# Patient Record
Sex: Female | Born: 1940 | Race: Black or African American | Hispanic: No | Marital: Single | Smoking: Former smoker
Health system: Southern US, Community
[De-identification: ages and names within clinical notes are randomized; demographics above are authoritative.]

## PROBLEM LIST (undated history)

## (undated) DIAGNOSIS — E119 Type 2 diabetes mellitus without complications: Secondary | ICD-10-CM

## (undated) DIAGNOSIS — K219 Gastro-esophageal reflux disease without esophagitis: Secondary | ICD-10-CM

## (undated) DIAGNOSIS — I998 Other disorder of circulatory system: Secondary | ICD-10-CM

## (undated) DIAGNOSIS — E039 Hypothyroidism, unspecified: Secondary | ICD-10-CM

## (undated) DIAGNOSIS — G2581 Restless legs syndrome: Secondary | ICD-10-CM

## (undated) DIAGNOSIS — R002 Palpitations: Secondary | ICD-10-CM

## (undated) DIAGNOSIS — M109 Gout, unspecified: Secondary | ICD-10-CM

## (undated) DIAGNOSIS — I70229 Atherosclerosis of native arteries of extremities with rest pain, unspecified extremity: Secondary | ICD-10-CM

## (undated) DIAGNOSIS — F17200 Nicotine dependence, unspecified, uncomplicated: Secondary | ICD-10-CM

## (undated) DIAGNOSIS — M949 Disorder of cartilage, unspecified: Secondary | ICD-10-CM

## (undated) DIAGNOSIS — I10 Essential (primary) hypertension: Secondary | ICD-10-CM

## (undated) DIAGNOSIS — D649 Anemia, unspecified: Secondary | ICD-10-CM

## (undated) DIAGNOSIS — Z86010 Personal history of colonic polyps: Secondary | ICD-10-CM

## (undated) DIAGNOSIS — G8929 Other chronic pain: Secondary | ICD-10-CM

## (undated) DIAGNOSIS — M545 Low back pain, unspecified: Secondary | ICD-10-CM

## (undated) DIAGNOSIS — M542 Cervicalgia: Secondary | ICD-10-CM

## (undated) DIAGNOSIS — J3800 Paralysis of vocal cords and larynx, unspecified: Secondary | ICD-10-CM

## (undated) DIAGNOSIS — T4145XA Adverse effect of unspecified anesthetic, initial encounter: Secondary | ICD-10-CM

## (undated) DIAGNOSIS — F329 Major depressive disorder, single episode, unspecified: Secondary | ICD-10-CM

## (undated) DIAGNOSIS — R413 Other amnesia: Secondary | ICD-10-CM

## (undated) DIAGNOSIS — I739 Peripheral vascular disease, unspecified: Secondary | ICD-10-CM

## (undated) DIAGNOSIS — Z992 Dependence on renal dialysis: Secondary | ICD-10-CM

## (undated) DIAGNOSIS — R569 Unspecified convulsions: Secondary | ICD-10-CM

## (undated) DIAGNOSIS — N186 End stage renal disease: Secondary | ICD-10-CM

## (undated) DIAGNOSIS — T8859XA Other complications of anesthesia, initial encounter: Secondary | ICD-10-CM

## (undated) DIAGNOSIS — N289 Disorder of kidney and ureter, unspecified: Secondary | ICD-10-CM

## (undated) DIAGNOSIS — J42 Unspecified chronic bronchitis: Secondary | ICD-10-CM

## (undated) DIAGNOSIS — F411 Generalized anxiety disorder: Secondary | ICD-10-CM

## (undated) DIAGNOSIS — E785 Hyperlipidemia, unspecified: Secondary | ICD-10-CM

## (undated) DIAGNOSIS — R011 Cardiac murmur, unspecified: Secondary | ICD-10-CM

## (undated) DIAGNOSIS — K279 Peptic ulcer, site unspecified, unspecified as acute or chronic, without hemorrhage or perforation: Secondary | ICD-10-CM

## (undated) DIAGNOSIS — M5430 Sciatica, unspecified side: Principal | ICD-10-CM

## (undated) DIAGNOSIS — G609 Hereditary and idiopathic neuropathy, unspecified: Secondary | ICD-10-CM

## (undated) DIAGNOSIS — C649 Malignant neoplasm of unspecified kidney, except renal pelvis: Secondary | ICD-10-CM

## (undated) DIAGNOSIS — G43009 Migraine without aura, not intractable, without status migrainosus: Secondary | ICD-10-CM

## (undated) DIAGNOSIS — M899 Disorder of bone, unspecified: Secondary | ICD-10-CM

## (undated) DIAGNOSIS — M1990 Unspecified osteoarthritis, unspecified site: Secondary | ICD-10-CM

## (undated) HISTORY — DX: Nicotine dependence, unspecified, uncomplicated: F17.200

## (undated) HISTORY — DX: Hyperlipidemia, unspecified: E78.5

## (undated) HISTORY — DX: Cervicalgia: M54.2

## (undated) HISTORY — DX: Hypothyroidism, unspecified: E03.9

## (undated) HISTORY — PX: LAPAROSCOPIC CHOLECYSTECTOMY: SUR755

## (undated) HISTORY — DX: Gout, unspecified: M10.9

## (undated) HISTORY — DX: Major depressive disorder, single episode, unspecified: F32.9

## (undated) HISTORY — PX: SHOULDER OPEN ROTATOR CUFF REPAIR: SHX2407

## (undated) HISTORY — PX: CATARACT EXTRACTION, BILATERAL: SHX1313

## (undated) HISTORY — DX: Palpitations: R00.2

## (undated) HISTORY — DX: Generalized anxiety disorder: F41.1

## (undated) HISTORY — DX: Migraine without aura, not intractable, without status migrainosus: G43.009

## (undated) HISTORY — DX: Other disorder of circulatory system: I99.8

## (undated) HISTORY — DX: Other chronic pain: G89.29

## (undated) HISTORY — DX: Personal history of colonic polyps: Z86.010

## (undated) HISTORY — DX: Malignant neoplasm of unspecified kidney, except renal pelvis: C64.9

## (undated) HISTORY — DX: Disorder of cartilage, unspecified: M94.9

## (undated) HISTORY — DX: Peptic ulcer, site unspecified, unspecified as acute or chronic, without hemorrhage or perforation: K27.9

## (undated) HISTORY — DX: Disorder of bone, unspecified: M89.9

## (undated) HISTORY — DX: Peripheral vascular disease, unspecified: I73.9

## (undated) HISTORY — DX: Unspecified convulsions: R56.9

## (undated) HISTORY — DX: Restless legs syndrome: G25.81

## (undated) HISTORY — DX: Other amnesia: R41.3

## (undated) HISTORY — DX: Hereditary and idiopathic neuropathy, unspecified: G60.9

## (undated) HISTORY — DX: Anemia, unspecified: D64.9

## (undated) HISTORY — DX: Sciatica, unspecified side: M54.30

## (undated) HISTORY — DX: Atherosclerosis of native arteries of extremities with rest pain, unspecified extremity: I70.229

## (undated) HISTORY — DX: Essential (primary) hypertension: I10

---

## 1978-08-06 HISTORY — PX: BUNIONECTOMY: SHX129

## 1994-08-06 DIAGNOSIS — J3800 Paralysis of vocal cords and larynx, unspecified: Secondary | ICD-10-CM

## 1994-08-06 HISTORY — DX: Paralysis of vocal cords and larynx, unspecified: J38.00

## 1995-08-07 HISTORY — PX: THYROID SURGERY: SHX805

## 1997-12-04 ENCOUNTER — Inpatient Hospital Stay (HOSPITAL_COMMUNITY): Admission: EM | Admit: 1997-12-04 | Discharge: 1997-12-10 | Payer: Self-pay | Admitting: Emergency Medicine

## 1998-01-05 ENCOUNTER — Encounter: Admission: RE | Admit: 1998-01-05 | Discharge: 1998-04-05 | Payer: Self-pay | Admitting: Cardiology

## 1998-02-14 ENCOUNTER — Emergency Department (HOSPITAL_COMMUNITY): Admission: EM | Admit: 1998-02-14 | Discharge: 1998-02-14 | Payer: Self-pay | Admitting: Emergency Medicine

## 1998-04-18 ENCOUNTER — Inpatient Hospital Stay (HOSPITAL_COMMUNITY): Admission: EM | Admit: 1998-04-18 | Discharge: 1998-04-24 | Payer: Self-pay | Admitting: *Deleted

## 1998-04-18 ENCOUNTER — Encounter: Payer: Self-pay | Admitting: *Deleted

## 1998-04-19 ENCOUNTER — Encounter: Payer: Self-pay | Admitting: Cardiology

## 1998-05-02 ENCOUNTER — Encounter: Payer: Self-pay | Admitting: Cardiology

## 1998-05-03 ENCOUNTER — Inpatient Hospital Stay (HOSPITAL_COMMUNITY): Admission: RE | Admit: 1998-05-03 | Discharge: 1998-05-06 | Payer: Self-pay | Admitting: Cardiology

## 1998-05-19 ENCOUNTER — Ambulatory Visit (HOSPITAL_COMMUNITY): Admission: RE | Admit: 1998-05-19 | Discharge: 1998-05-20 | Payer: Self-pay | Admitting: General Surgery

## 1998-11-28 ENCOUNTER — Emergency Department (HOSPITAL_COMMUNITY): Admission: EM | Admit: 1998-11-28 | Discharge: 1998-11-28 | Payer: Self-pay | Admitting: *Deleted

## 1998-11-29 ENCOUNTER — Encounter: Payer: Self-pay | Admitting: *Deleted

## 1999-04-22 ENCOUNTER — Encounter: Payer: Self-pay | Admitting: Cardiology

## 1999-04-22 ENCOUNTER — Ambulatory Visit (HOSPITAL_COMMUNITY): Admission: RE | Admit: 1999-04-22 | Discharge: 1999-04-22 | Payer: Self-pay | Admitting: Cardiology

## 1999-05-18 ENCOUNTER — Ambulatory Visit (HOSPITAL_BASED_OUTPATIENT_CLINIC_OR_DEPARTMENT_OTHER): Admission: RE | Admit: 1999-05-18 | Discharge: 1999-05-18 | Payer: Self-pay | Admitting: Orthopedic Surgery

## 1999-05-18 ENCOUNTER — Encounter: Payer: Self-pay | Admitting: Orthopedic Surgery

## 1999-05-19 ENCOUNTER — Inpatient Hospital Stay (HOSPITAL_COMMUNITY): Admission: RE | Admit: 1999-05-19 | Discharge: 1999-05-21 | Payer: Self-pay | Admitting: Orthopedic Surgery

## 1999-06-20 ENCOUNTER — Encounter: Admission: RE | Admit: 1999-06-20 | Discharge: 1999-08-28 | Payer: Self-pay | Admitting: Orthopedic Surgery

## 2000-08-10 ENCOUNTER — Ambulatory Visit (HOSPITAL_COMMUNITY): Admission: RE | Admit: 2000-08-10 | Discharge: 2000-08-10 | Payer: Self-pay | Admitting: Orthopedic Surgery

## 2000-08-10 ENCOUNTER — Encounter: Payer: Self-pay | Admitting: Orthopedic Surgery

## 2000-09-10 ENCOUNTER — Encounter: Admission: RE | Admit: 2000-09-10 | Discharge: 2000-09-30 | Payer: Self-pay | Admitting: Orthopedic Surgery

## 2000-12-17 ENCOUNTER — Encounter: Admission: RE | Admit: 2000-12-17 | Discharge: 2001-03-05 | Payer: Self-pay | Admitting: Orthopedic Surgery

## 2001-01-08 ENCOUNTER — Encounter: Payer: Self-pay | Admitting: *Deleted

## 2001-01-08 ENCOUNTER — Encounter: Admission: RE | Admit: 2001-01-08 | Discharge: 2001-01-08 | Payer: Self-pay | Admitting: *Deleted

## 2001-07-09 ENCOUNTER — Encounter: Admission: RE | Admit: 2001-07-09 | Discharge: 2001-10-07 | Payer: Self-pay | Admitting: Internal Medicine

## 2001-12-01 ENCOUNTER — Encounter (HOSPITAL_BASED_OUTPATIENT_CLINIC_OR_DEPARTMENT_OTHER): Admission: RE | Admit: 2001-12-01 | Discharge: 2001-12-05 | Payer: Self-pay | Admitting: Orthopedic Surgery

## 2002-05-05 ENCOUNTER — Encounter: Admission: RE | Admit: 2002-05-05 | Discharge: 2002-05-05 | Payer: Self-pay | Admitting: Cardiology

## 2002-05-05 ENCOUNTER — Encounter: Payer: Self-pay | Admitting: Cardiology

## 2002-05-26 ENCOUNTER — Encounter (HOSPITAL_BASED_OUTPATIENT_CLINIC_OR_DEPARTMENT_OTHER): Admission: RE | Admit: 2002-05-26 | Discharge: 2002-08-24 | Payer: Self-pay | Admitting: Internal Medicine

## 2002-08-17 ENCOUNTER — Encounter: Admission: RE | Admit: 2002-08-17 | Discharge: 2002-11-15 | Payer: Self-pay

## 2002-11-16 ENCOUNTER — Encounter (HOSPITAL_BASED_OUTPATIENT_CLINIC_OR_DEPARTMENT_OTHER): Admission: RE | Admit: 2002-11-16 | Discharge: 2003-02-14 | Payer: Self-pay | Admitting: Internal Medicine

## 2003-01-12 ENCOUNTER — Emergency Department (HOSPITAL_COMMUNITY): Admission: EM | Admit: 2003-01-12 | Discharge: 2003-01-12 | Payer: Self-pay

## 2004-01-07 ENCOUNTER — Encounter (HOSPITAL_BASED_OUTPATIENT_CLINIC_OR_DEPARTMENT_OTHER): Admission: RE | Admit: 2004-01-07 | Discharge: 2004-04-06 | Payer: Self-pay | Admitting: Internal Medicine

## 2004-01-18 ENCOUNTER — Ambulatory Visit (HOSPITAL_COMMUNITY): Admission: RE | Admit: 2004-01-18 | Discharge: 2004-01-18 | Payer: Self-pay | Admitting: *Deleted

## 2004-01-21 ENCOUNTER — Ambulatory Visit (HOSPITAL_COMMUNITY): Admission: RE | Admit: 2004-01-21 | Discharge: 2004-01-21 | Payer: Self-pay | Admitting: *Deleted

## 2004-02-22 ENCOUNTER — Encounter: Admission: RE | Admit: 2004-02-22 | Discharge: 2004-02-22 | Payer: Self-pay | Admitting: *Deleted

## 2004-06-07 ENCOUNTER — Encounter: Admission: RE | Admit: 2004-06-07 | Discharge: 2004-06-07 | Payer: Self-pay | Admitting: Cardiology

## 2004-08-06 HISTORY — PX: TOE AMPUTATION: SHX809

## 2004-08-09 ENCOUNTER — Encounter (HOSPITAL_BASED_OUTPATIENT_CLINIC_OR_DEPARTMENT_OTHER): Admission: RE | Admit: 2004-08-09 | Discharge: 2004-08-29 | Payer: Self-pay | Admitting: Internal Medicine

## 2004-09-25 ENCOUNTER — Encounter: Admission: RE | Admit: 2004-09-25 | Discharge: 2004-11-27 | Payer: Self-pay | Admitting: Unknown Physician Specialty

## 2004-12-20 ENCOUNTER — Encounter (HOSPITAL_BASED_OUTPATIENT_CLINIC_OR_DEPARTMENT_OTHER): Admission: RE | Admit: 2004-12-20 | Discharge: 2005-03-13 | Payer: Self-pay | Admitting: Surgery

## 2005-01-04 ENCOUNTER — Encounter: Payer: Self-pay | Admitting: Internal Medicine

## 2005-04-19 ENCOUNTER — Other Ambulatory Visit: Admission: RE | Admit: 2005-04-19 | Discharge: 2005-04-19 | Payer: Self-pay | Admitting: Obstetrics and Gynecology

## 2005-05-08 ENCOUNTER — Inpatient Hospital Stay (HOSPITAL_COMMUNITY): Admission: AD | Admit: 2005-05-08 | Discharge: 2005-05-23 | Payer: Self-pay | Admitting: Cardiology

## 2005-05-15 ENCOUNTER — Encounter (INDEPENDENT_AMBULATORY_CARE_PROVIDER_SITE_OTHER): Payer: Self-pay | Admitting: Cardiovascular Disease

## 2005-05-23 ENCOUNTER — Inpatient Hospital Stay
Admission: RE | Admit: 2005-05-23 | Discharge: 2005-05-31 | Payer: Self-pay | Admitting: Physical Medicine & Rehabilitation

## 2005-08-16 ENCOUNTER — Ambulatory Visit (HOSPITAL_COMMUNITY): Admission: RE | Admit: 2005-08-16 | Discharge: 2005-08-16 | Payer: Self-pay | Admitting: Cardiology

## 2005-08-30 ENCOUNTER — Encounter: Admission: RE | Admit: 2005-08-30 | Discharge: 2005-08-30 | Payer: Self-pay | Admitting: Cardiology

## 2005-09-20 ENCOUNTER — Ambulatory Visit: Payer: Self-pay | Admitting: Internal Medicine

## 2005-10-04 ENCOUNTER — Ambulatory Visit (HOSPITAL_COMMUNITY): Admission: RE | Admit: 2005-10-04 | Discharge: 2005-10-04 | Payer: Self-pay | Admitting: Gastroenterology

## 2005-10-16 ENCOUNTER — Ambulatory Visit: Payer: Self-pay | Admitting: Internal Medicine

## 2005-11-21 ENCOUNTER — Ambulatory Visit: Payer: Self-pay | Admitting: Internal Medicine

## 2005-11-24 ENCOUNTER — Inpatient Hospital Stay (HOSPITAL_COMMUNITY): Admission: EM | Admit: 2005-11-24 | Discharge: 2005-11-28 | Payer: Self-pay | Admitting: Internal Medicine

## 2005-11-24 ENCOUNTER — Ambulatory Visit: Payer: Self-pay | Admitting: Internal Medicine

## 2005-12-04 ENCOUNTER — Ambulatory Visit: Payer: Self-pay | Admitting: Internal Medicine

## 2005-12-05 ENCOUNTER — Encounter: Payer: Self-pay | Admitting: Cardiology

## 2005-12-05 ENCOUNTER — Encounter (HOSPITAL_BASED_OUTPATIENT_CLINIC_OR_DEPARTMENT_OTHER): Payer: Self-pay | Admitting: General Surgery

## 2006-01-05 ENCOUNTER — Ambulatory Visit: Payer: Self-pay | Admitting: Family Medicine

## 2006-01-07 ENCOUNTER — Ambulatory Visit: Payer: Self-pay | Admitting: Internal Medicine

## 2006-01-10 ENCOUNTER — Ambulatory Visit: Payer: Self-pay | Admitting: Internal Medicine

## 2006-02-19 ENCOUNTER — Ambulatory Visit: Payer: Self-pay | Admitting: Internal Medicine

## 2006-02-24 ENCOUNTER — Encounter: Admission: RE | Admit: 2006-02-24 | Discharge: 2006-02-24 | Payer: Self-pay | Admitting: Internal Medicine

## 2006-03-19 ENCOUNTER — Ambulatory Visit: Payer: Self-pay | Admitting: Internal Medicine

## 2006-04-12 ENCOUNTER — Encounter (HOSPITAL_BASED_OUTPATIENT_CLINIC_OR_DEPARTMENT_OTHER): Admission: RE | Admit: 2006-04-12 | Discharge: 2006-05-02 | Payer: Self-pay | Admitting: Internal Medicine

## 2006-05-20 ENCOUNTER — Ambulatory Visit: Payer: Self-pay | Admitting: Internal Medicine

## 2006-05-27 ENCOUNTER — Other Ambulatory Visit: Admission: RE | Admit: 2006-05-27 | Discharge: 2006-05-27 | Payer: Self-pay | Admitting: Obstetrics and Gynecology

## 2006-05-29 ENCOUNTER — Ambulatory Visit: Payer: Self-pay | Admitting: Cardiology

## 2006-05-29 HISTORY — PX: ELECTROCARDIOGRAM: SHX264

## 2006-06-03 ENCOUNTER — Ambulatory Visit: Payer: Self-pay | Admitting: Cardiology

## 2006-06-11 ENCOUNTER — Encounter: Admission: RE | Admit: 2006-06-11 | Discharge: 2006-06-11 | Payer: Self-pay | Admitting: Nephrology

## 2006-06-12 ENCOUNTER — Ambulatory Visit: Payer: Self-pay | Admitting: Internal Medicine

## 2006-06-14 ENCOUNTER — Encounter: Admission: RE | Admit: 2006-06-14 | Discharge: 2006-06-14 | Payer: Self-pay | Admitting: Internal Medicine

## 2006-06-18 ENCOUNTER — Ambulatory Visit: Payer: Self-pay

## 2006-06-18 ENCOUNTER — Encounter: Payer: Self-pay | Admitting: Internal Medicine

## 2006-06-18 HISTORY — PX: OTHER SURGICAL HISTORY: SHX169

## 2006-07-03 ENCOUNTER — Ambulatory Visit: Payer: Self-pay | Admitting: Cardiology

## 2006-10-16 ENCOUNTER — Ambulatory Visit: Payer: Self-pay | Admitting: Internal Medicine

## 2006-10-16 LAB — CONVERTED CEMR LAB
ALT: 22 units/L (ref 0–40)
AST: 25 units/L (ref 0–37)
Bilirubin, Direct: 0.1 mg/dL (ref 0.0–0.3)
CO2: 32 meq/L (ref 19–32)
Calcium: 9.5 mg/dL (ref 8.4–10.5)
Chloride: 93 meq/L — ABNORMAL LOW (ref 96–112)
Eosinophils Absolute: 0.1 10*3/uL (ref 0.0–0.6)
Eosinophils Relative: 2.1 % (ref 0.0–5.0)
Glucose, Bld: 98 mg/dL (ref 70–99)
HCT: 36.7 % (ref 36.0–46.0)
Hemoglobin, Urine: NEGATIVE
Hemoglobin: 12.5 g/dL (ref 12.0–15.0)
MCV: 92.5 fL (ref 78.0–100.0)
Mucus, UA: NEGATIVE
Neutrophils Relative %: 47.8 % (ref 43.0–77.0)
RBC: 3.97 M/uL (ref 3.87–5.11)
Total Protein, Urine: NEGATIVE mg/dL
Total Protein: 7.4 g/dL (ref 6.0–8.3)
Triglycerides: 273 mg/dL (ref 0–149)
WBC: 6.9 10*3/uL (ref 4.5–10.5)

## 2006-10-24 ENCOUNTER — Ambulatory Visit: Payer: Self-pay | Admitting: Internal Medicine

## 2006-12-12 ENCOUNTER — Ambulatory Visit: Payer: Self-pay | Admitting: Internal Medicine

## 2007-01-23 ENCOUNTER — Inpatient Hospital Stay (HOSPITAL_COMMUNITY): Admission: RE | Admit: 2007-01-23 | Discharge: 2007-01-28 | Payer: Self-pay | Admitting: Orthopedic Surgery

## 2007-01-23 ENCOUNTER — Encounter (INDEPENDENT_AMBULATORY_CARE_PROVIDER_SITE_OTHER): Payer: Self-pay | Admitting: Orthopedic Surgery

## 2007-01-24 ENCOUNTER — Ambulatory Visit: Payer: Self-pay | Admitting: Physical Medicine & Rehabilitation

## 2007-02-17 ENCOUNTER — Encounter: Payer: Self-pay | Admitting: Internal Medicine

## 2007-02-17 DIAGNOSIS — F3289 Other specified depressive episodes: Secondary | ICD-10-CM

## 2007-02-17 DIAGNOSIS — E039 Hypothyroidism, unspecified: Secondary | ICD-10-CM

## 2007-02-17 DIAGNOSIS — R7302 Impaired glucose tolerance (oral): Secondary | ICD-10-CM

## 2007-02-17 DIAGNOSIS — N186 End stage renal disease: Secondary | ICD-10-CM

## 2007-02-17 DIAGNOSIS — E119 Type 2 diabetes mellitus without complications: Secondary | ICD-10-CM

## 2007-02-17 DIAGNOSIS — J309 Allergic rhinitis, unspecified: Secondary | ICD-10-CM | POA: Insufficient documentation

## 2007-02-17 DIAGNOSIS — F329 Major depressive disorder, single episode, unspecified: Secondary | ICD-10-CM

## 2007-02-17 DIAGNOSIS — R569 Unspecified convulsions: Secondary | ICD-10-CM

## 2007-02-17 DIAGNOSIS — K219 Gastro-esophageal reflux disease without esophagitis: Secondary | ICD-10-CM

## 2007-02-17 DIAGNOSIS — I10 Essential (primary) hypertension: Secondary | ICD-10-CM

## 2007-02-17 DIAGNOSIS — E785 Hyperlipidemia, unspecified: Secondary | ICD-10-CM

## 2007-02-17 DIAGNOSIS — N259 Disorder resulting from impaired renal tubular function, unspecified: Secondary | ICD-10-CM | POA: Insufficient documentation

## 2007-02-17 DIAGNOSIS — I739 Peripheral vascular disease, unspecified: Secondary | ICD-10-CM

## 2007-02-17 DIAGNOSIS — Z992 Dependence on renal dialysis: Secondary | ICD-10-CM

## 2007-02-17 HISTORY — DX: Dependence on renal dialysis: N18.6

## 2007-02-17 HISTORY — DX: Major depressive disorder, single episode, unspecified: F32.9

## 2007-02-17 HISTORY — DX: Unspecified convulsions: R56.9

## 2007-02-17 HISTORY — DX: Hyperlipidemia, unspecified: E78.5

## 2007-02-17 HISTORY — DX: Essential (primary) hypertension: I10

## 2007-02-17 HISTORY — DX: Other specified depressive episodes: F32.89

## 2007-02-17 HISTORY — DX: Hypothyroidism, unspecified: E03.9

## 2007-02-17 HISTORY — DX: Peripheral vascular disease, unspecified: I73.9

## 2007-02-17 HISTORY — DX: End stage renal disease: Z99.2

## 2007-02-17 HISTORY — DX: Type 2 diabetes mellitus without complications: E11.9

## 2007-02-26 ENCOUNTER — Ambulatory Visit: Payer: Self-pay | Admitting: Internal Medicine

## 2007-02-26 ENCOUNTER — Inpatient Hospital Stay (HOSPITAL_COMMUNITY): Admission: EM | Admit: 2007-02-26 | Discharge: 2007-03-02 | Payer: Self-pay | Admitting: *Deleted

## 2007-02-27 ENCOUNTER — Ambulatory Visit: Payer: Self-pay | Admitting: Internal Medicine

## 2007-02-28 ENCOUNTER — Ambulatory Visit: Payer: Self-pay | Admitting: Vascular Surgery

## 2007-02-28 ENCOUNTER — Encounter: Payer: Self-pay | Admitting: Internal Medicine

## 2007-03-06 ENCOUNTER — Ambulatory Visit: Payer: Self-pay | Admitting: Internal Medicine

## 2007-04-25 ENCOUNTER — Ambulatory Visit: Payer: Self-pay | Admitting: Internal Medicine

## 2007-05-08 ENCOUNTER — Ambulatory Visit: Payer: Self-pay | Admitting: Psychiatry

## 2007-05-19 ENCOUNTER — Encounter: Admission: RE | Admit: 2007-05-19 | Discharge: 2007-06-27 | Payer: Self-pay | Admitting: Orthopedic Surgery

## 2007-05-21 ENCOUNTER — Ambulatory Visit: Payer: Self-pay | Admitting: Psychiatry

## 2007-05-22 ENCOUNTER — Telehealth (INDEPENDENT_AMBULATORY_CARE_PROVIDER_SITE_OTHER): Payer: Self-pay | Admitting: *Deleted

## 2007-05-28 ENCOUNTER — Ambulatory Visit: Payer: Self-pay | Admitting: Psychiatry

## 2007-05-29 ENCOUNTER — Ambulatory Visit: Payer: Self-pay | Admitting: Internal Medicine

## 2007-05-29 ENCOUNTER — Encounter: Payer: Self-pay | Admitting: Internal Medicine

## 2007-05-29 DIAGNOSIS — G2581 Restless legs syndrome: Secondary | ICD-10-CM

## 2007-05-29 DIAGNOSIS — G609 Hereditary and idiopathic neuropathy, unspecified: Secondary | ICD-10-CM | POA: Insufficient documentation

## 2007-05-29 DIAGNOSIS — D649 Anemia, unspecified: Secondary | ICD-10-CM

## 2007-05-29 DIAGNOSIS — J4489 Other specified chronic obstructive pulmonary disease: Secondary | ICD-10-CM | POA: Insufficient documentation

## 2007-05-29 DIAGNOSIS — M109 Gout, unspecified: Secondary | ICD-10-CM

## 2007-05-29 DIAGNOSIS — K279 Peptic ulcer, site unspecified, unspecified as acute or chronic, without hemorrhage or perforation: Secondary | ICD-10-CM

## 2007-05-29 DIAGNOSIS — G43009 Migraine without aura, not intractable, without status migrainosus: Secondary | ICD-10-CM | POA: Insufficient documentation

## 2007-05-29 DIAGNOSIS — J449 Chronic obstructive pulmonary disease, unspecified: Secondary | ICD-10-CM

## 2007-05-29 HISTORY — DX: Hereditary and idiopathic neuropathy, unspecified: G60.9

## 2007-05-29 HISTORY — DX: Gout, unspecified: M10.9

## 2007-05-29 HISTORY — DX: Peptic ulcer, site unspecified, unspecified as acute or chronic, without hemorrhage or perforation: K27.9

## 2007-05-29 HISTORY — DX: Anemia, unspecified: D64.9

## 2007-05-29 HISTORY — DX: Restless legs syndrome: G25.81

## 2007-05-30 ENCOUNTER — Telehealth: Payer: Self-pay | Admitting: Internal Medicine

## 2007-07-30 ENCOUNTER — Ambulatory Visit: Payer: Self-pay | Admitting: Internal Medicine

## 2007-07-30 ENCOUNTER — Telehealth (INDEPENDENT_AMBULATORY_CARE_PROVIDER_SITE_OTHER): Payer: Self-pay | Admitting: *Deleted

## 2007-07-30 DIAGNOSIS — R509 Fever, unspecified: Secondary | ICD-10-CM

## 2007-07-30 DIAGNOSIS — R269 Unspecified abnormalities of gait and mobility: Secondary | ICD-10-CM

## 2007-09-01 ENCOUNTER — Encounter: Payer: Self-pay | Admitting: Internal Medicine

## 2007-09-01 ENCOUNTER — Encounter: Admission: RE | Admit: 2007-09-01 | Discharge: 2007-10-15 | Payer: Self-pay | Admitting: Internal Medicine

## 2007-09-01 ENCOUNTER — Telehealth (INDEPENDENT_AMBULATORY_CARE_PROVIDER_SITE_OTHER): Payer: Self-pay | Admitting: *Deleted

## 2007-09-17 ENCOUNTER — Ambulatory Visit: Payer: Self-pay | Admitting: Internal Medicine

## 2007-09-17 ENCOUNTER — Ambulatory Visit: Payer: Self-pay | Admitting: Psychiatry

## 2007-09-17 DIAGNOSIS — R5381 Other malaise: Secondary | ICD-10-CM

## 2007-09-17 DIAGNOSIS — R5383 Other fatigue: Secondary | ICD-10-CM

## 2007-09-17 DIAGNOSIS — F17200 Nicotine dependence, unspecified, uncomplicated: Secondary | ICD-10-CM | POA: Insufficient documentation

## 2007-09-17 HISTORY — DX: Nicotine dependence, unspecified, uncomplicated: F17.200

## 2007-09-18 ENCOUNTER — Telehealth (INDEPENDENT_AMBULATORY_CARE_PROVIDER_SITE_OTHER): Payer: Self-pay | Admitting: *Deleted

## 2007-09-19 ENCOUNTER — Telehealth (INDEPENDENT_AMBULATORY_CARE_PROVIDER_SITE_OTHER): Payer: Self-pay | Admitting: *Deleted

## 2007-09-22 ENCOUNTER — Telehealth: Payer: Self-pay | Admitting: Internal Medicine

## 2007-09-25 ENCOUNTER — Telehealth (INDEPENDENT_AMBULATORY_CARE_PROVIDER_SITE_OTHER): Payer: Self-pay | Admitting: *Deleted

## 2007-10-03 ENCOUNTER — Telehealth: Payer: Self-pay | Admitting: Internal Medicine

## 2007-10-03 LAB — CONVERTED CEMR LAB
ALT: 25 units/L (ref 0–35)
AST: 26 units/L (ref 0–37)
Albumin: 4.3 g/dL (ref 3.5–5.2)
Alkaline Phosphatase: 71 units/L (ref 39–117)
BUN: 51 mg/dL — ABNORMAL HIGH (ref 6–23)
Bilirubin, Direct: 0.1 mg/dL (ref 0.0–0.3)
Calcium: 9.4 mg/dL (ref 8.4–10.5)
Chloride: 95 meq/L — ABNORMAL LOW (ref 96–112)
Eosinophils Absolute: 0 10*3/uL (ref 0.0–0.6)
Eosinophils Relative: 0.1 % (ref 0.0–5.0)
GFR calc non Af Amer: 13 mL/min
Glucose, Bld: 104 mg/dL — ABNORMAL HIGH (ref 70–99)
MCV: 95.8 fL (ref 78.0–100.0)
Platelets: 291 10*3/uL (ref 150–400)
RBC: 3.71 M/uL — ABNORMAL LOW (ref 3.87–5.11)
Total CHOL/HDL Ratio: 2.8
Triglycerides: 195 mg/dL — ABNORMAL HIGH (ref 0–149)
VLDL: 39 mg/dL (ref 0–40)
WBC: 15 10*3/uL — ABNORMAL HIGH (ref 4.5–10.5)

## 2007-10-07 ENCOUNTER — Encounter: Payer: Self-pay | Admitting: Internal Medicine

## 2007-10-15 ENCOUNTER — Encounter: Payer: Self-pay | Admitting: Internal Medicine

## 2007-10-16 ENCOUNTER — Telehealth (INDEPENDENT_AMBULATORY_CARE_PROVIDER_SITE_OTHER): Payer: Self-pay | Admitting: *Deleted

## 2007-10-17 ENCOUNTER — Telehealth: Payer: Self-pay | Admitting: Internal Medicine

## 2007-10-22 ENCOUNTER — Encounter: Admission: RE | Admit: 2007-10-22 | Discharge: 2007-10-22 | Payer: Self-pay | Admitting: Orthopaedic Surgery

## 2007-10-22 ENCOUNTER — Encounter: Payer: Self-pay | Admitting: Internal Medicine

## 2007-11-07 ENCOUNTER — Encounter: Payer: Self-pay | Admitting: Internal Medicine

## 2007-11-10 ENCOUNTER — Encounter: Payer: Self-pay | Admitting: Internal Medicine

## 2007-11-24 ENCOUNTER — Telehealth: Payer: Self-pay | Admitting: Internal Medicine

## 2007-12-02 ENCOUNTER — Ambulatory Visit: Payer: Self-pay | Admitting: Internal Medicine

## 2007-12-02 DIAGNOSIS — K5289 Other specified noninfective gastroenteritis and colitis: Secondary | ICD-10-CM | POA: Insufficient documentation

## 2007-12-04 ENCOUNTER — Encounter: Payer: Self-pay | Admitting: Internal Medicine

## 2007-12-10 ENCOUNTER — Encounter: Payer: Self-pay | Admitting: Internal Medicine

## 2007-12-11 ENCOUNTER — Ambulatory Visit: Payer: Self-pay | Admitting: Psychiatry

## 2008-01-01 ENCOUNTER — Telehealth (INDEPENDENT_AMBULATORY_CARE_PROVIDER_SITE_OTHER): Payer: Self-pay | Admitting: *Deleted

## 2008-01-15 ENCOUNTER — Encounter: Payer: Self-pay | Admitting: Internal Medicine

## 2008-01-27 ENCOUNTER — Encounter: Payer: Self-pay | Admitting: Internal Medicine

## 2008-01-29 ENCOUNTER — Encounter: Payer: Self-pay | Admitting: Internal Medicine

## 2008-02-05 ENCOUNTER — Telehealth (INDEPENDENT_AMBULATORY_CARE_PROVIDER_SITE_OTHER): Payer: Self-pay | Admitting: *Deleted

## 2008-02-16 ENCOUNTER — Telehealth: Payer: Self-pay | Admitting: Internal Medicine

## 2008-02-17 ENCOUNTER — Encounter: Payer: Self-pay | Admitting: Internal Medicine

## 2008-03-02 ENCOUNTER — Telehealth (INDEPENDENT_AMBULATORY_CARE_PROVIDER_SITE_OTHER): Payer: Self-pay | Admitting: *Deleted

## 2008-03-04 ENCOUNTER — Encounter: Payer: Self-pay | Admitting: Internal Medicine

## 2008-03-11 ENCOUNTER — Ambulatory Visit: Payer: Self-pay | Admitting: Internal Medicine

## 2008-03-11 DIAGNOSIS — M1710 Unilateral primary osteoarthritis, unspecified knee: Secondary | ICD-10-CM

## 2008-03-12 ENCOUNTER — Telehealth (INDEPENDENT_AMBULATORY_CARE_PROVIDER_SITE_OTHER): Payer: Self-pay | Admitting: *Deleted

## 2008-03-12 LAB — CONVERTED CEMR LAB
BUN: 36 mg/dL — ABNORMAL HIGH (ref 6–23)
Calcium: 9.7 mg/dL (ref 8.4–10.5)
Creatinine, Ser: 2.9 mg/dL — ABNORMAL HIGH (ref 0.4–1.2)
GFR calc Af Amer: 21 mL/min
GFR calc non Af Amer: 17 mL/min
HDL: 50.6 mg/dL (ref >39.0)
Hgb A1c MFr Bld: 6.5 % — ABNORMAL HIGH (ref 4.6–6.0)
LDL Cholesterol: 55 mg/dL (ref 0–99)
Total CHOL/HDL Ratio: 2.7
Triglycerides: 163 mg/dL — ABNORMAL HIGH (ref 0–149)
VLDL: 33 mg/dL (ref 0–40)

## 2008-03-15 ENCOUNTER — Telehealth (INDEPENDENT_AMBULATORY_CARE_PROVIDER_SITE_OTHER): Payer: Self-pay | Admitting: *Deleted

## 2008-03-22 ENCOUNTER — Encounter: Payer: Self-pay | Admitting: Internal Medicine

## 2008-03-26 ENCOUNTER — Telehealth: Payer: Self-pay | Admitting: Internal Medicine

## 2008-04-02 ENCOUNTER — Telehealth (INDEPENDENT_AMBULATORY_CARE_PROVIDER_SITE_OTHER): Payer: Self-pay | Admitting: *Deleted

## 2008-04-07 ENCOUNTER — Telehealth (INDEPENDENT_AMBULATORY_CARE_PROVIDER_SITE_OTHER): Payer: Self-pay | Admitting: *Deleted

## 2008-04-09 ENCOUNTER — Ambulatory Visit: Payer: Self-pay | Admitting: Internal Medicine

## 2008-04-16 ENCOUNTER — Telehealth (INDEPENDENT_AMBULATORY_CARE_PROVIDER_SITE_OTHER): Payer: Self-pay | Admitting: *Deleted

## 2008-05-14 ENCOUNTER — Encounter: Payer: Self-pay | Admitting: Internal Medicine

## 2008-05-26 ENCOUNTER — Encounter: Payer: Self-pay | Admitting: Internal Medicine

## 2008-05-27 ENCOUNTER — Ambulatory Visit: Payer: Self-pay | Admitting: Internal Medicine

## 2008-05-27 DIAGNOSIS — M5412 Radiculopathy, cervical region: Secondary | ICD-10-CM | POA: Insufficient documentation

## 2008-05-31 ENCOUNTER — Encounter: Payer: Self-pay | Admitting: Internal Medicine

## 2008-06-03 ENCOUNTER — Encounter: Admission: RE | Admit: 2008-06-03 | Discharge: 2008-06-03 | Payer: Self-pay | Admitting: Internal Medicine

## 2008-06-03 ENCOUNTER — Encounter: Payer: Self-pay | Admitting: Internal Medicine

## 2008-06-14 ENCOUNTER — Telehealth (INDEPENDENT_AMBULATORY_CARE_PROVIDER_SITE_OTHER): Payer: Self-pay | Admitting: *Deleted

## 2008-06-15 ENCOUNTER — Telehealth: Payer: Self-pay | Admitting: Internal Medicine

## 2008-06-27 ENCOUNTER — Encounter: Payer: Self-pay | Admitting: Internal Medicine

## 2008-07-05 ENCOUNTER — Telehealth: Payer: Self-pay | Admitting: Internal Medicine

## 2008-07-12 ENCOUNTER — Telehealth: Payer: Self-pay | Admitting: Internal Medicine

## 2008-07-23 ENCOUNTER — Ambulatory Visit: Payer: Self-pay | Admitting: Internal Medicine

## 2008-07-26 LAB — CONVERTED CEMR LAB
BUN: 51 mg/dL — ABNORMAL HIGH (ref 6–23)
Chloride: 97 meq/L (ref 96–112)
Direct LDL: 134.9 mg/dL
Glucose, Bld: 89 mg/dL (ref 70–99)
Potassium: 4 meq/L (ref 3.5–5.1)
Sodium: 139 meq/L (ref 135–145)

## 2008-08-03 ENCOUNTER — Encounter: Admission: RE | Admit: 2008-08-03 | Discharge: 2008-08-03 | Payer: Self-pay | Admitting: Internal Medicine

## 2008-08-09 ENCOUNTER — Encounter: Payer: Self-pay | Admitting: Internal Medicine

## 2008-08-10 ENCOUNTER — Encounter: Payer: Self-pay | Admitting: Internal Medicine

## 2008-08-10 ENCOUNTER — Telehealth: Payer: Self-pay | Admitting: Internal Medicine

## 2008-08-13 ENCOUNTER — Telehealth (INDEPENDENT_AMBULATORY_CARE_PROVIDER_SITE_OTHER): Payer: Self-pay | Admitting: *Deleted

## 2008-08-16 ENCOUNTER — Encounter: Payer: Self-pay | Admitting: Internal Medicine

## 2008-09-02 ENCOUNTER — Encounter: Payer: Self-pay | Admitting: Internal Medicine

## 2008-09-02 LAB — HM MAMMOGRAPHY: HM Mammogram: NORMAL

## 2008-09-15 ENCOUNTER — Ambulatory Visit: Payer: Self-pay | Admitting: Internal Medicine

## 2008-09-15 DIAGNOSIS — M25519 Pain in unspecified shoulder: Secondary | ICD-10-CM

## 2008-09-29 ENCOUNTER — Encounter: Payer: Self-pay | Admitting: Internal Medicine

## 2008-10-06 ENCOUNTER — Telehealth (INDEPENDENT_AMBULATORY_CARE_PROVIDER_SITE_OTHER): Payer: Self-pay | Admitting: *Deleted

## 2008-10-08 ENCOUNTER — Ambulatory Visit: Payer: Self-pay | Admitting: Internal Medicine

## 2008-10-08 DIAGNOSIS — J209 Acute bronchitis, unspecified: Secondary | ICD-10-CM

## 2008-11-01 ENCOUNTER — Telehealth (INDEPENDENT_AMBULATORY_CARE_PROVIDER_SITE_OTHER): Payer: Self-pay | Admitting: *Deleted

## 2008-11-04 ENCOUNTER — Telehealth (INDEPENDENT_AMBULATORY_CARE_PROVIDER_SITE_OTHER): Payer: Self-pay | Admitting: *Deleted

## 2008-11-19 ENCOUNTER — Telehealth (INDEPENDENT_AMBULATORY_CARE_PROVIDER_SITE_OTHER): Payer: Self-pay | Admitting: *Deleted

## 2008-12-21 ENCOUNTER — Encounter: Payer: Self-pay | Admitting: Internal Medicine

## 2008-12-27 ENCOUNTER — Encounter: Payer: Self-pay | Admitting: Internal Medicine

## 2009-01-12 ENCOUNTER — Telehealth: Payer: Self-pay | Admitting: Internal Medicine

## 2009-01-17 ENCOUNTER — Ambulatory Visit: Payer: Self-pay | Admitting: Internal Medicine

## 2009-01-18 ENCOUNTER — Telehealth (INDEPENDENT_AMBULATORY_CARE_PROVIDER_SITE_OTHER): Payer: Self-pay | Admitting: *Deleted

## 2009-01-20 ENCOUNTER — Telehealth: Payer: Self-pay | Admitting: Internal Medicine

## 2009-02-21 ENCOUNTER — Telehealth (INDEPENDENT_AMBULATORY_CARE_PROVIDER_SITE_OTHER): Payer: Self-pay | Admitting: *Deleted

## 2009-02-21 DIAGNOSIS — H9209 Otalgia, unspecified ear: Secondary | ICD-10-CM | POA: Insufficient documentation

## 2009-03-01 ENCOUNTER — Telehealth (INDEPENDENT_AMBULATORY_CARE_PROVIDER_SITE_OTHER): Payer: Self-pay | Admitting: *Deleted

## 2009-03-24 ENCOUNTER — Encounter: Payer: Self-pay | Admitting: Internal Medicine

## 2009-03-25 ENCOUNTER — Telehealth: Payer: Self-pay | Admitting: Internal Medicine

## 2009-05-10 ENCOUNTER — Encounter: Payer: Self-pay | Admitting: Internal Medicine

## 2009-05-11 ENCOUNTER — Telehealth: Payer: Self-pay | Admitting: Internal Medicine

## 2009-05-25 ENCOUNTER — Ambulatory Visit: Payer: Self-pay | Admitting: Internal Medicine

## 2009-05-25 DIAGNOSIS — R109 Unspecified abdominal pain: Secondary | ICD-10-CM

## 2009-05-25 DIAGNOSIS — N959 Unspecified menopausal and perimenopausal disorder: Secondary | ICD-10-CM | POA: Insufficient documentation

## 2009-06-02 ENCOUNTER — Telehealth: Payer: Self-pay | Admitting: Internal Medicine

## 2009-06-03 ENCOUNTER — Ambulatory Visit: Payer: Self-pay | Admitting: Internal Medicine

## 2009-06-03 ENCOUNTER — Ambulatory Visit: Payer: Self-pay | Admitting: Family Medicine

## 2009-06-03 LAB — CONVERTED CEMR LAB
CO2: 30 meq/L (ref 19–32)
Calcium: 8.9 mg/dL (ref 8.4–10.5)
GFR calc non Af Amer: 23.44 mL/min (ref >60)
Glucose, Bld: 79 mg/dL (ref 70–99)
HDL: 57.9 mg/dL (ref >39.00)
Hgb A1c MFr Bld: 6 % (ref 4.6–6.5)
Potassium: 3.7 meq/L (ref 3.5–5.1)
Sodium: 136 meq/L (ref 135–145)
Triglycerides: 93 mg/dL (ref 0.0–149.0)
VLDL: 18.6 mg/dL (ref 0.0–40.0)

## 2009-06-16 ENCOUNTER — Telehealth: Payer: Self-pay | Admitting: Internal Medicine

## 2009-07-07 ENCOUNTER — Encounter: Payer: Self-pay | Admitting: Internal Medicine

## 2009-08-22 ENCOUNTER — Telehealth: Payer: Self-pay | Admitting: Internal Medicine

## 2009-08-23 ENCOUNTER — Telehealth: Payer: Self-pay | Admitting: Internal Medicine

## 2009-08-30 ENCOUNTER — Telehealth: Payer: Self-pay | Admitting: Internal Medicine

## 2009-09-01 ENCOUNTER — Telehealth: Payer: Self-pay | Admitting: Internal Medicine

## 2009-09-08 ENCOUNTER — Telehealth: Payer: Self-pay | Admitting: Internal Medicine

## 2009-09-12 ENCOUNTER — Telehealth: Payer: Self-pay | Admitting: Internal Medicine

## 2009-09-12 ENCOUNTER — Encounter: Payer: Self-pay | Admitting: Internal Medicine

## 2009-09-13 ENCOUNTER — Telehealth: Payer: Self-pay | Admitting: Internal Medicine

## 2009-09-15 ENCOUNTER — Encounter: Payer: Self-pay | Admitting: Internal Medicine

## 2009-09-20 ENCOUNTER — Encounter (INDEPENDENT_AMBULATORY_CARE_PROVIDER_SITE_OTHER): Payer: Self-pay | Admitting: *Deleted

## 2009-09-22 ENCOUNTER — Ambulatory Visit: Payer: Self-pay | Admitting: Internal Medicine

## 2009-09-22 DIAGNOSIS — M899 Disorder of bone, unspecified: Secondary | ICD-10-CM

## 2009-09-22 DIAGNOSIS — M949 Disorder of cartilage, unspecified: Secondary | ICD-10-CM

## 2009-09-22 HISTORY — DX: Disorder of bone, unspecified: M89.9

## 2009-09-27 ENCOUNTER — Telehealth: Payer: Self-pay | Admitting: Internal Medicine

## 2009-10-06 ENCOUNTER — Telehealth (INDEPENDENT_AMBULATORY_CARE_PROVIDER_SITE_OTHER): Payer: Self-pay | Admitting: *Deleted

## 2009-10-07 ENCOUNTER — Telehealth: Payer: Self-pay | Admitting: Internal Medicine

## 2009-10-10 ENCOUNTER — Encounter (INDEPENDENT_AMBULATORY_CARE_PROVIDER_SITE_OTHER): Payer: Self-pay | Admitting: *Deleted

## 2009-10-12 ENCOUNTER — Encounter (INDEPENDENT_AMBULATORY_CARE_PROVIDER_SITE_OTHER): Payer: Self-pay | Admitting: *Deleted

## 2009-10-12 ENCOUNTER — Ambulatory Visit: Payer: Self-pay | Admitting: Gastroenterology

## 2009-10-13 ENCOUNTER — Encounter: Payer: Self-pay | Admitting: Internal Medicine

## 2009-10-13 ENCOUNTER — Encounter (INDEPENDENT_AMBULATORY_CARE_PROVIDER_SITE_OTHER): Payer: Self-pay | Admitting: *Deleted

## 2009-10-13 ENCOUNTER — Telehealth: Payer: Self-pay | Admitting: Gastroenterology

## 2009-10-27 ENCOUNTER — Encounter: Payer: Self-pay | Admitting: Internal Medicine

## 2009-11-10 ENCOUNTER — Ambulatory Visit (HOSPITAL_COMMUNITY): Admission: RE | Admit: 2009-11-10 | Discharge: 2009-11-10 | Payer: Self-pay | Admitting: Gastroenterology

## 2009-11-10 ENCOUNTER — Ambulatory Visit: Payer: Self-pay | Admitting: Gastroenterology

## 2009-11-14 ENCOUNTER — Encounter: Payer: Self-pay | Admitting: Gastroenterology

## 2009-11-14 ENCOUNTER — Telehealth: Payer: Self-pay | Admitting: Gastroenterology

## 2009-11-16 ENCOUNTER — Ambulatory Visit: Payer: Self-pay | Admitting: Gastroenterology

## 2009-11-16 DIAGNOSIS — Z860100 Personal history of colon polyps, unspecified: Secondary | ICD-10-CM

## 2009-11-16 DIAGNOSIS — R1084 Generalized abdominal pain: Secondary | ICD-10-CM | POA: Insufficient documentation

## 2009-11-16 HISTORY — DX: Personal history of colonic polyps: Z86.010

## 2009-11-16 HISTORY — DX: Personal history of colon polyps, unspecified: Z86.0100

## 2009-11-17 LAB — CONVERTED CEMR LAB
Basophils Absolute: 0 10*3/uL (ref 0.0–0.1)
Calcium: 9.1 mg/dL (ref 8.4–10.5)
Creatinine, Ser: 2.7 mg/dL — ABNORMAL HIGH (ref 0.4–1.2)
Eosinophils Absolute: 0.1 10*3/uL (ref 0.0–0.7)
GFR calc non Af Amer: 22.41 mL/min (ref >60)
HCT: 31.9 % — ABNORMAL LOW (ref 36.0–46.0)
Hemoglobin: 10.9 g/dL — ABNORMAL LOW (ref 12.0–15.0)
Lymphs Abs: 1.5 10*3/uL (ref 0.7–4.0)
MCHC: 34.2 g/dL (ref 30.0–36.0)
Monocytes Absolute: 0.4 10*3/uL (ref 0.1–1.0)
Monocytes Relative: 5.9 % (ref 3.0–12.0)
Neutro Abs: 4.5 10*3/uL (ref 1.4–7.7)
Platelets: 269 10*3/uL (ref 150.0–400.0)
RDW: 15.1 % — ABNORMAL HIGH (ref 11.5–14.6)
Sodium: 141 meq/L (ref 135–145)

## 2009-11-22 ENCOUNTER — Ambulatory Visit: Payer: Self-pay | Admitting: Internal Medicine

## 2009-11-22 LAB — CONVERTED CEMR LAB
Nitrite: POSITIVE
Specific Gravity, Urine: 1.025 (ref 1.000–1.030)
Urine Glucose: NEGATIVE mg/dL
Urobilinogen, UA: 0.2 (ref 0.0–1.0)

## 2009-12-02 ENCOUNTER — Telehealth: Payer: Self-pay | Admitting: Internal Medicine

## 2009-12-07 ENCOUNTER — Encounter: Payer: Self-pay | Admitting: Internal Medicine

## 2009-12-08 ENCOUNTER — Telehealth: Payer: Self-pay | Admitting: Internal Medicine

## 2009-12-12 ENCOUNTER — Ambulatory Visit: Payer: Self-pay | Admitting: Internal Medicine

## 2009-12-12 DIAGNOSIS — M549 Dorsalgia, unspecified: Secondary | ICD-10-CM

## 2009-12-15 ENCOUNTER — Encounter: Payer: Self-pay | Admitting: Internal Medicine

## 2010-01-04 ENCOUNTER — Telehealth: Payer: Self-pay | Admitting: Internal Medicine

## 2010-01-05 ENCOUNTER — Telehealth: Payer: Self-pay | Admitting: Internal Medicine

## 2010-01-07 ENCOUNTER — Emergency Department (HOSPITAL_COMMUNITY): Admission: EM | Admit: 2010-01-07 | Discharge: 2010-01-07 | Payer: Self-pay | Admitting: Emergency Medicine

## 2010-01-11 ENCOUNTER — Encounter: Payer: Self-pay | Admitting: Internal Medicine

## 2010-01-12 ENCOUNTER — Encounter: Payer: Self-pay | Admitting: Internal Medicine

## 2010-01-23 ENCOUNTER — Encounter: Payer: Self-pay | Admitting: Internal Medicine

## 2010-01-24 ENCOUNTER — Ambulatory Visit: Payer: Self-pay | Admitting: Internal Medicine

## 2010-01-24 DIAGNOSIS — R413 Other amnesia: Secondary | ICD-10-CM

## 2010-01-24 HISTORY — DX: Other amnesia: R41.3

## 2010-01-25 LAB — CONVERTED CEMR LAB
CO2: 28 meq/L (ref 19–32)
Chloride: 105 meq/L (ref 96–112)
Cholesterol: 176 mg/dL (ref 0–200)
Creatinine, Ser: 3.5 mg/dL — ABNORMAL HIGH (ref 0.4–1.2)
Glucose, Bld: 77 mg/dL (ref 70–99)
Sed Rate: 43 mm/hr — ABNORMAL HIGH (ref 0–22)
Total CHOL/HDL Ratio: 3
VLDL: 41.6 mg/dL — ABNORMAL HIGH (ref 0.0–40.0)
Vitamin B-12: 1027 pg/mL — ABNORMAL HIGH (ref 211–911)

## 2010-01-31 ENCOUNTER — Ambulatory Visit (HOSPITAL_COMMUNITY): Admission: RE | Admit: 2010-01-31 | Discharge: 2010-01-31 | Payer: Self-pay | Admitting: Internal Medicine

## 2010-02-01 ENCOUNTER — Telehealth: Payer: Self-pay | Admitting: Internal Medicine

## 2010-03-15 ENCOUNTER — Telehealth: Payer: Self-pay | Admitting: Internal Medicine

## 2010-03-23 ENCOUNTER — Ambulatory Visit: Payer: Self-pay | Admitting: Internal Medicine

## 2010-03-23 DIAGNOSIS — F411 Generalized anxiety disorder: Secondary | ICD-10-CM

## 2010-03-23 HISTORY — DX: Generalized anxiety disorder: F41.1

## 2010-03-31 ENCOUNTER — Encounter: Payer: Self-pay | Admitting: Internal Medicine

## 2010-04-11 ENCOUNTER — Ambulatory Visit (HOSPITAL_COMMUNITY): Payer: Self-pay | Admitting: Psychiatry

## 2010-04-19 ENCOUNTER — Ambulatory Visit (HOSPITAL_COMMUNITY): Payer: Self-pay | Admitting: Psychiatry

## 2010-04-20 ENCOUNTER — Telehealth: Payer: Self-pay | Admitting: Internal Medicine

## 2010-04-21 ENCOUNTER — Ambulatory Visit (HOSPITAL_COMMUNITY): Payer: Self-pay | Admitting: Psychology

## 2010-05-01 ENCOUNTER — Ambulatory Visit: Payer: Self-pay | Admitting: Internal Medicine

## 2010-05-09 ENCOUNTER — Encounter: Payer: Self-pay | Admitting: Internal Medicine

## 2010-05-11 ENCOUNTER — Telehealth: Payer: Self-pay | Admitting: Internal Medicine

## 2010-05-26 ENCOUNTER — Ambulatory Visit: Payer: Self-pay | Admitting: Internal Medicine

## 2010-05-26 DIAGNOSIS — G4710 Hypersomnia, unspecified: Secondary | ICD-10-CM | POA: Insufficient documentation

## 2010-07-20 ENCOUNTER — Encounter: Payer: Self-pay | Admitting: Internal Medicine

## 2010-07-20 ENCOUNTER — Ambulatory Visit: Payer: Self-pay | Admitting: Internal Medicine

## 2010-07-21 LAB — CONVERTED CEMR LAB
ALT: 14 units/L (ref 0–35)
BUN: 59 mg/dL — ABNORMAL HIGH (ref 6–23)
Basophils Relative: 0.2 % (ref 0.0–3.0)
Bilirubin, Direct: 0.1 mg/dL (ref 0.0–0.3)
CO2: 27 meq/L (ref 19–32)
Chloride: 102 meq/L (ref 96–112)
Eosinophils Relative: 1.9 % (ref 0.0–5.0)
Glucose, Bld: 91 mg/dL (ref 70–99)
HDL: 45.5 mg/dL (ref >39.00)
Hgb A1c MFr Bld: 6.5 % (ref 4.6–6.5)
Lymphocytes Relative: 26.5 % (ref 12.0–46.0)
MCV: 95.4 fL (ref 78.0–100.0)
Monocytes Absolute: 0.4 10*3/uL (ref 0.1–1.0)
Neutrophils Relative %: 65 % (ref 43.0–77.0)
Potassium: 4.2 meq/L (ref 3.5–5.1)
RBC: 3.55 M/uL — ABNORMAL LOW (ref 3.87–5.11)
Total Bilirubin: 0.3 mg/dL (ref 0.3–1.2)
VLDL: 25.4 mg/dL (ref 0.0–40.0)
Vit D, 25-Hydroxy: 56 ng/mL (ref 30–89)
WBC: 6.6 10*3/uL (ref 4.5–10.5)

## 2010-07-25 ENCOUNTER — Encounter: Payer: Self-pay | Admitting: Internal Medicine

## 2010-07-26 ENCOUNTER — Ambulatory Visit (HOSPITAL_COMMUNITY): Admission: RE | Admit: 2010-07-26 | Payer: Self-pay | Source: Home / Self Care | Admitting: Internal Medicine

## 2010-08-02 ENCOUNTER — Telehealth: Payer: Self-pay | Admitting: Internal Medicine

## 2010-08-03 ENCOUNTER — Ambulatory Visit (HOSPITAL_COMMUNITY)
Admission: RE | Admit: 2010-08-03 | Discharge: 2010-08-03 | Payer: Self-pay | Source: Home / Self Care | Attending: Internal Medicine | Admitting: Internal Medicine

## 2010-08-03 ENCOUNTER — Encounter: Payer: Self-pay | Admitting: Internal Medicine

## 2010-08-08 ENCOUNTER — Encounter: Payer: Self-pay | Admitting: Internal Medicine

## 2010-08-15 ENCOUNTER — Ambulatory Visit
Admission: RE | Admit: 2010-08-15 | Discharge: 2010-08-15 | Payer: Self-pay | Source: Home / Self Care | Attending: Internal Medicine | Admitting: Internal Medicine

## 2010-08-15 ENCOUNTER — Encounter: Payer: Self-pay | Admitting: Internal Medicine

## 2010-08-15 ENCOUNTER — Other Ambulatory Visit: Payer: Self-pay | Admitting: Internal Medicine

## 2010-08-15 DIAGNOSIS — R079 Chest pain, unspecified: Secondary | ICD-10-CM | POA: Insufficient documentation

## 2010-08-15 DIAGNOSIS — R062 Wheezing: Secondary | ICD-10-CM | POA: Insufficient documentation

## 2010-08-15 LAB — TSH: TSH: 16.9 u[IU]/mL — ABNORMAL HIGH (ref 0.35–5.50)

## 2010-08-17 ENCOUNTER — Telehealth: Payer: Self-pay | Admitting: Pulmonary Disease

## 2010-08-22 ENCOUNTER — Ambulatory Visit
Admission: RE | Admit: 2010-08-22 | Discharge: 2010-08-22 | Payer: Self-pay | Source: Home / Self Care | Attending: Pulmonary Disease | Admitting: Pulmonary Disease

## 2010-08-22 ENCOUNTER — Telehealth: Payer: Self-pay | Admitting: Internal Medicine

## 2010-08-22 DIAGNOSIS — R0602 Shortness of breath: Secondary | ICD-10-CM | POA: Insufficient documentation

## 2010-08-22 DIAGNOSIS — F518 Other sleep disorders not due to a substance or known physiological condition: Secondary | ICD-10-CM | POA: Insufficient documentation

## 2010-08-22 DIAGNOSIS — G4700 Insomnia, unspecified: Secondary | ICD-10-CM | POA: Insufficient documentation

## 2010-08-23 ENCOUNTER — Encounter: Payer: Self-pay | Admitting: Internal Medicine

## 2010-08-29 ENCOUNTER — Encounter: Payer: Self-pay | Admitting: Internal Medicine

## 2010-08-30 ENCOUNTER — Ambulatory Visit
Admission: RE | Admit: 2010-08-30 | Discharge: 2010-08-30 | Payer: Self-pay | Source: Home / Self Care | Attending: Internal Medicine | Admitting: Internal Medicine

## 2010-08-31 ENCOUNTER — Telehealth: Payer: Self-pay | Admitting: Internal Medicine

## 2010-08-31 ENCOUNTER — Telehealth (INDEPENDENT_AMBULATORY_CARE_PROVIDER_SITE_OTHER): Payer: Self-pay | Admitting: *Deleted

## 2010-09-05 ENCOUNTER — Emergency Department (HOSPITAL_COMMUNITY)
Admission: EM | Admit: 2010-09-05 | Discharge: 2010-09-05 | Payer: Self-pay | Source: Home / Self Care | Admitting: Emergency Medicine

## 2010-09-05 ENCOUNTER — Encounter: Payer: Self-pay | Admitting: Cardiovascular Disease

## 2010-09-05 LAB — CBC
HCT: 34.2 % — ABNORMAL LOW (ref 36.0–46.0)
Hemoglobin: 11.6 g/dL — ABNORMAL LOW (ref 12.0–15.0)
MCH: 30.9 pg (ref 26.0–34.0)
MCHC: 33.9 g/dL (ref 30.0–36.0)
RDW: 15 % (ref 11.5–15.5)

## 2010-09-05 LAB — COMPREHENSIVE METABOLIC PANEL
ALT: 16 U/L (ref 0–35)
AST: 23 U/L (ref 0–37)
Alkaline Phosphatase: 75 U/L (ref 39–117)
CO2: 22 mEq/L (ref 19–32)
Chloride: 107 mEq/L (ref 96–112)
GFR calc Af Amer: 17 mL/min — ABNORMAL LOW (ref >60)
GFR calc non Af Amer: 14 mL/min — ABNORMAL LOW (ref >60)
Potassium: 4.1 mEq/L (ref 3.5–5.1)
Sodium: 138 mEq/L (ref 135–145)
Total Bilirubin: 0.6 mg/dL (ref 0.3–1.2)

## 2010-09-05 LAB — DIFFERENTIAL
Basophils Relative: 1 % (ref 0–1)
Eosinophils Relative: 2 % (ref 0–5)
Lymphocytes Relative: 37 % (ref 12–46)
Monocytes Absolute: 0.5 10*3/uL (ref 0.1–1.0)
Monocytes Relative: 9 % (ref 3–12)
Neutro Abs: 2.9 10*3/uL (ref 1.7–7.7)

## 2010-09-05 LAB — POCT CARDIAC MARKERS: CKMB, poc: 2.4 ng/mL (ref 1.0–8.0)

## 2010-09-07 ENCOUNTER — Ambulatory Visit: Payer: Self-pay | Admitting: Internal Medicine

## 2010-09-07 NOTE — Letter (Signed)
Summary: Outpatient [facility]   Imported By: Lester [place] 05/12/2010 07:01:51  _____________________________________________________________________  External Attachment:    Type:   Image     Comment:   External Document

## 2010-09-07 NOTE — Assessment & Plan Note (Signed)
Summary: EXCESSIVELY SLEEPY---PER DAHLIA --STC   Vital Signs:  Patient profile:   70 year old female Height:      67.5 inches Weight:      187.38 pounds BMI:     29.02 O2 Sat:      98 % on Room air Temp:     98.5 degrees F oral Pulse rate:   67 / minute BP sitting:   120 / 70  (left arm) Cuff size:   regular  Vitals Entered By: Zella Ball Ewing CMA (AAMA) (May 26, 2010 1:25 PM)  O2 Flow:  Room air CC: Excessively sleepy, fell 2 times this morning/Re   Primary Care Provider:  Oliver Barre MD  CC:  Excessively sleepy and fell 2 times this morning/Re.  History of Present Illness: here with acute- c/o excessive sleepiness and fatigue  during the day for 2 wks;  eats candy to stay awake;  sleeping ok at night - no real changem takes 1/2 seroquel to help;  no other recent med changes;  has been recenlty adjusted on thyroid now up to 137 on the synthroid per Dr Dagoberto Ligas;  has been gaining wt for o/w unclear reasons - she states no real change in diet, but has been less active;  gained overall 15 lbs in 2 mo due to increased appetite per pt - craves sweets;  has been under more stress lately that affects her sweet tooth;  denies fever but has had increased back pain;  has seen the spine specialist and asked why more pain lately and was told she should not have surgury but she is still unclear why not;  lives alone - not sure if she snores at night, does not wake herself except with talking in her sleep;  has AM headaches to wake up more frequent recently;  Denies worsening depressive symtpoms, suicidal ideaiton or panic.  Pt denies CP, worsening sob, doe, wheezing, orthopnea, pnd, worsening LE edema, palps, dizziness or syncope  Pt denies new neuro symptoms such as headache, facial or extremity weakness  No fever, wt loss, night sweats, loss of appetite or other constitutional symptoms . CBG's in the 100's. Denies polydipsia, polyruia- sees endo/dr gegick recently.  Trying to follow lower chol diet but  not always successful.  Asks for brand name med samples as she does usually due to severe financial constraints.  Problems Prior to Update: 1)  Hypersomnia  (ICD-780.54) 2)  Anxiety  (ICD-300.00) 3)  Memory Loss  (ICD-780.93) 4)  Back Pain  (ICD-724.5) 5)  Abdominal Pain, Lower  (ICD-789.09) 6)  Colonic Polyps, Hx of  (ICD-V12.72) 7)  Abdominal Pain -generalized  (ICD-789.07) 8)  Personal Hx Colonic Polyps  (ICD-V12.72) 9)  Preventive Health Care  (ICD-V70.0) 10)  Osteopenia  (ICD-733.90) 11)  Health Screening  (ICD-V70.0) 12)  Inguinal Pain, Right  (ICD-789.09) 13)  Menopausal Disorder  (ICD-627.9) 14)  Ear Pain  (ICD-388.70) 15)  Asthmatic Bronchitis, Acute  (ICD-466.0) 16)  Shoulder Pain, Left, Chronic  (ICD-719.41) 17)  Osteoarthritis, Knees, Bilateral  (ICD-715.96) 18)  Cervical Radiculopathy, Left  (ICD-723.4) 19)  Osteoarthritis, Knee, Right  (ICD-715.96) 20)  Gastroenteritis  (ICD-558.9) 21)  Fatigue  (ICD-780.79) 22)  Cigarette Smoker  (ICD-305.1) 23)  Abnormality of Gait  (ICD-781.2) 24)  Fever Unspecified  (ICD-780.60) 25)  Family History of Cad Female 1st Degree Relative <60  (ICD-V16.49) 26)  Restless Leg Syndrome  (ICD-333.94) 27)  Peptic Ulcer Disease  (ICD-533.90) 28)  Gout  (ICD-274.9) 29)  Anemia-nos  (ICD-285.9)  30)  Common Migraine  (ICD-346.10) 31)  Peripheral Neuropathy  (ICD-356.9) 32)  COPD  (ICD-496) 33)  Peripheral Vascular Disease  (ICD-443.9) 34)  Hypothyroidism  (ICD-244.9) 35)  Seizure Disorder  (ICD-780.39) 36)  Renal Insufficiency  (ICD-588.9) 37)  Hypertension  (ICD-401.9) 38)  Hyperlipidemia  (ICD-272.4) 39)  Gerd  (ICD-530.81) 40)  Diabetes Mellitus, Type II  (ICD-250.00) 41)  Depression  (ICD-311) 42)  Allergic Rhinitis  (ICD-477.9)  Medications Prior to Update: 1)  Glimepiride 1 Mg  Tabs (Glimepiride) .... Take 1 By Mouth Qd 2)  Levothyroxine Sodium 112 Mcg Tabs (Levothyroxine Sodium) .Marland Kitchen.. 1 By Mouth Once Daily 3)  Allopurinol 100  Mg Tabs (Allopurinol) .Marland Kitchen.. 1 By Mouth Once Daily 4)  Furosemide 80 Mg  Tabs (Furosemide) .... 3 By Mouth Once Daily 5)  Proair Hfa 108 (90 Base) Mcg/act Aers (Albuterol Sulfate) .... 2 Puffs Up To Four Times Per Day As Needed 6)  Cymbalta 60 Mg Cpep (Duloxetine Hcl) .Marland Kitchen.. 1 By Mouth Once Daily 7)  Januvia 100 Mg  Tabs (Sitagliptin Phosphate) .... 1/4 By Mouth Once Daily 8)  Flexeril 5 Mg Tabs (Cyclobenzaprine Hcl) .Marland Kitchen.. 1 By Mouth Three Times A Day As Needed 9)  Clonazepam 2 Mg Tabs (Clonazepam) .Marland Kitchen.. 1po Two Times A Day As Needed 10)  Spiriva Handihaler 18 Mcg Caps (Tiotropium Bromide Monohydrate) .Marland Kitchen.. 1 Puff Once Daily 11)  Lipitor 40 Mg Tabs (Atorvastatin Calcium) .Marland Kitchen.. 1po Once Daily 12)  Protonix 40 Mg Tbec (Pantoprazole Sodium) .Marland Kitchen.. 1po Once Daily 13)  Promethazine Hcl 25 Mg Tabs (Promethazine Hcl) .Marland Kitchen.. 1 By Mouth Every 6 Hours As Needed 14)  Lyrica 50 Mg Caps (Pregabalin) .... 2 By Mouth Three Times A Day 15)  Hydrocodone-Acetaminophen 7.5-325 Mg Tabs (Hydrocodone-Acetaminophen) .Marland Kitchen.. 1po Four Times Per Day As Needed Pain 16)  Trazodone Hcl 100 Mg Tabs (Trazodone Hcl) .Marland Kitchen.. 1po At Bedtime As Needed (When Does Not Have Seroquel) 17)  Colcrys 0.6 Mg Tabs (Colchicine) .Marland Kitchen.. 1po Once Daily 18)  Hydroxyzine Hcl 25 Mg Tabs (Hydroxyzine Hcl) .Marland Kitchen.. 1  Op Q 6 Hrs As Needed Nausea or Nerves 19)  Seroquel 100 Mg Tabs (Quetiapine Fumarate) .Marland Kitchen.. 1po At Bedtime in Place of The Trazodone  Current Medications (verified): 1)  Glimepiride 1 Mg  Tabs (Glimepiride) .... Take 1 By Mouth Qd 2)  Levothyroxine Sodium 137 Mcg Tabs (Levothyroxine Sodium) .Marland Kitchen.. 1 By Mouth Once Daily 3)  Allopurinol 100 Mg Tabs (Allopurinol) .Marland Kitchen.. 1 By Mouth Once Daily 4)  Furosemide 80 Mg  Tabs (Furosemide) .... 3 By Mouth Once Daily 5)  Proair Hfa 108 (90 Base) Mcg/act Aers (Albuterol Sulfate) .... 2 Puffs Up To Four Times Per Day As Needed 6)  Cymbalta 60 Mg Cpep (Duloxetine Hcl) .Marland Kitchen.. 1 By Mouth Once Daily 7)  Januvia 100 Mg  Tabs  (Sitagliptin Phosphate) .... 1/4 By Mouth Once Daily 8)  Flexeril 5 Mg Tabs (Cyclobenzaprine Hcl) .Marland Kitchen.. 1 By Mouth Three Times A Day As Needed 9)  Clonazepam 2 Mg Tabs (Clonazepam) .Marland Kitchen.. 1po Two Times A Day As Needed 10)  Spiriva Handihaler 18 Mcg Caps (Tiotropium Bromide Monohydrate) .Marland Kitchen.. 1 Puff Once Daily 11)  Lipitor 40 Mg Tabs (Atorvastatin Calcium) .Marland Kitchen.. 1po Once Daily 12)  Protonix 40 Mg Tbec (Pantoprazole Sodium) .Marland Kitchen.. 1po Once Daily 13)  Promethazine Hcl 25 Mg Tabs (Promethazine Hcl) .Marland Kitchen.. 1 By Mouth Every 6 Hours As Needed 14)  Lyrica 50 Mg Caps (Pregabalin) .... 2 By Mouth Three Times A Day 15)  Hydrocodone-Acetaminophen 7.5-325 Mg  Tabs (Hydrocodone-Acetaminophen) .Marland Kitchen.. 1po Four Times Per Day As Needed Pain 16)  Trazodone Hcl 100 Mg Tabs (Trazodone Hcl) .Marland Kitchen.. 1po At Bedtime As Needed (When Does Not Have Seroquel) 17)  Colcrys 0.6 Mg Tabs (Colchicine) .Marland Kitchen.. 1po Once Daily 18)  Hydroxyzine Hcl 25 Mg Tabs (Hydroxyzine Hcl) .Marland Kitchen.. 1  Op Q 6 Hrs As Needed Nausea or Nerves 19)  Seroquel 100 Mg Tabs (Quetiapine Fumarate) .Marland Kitchen.. 1po At Bedtime in Place of The Trazodone  Allergies (verified): 1)  ! * Actos 2)  Nsaids  Past History:  Past Surgical History: Last updated: 09/22/2009 Left toe amputated (2006) Bunionectomy (1980) Goiter Removal (1997) Stress Cardiolite (06/18/2006) Tranthoracic Echocardiogram (06/18/2006) EKG (05/29/2006) Cholecystectomy Rotator cuff repair - left  - dr Lajoyce Corners  Social History: Last updated: 01/24/2010 Alcohol use-no Single - marriage annulled Current Smoker no children disabled - c-spine and back pain Drug use-no  Risk Factors: Smoking Status: current (09/17/2007)  Past Medical History: Allergic rhinitis Depression Diabetes mellitus, type II - Dr Meriel Flavors GERD Hyperlipidemia Hypertension Renal insufficiency - Dr Powell/trenal Seizure disorder Hypothyroidism Peripheral vascular disease Chronic Back Pain/lumbar disc disease/spinal stenosis - Dr  Waldron Labs Heart Murmur COPD Peripheral Neuropathy with pain migraine Anemia-NOS Gout Peptic ulcer disease RLS Osteopenia COLON POLYPS-ADENOMATOUS Anxiety  Review of Systems       all otherwise negative per pt -    Physical Exam  General:  alert and overweight-appearing.   Head:  normocephalic and atraumatic.   Eyes:  vision grossly intact, pupils equal, and pupils round.   Ears:  R ear normal and L ear normal.   Nose:  no external deformity and no nasal discharge.   Mouth:  no gingival abnormalities and pharynx pink and moist.   Neck:  supple and no masses.   Lungs:  normal respiratory effort, R decreased breath sounds, and L decreased breath sounds.   Heart:  normal rate and regular rhythm.   Abdomen:  soft, non-tender, and normal bowel sounds.   Msk:  no spine tender Extremities:  no edema, no erythema  Skin:  color normal and no rashes.   Psych:  dysphoric affect and slightly anxious.     Impression & Recommendations:  Problem # 1:  HYPERSOMNIA (ICD-780.54)  for pulm referral - high suspicion OSA given the symtpoms and hx of recent wt gain Orders: Pulmonary Referral (Pulmonary)  Problem # 2:  FATIGUE (ICD-780.79) multifactorial but no acute problems today, I think mostly related to above,; had thyroid med recently adjusted and sugars under fairly good control;  no worsening psych symptoms at this time  Problem # 3:  HYPERTENSION (ICD-401.9)  Her updated medication list for this problem includes:    Furosemide 80 Mg Tabs (Furosemide) .Marland KitchenMarland KitchenMarland KitchenMarland Kitchen 3 by mouth once daily  BP today: 120/70 Prior BP: 122/68 (05/01/2010)  Labs Reviewed: K+: 4.2 (01/24/2010) Creat: : 3.5 (01/24/2010)   Chol: 176 (01/24/2010)   HDL: 55.00 (01/24/2010)   LDL: 52 (06/03/2009)   TG: 208.0 (01/24/2010) stable overall by hx and exam, ok to continue meds/tx as is   Problem # 4:  HYPERLIPIDEMIA (ICD-272.4)  Her updated medication list for this problem includes:    Lipitor 40 Mg Tabs  (Atorvastatin calcium) .Marland Kitchen... 1po once daily doubt her fatigue related to statin - ok to cont as is  Labs Reviewed: SGOT: 26 (09/17/2007)   SGPT: 25 (09/17/2007)   HDL:55.00 (01/24/2010), 57.90 (06/03/2009)  LDL:52 (06/03/2009), DEL (16/05/9603)  Chol:176 (01/24/2010), 128 (06/03/2009)  Trig:208.0 (01/24/2010), 93.0 (06/03/2009) stable overall by hx  and exam, ok to continue meds/tx as is , Pt to continue diet efforts, good med tolerance; to check labs - goal LDL less than 70   Complete Medication List: 1)  Glimepiride 1 Mg Tabs (Glimepiride) .... Take 1 by mouth qd 2)  Levothyroxine Sodium 137 Mcg Tabs (Levothyroxine sodium) .Marland Kitchen.. 1 by mouth once daily 3)  Allopurinol 100 Mg Tabs (Allopurinol) .Marland Kitchen.. 1 by mouth once daily 4)  Furosemide 80 Mg Tabs (Furosemide) .... 3 by mouth once daily 5)  Proair Hfa 108 (90 Base) Mcg/act Aers (Albuterol sulfate) .... 2 puffs up to four times per day as needed 6)  Cymbalta 60 Mg Cpep (Duloxetine hcl) .Marland Kitchen.. 1 by mouth once daily 7)  Januvia 100 Mg Tabs (Sitagliptin phosphate) .... 1/4 by mouth once daily 8)  Flexeril 5 Mg Tabs (Cyclobenzaprine hcl) .Marland Kitchen.. 1 by mouth three times a day as needed 9)  Clonazepam 2 Mg Tabs (Clonazepam) .Marland Kitchen.. 1po two times a day as needed 10)  Spiriva Handihaler 18 Mcg Caps (Tiotropium bromide monohydrate) .Marland Kitchen.. 1 puff once daily 11)  Lipitor 40 Mg Tabs (Atorvastatin calcium) .Marland Kitchen.. 1po once daily 12)  Protonix 40 Mg Tbec (Pantoprazole sodium) .Marland Kitchen.. 1po once daily 13)  Promethazine Hcl 25 Mg Tabs (Promethazine hcl) .Marland Kitchen.. 1 by mouth every 6 hours as needed 14)  Lyrica 50 Mg Caps (Pregabalin) .... 2 by mouth three times a day 15)  Hydrocodone-acetaminophen 7.5-325 Mg Tabs (Hydrocodone-acetaminophen) .Marland Kitchen.. 1po four times per day as needed pain 16)  Trazodone Hcl 100 Mg Tabs (Trazodone hcl) .Marland Kitchen.. 1po at bedtime as needed (when does not have seroquel) 17)  Colcrys 0.6 Mg Tabs (Colchicine) .Marland Kitchen.. 1po once daily 18)  Hydroxyzine Hcl 25 Mg Tabs  (Hydroxyzine hcl) .Marland Kitchen.. 1  op q 6 hrs as needed nausea or nerves 19)  Seroquel 100 Mg Tabs (Quetiapine fumarate) .Marland Kitchen.. 1po at bedtime in place of the trazodone  Patient Instructions: 1)  You will be contacted about the referral(s) to: pulmonary 2)  Continue all previous medications as before this visit  3)  Please schedule a follow-up appointment in 6 months. Prescriptions: LIPITOR 40 MG TABS (ATORVASTATIN CALCIUM) 1po once daily  #90 x 3   Entered and Authorized by:   Corwin Levins MD   Signed by:   Corwin Levins MD on 05/26/2010   Method used:   Print then Give to Patient   RxID:   [id number]    Orders Added: 1)  Pulmonary Referral [Pulmonary] 2)  Est. Patient Level IV [08657]

## 2010-09-07 NOTE — Progress Notes (Signed)
Summary: Pt assistance forms  Phone Note Call from Patient Call back at Home Phone [phone]   Caller: Patient Call For: Corwin Levins MD Reason for Call: Talk to Nurse Summary of Call: Patient came into the office requesting to speak with a nurse regarding medication assistance forms and getting prescriptions. Initial call taken by: Irma Newness,  September 08, 2009 1:24 PM  Follow-up for Phone Call        paperwork received from pt and forwarded to [facility] to review. Follow-up by: Margaret Pyle, CMA,  September 08, 2009 2:20 PM  Additional Follow-up for Phone Call Additional follow up Details #1::        Paperwork completed and awaiting MD signature. Additional Follow-up by: Lucious Groves,  September 12, 2009 9:14 AM    Additional Follow-up for Phone Call Additional follow up Details #2::    Tresa Endo, pt is requesting a call with questions about her paperwork. Margaret Pyle, CMA  September 12, 2009 9:51 AM   Additional Follow-up for Phone Call Additional follow up Details #3:: Details for Additional Follow-up Action Taken: please confirm with pt she is actually supposed to take 1/4 pill of the [place], as this is quite unusual Additional Follow-up by: Corwin Levins MD,  September 12, 2009 9:53 AM  New/Updated Medications: LYRICA 50 MG  CAPS (PREGABALIN) TAKE 1 three times a day by mouth QD JANUVIA 100 MG  TABS (SITAGLIPTIN PHOSPHATE) 1/4 by mouth once daily Prescriptions: JANUVIA 100 MG  TABS (SITAGLIPTIN PHOSPHATE) 1/4 by mouth once daily  #90 x 3   Entered and Authorized by:   Corwin Levins MD   Signed by:   Corwin Levins MD on 09/13/2009   Method used:   Print then Give to Patient   RxID:   [id number] LYRICA 50 MG  CAPS (PREGABALIN) TAKE 1 three times a day by mouth QD  #270 x 3   Entered by:   Lucious Groves   Authorized by:   Corwin Levins MD   Signed by:   Lucious Groves on 09/12/2009   Method used:   Printed then faxed to ...       [facility]       [address]       [place], [place]  [address]       Ph: [phone]       Fax: [phone]   RxID:   [id number]    Spoke with patient and she confirmed that she does take 1/4 tab. Lucious Groves  September 13, 2009 3:33 PM   ok for [place] as above done hardcopy to LIM side B - dahlia   Corwin Levins MD  September 13, 2009 5:22 PM    Paperwork is complete and ready for pick up. Left message on machine to call back to office. Lucious Groves  September 19, 2009 1:55 PM   Appended Document: Pt assistance forms Patient notified and will pick it up at appt

## 2010-09-07 NOTE — Progress Notes (Signed)
Summary: Rx refill req  Phone Note Call from Patient   Caller: Patient [phone] Summary of Call: Pt called requesting a 30 day refill of Pherergan to pharmacy in [place]. Pt is there caring for her mother. [phone] [facility] Initial call taken by: Margaret Pyle, CMA,  March 15, 2010 1:54 PM  Follow-up for Phone Call        done hardcopy to LIM side B - dahlia  Follow-up by: Corwin Levins MD,  March 15, 2010 2:14 PM  Additional Follow-up for Phone Call Additional follow up Details #1::        Rx called into [facility]. Pt informed Additional Follow-up by: Margaret Pyle, CMA,  March 15, 2010 2:33 PM    Prescriptions: PROMETHAZINE HCL 25 MG TABS (PROMETHAZINE HCL) 1 by mouth every 6 hours as needed  #40 x 0   Entered and Authorized by:   Corwin Levins MD   Signed by:   Corwin Levins MD on 03/15/2010   Method used:   Print then Give to Patient   RxID:   [id number]

## 2010-09-07 NOTE — Procedures (Signed)
Summary: Colonoscopy  Patient: [name] [name] Note: All result statuses are Final unless otherwise noted.  Tests: (1) Colonoscopy (COL)   COL Colonoscopy           DONE     [facility]     [address] [place], [place]  [address]           COLONOSCOPY PROCEDURE REPORT           PATIENT:  [name] [name], [name] [name]  MR#:  [id number]     BIRTHDATE:  15-Aug-1940, 68 yrs. old  GENDER:  female     ENDOSCOPIST:  Rachael Fee, MD     REF. BY:  Oliver Barre, M.D.     PROCEDURE DATE:  11/10/2009     PROCEDURE:  Colonoscopy with snare polypectomy     ASA CLASS:  Class II     INDICATIONS:  Routine Risk Screening had incomplete colonoscopy     with DR. Virginia Rochester in 2005 (poor prep, tortuous exam).  Follow up BE     showed very tortuous colon but no polyps.     MEDICATIONS:   MAC sedation, administered by CRNA           DESCRIPTION OF PROCEDURE:   After the risks benefits and     alternatives of the procedure were thoroughly explained, informed     consent was obtained.  Digital rectal exam was performed and     revealed no rectal masses.   The  endoscope was introduced through     the anus and advanced to the cecum, which was identified by both     the appendix and ileocecal valve, without limitations.  The     quality of the prep was excellent, using MoviPrep.  The instrument     was then slowly withdrawn as the colon was fully examined.     <<PROCEDUREIMAGES>>     FINDINGS:  Three small, sessile polyp were found in the transverse     colon, all were removed with cold snare, all were sent to     pathology. These ranged in size from 3mm to 4mm (see image4 and     image5).  This was otherwise a normal examination of the colon     (see image6, image3, and image2).   Retroflexed views in the     rectum revealed no abnormalities.    The scope was then withdrawn     from the patient and the procedure completed.           COMPLICATIONS:  None     ENDOSCOPIC IMPRESSION:     1) Three subcentimeter  colon polyps, all removed and sent to     pathology     2) Otherwise normal examination           RECOMMENDATIONS:     1) If the polyp(s) removed today are proven to be adenomatous     (pre-cancerous) polyps, you will need a colonoscopy in 3-5 years.     Otherwise you should continue to follow colorectal cancer     screening guidelines for "routine risk" patients with a     colonoscopy in 10 years.     2) You will receive a letter within 1-2 weeks with the results     of your biopsy as well as final recommendations. Please call my     office if you have not received a letter after 3 weeks.  ______________________________     Rachael Fee, MD           n.     eSIGNED:   Rachael Fee at 11/10/2009 12:43 PM           Alpert, Manson Allan, [phone]  Note: An exclamation mark (!) indicates a result that was not dispersed into the flowsheet. Document Creation Date: 11/10/2009 12:44 PM _______________________________________________________________________  (1) Order result status: Final Collection or observation date-time: 11/10/2009 12:31 Requested date-time:  Receipt date-time:  Reported date-time:  Referring Physician:   Ordering Physician: Rob Bunting [phone]) Specimen Source:  Source: Launa Grill Order Number: [id number] Lab site:   Appended Document: Colonoscopy    Clinical Lists Changes  Observations: Added new observation of COLONNXTDUE: 11/2012 (11/14/2009 13:26)

## 2010-09-07 NOTE — Progress Notes (Signed)
Summary: Rx refill  Phone Note Call from Patient Call back at Home Phone [phone]   Caller: Patient Summary of Call: pt is requestin gRx for Clonazepam to [facility] Initial call taken by: Margaret Pyle, CMA,  September 13, 2009 3:11 PM  Follow-up for Phone Call        I have not been the prescribing MD in the past for this;  I believe this has been provided through a mental health provider such as psychiatry, and she should seek refills for a med of this type from that provider Follow-up by: Corwin Levins MD,  September 13, 2009 5:50 PM  Additional Follow-up for Phone Call Additional follow up Details #1::        pt informed.  Pt  is now requesting Lipitor (see previous note) 40mg  1 by mouth once daily and Protonix 40mg  2 by mouth once daily to be put back on medication list and added to her application for [employer] Patient Assistance, okay to change? Pt says she did not ask for Lipitor to be changed Additional Follow-up by: Margaret Pyle, CMA,  September 14, 2009 9:46 AM    Additional Follow-up for Phone Call Additional follow up Details #2::    ok to change meds except protonix is 1 per day - done hardcopy to LIM side B - dahlia   please ask pt to make he "yearly exam" in 2 months Follow-up by: Corwin Levins MD,  September 14, 2009 12:30 PM  Additional Follow-up for Phone Call Additional follow up Details #3:: Details for Additional Follow-up Action Taken: rx in [facility] box side B. Margaret Pyle, CMA  September 14, 2009 12:50 PM   Victorino Dike from [facility] [phone] called stating that pt called saying that I took her Psychiatric Rx's and lost them therefore she need new prescriptions from Psych. I informed Victorino Dike that I took paperwork from pt relating to her patient assistance program and nothing else. Victorino Dike also stated that pt is trying to get Clonopin from their office and I told her off her attempt to get frm Dr. Jonny Ruiz as well.  Victorino Dike was also made aware that pt received Rx fro Alprazolam on 02/07 with 5 refills.  Additional Follow-up by: Margaret Pyle, CMA,  September 15, 2009 10:16 AM  New/Updated Medications: LIPITOR 40 MG TABS (ATORVASTATIN CALCIUM) 1po once daily PROTONIX 40 MG TBEC (PANTOPRAZOLE SODIUM) 1po once daily Prescriptions: LIPITOR 40 MG TABS (ATORVASTATIN CALCIUM) 1po once daily  #90 x 3   Entered and Authorized by:   Corwin Levins MD   Signed by:   Corwin Levins MD on 09/14/2009   Method used:   Print then Give to Patient   RxID:   [id number] PROTONIX 40 MG TBEC (PANTOPRAZOLE SODIUM) 1po once daily  #90 x 3   Entered and Authorized by:   Corwin Levins MD   Signed by:   Corwin Levins MD on 09/14/2009   Method used:   Print then Give to Patient   RxID:   [id number]  above noted; pt has ongoing confusion about her meds, and I do not think she is attempting to overuse  Corwin Levins MD  September 15, 2009 1:03 PM

## 2010-09-07 NOTE — Progress Notes (Signed)
Summary: LBP?  Phone Note Call from Patient Call back at Home Phone [phone]   Caller: Patient Summary of Call: pt called stating that she is having LBP that is severe. Pt is concerned that this could be a early sign of Kidney Dx. Pt is requesting MD review last lab and advise on kidney function. Initial call taken by: Margaret Pyle, CMA,  Dec 08, 2009 11:03 AM  Follow-up for Phone Call        pt was recently tx for UTI;  would need OV for consideration for further tx (ok for eval today) or consider urgent care or ER Follow-up by: Corwin Levins MD,  Dec 08, 2009 1:19 PM  Additional Follow-up for Phone Call Additional follow up Details #1::        pt advised and decided to sch appt with JWJ. Pt transferred to sch Additional Follow-up by: Margaret Pyle, CMA,  Dec 08, 2009 1:44 PM

## 2010-09-07 NOTE — Progress Notes (Signed)
  Phone Note Call from Patient Call back at Home Phone [phone]   Caller: Patient Summary of Call: pt called requesting refills of Furosemide and Allopurinol Initial call taken by: Margaret Pyle, CMA,  September 27, 2009 1:49 PM    Prescriptions: ALLOPURINOL 100 MG TABS (ALLOPURINOL) 1 by mouth once daily  #90 x 3   Entered by:   Margaret Pyle, CMA   Authorized by:   Corwin Levins MD   Signed by:   Margaret Pyle, CMA on 09/27/2009   Method used:   Electronically to        [facility] (retail)       [address]       [place], [place]  [address]       Ph: [phone]       Fax: [phone]   RxID:   [id number] LEVOTHYROXINE SODIUM 112 MCG TABS (LEVOTHYROXINE SODIUM) 1 by mouth once daily  #90 x 3   Entered by:   Margaret Pyle, CMA   Authorized by:   Corwin Levins MD   Signed by:   Margaret Pyle, CMA on 09/27/2009   Method used:   Electronically to        [facility] (retail)       [address]       [place], [place]  [address]       Ph: [phone]       Fax: [phone]   RxID:   [id number]

## 2010-09-07 NOTE — Letter (Signed)
Summary: [facility]   Imported By: Lester [place] 04/20/2010 10:18:49  _____________________________________________________________________  External Attachment:    Type:   Image     Comment:   External Document

## 2010-09-07 NOTE — Progress Notes (Signed)
Summary: Lipitor change  Phone Note Other Incoming   Summary of Call: We have completed patient assistance paperwork for the patient and she is on Lipitor. Can the patient change to Simvastatin, which is much cheaper for her? Initial call taken by: Lucious Groves,  September 12, 2009 9:14 AM  Follow-up for Phone Call        ok to change to simvastatin 40 mg - done escript Follow-up by: Corwin Levins MD,  September 12, 2009 10:08 AM  Additional Follow-up for Phone Call Additional follow up Details #1::        noted. Thanks. Additional Follow-up by: Lucious Groves,  September 12, 2009 12:03 PM    New/Updated Medications: SIMVASTATIN 40 MG TABS (SIMVASTATIN) 1 by mouth once daily Prescriptions: SIMVASTATIN 40 MG TABS (SIMVASTATIN) 1 by mouth once daily  #90 x 3   Entered and Authorized by:   Corwin Levins MD   Signed by:   Corwin Levins MD on 09/12/2009   Method used:   Electronically to        [facility] (retail)       [address]       [place], [place]  [address]       Ph: [phone]       Fax: [phone]   RxID:   [id number]

## 2010-09-07 NOTE — Progress Notes (Signed)
Summary: Gout Med  Phone Note Call from Patient   Summary of Call: pt left message on triage-pt states she called [facility] and that they had a replacement for colchicine (pt's discontinued).Pt would like to be prescribed replacement for colchicine for gout flareups since colchicine has worked for her in the past--please advise Initial call taken by: Brenton Grills MA,  February 01, 2010 3:49 PM  Follow-up for Phone Call        the replacement colchrys is very expensive - does she really want this? Follow-up by: Corwin Levins MD,  February 01, 2010 4:59 PM  Additional Follow-up for Phone Call Additional follow up Details #1::        left mess to call office back...............Marland KitchenLamar Sprinkles, CMA  February 01, 2010 5:21 PM   left message on machine for pt to return my call. Margaret Pyle, CMA  February 02, 2010 8:25 AM     Additional Follow-up for Phone Call Additional follow up Details #2::    Pt called back stating that we can send RX into [facility] and she will purchase as many pills as she can afford. Follow-up by: Margaret Pyle, CMA,  February 02, 2010 8:58 AM  Additional Follow-up for Phone Call Additional follow up Details #3:: Details for Additional Follow-up Action Taken: rx done escript Additional Follow-up by: Corwin Levins MD,  February 02, 2010 10:10 AM  New/Updated Medications: COLCRYS 0.6 MG TABS (COLCHICINE) 1po once daily Prescriptions: COLCRYS 0.6 MG TABS (COLCHICINE) 1po once daily  #30 x 11   Entered and Authorized by:   Corwin Levins MD   Signed by:   Corwin Levins MD on 02/02/2010   Method used:   Electronically to        [facility] (retail)       [address]       [place], [place]  [address]       Ph: [phone]       Fax: [phone]   RxID:   [id number]

## 2010-09-07 NOTE — Medication Information (Signed)
Summary: Patient Assistance/[employer] Connection to Care  Patient Assistance/[employer] Connection to [employer]   Imported By: Lester Bracey 09/26/2009 08:59:13  _____________________________________________________________________  External Attachment:    Type:   Image     Comment:   External Document

## 2010-09-07 NOTE — Letter (Signed)
Summary: [facility]  [facility]   Imported By: Sherian Rein 12/23/2009 15:00:07  _____________________________________________________________________  External Attachment:    Type:   Image     Comment:   External Document

## 2010-09-07 NOTE — Progress Notes (Signed)
Summary: ALT med  Phone Note Call from Patient Call back at Home Phone [phone]   Caller: Patient Summary of Call: pt called requesting alternate medication for Welbutrin and to switch Seroquel to Trazodone. Both requested changes are due to cost. Initial call taken by: Margaret Pyle, CMA,  January 04, 2010 3:17 PM  Follow-up for Phone Call        I think, if I am not mistaken, that she sees psychiatry.  Psychiatric medication questions should be referred to her psychiatrist. Follow-up by: Corwin Levins MD,  January 04, 2010 4:15 PM  Additional Follow-up for Phone Call Additional follow up Details #1::        I will inform pt to contact Psychiatry about Seroquel but JWJ Rxd Webutrin at pt last OV 12/12/2009. Additional Follow-up by: Margaret Pyle, CMA,  January 04, 2010 4:27 PM    Additional Follow-up for Phone Call Additional follow up Details #2::    Since this is the case, I think the best course would be for pt  to make OV with Psychiatry and allow them to determine best medication combination , as we should not have 2 doctors changing meds Follow-up by: Corwin Levins MD,  January 04, 2010 4:29 PM  Additional Follow-up for Phone Call Additional follow up Details #3:: Details for Additional Follow-up Action Taken: pt states that she is not being followed by Psychiatry although she has tried find a new Dr. Per pt "no one takes Medicare". Pt would like help with finding a new Dr but she is requesting refills while she waits for referral. please advise? Margaret Pyle, CMA  January 04, 2010 4:38 PM    I will refer to psychiatry - hopefully to psych that takes medicare Corwin Levins MD  January 04, 2010 4:51 PM

## 2010-09-07 NOTE — Progress Notes (Signed)
Summary: Pt request  Phone Note Call from Patient Call back at Home Phone [phone]   Caller: Patient [phone] (c) Summary of Call: pt called stating that her Rx plan was cancelled by Insurance. pt is requesting [facility] faxed list of Medications to [facility] for her to be considered for membership. List faxed to [phone]. pt informed Initial call taken by: Margaret Pyle, CMA,  August 30, 2009 2:21 PM

## 2010-09-07 NOTE — Progress Notes (Signed)
Summary: UA results  Phone Note Call from Patient Call back at Home Phone [phone]   Caller: Patient Call For: Dr Jonny Ruiz Summary of Call: Pt states she has had labwork recently and has not gotten a call w/results and they are not on phonetree, pt requesting call w/results. Initial call taken by: Verdell Face,  December 02, 2009 11:46 AM  Follow-up for Phone Call        pt informed of UA and Ucx Follow-up by: Margaret Pyle, CMA,  December 02, 2009 11:53 AM

## 2010-09-07 NOTE — Assessment & Plan Note (Signed)
Summary: BREATHING PROBLEMS/ WANTS REFERRAL TO PULMONARY /NWS   Vital Signs:  Patient profile:   70 year old female Height:      67.5 inches Weight:      189.38 pounds BMI:     29.33 O2 Sat:      95 % on Room air Temp:     98.8 degrees F oral Pulse rate:   83 / minute BP sitting:   142 / 68  (left arm) Cuff size:   regular  Vitals Entered By: Zella Ball Ewing CMA (AAMA) (August 15, 2010 1:42 PM)  O2 Flow:  Room air CC: Breathing problems/RE   Primary Care Provider:  Oliver Barre MD  CC:  Breathing problems/RE.  History of Present Illness: here to f/u;  was supposed to have left shoulder surgury today but pt deferred after she spoke to her brother (an MD in [place]) who felt she wa sob and congested on the phone and should see pulmonary;  she called and made appt jan 18 with dr clance, but was then told it was for sleep and could not self refer for other reason; so is here for furhter eval and tx, and possible referral; pt states has had 3 wks onset bronchial congestion despite the mucines, with some anterior chest discomfort, dull and pleuritic and assoc with tighness to breathing deep;  Pt denies CP, orthopnea, pnd, worsening LE edema, palps, dizziness or syncope .  Pt denies new neuro symptoms such as headache, facial or extremity weakness  Pt denies polydipsia, polyuria, or low sugar symptoms such as shakiness improved with eating.  Overall good compliance with meds, trying to follow low chol, DM diet, wt stable, little excercise however  CBG's in the lower 100's.  Rash to the anterior neck from last visit near resolved with prednisone tx, but may be ocming back?    Denies hyper or hypothyroid symtpoms such as voice/skin change , though has gained wt.    Problems Prior to Update: 1)  Chest Pain  (ICD-786.50) 2)  Wheezing  (ICD-786.07) 3)  Bronchitis-acute  (ICD-466.0) 4)  Hepatotoxicity, Drug-induced, Risk of  (ICD-V58.69) 5)  Shoulder Pain, Left  (ICD-719.41) 6)  Hypersomnia   (ICD-780.54) 7)  Anxiety  (ICD-300.00) 8)  Memory Loss  (ICD-780.93) 9)  Back Pain  (ICD-724.5) 10)  Abdominal Pain, Lower  (ICD-789.09) 11)  Colonic Polyps, Hx of  (ICD-V12.72) 12)  Abdominal Pain -generalized  (ICD-789.07) 13)  Personal Hx Colonic Polyps  (ICD-V12.72) 14)  Preventive Health Care  (ICD-V70.0) 15)  Osteopenia  (ICD-733.90) 16)  Health Screening  (ICD-V70.0) 17)  Inguinal Pain, Right  (ICD-789.09) 18)  Menopausal Disorder  (ICD-627.9) 19)  Ear Pain  (ICD-388.70) 20)  Asthmatic Bronchitis, Acute  (ICD-466.0) 21)  Shoulder Pain, Left, Chronic  (ICD-719.41) 22)  Osteoarthritis, Knees, Bilateral  (ICD-715.96) 23)  Cervical Radiculopathy, Left  (ICD-723.4) 24)  Osteoarthritis, Knee, Right  (ICD-715.96) 25)  Gastroenteritis  (ICD-558.9) 26)  Fatigue  (ICD-780.79) 27)  Cigarette Smoker  (ICD-305.1) 28)  Abnormality of Gait  (ICD-781.2) 29)  Fever Unspecified  (ICD-780.60) 30)  Family History of Cad Female 1st Degree Relative <60  (ICD-V16.49) 31)  Restless Leg Syndrome  (ICD-333.94) 32)  Peptic Ulcer Disease  (ICD-533.90) 33)  Gout  (ICD-274.9) 34)  Anemia-nos  (ICD-285.9) 35)  Common Migraine  (ICD-346.10) 36)  Peripheral Neuropathy  (ICD-356.9) 37)  COPD  (ICD-496) 38)  Peripheral Vascular Disease  (ICD-443.9) 39)  Hypothyroidism  (ICD-244.9) 40)  Seizure Disorder  (ICD-780.39) 41)  Renal Insufficiency  (ICD-588.9) 42)  Hypertension  (ICD-401.9) 43)  Hyperlipidemia  (ICD-272.4) 44)  Gerd  (ICD-530.81) 45)  Diabetes Mellitus, Type II  (ICD-250.00) 46)  Depression  (ICD-311) 47)  Allergic Rhinitis  (ICD-477.9)  Medications Prior to Update: 1)  Glimepiride 1 Mg  Tabs (Glimepiride) .... Take 1 By Mouth Qd 2)  Levothyroxine Sodium 112 Mcg Tabs (Levothyroxine Sodium) .Marland Kitchen.. 1 By Mouth Once Daily 3)  Allopurinol 100 Mg Tabs (Allopurinol) .Marland Kitchen.. 1 By Mouth Once Daily 4)  Furosemide 80 Mg  Tabs (Furosemide) .... 3 By Mouth Once Daily 5)  Proair Hfa 108 (90 Base) Mcg/act  Aers (Albuterol Sulfate) .... 2 Puffs Up To Four Times Per Day As Needed 6)  Cymbalta 60 Mg Cpep (Duloxetine Hcl) .Marland Kitchen.. 1 By Mouth Once Daily 7)  Januvia 100 Mg  Tabs (Sitagliptin Phosphate) .... 1/4 By Mouth Once Daily 8)  Flexeril 5 Mg Tabs (Cyclobenzaprine Hcl) .Marland Kitchen.. 1 By Mouth Three Times A Day As Needed 9)  Clonazepam 2 Mg Tabs (Clonazepam) .Marland Kitchen.. 1po Two Times A Day As Needed 10)  Spiriva Handihaler 18 Mcg Caps (Tiotropium Bromide Monohydrate) .Marland Kitchen.. 1 Puff Once Daily 11)  Lipitor 40 Mg Tabs (Atorvastatin Calcium) .Marland Kitchen.. 1po Once Daily 12)  Protonix 40 Mg Tbec (Pantoprazole Sodium) .Marland Kitchen.. 1po Once Daily 13)  Promethazine Hcl 25 Mg Tabs (Promethazine Hcl) .Marland Kitchen.. 1 By Mouth Every 6 Hours As Needed 14)  Lyrica 50 Mg Caps (Pregabalin) .... 2 By Mouth Three Times A Day 15)  Hydrocodone-Acetaminophen 7.5-325 Mg Tabs (Hydrocodone-Acetaminophen) .Marland Kitchen.. 1po Four Times Per Day As Needed Pain 16)  Trazodone Hcl 100 Mg Tabs (Trazodone Hcl) .Marland Kitchen.. 1po At Bedtime As Needed (When Does Not Have Seroquel) 17)  Colcrys 0.6 Mg Tabs (Colchicine) .Marland Kitchen.. 1po Once Daily 18)  Hydroxyzine Hcl 25 Mg Tabs (Hydroxyzine Hcl) .Marland Kitchen.. 1  Op Q 6 Hrs As Needed Nausea or Nerves 19)  Seroquel 100 Mg Tabs (Quetiapine Fumarate) .Marland Kitchen.. 1po At Bedtime in Place of The Trazodone 20)  Prednisone 10 Mg Tabs (Prednisone) .... 3po Qd For 3days, Then 2po Qd For 3days, Then 1po Qd For 3days, Then Stop  Current Medications (verified): 1)  Glimepiride 1 Mg  Tabs (Glimepiride) .... Take 1 By Mouth Qd 2)  Levothyroxine Sodium 112 Mcg Tabs (Levothyroxine Sodium) .Marland Kitchen.. 1 By Mouth Once Daily 3)  Allopurinol 100 Mg Tabs (Allopurinol) .Marland Kitchen.. 1 By Mouth Once Daily 4)  Furosemide 80 Mg  Tabs (Furosemide) .... 3 By Mouth Once Daily 5)  Proair Hfa 108 (90 Base) Mcg/act Aers (Albuterol Sulfate) .... 2 Puffs Up To Four Times Per Day As Needed 6)  Cymbalta 60 Mg Cpep (Duloxetine Hcl) .Marland Kitchen.. 1 By Mouth Once Daily 7)  Januvia 100 Mg  Tabs (Sitagliptin Phosphate) .... 1/4 By  Mouth Once Daily 8)  Flexeril 5 Mg Tabs (Cyclobenzaprine Hcl) .Marland Kitchen.. 1 By Mouth Three Times A Day As Needed 9)  Clonazepam 2 Mg Tabs (Clonazepam) .Marland Kitchen.. 1po Two Times A Day As Needed 10)  Spiriva Handihaler 18 Mcg Caps (Tiotropium Bromide Monohydrate) .Marland Kitchen.. 1 Puff Once Daily 11)  Lipitor 40 Mg Tabs (Atorvastatin Calcium) .Marland Kitchen.. 1po Once Daily 12)  Protonix 40 Mg Tbec (Pantoprazole Sodium) .Marland Kitchen.. 1po Once Daily 13)  Promethazine Hcl 25 Mg Tabs (Promethazine Hcl) .Marland Kitchen.. 1 By Mouth Every 6 Hours As Needed 14)  Lyrica 50 Mg Caps (Pregabalin) .... 2 By Mouth Three Times A Day 15)  Hydrocodone-Acetaminophen 7.5-325 Mg Tabs (Hydrocodone-Acetaminophen) .Marland Kitchen.. 1po Four Times Per Day As Needed Pain 16)  Trazodone Hcl 100 Mg Tabs (Trazodone Hcl) .Marland Kitchen.. 1po At Bedtime As Needed (When Does Not Have Seroquel) 17)  Colcrys 0.6 Mg Tabs (Colchicine) .Marland Kitchen.. 1po Once Daily 18)  Hydroxyzine Hcl 25 Mg Tabs (Hydroxyzine Hcl) .Marland Kitchen.. 1  Op Q 6 Hrs As Needed Nausea or Nerves 19)  Seroquel 100 Mg Tabs (Quetiapine Fumarate) .Marland Kitchen.. 1po At Bedtime in Place of The Trazodone 20)  Azithromycin 250 Mg Tabs (Azithromycin) .... 2po Qd For 1 Day, Then 1po Qd For 4days, Then Stop 21)  Tussionex Pennkinetic Er 10-8 Mg/2ml Lqcr (Hydrocod Polst-Chlorphen Polst) .Marland Kitchen.. 1 Tsp By Mouth Two Times A Day As Needed Cough 22)  Prednisone 10 Mg Tabs (Prednisone) .... 3po Qd For 3days, Then 2po Qd For 3days, Then 1po Qd For 3days, Then Stop  Allergies (verified): 1)  ! * Actos 2)  Nsaids  Past History:  Past Medical History: Last updated: 05/26/2010 Allergic rhinitis Depression Diabetes mellitus, type II - Dr Meriel Flavors GERD Hyperlipidemia Hypertension Renal insufficiency - Dr Powell/trenal Seizure disorder Hypothyroidism Peripheral vascular disease Chronic Back Pain/lumbar disc disease/spinal stenosis - Dr Waldron Labs Heart Murmur COPD Peripheral Neuropathy with pain migraine Anemia-NOS Gout Peptic ulcer disease RLS Osteopenia COLON  POLYPS-ADENOMATOUS Anxiety  Past Surgical History: Last updated: 09/22/2009 Left toe amputated (2006) Bunionectomy (1980) Goiter Removal (1997) Stress Cardiolite (06/18/2006) Tranthoracic Echocardiogram (06/18/2006) EKG (05/29/2006) Cholecystectomy Rotator cuff repair - left  - dr Lajoyce Corners  Social History: Last updated: 01/24/2010 Alcohol use-no Single - marriage annulled Current Smoker no children disabled - c-spine and back pain Drug use-no  Risk Factors: Smoking Status: current (09/17/2007)  Review of Systems       all otherwise negative per pt -    Physical Exam  General:  alert.   Head:  normocephalic and atraumatic.   Eyes:  vision grossly intact, pupils equal, and pupils round.   Ears:  R ear normal and L ear normal.   Nose:  no external deformity and no nasal discharge.   Mouth:  pharyngeal erythema and fair dentition.   Neck:  supple and no masses.   Lungs:  normal respiratory effort, R decreased breath sounds, R wheezes, L decreased breath sounds, and L wheezes.   Heart:  normal rate and regular rhythm.   Msk:  no joint tenderness and no joint swelling.   Extremities:  no edema, no erythema  Psych:  not depressed appearing and moderately anxious.     Impression & Recommendations:  Problem # 1:  BRONCHITIS-ACUTE (ICD-466.0)  Her updated medication list for this problem includes:    Proair Hfa 108 (90 Base) Mcg/act Aers (Albuterol sulfate) .Marland Kitchen... 2 puffs up to four times per day as needed    Spiriva Handihaler 18 Mcg Caps (Tiotropium bromide monohydrate) .Marland Kitchen... 1 puff once daily    Azithromycin 250 Mg Tabs (Azithromycin) .Marland Kitchen... 2po qd for 1 day, then 1po qd for 4days, then stop    Tussionex Pennkinetic Er 10-8 Mg/52ml Lqcr (Hydrocod polst-chlorphen polst) .Marland Kitchen... 1 tsp by mouth two times a day as needed cough treat as above, f/u any worsening signs or symptoms , cant r/o pna completely, will check CXR  Orders: T-2 View CXR, Same Day (71020.5TC)  Problem # 2:   WHEEZING (ICD-786.07) mild, likely related to above  - bronchospasm;  for predpack for home, also gave sample symbicort 80/4.5 - 2 puffs two times a day until sample gone; dont think she needs referral specifially for this at this time  Problem # 3:  HYPOTHYROIDISM (ICD-244.9)  Her  updated medication list for this problem includes:    Levothyroxine Sodium 112 Mcg Tabs (Levothyroxine sodium) .Marland Kitchen... 1 by mouth once daily due for re-check TSH - will order  Labs Reviewed: TSH: 0.09 (07/20/2010)    HgBA1c: 6.5 (07/20/2010) Chol: 140 (07/20/2010)   HDL: 45.50 (07/20/2010)   LDL: 69 (07/20/2010)   TG: 127.0 (07/20/2010)  Orders: TLB-TSH (Thyroid Stimulating Hormone) (84443-TSH)  Problem # 4:  DIABETES MELLITUS, TYPE II (ICD-250.00)  Her updated medication list for this problem includes:    Glimepiride 1 Mg Tabs (Glimepiride) .Marland Kitchen... Take 1 by mouth qd    Januvia 100 Mg Tabs (Sitagliptin phosphate) .Marland Kitchen... 1/4 by mouth once daily  Labs Reviewed: Creat: 3.0 (07/20/2010)    Reviewed HgBA1c results: 6.5 (07/20/2010)  6.4 (01/24/2010) stable overall by hx and exam, ok to continue meds/tx as is - pt to call for onset polys with steroid tx above, or cbg > 200  Complete Medication List: 1)  Glimepiride 1 Mg Tabs (Glimepiride) .... Take 1 by mouth qd 2)  Levothyroxine Sodium 112 Mcg Tabs (Levothyroxine sodium) .Marland Kitchen.. 1 by mouth once daily 3)  Allopurinol 100 Mg Tabs (Allopurinol) .Marland Kitchen.. 1 by mouth once daily 4)  Furosemide 80 Mg Tabs (Furosemide) .... 3 by mouth once daily 5)  Proair Hfa 108 (90 Base) Mcg/act Aers (Albuterol sulfate) .... 2 puffs up to four times per day as needed 6)  Cymbalta 60 Mg Cpep (Duloxetine hcl) .Marland Kitchen.. 1 by mouth once daily 7)  Januvia 100 Mg Tabs (Sitagliptin phosphate) .... 1/4 by mouth once daily 8)  Flexeril 5 Mg Tabs (Cyclobenzaprine hcl) .Marland Kitchen.. 1 by mouth three times a day as needed 9)  Clonazepam 2 Mg Tabs (Clonazepam) .Marland Kitchen.. 1po two times a day as needed 10)  Spiriva  Handihaler 18 Mcg Caps (Tiotropium bromide monohydrate) .Marland Kitchen.. 1 puff once daily 11)  Lipitor 40 Mg Tabs (Atorvastatin calcium) .Marland Kitchen.. 1po once daily 12)  Protonix 40 Mg Tbec (Pantoprazole sodium) .Marland Kitchen.. 1po once daily 13)  Promethazine Hcl 25 Mg Tabs (Promethazine hcl) .Marland Kitchen.. 1 by mouth every 6 hours as needed 14)  Lyrica 50 Mg Caps (Pregabalin) .... 2 by mouth three times a day 15)  Hydrocodone-acetaminophen 7.5-325 Mg Tabs (Hydrocodone-acetaminophen) .Marland Kitchen.. 1po four times per day as needed pain 16)  Trazodone Hcl 100 Mg Tabs (Trazodone hcl) .Marland Kitchen.. 1po at bedtime as needed (when does not have seroquel) 17)  Colcrys 0.6 Mg Tabs (Colchicine) .Marland Kitchen.. 1po once daily 18)  Hydroxyzine Hcl 25 Mg Tabs (Hydroxyzine hcl) .Marland Kitchen.. 1  op q 6 hrs as needed nausea or nerves 19)  Seroquel 100 Mg Tabs (Quetiapine fumarate) .Marland Kitchen.. 1po at bedtime in place of the trazodone 20)  Azithromycin 250 Mg Tabs (Azithromycin) .... 2po qd for 1 day, then 1po qd for 4days, then stop 21)  Tussionex Pennkinetic Er 10-8 Mg/16ml Lqcr (Hydrocod polst-chlorphen polst) .Marland Kitchen.. 1 tsp by mouth two times a day as needed cough 22)  Prednisone 10 Mg Tabs (Prednisone) .... 3po qd for 3days, then 2po qd for 3days, then 1po qd for 3days, then stop  Other Orders: EKG w/ Interpretation (93000)  Patient Instructions: 1)  Your EKG was OK today 2)  Please go to Radiology in the basement level for your X-Ray today  3)  Please go to the Lab in the basement for your blood and/or urine tests today 4)  Please call the number on the [facility] Card for results of your testing  5)  Please take all new medications as prescribed 6)  The Symbicort sample is done at 2 puffs two times a day until gone 7)  Continue all previous medications as before this visit  8)  Please keep your appt with Pulmonary jan 17th as you have planned for sleep as this is still important 9)  I think you can re-schedule the left shoulder surgury for 1 wk or after 10)  Please schedule a follow-up  appointment as needed. Prescriptions: PREDNISONE 10 MG TABS (PREDNISONE) 3po qd for 3days, then 2po qd for 3days, then 1po qd for 3days, then stop  #18 x 0   Entered and Authorized by:   Corwin Levins MD   Signed by:   Corwin Levins MD on 08/15/2010   Method used:   Print then Give to Patient   RxID:   [id number] TUSSIONEX PENNKINETIC ER 10-8 MG/5ML LQCR (HYDROCOD POLST-CHLORPHEN POLST) 1 tsp by mouth two times a day as needed cough  #6oz x 1   Entered and Authorized by:   Corwin Levins MD   Signed by:   Corwin Levins MD on 08/15/2010   Method used:   Print then Give to Patient   RxID:   [id number] AZITHROMYCIN 250 MG TABS (AZITHROMYCIN) 2po qd for 1 day, then 1po qd for 4days, then stop  #6 x 1   Entered and Authorized by:   Corwin Levins MD   Signed by:   Corwin Levins MD on 08/15/2010   Method used:   Print then Give to Patient   RxID:   [id number] LIPITOR 40 MG TABS (ATORVASTATIN CALCIUM) 1po once daily  #90 x 3   Entered and Authorized by:   Corwin Levins MD   Signed by:   Corwin Levins MD on 08/15/2010   Method used:   Print then Give to Patient   RxID:   [id number]    Orders Added: 1)  EKG w/ Interpretation [93000] 2)  T-2 View CXR, Same Day [71020.5TC] 3)  TLB-TSH (Thyroid Stimulating Hormone) [84443-TSH] 4)  Est. Patient Level IV [64332]

## 2010-09-07 NOTE — Miscellaneous (Signed)
Summary: [facility] PREVISIT/PREP  Clinical Lists Changes  Medications: Added new medication of MOVIPREP 100 GM  SOLR (PEG-KCL-NACL-NASULF-NA ASC-C) As per prep instructions. - Signed Rx of MOVIPREP 100 GM  SOLR (PEG-KCL-NACL-NASULF-NA ASC-C) As per prep instructions.;  #1 x 0;  Signed;  Entered by: Harlow Mares CMA (AAMA);  Authorized by: Mardella Layman MD [facility];  Method used: Samples Given Observations: Added new observation of ALLERGY REV: Done (10/12/2009 11:57) the prep was given to the patient.    Prescriptions: MOVIPREP 100 GM  SOLR (PEG-KCL-NACL-NASULF-NA ASC-C) As per prep instructions.  #1 x 0   Entered by:   Harlow Mares CMA (AAMA)   Authorized by:   Mardella Layman MD [facility]   Signed by:   Harlow Mares CMA (AAMA) on 10/12/2009   Method used:   Samples Given   RxID:   [id number]

## 2010-09-07 NOTE — Progress Notes (Signed)
Summary: TENS unit  Phone Note Call from Patient Call back at Home Phone [phone]   Caller: Patient Summary of Call: Pt called requesting Rx for TENS unit. TENS was originally prescribed by pain management. Initial call taken by: Margaret Pyle, CMA,  May 11, 2010 1:07 PM  Follow-up for Phone Call        I dont normally prescribe this Follow-up by: Corwin Levins MD,  May 11, 2010 2:58 PM  Additional Follow-up for Phone Call Additional follow up Details #1::        Pt informed  Additional Follow-up by: Margaret Pyle, CMA,  May 11, 2010 3:14 PM

## 2010-09-07 NOTE — Progress Notes (Signed)
Summary: Med. change  Phone Note From Pharmacy   Caller: [employer] Summary of Call: [name] [name] is stating that Cyclobenzaprine 5mg  is not covered on their formulary. They are suggesting Tizanidine which is on their formulary.  Initial call taken by: [name] Ewing CMA Duncan Dull),  August 31, 2010 11:18 AM  Follow-up for Phone Call        ok to change to tizanidine 4 mg three times a day as needed #90 x 1 ref Follow-up by: Corwin Levins MD,  August 31, 2010 1:04 PM  Additional Follow-up for Phone Call Additional follow up Details #1::        called pt. informed of prescription change Additional Follow-up by: [name] Ewing CMA Duncan Dull),  August 31, 2010 2:47 PM    New/Updated Medications: TIZANIDINE HCL 4 MG TABS (TIZANIDINE HCL) 1 by mouth three times a day Prescriptions: TIZANIDINE HCL 4 MG TABS (TIZANIDINE HCL) 1 by mouth three times a day  #90 x 1   Entered by:   Scharlene Gloss CMA (AAMA)   Authorized by:   Corwin Levins MD   Signed by:   Scharlene Gloss CMA (AAMA) on 08/31/2010   Method used:   Electronically to        [facility] (retail)       [address]       [place], [place]  [address]       Ph: [phone]       Fax: [phone]   RxID:   [id number]

## 2010-09-07 NOTE — Progress Notes (Signed)
Summary: CALL  Phone Note Call from Patient Call back at Home Phone [phone]   Summary of Call: Patient is requesting a call back regarding a place that she was referred to in the past.  Initial call taken by: Lamar Sprinkles, CMA,  September 01, 2009 2:47 PM  Follow-up for Phone Call        Pt called requesting to schedule her colonoscopy as she stated you wanted. She thinks it was three to four years ago at a [facility] on [address], that is all she could remember. She does want one to be scheduled if she is due. Follow-up by: Scharlene Gloss,  September 01, 2009 3:33 PM  Additional Follow-up for Phone Call Additional follow up Details #1::        I cannot tell from this information if she is due for next one or not;  please have pt try to find out the name of the practice and MD involved, so that she either call and ask if she is due for colonoscopy, or come by here to sign a release of information form so that we can request the record;  either way, we really need for her to figure out the practice and MD involved (maybe make a trip there to find out?) Additional Follow-up by: Corwin Levins MD,  September 01, 2009 3:44 PM    Additional Follow-up for Phone Call Additional follow up Details #2::    I called pt and informed to try and get the exact location and MD who did Colonoscopy. She also would need to sign a release form here so that we could receive the information. She will call once she has got the information. Follow-up by: Scharlene Gloss,  September 01, 2009 4:00 PM

## 2010-09-07 NOTE — Letter (Signed)
Summary: [facility] Instructions  [facility]  [address] [place], [place] [address]   Phone: [phone]  Fax: [phone]       [facility]    1940-08-20    MRN: [id number]  ***These are new instructions. Please read carefully and call back if you have questions****      Procedure Day /Date: Thursaday November 10, 2009     Arrival Time: 10:00am     Procedure Time: 11:00am     Location of Procedure:                     [facility] ( Outpatient Registration)                        PREPARATION FOR COLONOSCOPY WITH MOVIPREP   Starting 5 days prior to your procedure 11/05/2009 do not eat nuts, seeds, popcorn, corn, beans, peas,  salads, or any raw vegetables.  Do not take any fiber supplements (e.g. Metamucil, Citrucel, and Benefiber).  THE DAY BEFORE YOUR PROCEDURE         DATE: 11/09/2009  DAY: Wednesday  1.  Drink clear liquids the entire day-NO SOLID FOOD  2.  Do not drink anything colored red or purple.  Avoid juices with pulp.  No orange juice.  3.  Drink at least 64 oz. (8 glasses) of fluid/clear liquids during the day to prevent dehydration and help the prep work efficiently.  CLEAR LIQUIDS INCLUDE: Water Jello Ice Popsicles Tea (sugar ok, no milk/cream) Powdered fruit flavored drinks Coffee (sugar ok, no milk/cream) Gatorade Juice: apple, white grape, white cranberry  Lemonade Clear bullion, consomm, broth Carbonated beverages (any kind) Strained chicken noodle soup Hard Candy                             4.  In the morning, mix first dose of MoviPrep solution:    Empty 1 Pouch A and 1 Pouch B into the disposable container    Add lukewarm drinking water to the top line of the container. Mix to dissolve    Refrigerate (mixed solution should be used within 24 hrs)  5.  Begin drinking the prep at 5:00 p.m. The MoviPrep container is divided by 4 marks.   Every 15 minutes drink the solution down to the next mark (approximately 8 oz) until  the full liter is complete.   6.  Follow completed prep with 16 oz of clear liquid of your choice (Nothing red or purple).  Continue to drink clear liquids until bedtime.  7.  Before going to bed, mix second dose of MoviPrep solution:    Empty 1 Pouch A and 1 Pouch B into the disposable container    Add lukewarm drinking water to the top line of the container. Mix to dissolve    Refrigerate  THE DAY OF YOUR PROCEDURE      DATE: 11/10/2009 DAY: Thursday  Beginning at 6:00am (5 hours before procedure):         1. Every 15 minutes, drink the solution down to the next mark (approx 8 oz) until the full liter is complete.  2. Follow completed prep with 16 oz. of clear liquid of your choice.    3. Nothing to drink after you drink the 16 oz. Take you medications with the 16 oz.   MEDICATION INSTRUCTIONS  Unless otherwise instructed, you should take regular prescription  medications with a small sip of water   as early as possible the morning of your procedure.  Diabetic patients - see separate instructions.          OTHER INSTRUCTIONS  You will need a responsible adult at least 71 years of age to accompany you and drive you home.   This person must remain in the waiting room during your procedure.  Wear loose fitting clothing that is easily removed.  Leave jewelry and other valuables at home.  However, you may wish to bring a book to read or  an iPod/MP3 player to listen to music as you wait for your procedure to start.  Remove all body piercing jewelry and leave at home.  Total time from sign-in until discharge is approximately 2-3 hours.  You should go home directly after your procedure and rest.  You can resume normal activities the  day after your procedure.  The day of your procedure you should not:   Drive   Make legal decisions   Operate machinery   Drink alcohol   Return to work  You will receive specific instructions about eating, activities and  medications before you leave.    The above instructions have been reviewed and explained to me by   Harlow Mares...mailed to patient and explained to patient over the phone.     I fully understand and can verbalize these instructions _____________________________ Date _________

## 2010-09-07 NOTE — Letter (Signed)
Summary: Previsit letter  [facility]  [address] [place], [place] [address]   Phone: [phone]  Fax: [phone]       09/20/2009 MRN: [id number]  [facility] [address] [place], [place]  [address]  Dear Ms. Whitford,  Welcome to the Gastroenterology Division at [facility].    You are scheduled to see a nurse for your pre-procedure visit on 10-12-09 at 1:00p.m. on the 3rd floor at [facility], [address]  We ask that you try to arrive at our office 15 minutes prior to your appointment time to allow for check-in.  Your nurse visit will consist of discussing your medical and surgical history, your immediate family medical history, and your medications.    Please bring a complete list of all your medications or, if you prefer, bring the medication bottles and we will list them.  We will need to be aware of both prescribed and over the counter drugs.  We will need to know exact dosage information as well.  If you are on blood thinners (Coumadin, Plavix, Aggrenox, Ticlid, etc.) please call our office today/prior to your appointment, as we need to consult with your physician about holding your medication.   Please be prepared to read and sign documents such as consent forms, a financial agreement, and acknowledgement forms.  If necessary, and with your consent, a friend or relative is welcome to sit-in on the nurse visit with you.  Please bring your insurance card so that we may make a copy of it.  If your insurance requires a referral to see a specialist, please bring your referral form from your primary care physician.  No co-pay is required for this nurse visit.     If you cannot keep your appointment, please call [phone] to cancel or reschedule prior to your appointment date.  This allows [place] the opportunity to schedule an appointment for another patient in need of care.    Thank you for choosing [facility] for your medical needs.  We  appreciate the opportunity to care for you.  Please visit [place] at our website  to learn more about our practice.                     Sincerely.                                                                                                                   The Gastroenterology Division

## 2010-09-07 NOTE — Letter (Signed)
Summary: CMN for [employer]/[facility]  CMN for [employer]/[facility]   Imported By: Sherian Rein 01/25/2010 10:59:14  _____________________________________________________________________  External Attachment:    Type:   Image     Comment:   External Document

## 2010-09-07 NOTE — Progress Notes (Signed)
Summary: Rash  Phone Note Call from Patient Call back at Home Phone [phone]   Caller: Patient Summary of Call: Pt called stating that after using lotion she was given for Christmas she broke out with rash and itching all over her upper body. Pt has tried Benadryl and Hydrocortisone but she still has itch and redness. Pt is requesting Rx to treat, please advise. Initial call taken by: Margaret Pyle, CMA,  August 02, 2010 3:10 PM  Follow-up for Phone Call        done per emr Follow-up by: Corwin Levins MD,  August 02, 2010 3:17 PM  Additional Follow-up for Phone Call Additional follow up Details #1::        Pt advised Additional Follow-up by: Margaret Pyle, CMA,  August 02, 2010 3:48 PM    New/Updated Medications: PREDNISONE 10 MG TABS (PREDNISONE) 3po qd for 3days, then 2po qd for 3days, then 1po qd for 3days, then stop Prescriptions: PREDNISONE 10 MG TABS (PREDNISONE) 3po qd for 3days, then 2po qd for 3days, then 1po qd for 3days, then stop  #18 x 0   Entered and Authorized by:   Corwin Levins MD   Signed by:   Corwin Levins MD on 08/02/2010   Method used:   Electronically to        [facility] (retail)       [address]       [place], [place]  [address]       Ph: [phone]       Fax: [phone]   RxID:   [id number]

## 2010-09-07 NOTE — Letter (Signed)
Summary: Diabetic Instructions  [facility]  [address] [place], [place] [address]   Phone: [phone]  Fax: [phone]    [facility] 09-16-1940 MRN: [id number]   X    ORAL DIABETIC MEDICATION INSTRUCTIONS  The day before your procedure:   Take your diabetic pill as you do normally  The day of your procedure:   Do not take your diabetic pill    We will check your blood sugar levels during the admission process and again in Recovery before discharging you home   Appended Document: Diabetic Instructions given to the patient

## 2010-09-07 NOTE — Medication Information (Signed)
Summary: Denial/[facility]  Denial/[facility]   Imported By: Lester Pleasanton 11/01/2009 08:46:11  _____________________________________________________________________  External Attachment:    Type:   Image     Comment:   External Document

## 2010-09-07 NOTE — Letter (Signed)
Summary: Diabetic Shoes & Inserts/[facility]  Diabetic [employer]oes [employer] Inserts/[facility]   Imported By: Sherian Rein 01/13/2010 13:21:19  _____________________________________________________________________  External Attachment:    Type:   Image     Comment:   External Document

## 2010-09-07 NOTE — Letter (Signed)
Summary: Results Letter  [facility]  [address] [place], [place] [address]   Phone: [phone]  Fax: [phone]        November 14, 2009 MRN: [id number]    [facility] [place] [address] [place], [place]  [address]    Dear Ms. 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  These are pre-cancerous polyps that may have grown into cancers if they had not been removed.  Based on current nationally recognized surveillance guidelines, I recommend that you have a repeat colonoscopy in 3 years.   We will therefore put your information in our reminder system and will contact you in 3 years to schedule a repeat procedure.  Please call if you have any questions or concerns.       Sincerely,  Rachael Fee MD  This letter has been electronically signed by your physician.  Appended Document: Results Letter letter mailed

## 2010-09-07 NOTE — Progress Notes (Signed)
Summary: Seroquel  Phone Note Call from Patient Call back at Home Phone [phone]   Caller: Patient Summary of Call: pt called requesting Seroquel refill to [facility]. Pt states that "since we made her lose her Psychiatrist" she wants MD to fill. Pt advised that MD is currently out of office but that I will call her once I get a response. Initial call taken by: Margaret Pyle, CMA,  September 27, 2009 4:03 PM  Follow-up for Phone Call        pt is well aware that the reason her former psychiatrist no longer will see her, is that she requested the same controlled substance from me as well as the psychiatrist, which was interpreted as drug seeking behavior by the psychiatrist and pt dismissed.  I decline to accept that "I caused her to lose her psychiatrist."  She has been referred to new psychiatrist.  I will refill short term seroquel rx only - to be done when I return on wed feb23 Follow-up by: Corwin Levins MD,  September 27, 2009 5:33 PM  Additional Follow-up for Phone Call Additional follow up Details #1::        pt informed, rx faxed to pharmacy per pt request. Additional Follow-up by: Margaret Pyle, CMA,  September 28, 2009 8:08 AM

## 2010-09-07 NOTE — Assessment & Plan Note (Signed)
Summary: lower back pain-lb   Vital Signs:  Patient profile:   70 year old female Height:      67.5 inches Weight:      164.50 pounds BMI:     25.48 O2 Sat:      95 % on Room air Temp:     98.4 degrees F oral Pulse rate:   74 / minute BP sitting:   112 / 70  (left arm) Cuff size:   regular  Vitals Entered ByZella Ball Ewing (Dec 12, 2009 11:01 AM)  O2 Flow:  Room air  Preventive Care Screening  Bone Density:    Date:  05/06/2009    Next Due:  05/2011    Results:  abnormal std dev  CC: Low Back Pain/RE   Primary Care Provider:  Oliver Barre MD  CC:  Low Back Pain/RE.  History of Present Illness: lost wt since last oct from 177 to current 164 with more worry about her mother;  Pt denies CP, sob, doe, wheezing, orthopnea, pnd, worsening LE edema, palps, dizziness or syncope  Pt denies new neuro symptoms such as headache, facial or extremity weakness  Pt denies polydipsia, polyuria, or low sugar symptoms such as shakiness improved with eating.  Overall good compliance with meds, trying to follow low chol, DM diet, wt stable, little excercise however  CBG's are different with her different meters with one meter being about 50 pts lower (the newer one) - sees Dr Jenene Slicker for this;  was able to get the cipro after last visit and GU symtpoms resolved;  here today with pain to the right lower back, no radiation, no new change in bowel or bladder function except has had some constipatoin recenlty, no blood;  no fever, night sweats.  No LE pain, nuumbness or weakness, but has had some off balalnce with walking but no falls    Has had increased depressive symptoms and anxiety mostly situational due to finnances and mother with worsening dementia.   She's now worried about herself and dementia  Used to see pain clinic but could no longer afford.    Problems Prior to Update: 1)  Abdominal Pain, Lower  (ICD-789.09) 2)  Colonic Polyps, Hx of  (ICD-V12.72) 3)  Abdominal Pain -generalized   (ICD-789.07) 4)  Personal Hx Colonic Polyps  (ICD-V12.72) 5)  Preventive Health Care  (ICD-V70.0) 6)  Osteopenia  (ICD-733.90) 7)  Health Screening  (ICD-V70.0) 8)  Inguinal Pain, Right  (ICD-789.09) 9)  Menopausal Disorder  (ICD-627.9) 10)  Ear Pain  (ICD-388.70) 11)  Asthmatic Bronchitis, Acute  (ICD-466.0) 12)  Shoulder Pain, Left, Chronic  (ICD-719.41) 13)  Osteoarthritis, Knees, Bilateral  (ICD-715.96) 14)  Cervical Radiculopathy, Left  (ICD-723.4) 15)  Osteoarthritis, Knee, Right  (ICD-715.96) 16)  Gastroenteritis  (ICD-558.9) 17)  Fatigue  (ICD-780.79) 18)  Cigarette Smoker  (ICD-305.1) 19)  Abnormality of Gait  (ICD-781.2) 20)  Fever Unspecified  (ICD-780.60) 21)  Family History of Cad Female 1st Degree Relative <60  (ICD-V16.49) 22)  Restless Leg Syndrome  (ICD-333.94) 23)  Peptic Ulcer Disease  (ICD-533.90) 24)  Gout  (ICD-274.9) 25)  Anemia-nos  (ICD-285.9) 26)  Common Migraine  (ICD-346.10) 27)  Peripheral Neuropathy  (ICD-356.9) 28)  COPD  (ICD-496) 29)  Peripheral Vascular Disease  (ICD-443.9) 30)  Hypothyroidism  (ICD-244.9) 31)  Seizure Disorder  (ICD-780.39) 32)  Renal Insufficiency  (ICD-588.9) 33)  Hypertension  (ICD-401.9) 34)  Hyperlipidemia  (ICD-272.4) 35)  Gerd  (ICD-530.81) 36)  Diabetes Mellitus, Type II  (  ICD-250.00) 37)  Depression  (ICD-311) 38)  Allergic Rhinitis  (ICD-477.9)  Medications Prior to Update: 1)  Glimepiride 1 Mg  Tabs (Glimepiride) .... Take 1 By Mouth Qd 2)  Seroquel 100 Mg  Tabs (Quetiapine Fumarate) .Marland Kitchen.. 1 By Mouth At Bedtime 3)  Levothyroxine Sodium 112 Mcg Tabs (Levothyroxine Sodium) .Marland Kitchen.. 1 By Mouth Once Daily 4)  Allopurinol 100 Mg Tabs (Allopurinol) .Marland Kitchen.. 1 By Mouth Once Daily 5)  Furosemide 80 Mg  Tabs (Furosemide) .... 3 By Mouth Once Daily 6)  Proair Hfa 108 (90 Base) Mcg/act Aers (Albuterol Sulfate) .... 2 Puffs Up To Four Times Per Day As Needed 7)  Cymbalta 60 Mg Cpep (Duloxetine Hcl) .Marland Kitchen.. 1 By Mouth Once Daily 8)   Januvia 100 Mg  Tabs (Sitagliptin Phosphate) .... 1/4 By Mouth Once Daily 9)  Tizanidine Hcl 4 Mg Tabs (Tizanidine Hcl) .Marland Kitchen.. 1 By Mouth Two Times A Day As Needed 10)  Clonazepam 2 Mg Tabs (Clonazepam) .Marland Kitchen.. 1po Two Times A Day 11)  Oxycodone Hcl 5 Mg Tabs (Oxycodone Hcl) .Marland Kitchen.. 1 By Mouth Qid As Needed Per Pain Management 12)  Spiriva Handihaler 18 Mcg Caps (Tiotropium Bromide Monohydrate) .Marland Kitchen.. 1 Puff Once Daily 13)  Lipitor 40 Mg Tabs (Atorvastatin Calcium) .Marland Kitchen.. 1po Once Daily 14)  Protonix 40 Mg Tbec (Pantoprazole Sodium) .Marland Kitchen.. 1po Once Daily 15)  Promethazine Hcl 25 Mg Tabs (Promethazine Hcl) .Marland Kitchen.. 1 By Mouth Every 6 Hours As Needed 16)  Wellbutrin Sr 150 Mg Xr12h-Tab (Bupropion Hcl) .Marland Kitchen.. 1 By Mouth Once Daily 17)  Tramadol Hcl 50 Mg Tabs (Tramadol Hcl) .Marland Kitchen.. 1po Two Times A Day  - Per Pain Md 18)  Oxycontin 10 Mg Xr12h-Tab (Oxycodone Hcl) .Marland Kitchen.. 1 By Mouth Two Times A Day - Per Pain Clinic Md 19)  Lyrica 50 Mg Caps (Pregabalin) .... 2 By Mouth Three Times A Day 20)  Vicodin 5-500 Mg Tabs (Hydrocodone-Acetaminophen) .... Take 1 Tab Every 6  Hours As Needed For Pain 21)  Cipro 500 Mg Tabs (Ciprofloxacin Hcl) .... Take 1 Tab Twice Daily X 7 Days  Current Medications (verified): 1)  Glimepiride 1 Mg  Tabs (Glimepiride) .... Take 1 By Mouth Qd 2)  Seroquel 100 Mg  Tabs (Quetiapine Fumarate) .Marland Kitchen.. 1 By Mouth At Bedtime 3)  Levothyroxine Sodium 112 Mcg Tabs (Levothyroxine Sodium) .Marland Kitchen.. 1 By Mouth Once Daily 4)  Allopurinol 100 Mg Tabs (Allopurinol) .Marland Kitchen.. 1 By Mouth Once Daily 5)  Furosemide 80 Mg  Tabs (Furosemide) .... 3 By Mouth Once Daily 6)  Proair Hfa 108 (90 Base) Mcg/act Aers (Albuterol Sulfate) .... 2 Puffs Up To Four Times Per Day As Needed 7)  Cymbalta 60 Mg Cpep (Duloxetine Hcl) .Marland Kitchen.. 1 By Mouth Once Daily 8)  Januvia 100 Mg  Tabs (Sitagliptin Phosphate) .... 1/4 By Mouth Once Daily 9)  Tizanidine Hcl 4 Mg Tabs (Tizanidine Hcl) .Marland Kitchen.. 1 By Mouth Two Times A Day As Needed 10)  Clonazepam 2 Mg Tabs  (Clonazepam) .Marland Kitchen.. 1po Two Times A Day 11)  Spiriva Handihaler 18 Mcg Caps (Tiotropium Bromide Monohydrate) .Marland Kitchen.. 1 Puff Once Daily 12)  Lipitor 40 Mg Tabs (Atorvastatin Calcium) .Marland Kitchen.. 1po Once Daily 13)  Protonix 40 Mg Tbec (Pantoprazole Sodium) .Marland Kitchen.. 1po Once Daily 14)  Promethazine Hcl 25 Mg Tabs (Promethazine Hcl) .Marland Kitchen.. 1 By Mouth Every 6 Hours As Needed 15)  Wellbutrin Xl 300 Mg Xr24h-Tab (Bupropion Hcl) .Marland Kitchen.. 1po Once Daily 16)  Tramadol Hcl 50 Mg Tabs (Tramadol Hcl) .Marland Kitchen.. 1po Two Times A Day  -  Per Pain Md 17)  Lyrica 50 Mg Caps (Pregabalin) .... 2 By Mouth Three Times A Day 18)  Vicodin 5-500 Mg Tabs (Hydrocodone-Acetaminophen) .... Take 1 Tab Every 6  Hours As Needed For Pain  Allergies (verified): 1)  ! * Actos 2)  Nsaids  Past History:  Past Surgical History: Last updated: 09/22/2009 Left toe amputated (2006) Bunionectomy (1980) Goiter Removal (1997) Stress Cardiolite (06/18/2006) Tranthoracic Echocardiogram (06/18/2006) EKG (05/29/2006) Cholecystectomy Rotator cuff repair - left  - dr Lajoyce Corners  Social History: Last updated: 12/02/2007 Alcohol use-no Single - marriage annulled Current Smoker no children disabled - c-spine and back pain  Risk Factors: Smoking Status: current (09/17/2007)  Past Medical History: Allergic rhinitis Depression Diabetes mellitus, type II - Dr Meriel Flavors GERD Hyperlipidemia Hypertension Renal insufficiency - Dr Powell/trenal Seizure disorder Hypothyroidism Peripheral vascular disease Chronic Back Pain/lumbar disc disease/spinal stenosis Heart Murmur COPD Peripheral Neuropathy with pain migraine Anemia-NOS Gout Peptic ulcer disease RLS Osteopenia COLON POLYPS-ADENOMATOUS Colonic polyps, hx of  Review of Systems       all otherwise negative per pt -    Physical Exam  General:  alert and overweight-appearing - mild Head:  normocephalic and atraumatic.   Eyes:  vision grossly intact, pupils equal, and pupils round.   Ears:   R ear normal and L ear normal.   Nose:  no external deformity and no nasal discharge.   Mouth:  no gingival abnormalities and pharynx pink and moist.   Neck:  supple and no masses.   Lungs:  normal respiratory effort and normal breath sounds.   Heart:  normal rate and regular rhythm.   Abdomen:  soft, non-tender, and normal bowel sounds.   Msk:  spine nontender, mild to mod right paravertebral tender and spasm noted Extremities:  no edema, no erythema  Neurologic:  cranial nerves II-XII intact, strength normal in all extremities, gait normal, and DTRs symmetrical and normal.   Skin:  color normal and no rashes.   Psych:  depressed affect and moderately anxious.     Impression & Recommendations:  Problem # 1:  BACK PAIN (ICD-724.5)  The following medications were removed from the medication list:    Oxycodone Hcl 5 Mg Tabs (Oxycodone hcl) .Marland Kitchen... 1 by mouth qid as needed per pain management    Oxycontin 10 Mg Xr12h-tab (Oxycodone hcl) .Marland Kitchen... 1 by mouth two times a day - per pain clinic md Her updated medication list for this problem includes:    Tizanidine Hcl 4 Mg Tabs (Tizanidine hcl) .Marland Kitchen... 1 by mouth two times a day as needed    Tramadol Hcl 50 Mg Tabs (Tramadol hcl) .Marland Kitchen... 1po two times a day  - per pain md    Vicodin 5-500 Mg Tabs (Hydrocodone-acetaminophen) .Marland Kitchen... Take 1 tab every 6  hours as needed for pain lower right, milder than her more chronic left sided;  treat as above, f/u any worsening signs or symptoms , o/w stable exam   Problem # 2:  DEPRESSION (ICD-311)  Her updated medication list for this problem includes:    Cymbalta 60 Mg Cpep (Duloxetine hcl) .Marland Kitchen... 1 by mouth once daily    Clonazepam 2 Mg Tabs (Clonazepam) .Marland Kitchen... 1po two times a day    Wellbutrin Xl 300 Mg Xr24h-tab (Bupropion hcl) .Marland Kitchen... 1po once daily treat as above, f/u any worsening signs or symptoms  - the wellbutrin is increased  Problem # 3:  ANEMIA-NOS (ICD-285.9)  Hgb: 10.9 (11/16/2009)   Hct: 31.9  (11/16/2009)   Platelets:  269.0 (11/16/2009) RBC: 3.31 (11/16/2009)   RDW: 15.1 (11/16/2009)   WBC: 6.5 (11/16/2009) MCV: 96.4 (11/16/2009)   MCHC: 34.2 (11/16/2009) TSH: 0.08 (09/17/2007) d/w pt - chronic stable overall by hx and exam, ok to continue meds/tx as is , likely related to renal dx  Problem # 4:  COLONIC POLYPS, HX OF (ICD-V12.72) d/w pt - to f/u 3 yrs f/u colonscopy  Problem # 5:  RENAL INSUFFICIENCY (ICD-588.9)  stable overall by hx and exam, ok to continue meds/tx as is , f/u renal as planned  Complete Medication List: 1)  Glimepiride 1 Mg Tabs (Glimepiride) .... Take 1 by mouth qd 2)  Seroquel 100 Mg Tabs (Quetiapine fumarate) .Marland Kitchen.. 1 by mouth at bedtime 3)  Levothyroxine Sodium 112 Mcg Tabs (Levothyroxine sodium) .Marland Kitchen.. 1 by mouth once daily 4)  Allopurinol 100 Mg Tabs (Allopurinol) .Marland Kitchen.. 1 by mouth once daily 5)  Furosemide 80 Mg Tabs (Furosemide) .... 3 by mouth once daily 6)  Proair Hfa 108 (90 Base) Mcg/act Aers (Albuterol sulfate) .... 2 puffs up to four times per day as needed 7)  Cymbalta 60 Mg Cpep (Duloxetine hcl) .Marland Kitchen.. 1 by mouth once daily 8)  Januvia 100 Mg Tabs (Sitagliptin phosphate) .... 1/4 by mouth once daily 9)  Tizanidine Hcl 4 Mg Tabs (Tizanidine hcl) .Marland Kitchen.. 1 by mouth two times a day as needed 10)  Clonazepam 2 Mg Tabs (Clonazepam) .Marland Kitchen.. 1po two times a day 11)  Spiriva Handihaler 18 Mcg Caps (Tiotropium bromide monohydrate) .Marland Kitchen.. 1 puff once daily 12)  Lipitor 40 Mg Tabs (Atorvastatin calcium) .Marland Kitchen.. 1po once daily 13)  Protonix 40 Mg Tbec (Pantoprazole sodium) .Marland Kitchen.. 1po once daily 14)  Promethazine Hcl 25 Mg Tabs (Promethazine hcl) .Marland Kitchen.. 1 by mouth every 6 hours as needed 15)  Wellbutrin Xl 300 Mg Xr24h-tab (Bupropion hcl) .Marland Kitchen.. 1po once daily 16)  Tramadol Hcl 50 Mg Tabs (Tramadol hcl) .Marland Kitchen.. 1po two times a day  - per pain md 17)  Lyrica 50 Mg Caps (Pregabalin) .... 2 by mouth three times a day 18)  Vicodin 5-500 Mg Tabs (Hydrocodone-acetaminophen) .... Take 1  tab every 6  hours as needed for pain  Patient Instructions: 1)  Continue all previous medications as before this visit , except increase the generic wellbutrin to 300 mg per day 2)  Please schedule a follow-up appointment in 6 months or sooner if needed 3)  Please keep your appts with Dr Lowell Guitar and Dr Dagoberto Ligas as you have planned Prescriptions: WELLBUTRIN XL 300 MG XR24H-TAB (BUPROPION HCL) 1po once daily  #90 x 3   Entered and Authorized by:   Corwin Levins MD   Signed by:   Corwin Levins MD on 12/12/2009   Method used:   Print then Give to Patient   RxID:   [id number] VICODIN 5-500 MG TABS (HYDROCODONE-ACETAMINOPHEN) Take 1 tab every 6  hours as needed for pain  #60 x 3   Entered and Authorized by:   Corwin Levins MD   Signed by:   Corwin Levins MD on 12/12/2009   Method used:   Print then Give to Patient   RxID:   [id number]

## 2010-09-07 NOTE — Medication Information (Signed)
Summary: [employer]  [employer]   Imported By: Lester [place] 08/02/2010 08:59:11  _____________________________________________________________________  External Attachment:    Type:   Image     Comment:   External Document

## 2010-09-07 NOTE — Progress Notes (Signed)
Summary: referral req/[facility] pt  Phone Note Call from Patient Call back at [facility] Phone [phone]   Caller: Patient Summary of Call: Pt called stating she has eval today with Dr Shelle Iron regarding sleep study. Pt requested appt for pulmonary eval but was advised that a new referral will have to made for this. Can MD place an order for pulmo eval based on last OV with [facility]? Initial call taken by: Margaret Pyle, CMA,  August 22, 2010 1:36 PM  Follow-up for Phone Call        will defer to dr. Jonny Ruiz for further discussion with pt on same - also would want to review symptoms and sleep eval opininon before deciding on other eval needs -  Follow-up by: Newt Lukes MD,  August 22, 2010 2:16 PM  Additional Follow-up for Phone Call Additional follow up Details #1::        Pt advised and agreed to wait for [facility]. Margaret Pyle, CMA  August 22, 2010 3:22 PM   New Problems: DYSPNEA [phone])   Additional Follow-up for Phone Call Additional follow up Details #2::    as d/w pt at the time of her last visit,  I did not feel she needed referral for the acute symptoms for which she presented last visit;  she does , however need the sleep evaluation as d/w pt at a visit prior to the last on Follow-up by: Corwin Levins MD,  August 22, 2010 3:33 PM  Additional Follow-up for Phone Call Additional follow up Details #3:: Details for Additional Follow-up Action Taken: called patient and she still thinks she needs to be seen by Dr. Shelle Iron for her breathing problems. She did see Dr. Shelle Iron Jan. 17 and she said he agreed to see her for her breathing, but she would need a referral from [facility]. She has not scheduled her shoulder surgury as awaiting seeing a pulmonary MD for her breathing problems. Additional Follow-up by: [name] Ewing CMA Duncan Dull),  August 23, 2010 4:32 PM  New Problems: DYSPNEA (ICD-786.05)   done per emr ,Corwin Levins MD  August 24, 2010 2:20 MP

## 2010-09-07 NOTE — Assessment & Plan Note (Signed)
Summary: FU ON COLONOSCOPY/ ANEMIC PER LETTER/NWS   Vital Signs:  Patient profile:   70 year old female Height:      67.5 inches Weight:      167.25 pounds BMI:     25.90 O2 Sat:      97 % on Room air Temp:     99.5 degrees F oral Pulse rate:   89 / minute BP sitting:   144 / 80  (left arm) Cuff size:   regular  Vitals Entered ByZella Ball Ewing (November 22, 2009 3:12 PM)  O2 Flow:  Room air CC: Followup on Colonoscopy/RE   Primary Care Provider:  Oliver Barre MD  CC:  Followup on Colonoscopy/RE.  History of Present Illness: here post colonoscopy;  c/o midl to mod pain post procedure, mild to mod, constant to mid and right lower quad ;  tx with antibx prescription/cipro and pain meds  but she could not fill them due to finances.  has low grade fever today, denies GU symptoms such as dysuria, freq, urgency , hematuria, flank or back pain, n/v , high fever or chills.  Pt denies polydipsia, polyuria, or low sugar symptoms such as shakiness improved with eating.  Overall fair compliance with meds due to finances, trying to follow low chol, DM diet, wt stable, little excercise however   Problems Prior to Update: 1)  Colonic Polyps, Hx of  (ICD-V12.72) 2)  Abdominal Pain -generalized  (ICD-789.07) 3)  Personal Hx Colonic Polyps  (ICD-V12.72) 4)  Preventive Health Care  (ICD-V70.0) 5)  Osteopenia  (ICD-733.90) 6)  Health Screening  (ICD-V70.0) 7)  Inguinal Pain, Right  (ICD-789.09) 8)  Menopausal Disorder  (ICD-627.9) 9)  Ear Pain  (ICD-388.70) 10)  Asthmatic Bronchitis, Acute  (ICD-466.0) 11)  Shoulder Pain, Left, Chronic  (ICD-719.41) 12)  Osteoarthritis, Knees, Bilateral  (ICD-715.96) 13)  Cervical Radiculopathy, Left  (ICD-723.4) 14)  Osteoarthritis, Knee, Right  (ICD-715.96) 15)  Gastroenteritis  (ICD-558.9) 16)  Fatigue  (ICD-780.79) 17)  Cigarette Smoker  (ICD-305.1) 18)  Abnormality of Gait  (ICD-781.2) 19)  Fever Unspecified  (ICD-780.60) 20)  Family History of Cad Female  1st Degree Relative <60  (ICD-V16.49) 21)  Restless Leg Syndrome  (ICD-333.94) 22)  Peptic Ulcer Disease  (ICD-533.90) 23)  Gout  (ICD-274.9) 24)  Anemia-nos  (ICD-285.9) 25)  Common Migraine  (ICD-346.10) 26)  Peripheral Neuropathy  (ICD-356.9) 27)  COPD  (ICD-496) 28)  Peripheral Vascular Disease  (ICD-443.9) 29)  Hypothyroidism  (ICD-244.9) 30)  Seizure Disorder  (ICD-780.39) 31)  Renal Insufficiency  (ICD-588.9) 32)  Hypertension  (ICD-401.9) 33)  Hyperlipidemia  (ICD-272.4) 34)  Gerd  (ICD-530.81) 35)  Diabetes Mellitus, Type II  (ICD-250.00) 36)  Depression  (ICD-311) 37)  Allergic Rhinitis  (ICD-477.9)  Medications Prior to Update: 1)  Glimepiride 1 Mg  Tabs (Glimepiride) .... Take 1 By Mouth Qd 2)  Seroquel 100 Mg  Tabs (Quetiapine Fumarate) .Marland Kitchen.. 1 By Mouth At Bedtime 3)  Levothyroxine Sodium 112 Mcg Tabs (Levothyroxine Sodium) .Marland Kitchen.. 1 By Mouth Once Daily 4)  Allopurinol 100 Mg Tabs (Allopurinol) .Marland Kitchen.. 1 By Mouth Once Daily 5)  Furosemide 80 Mg  Tabs (Furosemide) .... 3 By Mouth Once Daily 6)  Proair Hfa 108 (90 Base) Mcg/act Aers (Albuterol Sulfate) .... 2 Puffs Up To Four Times Per Day As Needed 7)  Cymbalta 60 Mg Cpep (Duloxetine Hcl) .Marland Kitchen.. 1 By Mouth Once Daily 8)  Januvia 100 Mg  Tabs (Sitagliptin Phosphate) .... 1/4 By Mouth Once Daily 9)  Tizanidine Hcl 4 Mg Tabs (Tizanidine Hcl) .Marland Kitchen.. 1 By Mouth Two Times A Day As Needed 10)  Clonazepam 2 Mg Tabs (Clonazepam) .Marland Kitchen.. 1po Two Times A Day 11)  Oxycodone Hcl 5 Mg Tabs (Oxycodone Hcl) .Marland Kitchen.. 1 By Mouth Qid As Needed Per Pain Management 12)  Spiriva Handihaler 18 Mcg Caps (Tiotropium Bromide Monohydrate) .Marland Kitchen.. 1 Puff Once Daily 13)  Lipitor 40 Mg Tabs (Atorvastatin Calcium) .Marland Kitchen.. 1po Once Daily 14)  Protonix 40 Mg Tbec (Pantoprazole Sodium) .Marland Kitchen.. 1po Once Daily 15)  Promethazine Hcl 25 Mg Tabs (Promethazine Hcl) .Marland Kitchen.. 1 By Mouth Every 6 Hours As Needed 16)  Wellbutrin Sr 150 Mg Xr12h-Tab (Bupropion Hcl) .Marland Kitchen.. 1 By Mouth Once  Daily 17)  Tramadol Hcl 50 Mg Tabs (Tramadol Hcl) .Marland Kitchen.. 1po Two Times A Day  - Per Pain Md 18)  Oxycontin 10 Mg Xr12h-Tab (Oxycodone Hcl) .Marland Kitchen.. 1 By Mouth Two Times A Day - Per Pain Clinic Md 19)  Lyrica 50 Mg Caps (Pregabalin) .... 2 By Mouth Three Times A Day 20)  Vicodin 5-500 Mg Tabs (Hydrocodone-Acetaminophen) .... Take 1 Tab Every 6  Hours As Needed For Pain 21)  Cipro 500 Mg Tabs (Ciprofloxacin Hcl) .... Take 1 Tab Twice Daily X 7 Days  Current Medications (verified): 1)  Glimepiride 1 Mg  Tabs (Glimepiride) .... Take 1 By Mouth Qd 2)  Seroquel 100 Mg  Tabs (Quetiapine Fumarate) .Marland Kitchen.. 1 By Mouth At Bedtime 3)  Levothyroxine Sodium 112 Mcg Tabs (Levothyroxine Sodium) .Marland Kitchen.. 1 By Mouth Once Daily 4)  Allopurinol 100 Mg Tabs (Allopurinol) .Marland Kitchen.. 1 By Mouth Once Daily 5)  Furosemide 80 Mg  Tabs (Furosemide) .... 3 By Mouth Once Daily 6)  Proair Hfa 108 (90 Base) Mcg/act Aers (Albuterol Sulfate) .... 2 Puffs Up To Four Times Per Day As Needed 7)  Cymbalta 60 Mg Cpep (Duloxetine Hcl) .Marland Kitchen.. 1 By Mouth Once Daily 8)  Januvia 100 Mg  Tabs (Sitagliptin Phosphate) .... 1/4 By Mouth Once Daily 9)  Tizanidine Hcl 4 Mg Tabs (Tizanidine Hcl) .Marland Kitchen.. 1 By Mouth Two Times A Day As Needed 10)  Clonazepam 2 Mg Tabs (Clonazepam) .Marland Kitchen.. 1po Two Times A Day 11)  Oxycodone Hcl 5 Mg Tabs (Oxycodone Hcl) .Marland Kitchen.. 1 By Mouth Qid As Needed Per Pain Management 12)  Spiriva Handihaler 18 Mcg Caps (Tiotropium Bromide Monohydrate) .Marland Kitchen.. 1 Puff Once Daily 13)  Lipitor 40 Mg Tabs (Atorvastatin Calcium) .Marland Kitchen.. 1po Once Daily 14)  Protonix 40 Mg Tbec (Pantoprazole Sodium) .Marland Kitchen.. 1po Once Daily 15)  Promethazine Hcl 25 Mg Tabs (Promethazine Hcl) .Marland Kitchen.. 1 By Mouth Every 6 Hours As Needed 16)  Wellbutrin Sr 150 Mg Xr12h-Tab (Bupropion Hcl) .Marland Kitchen.. 1 By Mouth Once Daily 17)  Tramadol Hcl 50 Mg Tabs (Tramadol Hcl) .Marland Kitchen.. 1po Two Times A Day  - Per Pain Md 18)  Oxycontin 10 Mg Xr12h-Tab (Oxycodone Hcl) .Marland Kitchen.. 1 By Mouth Two Times A Day - Per Pain Clinic  Md 19)  Lyrica 50 Mg Caps (Pregabalin) .... 2 By Mouth Three Times A Day 20)  Vicodin 5-500 Mg Tabs (Hydrocodone-Acetaminophen) .... Take 1 Tab Every 6  Hours As Needed For Pain 21)  Cipro 500 Mg Tabs (Ciprofloxacin Hcl) .... Take 1 Tab Twice Daily X 7 Days  Allergies (verified): 1)  ! * Actos 2)  Nsaids  Past History:  Past Surgical History: Last updated: 09/22/2009 Left toe amputated (2006) Bunionectomy (1980) Goiter Removal (5784) Stress Cardiolite (06/18/2006) Tranthoracic Echocardiogram (06/18/2006) EKG (05/29/2006) Cholecystectomy Rotator cuff repair - left  -  dr Lajoyce Corners  Social History: Last updated: 12/02/2007 Alcohol use-no Single - marriage annulled Current Smoker no children disabled - c-spine and back pain  Risk Factors: Smoking Status: current (09/17/2007)  Past Medical History: Allergic rhinitis Depression Diabetes mellitus, type II GERD Hyperlipidemia Hypertension Renal insufficiency Seizure disorder Hypothyroidism Peripheral vascular disease Chronic Back Pain/lumbar disc disease/spinal stenosis Heart Murmur COPD Peripheral Neuropathy with pain migraine Anemia-NOS Gout Peptic ulcer disease RLS Osteopenia COLON POLYPS-ADENOMATOUS Colonic polyps, hx of  Review of Systems       all otherwise negative per pt -    Physical Exam  General:  alert and overweight-appearing.   Head:  normocephalic and atraumatic.   Eyes:  vision grossly intact, pupils equal, and pupils round.   Ears:  R ear normal and L ear normal.   Nose:  no external deformity and no nasal discharge.   Mouth:  no gingival abnormalities and pharynx pink and moist.   Neck:  supple and no masses.   Lungs:  normal respiratory effort and normal breath sounds.   Heart:  normal rate and regular rhythm.   Abdomen:  soft and normal bowel sounds and mild lower abd tender without guarding or rebound.   Extremities:  no edema, no erythema    Impression & Recommendations:  Problem  # 1:  ABDOMINAL PAIN, LOWER (ICD-789.09)  with low grade temp, exam o/w benign,  pain worse lower mid and right - has not been able to afford the cipro for now; will give IM rocephin 1 gm for ? colitis/diverticultiis vs UTI , check urine studies, encouraged pt to proceed with cipro course if able, f/u with worsening symptoms or fever  Orders: T-Culture, Urine ([id number]) Rocephin  250mg  (U9811) Admin of Therapeutic Inj  intramuscular or subcutaneous (91478) TLB-Udip w/ Micro (81001-URINE)  Her updated medication list for this problem includes:    Tizanidine Hcl 4 Mg Tabs (Tizanidine hcl) .Marland Kitchen... 1 by mouth two times a day as needed    Oxycodone Hcl 5 Mg Tabs (Oxycodone hcl) .Marland Kitchen... 1 by mouth qid as needed per pain management    Tramadol Hcl 50 Mg Tabs (Tramadol hcl) .Marland Kitchen... 1po two times a day  - per pain md    Oxycontin 10 Mg Xr12h-tab (Oxycodone hcl) .Marland Kitchen... 1 by mouth two times a day - per pain clinic md    Vicodin 5-500 Mg Tabs (Hydrocodone-acetaminophen) .Marland Kitchen... Take 1 tab every 6  hours as needed for pain  Problem # 2:  HYPERTENSION (ICD-401.9)  Her updated medication list for this problem includes:    Furosemide 80 Mg Tabs (Furosemide) .Marland KitchenMarland KitchenMarland KitchenMarland Kitchen 3 by mouth once daily  BP today: 144/80 Prior BP: 130/60 (11/16/2009)  Labs Reviewed: K+: 4.1 (11/16/2009) Creat: : 2.7 (11/16/2009)   Chol: 128 (06/03/2009)   HDL: 57.90 (06/03/2009)   LDL: 52 (06/03/2009)   TG: 93.0 (06/03/2009) mild elev today, likely situational, ok to follow, continue same treatment   Problem # 3:  DIABETES MELLITUS, TYPE II (ICD-250.00)  Her updated medication list for this problem includes:    Glimepiride 1 Mg Tabs (Glimepiride) .Marland Kitchen... Take 1 by mouth qd    Januvia 100 Mg Tabs (Sitagliptin phosphate) .Marland Kitchen... 1/4 by mouth once daily  Labs Reviewed: Creat: 2.7 (11/16/2009)    Reviewed HgBA1c results: 6.0 (06/03/2009)  6.4 (07/23/2008) stable overall by hx and exam, ok to continue meds/tx as is   Problem # 4:  RENAL  INSUFFICIENCY (ICD-588.9) has f/u appt next mo with renal  Complete Medication List: 1)  Glimepiride 1 Mg Tabs (Glimepiride) .... Take 1 by mouth qd 2)  Seroquel 100 Mg Tabs (Quetiapine fumarate) .Marland Kitchen.. 1 by mouth at bedtime 3)  Levothyroxine Sodium 112 Mcg Tabs (Levothyroxine sodium) .Marland Kitchen.. 1 by mouth once daily 4)  Allopurinol 100 Mg Tabs (Allopurinol) .Marland Kitchen.. 1 by mouth once daily 5)  Furosemide 80 Mg Tabs (Furosemide) .... 3 by mouth once daily 6)  Proair Hfa 108 (90 Base) Mcg/act Aers (Albuterol sulfate) .... 2 puffs up to four times per day as needed 7)  Cymbalta 60 Mg Cpep (Duloxetine hcl) .Marland Kitchen.. 1 by mouth once daily 8)  Januvia 100 Mg Tabs (Sitagliptin phosphate) .... 1/4 by mouth once daily 9)  Tizanidine Hcl 4 Mg Tabs (Tizanidine hcl) .Marland Kitchen.. 1 by mouth two times a day as needed 10)  Clonazepam 2 Mg Tabs (Clonazepam) .Marland Kitchen.. 1po two times a day 11)  Oxycodone Hcl 5 Mg Tabs (Oxycodone hcl) .Marland Kitchen.. 1 by mouth qid as needed per pain management 12)  Spiriva Handihaler 18 Mcg Caps (Tiotropium bromide monohydrate) .Marland Kitchen.. 1 puff once daily 13)  Lipitor 40 Mg Tabs (Atorvastatin calcium) .Marland Kitchen.. 1po once daily 14)  Protonix 40 Mg Tbec (Pantoprazole sodium) .Marland Kitchen.. 1po once daily 15)  Promethazine Hcl 25 Mg Tabs (Promethazine hcl) .Marland Kitchen.. 1 by mouth every 6 hours as needed 16)  Wellbutrin Sr 150 Mg Xr12h-tab (Bupropion hcl) .Marland Kitchen.. 1 by mouth once daily 17)  Tramadol Hcl 50 Mg Tabs (Tramadol hcl) .Marland Kitchen.. 1po two times a day  - per pain md 18)  Oxycontin 10 Mg Xr12h-tab (Oxycodone hcl) .Marland Kitchen.. 1 by mouth two times a day - per pain clinic md 19)  Lyrica 50 Mg Caps (Pregabalin) .... 2 by mouth three times a day 20)  Vicodin 5-500 Mg Tabs (Hydrocodone-acetaminophen) .... Take 1 tab every 6  hours as needed for pain 21)  Cipro 500 Mg Tabs (Ciprofloxacin hcl) .... Take 1 tab twice daily x 7 days  Patient Instructions: 1)  you had the antibiotic shot today 2)  you are given some samples of your usual meds, but we did not have any  antibiotic such as the cipro 3)  Continue all previous medications as before this visit , including the cipro if you can 4)  please keep your appt with Kidney doctor in may 2011 as planned 5)  Please schedule a follow-up appointment in 6 months or sooner if needed   Medication Administration  Injection # 1:    Medication: Rocephin  250mg     Diagnosis: ABDOMINAL PAIN -GENERALIZED (ICD-789.07)    Route: IM    Site: RUOQ gluteus    Exp Date: 11/2011    Lot #: [id number]    Mfr: NovaPlus    Given by: Zella Ball Ewing (November 22, 2009 4:04 PM)  Orders Added: 1)  T-Culture, Urine [[id number]] 2)  Rocephin  250mg  [J0696] 3)  Admin of Therapeutic Inj  intramuscular or subcutaneous [96372] 4)  TLB-Udip w/ Micro [81001-URINE] 5)  Est. Patient Level IV [52841]

## 2010-09-07 NOTE — Miscellaneous (Signed)
Summary: Orders Update  Clinical Lists Changes  Orders: Added new Referral order of Orthopedic Surgeon Referral (Ortho Surgeon) - Signed 

## 2010-09-07 NOTE — Medication Information (Signed)
Summary: [employer]  [employer]   Imported By: Lester [place] 11/01/2009 08:43:30  _____________________________________________________________________  External Attachment:    Type:   Image     Comment:   External Document

## 2010-09-07 NOTE — Progress Notes (Signed)
Summary: abd pain  Phone Note Call from Patient Call back at Home Phone [phone]   Caller: Patient Call For: [name] [name] Reason for Call: Talk to Nurse Summary of Call: Patient states that she is having a lot of abd pain Initial call taken by: Tawni Levy,  November 14, 2009 12:46 PM  Follow-up for Phone Call        Pt. had colonoscopy on Thursday.On Friday she started having abd. pain across all quadrants of lower abd. which she says is a level 8 and Tylenol does not relieve it.Always has some am nausea due to reflux, denies vomiting.No shaking chills. Had soft formed brown stool today. Follow-up by: Teryl Lucy RN,  November 14, 2009 1:50 PM  Additional Follow-up for Phone Call Additional follow up Details #1::        talked with pt.  Stats pain is a nagging pain located in mid abd and hurts more the right.  No increase of pain with pressing on the area.  Some diarrhea today.  States the pain has increased slightly over the weekend.  (PA sch full today).  Additional Follow-up by: Ashok Cordia RN,  November 14, 2009 2:52 PM    Additional Follow-up for Phone Call Additional follow up Details #2::    SHOULD GO TO dR. Valleri Hendricksen... Follow-up by: Mardella Layman MD Clementeen Graham,  November 14, 2009 3:10 PM  Additional Follow-up for Phone Call Additional follow up Details #3:: Details for Additional Follow-up Action Taken: rov with me or PA  tomorrow (needs CBC, bmet about 1-2 hours prior).  if pain worsens overnight, should go to ER. Additional Follow-up by: Rachael Fee MD,  November 14, 2009 3:47 PM   Appended Document: abd pain Offered pt OV tomorrow with Dr. Christella [name].  Pt states she can not come tomorrow.  She uses social services transportation and has to give 24 hour notice before 12:00 the day before she needs a ride.  Pt states she has no one who can bring her.  Appt sch for Wed, 11/16/09 with Amy Esterwood.  Pt instucted to go to the ER if she worsens before she is seen.    Appended Document:  abd pain ok

## 2010-09-07 NOTE — Letter (Signed)
Summary: [facility] Instructions  [facility]  [address] [place], [place] [address]   Phone: [phone]  Fax: [phone]       [facility] [place]    [birth year]    MRN: [id number]        Procedure Day Dorna Bloom:  Jake Shark  10/25/09     Arrival Time:  9:30AM     Procedure Time:  10:30AM     Location of Procedure:                    _X _  [facility] (4th Floor)                        PREPARATION FOR COLONOSCOPY WITH MOVIPREP   Starting 5 days prior to your procedure 10/20/09 do not eat nuts, seeds, popcorn, corn, beans, peas,  salads, or any raw vegetables.  Do not take any fiber supplements (e.g. Metamucil, Citrucel, and Benefiber).  THE DAY BEFORE YOUR PROCEDURE         DATE: 10/24/09  DAY: MONDAY  1.  Drink clear liquids the entire day-NO SOLID FOOD  2.  Do not drink anything colored red or purple.  Avoid juices with pulp.  No orange juice.  3.  Drink at least 64 oz. (8 glasses) of fluid/clear liquids during the day to prevent dehydration and help the prep work efficiently.  CLEAR LIQUIDS INCLUDE: Water Jello Ice Popsicles Tea (sugar ok, no milk/cream) Powdered fruit flavored drinks Coffee (sugar ok, no milk/cream) Gatorade Juice: apple, white grape, white cranberry  Lemonade Clear bullion, consomm, broth Carbonated beverages (any kind) Strained chicken noodle soup Hard Candy                             4.  In the morning, mix first dose of MoviPrep solution:    Empty 1 Pouch A and 1 Pouch B into the disposable container    Add lukewarm drinking water to the top line of the container. Mix to dissolve    Refrigerate (mixed solution should be used within 24 hrs)  5.  Begin drinking the prep at 5:00 p.m. The MoviPrep container is divided by 4 marks.   Every 15 minutes drink the solution down to the next mark (approximately 8 oz) until the full liter is complete.   6.  Follow completed prep with 16 oz of clear liquid of your choice (Nothing  red or purple).  Continue to drink clear liquids until bedtime.  7.  Before going to bed, mix second dose of MoviPrep solution:    Empty 1 Pouch A and 1 Pouch B into the disposable container    Add lukewarm drinking water to the top line of the container. Mix to dissolve    Refrigerate  THE DAY OF YOUR PROCEDURE      DATE: 10/25/09 DAY: TUESDAY  Beginning at 5:30AM_a.m. (5 hours before procedure):         1. Every 15 minutes, drink the solution down to the next mark (approx 8 oz) until the full liter is complete.  2. Follow completed prep with 16 oz. of clear liquid of your choice.    3. You may drink clear liquids until 8:30AM(2 HOURS BEFORE PROCEDURE).   MEDICATION INSTRUCTIONS  Unless otherwise instructed, you should take regular prescription medications with a small sip of water   as early as possible the morning of your  procedure.  Diabetic patients - see separate instructions.  Stop taking Plavix or Aggrenox on  _  _  (7 days before procedure).     Stop taking Coumadin on  _ _  (5 days before procedure).  Additional medication instructions: _         OTHER INSTRUCTIONS  You will need a responsible adult at least 71 years of age to accompany you and drive you home.   This person must remain in the waiting room during your procedure.  Wear loose fitting clothing that is easily removed.  Leave jewelry and other valuables at home.  However, you may wish to bring a book to read or  an iPod/MP3 player to listen to music as you wait for your procedure to start.  Remove all body piercing jewelry and leave at home.  Total time from sign-in until discharge is approximately 2-3 hours.  You should go home directly after your procedure and rest.  You can resume normal activities the  day after your procedure.  The day of your procedure you should not:   Drive   Make legal decisions   Operate machinery   Drink alcohol   Return to work  You will receive  specific instructions about eating, activities and medications before you leave.    The above instructions have been reviewed and explained to me by   _______________________    I fully understand and can verbalize these instructions _____________________________ Date _________

## 2010-09-07 NOTE — Progress Notes (Signed)
Summary: Rx request  Phone Note Call from Patient Call back at Home Phone [phone]   Caller: Patient Summary of Call: pt called stating that she is working on finding a Therapist, sports through [employer]. Pt is requesting Rx for Trazadone 100mg  for sleep until she is able to get appt with psychiatry. please advise Initial call taken by: Margaret Pyle, CMA,  January 05, 2010 2:40 PM  Follow-up for Phone Call        Pt informed  Follow-up by: Lamar Sprinkles, CMA,  January 05, 2010 5:39 PM    New/Updated Medications: TRAZODONE HCL 100 MG TABS (TRAZODONE HCL) 1po at bedtime as needed Prescriptions: TRAZODONE HCL 100 MG TABS (TRAZODONE HCL) 1po at bedtime as needed  #30 x 5   Entered and Authorized by:   Corwin Levins MD   Signed by:   Corwin Levins MD on 01/05/2010   Method used:   Electronically to        [facility] (retail)       [address]       [place], [place]  [address]       Ph: [phone]       Fax: [phone]   RxID:   [id number]  done escript Corwin Levins MD  January 05, 2010 5:31 PM

## 2010-09-07 NOTE — Progress Notes (Signed)
Summary: change to hospital colonoscopy  ---- Converted from flag ---- ---- 10/12/2009 8:42 PM, Rachael Fee MD wrote: I can do her on a non-hospital week, propofol thursday.  Talk with patty about scheduling.  ---- 10/12/2009 1:37 PM, Harlow Mares CMA (AAMA) wrote: Dr. Christella Hartigan this patient sees the pain clinic and is on several pain medications. Dr Jarold Motto would like to know if you can do her colonoscopy at the hospital with MAC. There is a concern that he will have a hard time sedating her upstars. She is already scheduled for 10-25-2009, she was scheduled as a direct.   Thanks Hulan Saas ------------------------------  Phone Note Outgoing Call Call back at [facility] Phone [phone]   Call placed by: Harlow Mares CMA [facility] Dull),  October 13, 2009 10:17 AM Summary of Call: i advised patient that due to her multiple pain meds that we will have to do her procedure at the hospital under genreal sedation. I scheduled her procedure at the hospital for 11/10/2009. i went over the new instructions with the patient and mailed her the instructions.  Initial call taken by: Harlow Mares CMA [facility]),  October 13, 2009 10:19 AM

## 2010-09-07 NOTE — Assessment & Plan Note (Signed)
Vital Signs:  Patient profile:   70 year old female Height:      67.5 inches Weight:      180.25 pounds BMI:     27.91 O2 Sat:      94 % on Room air Temp:     99 degrees F oral Pulse rate:   94 / minute BP sitting:   124 / 60  (left arm) Cuff size:   regular  Vitals Entered By: Zella Ball Ewing CMA Duncan Dull) (July 20, 2010 1:11 PM)  O2 Flow:  Room air  Preventive Care Screening  Last Flu Shot:    Date:  05/06/2010    Results:  given   CC: left Shoulder pain/RE   Primary Care Provider:  Oliver Barre MD  CC:  left Shoulder pain/RE.  History of Present Illness: here with 2 mo pain and decrased ROM to the left shoudler, has hx of surgury to the shoulder 15 yrs ago related to the rotater cuff, has seen Dr Waldron Labs since then for cortisone (none recent); now with midl to mod , constant with some radiation towards the elbow, no recent trauma or injury; wt loss, fever or fall.  Missed an appt with Dr Powell/renal nov 27  (just wasnt in her appt book) and would like labs done here.  Pt denies CP, worsening sob, doe, wheezing, orthopnea, pnd, worsening LE edema, palps, dizziness or syncope  Pt denies new neuro symptoms such as headache, facial or extremity weakness  Pt denies polydipsia, polyuria, or low sugar symptoms such as shakiness improved with eating.  Overall good compliance with meds, trying to follow low chol, DM diet, wt stable, little excercise however No fever, wt loss, night sweats, loss of appetite or other constitutional symptoms  Denies hyper or hypothyroid symtpoms such as wt, voice or skin change.   Problems Prior to Update: 1)  Hepatotoxicity, Drug-induced, Risk of  (ICD-V58.69) 2)  Shoulder Pain, Left  (ICD-719.41) 3)  Hypersomnia  (ICD-780.54) 4)  Anxiety  (ICD-300.00) 5)  Memory Loss  (ICD-780.93) 6)  Back Pain  (ICD-724.5) 7)  Abdominal Pain, Lower  (ICD-789.09) 8)  Colonic Polyps, Hx of  (ICD-V12.72) 9)  Abdominal Pain -generalized  (ICD-789.07) 10)  Personal  Hx Colonic Polyps  (ICD-V12.72) 11)  Preventive Health Care  (ICD-V70.0) 12)  Osteopenia  (ICD-733.90) 13)  Health Screening  (ICD-V70.0) 14)  Inguinal Pain, Right  (ICD-789.09) 15)  Menopausal Disorder  (ICD-627.9) 16)  Ear Pain  (ICD-388.70) 17)  Asthmatic Bronchitis, Acute  (ICD-466.0) 18)  Shoulder Pain, Left, Chronic  (ICD-719.41) 19)  Osteoarthritis, Knees, Bilateral  (ICD-715.96) 20)  Cervical Radiculopathy, Left  (ICD-723.4) 21)  Osteoarthritis, Knee, Right  (ICD-715.96) 22)  Gastroenteritis  (ICD-558.9) 23)  Fatigue  (ICD-780.79) 24)  Cigarette Smoker  (ICD-305.1) 25)  Abnormality of Gait  (ICD-781.2) 26)  Fever Unspecified  (ICD-780.60) 27)  Family History of Cad Female 1st Degree Relative <60  (ICD-V16.49) 28)  Restless Leg Syndrome  (ICD-333.94) 29)  Peptic Ulcer Disease  (ICD-533.90) 30)  Gout  (ICD-274.9) 31)  Anemia-nos  (ICD-285.9) 32)  Common Migraine  (ICD-346.10) 33)  Peripheral Neuropathy  (ICD-356.9) 34)  COPD  (ICD-496) 35)  Peripheral Vascular Disease  (ICD-443.9) 36)  Hypothyroidism  (ICD-244.9) 37)  Seizure Disorder  (ICD-780.39) 38)  Renal Insufficiency  (ICD-588.9) 39)  Hypertension  (ICD-401.9) 40)  Hyperlipidemia  (ICD-272.4) 41)  Gerd  (ICD-530.81) 42)  Diabetes Mellitus, Type II  (ICD-250.00) 43)  Depression  (ICD-311) 44)  Allergic Rhinitis  (  ICD-477.9)  Medications Prior to Update: 1)  Glimepiride 1 Mg  Tabs (Glimepiride) .... Take 1 By Mouth Qd 2)  Levothyroxine Sodium 137 Mcg Tabs (Levothyroxine Sodium) .Marland Kitchen.. 1 By Mouth Once Daily 3)  Allopurinol 100 Mg Tabs (Allopurinol) .Marland Kitchen.. 1 By Mouth Once Daily 4)  Furosemide 80 Mg  Tabs (Furosemide) .... 3 By Mouth Once Daily 5)  Proair Hfa 108 (90 Base) Mcg/act Aers (Albuterol Sulfate) .... 2 Puffs Up To Four Times Per Day As Needed 6)  Cymbalta 60 Mg Cpep (Duloxetine Hcl) .Marland Kitchen.. 1 By Mouth Once Daily 7)  Januvia 100 Mg  Tabs (Sitagliptin Phosphate) .... 1/4 By Mouth Once Daily 8)  Flexeril 5 Mg Tabs  (Cyclobenzaprine Hcl) .Marland Kitchen.. 1 By Mouth Three Times A Day As Needed 9)  Clonazepam 2 Mg Tabs (Clonazepam) .Marland Kitchen.. 1po Two Times A Day As Needed 10)  Spiriva Handihaler 18 Mcg Caps (Tiotropium Bromide Monohydrate) .Marland Kitchen.. 1 Puff Once Daily 11)  Lipitor 40 Mg Tabs (Atorvastatin Calcium) .Marland Kitchen.. 1po Once Daily 12)  Protonix 40 Mg Tbec (Pantoprazole Sodium) .Marland Kitchen.. 1po Once Daily 13)  Promethazine Hcl 25 Mg Tabs (Promethazine Hcl) .Marland Kitchen.. 1 By Mouth Every 6 Hours As Needed 14)  Lyrica 50 Mg Caps (Pregabalin) .... 2 By Mouth Three Times A Day 15)  Hydrocodone-Acetaminophen 7.5-325 Mg Tabs (Hydrocodone-Acetaminophen) .Marland Kitchen.. 1po Four Times Per Day As Needed Pain 16)  Trazodone Hcl 100 Mg Tabs (Trazodone Hcl) .Marland Kitchen.. 1po At Bedtime As Needed (When Does Not Have Seroquel) 17)  Colcrys 0.6 Mg Tabs (Colchicine) .Marland Kitchen.. 1po Once Daily 18)  Hydroxyzine Hcl 25 Mg Tabs (Hydroxyzine Hcl) .Marland Kitchen.. 1  Op Q 6 Hrs As Needed Nausea or Nerves 19)  Seroquel 100 Mg Tabs (Quetiapine Fumarate) .Marland Kitchen.. 1po At Bedtime in Place of The Trazodone  Current Medications (verified): 1)  Glimepiride 1 Mg  Tabs (Glimepiride) .... Take 1 By Mouth Qd 2)  Levothyroxine Sodium 137 Mcg Tabs (Levothyroxine Sodium) .Marland Kitchen.. 1 By Mouth Once Daily 3)  Allopurinol 100 Mg Tabs (Allopurinol) .Marland Kitchen.. 1 By Mouth Once Daily 4)  Furosemide 80 Mg  Tabs (Furosemide) .... 3 By Mouth Once Daily 5)  Proair Hfa 108 (90 Base) Mcg/act Aers (Albuterol Sulfate) .... 2 Puffs Up To Four Times Per Day As Needed 6)  Cymbalta 60 Mg Cpep (Duloxetine Hcl) .Marland Kitchen.. 1 By Mouth Once Daily 7)  Januvia 100 Mg  Tabs (Sitagliptin Phosphate) .... 1/4 By Mouth Once Daily 8)  Flexeril 5 Mg Tabs (Cyclobenzaprine Hcl) .Marland Kitchen.. 1 By Mouth Three Times A Day As Needed 9)  Clonazepam 2 Mg Tabs (Clonazepam) .Marland Kitchen.. 1po Two Times A Day As Needed 10)  Spiriva Handihaler 18 Mcg Caps (Tiotropium Bromide Monohydrate) .Marland Kitchen.. 1 Puff Once Daily 11)  Lipitor 40 Mg Tabs (Atorvastatin Calcium) .Marland Kitchen.. 1po Once Daily 12)  Protonix 40 Mg Tbec  (Pantoprazole Sodium) .Marland Kitchen.. 1po Once Daily 13)  Promethazine Hcl 25 Mg Tabs (Promethazine Hcl) .Marland Kitchen.. 1 By Mouth Every 6 Hours As Needed 14)  Lyrica 50 Mg Caps (Pregabalin) .... 2 By Mouth Three Times A Day 15)  Hydrocodone-Acetaminophen 7.5-325 Mg Tabs (Hydrocodone-Acetaminophen) .Marland Kitchen.. 1po Four Times Per Day As Needed Pain 16)  Trazodone Hcl 100 Mg Tabs (Trazodone Hcl) .Marland Kitchen.. 1po At Bedtime As Needed (When Does Not Have Seroquel) 17)  Colcrys 0.6 Mg Tabs (Colchicine) .Marland Kitchen.. 1po Once Daily 18)  Hydroxyzine Hcl 25 Mg Tabs (Hydroxyzine Hcl) .Marland Kitchen.. 1  Op Q 6 Hrs As Needed Nausea or Nerves 19)  Seroquel 100 Mg Tabs (Quetiapine Fumarate) .Marland Kitchen.. 1po At  Bedtime in Place of The Trazodone  Allergies (verified): 1)  ! * Actos 2)  Nsaids  Past History:  Past Medical History: Last updated: 05/26/2010 Allergic rhinitis Depression Diabetes mellitus, type II - Dr Meriel Flavors GERD Hyperlipidemia Hypertension Renal insufficiency - Dr Powell/trenal Seizure disorder Hypothyroidism Peripheral vascular disease Chronic Back Pain/lumbar disc disease/spinal stenosis - Dr Waldron Labs Heart Murmur COPD Peripheral Neuropathy with pain migraine Anemia-NOS Gout Peptic ulcer disease RLS Osteopenia COLON POLYPS-ADENOMATOUS Anxiety  Past Surgical History: Last updated: 09/22/2009 Left toe amputated (2006) Bunionectomy (1980) Goiter Removal (1997) Stress Cardiolite (06/18/2006) Tranthoracic Echocardiogram (06/18/2006) EKG (05/29/2006) Cholecystectomy Rotator cuff repair - left  - dr Lajoyce Corners  Social History: Last updated: 01/24/2010 Alcohol use-no Single - marriage annulled Current Smoker no children disabled - c-spine and back pain Drug use-no  Risk Factors: Smoking Status: current (09/17/2007)  Review of Systems       all otherwise negative per pt -  has ongoing bilat knee arthritic pains, not taking nsaids for pain  Physical Exam  General:  alert and overweight-appearing.   Head:   normocephalic and atraumatic.   Eyes:  vision grossly intact, pupils equal, and pupils round.   Ears:  R ear normal and L ear normal.   Nose:  no external deformity and no nasal discharge.   Mouth:  no gingival abnormalities and pharynx pink and moist.   Neck:  supple and no masses.   Lungs:  normal respiratory effort, R decreased breath sounds, and L decreased breath sounds.   Heart:  normal rate and regular rhythm.   Abdomen:  soft, non-tender, and normal bowel sounds.   Msk:  no spine tender,  left shoudler with diffuse tender, no sweling, no rash and pain with abduction to 90 degrees only Extremities:  no edema, no erythema  Neurologic:  strength normal in all extremities and gait normal.     Impression & Recommendations:  Problem # 1:  SHOULDER PAIN, LEFT (ICD-719.41)  Her updated medication list for this problem includes:    Flexeril 5 Mg Tabs (Cyclobenzaprine hcl) .Marland Kitchen... 1 by mouth three times a day as needed    Hydrocodone-acetaminophen 7.5-325 Mg Tabs (Hydrocodone-acetaminophen) .Marland Kitchen... 1po four times per day as needed pain  c/w prob recurrent rot cuff tear;  for MRI and pt plans to f/u with Dr Lajoyce Corners (will make appt herself)  Orders: Radiology Referral (Radiology)  Problem # 2:  HYPOTHYROIDISM (ICD-244.9)  Her updated medication list for this problem includes:    Levothyroxine Sodium 112 Mcg Tabs (Levothyroxine sodium) .Marland Kitchen... 1 by mouth once daily  Labs Reviewed: TSH: 0.36 (01/24/2010)    HgBA1c: 6.4 (01/24/2010) Chol: 176 (01/24/2010)   HDL: 55.00 (01/24/2010)   LDL: 52 (06/03/2009)   TG: 208.0 (01/24/2010) stable overall by hx and exam, ok to continue meds/tx as is , to check labs today  Problem # 3:  DIABETES MELLITUS, TYPE II (ICD-250.00)  Her updated medication list for this problem includes:    Glimepiride 1 Mg Tabs (Glimepiride) .Marland Kitchen... Take 1 by mouth qd    Januvia 100 Mg Tabs (Sitagliptin phosphate) .Marland Kitchen... 1/4 by mouth once daily  Labs Reviewed: Creat: 3.5  (01/24/2010)    Reviewed HgBA1c results: 6.4 (01/24/2010)  6.0 (06/03/2009) stable overall by hx and exam, ok to continue meds/tx as is , Pt to cont DM diet, excercise, wt control efforts; to check labs today   Problem # 4:  HYPERLIPIDEMIA (ICD-272.4)  Her updated medication list for this problem includes:    Lipitor 40  Mg Tabs (Atorvastatin calcium) .Marland Kitchen... 1po once daily  Labs Reviewed: SGOT: 26 (09/17/2007)   SGPT: 25 (09/17/2007)   HDL:55.00 (01/24/2010), 57.90 (06/03/2009)  LDL:52 (06/03/2009), DEL (16/05/9603)  Chol:176 (01/24/2010), 128 (06/03/2009)  Trig:208.0 (01/24/2010), 93.0 (06/03/2009) stable overall by hx and exam, ok to continue meds/tx as is , Pt to continue diet efforts, good med tolerance; to check labs - goal LDL less than 70   Complete Medication List: 1)  Glimepiride 1 Mg Tabs (Glimepiride) .... Take 1 by mouth qd 2)  Levothyroxine Sodium 112 Mcg Tabs (Levothyroxine sodium) .Marland Kitchen.. 1 by mouth once daily 3)  Allopurinol 100 Mg Tabs (Allopurinol) .Marland Kitchen.. 1 by mouth once daily 4)  Furosemide 80 Mg Tabs (Furosemide) .... 3 by mouth once daily 5)  Proair Hfa 108 (90 Base) Mcg/act Aers (Albuterol sulfate) .... 2 puffs up to four times per day as needed 6)  Cymbalta 60 Mg Cpep (Duloxetine hcl) .Marland Kitchen.. 1 by mouth once daily 7)  Januvia 100 Mg Tabs (Sitagliptin phosphate) .... 1/4 by mouth once daily 8)  Flexeril 5 Mg Tabs (Cyclobenzaprine hcl) .Marland Kitchen.. 1 by mouth three times a day as needed 9)  Clonazepam 2 Mg Tabs (Clonazepam) .Marland Kitchen.. 1po two times a day as needed 10)  Spiriva Handihaler 18 Mcg Caps (Tiotropium bromide monohydrate) .Marland Kitchen.. 1 puff once daily 11)  Lipitor 40 Mg Tabs (Atorvastatin calcium) .Marland Kitchen.. 1po once daily 12)  Protonix 40 Mg Tbec (Pantoprazole sodium) .Marland Kitchen.. 1po once daily 13)  Promethazine Hcl 25 Mg Tabs (Promethazine hcl) .Marland Kitchen.. 1 by mouth every 6 hours as needed 14)  Lyrica 50 Mg Caps (Pregabalin) .... 2 by mouth three times a day 15)  Hydrocodone-acetaminophen 7.5-325 Mg  Tabs (Hydrocodone-acetaminophen) .Marland Kitchen.. 1po four times per day as needed pain 16)  Trazodone Hcl 100 Mg Tabs (Trazodone hcl) .Marland Kitchen.. 1po at bedtime as needed (when does not have seroquel) 17)  Colcrys 0.6 Mg Tabs (Colchicine) .Marland Kitchen.. 1po once daily 18)  Hydroxyzine Hcl 25 Mg Tabs (Hydroxyzine hcl) .Marland Kitchen.. 1  op q 6 hrs as needed nausea or nerves 19)  Seroquel 100 Mg Tabs (Quetiapine fumarate) .Marland Kitchen.. 1po at bedtime in place of the trazodone  Other Orders: T-Vitamin D (25-Hydroxy) [phone])  Patient Instructions: 1)  Continue all previous medications as before this visit  2)  Please go to the Lab in the basement for your blood and/or urine tests today 3)  Please call the number on the [facility] for results of your testing 4)  We will fax results to Dr Lowell Guitar  5)  You are given some samples today of the cymbalta and the seroquel 6)  You will be contacted about the referral(s) to: MRI for the left shoulder 7)  Please call Dr Lajoyce Corners for appt for left shoulder evaluation 8)  Please schedule a follow-up appointment in 6 months, or sooner if needed   Orders Added: 1)  T-Vitamin D (25-Hydroxy) [[id number]] 2)  Radiology Referral [Radiology] 3)  Est. Patient Level IV [86578]

## 2010-09-07 NOTE — Assessment & Plan Note (Signed)
Summary: consult for insomnia, poor sleep hygiene.   Copy to:  Oliver Barre Primary Provider/Referring Provider:  Oliver Barre MD  CC:  Sleep Consult.  History of Present Illness: The pt is a 70y/o female who I have been asked to see for issues with sleep.  The pt states she has issues with sleep onset and maintenance, and this has been going on for years.  She states that she is an avid reader, and will go to bed around 8-10pm and reads in bed.  She will often read until 2-4am, and doesn't realize how much time has passed.  She also has tv shows at midnight that she wants to watch.  She will awaken at 7-8am to start her day, and denies being unrested.  When she does put her book down and turn off tv, she has issues going to sleep without meds.  She worries about her family and finances, and usually takes trazodone or seroquel.  This helps her get to sleep in a reasonable time period.  If she awakens during the night, she will typically turn on tv for awhile and then goes back to sleep.  The pt states that she cannot sleep during the day.  The pt does admit to having snoring, but no one has mentioned an abnormal breathing pattern during sleep.  She apparently has had a sleep study a few years ago, and was told she did not have sleep apnea.    Current Medications (verified): 1)  Glimepiride 1 Mg  Tabs (Glimepiride) .... Take 1 By Mouth Qd 2)  Levothyroxine Sodium 125 Mcg Tabs (Levothyroxine Sodium) .Marland Kitchen.. 1po Once Daily 3)  Allopurinol 100 Mg Tabs (Allopurinol) .Marland Kitchen.. 1 By Mouth Once Daily 4)  Furosemide 80 Mg  Tabs (Furosemide) .... 3 By Mouth Once Daily 5)  Proair Hfa 108 (90 Base) Mcg/act Aers (Albuterol Sulfate) .... 2 Puffs Up To Four Times Per Day As Needed 6)  Cymbalta 60 Mg Cpep (Duloxetine Hcl) .Marland Kitchen.. 1 By Mouth Once Daily 7)  Januvia 100 Mg  Tabs (Sitagliptin Phosphate) .... 1/4 By Mouth Once Daily 8)  Flexeril 5 Mg Tabs (Cyclobenzaprine Hcl) .Marland Kitchen.. 1 By Mouth Three Times A Day As Needed 9)   Clonazepam 2 Mg Tabs (Clonazepam) .Marland Kitchen.. 1po Two Times A Day As Needed 10)  Spiriva Handihaler 18 Mcg Caps (Tiotropium Bromide Monohydrate) .Marland Kitchen.. 1 Puff Once Daily 11)  Lipitor 40 Mg Tabs (Atorvastatin Calcium) .Marland Kitchen.. 1po Once Daily 12)  Protonix 40 Mg Tbec (Pantoprazole Sodium) .Marland Kitchen.. 1po Once Daily 13)  Promethazine Hcl 25 Mg Tabs (Promethazine Hcl) .Marland Kitchen.. 1 By Mouth Every 6 Hours As Needed 14)  Lyrica 50 Mg Caps (Pregabalin) .... Take 1 Tab By Mouth At Bedtime 15)  Hydrocodone-Acetaminophen 7.5-325 Mg Tabs (Hydrocodone-Acetaminophen) .Marland Kitchen.. 1po Four Times Per Day As Needed Pain 16)  Trazodone Hcl 100 Mg Tabs (Trazodone Hcl) .Marland Kitchen.. 1po At Bedtime As Needed (When Does Not Have Seroquel) 17)  Colcrys 0.6 Mg Tabs (Colchicine) .Marland Kitchen.. 1po Once Daily 18)  Hydroxyzine Hcl 25 Mg Tabs (Hydroxyzine Hcl) .Marland Kitchen.. 1  Op Q 6 Hrs As Needed Nausea or Nerves 19)  Seroquel 100 Mg Tabs (Quetiapine Fumarate) .Marland Kitchen.. 1po At Bedtime in Place of The Trazodone 20)  Tussionex Pennkinetic Er 10-8 Mg/11ml Lqcr (Hydrocod Polst-Chlorphen Polst) .Marland Kitchen.. 1 Tsp By Mouth Two Times A Day As Needed Cough  Allergies (verified): 1)  ! * Actos 2)  Nsaids  Past History:  Past Medical History: Reviewed history from 05/26/2010 and no changes required. Allergic  rhinitis Depression Diabetes mellitus, type II - Dr Meriel Flavors GERD Hyperlipidemia Hypertension Renal insufficiency - Dr Powell/trenal Seizure disorder Hypothyroidism Peripheral vascular disease Chronic Back Pain/lumbar disc disease/spinal stenosis - Dr Waldron Labs Heart Murmur COPD Peripheral Neuropathy with pain migraine Anemia-NOS Gout Peptic ulcer disease RLS Osteopenia COLON POLYPS-ADENOMATOUS Anxiety  Past Surgical History: Reviewed history from 09/22/2009 and no changes required. Left toe amputated (2006) Bunionectomy (1980) Goiter Removal (1997) Stress Cardiolite (06/18/2006) Tranthoracic Echocardiogram (06/18/2006) EKG (05/29/2006) Cholecystectomy Rotator cuff  repair - left  - dr Lajoyce Corners  Family History: Reviewed history from 07/23/2008 and no changes required. Family History of CAD Female 1st degree relative <60 Family History High cholesterol Family History Hypertension Family History Ovarian cancer Family History of Stroke F 1st degree relative <60 mother with dementia  Social History: Reviewed history from 01/24/2010 and no changes required. Alcohol use-no Single and lives alone - marriage annulled Current Smoker.  started 1965.  1/2 ppd.   no children disabled - c-spine and back pain.  prev worked as a Programmer, systems.  Drug use-no  Review of Systems       The patient complains of shortness of breath with activity, shortness of breath at rest, productive cough, non-productive cough, chest pain, acid heartburn, indigestion, loss of appetite, difficulty swallowing, tooth/dental problems, headaches, sneezing, anxiety, depression, and joint stiffness or pain.  The patient denies coughing up blood, irregular heartbeats, weight change, abdominal pain, sore throat, nasal congestion/difficulty breathing through nose, itching, ear ache, hand/feet swelling, rash, change in color of mucus, and fever.    Vitals Entered By: Arman Filter LPN (August 22, 2010 11:50 AM) CC: Sleep Consult Comments Medications reviewed with patient Arman Filter LPN  August 22, 2010 11:50 AM    Physical Exam  General:  ow female in nad Eyes:  PERRLA and EOMI.   Nose:  patent without discharge. no purulence or crusting noted. Mouth:  no significant narrowing or obstruction, no exudates. Neck:  no jvd, tmg, LN Lungs:  faint basilar crackles, no wheezing or rhonchi  Heart:  rrr, 3/6 sem Abdomen:  soft and nontender, bs+ Extremities:  minimal right ankle edema, no cyanosis  pulses intact distally Neurologic:  alert and oriented, moves all 4.   Impression & Recommendations:  Problem # 1:  INADEQUATE SLEEP HYGIENE (ICD-307.49) the pt has significant sleep hygiene  issues.  She does not want to go to bed earlier due to TV shows and reading in bed.  She will never get better until she puts a priority on good qualilty and quantity of sleep.  I have reviewed with her the various issues that she needs to correct, and she states that she will give it some thought.  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  This is primarily caused by her terrible sleep hygiene.  I have reviewed behavioral therapy with her, specifically stimulus control therapy.  Once she begins sleeping better, would like to get her off her sleep meds.  Medications Added to Medication List This Visit: 1)  Lyrica 50 Mg Caps (Pregabalin) .... Take 1 tab by mouth at bedtime  Other Orders: Consultation Level V (16109)  Patient Instructions: 1)  you have to make the decision that you really want to go to bed earlier, and that you are willing to give up some tv and reading to do so.   2)  would try and establish your sleep schedule 1AM to 7AM.  Do not do anything in bedroom but sleep...no tv or reading.  If you cannot fall asleep in , leave bedroom and go to family room to watch tv or read.  When you get sleepy, try going by to bedroom to initiate sleep. Do this as many times as necessary until you fall asleep or get to 7am. 3)  Would try the above schedule with your sleep medications for a few weeks until you are able to sleep thru the night.  Then you can start cutting back on their use, and ultimately stop taking them.   4)  try a night light instead of leaving your bedside lamp on.

## 2010-09-07 NOTE — Progress Notes (Signed)
Summary: pt request  Phone Note Call from Patient   Caller: Patient Summary of Call: pt called stating that she has been having nausea and reflux x 3 weeks. pt states that she cannot afford Rx for Protonix and is requesting a generic and a Rx for Phenergan as well. Initial call taken by: Margaret Pyle, CMA,  August 22, 2009 3:15 PM  Follow-up for Phone Call        ok for omeprazole 20 mg per day, and phenergan 25 mg tab  - 1 by mouth q 6 hrs as needed #30 , no refill, to [facility] Follow-up by: Corwin Levins MD,  August 22, 2009 4:01 PM    New/Updated Medications: OMEPRAZOLE 20 MG CPDR (OMEPRAZOLE) 1 by mouth once daily PROMETHAZINE HCL 25 MG TABS (PROMETHAZINE HCL) 1 by mouth every 6 hours as needed Prescriptions: PROMETHAZINE HCL 25 MG TABS (PROMETHAZINE HCL) 1 by mouth every 6 hours as needed  #30 x 0   Entered by:   Scharlene Gloss   Authorized by:   Corwin Levins MD   Signed by:   Scharlene Gloss on 08/22/2009   Method used:   F[facility]ed to ...       [facility]       [address]       [place], [place]  [address]       Ph: [phone]       Fax: [phone]   RxID:   [id number] OMEPRAZOLE 20 MG CPDR (OMEPRAZOLE) 1 by mouth once daily  #30 x 3   Entered by:   Scharlene Gloss   Authorized by:   Corwin Levins MD   Signed by:   Scharlene Gloss on 08/22/2009   Method used:   Faxed to ...       [facility]       [address]       [place], [place]  [address]       Ph: [phone]       Fax: [phone]   RxID:   [id number]

## 2010-09-07 NOTE — Medication Information (Signed)
Summary: Patient Assistance/[employer]  Patient Assistance/[employer]   Imported By: Lester [place] Olivos 09/26/2009 08:57:34  _____________________________________________________________________  External Attachment:    Type:   Image     Comment:   External Document

## 2010-09-07 NOTE — Miscellaneous (Signed)
Summary: Orders Update  Clinical Lists Changes  Orders: Added new Referral order of Nephrology Referral (Nephro) - Signed 

## 2010-09-07 NOTE — Letter (Signed)
Summary: [facility]   Imported By: Lennie Odor 08/15/2010 10:44:55  _____________________________________________________________________  External Attachment:    Type:   Image     Comment:   External Document

## 2010-09-07 NOTE — Assessment & Plan Note (Signed)
Summary: Abd pain/ colon last week/dfs   History of Present Illness Visit Type: Follow-up Visit Primary GI MD: Sheryn Bison MD FACP FAGA Primary Provider: Oliver Barre MD Chief Complaint: abd pain colon last week History of Present Illness:   70 KNOWN TO DR. Christella Hartigan WHO UNDERWENT COLONOSCOPY ON 11/10/09. SHE HAD 3 POLYPS REMOVED WITH SNARE FROM HER TRANSVERSE COLON,NO CAUTERY. PATH CONSISTENT WITHTUBULAR ADENOMAS. SHE COMES IN TODAY AFTER CALLING YESTERDAY WITH C/O LOWER ABDOMINAL PAIN POST PROCEDURE. SHE SAYS SHE WOKE UP WITH IT THE DAY AFTER THE COLONOSCOPY AND IT HAS BEEN PERSISTENT, CONSTANT,DULL ACROSS LOWER ABD.. NO N/V,NO CHILLS ,FEVER,SWEATS. SHE HAD A LOOSEBM YESTERDAY, NO MELENA OR HEME.. SHE GENERALLY IS USING OXYCONTIN OR OXYCODONE FOR CHRONIC BACK PAIN BUT SAYS SHE HAS RUN OUT AND HAS NO MONEY.   GI Review of Systems    Reports abdominal pain.     Location of  Abdominal pain: lower abdomen.    Denies acid reflux, belching, bloating, chest pain, dysphagia with liquids, dysphagia with solids, heartburn, loss of appetite, nausea, vomiting, vomiting blood, and  weight loss.      Reports change in bowel habits.     Denies anal fissure, black tarry stools, constipation, diarrhea, diverticulosis, fecal incontinence, heme positive stool, hemorrhoids, irritable bowel syndrome, jaundice, light color stool, liver problems, rectal bleeding, and  rectal pain.    Current Medications (verified): 1)  Glimepiride 1 Mg  Tabs (Glimepiride) .... Take 1 By Mouth Qd 2)  Seroquel 100 Mg  Tabs (Quetiapine Fumarate) .Marland Kitchen.. 1 By Mouth At Bedtime 3)  Levothyroxine Sodium 112 Mcg Tabs (Levothyroxine Sodium) .Marland Kitchen.. 1 By Mouth Once Daily 4)  Allopurinol 100 Mg Tabs (Allopurinol) .Marland Kitchen.. 1 By Mouth Once Daily 5)  Furosemide 80 Mg  Tabs (Furosemide) .... 3 By Mouth Once Daily 6)  Proair Hfa 108 (90 Base) Mcg/act Aers (Albuterol Sulfate) .... 2 Puffs Up To Four Times Per Day As Needed 7)  Cymbalta 60 Mg Cpep  (Duloxetine Hcl) .Marland Kitchen.. 1 By Mouth Once Daily 8)  Januvia 100 Mg  Tabs (Sitagliptin Phosphate) .... 1/4 By Mouth Once Daily 9)  Tizanidine Hcl 4 Mg Tabs (Tizanidine Hcl) .Marland Kitchen.. 1 By Mouth Two Times A Day As Needed 10)  Clonazepam 2 Mg Tabs (Clonazepam) .Marland Kitchen.. 1po Two Times A Day 11)  Oxycodone Hcl 5 Mg Tabs (Oxycodone Hcl) .Marland Kitchen.. 1 By Mouth Qid As Needed Per Pain Management 12)  Spiriva Handihaler 18 Mcg Caps (Tiotropium Bromide Monohydrate) .Marland Kitchen.. 1 Puff Once Daily 13)  Lipitor 40 Mg Tabs (Atorvastatin Calcium) .Marland Kitchen.. 1po Once Daily 14)  Protonix 40 Mg Tbec (Pantoprazole Sodium) .Marland Kitchen.. 1po Once Daily 15)  Promethazine Hcl 25 Mg Tabs (Promethazine Hcl) .Marland Kitchen.. 1 By Mouth Every 6 Hours As Needed 16)  Wellbutrin Sr 150 Mg Xr12h-Tab (Bupropion Hcl) .Marland Kitchen.. 1 By Mouth Once Daily 17)  Tramadol Hcl 50 Mg Tabs (Tramadol Hcl) .Marland Kitchen.. 1po Two Times A Day  - Per Pain Md 18)  Oxycontin 10 Mg Xr12h-Tab (Oxycodone Hcl) .Marland Kitchen.. 1 By Mouth Two Times A Day - Per [facility] Md 19)  Lyrica 50 Mg Caps (Pregabalin) .... 2 By Mouth Three Times A Day  Allergies (verified): 1)  ! * Actos 2)  Nsaids  Past History:  Past Medical History: Allergic rhinitis Depression Diabetes mellitus, type II GERD Hyperlipidemia Hypertension Renal insufficiency Seizure disorder Hypothyroidism Peripheral vascular disease Chronic Back Pain/lumbar disc disease/spinal stenosis Heart Murmur COPD Peripheral Neuropathy with pain migraine Anemia-NOS Gout Peptic ulcer disease RLS Osteopenia COLON POLYPS-ADENOMATOUS  Past Surgical History: Reviewed history from 09/22/2009 and no changes required. Left toe amputated (2006) Bunionectomy (1980) Goiter Removal (1997) Stress Cardiolite (06/18/2006) Tranthoracic Echocardiogram (06/18/2006) EKG (05/29/2006) Cholecystectomy Rotator cuff repair - left  - dr Lajoyce Corners  Family History: Reviewed history from 07/23/2008 and no changes required. Family History of CAD Female 1st degree relative <60 Family  History High cholesterol Family History Hypertension Family History Ovarian cancer Family History of Stroke F 1st degree relative <60 mother with dementia  Social History: Reviewed history from 12/02/2007 and no changes required. Alcohol use-no Single - marriage annulled Current Smoker no children disabled - c-spine and back pain  Review of Systems  The patient denies allergy/sinus, anemia, anxiety-new, arthritis/joint pain, back pain, blood in urine, breast changes/lumps, change in vision, confusion, cough, coughing up blood, depression-new, fainting, fatigue, fever, headaches-new, hearing problems, heart murmur, heart rhythm changes, itching, menstrual pain, muscle pains/cramps, night sweats, nosebleeds, pregnancy symptoms, shortness of breath, skin rash, sleeping problems, sore throat, swelling of feet/legs, swollen lymph glands, thirst - excessive , urination - excessive , urination changes/pain, urine leakage, vision changes, and voice change.         ROS OTHERWISE AS IN HPI  Vital Signs:  Patient profile:   70 year old female Height:      67 inches Weight:      168 pounds BMI:     26.41 Pulse rate:   70 / minute Pulse rhythm:   regular BP sitting:   130 / 60  (left arm)  Vitals Entered By: Chales Abrahams CMA Duncan Dull) (November 16, 2009 1:35 PM)  Physical Exam  General:  Well developed, well nourished, no acute distress. Head:  Normocephalic and atraumatic. Eyes:  PERRLA, no icterus. Lungs:  Clear throughout to auscultation. Heart:  Regular rate and rhythm; no murmurs, rubs,  or bruits. Abdomen:  SOFT,MINIMALLY TENDER,ACROSS LOER ABDOMEN,NONFOCAL-NO MASS OR HSM,BS+ Rectal:  NOT DONE Extremities:  No clubbing, cyanosis, edema or deformities noted. Neurologic:  Alert and  oriented x4;  grossly normal neurologically. Psych:  Alert and cooperative. Normal mood and affect.   Impression & Recommendations:  Problem # 1:  ABDOMINAL PAIN -GENERALIZED (ICD-789.07) Assessment  New 70 YO FEMALE  6 DAYS S/P COLONOSCOPY WITH SNARE POLYPECTOMY X3 ;TRANSVERSE COLON WITH C/O PERSISTENT LOWER ABDOMINAL PAIN. HER EXAM IS BENIGN,CANNOT R/O MILD INFLAMMATORY PROCESS POST POLYPECTOMY.  CBC TODAY CIPRO 500 MG TWICE DAILY X 7 DAYS VICODEN 5/500 Q 4-6 HOURS AS NEEDED FOR PAIN # 20/0 REFILLS FOLLOW UP WITH DR. Christella Hartigan  AS NEEDED,PT ADVISED TO CALL BACK IF PAIN DOES NOT RESOLVE WITHIN THE NEXT FEW DAYS.  Problem # 2:  PERSONAL HX COLONIC POLYPS (ICD-V12.72) Assessment: Comment Only ADENOMATOUS-COLONOSCOPY 4/11  Problem # 3:  COPD (ICD-496) Assessment: Comment Only  Problem # 4:  PERIPHERAL VASCULAR DISEASE (ICD-443.9) Assessment: Comment Only  Problem # 5:  RENAL INSUFFICIENCY (ICD-588.9) Assessment: Comment Only  Problem # 6:  HYPERTENSION (ICD-401.9) Assessment: Comment Only  Problem # 7:  DIABETES MELLITUS, TYPE II (ICD-250.00) Assessment: Comment Only  Patient Instructions: 1)  We sent prescription for Cipro to [facility]. 2)  We faxed the presciption for Vicodin also. 3)  Call back and schedule appt to be seen as needed. 4)  Copy sent to : Oliver Barre, MD 5)  The medication list was reviewed and reconciled.  All changed / newly prescribed medications were explained.  A complete medication list was provided to the patient / caregiver. Prescriptions: CIPRO 500 MG TABS (CIPROFLOXACIN HCL)  Take 1 tab twice daily x 7 days  #14 x 0   Entered by:   Lowry Ram NCMA   Authorized by:   Sammuel Cooper PA-c   Signed by:   Lowry Ram NCMA on 11/16/2009   Method used:   Electronically to        [facility] (retail)       [address]       [place], [place]  [address]       Ph: [phone]       Fax: [phone]   RxID:   [id number] VICODIN 5-500 MG TABS (HYDROCODONE-ACETAMINOPHEN) Take 1 tab every 6  hours as needed for pain  #15 x 0   Entered by:   Lowry Ram NCMA   Authorized by:   Sammuel Cooper PA-c   Signed  by:   Lowry Ram NCMA on 11/16/2009   Method used:   Printed then faxed to ...       [facility]       [address]       [place], [place]  [address]       Ph: [phone]       Fax: [phone]   RxID:   [id number]

## 2010-09-07 NOTE — Assessment & Plan Note (Signed)
Summary: lost voice due to coughing-DBD   Vital Signs:  Patient profile:   70 year old female Height:      67.5 inches Weight:      195.38 pounds BMI:     30.26 O2 Sat:      96 % on Room air Temp:     98.8 degrees F oral Pulse rate:   96 / minute BP sitting:   152 / 70  (left arm) Cuff size:   regular  Vitals Entered By: Zella Ball Ewing CMA Duncan Dull) (August 30, 2010 1:41 PM)  O2 Flow:  Room air CC: Cough and congestion/RE   Primary Care Provider:  Oliver Barre MD  CC:  Cough and congestion/RE.  History of Present Illness: here to f/u with acute;  overall was doing well until approx 3 days ago with onset fever, mild ST, hoarseness, and no increasingly prod cough with greenish sputum and mild wheezing better with her inhaler, Pt denies CP, worsening sob, doe, wheezing, orthopnea, pnd, worsening LE edema, palps, dizziness or syncope   Pt denies new neuro symptoms such as headache, facial or extremity weakness  Pt denies polydipsia, polyuria  Overall fair compliance with meds when she can afford them,  trying to follow low chol, DM diet, wt stable, little excercise however .  BP at home has been < 140/90.    Problems Prior to Update: 1)  Dyspnea  (ICD-786.05) 2)  Persistent Disorder Initiating/maintaining Sleep  (ICD-307.42) 3)  Inadequate Sleep Hygiene  (ICD-307.49) 4)  Chest Pain  (ICD-786.50) 5)  Wheezing  (ICD-786.07) 6)  Bronchitis-acute  (ICD-466.0) 7)  Hepatotoxicity, Drug-induced, Risk of  (ICD-V58.69) 8)  Shoulder Pain, Left  (ICD-719.41) 9)  Hypersomnia  (ICD-780.54) 10)  Anxiety  (ICD-300.00) 11)  Memory Loss  (ICD-780.93) 12)  Back Pain  (ICD-724.5) 13)  Abdominal Pain, Lower  (ICD-789.09) 14)  Colonic Polyps, Hx of  (ICD-V12.72) 15)  Abdominal Pain -generalized  (ICD-789.07) 16)  Personal Hx Colonic Polyps  (ICD-V12.72) 17)  Preventive Health Care  (ICD-V70.0) 18)  Osteopenia  (ICD-733.90) 19)  Health Screening  (ICD-V70.0) 20)  Inguinal Pain, Right   (ICD-789.09) 21)  Menopausal Disorder  (ICD-627.9) 22)  Ear Pain  (ICD-388.70) 23)  Asthmatic Bronchitis, Acute  (ICD-466.0) 24)  Shoulder Pain, Left, Chronic  (ICD-719.41) 25)  Osteoarthritis, Knees, Bilateral  (ICD-715.96) 26)  Cervical Radiculopathy, Left  (ICD-723.4) 27)  Osteoarthritis, Knee, Right  (ICD-715.96) 28)  Gastroenteritis  (ICD-558.9) 29)  Fatigue  (ICD-780.79) 30)  Cigarette Smoker  (ICD-305.1) 31)  Abnormality of Gait  (ICD-781.2) 32)  Fever Unspecified  (ICD-780.60) 33)  Family History of Cad Female 1st Degree Relative <60  (ICD-V16.49) 34)  Restless Leg Syndrome  (ICD-333.94) 35)  Peptic Ulcer Disease  (ICD-533.90) 36)  Gout  (ICD-274.9) 37)  Anemia-nos  (ICD-285.9) 38)  Common Migraine  (ICD-346.10) 39)  Peripheral Neuropathy  (ICD-356.9) 40)  COPD  (ICD-496) 41)  Peripheral Vascular Disease  (ICD-443.9) 42)  Hypothyroidism  (ICD-244.9) 43)  Seizure Disorder  (ICD-780.39) 44)  Renal Insufficiency  (ICD-588.9) 45)  Hypertension  (ICD-401.9) 46)  Hyperlipidemia  (ICD-272.4) 47)  Gerd  (ICD-530.81) 48)  Diabetes Mellitus, Type II  (ICD-250.00) 49)  Depression  (ICD-311) 50)  Allergic Rhinitis  (ICD-477.9)  Medications Prior to Update: 1)  Glimepiride 1 Mg  Tabs (Glimepiride) .... Take 1 By Mouth Qd 2)  Levothyroxine Sodium 125 Mcg Tabs (Levothyroxine Sodium) .Marland Kitchen.. 1po Once Daily 3)  Allopurinol 100 Mg Tabs (Allopurinol) .Marland Kitchen.. 1 By Mouth Once  Daily 4)  Furosemide 80 Mg  Tabs (Furosemide) .... 3 By Mouth Once Daily 5)  Proair Hfa 108 (90 Base) Mcg/act Aers (Albuterol Sulfate) .... 2 Puffs Up To Four Times Per Day As Needed 6)  Cymbalta 60 Mg Cpep (Duloxetine Hcl) .Marland Kitchen.. 1 By Mouth Once Daily 7)  Januvia 100 Mg  Tabs (Sitagliptin Phosphate) .... 1/4 By Mouth Once Daily 8)  Flexeril 5 Mg Tabs (Cyclobenzaprine Hcl) .Marland Kitchen.. 1 By Mouth Three Times A Day As Needed 9)  Clonazepam 2 Mg Tabs (Clonazepam) .Marland Kitchen.. 1po Two Times A Day As Needed 10)  Spiriva Handihaler 18 Mcg Caps  (Tiotropium Bromide Monohydrate) .Marland Kitchen.. 1 Puff Once Daily 11)  Lipitor 40 Mg Tabs (Atorvastatin Calcium) .Marland Kitchen.. 1po Once Daily 12)  Protonix 40 Mg Tbec (Pantoprazole Sodium) .Marland Kitchen.. 1po Once Daily 13)  Promethazine Hcl 25 Mg Tabs (Promethazine Hcl) .Marland Kitchen.. 1 By Mouth Every 6 Hours As Needed 14)  Lyrica 50 Mg Caps (Pregabalin) .... Take 1 Tab By Mouth At Bedtime 15)  Hydrocodone-Acetaminophen 7.5-325 Mg Tabs (Hydrocodone-Acetaminophen) .Marland Kitchen.. 1po Four Times Per Day As Needed Pain 16)  Trazodone Hcl 100 Mg Tabs (Trazodone Hcl) .Marland Kitchen.. 1po At Bedtime As Needed (When Does Not Have Seroquel) 17)  Colcrys 0.6 Mg Tabs (Colchicine) .Marland Kitchen.. 1po Once Daily 18)  Hydroxyzine Hcl 25 Mg Tabs (Hydroxyzine Hcl) .Marland Kitchen.. 1  Op Q 6 Hrs As Needed Nausea or Nerves 19)  Seroquel 100 Mg Tabs (Quetiapine Fumarate) .Marland Kitchen.. 1po At Bedtime in Place of The Trazodone 20)  Tussionex Pennkinetic Er 10-8 Mg/50ml Lqcr (Hydrocod Polst-Chlorphen Polst) .Marland Kitchen.. 1 Tsp By Mouth Two Times A Day As Needed Cough  Current Medications (verified): 1)  Glimepiride 1 Mg  Tabs (Glimepiride) .... Take 1 By Mouth Qd 2)  Levothyroxine Sodium 125 Mcg Tabs (Levothyroxine Sodium) .Marland Kitchen.. 1po Once Daily 3)  Allopurinol 100 Mg Tabs (Allopurinol) .Marland Kitchen.. 1 By Mouth Once Daily 4)  Furosemide 80 Mg  Tabs (Furosemide) .... 3 By Mouth Once Daily 5)  Proair Hfa 108 (90 Base) Mcg/act Aers (Albuterol Sulfate) .... 2 Puffs Up To Four Times Per Day As Needed 6)  Cymbalta 60 Mg Cpep (Duloxetine Hcl) .Marland Kitchen.. 1 By Mouth Once Daily 7)  Januvia 100 Mg  Tabs (Sitagliptin Phosphate) .... 1/4 By Mouth Once Daily 8)  Flexeril 5 Mg Tabs (Cyclobenzaprine Hcl) .Marland Kitchen.. 1 By Mouth Three Times A Day As Needed 9)  Clonazepam 2 Mg Tabs (Clonazepam) .Marland Kitchen.. 1po Two Times A Day As Needed 10)  Spiriva Handihaler 18 Mcg Caps (Tiotropium Bromide Monohydrate) .Marland Kitchen.. 1 Puff Once Daily 11)  Lipitor 40 Mg Tabs (Atorvastatin Calcium) .Marland Kitchen.. 1po Once Daily 12)  Protonix 40 Mg Tbec (Pantoprazole Sodium) .Marland Kitchen.. 1po Once Daily 13)   Promethazine Hcl 25 Mg Tabs (Promethazine Hcl) .Marland Kitchen.. 1 By Mouth Every 6 Hours As Needed 14)  Lyrica 50 Mg Caps (Pregabalin) .... Take 1 Tab By Mouth At Bedtime 15)  Hydrocodone-Acetaminophen 7.5-325 Mg Tabs (Hydrocodone-Acetaminophen) .Marland Kitchen.. 1po Four Times Per Day As Needed Pain 16)  Trazodone Hcl 100 Mg Tabs (Trazodone Hcl) .Marland Kitchen.. 1po At Bedtime As Needed (When Does Not Have Seroquel) 17)  Colcrys 0.6 Mg Tabs (Colchicine) .Marland Kitchen.. 1po Once Daily 18)  Hydroxyzine Hcl 25 Mg Tabs (Hydroxyzine Hcl) .Marland Kitchen.. 1  Op Q 6 Hrs As Needed Nausea or Nerves 19)  Seroquel 100 Mg Tabs (Quetiapine Fumarate) .Marland Kitchen.. 1po At Bedtime in Place of The Trazodone 20)  Tessalon Perles 100 Mg Caps (Benzonatate) .Marland Kitchen.. 1-2 By Mouth Three Times A Day As Needed Cough 21)  Doxycycline Hyclate 100 Mg Caps (Doxycycline Hyclate) .Marland Kitchen.. 1po Two Times A Day  Allergies (verified): 1)  ! * Actos 2)  Nsaids  Past History:  Past Medical History: Last updated: 05/26/2010 Allergic rhinitis Depression Diabetes mellitus, type II - Dr Meriel Flavors GERD Hyperlipidemia Hypertension Renal insufficiency - Dr Powell/trenal Seizure disorder Hypothyroidism Peripheral vascular disease Chronic Back Pain/lumbar disc disease/spinal stenosis - Dr Waldron Labs Heart Murmur COPD Peripheral Neuropathy with pain migraine Anemia-NOS Gout Peptic ulcer disease RLS Osteopenia COLON POLYPS-ADENOMATOUS Anxiety  Past Surgical History: Last updated: 09/22/2009 Left toe amputated (2006) Bunionectomy (1980) Goiter Removal (1997) Stress Cardiolite (06/18/2006) Tranthoracic Echocardiogram (06/18/2006) EKG (05/29/2006) Cholecystectomy Rotator cuff repair - left  - dr Lajoyce Corners  Social History: Last updated: 01/24/2010 Alcohol use-no Single - marriage annulled Current Smoker no children disabled - c-spine and back pain Drug use-no  Risk Factors: Smoking Status: current (09/17/2007)  Review of Systems       all otherwise negative per pt -    Physical  Exam  General:  alert and overweight-appearing.  , mild ill  Head:  normocephalic and atraumatic.   Eyes:  vision grossly intact, pupils equal, and pupils round.   Ears:  bilat tms' with midl erythema, canals clear, sinus nontender Nose:  nasal dischargemucosal pallor and mucosal edema.   Mouth:  pharyngeal erythema and fair dentition.   Neck:  supple and cervical lymphadenopathy.   Lungs:  normal respiratory effort, R decreased breath sounds, and L decreased breath sounds., but no wheezing Heart:  normal rate and regular rhythm.   Extremities:  no edema, no erythema    Impression & Recommendations:  Problem # 1:  BRONCHITIS-ACUTE (ICD-466.0)  Her updated medication list for this problem includes:    Proair Hfa 108 (90 Base) Mcg/act Aers (Albuterol sulfate) .Marland Kitchen... 2 puffs up to four times per day as needed    Spiriva Handihaler 18 Mcg Caps (Tiotropium bromide monohydrate) .Marland Kitchen... 1 puff once daily    Tessalon Perles 100 Mg Caps (Benzonatate) .Marland Kitchen... 1-2 by mouth three times a day as needed cough    Doxycycline Hyclate 100 Mg Caps (Doxycycline hyclate) .Marland Kitchen... 1po two times a day treat as above, f/u any worsening signs or symptoms   Problem # 2:  WHEEZING (ICD-786.07) exam benign, no wheezing on exam, to cont with the inhaler, will hold onsteroid tx at this time  Problem # 3:  HYPERTENSION (ICD-401.9)  Her updated medication list for this problem includes:    Furosemide 80 Mg Tabs (Furosemide) .Marland KitchenMarland KitchenMarland KitchenMarland Kitchen 3 by mouth once daily  BP today: 152/70 Prior BP: 142/68 (08/15/2010)  Labs Reviewed: K+: 4.2 (07/20/2010) Creat: : 3.0 (07/20/2010)   Chol: 140 (07/20/2010)   HDL: 45.50 (07/20/2010)   LDL: 69 (07/20/2010)   TG: 127.0 (07/20/2010) mild elev today, likely situational, ok to follow, continue same treatment   Problem # 4:  HYPERLIPIDEMIA (ICD-272.4)  Her updated medication list for this problem includes:    Lipitor 40 Mg Tabs (Atorvastatin calcium) .Marland Kitchen... 1po once daily  Labs  Reviewed: SGOT: 21 (07/20/2010)   SGPT: 14 (07/20/2010)   HDL:45.50 (07/20/2010), 55.00 (01/24/2010)  LDL:69 (07/20/2010), 52 (06/03/2009)  Chol:140 (07/20/2010), 176 (01/24/2010)  Trig:127.0 (07/20/2010), 208.0 (01/24/2010) d/w pt - has lipid panel with her done jan 2012 per endo with LDL > 170- followed per endo  - ? now on simvastatin but pt is not sure,  I asked pt to f/u with endo, and cont Pt to continue diet efforts, good med tolerance  Complete Medication List: 1)  Glimepiride 1 Mg Tabs (Glimepiride) .... Take 1 by mouth qd 2)  Levothyroxine Sodium 125 Mcg Tabs (Levothyroxine sodium) .Marland Kitchen.. 1po once daily 3)  Allopurinol 100 Mg Tabs (Allopurinol) .Marland Kitchen.. 1 by mouth once daily 4)  Furosemide 80 Mg Tabs (Furosemide) .... 3 by mouth once daily 5)  Proair Hfa 108 (90 Base) Mcg/act Aers (Albuterol sulfate) .... 2 puffs up to four times per day as needed 6)  Cymbalta 60 Mg Cpep (Duloxetine hcl) .Marland Kitchen.. 1 by mouth once daily 7)  Januvia 100 Mg Tabs (Sitagliptin phosphate) .... 1/4 by mouth once daily 8)  Flexeril 5 Mg Tabs (Cyclobenzaprine hcl) .Marland Kitchen.. 1 by mouth three times a day as needed 9)  Clonazepam 2 Mg Tabs (Clonazepam) .Marland Kitchen.. 1po two times a day as needed 10)  Spiriva Handihaler 18 Mcg Caps (Tiotropium bromide monohydrate) .Marland Kitchen.. 1 puff once daily 11)  Lipitor 40 Mg Tabs (Atorvastatin calcium) .Marland Kitchen.. 1po once daily 12)  Protonix 40 Mg Tbec (Pantoprazole sodium) .Marland Kitchen.. 1po once daily 13)  Promethazine Hcl 25 Mg Tabs (Promethazine hcl) .Marland Kitchen.. 1 by mouth every 6 hours as needed 14)  Lyrica 50 Mg Caps (Pregabalin) .... Take 1 tab by mouth at bedtime 15)  Hydrocodone-acetaminophen 7.5-325 Mg Tabs (Hydrocodone-acetaminophen) .Marland Kitchen.. 1po four times per day as needed pain 16)  Trazodone Hcl 100 Mg Tabs (Trazodone hcl) .Marland Kitchen.. 1po at bedtime as needed (when does not have seroquel) 17)  Colcrys 0.6 Mg Tabs (Colchicine) .Marland Kitchen.. 1po once daily 18)  Hydroxyzine Hcl 25 Mg Tabs (Hydroxyzine hcl) .Marland Kitchen.. 1  op q 6 hrs as needed  nausea or nerves 19)  Seroquel 100 Mg Tabs (Quetiapine fumarate) .Marland Kitchen.. 1po at bedtime in place of the trazodone 20)  Tessalon Perles 100 Mg Caps (Benzonatate) .Marland Kitchen.. 1-2 by mouth three times a day as needed cough 21)  Doxycycline Hyclate 100 Mg Caps (Doxycycline hyclate) .Marland Kitchen.. 1po two times a day  Patient Instructions: 1)  Please take all new medications as prescribed 2)  Continue all previous medications as before this visit  3)  You can also use Mucinex OTC or it's generic for congestion , and Delsym OTC for cough 4)  Please be sure to f/u with your endocrinologist about the cholesterol 5)  Please schedule a follow-up appointment as needed. Prescriptions: DOXYCYCLINE HYCLATE 100 MG CAPS (DOXYCYCLINE HYCLATE) 1po two times a day  #20 x 0   Entered and Authorized by:   Corwin Levins MD   Signed by:   Corwin Levins MD on 08/30/2010   Method used:   Print then Give to Patient   RxID:   [id number] TESSALON PERLES 100 MG CAPS (BENZONATATE) 1-2 by mouth three times a day as needed cough  #60 x 1   Entered and Authorized by:   Corwin Levins MD   Signed by:   Corwin Levins MD on 08/30/2010   Method used:   Print then Give to Patient   RxID:   [id number]    Orders Added: 1)  Est. Patient Level IV [84696]

## 2010-09-07 NOTE — Progress Notes (Signed)
Summary: Seroquel  Phone Note Call from Patient Call back at Home Phone [phone]   Summary of Call: Patient left message on triage that she has a coupon for Seroquel. In order to use the coupon the patient needs an prescription sent to her pharmacy for a qty of 14. Please fax prescription to patient pharmacy.   Please advise. Initial call taken by: Lucious Groves,  October 07, 2009 3:17 PM  Follow-up for Phone Call        ok - to robin to handle    Prescriptions: SEROQUEL 100 MG  TABS (QUETIAPINE FUMARATE) 1 by mouth at bedtime  #14 x 0   Entered by:   Scharlene Gloss   Authorized by:   Corwin Levins MD   Signed by:   Scharlene Gloss on 10/07/2009   Method used:   F[facility]ed to ...       [facility]       [address]       [place], [place]  [address]       Ph: [phone]       Fax: [phone]   RxID:   [id number]

## 2010-09-07 NOTE — Progress Notes (Signed)
  Phone Note Other Incoming   Request: Send information Summary of Call: Request for records received from [facility]. Request forwarded to [facility].

## 2010-09-07 NOTE — Assessment & Plan Note (Signed)
Summary: FU PER TRIAGE/NWS   Vital Signs:  Patient profile:   70 year old female Height:      67.5 inches Weight:      171 pounds BMI:     26.48 O2 Sat:      98 % on Room air Temp:     98.3 degrees F oral Pulse rate:   85 / minute BP sitting:   122 / 62  (left arm) Cuff size:   regular  Vitals Entered ByZella Ball Ewing (January 24, 2010 10:21 AM)  O2 Flow:  Room air  CC: Followup memory problems/RE   Primary Care Provider:  Oliver Barre MD  CC:  Followup memory problems/RE.  History of Present Illness: vision near back to baseline after treated for right eye infection - originally seen in the ER  and then later per optho - Dr Vonna Kotyk;  she c/o incresased memory problem short term with forgetting where she put things , and getting off track and forgetting what she is doing or meant to do;  often has to read things twice to understand ;  does understand her medications and does well with them admin;  lives alone and does all her ADL's;  2 girlfriends have noted some increased forgetfulness; no pain, headache, gait change or falls';  no wt loss, fever, St, cough or n/v/d, rash, joint pain .  Pt denies CP, sob, doe, wheezing, orthopnea, pnd, worsening LE edema, palps, dizziness or syncope  Pt denies other new neuro symptoms such as headache, facial or extremity weakness Pt denies polydipsia, polyuria, or low sugar symptoms such as shakiness improved with eating.  Overall good compliance with meds, trying to follow low chol, DM diet, wt stable, little excercise however   Preventive Screening-Counseling & Management      Drug Use:  no.    Problems Prior to Update: 1)  Memory Loss  (ICD-780.93) 2)  Back Pain  (ICD-724.5) 3)  Abdominal Pain, Lower  (ICD-789.09) 4)  Colonic Polyps, Hx of  (ICD-V12.72) 5)  Abdominal Pain -generalized  (ICD-789.07) 6)  Personal Hx Colonic Polyps  (ICD-V12.72) 7)  Preventive Health Care  (ICD-V70.0) 8)  Osteopenia  (ICD-733.90) 9)  Health Screening   (ICD-V70.0) 10)  Inguinal Pain, Right  (ICD-789.09) 11)  Menopausal Disorder  (ICD-627.9) 12)  Ear Pain  (ICD-388.70) 13)  Asthmatic Bronchitis, Acute  (ICD-466.0) 14)  Shoulder Pain, Left, Chronic  (ICD-719.41) 15)  Osteoarthritis, Knees, Bilateral  (ICD-715.96) 16)  Cervical Radiculopathy, Left  (ICD-723.4) 17)  Osteoarthritis, Knee, Right  (ICD-715.96) 18)  Gastroenteritis  (ICD-558.9) 19)  Fatigue  (ICD-780.79) 20)  Cigarette Smoker  (ICD-305.1) 21)  Abnormality of Gait  (ICD-781.2) 22)  Fever Unspecified  (ICD-780.60) 23)  Family History of Cad Female 1st Degree Relative <60  (ICD-V16.49) 24)  Restless Leg Syndrome  (ICD-333.94) 25)  Peptic Ulcer Disease  (ICD-533.90) 26)  Gout  (ICD-274.9) 27)  Anemia-nos  (ICD-285.9) 28)  Common Migraine  (ICD-346.10) 29)  Peripheral Neuropathy  (ICD-356.9) 30)  COPD  (ICD-496) 31)  Peripheral Vascular Disease  (ICD-443.9) 32)  Hypothyroidism  (ICD-244.9) 33)  Seizure Disorder  (ICD-780.39) 34)  Renal Insufficiency  (ICD-588.9) 35)  Hypertension  (ICD-401.9) 36)  Hyperlipidemia  (ICD-272.4) 37)  Gerd  (ICD-530.81) 38)  Diabetes Mellitus, Type II  (ICD-250.00) 39)  Depression  (ICD-311) 40)  Allergic Rhinitis  (ICD-477.9)  Medications Prior to Update: 1)  Glimepiride 1 Mg  Tabs (Glimepiride) .... Take 1 By Mouth Qd 2)  Seroquel 100  Mg  Tabs (Quetiapine Fumarate) .Marland Kitchen.. 1 By Mouth At Bedtime 3)  Levothyroxine Sodium 112 Mcg Tabs (Levothyroxine Sodium) .Marland Kitchen.. 1 By Mouth Once Daily 4)  Allopurinol 100 Mg Tabs (Allopurinol) .Marland Kitchen.. 1 By Mouth Once Daily 5)  Furosemide 80 Mg  Tabs (Furosemide) .... 3 By Mouth Once Daily 6)  Proair Hfa 108 (90 Base) Mcg/act Aers (Albuterol Sulfate) .... 2 Puffs Up To Four Times Per Day As Needed 7)  Cymbalta 60 Mg Cpep (Duloxetine Hcl) .Marland Kitchen.. 1 By Mouth Once Daily 8)  Januvia 100 Mg  Tabs (Sitagliptin Phosphate) .... 1/4 By Mouth Once Daily 9)  Tizanidine Hcl 4 Mg Tabs (Tizanidine Hcl) .Marland Kitchen.. 1 By Mouth Two Times A Day  As Needed 10)  Clonazepam 2 Mg Tabs (Clonazepam) .Marland Kitchen.. 1po Two Times A Day 11)  Spiriva Handihaler 18 Mcg Caps (Tiotropium Bromide Monohydrate) .Marland Kitchen.. 1 Puff Once Daily 12)  Lipitor 40 Mg Tabs (Atorvastatin Calcium) .Marland Kitchen.. 1po Once Daily 13)  Protonix 40 Mg Tbec (Pantoprazole Sodium) .Marland Kitchen.. 1po Once Daily 14)  Promethazine Hcl 25 Mg Tabs (Promethazine Hcl) .Marland Kitchen.. 1 By Mouth Every 6 Hours As Needed 15)  Wellbutrin Xl 300 Mg Xr24h-Tab (Bupropion Hcl) .Marland Kitchen.. 1po Once Daily 16)  Tramadol Hcl 50 Mg Tabs (Tramadol Hcl) .Marland Kitchen.. 1po Two Times A Day  - Per Pain Md 17)  Lyrica 50 Mg Caps (Pregabalin) .... 2 By Mouth Three Times A Day 18)  Vicodin 5-500 Mg Tabs (Hydrocodone-Acetaminophen) .... Take 1 Tab Every 6  Hours As Needed For Pain 19)  Trazodone Hcl 100 Mg Tabs (Trazodone Hcl) .Marland Kitchen.. 1po At Bedtime As Needed  Current Medications (verified): 1)  Glimepiride 1 Mg  Tabs (Glimepiride) .... Take 1 By Mouth Qd 2)  Seroquel 100 Mg  Tabs (Quetiapine Fumarate) .Marland Kitchen.. 1 By Mouth At Bedtime 3)  Levothyroxine Sodium 112 Mcg Tabs (Levothyroxine Sodium) .Marland Kitchen.. 1 By Mouth Once Daily 4)  Allopurinol 100 Mg Tabs (Allopurinol) .Marland Kitchen.. 1 By Mouth Once Daily 5)  Furosemide 80 Mg  Tabs (Furosemide) .... 3 By Mouth Once Daily 6)  Proair Hfa 108 (90 Base) Mcg/act Aers (Albuterol Sulfate) .... 2 Puffs Up To Four Times Per Day As Needed 7)  Cymbalta 60 Mg Cpep (Duloxetine Hcl) .Marland Kitchen.. 1 By Mouth Once Daily 8)  Januvia 100 Mg  Tabs (Sitagliptin Phosphate) .... 1/4 By Mouth Once Daily 9)  Tizanidine Hcl 4 Mg Tabs (Tizanidine Hcl) .Marland Kitchen.. 1 By Mouth Two Times A Day As Needed 10)  Clonazepam 2 Mg Tabs (Clonazepam) .Marland Kitchen.. 1po Two Times A Day 11)  Spiriva Handihaler 18 Mcg Caps (Tiotropium Bromide Monohydrate) .Marland Kitchen.. 1 Puff Once Daily 12)  Lipitor 40 Mg Tabs (Atorvastatin Calcium) .Marland Kitchen.. 1po Once Daily 13)  Protonix 40 Mg Tbec (Pantoprazole Sodium) .Marland Kitchen.. 1po Once Daily 14)  Promethazine Hcl 25 Mg Tabs (Promethazine Hcl) .Marland Kitchen.. 1 By Mouth Every 6 Hours As  Needed 15)  Wellbutrin Xl 300 Mg Xr24h-Tab (Bupropion Hcl) .Marland Kitchen.. 1po Once Daily 16)  Tramadol Hcl 50 Mg Tabs (Tramadol Hcl) .Marland Kitchen.. 1po Two Times A Day  - Per Pain Md 17)  Lyrica 50 Mg Caps (Pregabalin) .... 2 By Mouth Three Times A Day 18)  Vicodin 5-500 Mg Tabs (Hydrocodone-Acetaminophen) .... Take 1 Tab Every 6  Hours As Needed For Pain 19)  Trazodone Hcl 100 Mg Tabs (Trazodone Hcl) .Marland Kitchen.. 1po At Bedtime As Needed  Allergies (verified): 1)  ! * Actos 2)  Nsaids  Past History:  Past Surgical History: Last updated: 09/22/2009 Left toe amputated (2006) Bunionectomy (  1980) Goiter Removal (1997) Stress Cardiolite (06/18/2006) Tranthoracic Echocardiogram (06/18/2006) EKG (05/29/2006) Cholecystectomy Rotator cuff repair - left  - dr Lajoyce Corners  Social History: Last updated: 01/24/2010 Alcohol use-no Single - marriage annulled Current Smoker no children disabled - c-spine and back pain Drug use-no  Risk Factors: Smoking Status: current (09/17/2007)  Past Medical History: Allergic rhinitis Depression Diabetes mellitus, type II - Dr Meriel Flavors GERD Hyperlipidemia Hypertension Renal insufficiency - Dr Powell/trenal Seizure disorder Hypothyroidism Peripheral vascular disease Chronic Back Pain/lumbar disc disease/spinal stenosis - Dr Waldron Labs Heart Murmur COPD Peripheral Neuropathy with pain migraine Anemia-NOS Gout Peptic ulcer disease RLS Osteopenia COLON POLYPS-ADENOMATOUS  Social History: Reviewed history from 12/02/2007 and no changes required. Alcohol use-no Single - marriage annulled Current Smoker no children disabled - c-spine and back pain Drug use-no Drug Use:  no  Review of Systems       all otherwise negative per pt -    Physical Exam  General:  alert and overweight-appearing.   Head:  normocephalic and atraumatic.   Eyes:  vision grossly intact, pupils equal, and pupils round.   Ears:  R ear normal and L ear normal.   Nose:  no external  deformity and no nasal discharge.   Mouth:  no gingival abnormalities and pharynx pink and moist.   Neck:  supple and no masses.   Lungs:  normal respiratory effort, R decreased breath sounds, and L decreased breath sounds.   Heart:  normal rate and regular rhythm.   Msk:  no joint tenderness and no joint swelling.   Extremities:  no edema, no erythema  Neurologic:  alert & oriented X3, cranial nerves II-XII intact, and gait normal.   Psych:  not anxious appearing and moderately anxious.     Impression & Recommendations:  Problem # 1:  MEMORY LOSS (ICD-780.93)  exam with forgetfulness, suspect related to psychiatric stress levels;will check MRI to r/o other;    Orders: Radiology Referral (Radiology) TLB-B12 + Folate Pnl ([id number]) TLB-Sedimentation Rate (ESR) (85652-ESR)  Problem # 2:  DEPRESSION (ICD-311)  Her updated medication list for this problem includes:    Cymbalta 60 Mg Cpep (Duloxetine hcl) .Marland Kitchen... 1 by mouth once daily    Clonazepam 2 Mg Tabs (Clonazepam) .Marland Kitchen... 1po two times a day    Wellbutrin Xl 300 Mg Xr24h-tab (Bupropion hcl) .Marland Kitchen... 1po once daily    Trazodone Hcl 100 Mg Tabs (Trazodone hcl) .Marland Kitchen... 1po at bedtime as needed with anxiety; no longer sees [facility] - discharged from there and they are only local provider that sees medicare;  Continue all previous medications as before this visit , stable overall by hx and exam, ok to continue meds/tx as is   Problem # 3:  HYPERTENSION (ICD-401.9)  Her updated medication list for this problem includes:    Furosemide 80 Mg Tabs (Furosemide) .Marland KitchenMarland KitchenMarland KitchenMarland Kitchen 3 by mouth once daily  BP today: 122/62 Prior BP: 112/70 (12/12/2009)  Labs Reviewed: K+: 4.1 (11/16/2009) Creat: : 2.7 (11/16/2009)   Chol: 128 (06/03/2009)   HDL: 57.90 (06/03/2009)   LDL: 52 (06/03/2009)   TG: 93.0 (06/03/2009) stable overall by hx and exam, ok to continue meds/tx as is   Problem # 4:  HYPERLIPIDEMIA (ICD-272.4)  Her updated medication  list for this problem includes:    Lipitor 40 Mg Tabs (Atorvastatin calcium) .Marland Kitchen... 1po once daily  Labs Reviewed: SGOT: 26 (09/17/2007)   SGPT: 25 (09/17/2007)   HDL:57.90 (06/03/2009), 54.4 (07/23/2008)  LDL:52 (06/03/2009), DEL (14/78/2956)  Chol:128 (06/03/2009), 263 (07/23/2008)  Trig:93.0 (06/03/2009), 277 (07/23/2008) stable overall by hx and exam, ok to continue meds/tx as is . Pt to continue diet efforts, good med tolerance; to check labs - goal LDL less than 70   Problem # 5:  DIABETES MELLITUS, TYPE II (ICD-250.00)  Her updated medication list for this problem includes:    Glimepiride 1 Mg Tabs (Glimepiride) .Marland Kitchen... Take 1 by mouth qd    Januvia 100 Mg Tabs (Sitagliptin phosphate) .Marland Kitchen... 1/4 by mouth once daily  Orders: TLB-BMP (Basic Metabolic Panel-BMET) (80048-METABOL) TLB-A1C / Hgb A1C (Glycohemoglobin) (83036-A1C) TLB-Lipid Panel (80061-LIPID) also sees endo ; for labs today to assess renal fxn as well ; Continue all previous medications as before this visit , Pt to cont DM diet, excercise, wt loss efforts; to check labs today   Problem # 6:  RENAL INSUFFICIENCY (ICD-588.9) as above; has f/u with renal in several months  Complete Medication List: 1)  Glimepiride 1 Mg Tabs (Glimepiride) .... Take 1 by mouth qd 2)  Seroquel 100 Mg Tabs (Quetiapine fumarate) .Marland Kitchen.. 1 by mouth at bedtime 3)  Levothyroxine Sodium 112 Mcg Tabs (Levothyroxine sodium) .Marland Kitchen.. 1 by mouth once daily 4)  Allopurinol 100 Mg Tabs (Allopurinol) .Marland Kitchen.. 1 by mouth once daily 5)  Furosemide 80 Mg Tabs (Furosemide) .... 3 by mouth once daily 6)  Proair Hfa 108 (90 Base) Mcg/act Aers (Albuterol sulfate) .... 2 puffs up to four times per day as needed 7)  Cymbalta 60 Mg Cpep (Duloxetine hcl) .Marland Kitchen.. 1 by mouth once daily 8)  Januvia 100 Mg Tabs (Sitagliptin phosphate) .... 1/4 by mouth once daily 9)  Tizanidine Hcl 4 Mg Tabs (Tizanidine hcl) .Marland Kitchen.. 1 by mouth two times a day as needed 10)  Clonazepam 2 Mg Tabs  (Clonazepam) .Marland Kitchen.. 1po two times a day 11)  Spiriva Handihaler 18 Mcg Caps (Tiotropium bromide monohydrate) .Marland Kitchen.. 1 puff once daily 12)  Lipitor 40 Mg Tabs (Atorvastatin calcium) .Marland Kitchen.. 1po once daily 13)  Protonix 40 Mg Tbec (Pantoprazole sodium) .Marland Kitchen.. 1po once daily 14)  Promethazine Hcl 25 Mg Tabs (Promethazine hcl) .Marland Kitchen.. 1 by mouth every 6 hours as needed 15)  Wellbutrin Xl 300 Mg Xr24h-tab (Bupropion hcl) .Marland Kitchen.. 1po once daily 16)  Tramadol Hcl 50 Mg Tabs (Tramadol hcl) .Marland Kitchen.. 1po two times a day  - per pain md 17)  Lyrica 50 Mg Caps (Pregabalin) .... 2 by mouth three times a day 18)  Vicodin 5-500 Mg Tabs (Hydrocodone-acetaminophen) .... Take 1 tab every 6  hours as needed for pain 19)  Trazodone Hcl 100 Mg Tabs (Trazodone hcl) .Marland Kitchen.. 1po at bedtime as needed  Other Orders: TLB-TSH (Thyroid Stimulating Hormone) [phone])  Patient Instructions: 1)  You will be contacted about the referral(s) to: MRI 2)  Continue all previous medications as before this visit  3)  please keep your appts wtih your specialists as you do 4)  Please go to the Lab in the basement for your blood and/or urine tests today 5)  Please schedule a follow-up appointment in feb 2012 for your "yearly medicare exam"

## 2010-09-07 NOTE — Progress Notes (Signed)
Summary: Med cost  Phone Note Call from Patient Call back at Home Phone [phone]   Caller: Patient Summary of Call: pt called stating that Rx for Omeprazole was still too expensive at $30.00. [facility] has Ranitidine on $4 list. okay to change? Initial call taken by: Margaret Pyle, CMA,  August 23, 2009 10:43 AM  Follow-up for Phone Call        ok to changei   I will do Follow-up by: Corwin Levins MD,  August 23, 2009 12:00 PM    New/Updated Medications: RANITIDINE HCL 300 MG CAPS (RANITIDINE HCL) 1po once daily Prescriptions: RANITIDINE HCL 300 MG CAPS (RANITIDINE HCL) 1po once daily  #90 x 3   Entered and Authorized by:   Corwin Levins MD   Signed by:   Corwin Levins MD on 08/23/2009   Method used:   Electronically to        [facility] (retail)       [address]       [place], [place]  [address]       Ph: [phone]       Fax: [phone]   RxID:   [id number]   Appended Document: Med cost pt informed
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
## 2010-09-07 NOTE — Progress Notes (Signed)
Summary: appt  Phone Note Call from Patient Call back at Home Phone [phone]   Caller: Patient Summary of Call: Pt left vm need to make appt with md to discuss her meds. Called pt made appt for 05/01/10 per pt request Initial call taken by: Orlan Leavens RMA,  April 20, 2010 3:41 PM

## 2010-09-07 NOTE — Progress Notes (Signed)
Summary: Psychiatry  Phone Note Call from Patient Call back at Home Phone [phone]   Caller: Patient Summary of Call: pt called stating that she cannot be seen by any MD at [facility] because she was D/C'd from entire practice. Last MD pt was referred to was in that practice. pt is requesting a new referral. please advise Initial call taken by: Margaret Pyle, CMA,  October 06, 2009 2:40 PM  Follow-up for Phone Call        already referred, so I will forward to [facility] to handle Follow-up by: Corwin Levins MD,  October 06, 2009 2:48 PM  Additional Follow-up for Phone Call Additional follow up Details #1::        Called pt and gave the phone number to Dr Jennelle Human [phone]. Pt has to self schedule appt . Additional Follow-up by: Dagoberto Reef,  October 06, 2009 3:28 PM

## 2010-09-07 NOTE — Progress Notes (Signed)
Summary: med update  Phone Note Call from Patient Call back at [facility] Phone [phone]   Summary of Call: pt called to update medications with MD Initial call taken by: Margaret Pyle, CMA,  August 30, 2009 2:31 PM    New/Updated Medications: WELLBUTRIN SR 150 MG XR12H-TAB (BUPROPION HCL) 1 by mouth once daily

## 2010-09-07 NOTE — Assessment & Plan Note (Signed)
Summary: Discuss meds/LMB   Vital Signs:  Patient profile:   70 year old female Height:      67.5 inches Weight:      180 pounds BMI:     27.88 O2 Sat:      99 % on Room air Temp:     98.4 degrees F oral Pulse rate:   78 / minute BP sitting:   122 / 68  (left arm) Cuff size:   regular  Vitals Entered By: Zella Ball Ewing CMA Duncan Dull) (May 01, 2010 1:18 PM)  O2 Flow:  Room air CC: Discuss medications/RE   Primary Care Zamani Crocker:  Oliver Barre MD  CC:  Discuss medications/RE.  History of Present Illness: here to f/u; she was referred to private psychiatry but could not afford further, so she called medicare to see which psychiatrists in the are take medicare - was referred to [facility], aw counselor who noted her polypharmacy but no mediation changes;  no f/u appt made;  mentions she simply  cannot afford the seroquel and the wellbutrin;  uses the seroquel samples when she has them inplace of the trazodone when has samples;  Pt denies CP, worsening sob, doe, wheezing, orthopnea, pnd, worsening LE edema, palps, dizziness or syncope  Pt denies new neuro symptoms such as headache, facial or extremity weakness Pt denies polydipsia, polyuria, or low sugar symptoms such as shakiness improved with eating.  Overall good compliance with meds, trying to follow low chol, DM diet, wt stable, little excercise however No worsening depressive symptoms but has marked anxiety ongoing on current meds.   Problems Prior to Update: 1)  Anxiety  (ICD-300.00) 2)  Memory Loss  (ICD-780.93) 3)  Back Pain  (ICD-724.5) 4)  Abdominal Pain, Lower  (ICD-789.09) 5)  Colonic Polyps, Hx of  (ICD-V12.72) 6)  Abdominal Pain -generalized  (ICD-789.07) 7)  Personal Hx Colonic Polyps  (ICD-V12.72) 8)  Preventive Health Care  (ICD-V70.0) 9)  Osteopenia  (ICD-733.90) 10)  Health Screening  (ICD-V70.0) 11)  Inguinal Pain, Right  (ICD-789.09) 12)  Menopausal Disorder  (ICD-627.9) 13)  Ear Pain  (ICD-388.70) 14)   Asthmatic Bronchitis, Acute  (ICD-466.0) 15)  Shoulder Pain, Left, Chronic  (ICD-719.41) 16)  Osteoarthritis, Knees, Bilateral  (ICD-715.96) 17)  Cervical Radiculopathy, Left  (ICD-723.4) 18)  Osteoarthritis, Knee, Right  (ICD-715.96) 19)  Gastroenteritis  (ICD-558.9) 20)  Fatigue  (ICD-780.79) 21)  Cigarette Smoker  (ICD-305.1) 22)  Abnormality of Gait  (ICD-781.2) 23)  Fever Unspecified  (ICD-780.60) 24)  Family History of Cad Female 1st Degree Relative <60  (ICD-V16.49) 25)  Restless Leg Syndrome  (ICD-333.94) 26)  Peptic Ulcer Disease  (ICD-533.90) 27)  Gout  (ICD-274.9) 28)  Anemia-nos  (ICD-285.9) 29)  Common Migraine  (ICD-346.10) 30)  Peripheral Neuropathy  (ICD-356.9) 31)  COPD  (ICD-496) 32)  Peripheral Vascular Disease  (ICD-443.9) 33)  Hypothyroidism  (ICD-244.9) 34)  Seizure Disorder  (ICD-780.39) 35)  Renal Insufficiency  (ICD-588.9) 36)  Hypertension  (ICD-401.9) 37)  Hyperlipidemia  (ICD-272.4) 38)  Gerd  (ICD-530.81) 39)  Diabetes Mellitus, Type II  (ICD-250.00) 40)  Depression  (ICD-311) 41)  Allergic Rhinitis  (ICD-477.9)  Medications Prior to Update: 1)  Glimepiride 1 Mg  Tabs (Glimepiride) .... Take 1 By Mouth Qd 2)  Seroquel 100 Mg  Tabs (Quetiapine Fumarate) .Marland Kitchen.. 1 By Mouth At Bedtime 3)  Levothyroxine Sodium 112 Mcg Tabs (Levothyroxine Sodium) .Marland Kitchen.. 1 By Mouth Once Daily 4)  Allopurinol 100 Mg Tabs (Allopurinol) .Marland Kitchen.. 1 By  Mouth Once Daily 5)  Furosemide 80 Mg  Tabs (Furosemide) .... 3 By Mouth Once Daily 6)  Proair Hfa 108 (90 Base) Mcg/act Aers (Albuterol Sulfate) .... 2 Puffs Up To Four Times Per Day As Needed 7)  Cymbalta 60 Mg Cpep (Duloxetine Hcl) .Marland Kitchen.. 1 By Mouth Once Daily 8)  Januvia 100 Mg  Tabs (Sitagliptin Phosphate) .... 1/4 By Mouth Once Daily 9)  Flexeril 5 Mg Tabs (Cyclobenzaprine Hcl) .Marland Kitchen.. 1 By Mouth Three Times A Day As Needed 10)  Clonazepam 2 Mg Tabs (Clonazepam) .Marland Kitchen.. 1po Two Times A Day 11)  Spiriva Handihaler 18 Mcg Caps (Tiotropium  Bromide Monohydrate) .Marland Kitchen.. 1 Puff Once Daily 12)  Lipitor 40 Mg Tabs (Atorvastatin Calcium) .Marland Kitchen.. 1po Once Daily 13)  Protonix 40 Mg Tbec (Pantoprazole Sodium) .Marland Kitchen.. 1po Once Daily 14)  Promethazine Hcl 25 Mg Tabs (Promethazine Hcl) .Marland Kitchen.. 1 By Mouth Every 6 Hours As Needed 15)  Wellbutrin Xl 300 Mg Xr24h-Tab (Bupropion Hcl) .Marland Kitchen.. 1po Once Daily 16)  Lyrica 50 Mg Caps (Pregabalin) .... 2 By Mouth Three Times A Day 17)  Hydrocodone-Acetaminophen 7.5-325 Mg Tabs (Hydrocodone-Acetaminophen) .Marland Kitchen.. 1po Four Times Per Day As Needed Pain 18)  Trazodone Hcl 100 Mg Tabs (Trazodone Hcl) .Marland Kitchen.. 1po At Bedtime As Needed 19)  Colcrys 0.6 Mg Tabs (Colchicine) .Marland Kitchen.. 1po Once Daily 20)  Hydroxyzine Hcl 25 Mg Tabs (Hydroxyzine Hcl) .Marland Kitchen.. 1  Op Q 6 Hrs As Needed Nausea or Nerves  Current Medications (verified): 1)  Glimepiride 1 Mg  Tabs (Glimepiride) .... Take 1 By Mouth Qd 2)  Levothyroxine Sodium 112 Mcg Tabs (Levothyroxine Sodium) .Marland Kitchen.. 1 By Mouth Once Daily 3)  Allopurinol 100 Mg Tabs (Allopurinol) .Marland Kitchen.. 1 By Mouth Once Daily 4)  Furosemide 80 Mg  Tabs (Furosemide) .... 3 By Mouth Once Daily 5)  Proair Hfa 108 (90 Base) Mcg/act Aers (Albuterol Sulfate) .... 2 Puffs Up To Four Times Per Day As Needed 6)  Cymbalta 60 Mg Cpep (Duloxetine Hcl) .Marland Kitchen.. 1 By Mouth Once Daily 7)  Januvia 100 Mg  Tabs (Sitagliptin Phosphate) .... 1/4 By Mouth Once Daily 8)  Flexeril 5 Mg Tabs (Cyclobenzaprine Hcl) .Marland Kitchen.. 1 By Mouth Three Times A Day As Needed 9)  Clonazepam 2 Mg Tabs (Clonazepam) .Marland Kitchen.. 1po Two Times A Day 10)  Spiriva Handihaler 18 Mcg Caps (Tiotropium Bromide Monohydrate) .Marland Kitchen.. 1 Puff Once Daily 11)  Lipitor 40 Mg Tabs (Atorvastatin Calcium) .Marland Kitchen.. 1po Once Daily 12)  Protonix 40 Mg Tbec (Pantoprazole Sodium) .Marland Kitchen.. 1po Once Daily 13)  Promethazine Hcl 25 Mg Tabs (Promethazine Hcl) .Marland Kitchen.. 1 By Mouth Every 6 Hours As Needed 14)  Lyrica 50 Mg Caps (Pregabalin) .... 2 By Mouth Three Times A Day 15)  Hydrocodone-Acetaminophen 7.5-325 Mg  Tabs (Hydrocodone-Acetaminophen) .Marland Kitchen.. 1po Four Times Per Day As Needed Pain 16)  Trazodone Hcl 100 Mg Tabs (Trazodone Hcl) .Marland Kitchen.. 1po At Bedtime As Needed (When Does Not Have Seroquel) 17)  Colcrys 0.6 Mg Tabs (Colchicine) .Marland Kitchen.. 1po Once Daily 18)  Hydroxyzine Hcl 25 Mg Tabs (Hydroxyzine Hcl) .Marland Kitchen.. 1  Op Q 6 Hrs As Needed Nausea or Nerves 19)  Seroquel 100 Mg Tabs (Quetiapine Fumarate) .Marland Kitchen.. 1po At Bedtime in Place of The Trazodone  Allergies (verified): 1)  ! * Actos 2)  Nsaids  Past History:  Past Medical History: Last updated: 03/23/2010 Allergic rhinitis Depression Diabetes mellitus, type II - Dr Meriel Flavors GERD Hyperlipidemia Hypertension Renal insufficiency - Dr Powell/trenal Seizure disorder Hypothyroidism Peripheral vascular disease Chronic Back Pain/lumbar disc disease/spinal stenosis -  Dr Waldron Labs Heart Murmur COPD Peripheral Neuropathy with pain migraine Anemia-NOS Gout Peptic ulcer disease RLS Osteopenia COLON POLYPS-ADENOMATOUS Anxiety  Past Surgical History: Last updated: 09/22/2009 Left toe amputated (2006) Bunionectomy (1980) Goiter Removal (1997) Stress Cardiolite (06/18/2006) Tranthoracic Echocardiogram (06/18/2006) EKG (05/29/2006) Cholecystectomy Rotator cuff repair - left  - dr Lajoyce Corners  Social History: Last updated: 01/24/2010 Alcohol use-no Single - marriage annulled Current Smoker no children disabled - c-spine and back pain Drug use-no  Risk Factors: Smoking Status: current (09/17/2007)  Review of Systems       all otherwise negative per pt -    Physical Exam  General:  alert and overweight-appearing.   Head:  normocephalic and atraumatic.   Eyes:  vision grossly intact, pupils equal, and pupils round.   Ears:  R ear normal and L ear normal.   Nose:  no external deformity and no nasal discharge.   Mouth:  no gingival abnormalities and pharynx pink and moist.   Neck:  supple and no masses.   Lungs:  normal respiratory effort,  R decreased breath sounds, and L decreased breath sounds.   Heart:  normal rate and regular rhythm.   Abdomen:  soft, non-tender, and normal bowel sounds.   Extremities:  no edema, no erythema  Psych:  depressed affect, slightly anxious, and easily distracted.     Impression & Recommendations:  Problem # 1:  ANXIETY (ICD-300.00)  The following medications were removed from the medication list:    Wellbutrin Xl 300 Mg Xr24h-tab (Bupropion hcl) .Marland Kitchen... 1po once daily Her updated medication list for this problem includes:    Cymbalta 60 Mg Cpep (Duloxetine hcl) .Marland Kitchen... 1 by mouth once daily    Clonazepam 2 Mg Tabs (Clonazepam) .Marland Kitchen... 1po two times a day    Trazodone Hcl 100 Mg Tabs (Trazodone hcl) .Marland Kitchen... 1po at bedtime as needed (when does not have seroquel)    Hydroxyzine Hcl 25 Mg Tabs (Hydroxyzine hcl) .Marland Kitchen... 1  op q 6 hrs as needed nausea or nerves stable overall by hx and exam, ok to continue meds/tx as is   Problem # 2:  HYPERTENSION (ICD-401.9)  Her updated medication list for this problem includes:    Furosemide 80 Mg Tabs (Furosemide) .Marland KitchenMarland KitchenMarland KitchenMarland Kitchen 3 by mouth once daily  BP today: 122/68 Prior BP: 122/64 (03/23/2010)  Labs Reviewed: K+: 4.2 (01/24/2010) Creat: : 3.5 (01/24/2010)   Chol: 176 (01/24/2010)   HDL: 55.00 (01/24/2010)   LDL: 52 (06/03/2009)   TG: 208.0 (01/24/2010) stable overall by hx and exam, ok to continue meds/tx as is   Problem # 3:  HYPERLIPIDEMIA (ICD-272.4)  Her updated medication list for this problem includes:    Lipitor 40 Mg Tabs (Atorvastatin calcium) .Marland Kitchen... 1po once daily  Labs Reviewed: SGOT: 26 (09/17/2007)   SGPT: 25 (09/17/2007)   HDL:55.00 (01/24/2010), 57.90 (06/03/2009)  LDL:52 (06/03/2009), DEL (16/05/9603)  Chol:176 (01/24/2010), 128 (06/03/2009)  Trig:208.0 (01/24/2010), 93.0 (06/03/2009) stable overall by hx and exam, ok to continue meds/tx as is   Problem # 4:  DIABETES MELLITUS, TYPE II (ICD-250.00)  Her updated medication list for this  problem includes:    Glimepiride 1 Mg Tabs (Glimepiride) .Marland Kitchen... Take 1 by mouth qd    Januvia 100 Mg Tabs (Sitagliptin phosphate) .Marland Kitchen... 1/4 by mouth once daily  Labs Reviewed: Creat: 3.5 (01/24/2010)    Reviewed HgBA1c results: 6.4 (01/24/2010)  6.0 (06/03/2009) stable overall by hx and exam, ok to continue meds/tx as is   Complete Medication List: 1)  Glimepiride 1  Mg Tabs (Glimepiride) .... Take 1 by mouth qd 2)  Levothyroxine Sodium 112 Mcg Tabs (Levothyroxine sodium) .Marland Kitchen.. 1 by mouth once daily 3)  Allopurinol 100 Mg Tabs (Allopurinol) .Marland Kitchen.. 1 by mouth once daily 4)  Furosemide 80 Mg Tabs (Furosemide) .... 3 by mouth once daily 5)  Proair Hfa 108 (90 Base) Mcg/act Aers (Albuterol sulfate) .... 2 puffs up to four times per day as needed 6)  Cymbalta 60 Mg Cpep (Duloxetine hcl) .Marland Kitchen.. 1 by mouth once daily 7)  Januvia 100 Mg Tabs (Sitagliptin phosphate) .... 1/4 by mouth once daily 8)  Flexeril 5 Mg Tabs (Cyclobenzaprine hcl) .Marland Kitchen.. 1 by mouth three times a day as needed 9)  Clonazepam 2 Mg Tabs (Clonazepam) .Marland Kitchen.. 1po two times a day 10)  Spiriva Handihaler 18 Mcg Caps (Tiotropium bromide monohydrate) .Marland Kitchen.. 1 puff once daily 11)  Lipitor 40 Mg Tabs (Atorvastatin calcium) .Marland Kitchen.. 1po once daily 12)  Protonix 40 Mg Tbec (Pantoprazole sodium) .Marland Kitchen.. 1po once daily 13)  Promethazine Hcl 25 Mg Tabs (Promethazine hcl) .Marland Kitchen.. 1 by mouth every 6 hours as needed 14)  Lyrica 50 Mg Caps (Pregabalin) .... 2 by mouth three times a day 15)  Hydrocodone-acetaminophen 7.5-325 Mg Tabs (Hydrocodone-acetaminophen) .Marland Kitchen.. 1po four times per day as needed pain 16)  Trazodone Hcl 100 Mg Tabs (Trazodone hcl) .Marland Kitchen.. 1po at bedtime as needed (when does not have seroquel) 17)  Colcrys 0.6 Mg Tabs (Colchicine) .Marland Kitchen.. 1po once daily 18)  Hydroxyzine Hcl 25 Mg Tabs (Hydroxyzine hcl) .Marland Kitchen.. 1  op q 6 hrs as needed nausea or nerves 19)  Seroquel 100 Mg Tabs (Quetiapine fumarate) .Marland Kitchen.. 1po at bedtime in place of the trazodone  Patient  Instructions: 1)  Continue all previous medications as before this visit 2)  Please return to [facility] for further psychiatric symptoms, or [facility] 3)  Please schedule a follow-up appointment in 6 months for your "yearly medicare exam" Prescriptions: COLCRYS 0.6 MG TABS (COLCHICINE) 1po once daily  #30 x 11   Entered and Authorized by:   Corwin Levins MD   Signed by:   Corwin Levins MD on 05/01/2010   Method used:   Print then Give to Patient   RxID:   [id number]

## 2010-09-07 NOTE — Assessment & Plan Note (Signed)
Summary: F/U APPT / CD   Vital Signs:  Patient profile:   70 year old female Height:      67.5 inches Weight:      174 pounds BMI:     26.95 O2 Sat:      97 % on Room air Temp:     98.7 degrees F oral Pulse rate:   75 / minute BP sitting:   136 / 80  (left arm) Cuff size:   regular  Vitals Entered ByZella Ball Ewing (September 22, 2009 2:04 PM)  O2 Flow:  Room air  CC: followup/RE   CC:  followup/RE.  History of Present Illness: unfort Dr Reddy/psych will no longer see her since she had  asked for xanax from myself and him (I think most likely a sincere mistake although I cannot be sure);  was getting seroquel, cymbalta, lyrica samples there . No longer has drug insurance as she fell behind on payments and was cut from her drug plan.  Would like discussion regarding her bone density.    Needs new psychiatric referral and samples if we have any, and clonazepam due to severe anixety chronic.  Depressive symtpoms stable as long as she cont's her meds, denies suicidal ideation, or panic symptoms.  Tolerates meds when she can get them, but finances are severe. Pt denies CP, sob, doe, wheezing, orthopnea, pnd, worsening LE edema, palps, dizziness or syncope   Pt denies new neuro symptoms such as headache, facial or extremity weakness   Pt denies polydipsia, polyuria, or low sugar symptoms such as shakiness improved with eating.  Overall good compliance with meds, trying to follow low chol, DM diet, wt stable, little excercise however  Here for wellness Diet: Heart Healthy or DM if diabetic Physical Activities: Sedentary Depression/mood screen: Negative as above but chronic med need to remain stable Hearing: Intact bilateral Visual Acuity: Grossly normal ADL's: Capable  Fall Risk: None Home Safety: Good End-of-Life Planning: Advance directive - Full code/I agree   Problems Prior to Update: 1)  Osteopenia  (ICD-733.90) 2)  Health Screening  (ICD-V70.0) 3)  Inguinal Pain, Right   (ICD-789.09) 4)  Menopausal Disorder  (ICD-627.9) 5)  Ear Pain  (ICD-388.70) 6)  Asthmatic Bronchitis, Acute  (ICD-466.0) 7)  Shoulder Pain, Left, Chronic  (ICD-719.41) 8)  Osteoarthritis, Knees, Bilateral  (ICD-715.96) 9)  Cervical Radiculopathy, Left  (ICD-723.4) 10)  Osteoarthritis, Knee, Right  (ICD-715.96) 11)  Gastroenteritis  (ICD-558.9) 12)  Fatigue  (ICD-780.79) 13)  Cigarette Smoker  (ICD-305.1) 14)  Abnormality of Gait  (ICD-781.2) 15)  Fever Unspecified  (ICD-780.60) 16)  Family History of Cad Female 1st Degree Relative <60  (ICD-V16.49) 17)  Restless Leg Syndrome  (ICD-333.94) 18)  Peptic Ulcer Disease  (ICD-533.90) 19)  Gout  (ICD-274.9) 20)  Anemia-nos  (ICD-285.9) 21)  Common Migraine  (ICD-346.10) 22)  Peripheral Neuropathy  (ICD-356.9) 23)  COPD  (ICD-496) 24)  Peripheral Vascular Disease  (ICD-443.9) 25)  Hypothyroidism  (ICD-244.9) 26)  Seizure Disorder  (ICD-780.39) 27)  Renal Insufficiency  (ICD-588.9) 28)  Hypertension  (ICD-401.9) 29)  Hyperlipidemia  (ICD-272.4) 30)  Gerd  (ICD-530.81) 31)  Diabetes Mellitus, Type II  (ICD-250.00) 32)  Depression  (ICD-311) 33)  Allergic Rhinitis  (ICD-477.9)  Medications Prior to Update: 1)  Lyrica 50 Mg  Caps (Pregabalin) .... Take 1 Three Times A Day By Mouth Qd 2)  Glimepiride 1 Mg  Tabs (Glimepiride) .... Take 1 By Mouth Qd 3)  Seroquel 100 Mg  Tabs (Quetiapine  Fumarate) .... Take 1 Qhs 4)  Colchicine 0.6 Mg  Tabs (Colchicine) .... Take 1 By Mouth Two Times A Day 5)  Levothyroxine Sodium 112 Mcg Tabs (Levothyroxine Sodium) .Marland Kitchen.. 1 By Mouth Once Daily 6)  Allopurinol 100 Mg Tabs (Allopurinol) .Marland Kitchen.. 1 By Mouth Once Daily 7)  Furosemide 80 Mg  Tabs (Furosemide) .... 3 By Mouth Once Daily 8)  Proair Hfa 108 (90 Base) Mcg/act Aers (Albuterol Sulfate) .... 2 Puffs Up To Four Times Per Day As Needed 9)  Cymbalta 30 Mg  Cpep (Duloxetine Hcl) .Marland Kitchen.. 1 By Mouth Qd 10)  Januvia 100 Mg  Tabs (Sitagliptin Phosphate) .... 1/4 By  Mouth Once Daily 11)  Tizanidine Hcl 4 Mg Tabs (Tizanidine Hcl) .Marland Kitchen.. 1 By Mouth Two Times A Day As Needed 12)  Alprazolam 0.5 Mg  Tabs (Alprazolam) .Marland Kitchen.. 1 By Mouth Two Times A Day As Needed 13)  Oxycodone Hcl 5 Mg Tabs (Oxycodone Hcl) .Marland Kitchen.. 1 By Mouth Qid As Needed Per Pain Management 14)  Nicoderm Cq 21 Mg/24hr Pt24 (Nicotine) .... Use Asd 1 Patch Once Daily 15)  Spiriva Handihaler 18 Mcg Caps (Tiotropium Bromide Monohydrate) .Marland Kitchen.. 1 Puff Once Daily 16)  Viagra 100 Mg Tabs (Sildenafil Citrate) .... Per Gyn 17)  Lipitor 40 Mg Tabs (Atorvastatin Calcium) .Marland Kitchen.. 1po Once Daily 18)  Protonix 40 Mg Tbec (Pantoprazole Sodium) .Marland Kitchen.. 1po Once Daily 19)  Promethazine Hcl 25 Mg Tabs (Promethazine Hcl) .Marland Kitchen.. 1 By Mouth Every 6 Hours As Needed 20)  Wellbutrin Sr 150 Mg Xr12h-Tab (Bupropion Hcl) .Marland Kitchen.. 1 By Mouth Once Daily  Current Medications (verified): 1)  Glimepiride 1 Mg  Tabs (Glimepiride) .... Take 1 By Mouth Qd 2)  Seroquel 100 Mg  Tabs (Quetiapine Fumarate) .Marland Kitchen.. 1 By Mouth At Bedtime 3)  Levothyroxine Sodium 112 Mcg Tabs (Levothyroxine Sodium) .Marland Kitchen.. 1 By Mouth Once Daily 4)  Allopurinol 100 Mg Tabs (Allopurinol) .Marland Kitchen.. 1 By Mouth Once Daily 5)  Furosemide 80 Mg  Tabs (Furosemide) .... 3 By Mouth Once Daily 6)  Proair Hfa 108 (90 Base) Mcg/act Aers (Albuterol Sulfate) .... 2 Puffs Up To Four Times Per Day As Needed 7)  Cymbalta 60 Mg Cpep (Duloxetine Hcl) .Marland Kitchen.. 1 By Mouth Once Daily 8)  Januvia 100 Mg  Tabs (Sitagliptin Phosphate) .... 1/4 By Mouth Once Daily 9)  Tizanidine Hcl 4 Mg Tabs (Tizanidine Hcl) .Marland Kitchen.. 1 By Mouth Two Times A Day As Needed 10)  Clonazepam 2 Mg Tabs (Clonazepam) .Marland Kitchen.. 1po Two Times A Day 11)  Oxycodone Hcl 5 Mg Tabs (Oxycodone Hcl) .Marland Kitchen.. 1 By Mouth Qid As Needed Per Pain Management 12)  Spiriva Handihaler 18 Mcg Caps (Tiotropium Bromide Monohydrate) .Marland Kitchen.. 1 Puff Once Daily 13)  Lipitor 40 Mg Tabs (Atorvastatin Calcium) .Marland Kitchen.. 1po Once Daily 14)  Protonix 40 Mg Tbec (Pantoprazole Sodium)  .Marland Kitchen.. 1po Once Daily 15)  Promethazine Hcl 25 Mg Tabs (Promethazine Hcl) .Marland Kitchen.. 1 By Mouth Every 6 Hours As Needed 16)  Wellbutrin Sr 150 Mg Xr12h-Tab (Bupropion Hcl) .Marland Kitchen.. 1 By Mouth Once Daily 17)  Tramadol Hcl 50 Mg Tabs (Tramadol Hcl) .Marland Kitchen.. 1po Two Times A Day  - Per Pain Md 18)  Oxycontin 10 Mg Xr12h-Tab (Oxycodone Hcl) .Marland Kitchen.. 1 By Mouth Two Times A Day - Per Pain Clinic Md 19)  Lyrica 50 Mg Caps (Pregabalin) .... 2 By Mouth Three Times A Day  Allergies (verified): 1)  ! * Actos 2)  Nsaids  Past History:  Family History: Last updated: 07/23/2008 Family History of CAD  Female 1st degree relative <60 Family History High cholesterol Family History Hypertension Family History Ovarian cancer Family History of Stroke F 1st degree relative <60 mother with dementia  Social History: Last updated: 12/02/2007 Alcohol use-no Single - marriage annulled Current Smoker no children disabled - c-spine and back pain  Risk Factors: Smoking Status: current (09/17/2007)  Past Medical History: Allergic rhinitis Depression Diabetes mellitus, type II GERD Hyperlipidemia Hypertension Renal insufficiency Seizure disorder Hypothyroidism Peripheral vascular disease Chronic Back Pain/lumbar disc disease/spinal stenosis Heart Murmur COPD Peripheral Neuropathy with pain migraine Anemia-NOS Gout Peptic ulcer disease RLS Osteopenia  Past Surgical History: Left toe amputated (2006) Bunionectomy (1980) Goiter Removal (1997) Stress Cardiolite (06/18/2006) Tranthoracic Echocardiogram (06/18/2006) EKG (05/29/2006) Cholecystectomy Rotator cuff repair - left  - dr Lajoyce Corners  Review of Systems  The patient denies anorexia, fever, vision loss, decreased hearing, hoarseness, chest pain, syncope, dyspnea on exertion, peripheral edema, prolonged cough, headaches, hemoptysis, abdominal pain, melena, hematochezia, severe indigestion/heartburn, hematuria, incontinence, suspicious skin lesions, transient  blindness, difficulty walking, unusual weight change, abnormal bleeding, enlarged lymph nodes, and angioedema.         all otherwise negative per pt - 12 system review done  - except for bialt hsoulder pain lef t> right - plans to see dr duda   Physical Exam  General:  alert and overweight-appearing.   Head:  normocephalic and atraumatic.   Eyes:  vision grossly intact, pupils equal, and pupils round.   Ears:  R ear normal and L ear normal.   Nose:  no external deformity and no nasal discharge.   Mouth:  no gingival abnormalities and pharynx pink and moist.   Neck:  supple and no masses.   Lungs:  normal respiratory effort, R decreased breath sounds, and L decreased breath sounds.   Heart:  normal rate and regular rhythm.   Abdomen:  soft, non-tender, and normal bowel sounds.   Msk:  no joint tenderness and no joint swelling.   Extremities:  no edema, no erythema  Neurologic:  alert & oriented X3 and cranial nerves II-XII intact.   Skin:  color normal and no rashes.   Psych:  depressed affect and moderately anxious.     Impression & Recommendations:  Problem # 1:  OSTEOPENIA (ICD-733.90) d/w pt - Continue all previous medications as before this visit , check f/u dxa 2 yrs  Problem # 2:  DEPRESSION (ICD-311)  Her updated medication list for this problem includes:    Cymbalta 60 Mg Cpep (Duloxetine hcl) .Marland Kitchen... 1 by mouth once daily    Clonazepam 2 Mg Tabs (Clonazepam) .Marland Kitchen... 1po two times a day    Wellbutrin Sr 150 Mg Xr12h-tab (Bupropion hcl) .Marland Kitchen... 1 by mouth once daily  Orders: Psychiatric Referral (Psych) Prescription Created Electronically [phone]) chronic persistnet with marked anxiety;  treat as above, f/u any worsening signs or symptoms , refer psychiatry for longterm f/u  Problem # 3:  DIABETES MELLITUS, TYPE II (ICD-250.00)  Her updated medication list for this problem includes:    Glimepiride 1 Mg Tabs (Glimepiride) .Marland Kitchen... Take 1 by mouth qd    Januvia 100 Mg Tabs  (Sitagliptin phosphate) .Marland Kitchen... 1/4 by mouth once daily  Labs Reviewed: Creat: 2.6 (06/03/2009)    Reviewed HgBA1c results: 6.0 (06/03/2009)  6.4 (07/23/2008) stable overall by hx and exam, ok to continue meds/tx as is   Problem # 4:  Preventive Health Care (ICD-V70.0)  Overall doing well, age appropriate education and counseling updated and referral for appropriate preventive services done  unless declined, immunizations up to date or declined, diet counseling done if overweight, urged to quit smoking if smokes , most recent labs reviewed and current ordered if appropriate, ecg reviewed or declined (interpretation per ECG scanned in the EMR if done); information regarding Medicare Prevention requirements given if appropriate   Orders: First annual wellness visit with prevention plan  ([id number])  Problem # 5:  HYPERTENSION (ICD-401.9)  Her updated medication list for this problem includes:    Furosemide 80 Mg Tabs (Furosemide) .Marland KitchenMarland KitchenMarland KitchenMarland Kitchen 3 by mouth once daily  BP today: 136/80 Prior BP: 122/60 (05/25/2009)  Labs Reviewed: K+: 3.7 (06/03/2009) Creat: : 2.6 (06/03/2009)   Chol: 128 (06/03/2009)   HDL: 57.90 (06/03/2009)   LDL: 52 (06/03/2009)   TG: 93.0 (06/03/2009) stable overall by hx and exam, ok to continue meds/tx as is   Complete Medication List: 1)  Glimepiride 1 Mg Tabs (Glimepiride) .... Take 1 by mouth qd 2)  Seroquel 100 Mg Tabs (Quetiapine fumarate) .Marland Kitchen.. 1 by mouth at bedtime 3)  Levothyroxine Sodium 112 Mcg Tabs (Levothyroxine sodium) .Marland Kitchen.. 1 by mouth once daily 4)  Allopurinol 100 Mg Tabs (Allopurinol) .Marland Kitchen.. 1 by mouth once daily 5)  Furosemide 80 Mg Tabs (Furosemide) .... 3 by mouth once daily 6)  Proair Hfa 108 (90 Base) Mcg/act Aers (Albuterol sulfate) .... 2 puffs up to four times per day as needed 7)  Cymbalta 60 Mg Cpep (Duloxetine hcl) .Marland Kitchen.. 1 by mouth once daily 8)  Januvia 100 Mg Tabs (Sitagliptin phosphate) .... 1/4 by mouth once daily 9)  Tizanidine Hcl 4 Mg Tabs  (Tizanidine hcl) .Marland Kitchen.. 1 by mouth two times a day as needed 10)  Clonazepam 2 Mg Tabs (Clonazepam) .Marland Kitchen.. 1po two times a day 11)  Oxycodone Hcl 5 Mg Tabs (Oxycodone hcl) .Marland Kitchen.. 1 by mouth qid as needed per pain management 12)  Spiriva Handihaler 18 Mcg Caps (Tiotropium bromide monohydrate) .Marland Kitchen.. 1 puff once daily 13)  Lipitor 40 Mg Tabs (Atorvastatin calcium) .Marland Kitchen.. 1po once daily 14)  Protonix 40 Mg Tbec (Pantoprazole sodium) .Marland Kitchen.. 1po once daily 15)  Promethazine Hcl 25 Mg Tabs (Promethazine hcl) .Marland Kitchen.. 1 by mouth every 6 hours as needed 16)  Wellbutrin Sr 150 Mg Xr12h-tab (Bupropion hcl) .Marland Kitchen.. 1 by mouth once daily 17)  Tramadol Hcl 50 Mg Tabs (Tramadol hcl) .Marland Kitchen.. 1po two times a day  - per pain md 18)  Oxycontin 10 Mg Xr12h-tab (Oxycodone hcl) .Marland Kitchen.. 1 by mouth two times a day - per pain clinic md 19)  Lyrica 50 Mg Caps (Pregabalin) .... 2 by mouth three times a day  Patient Instructions: 1)  please see Tresa Endo in the office to pick up the forms that have been filled out 2)  Continue all previous medications as before this visit  3)  you are given the clonazepam refills 4)  You will be contacted about the referral(s) to: Psychiatry 5)  Please schedule a follow-up appointment in 6 months. Prescriptions: CLONAZEPAM 2 MG TABS (CLONAZEPAM) 1po two times a day  #60 x 3   Entered and Authorized by:   Corwin Levins MD   Signed by:   Corwin Levins MD on 09/22/2009   Method used:   Print then Give to Patient   RxID:   [id number]

## 2010-09-07 NOTE — Progress Notes (Signed)
Summary: sleep consult vs pulm- FYI  Phone Note Call from Patient Call back at [facility] Phone [phone]   Caller: Patient Call For: [facility] Summary of Call: pt called and wanted to make sure that her appt (consult) was for pulm- SOB. i advised her that this was for sleep per dr Jonny Ruiz (hypersominia). pt says this was to be for pulm so i advised her to call dr Raphael Gibney office to have them refer for pulm because [facility] doesn't take self referrals for pulm - only sleep. pt said she would call now. she stated that she was having difficluty sleeping and wanted to know if [facility] would address that as well. i advised her that he would only see her for "one complaint" and she would need another 30 min ov for the other...so she is aware.  Initial call taken by: Tivis Ringer, CNA,  August 17, 2010 10:55 AM  Follow-up for Phone Call        agree with above.  Will be happy to address either issue, but need to answer the question that Dr.John is sending her for.  Sleep issues and pulmonary issues are in totally different directions, and will INITIALLY need to be addressed at separate visits. Follow-up by: Barbaraann Share MD,  August 17, 2010 5:13 PM

## 2010-09-07 NOTE — Medication Information (Signed)
Summary: Glucose Testing Supp[facility]  [facility]   Imported By: Sherian Rein 12/09/2009 13:40:19  _____________________________________________________________________  External Attachment:    Type:   Image     Comment:   External Document

## 2010-09-08 ENCOUNTER — Encounter: Payer: Self-pay | Admitting: Internal Medicine

## 2010-09-08 ENCOUNTER — Ambulatory Visit (INDEPENDENT_AMBULATORY_CARE_PROVIDER_SITE_OTHER): Payer: Medicare Other | Admitting: Internal Medicine

## 2010-09-08 DIAGNOSIS — E785 Hyperlipidemia, unspecified: Secondary | ICD-10-CM

## 2010-09-08 DIAGNOSIS — J441 Chronic obstructive pulmonary disease with (acute) exacerbation: Secondary | ICD-10-CM | POA: Insufficient documentation

## 2010-09-08 DIAGNOSIS — R002 Palpitations: Secondary | ICD-10-CM

## 2010-09-08 DIAGNOSIS — Z01818 Encounter for other preprocedural examination: Secondary | ICD-10-CM

## 2010-09-08 DIAGNOSIS — J209 Acute bronchitis, unspecified: Secondary | ICD-10-CM

## 2010-09-08 HISTORY — DX: Palpitations: R00.2

## 2010-09-12 ENCOUNTER — Encounter: Payer: Self-pay | Admitting: Internal Medicine

## 2010-09-13 ENCOUNTER — Institutional Professional Consult (permissible substitution) (INDEPENDENT_AMBULATORY_CARE_PROVIDER_SITE_OTHER): Payer: Medicare Other | Admitting: Pulmonary Disease

## 2010-09-13 ENCOUNTER — Encounter: Payer: Self-pay | Admitting: Pulmonary Disease

## 2010-09-13 DIAGNOSIS — R0602 Shortness of breath: Secondary | ICD-10-CM

## 2010-09-13 NOTE — Letter (Signed)
Summary: [facility]  [facility]   Imported By: Lennie Odor 09/04/2010 12:16:15  _____________________________________________________________________  External Attachment:    Type:   Image     Comment:   External Document

## 2010-09-13 NOTE — Letter (Signed)
Summary: Cholesterol levels/[facility]  Cholesterol levels/[facility]   Imported By: Lester Roseland 09/05/2010 09:12:26  _____________________________________________________________________  External Attachment:    Type:   Image     Comment:   External Document

## 2010-09-18 ENCOUNTER — Other Ambulatory Visit (HOSPITAL_COMMUNITY): Payer: Self-pay | Admitting: Cardiology

## 2010-09-18 DIAGNOSIS — Z01818 Encounter for other preprocedural examination: Secondary | ICD-10-CM

## 2010-09-19 ENCOUNTER — Encounter: Payer: Self-pay | Admitting: Pulmonary Disease

## 2010-09-19 ENCOUNTER — Ambulatory Visit (INDEPENDENT_AMBULATORY_CARE_PROVIDER_SITE_OTHER): Payer: Medicare Other | Admitting: Pulmonary Disease

## 2010-09-19 ENCOUNTER — Encounter (INDEPENDENT_AMBULATORY_CARE_PROVIDER_SITE_OTHER): Payer: Medicare Other

## 2010-09-19 DIAGNOSIS — R0602 Shortness of breath: Secondary | ICD-10-CM

## 2010-09-19 DIAGNOSIS — J383 Other diseases of vocal cords: Secondary | ICD-10-CM

## 2010-09-19 DIAGNOSIS — J449 Chronic obstructive pulmonary disease, unspecified: Secondary | ICD-10-CM

## 2010-09-25 ENCOUNTER — Institutional Professional Consult (permissible substitution): Payer: Self-pay | Admitting: Emergency Medicine

## 2010-09-26 ENCOUNTER — Other Ambulatory Visit (HOSPITAL_COMMUNITY): Payer: Medicare Other

## 2010-09-27 NOTE — Assessment & Plan Note (Signed)
Summary: post [place] [facility] f/u   Vital Signs:  Patient profile:   70 year old female Height:      67.5 inches Weight:      189 pounds BMI:     29.27 O2 Sat:      97 % on Room air Temp:     99.0 degrees F oral Pulse rate:   90 / minute BP sitting:   158 / 84  (left arm) Cuff size:   large  Vitals Entered By: Margaret Pyle, CMA (September 08, 2010 1:11 PM)  O2 Flow:  Room air CC: Post ER F/U   Primary Care Provider:  Oliver Barre MD  CC:  Post ER F/U.  History of Present Illness: here to f/u after becoming anxious and sob;  ems called jan 31 - pt reports severe elev BP over 200 when ems arrived, but down to 163 sbp when got to the ER after oxygen and breathing tx applied en route;  labs/cxr/ecg essentially at baseline or neg, pt improved with xanax and d/c to home to cont outpt antibx as per her last visit here;  has appt to see also renal next wk (tues);  overall cont'd to do better after that with gradually improved breathing, cough, wheezing and sob.  Pt denies CP, worsening sob, doe, wheezing, orthopnea, pnd, worsening LE edema, dizziness or syncope  Pt denies new neuro symptoms such as headache, facial or extremity weakness  Pt denies polydipsia, polyuria, or low sugar symptoms such as shakiness improved with eating.  Overall good compliance with meds, trying to follow low chol, DM diet, wt stable, little excercise however.,  Menitons today her endo care for DM is being transferred to new Endo as Dr Dagoberto Ligas is retiring due to health.  Pt mentions in the meantime she found out even generic lipitor is very expensive and needs alternative.   also due to see dr clance next wk for pulm (wed) and needs clearance to get left shoulder surgury with Dr Waldron Labs.   Was recommended to see cardiology as well from the ER for palpiations , pt states much less palp's since being seen.    Problems Prior to Update: 1)  Palpitations  (ICD-785.1) 2)  Preoperative Examination  (ICD-V72.84) 3)   Chronic Obstructive Pulmonary Disease, Acute Exacerbation  (ICD-491.21) 4)  Bronchitis, Acute  (ICD-466.0) 5)  Dyspnea  (ICD-786.05) 6)  Persistent Disorder Initiating/maintaining Sleep  (ICD-307.42) 7)  Inadequate Sleep Hygiene  (ICD-307.49) 8)  Chest Pain  (ICD-786.50) 9)  Wheezing  (ICD-786.07) 10)  Hepatotoxicity, Drug-induced, Risk of  (ICD-V58.69) 11)  Shoulder Pain, Left  (ICD-719.41) 12)  Hypersomnia  (ICD-780.54) 13)  Anxiety  (ICD-300.00) 14)  Memory Loss  (ICD-780.93) 15)  Back Pain  (ICD-724.5) 16)  Abdominal Pain, Lower  (ICD-789.09) 17)  Colonic Polyps, Hx of  (ICD-V12.72) 18)  Abdominal Pain -generalized  (ICD-789.07) 19)  Personal Hx Colonic Polyps  (ICD-V12.72) 20)  Preventive Health Care  (ICD-V70.0) 21)  Osteopenia  (ICD-733.90) 22)  Health Screening  (ICD-V70.0) 23)  Inguinal Pain, Right  (ICD-789.09) 24)  Menopausal Disorder  (ICD-627.9) 25)  Ear Pain  (ICD-388.70) 26)  Asthmatic Bronchitis, Acute  (ICD-466.0) 27)  Shoulder Pain, Left, Chronic  (ICD-719.41) 28)  Osteoarthritis, Knees, Bilateral  (ICD-715.96) 29)  Cervical Radiculopathy, Left  (ICD-723.4) 30)  Osteoarthritis, Knee, Right  (ICD-715.96) 31)  Gastroenteritis  (ICD-558.9) 32)  Fatigue  (ICD-780.79) 33)  Cigarette Smoker  (ICD-305.1) 34)  Abnormality of Gait  (ICD-781.2) 35)  Fever Unspecified  (ICD-780.60) 36)  Family History of Cad Female 1st Degree Relative <60  (ICD-V16.49) 37)  Restless Leg Syndrome  (ICD-333.94) 38)  Peptic Ulcer Disease  (ICD-533.90) 39)  Gout  (ICD-274.9) 40)  Anemia-nos  (ICD-285.9) 41)  Common Migraine  (ICD-346.10) 42)  Peripheral Neuropathy  (ICD-356.9) 43)  COPD  (ICD-496) 44)  Peripheral Vascular Disease  (ICD-443.9) 45)  Hypothyroidism  (ICD-244.9) 46)  Seizure Disorder  (ICD-780.39) 47)  Renal Insufficiency  (ICD-588.9) 48)  Hypertension  (ICD-401.9) 49)  Hyperlipidemia  (ICD-272.4) 50)  Gerd  (ICD-530.81) 51)  Diabetes Mellitus, Type II   (ICD-250.00) 52)  Depression  (ICD-311) 53)  Allergic Rhinitis  (ICD-477.9)  Medications Prior to Update: 1)  Glimepiride 1 Mg  Tabs (Glimepiride) .... Take 1 By Mouth Qd 2)  Levothyroxine Sodium 125 Mcg Tabs (Levothyroxine Sodium) .Marland Kitchen.. 1po Once Daily 3)  Allopurinol 100 Mg Tabs (Allopurinol) .Marland Kitchen.. 1 By Mouth Once Daily 4)  Furosemide 80 Mg  Tabs (Furosemide) .... 3 By Mouth Once Daily 5)  Proair Hfa 108 (90 Base) Mcg/act Aers (Albuterol Sulfate) .... 2 Puffs Up To Four Times Per Day As Needed 6)  Cymbalta 60 Mg Cpep (Duloxetine Hcl) .Marland Kitchen.. 1 By Mouth Once Daily 7)  Januvia 100 Mg  Tabs (Sitagliptin Phosphate) .... 1/4 By Mouth Once Daily 8)  Flexeril 5 Mg Tabs (Cyclobenzaprine Hcl) .Marland Kitchen.. 1 By Mouth Three Times A Day As Needed 9)  Clonazepam 2 Mg Tabs (Clonazepam) .Marland Kitchen.. 1po Two Times A Day As Needed 10)  Spiriva Handihaler 18 Mcg Caps (Tiotropium Bromide Monohydrate) .Marland Kitchen.. 1 Puff Once Daily 11)  Lipitor 40 Mg Tabs (Atorvastatin Calcium) .Marland Kitchen.. 1po Once Daily 12)  Protonix 40 Mg Tbec (Pantoprazole Sodium) .Marland Kitchen.. 1po Once Daily 13)  Promethazine Hcl 25 Mg Tabs (Promethazine Hcl) .Marland Kitchen.. 1 By Mouth Every 6 Hours As Needed 14)  Lyrica 50 Mg Caps (Pregabalin) .... Take 1 Tab By Mouth At Bedtime 15)  Hydrocodone-Acetaminophen 7.5-325 Mg Tabs (Hydrocodone-Acetaminophen) .Marland Kitchen.. 1po Four Times Per Day As Needed Pain 16)  Trazodone Hcl 100 Mg Tabs (Trazodone Hcl) .Marland Kitchen.. 1po At Bedtime As Needed (When Does Not Have Seroquel) 17)  Colcrys 0.6 Mg Tabs (Colchicine) .Marland Kitchen.. 1po Once Daily 18)  Hydroxyzine Hcl 25 Mg Tabs (Hydroxyzine Hcl) .Marland Kitchen.. 1  Op Q 6 Hrs As Needed Nausea or Nerves 19)  Seroquel 100 Mg Tabs (Quetiapine Fumarate) .Marland Kitchen.. 1po At Bedtime in Place of The Trazodone 20)  Tessalon Perles 100 Mg Caps (Benzonatate) .Marland Kitchen.. 1-2 By Mouth Three Times A Day As Needed Cough 21)  Doxycycline Hyclate 100 Mg Caps (Doxycycline Hyclate) .Marland Kitchen.. 1po Two Times A Day 22)  Tizanidine Hcl 4 Mg Tabs (Tizanidine Hcl) .Marland Kitchen.. 1 By Mouth Three  Times A Day  Current Medications (verified): 1)  Glimepiride 1 Mg  Tabs (Glimepiride) .... Take 1 By Mouth Qd 2)  Levothyroxine Sodium 125 Mcg Tabs (Levothyroxine Sodium) .Marland Kitchen.. 1po Once Daily 3)  Allopurinol 100 Mg Tabs (Allopurinol) .Marland Kitchen.. 1 By Mouth Once Daily 4)  Furosemide 80 Mg  Tabs (Furosemide) .... 3 By Mouth Once Daily 5)  Proair Hfa 108 (90 Base) Mcg/act Aers (Albuterol Sulfate) .... 2 Puffs Up To Four Times Per Day As Needed 6)  Cymbalta 60 Mg Cpep (Duloxetine Hcl) .Marland Kitchen.. 1 By Mouth Once Daily 7)  Januvia 100 Mg  Tabs (Sitagliptin Phosphate) .... 1/4 By Mouth Once Daily 8)  Flexeril 5 Mg Tabs (Cyclobenzaprine Hcl) .Marland Kitchen.. 1 By Mouth Three Times A Day As Needed 9)  Clonazepam 2  Mg Tabs (Clonazepam) .Marland Kitchen.. 1po Two Times A Day As Needed 10)  Spiriva Handihaler 18 Mcg Caps (Tiotropium Bromide Monohydrate) .Marland Kitchen.. 1 Puff Once Daily 11)  Simvastatin 40 Mg Tabs (Simvastatin) .Marland Kitchen.. 1po Once Daily 12)  Protonix 40 Mg Tbec (Pantoprazole Sodium) .Marland Kitchen.. 1po Once Daily 13)  Promethazine Hcl 25 Mg Tabs (Promethazine Hcl) .Marland Kitchen.. 1 By Mouth Every 6 Hours As Needed 14)  Lyrica 50 Mg Caps (Pregabalin) .... Take 1 Tab By Mouth At Bedtime 15)  Hydrocodone-Acetaminophen 7.5-325 Mg Tabs (Hydrocodone-Acetaminophen) .Marland Kitchen.. 1po Four Times Per Day As Needed Pain 16)  Trazodone Hcl 100 Mg Tabs (Trazodone Hcl) .Marland Kitchen.. 1po At Bedtime As Needed (When Does Not Have Seroquel) 17)  Colcrys 0.6 Mg Tabs (Colchicine) .Marland Kitchen.. 1po Once Daily 18)  Hydroxyzine Hcl 25 Mg Tabs (Hydroxyzine Hcl) .Marland Kitchen.. 1  Op Q 6 Hrs As Needed Nausea or Nerves 19)  Seroquel 100 Mg Tabs (Quetiapine Fumarate) .Marland Kitchen.. 1po At Bedtime in Place of The Trazodone 20)  Tessalon Perles 100 Mg Caps (Benzonatate) .Marland Kitchen.. 1-2 By Mouth Three Times A Day As Needed Cough 21)  Doxycycline Hyclate 100 Mg Caps (Doxycycline Hyclate) .Marland Kitchen.. 1po Two Times A Day 22)  Tizanidine Hcl 4 Mg Tabs (Tizanidine Hcl) .Marland Kitchen.. 1 By Mouth Three Times A Day  Allergies (verified): 1)  ! * Actos 2)   Nsaids  Past History:  Past Medical History: Last updated: 05/26/2010 Allergic rhinitis Depression Diabetes mellitus, type II - Dr Meriel Flavors GERD Hyperlipidemia Hypertension Renal insufficiency - Dr Powell/trenal Seizure disorder Hypothyroidism Peripheral vascular disease Chronic Back Pain/lumbar disc disease/spinal stenosis - Dr Waldron Labs Heart Murmur COPD Peripheral Neuropathy with pain migraine Anemia-NOS Gout Peptic ulcer disease RLS Osteopenia COLON POLYPS-ADENOMATOUS Anxiety  Past Surgical History: Last updated: 09/22/2009 Left toe amputated (2006) Bunionectomy (1980) Goiter Removal (1997) Stress Cardiolite (06/18/2006) Tranthoracic Echocardiogram (06/18/2006) EKG (05/29/2006) Cholecystectomy Rotator cuff repair - left  - dr Lajoyce Corners  Family History: Last updated: 07/23/2008 Family History of CAD Female 1st degree relative <60 Family History High cholesterol Family History Hypertension Family History Ovarian cancer Family History of Stroke F 1st degree relative <60 mother with dementia  Social History: Last updated: 08/22/2010 Alcohol use-no Single and lives alone - marriage annulled Current Smoker.  started 1965.  1/2 ppd.   no children disabled - c-spine and back pain.  prev worked as a Programmer, systems.  Drug use-no  Risk Factors: Smoking Status: current (09/17/2007)  Review of Systems  The patient denies anorexia, fever, vision loss, decreased hearing, hoarseness, chest pain, syncope, peripheral edema, prolonged cough, headaches, hemoptysis, abdominal pain, melena, hematochezia, severe indigestion/heartburn, hematuria, muscle weakness, suspicious skin lesions, difficulty walking, unusual weight change, abnormal bleeding, enlarged lymph nodes, and angioedema.         all otherwise negative per pt -    Physical Exam  General:  alert and overweight-appearing.   Head:  normocephalic and atraumatic.   Eyes:  vision grossly intact, pupils equal, and  pupils round.   Ears:  bilat tms' with midl erythema, canals clear, sinus nontender Nose:  nasal dischargemucosal pallor and mucosal edema.   Mouth:  pharyngeal erythema and fair dentition.   Neck:  supple and cervical lymphadenopathy.   Lungs:  normal respiratory effort, R decreased breath sounds, and L decreased breath sounds., but no wheezing Heart:  normal rate and regular rhythm.   Abdomen:  soft, non-tender, and normal bowel sounds.   Msk:  no joint tenderness and no joint swelling.   Extremities:  no edema, no erythema  Neurologic:  strength normal in all extremities and gait normal.   Psych:  dysphoric affect and moderately anxious.     Impression & Recommendations:  Problem # 1:  BRONCHITIS, ACUTE (ICD-466.0) Assessment Improved  Her updated medication list for this problem includes:    Spiriva Handihaler 18 Mcg Caps (Tiotropium bromide monohydrate) .Marland Kitchen... 1 puff once daily    Tessalon Perles 100 Mg Caps (Benzonatate) .Marland Kitchen... 1-2 by mouth three times a day as needed cough improved, to finish meds as prescribed. f/u any worsening symtpoms and Dr Shelle Iron next wk  Problem # 2:  CHRONIC OBSTRUCTIVE PULMONARY DISEASE, ACUTE EXACERBATION (ICD-491.21) Assessment: Improved improved, I suspect near to baseline;  to finish med , f/u Dr Shelle Iron as planned for preop clearance  Problem # 3:  PREOPERATIVE EXAMINATION (ICD-V72.84)  will eval for preop clearnace with stress test in light of her palpitations and mult CRF's;  also refer to cardiology per pt request; o/w ok for surgury from medical standpoint  Orders: Cardiology Referral (Cardiology) Cardiolite (Cardiolite)  Problem # 4:  HYPERLIPIDEMIA (ICD-272.4)  Her updated medication list for this problem includes:    Simvastatin 40 Mg Tabs (Simvastatin) .Marland Kitchen... 1po once daily pt has been tx per endo ,  but lipitor generic simply too expensive ;  will change to simvastatin 40 mg per day;  does not have  appt with new endo   Labs  Reviewed: SGOT: 21 (07/20/2010)   SGPT: 14 (07/20/2010)   HDL:45.50 (07/20/2010), 55.00 (01/24/2010)  LDL:69 (07/20/2010), 52 (06/03/2009)  Chol:140 (07/20/2010), 176 (01/24/2010)  Trig:127.0 (07/20/2010), 208.0 (01/24/2010)  Complete Medication List: 1)  Glimepiride 1 Mg Tabs (Glimepiride) .... Take 1 by mouth qd 2)  Levothyroxine Sodium 125 Mcg Tabs (Levothyroxine sodium) .Marland Kitchen.. 1po once daily 3)  Allopurinol 100 Mg Tabs (Allopurinol) .Marland Kitchen.. 1 by mouth once daily 4)  Furosemide 80 Mg Tabs (Furosemide) .Marland Kitchen.. 1 to 2 tabs by mouth daily 5)  Cymbalta 60 Mg Cpep (Duloxetine hcl) .Marland Kitchen.. 1 by mouth once daily 6)  Januvia 100 Mg Tabs (Sitagliptin phosphate) .... 1/4 by mouth once daily 7)  Flexeril 5 Mg Tabs (Cyclobenzaprine hcl) .Marland Kitchen.. 1 by mouth three times a day as needed 8)  Clonazepam 2 Mg Tabs (Clonazepam) .Marland Kitchen.. 1po two times a day as needed 9)  Spiriva Handihaler 18 Mcg Caps (Tiotropium bromide monohydrate) .Marland Kitchen.. 1 puff once daily 10)  Simvastatin 40 Mg Tabs (Simvastatin) .Marland Kitchen.. 1po once daily 11)  Prilosec Otc 20 Mg Tbec (Omeprazole magnesium) .... Take 1 tablet by mouth once a day 12)  Promethazine Hcl 25 Mg Tabs (Promethazine hcl) .Marland Kitchen.. 1 by mouth every 6 hours as needed 13)  Lyrica 50 Mg Caps (Pregabalin) .... Take 2 tabs by mouth at bedtime 14)  Hydrocodone-acetaminophen 7.5-325 Mg Tabs (Hydrocodone-acetaminophen) .Marland Kitchen.. 1po four times per day as needed pain 15)  Trazodone Hcl 100 Mg Tabs (Trazodone hcl) .Marland Kitchen.. 1po at bedtime as needed (when does not have seroquel) 16)  Colcrys 0.6 Mg Tabs (Colchicine) .Marland Kitchen.. 1po once daily 17)  Hydroxyzine Hcl 25 Mg Tabs (Hydroxyzine hcl) .Marland Kitchen.. 1  op q 6 hrs as needed nausea or nerves 18)  Seroquel 100 Mg Tabs (Quetiapine fumarate) .... 1/2 tab  at bedtime in place of the trazodone 19)  Tessalon Perles 100 Mg Caps (Benzonatate) .Marland Kitchen.. 1-2 by mouth three times a day as needed cough 20)  Tizanidine Hcl 4 Mg Tabs (Tizanidine hcl) .Marland Kitchen.. 1 by mouth three times a day  Patient  Instructions: 1)  start the simvastatin 40 mg per  day (sent to your pharmacy) 2)  You will be contacted about the referral(s) to: cardiology and the stress test 3)  Plesae keep your appts with Dr Shelle Iron and Dr Lowell Guitar as you have planned 4)  You are cleared from medical standpoint for surgury, but you should get clearance as well from Dr Shelle Iron and your Cardiologist 5)  You should consider calling for your appt with your new endocrinologist next wk , if you are not called 6)  Continue all previous medications as before this visit  7)  Please schedule a follow-up appointment in 1 months, or sooner if needed, to check the cholesterol Prescriptions: SIMVASTATIN 40 MG TABS (SIMVASTATIN) 1po once daily  #90 x 3   Entered and Authorized by:   Corwin Levins MD   Signed by:   Corwin Levins MD on 09/08/2010   Method used:   Electronically to        [facility] (retail)       [address]       [place], [place]  [address]       Ph: [phone]       Fax: [phone]   RxID:   [id number] SIMVASTATIN 40 MG TABS (SIMVASTATIN) 1po once daily  #90 x 3   Entered and Authorized by:   Corwin Levins MD   Signed by:   Corwin Levins MD on 09/08/2010   Method used:   Print then Give to Patient   RxID:   [id number]    Orders Added: 1)  Cardiology Referral [Cardiology] 2)  Cardiolite [Cardiolite] 3)  Est. Patient Level V [64[address]]

## 2010-09-27 NOTE — Assessment & Plan Note (Signed)
Summary: consult for [name] [name]   Copy to:  Oliver Barre Primary Provider/Referring Provider:  Oliver Barre MD  CC:  Pulmonary Consult. Marland Kitchen  History of Present Illness: the pt is a 70y/o female who I have been asked to see for dyspnea.  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  Her breathing problem has not been progressive, and occurs primarily with exertion.  She is unsure how far she can walk due to knee and back problems.  She will get winded bringing groceries in from the car at times, but denies sob with sweeping one room of her house.  She denies chronic cough, but has had issues with recurrent asthmatic bronchitis related to her ongoing smoking.  She currently is going to a smoking cessation class at the cancer center, and has cut back to 10 cigs/day.  She denies worsening LE edema, but does have renal insuff. that is followed by [facility].  She has had a recent cxr which shows a poor depth of inspiration, and an elevated left HD that is chronic.  She has never had pfts.  Preventive Screening-Counseling & Management  Alcohol-Tobacco     Smoking Status: current     Smoking Cessation Counseling: yes     Packs/Day: 0.5     Tobacco Counseling: to quit use of tobacco products  Comments: continue classes at cancer center  Current Medications (verified): 1)  Glimepiride 1 Mg  Tabs (Glimepiride) .... Take 1 By Mouth Qd 2)  Levothyroxine Sodium 125 Mcg Tabs (Levothyroxine Sodium) .Marland Kitchen.. 1po Once Daily 3)  Allopurinol 100 Mg Tabs (Allopurinol) .Marland Kitchen.. 1 By Mouth Once Daily 4)  Furosemide 80 Mg  Tabs (Furosemide) .Marland Kitchen.. 1 To 2 Tabs By Mouth Daily 5)  Cymbalta 60 Mg Cpep (Duloxetine Hcl) .Marland Kitchen.. 1 By Mouth Once Daily 6)  Januvia 100 Mg  Tabs (Sitagliptin Phosphate) .... 1/4 By Mouth Once Daily 7)  Flexeril 5 Mg Tabs (Cyclobenzaprine Hcl) .Marland Kitchen.. 1 By Mouth Three Times A Day As Needed 8)  Clonazepam 2 Mg Tabs  (Clonazepam) .Marland Kitchen.. 1po Two Times A Day As Needed 9)  Spiriva Handihaler 18 Mcg Caps (Tiotropium Bromide Monohydrate) .Marland Kitchen.. 1 Puff Once Daily 10)  Simvastatin 40 Mg Tabs (Simvastatin) .Marland Kitchen.. 1po Once Daily 11)  Prilosec Otc 20 Mg Tbec (Omeprazole Magnesium) .... Take 1 Tablet By Mouth Once A Day 12)  Promethazine Hcl 25 Mg Tabs (Promethazine Hcl) .Marland Kitchen.. 1 By Mouth Every 6 Hours As Needed 13)  Lyrica 50 Mg Caps (Pregabalin) .... Take 2 Tabs By Mouth At Bedtime 14)  Hydrocodone-Acetaminophen 7.5-325 Mg Tabs (Hydrocodone-Acetaminophen) .Marland Kitchen.. 1po Four Times Per Day As Needed Pain 15)  Trazodone Hcl 100 Mg Tabs (Trazodone Hcl) .Marland Kitchen.. 1po At Bedtime As Needed (When Does Not Have Seroquel) 16)  Colcrys 0.6 Mg Tabs (Colchicine) .Marland Kitchen.. 1po Once Daily 17)  Hydroxyzine Hcl 25 Mg Tabs (Hydroxyzine Hcl) .Marland Kitchen.. 1  Op Q 6 Hrs As Needed Nausea or Nerves 18)  Seroquel 100 Mg Tabs (Quetiapine Fumarate) .... 1/2 Tab  At Bedtime in Place of The Trazodone 19)  Tessalon Perles 100 Mg Caps (Benzonatate) .Marland Kitchen.. 1-2 By Mouth Three Times A Day As Needed Cough 20)  Tizanidine Hcl 4 Mg Tabs (Tizanidine Hcl) .Marland Kitchen.. 1 By Mouth Three Times A Day  Allergies (verified): 1)  ! * Actos 2)  Nsaids  Past History:  Past Medical History: Reviewed history from 05/26/2010 and no changes required. Allergic  rhinitis Depression Diabetes mellitus, type II - Dr Meriel Flavors GERD Hyperlipidemia Hypertension Renal insufficiency - Dr Powell/trenal Seizure disorder Hypothyroidism Peripheral vascular disease Chronic Back Pain/lumbar disc disease/spinal stenosis - Dr Waldron Labs Heart Murmur COPD Peripheral Neuropathy with pain migraine Anemia-NOS Gout Peptic ulcer disease RLS Osteopenia COLON POLYPS-ADENOMATOUS Anxiety  Past Surgical History: Left foot... 2 toes amputated (2006) Bunionectomy (1980) Goiter Removal (1997) Stress Cardiolite (06/18/2006) Tranthoracic Echocardiogram (06/18/2006) EKG (05/29/2006) Cholecystectomy Rotator  cuff repair - left  - dr Lajoyce Corners  Family History: Reviewed history from 07/23/2008 and no changes required. Family History of CAD Female 1st degree relative <60 Family History High cholesterol Family History Hypertension.....brother Family History Ovarian cancer....sister Family History of Stroke F 1st degree relative <60 mother with dementia brother with stroke and MI  Social History: Reviewed history from 08/22/2010 and no changes required. Alcohol use-no Single and lives alone - marriage annulled Current Smoker.  started 1965.  1/2 ppd.   no children disabled - c-spine and back pain.  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  The patient denies non-productive cough, coughing up blood, chest pain, irregular heartbeats, loss of appetite, abdominal pain, sore throat, nasal congestion/difficulty breathing through nose, sneezing, ear ache, hand/feet swelling, and fever.    Vital Signs:  Patient profile:   70 year old female Height:      67.5 inches Weight:      189 pounds BMI:     29.27 O2 Sat:      97 % on Room air Temp:     98.1 degrees F oral Pulse rate:   85 / minute BP sitting:   142 / 70  (right arm) Cuff size:   regular  Vitals Entered By: Arman Filter LPN (September 13, 2010 11:47 AM)  O2 Flow:  Room air CC: Pulmonary Consult.  Comments Medications reviewed with patient Arman Filter LPN  September 13, 2010 11:47 AM    Physical Exam  General:  ow female in nad Eyes:  PERRLA and EOMI.   Nose:  patent without discharge mild turbinate hypertrophy Mouth:  elongation of soft palate and uvula no exudates or lesions seen Neck:  no  jvd, tmg , LN Lungs:  fairly clear to auscultation no wheezing or rhonchi Heart:  rrr, 1/6 sem Abdomen:  soft and nontender,bs+ Extremities:  1+ edema right>>left no cyanosis, pulses decreased but intact distally Neurologic:  alert and oriented, moves all 4.   Impression & Recommendations:  Problem # 1:  DYSPNEA (ICD-786.05) the pt has [name] [name] of unknown origin.  She carries the diagnosis of "copd", but has never had pfts.  She does continue to smoke, and describes to me recurrent episodes of acute bronchitis probably related to ongoing airway inflammation from her smoking.  She is also unable to give me an idea of how far she can walk due to concomitant knee and back issues, and I wonder if debility is contributing to her perception of [name] [name] as well.  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  Her left hemidiaphragm is also chronically elevated, and it may  be that she had phrenic nerve injury at the same time?  To put this issue to rest, I would like to do full pfts and see her back the same day.  Regardless of what these show, I have explained to her that she will have ongoing respiratory infections no matter what medications we treat her with if she continues to smoke.    Medications Added to Medication List This Visit: 1)  Furosemide 80 Mg Tabs (Furosemide) .Marland Kitchen.. 1 to 2 tabs by mouth daily 2)  Prilosec Otc 20 Mg Tbec (Omeprazole magnesium) .... Take 1 tablet by mouth once a day 3)  Lyrica 50 Mg Caps (Pregabalin) .... Take 2 tabs by mouth at bedtime 4)  Seroquel 100 Mg Tabs (Quetiapine fumarate) .... 1/2 tab  at bedtime in place of the trazodone  Other Orders: Consultation Level IV ([address]) Pulmonary Referral (Pulmonary) Consultation Level IV ([address]) Tobacco use cessation intermediate 3-10 minutes ([address])  Patient Instructions: 1)  work on stopping smoking prior to your surgery 2)   will set up for breathing studies, and will see you back on same day to discuss results.

## 2010-09-27 NOTE — Assessment & Plan Note (Signed)
Summary: PFT/LED- pt called back- i went over date/ time and PFT instr...   Allergies: 1)  ! * Actos 2)  Nsaids   Other Orders: Carbon Monoxide diffusing w/capacity ([address]) Lung Volumes/Gas dilution or washout ([address]) Spirometry (Pre & Post) [phone])

## 2010-09-27 NOTE — Assessment & Plan Note (Signed)
Summary: rov to review pfts.   Copy to:  Lajoyce Corners (Ortho) Primary Provider/Referring Provider:  Oliver Barre MD  CC:  OV to discuss PFT results.  Pt states she has decreased her smoking down to 6 cigs a day. Productive cough with green sputum. Marland Kitchen  History of Present Illness: the pt comes in today for f/u of her pfts, ordered as part of a w/u for doe.  She was found to have no airflow obstruction by FEV1%, but did have very mild airtrapping on her lung volumes.  She had no response to bronchodilators.  There was minimal restriction (probably due to her weight), and her DLCO was moderately reduced but corrected with alveolar volume adjustment.  I have reviewed the study with her in detail, and answered all of her questions.   Current Medications (verified): 1)  Glimepiride 1 Mg  Tabs (Glimepiride) .... Take 1 By Mouth Qd 2)  Levothyroxine Sodium 125 Mcg Tabs (Levothyroxine Sodium) .Marland Kitchen.. 1po Once Daily 3)  Allopurinol 100 Mg Tabs (Allopurinol) .Marland Kitchen.. 1 By Mouth Once Daily 4)  Furosemide 80 Mg  Tabs (Furosemide) .Marland Kitchen.. 1 To 2 Tabs By Mouth Daily 5)  Cymbalta 60 Mg Cpep (Duloxetine Hcl) .Marland Kitchen.. 1 By Mouth Once Daily 6)  Januvia 100 Mg  Tabs (Sitagliptin Phosphate) .... 1/4 By Mouth Once Daily 7)  Flexeril 5 Mg Tabs (Cyclobenzaprine Hcl) .Marland Kitchen.. 1 By Mouth Three Times A Day As Needed 8)  Clonazepam 2 Mg Tabs (Clonazepam) .Marland Kitchen.. 1po Two Times A Day As Needed 9)  Spiriva Handihaler 18 Mcg Caps (Tiotropium Bromide Monohydrate) .Marland Kitchen.. 1 Puff Once Daily 10)  Simvastatin 40 Mg Tabs (Simvastatin) .Marland Kitchen.. 1po Once Daily 11)  Prilosec Otc 20 Mg Tbec (Omeprazole Magnesium) .... Take 1 Tablet By Mouth Once A Day 12)  Promethazine Hcl 25 Mg Tabs (Promethazine Hcl) .Marland Kitchen.. 1 By Mouth Every 6 Hours As Needed 13)  Lyrica 50 Mg Caps (Pregabalin) .... Take 2 Tabs By Mouth At Bedtime 14)  Hydrocodone-Acetaminophen 7.5-325 Mg Tabs (Hydrocodone-Acetaminophen) .Marland Kitchen.. 1po Four Times Per Day As Needed Pain 15)  Trazodone Hcl 100 Mg Tabs (Trazodone  Hcl) .Marland Kitchen.. 1po At Bedtime As Needed (When Does Not Have Seroquel) 16)  Colcrys 0.6 Mg Tabs (Colchicine) .Marland Kitchen.. 1po Once Daily 17)  Hydroxyzine Hcl 25 Mg Tabs (Hydroxyzine Hcl) .Marland Kitchen.. 1  Op Q 6 Hrs As Needed Nausea or Nerves 18)  Seroquel 100 Mg Tabs (Quetiapine Fumarate) .... 1/2 Tab  At Bedtime in Place of The Trazodone 19)  Tizanidine Hcl 4 Mg Tabs (Tizanidine Hcl) .Marland Kitchen.. 1 By Mouth Three Times A Day 20)  Aspirin Ec Low Dose 81 Mg Tbec (Aspirin) .... Take 1 Tablet By Mouth Once A Day 21)  Cod Liver Oil  Caps (Cod Liver Oil) .... Take 1 Tablet By Mouth Once A Day 22)  Raw Kidney Tabs .... Take 1 Tablet By Mouth Once A Day 23)  Kidney Activator Tabs .... Take 1 Tablet By Mouth Once A Day 24)  Vitamin B, B12, E and Vitamin For Women. .... Take 1 Tablet By Mouth Once A Day  Allergies (verified): 1)  ! * Actos 2)  Nsaids  Social History: Alcohol use-no Single and lives alone - marriage annulled Current Smoker.  started 1965.  currently smoking 6 cigs a day.  no children disabled - c-spine and back pain.  prev worked as a Programmer, systems.  Drug use-no  Review of Systems       The patient complains of productive cough, acid heartburn, weight change, headaches,  nasal congestion/difficulty breathing through nose, itching, joint stiffness or pain, rash, and change in color of mucus.  The patient denies shortness of breath with activity, shortness of breath at rest, non-productive cough, coughing up blood, chest pain, irregular heartbeats, indigestion, loss of appetite, abdominal pain, difficulty swallowing, sore throat, tooth/dental problems, sneezing, ear ache, anxiety, depression, hand/feet swelling, and fever.    Vital Signs:  Patient profile:   70 year old female Height:      68 inches Weight:      186 pounds BMI:     28.38 O2 Sat:      100 % on Room air Temp:     98.3 degrees F oral Pulse rate:   83 / minute BP sitting:   130 / 60  (right arm) Cuff size:   large  Vitals Entered By: Arman Filter LPN (September 19, 2010 3:49 PM)  O2 Flow:  Room air CC: OV to discuss PFT results.  Pt states she has decreased her smoking down to 6 cigs a day. Productive cough with green sputum.  Comments Medications reviewed with patient Arman Filter LPN  September 19, 2010 3:49 PM    Physical Exam  General:  ow female in nad Lungs:  no wheezing or rhonchi Heart:  rrr Extremities:  minimal edema, no cyanosis  Neurologic:  alert and oriented, moves all 4.     Impression & Recommendations:  Problem # 1:  DYSPNEA (ICD-786.05)  the pt has minimal airflow obstruction on her pfts today primarily manifested as airtrapping.  Therefore, her airway symptoms are primarily due to inflammation from her ongoing smoking.  She does not have clinically signficant COPD.  She can stay on spiriva if she thinks it helps, but I would expect this is not necessary if she were to quit smoking.  Her primary pulmonary risk for general anesthesia is related to her smoking, not lung disease.  I have asked her to try and stop 2 weeks before the procedure.  She requires no further pulmonary f/u, and will send a note to her orthopedic surgeon.    Problem # 2:  OTHER DISEASES OF VOCAL CORDS (ICD-478.5)  the pt has classic "wheezing" from her upper airway when she lies down at night due to her known unilateral vocal cord paralysis.  Medications Added to Medication List This Visit: 1)  Aspirin Ec Low Dose 81 Mg Tbec (Aspirin) .... Take 1 tablet by mouth once a day 2)  Cod Liver Oil Caps (Cod liver oil) .... Take 1 tablet by mouth once a day 3)  Raw Kidney Tabs  .... Take 1 tablet by mouth once a day 4)  Kidney Activator Tabs  .... Take 1 tablet by mouth once a day 5)  Vitamin B, B12, E and Vitamin For Women.  .... Take 1 tablet by mouth once a day 6)  Proair Hfa 108 (90 Base) Mcg/act Aers (Albuterol sulfate) .... 2 puffs every 4-6 hours as needed  Other Orders: Est. Patient Level III (04540)  Patient  Instructions: 1)  stop smoking!! this is the key to you not getting sick and breathing better. 2)  stay on spiriva for now with albuterol for rescue only...you will not require any inhalers if you quit smoking. 3)  work on weight loss  Prescriptions: PROAIR HFA 108 (90 BASE) MCG/ACT  AERS (ALBUTEROL SULFATE) 2 puffs every 4-6 hours as needed  #1 x 3   Entered and Authorized by:   Barbaraann Share MD  Signed by:   Barbaraann Share MD on 09/19/2010   Method used:   Print then Give to Patient   RxID:   [id number]

## 2010-10-02 ENCOUNTER — Telehealth (INDEPENDENT_AMBULATORY_CARE_PROVIDER_SITE_OTHER): Payer: Self-pay | Admitting: Radiology

## 2010-10-02 DIAGNOSIS — R011 Cardiac murmur, unspecified: Secondary | ICD-10-CM

## 2010-10-02 DIAGNOSIS — Z8679 Personal history of other diseases of the circulatory system: Secondary | ICD-10-CM | POA: Insufficient documentation

## 2010-10-02 HISTORY — DX: Cardiac murmur, unspecified: R01.1

## 2010-10-03 ENCOUNTER — Encounter: Payer: Self-pay | Admitting: Cardiovascular Disease

## 2010-10-03 ENCOUNTER — Ambulatory Visit (HOSPITAL_COMMUNITY): Payer: Medicare Other | Attending: Internal Medicine

## 2010-10-03 ENCOUNTER — Encounter: Payer: Self-pay | Admitting: Cardiology

## 2010-10-03 ENCOUNTER — Ambulatory Visit (INDEPENDENT_AMBULATORY_CARE_PROVIDER_SITE_OTHER): Payer: Medicare Other | Admitting: Cardiology

## 2010-10-03 DIAGNOSIS — Z01810 Encounter for preprocedural cardiovascular examination: Secondary | ICD-10-CM

## 2010-10-03 DIAGNOSIS — R0602 Shortness of breath: Secondary | ICD-10-CM

## 2010-10-03 DIAGNOSIS — E7800 Pure hypercholesterolemia, unspecified: Secondary | ICD-10-CM

## 2010-10-03 DIAGNOSIS — R002 Palpitations: Secondary | ICD-10-CM | POA: Insufficient documentation

## 2010-10-03 DIAGNOSIS — Z01818 Encounter for other preprocedural examination: Secondary | ICD-10-CM | POA: Insufficient documentation

## 2010-10-03 DIAGNOSIS — R0989 Other specified symptoms and signs involving the circulatory and respiratory systems: Secondary | ICD-10-CM

## 2010-10-03 NOTE — Letter (Signed)
Summary: [facility]   Imported By: Sherian Rein 09/25/2010 08:13:59  _____________________________________________________________________  External Attachment:    Type:   Image     Comment:   External Document

## 2010-10-11 ENCOUNTER — Encounter (INDEPENDENT_AMBULATORY_CARE_PROVIDER_SITE_OTHER): Payer: Self-pay | Admitting: *Deleted

## 2010-10-11 ENCOUNTER — Telehealth: Payer: Self-pay | Admitting: Cardiovascular Disease

## 2010-10-11 ENCOUNTER — Telehealth: Payer: Self-pay | Admitting: Cardiology

## 2010-10-12 NOTE — Assessment & Plan Note (Addendum)
Summary: cardiolite/dx pre op orders/aarp med complete/deborah 766/ ja...  Nuclear Med Background Indications for Stress Test: Evaluation for Ischemia, Surgical Clearance, Post Hospital  Indications Comments: Pending Lt. shoulder surgery-Dr. Lajoyce Corners  History: Asthma, Echo, Myocardial Perfusion Study  History Comments: '07 Echo-EF=70% PFO with L>R shunting at rest / MPS (-) isch. Ef=77%  Symptoms: DOE, Palpitations    Nuclear Pre-Procedure Cardiac Risk Factors: Family History - CAD, Hypertension, Lipids, NIDDM, PVD, Smoker Caffeine/Decaff Intake: none NPO After: 7:00 PM Lungs: clear IV 0.9% NS with Angio Cath: 22g     IV Site: R Forearm IV Started by: Cathlyn Parsons, RN Chest Size (in) 44     Cup Size C     Height (in): 68 Weight (lb): 172 BMI: 26.25 Tech Comments: BS this am 109.  Nuclear Med Study 1 or 2 day study:  1 day     Stress Test Type:  Lexiscan Reading MD:  Charlton Haws, MD     Referring MD:  J.John Resting Radionuclide:  Technetium 77m Tetrofosmin     Resting Radionuclide Dose:  10.7 mCi  Stress Radionuclide:  Technetium 86m Tetrofosmin     Stress Radionuclide Dose:  33 mCi   Stress Protocol  Max Systolic BP: 130 mm Hg Lexiscan: 0.4 mg   Stress Test Technologist:  Milana Na, EMT-P     Nuclear Technologist:  Domenic Polite, CNMT  Rest Procedure  Myocardial perfusion imaging was performed at rest 45 minutes following the intravenous administration of Technetium 5m Tetrofosmin.  Stress Procedure  The patient received IV Lexiscan 0.4 mg over 15-seconds.  Technetium 107m Tetrofosmin injected at 30-seconds.  There were no significant changes with infusion.  Quantitative spect images were obtained after a 45 minute delay.  QPS Raw Data Images:  Normal; no motion artifact; normal heart/lung ratio. Stress Images:  Normal homogeneous uptake in all areas of the myocardium. Rest Images:  Normal homogeneous uptake in all areas of the myocardium. Subtraction  (SDS):  SDS 1 Transient Ischemic Dilatation:  .99  (Normal <1.22)  Lung/Heart Ratio:  .10  (Normal <0.45)  Quantitative Gated Spect Images QGS EDV:  63 ml QGS ESV:  25 ml QGS EF:  60 % QGS cine images:  normal  Findings Normal nuclear study      Overall Impression  Exercise Capacity: Lexiscan with no exercise. BP Response: Normal blood pressure response. Clinical Symptoms: Flushed ECG Impression: No significant ST segment change suggestive of ischemia. Overall Impression: Normal stress nuclear study.  Appended Document: cardiolite/dx pre op orders/aarp med complete/deborah 766/ ja... LMOPT - labs negative, normal, or stable    Appended Document: cardiolite/dx pre op orders/aarp med complete/deborah 766/ ja... ok

## 2010-10-12 NOTE — Letter (Signed)
Summary: [facility]  [facility]   Imported By: Marylou Mccoy 10/03/2010 09:19:21  _____________________________________________________________________  External Attachment:    Type:   Image     Comment:   External Document

## 2010-10-12 NOTE — Assessment & Plan Note (Signed)
Summary: new pt/palpitations/aarp medicare complete/deborah 776/james ...   Referring Provider:  Waldron Labs) Primary Provider:  Oliver Barre MD   History of Present Illness:  70 year old female for preoperative evaluation. Echocardiogram was performed on June 18, 2006.  Her LV function was normal.  There was no significant valvular abnormality noted.  She also had a Myoview performed to exclude ischemia, and her ejection fraction was 77%.  There was a small fixed defect at the apex, likely attenuation artifact.  There was no ischemia noted. Patient is scheduled for arthroscopic shoulder surgery. We were asked to see preoperatively. She denies dyspnea on exertion but has limited mobility. There is no orthopnea, PND, pedal edema, syncope or chest pain.  Current Medications (verified): 1)  Glimepiride 1 Mg  Tabs (Glimepiride) .... Take 1 By Mouth Qd 2)  Levothyroxine Sodium 125 Mcg Tabs (Levothyroxine Sodium) .Marland Kitchen.. 1po Once Daily 3)  Allopurinol 100 Mg Tabs (Allopurinol) .Marland Kitchen.. 1 By Mouth Once Daily 4)  Furosemide 80 Mg  Tabs (Furosemide) .Marland Kitchen.. 1 To 2 Tabs By Mouth Daily 5)  Cymbalta 60 Mg Cpep (Duloxetine Hcl) .Marland Kitchen.. 1 By Mouth Once Daily 6)  Januvia 100 Mg  Tabs (Sitagliptin Phosphate) .... 1/4 By Mouth Once Daily 7)  Flexeril 5 Mg Tabs (Cyclobenzaprine Hcl) .Marland Kitchen.. 1 By Mouth Three Times A Day As Needed 8)  Clonazepam 2 Mg Tabs (Clonazepam) .Marland Kitchen.. 1po Two Times A Day As Needed 9)  Spiriva Handihaler 18 Mcg Caps (Tiotropium Bromide Monohydrate) .Marland Kitchen.. 1 Puff Once Daily 10)  Simvastatin 40 Mg Tabs (Simvastatin) .Marland Kitchen.. 1po Once Daily 11)  Prilosec Otc 20 Mg Tbec (Omeprazole Magnesium) .... Take 1 Tablet By Mouth Once A Day 12)  Promethazine Hcl 25 Mg Tabs (Promethazine Hcl) .Marland Kitchen.. 1 By Mouth Every 6 Hours As Needed 13)  Lyrica 50 Mg Caps (Pregabalin) .... Take 2 Tabs By Mouth At Bedtime 14)  Hydrocodone-Acetaminophen 7.5-325 Mg Tabs (Hydrocodone-Acetaminophen) .Marland Kitchen.. 1po Four Times Per Day As Needed Pain 15)   Trazodone Hcl 100 Mg Tabs (Trazodone Hcl) .Marland Kitchen.. 1po At Bedtime As Needed (When Does Not Have Seroquel) 16)  Colcrys 0.6 Mg Tabs (Colchicine) .Marland Kitchen.. 1po Once Daily 17)  Hydroxyzine Hcl 25 Mg Tabs (Hydroxyzine Hcl) .Marland Kitchen.. 1  Op Q 6 Hrs As Needed Nausea or Nerves 18)  Seroquel 100 Mg Tabs (Quetiapine Fumarate) .... 1/2 Tab  At Bedtime in Place of The Trazodone 19)  Tizanidine Hcl 4 Mg Tabs (Tizanidine Hcl) .Marland Kitchen.. 1 By Mouth Three Times A Day 20)  Aspirin Ec Low Dose 81 Mg Tbec (Aspirin) .... Take 1 Tablet By Mouth Once A Day 21)  Cod Liver Oil  Caps (Cod Liver Oil) .... Take 1 Tablet By Mouth Once A Day 22)  Raw Kidney Tabs .... Take 1 Tablet By Mouth Once A Day 23)  Kidney Activator Tabs .... Take 1 Tablet By Mouth Once A Day 24)  Vitamin B, B12, E and Vitamin For Women. .... Take 1 Tablet By Mouth Once A Day 25)  Proair Hfa 108 (90 Base) Mcg/act  Aers (Albuterol Sulfate) .... 2 Puffs Every 4-6 Hours As Needed  Allergies: 1)  ! * Actos 2)  Nsaids  Past History:  Past Medical History: HYPERLIPIDEMIA CHRONIC OBSTRUCTIVE PULMONARY DISEASE HYPERSOMNIA ANXIETY  MEMORY LOSS  COLONIC POLYPS MENOPAUSAL DISORDER GOITER OSTEOARTHRITIS, KNEES, BILATERAL  RESTLESS LEG SYNDROME  PEPTIC ULCER DISEASE  GOUT  ANEMIA-NOS COMMON MIGRAINE  PERIPHERAL NEUROPATHY  PERIPHERAL VASCULAR DISEASE  HYPOTHYROIDISM  SEIZURE DISORDER  Diabetes mellitus, type II - Dr Meriel Flavors  Renal insufficiency - Dr Powell/trenal Chronic Back Pain/lumbar disc disease/spinal stenosis - Dr Waldron Labs Peripheral Neuropathy with pain  Past Surgical History: Left foot... 2 toes amputated (2006) Bunionectomy (1980) Goiter Removal (1997) Cholecystectomy Rotator cuff repair - left  - dr Lajoyce Corners  Family History: Reviewed history from 09/13/2010 and no changes required. Family History of CAD Female 1st degree relative <60 BROTHER WITH MI AT AGE 29 Family History High cholesterol Family History Hypertension.....brother Family  History Ovarian cancer....sister Family History of Stroke F 1st degree relative <60 mother with dementia  Social History: Reviewed history from 09/19/2010 and no changes required. Alcohol use-no Single and lives alone - marriage annulled Current Smoker.  started 1965.  currently smoking 6 cigs a day.  no children disabled - c-spine and back pain.  prev worked as a Programmer, systems.  Drug use-no  Review of Systems       arthralgias but no fevers or chills, productive cough, hemoptysis, dysphasia, odynophagia, melena, hematochezia, dysuria, hematuria, rash, seizure activity, orthopnea, PND, pedal edema, claudication. Remaining systems are negative.   Vital Signs:  Patient profile:   70 year old female Height:      68 inches Weight:      186 pounds BMI:     28.38 Pulse rate:   90 / minute Resp:     16 per minute BP sitting:   130 / 80  (left arm)  Vitals Entered By: Kem Parkinson (October 03, 2010 12:39 PM)  Physical Exam  General:  Well developed/well nourished in NAD Skin warm/dry Patient not depressed No peripheral clubbing Back-normal HEENT-normal/normal eyelids Neck supple/normal carotid upstroke bilaterally; no bruits; no JVD; no thyromegaly chest - CTA/ normal expansion CV - RRR/normal S1 and S2; no murmurs, rubs or gallops;  PMI nondisplaced Abdomen -NT/ND, no HSM, no mass, + bowel sounds, no bruit 2+ femoral pulses, no bruits Ext-no edema, chords, 2+ DP Neuro-grossly nonfocal     Impression & Recommendations:  Problem # 1:  PREOPERATIVE EXAMINATION (ICD-V72.84) Patient had a Myoview today. We will await the final results. If negative she may proceed with surgery.  Problem # 2:  HYPERTENSION (ICD-401.9) Blood pressure controlled. Her updated medication list for this problem includes:    Furosemide 80 Mg Tabs (Furosemide) .Marland Kitchen... 1 to 2 tabs by mouth daily    Aspirin Ec Low Dose 81 Mg Tbec (Aspirin) .Marland Kitchen... Take 1 tablet by mouth once a day  Problem # 3:   HYPERLIPIDEMIA (ICD-272.4) Continue statin. Managed by primary care. Her updated medication list for this problem includes:    Simvastatin 40 Mg Tabs (Simvastatin) .Marland Kitchen... 1po once daily  Problem # 4:  CIGARETTE SMOKER (ICD-305.1) Patient counseled on discontinuing.  Nuclear Med Background Indications for Stress Test: Evaluation for Ischemia, Surgical Clearance, Post Hospital  Indications Comments: Pending Lt. shoulder surgery-Dr. Lajoyce Corners  History: Asthma, Echo, Myocardial Perfusion Study  History Comments: '07 Echo-EF=70% PFO with L>R shunting at rest / MPS (-) isch. Ef=77%  Symptoms: DOE, Palpitations    Nuclear Pre-Procedure Cardiac Risk Factors: Family History - CAD, Hypertension, Lipids, NIDDM, PVD, Smoker Height (in): 68 Weight (lb): 186 BMI: 28.38

## 2010-10-12 NOTE — Progress Notes (Signed)
Summary: Nuclear Pre-Procedure  Phone Note Outgoing Call Call back at [facility] Phone [phone]   Call placed by: Stanton Kidney, EMT-P,  October 02, 2010 11:10 AM Call placed to: Patient Action Taken: Phone Call Completed Reason for Call: Confirm/change Appt Summary of Call: Reviewed information on Myoview Information Sheet (see scanned document for further details).  Spoke with the patient. Stanton Kidney, EMT-P  October 02, 2010 11:10 AM      Nuclear Med Background Indications for Stress Test: Evaluation for Ischemia, Surgical Clearance, Post Hospital  Indications Comments: Pending Lt. shoulder surgery-Dr. Lajoyce Corners  History: Asthma, Echo, Myocardial Perfusion Study  History Comments: '07 Echo-EF=70% PFO with L>R shunting at rest / MPS (-) isch. Ef=77%  Symptoms: DOE, Palpitations    Nuclear Pre-Procedure Cardiac Risk Factors: Family History - CAD, Hypertension, Lipids, NIDDM, PVD, Smoker Height (in): 68

## 2010-10-17 NOTE — Progress Notes (Signed)
Summary: rtn call from [name]  Phone Note Call from Patient Call back at Home Phone [phone]   Caller: Patient Reason for Call: Talk to Nurse Summary of Call: rtn called from [name].  Initial call taken by: Lorne Skeens,  October 11, 2010 1:55 PM  Follow-up for Phone Call        I will forward  the stress test to Dr. Jens Som to review for surgical clearance. She is very anxious and needs to have shoulder surgery soon due to pain.  [name] [name] [name] RN  October 11, 2010 2:16 PM  Follow-up by: Ellender Hose RN,  October 11, 2010 2:17 PM  Additional Follow-up for Phone Call Additional follow up Details #1::        ok fo surgery Ferman Hamming, MD, [facility]  October 11, 2010 3:03 PM  pt aware, note generated and faxed to dr duda Deliah Goody, RN  October 11, 2010 3:26 PM

## 2010-10-17 NOTE — Letter (Signed)
Summary: Clearance Letter  [facility], Main Office  [address]   [place], [place] [address]   Phone: [phone]  Fax: [phone]    October 11, 2010  Re:     [facility] Address:   [address]     [place], [place]  [address] DOB:     1940-08-28 MRN:     [id number]   Dear Dr Lajoyce Corners,        Mrs. Fait is low risk cardiac wise for surgery. Please call with any questions or concerns.   Sincerely,  Deliah Goody, RN/Dr Olga Millers

## 2010-10-17 NOTE — Progress Notes (Signed)
Summary: PT CALLING RE SURG CLEARENCE  Phone Note Call from Patient   Caller: Patient [phone] Reason for Call: Talk to Nurse Summary of Call: RE SURG CLEARENCE-WAITING ON INFO FROM [place] TO SCHEDULE Initial call taken by: Glynda Jaeger,  October 11, 2010 11:44 AM  Follow-up for Phone Call        Will route patient's stress test to Dr. Jens Som for review pending surgery. Whitney Maeola [name] RN  October 11, 2010 1:03 PM  Follow-up by: Whitney Maeola [name] RN,  October 11, 2010 1:03 PM

## 2010-10-25 LAB — DIFFERENTIAL
Basophils Absolute: 0.1 10*3/uL (ref 0.0–0.1)
Lymphocytes Relative: 25 % (ref 12–46)
Monocytes Absolute: 0.5 10*3/uL (ref 0.1–1.0)
Monocytes Relative: 10 % (ref 3–12)
Neutro Abs: 2.8 10*3/uL (ref 1.7–7.7)

## 2010-10-25 LAB — BASIC METABOLIC PANEL
Calcium: 9.4 mg/dL (ref 8.4–10.5)
GFR calc Af Amer: 24 mL/min — ABNORMAL LOW (ref >60)
GFR calc non Af Amer: 20 mL/min — ABNORMAL LOW (ref >60)
Sodium: 140 mEq/L (ref 135–145)

## 2010-10-25 LAB — CBC
Hemoglobin: 11.3 g/dL — ABNORMAL LOW (ref 12.0–15.0)
RBC: 3.49 MIL/uL — ABNORMAL LOW (ref 3.87–5.11)
RDW: 15.1 % (ref 11.5–15.5)

## 2010-10-31 ENCOUNTER — Encounter: Payer: Self-pay | Admitting: Internal Medicine

## 2010-12-06 ENCOUNTER — Other Ambulatory Visit: Payer: Self-pay | Admitting: Internal Medicine

## 2010-12-06 NOTE — Telephone Encounter (Signed)
Faxed hardcopy to pharmacy. 

## 2010-12-12 ENCOUNTER — Encounter: Payer: Self-pay | Admitting: Internal Medicine

## 2010-12-12 DIAGNOSIS — Z0000 Encounter for general adult medical examination without abnormal findings: Secondary | ICD-10-CM | POA: Insufficient documentation

## 2010-12-13 ENCOUNTER — Ambulatory Visit (INDEPENDENT_AMBULATORY_CARE_PROVIDER_SITE_OTHER): Payer: Medicare Other | Admitting: Internal Medicine

## 2010-12-13 ENCOUNTER — Encounter: Payer: Self-pay | Admitting: Internal Medicine

## 2010-12-13 DIAGNOSIS — M5430 Sciatica, unspecified side: Secondary | ICD-10-CM

## 2010-12-13 DIAGNOSIS — M899 Disorder of bone, unspecified: Secondary | ICD-10-CM

## 2010-12-13 DIAGNOSIS — G8929 Other chronic pain: Secondary | ICD-10-CM

## 2010-12-13 DIAGNOSIS — I10 Essential (primary) hypertension: Secondary | ICD-10-CM

## 2010-12-13 DIAGNOSIS — M542 Cervicalgia: Secondary | ICD-10-CM

## 2010-12-13 DIAGNOSIS — M949 Disorder of cartilage, unspecified: Secondary | ICD-10-CM

## 2010-12-13 DIAGNOSIS — M8580 Other specified disorders of bone density and structure, unspecified site: Secondary | ICD-10-CM

## 2010-12-13 HISTORY — DX: Sciatica, unspecified side: M54.30

## 2010-12-13 HISTORY — DX: Other chronic pain: G89.29

## 2010-12-13 MED ORDER — COLCHICINE 0.6 MG PO TABS: 0.6000 mg | ORAL_TABLET | Freq: Every day | ORAL | Status: DC

## 2010-12-13 MED ORDER — DULOXETINE HCL 60 MG PO CPEP: 60.0000 mg | ORAL_CAPSULE | Freq: Every day | ORAL | Status: DC

## 2010-12-13 MED ORDER — HYDROCODONE-ACETAMINOPHEN 7.5-325 MG PO TABS: 1.0000 | ORAL_TABLET | Freq: Four times a day (QID) | ORAL | Status: DC | PRN

## 2010-12-13 MED ORDER — LEVOTHYROXINE SODIUM 125 MCG PO TABS: 125.0000 ug | ORAL_TABLET | Freq: Every day | ORAL | Status: DC

## 2010-12-13 MED ORDER — ATORVASTATIN CALCIUM 40 MG PO TABS: 40.0000 mg | ORAL_TABLET | Freq: Every day | ORAL | Status: DC

## 2010-12-13 MED ORDER — PREGABALIN 50 MG PO CAPS: 50.0000 mg | ORAL_CAPSULE | Freq: Every day | ORAL | Status: DC

## 2010-12-13 NOTE — Assessment & Plan Note (Signed)
D/w pt, and reviewed dxa from oct 2010;  Has osteopenia but not severe and she declines the boniva at this time; for f/u dxa later this yr

## 2010-12-13 NOTE — Assessment & Plan Note (Signed)
Has rx from ortho - dr duda for tens unit,  I suggested [facility] on [address], Continue all other medications as before,  to f/u any worsening symptoms or concerns

## 2010-12-13 NOTE — Progress Notes (Signed)
Subjective:    Patient ID: [name] [name], female    DOB: [birth year], 70 y.o.   MRN: [id number]  HPI  Overall doing ok;  Has seen Dr Marjorie Smolder with  Neg stress test, sees Renal - last visit feb 2012 but not sure when next visit is;  Also sees Dr Sharl Ma for Endo now after Dr Dagoberto Ligas retired.  Pt denies chest pain, increased sob or doe, wheezing, orthopnea, PND, increased LE swelling, palpitations, dizziness or syncope.  Pt denies new neurological symptoms such as new headache, or facial or extremity weakness or numbness   Pt denies polydipsia, polyuria,.  Pt states overall good compliance with meds, trying to follow lower cholesterol, diabetic diet, wt overall stable.  Needs several meds for pt assist programs .  Mother with osteoporosis and pt is wondering about boniva after saw ad in  [employer] . Recent BP with home PT per orhto normal. Past Medical History  Diagnosis Date  . HYPOTHYROIDISM 02/17/2007  . DIABETES MELLITUS, TYPE II 02/17/2007  . HYPERLIPIDEMIA 02/17/2007  . GOUT 05/29/2007  . ANEMIA-NOS 05/29/2007  . ANXIETY 03/23/2010  . 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 DEPRESSION 02/17/2007  . RESTLESS LEG SYNDROME 05/29/2007  . COMMON MIGRAINE 05/29/2007  . PERIPHERAL NEUROPATHY 05/29/2007  . HYPERTENSION 02/17/2007  . PERIPHERAL VASCULAR DISEASE 02/17/2007  . ASTHMATIC BRONCHITIS, ACUTE 10/08/2008  . COPD 05/29/2007  . GERD 02/17/2007  . PEPTIC ULCER DISEASE 05/29/2007  . GASTROENTERITIS 12/02/2007  . RENAL INSUFFICIENCY 02/17/2007  . MENOPAUSAL DISORDER 05/25/2009  . Osteoarth NOS-L/Leg 03/11/2008  . SHOULDER PAIN, LEFT, CHRONIC 09/15/2008  . CERVICAL RADICULOPATHY, LEFT 05/27/2008  . BACK PAIN 12/12/2009  . OSTEOPENIA 09/22/2009  . SEIZURE DISORDER 02/17/2007  . HYPERSOMNIA 05/26/2010  . Memory loss 01/24/2010  . Abnormality of gait 07/30/2007  . ABDOMINAL PAIN -GENERALIZED 11/16/2009  . Personal history of colonic polyps 11/16/2009  . PERSISTENT DISORDER INITIATING/MAINTAINING SLEEP 08/22/2010  .  INADEQUATE SLEEP HYGIENE 08/22/2010  . DYSPNEA 08/22/2010  . Wheezing 08/15/2010  . CHEST PAIN 08/15/2010  . CHRONIC OBSTRUCTIVE PULMONARY DISEASE, ACUTE EXACERBATION 09/08/2010  . Palpitations 09/08/2010  . Other diseases of vocal cords 09/19/2010  . HEART MURMUR, HX OF 10/02/2010  . 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 Chronic neck pain 12/13/2010  . Osteopenia 12/13/2010   Past Surgical History  Procedure Date  . Left toe amputated 2006  . Bunionectomy 1980  . Goiter removal 1997  . Stress cardiolite 06/18/2006  . Tranthoracic echocardiogram 06/18/2006  . Electrocardiogram 05/29/2006  . Cholecystectomy   . Rotator cuff repair Left    Dr. Lajoyce Corners    reports that she has been smoking.  She does not have any smokeless tobacco history on file. She reports that she does not drink alcohol or use illicit drugs. family history includes Coronary artery disease in her other; Dementia in her mother; Hyperlipidemia in her other; Hypertension in her other; Ovarian cancer in her other; and Stroke in her other. Allergies  Allergen Reactions  . Nsaids     REACTION: renal dysfunction  . Pioglitazone     REACTION: EDEMA   Current Outpatient Prescriptions on File Prior to Visit  Medication Sig Dispense Refill  . allopurinol (ZYLOPRIM) 100 MG tablet Take 100 mg by mouth daily.        Marland Kitchen atorvastatin (LIPITOR) 40 MG tablet Take 40 mg by mouth daily.        . chlorpheniramine-hydrocodone (TUSSIONEX PENNKINETIC ER) 10-8 MG/5ML LQCR Take 5 mLs by mouth. 1 tsp. By mouth two  times a day as needed for cough       . clonazePAM (KLONOPIN) 2 MG tablet TAKE ONE TABLET BY MOUTH TWICE DAILY AS NEEDED  60 tablet  1  . cyclobenzaprine (FLEXERIL) 5 MG tablet Take 5 mg by mouth 3 (three) times daily as needed.        . furosemide (LASIX) 80 MG tablet Take 80 mg by mouth 3 (three) times daily.        Marland Kitchen glimepiride (AMARYL) 1 MG tablet Take 1 mg by mouth every morning before breakfast. 1 po every day       . hydrOXYzine (ATARAX) 25 MG  tablet Take 25 mg by mouth every 6 (six) hours as needed.        Marland Kitchen levothyroxine (SYNTHROID, LEVOTHROID) 125 MCG tablet Take 125 mcg by mouth daily.        . promethazine (PHENERGAN) 25 MG tablet Take 25 mg by mouth every 6 (six) hours as needed.        Marland Kitchen QUEtiapine (SEROQUEL) 100 MG tablet Take 100 mg by mouth at bedtime.        . sitaGLIPtan (JANUVIA) 100 MG tablet Take 100 mg by mouth daily. 1/4 by mouth once daily       . tiotropium (SPIRIVA) 18 MCG inhalation capsule Place 18 mcg into inhaler and inhale daily.        . traZODone (DESYREL) 100 MG tablet Take 100 mg by mouth at bedtime. 1  By mouth at bedtime as needed (when does not have seroquel)       . DISCONTD: colchicine 0.6 MG tablet Take 0.6 mg by mouth daily.        Marland Kitchen DISCONTD: DULoxetine (CYMBALTA) 60 MG capsule Take 60 mg by mouth daily.        Marland Kitchen DISCONTD: HYDROcodone-acetaminophen (NORCO) 7.5-325 MG per tablet Take 1 tablet by mouth. 1 by mouth four times per day as needed for pain       . DISCONTD: pregabalin (LYRICA) 50 MG capsule Take 50 mg by mouth. 1 po by mouth at bedtime       . DISCONTD: albuterol (PROAIR HFA) 108 (90 BASE) MCG/ACT inhaler Inhale into the lungs every 4 (four) hours as needed.        Marland Kitchen DISCONTD: pantoprazole (PROTONIX) 40 MG tablet Take 40 mg by mouth daily.         Review of Systems All otherwise neg per pt     Objective:   Physical Exam BP 142/92  Pulse 90  Temp(Src) 98.8 F (37.1 C) (Oral)  Ht 5\' 7"  (1.702 m)  Wt 177 lb 6 oz (80.457 kg)  BMI 27.78 kg/m2  SpO2 97% Physical Exam  VS noted Constitutional: Pt appears well-developed and well-nourished.  HENT: Head: Normocephalic.  Right Ear: External ear normal.  Left Ear: External ear normal.  Eyes: Conjunctivae and EOM are normal. Pupils are equal, round, and reactive to light.  Neck: Normal range of motion. Neck supple.  Cardiovascular: Normal rate and regular rhythm.   Pulmonary/Chest: Effort normal and breath sounds normal.  Abd:  Soft,  NT, non-distended, + BS Neurological: Pt is alert. No cranial nerve deficit.  Skin: Skin is warm. No erythema.  Psychiatric: Pt behavior is normal. Thought content normal.        Assessment & Plan:

## 2010-12-13 NOTE — Patient Instructions (Addendum)
Please consider [facility] on [address] for the TENS unit Continue all other medications as before We will fill out the paperwork for the Patient Assist Programs - for synthroid, lipitor, and lyrica Please return in 6 months, or sooner if needed

## 2010-12-13 NOTE — Assessment & Plan Note (Signed)
stable overall by hx and exam, most recent lab reviewed with pt, and pt to continue medical treatment as before, for norco med refill today

## 2010-12-13 NOTE — Assessment & Plan Note (Signed)
stable overall by hx and exam, most recent lab reviewed with pt, and pt to continue medical treatment as before  BP Readings from Last 3 Encounters:  12/13/10 142/92  10/03/10 130/80  09/19/10 130/60

## 2010-12-19 NOTE — Discharge Summary (Signed)
NAMEJOHNATHAN, TORTORELLI                 ACCOUNT NO.:  [id number]   MEDICAL RECORD NO.:  [id number]          PATIENT TYPE:  INP   LOCATION:  5036                         FACILITY:  [facility]   PHYSICIAN:  Nadara Mustard, MD     DATE OF BIRTH:  [birth year]   DATE OF ADMISSION:  01/23/2007  DATE OF DISCHARGE:  01/28/2007                               DISCHARGE SUMMARY   FINAL DIAGNOSIS:  Osteomyelitis and abscess left great toe.   PROCEDURE:  Left first ray amputation.  Discharged to home in stable  condition with home health physical therapy.   HISTORY OF PRESENT ILLNESS:  The patient is a 70 year old woman with  diabetic insensate neuropathy with osteomyelitis and abscess left great  toe.  She had failed conservative care and presents at this time for  left first ray amputation.  The patient's hospital course was  essentially unremarkable.  She underwent a left first ray amputation on  June 19.  The patient was started on physical therapy on June 20 with  PT, progressive ambulation, touchdown weightbearing on the left.  The  patient was set up for rehab versus home health physical therapy.  The  patient progressed well with physical therapy and was felt to be safe  for discharge to home and the patient was discharged to home with  durable medical equipment as well as home health physical therapy with  [facility] on June 24 with follow-up in the office in 2 weeks.      Nadara Mustard, MD  Electronically Signed     MVD/MEDQ  D:  03/06/2007  T:  03/06/2007  Job:  [id number]

## 2010-12-19 NOTE — Discharge Summary (Signed)
[name] [name], KOHLS                 ACCOUNT NO.:  [id number]   MEDICAL RECORD NO.:  [id number]          PATIENT TYPE:  INP   LOCATION:  5714                         FACILITY:  [facility]   PHYSICIAN:  Gordy Savers, MDDATE OF BIRTH:  08/08/1940   DATE OF ADMISSION:  02/26/2007  DATE OF DISCHARGE:  03/02/2007                               DISCHARGE SUMMARY   FINAL DIAGNOSIS:  Pneumonia.   ADDITIONAL DIAGNOSES:  1. Chronic kidney disease.  2. Diabetes.  3. Left foot wound.  4. Peripheral vascular disease status post amputation second toe left      foot.  5. Gastroesophageal reflux disease.  6. COPD.  7. Hypercholesterolemia.  8. Hypertension.  9. Depression.   DISCHARGE MEDICATIONS:  1. Aspirin 81 mg daily.  2. Requip 0.25 mg b.i.d.  3. Lyrica 50 mg t.i.d.  4. Lasix 40 mg b.i.d.  5. Colchicine 0.6 daily.  6. Clonazepam 1 mg t.i.d. p.r.n.  7. Oxycodone 50 mg every 6 hours p.r.n.  8. Lexapro 10 mg daily.  9. Synthroid 0.15 mg daily.  10.Effexor XR 75 b.i.d.  11.Lipitor 40 mg daily.  12.Protonix 40 mg daily.  13.Robaxin 500 mg daily.  14.Seroquel 25 mg at bedtime.  15.Allopurinol 100 mg daily.   HISTORY OF PRESENT ILLNESS:  The patient is a 70 year old African  American female with multiple medical problems who presented to the  office with a 4-week history of worsening shortness of breath.  This was  associated with some gagging and coughing and swallowing difficulty.  She was seen in the office where O2 saturations were in the 80-84%  range.  The patient was admitted with presumptive aspiration pneumonia.   LABORATORY DATA/HOSPITAL COURSE:  The patient was admitted to the  hospital where she was treated with Zosyn for left lower lobe pneumonia.  As ventilation-perfusion lung scan was a low likelihood for pulmonary  embolism, a swallowing study was performed during the hospital.  Serial  chest x-rays revealed aeration in both bases and a left lower lobe  infiltrate.   During the hospital, the patient was treated with intensive  pulmonary toilet.  She was maintained on multiple preadmission  medications.  Studies included a urine culture that was positive for  enterococcus species sensitive to ampicillin and levofloxacin.  White  count was normal at 6.3, H&H 12.9, 39.2.  BUN and creatinine 48 and  3.61, respectively.  Hemoglobin A1c 6.4.  Initially in the hospital, the  patient was treated with supplemental oxygen therapy.  Time of  discharge, O2 saturation 95% on room air.  Serial blood sugars were  monitored carefully and the patient was covered with sliding scale  regimen of short-acting insulin.  Dr. Lajoyce Corners followed the patient during  the hospital..  Bilateral lower extremity Doppler studies were performed  to rule out DVT.  These were negative.  During the hospital, her  glycemic control was reasonable on a moderate sensitive sliding scale  insulin NovoLog regimen.   DISPOSITION:  The patient was discharged today with office follow-up  within the next 2-3 days.  She will complete additional 5  days of  Levaquin 500 mg daily.  Additionally, she will be discharged on  albuterol 2 puffs with spacer three times daily.  Along with Spiriva one  daily.  She has also been asked to follow with Dr. Lajoyce Corners in approximately  2 weeks.   CONDITION ON DISCHARGE:  Stable.      Gordy Savers, MD  Electronically Signed     PFK/MEDQ  D:  03/02/2007  T:  03/03/2007  Job:  [id number]

## 2010-12-19 NOTE — H&P (Signed)
[name] [name], [name] [name]                 ACCOUNT NO.:  [id number]   MEDICAL RECORD NO.:  [id number]          PATIENT TYPE:  INP   LOCATION:  5036                         FACILITY:  [facility]   PHYSICIAN:  [name] Levins, MD      DATE OF BIRTH:  [birth year]   DATE OF ADMISSION:  01/23/2007  DATE OF DISCHARGE:  01/28/2007                              HISTORY & PHYSICAL   CHIEF COMPLAINT:  Worsening shortness of breath for 4 weeks.   HISTORY OF PRESENT ILLNESS:  [name] [name] is a 70 year old African American  female here with some confusion and what she says is about 4 weeks of  gradual worsening increasing shortness of breath.  It seemed to start  about the same time she has had some gagging and coughing associated  with difficulty swallowing.  She had a similar episode of difficulty  swallowing several years ago and saw a gastroenterologist but cannot  remember who.  She had somewhat similar symptoms, not as much, and no  EGD and dilation done.  She is now here in the office with O2 saturation  80% to 84% on room air at rest, now for admission with presumed  aspiration pneumonia.  Also of note is that she is status post recent  left total amputation January 23, 2007, per [name] [name] and was scheduled for  followup stitches on July 24.   PAST MEDICAL HISTORY:  1. 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  2. Degenerative joint disease of the knees, lumbar and left wrist.  3. Chronic low back pain due to spinal stenosis.  4. Depression.  5. Migraines.  6. Diabetes mellitus.  7. Hypertension.  8. Hypercholesterolemia.  9. Allergies.  10.Chronic obstructive pulmonary disease with chronic bronchitis.  11.Peptic ulcer disease.  12.Gastroesophageal reflux disease.  13.Status post thyroid surgery.  14.Peripheral vascular disease status post amputation of the 2nd toe      on the left foot October 2006 and amputation of another toe as well      just last month.  15.Chronic renal insufficiency with secondary volume overload in the      past.  16.Anemia, possibly secondary to chronic renal insufficiency.  17.History of recurrent cellulitis left foot.  18.History of lumbar disk disease.   ALLERGIES:  No known drug allergies.   CURRENT MEDICATIONS:  1. Aspirin 81 mg one p.o. daily.  2. Requip 0.25 mg b.i.d.  3. Lyrica 50 mg one t.i.d.  4. Lasix 80 mg b.i.d.  5. Colchicine 6 mg b.i.d.  6. Clonazepam 1 mg t.i.d. p.r.n.  7. Oxycodone 15 mg q.6 hours p.r.n.  8. Tramadol 50 mg 1-2 q.6 hours p.r.n.  9. Lexapro 10 mg daily.  10.Actos 45 mg p.o. daily.  11.Synthroid 0.15 mcg p.o. daily.  12.Effexor XR 75 mg b.i.d.  13.Lipitor 400 mg p.o. daily.  14.Protonix 40 mg p.o. daily.  15.Robaxin 500 mg p.o. daily.  16.Seroquel 25 mg 2 q.h.s.  17.Allopurinol 300 mg p.o. daily.   SOCIAL HISTORY:  Tobacco less than a half pack per day.  Alcohol none.  Disabled.  Single.  No children.   FAMILY HISTORY:  Significant for heart disease, diabetes, hypertension,  history of stroke, DJD.  Sister with ovary cancer.   REVIEW OF SYMPTOMS:  She sees a Museum/gallery conservator and does see also  a chronic pain specialist, as [name] [name] for endocrinology, [name] [name] in the past for orthopedics, [name] [name] for rheumatologic  concerns.  Otherwise noncontributory.   PHYSICAL EXAMINATION:  GENERAL:  [name] [name] is a 70 year old white  female, somewhat slowed mentally.  VITAL SIGNS:  Blood pressure 117/72, respirations 20, pulse 101,  temperature 98.2, weight 192.  ENT:  Sclera clear.  TM clear.  Pharynx benign.  Neck is without  lymphadenopathy, JVD, or thyromegaly.  CHEST:  Decreased breath sounds.  No obvious rales or wheezing.  CARDIAC:  Regular rate and rhythm, somewhat distant.  ABDOMEN:  Soft, nontender.  Positive bowel sounds.  EXTREMITIES:  With some slight warmth to the dorsum of the left foot,  and I did not remove the dressing at this time to further  examine.   LABORATORY:  O2 saturation 80% to 84% on room air at rest.   ASSESSMENT/PLAN:  1. Hypoxia with known COPD, presumed aspiration pneumonia.  She is to      be admitted and given O2 nebulizer treatment.  Zosyn IV, as well as      routine labs with chest x-ray and blood cultures.  2. Dysphagia with cough.  Will likely need GI consultation and speech      therapy evaluation.  3. Status post recent left foot and toe surgery.  Will need orthopedic      follow up for stitches out July 24.  4. Diabetes mellitus.  Check CBG, apply sliding scale insulin.      Otherwise, continue home medications.  5. Other medical problems otherwise as above.  Continue home      medications.  6. Disposition for home when improved.  7. Code status - full code.  8. Prophylaxis - continue the PPI as above and give Lovenox      empirically.      [name] Levins, MD  Electronically Signed     JWJ/MEDQ  D:  02/26/2007  T:  02/27/2007  Job:  [id number]   cc:   [name] [name], M.D.  [name] Frieze Jens Som, MD, [name] [name]  [name] [name], M.D.

## 2010-12-19 NOTE — Consult Note (Signed)
[name] [name], [name] [name]                 ACCOUNT NO.:  [id number]   MEDICAL RECORD NO.:  [id number]          PATIENT TYPE:  OIB   LOCATION:  2550                         FACILITY:  [facility]   PHYSICIAN:  Garnetta Buddy, M.D.   DATE OF BIRTH:  05-Oct-1940   DATE OF CONSULTATION:  DATE OF DISCHARGE:                                 CONSULTATION   This is a consult for known chronic kidney disease stage 4, diabetes  mellitus followed by Dr. Lowell Guitar, admission for left toe amputation by  Dr. Lajoyce Corners.   PAST MEDICAL HISTORY:  1. Chronic kidney disease stage 4.  2. Hypothyroidism status post thyroidectomy.  3. History of vocal cord palsy.  4. History of DJD.  5. History of gout.  6. History of depression.  7. History of diabetes mellitus type 2.  8. History of left toe amputation October of 2006.  9. History of peripheral neuropathy.  10.History of restless legs.  11.History of gastroesophageal reflux disease.  12.History of chronic lower back pain.   ALLERGIES:  No known drug allergies.   REVIEW OF SYSTEMS:  GENERAL:  Denies fatigue, fevers, sweats, chills.  EYES:  Denies visual complaints, blurred vision, no history of  retinopathy, history of cataract extraction.  EARS/NOSE/MOUTH/THROAT:  No hearing loss, epistaxis, sore throat.  CARDIOVASCULAR:  Denies  anginal chest past, orthopnea, no ankle or leg swelling, no shortness of  breath.  RESPIRATORY SYSTEM:  Denies cough, wheezing, hemoptysis.  ABDOMINAL SYSTEM:  No abdominal pain, nausea, vomiting, history of  gastroesophageal reflux disease, no history of abdominal surgery, no  history of change in bowel habit or blood noted in the stools.  UROGENITAL:  No urgency, frequency or dysuria, no history of frothy  foaminess in urine, no history of hematuria.  MUSCULOSKELETAL:  Denies  use of nonsteroidal antiinflammatory drugs, a previous history of acute-  on-chronic renal insufficiency secondary to nonsteroidal  antiinflammatory drug use.  No  history of COX-2 inhibitors.  History of  chronic back pain using muscle relaxants as well as narcotics.  She does  have a history of gout.  ENDOCRINE:  History of diabetes mellitus type  2, history of hypothyroidism following thyroidectomy.  NEUROLOGIC:  No  history of stroke, seizures, no diplopia, dysarthria, dysphagia, no  dysphonia, no unilateral weakness, no paresthesias.   SOCIAL HISTORY:  She lives alone, history of vocal cord palsy following  thyroidectomy.  History of tobacco abuse half-pack per day.  History of  alcohol use.   FAMILY HISTORY:  Noncontributory.   PHYSICAL EXAMINATION:  GENERAL:  Alert and pleasant lady in non-  distress.  VITAL SIGNS:  Blood pressure 125/70, pulse 75, temperature afebrile.  HEAD:  Normocephalic, atraumatic.  Pupils round, equal and reactive.  EARS/NOSE/MOUTH/THROAT:  TMs appears normal.  Nares clear.  Oropharynx  clear.  NECK:  Supple.  No thyromegaly.  No adenopathy.  No JVP.  CARDIOVASCULAR:  Regular rate and rhythm.  RESPIRATORY:  Lungs fields were clear to auscultation.  No wheezes or  rales.  ABDOMEN:  Soft, nontender.  Bowel sounds present.  EXTREMITIES:  Left extremity, right  dorsalis pedis pulse 2+.   LABS:  Sodium 135, potassium 3.5, chloride 97, CO2 25, BUN 82,  creatinine 3.6, glucose 126, calcium  9.3, WBC 7.3, hemoglobin 13,  platelets 256.   ASSESSMENT/PLAN:  1. Chronic kidney disease, stable, stage 4, [employer], creatinine 3.0, GFR less than 20 mL a minute at      baseline.  2. Diabetes mellitus.  Accu-Cheks a.c. and h.s.  Renally adjust      Januvia to 25 mg.  No insulin therapy.  3. Status post amputation followed by Dr. Lajoyce Corners.  4. Hypertension, volume controlled.  5. Renally adjust medications for appropriate dosing.      Garnetta Buddy, M.D.  Electronically Signed     MWW/MEDQ  D:  01/23/2007  T:  01/23/2007  Job:  [id number]

## 2010-12-22 NOTE — Assessment & Plan Note (Signed)
[facility]                              CARDIOLOGY OFFICE NOTE   NAME:Duerst, ANSHU WEHNER                        MRN:          [id number]  DATE:06/03/2006                            DOB:          1940-12-01    Ms. [name] [name] returns for follow up today. Please refer to my note of May 29, 2006 for details. Since we increased her Lasix, she continues to have  some dyspnea as well as tightness in her chest that has worsened with  inspiration. He pedal edema has mildly decreased. She otherwise has not had  chest pain, palpitations or syncope. Her medications are unchanged with the  exception that her Lasix is now 240 mg p.o. b.i.d. (she was seen by Dr.  Lowell Guitar today in nephrology, and he increased her Lasix further).   Her physical exam today shows a blood pressure of 102/66 and her pulse is  64.  Her chest is clear.  Her cardiovascular exam reveals a regular rate and rhythm.  Her extremities show 1 to 2+ edema.   DIAGNOSES:  1. Volume overload/pedal edema.  2. Dyspnea.  3. Atypical chest pain.  4. Diabetes mellitus.  5. Hyperlipidemia.  6. Renal insufficiency.   PLAN:  [name]' volume status appears to be mildly improved since  increasing her Lasix. She was seen by Dr. Lowell Guitar today, and her Lasix was  increased further to 240 mg p.o. b.i.d. Note, when we saw her previously, we  did check laboratories including BUN and creatinine which were elevated at  49 and 3.0. Her BNP was only 201. I think it is most likely that her edema  and shortness of breath are related to volume overload due to her worsening  renal function. However, she is scheduled to have an echocardiogram to  quantify her left ventricular function and an adenosine Myoview on Monday of  next week. I will see her back in 2 to 4 weeks for further evaluation. She  will continue on her aspirin and statin. I would be hesitant to proceed with  a cardiac catheterization on her as risk of  contrast nephropathy would be  extremely high. Certainly, if she is initiated on dialysis, then this can be  reconsidered.    ______________________________  Madolyn Frieze Jens Som, MD, [facility]    BSC/MedQ  DD: 06/03/2006  DT: 06/04/2006  Job #: [id number]

## 2010-12-22 NOTE — Op Note (Signed)
NAME:  [name] [name], [name] [name]                           ACCOUNT NO.:  [id number]   MEDICAL RECORD NO.:  [id number]                   PATIENT TYPE:  AMB   LOCATION:  ENDO                                 FACILITY:  [facility]   PHYSICIAN:  Georgiana Spinner, M.D.                 DATE OF BIRTH:  11-22-1940   DATE OF PROCEDURE:  01/18/2004  DATE OF DISCHARGE:                                 OPERATIVE REPORT   PROCEDURE PERFORMED:  Upper endoscopy.   ENDOSCOPIST:  Georgiana Spinner, M.D.   INDICATIONS FOR PROCEDURE:  Gastroesophageal reflux disease.   ANESTHESIA:  Demerol 100 mg, Versed 10 mg, Phenergan 25 mg.   DESCRIPTION OF PROCEDURE:  With the patient mildly sedated in the left  lateral decubitus position, the Olympus video endoscope was inserted in the  mouth and passed under direct vision through the esophagus which appeared  normal.  There was no evidence of Barrett's esophagus or esophagitis.  We  entered into the stomach.  The fundus, body, antrum, duodenal bulb and  second portion of the duodenum all appeared normal.  From this point, the  endoscope was slowly withdrawn taking circumferential views of the entire  duodenal mucosa until the endoscope was pulled back into the stomach and  placed on retroflexion to view the stomach from below.  The endoscope was  then straightened and withdrawn taking circumferential views of the  remaining gastric and esophageal mucosa.  The patient's vital signs and  pulse oximeter remained stable.  The patient tolerated the procedure well  without apparent complications.   FINDINGS:  Unremarkable examination.   PLAN:  Proceed to colonoscopy.                                               Georgiana Spinner, M.D.    GMO/MEDQ  D:  01/18/2004  T:  01/18/2004  Job:  [id number]   cc:   Loraine Leriche T. Nile Riggs, M.D.  Fax: [phone]

## 2010-12-22 NOTE — Consult Note (Signed)
NAME:  [name] [name]                             ACCOUNT NO.:  [id number]   MEDICAL RECORD NO.:  [id number]                    PATIENT TYPE:  REC   LOCATION:                                       FACILITY:   PHYSICIAN:  Zachary George, DO                      DATE OF BIRTH:  January 25, 1941   DATE OF CONSULTATION:  09/25/2002  DATE OF DISCHARGE:                                   CONSULTATION   [facility]   HISTORY:  [name] [name] returns to the clinic today for reevaluation.  She was  initially seen on 08/18/02.  She continues to complain of low back pain  radiating radiating to bilateral lower extremities which is improved to some  degree with Norco 5 mg three times per day.  She also complains of right  greater than left knee pain which is not really relieved with the Norco.  Her pain today is 9/10 on subjective scale.  She has been unable to get into  aquatic therapy secondary to inclement weather.  I reviewed Health and  History Form and 14-Point Review of Systems.  Her function and quality of  life indices remain somewhat declined.  Most of her pain today involves her  right knee, but she states her back pain and lower extremity pain is a close  second.   PHYSICAL EXAMINATION:  GENERAL:  An obese female in no acute distress.  VITAL SIGNS:  Blood pressure 157/73, pulse 117, respirations 20, O2  saturation 92% on room air.  NEUROLOGIC:  No new neurologic findings on lower extremity including motor,  sensory and reflexes.  There is no heat, erythema or edema in the lower  extremities.  There is minimal effusion in the right knee with minimal  tenderness to palpation.  Negative provocative maneuvers including Lachman,  anterior drawer, posterior drawer and McMurray's.  There is no medial or  lateral instability.  No patellar ballottement.  BACK:  Increased lumbar lordosis with tenderness to palpation bilateral  lumbar paraspinal muscles.  There is no increase in  pain with flexion or  extension.   IMPRESSIONS:  1. Right greater than left knee pain, chronic.  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  2. Chronic low back pain with degenerative disk disease of the lumbar spine,     spondylolisthesis and spinal stenosis with bilateral lower extremity     radicular symptoms.   PLAN:  1. Discussed treatment options with [name] [name].  It is reasonable to proceed     with right knee injection.  [name] [name] states that she has had steroid     injections into her knee a few years ago which improved it significantly.     Most recently, she underwent injection by [name] [name], M.D. which  described as a different type of knee injection.  This may have been     Synvisc without relief.  She wishes to proceed with knee injection.  2. Will recommend a lumbar epidural steroid injection.  She has had these     remotely with improvement in her back and lower extremity pain.  She     wishes to proceed with this as well and will schedule this in two to     three weeks.  3. Continue Norco 5 mg/325 mg 1 p.o. t.i.d. as needed.  4. Await aquatic therapy.  5. Patient to return to clinic in two to three weeks for lumbar epidural     steroid injection.   PROCEDURE:  Right knee steroid injection.   The procedure is described to patient in detail including risks, benefits,  limitations and alternatives.  Risks include, but not limited to bleeding,  infection, failure to relieve pain, increase pain, surgical reaction to  medications.  The patient wishes to proceed.   Skin was prepped in the usual fashion with Betadine and alcohol swabs.  The  right knee was injected with 1 cc of Kenalog 40 mg/cc plus 3 cc of 1%  lidocaine using a 25 gauge 1.5 inch needle.  There were no complications.  The patient tolerated the procedure well.  Discharge instructions were  given.  She was released in stable condition.   The patient  was educated in above findings and recommendations and  understands.  There were no barriers to communication.                                               Zachary George, DO    JW/MEDQ  D:  09/25/2002  T:  09/26/2002  Job:  [id number]   cc:   Osvaldo Shipper. Spruill, M.D.  [address] [address]  [place]  [place] [address]  Fax: [phone]

## 2010-12-22 NOTE — Assessment & Plan Note (Signed)
[facility]                                   ON-CALL NOTE   NAME:[name], [name]                          MRN:          [id number]  DATE:05/26/2006                            DOB:          10/26/1940    PRIMARY CARE PHYSICIAN:  Dr. Jonny Ruiz.   Phone # [phone].   SUBJECTIVE:  One week of worsening peripheral edema which has gotten even  worse over the past 3 days.  She saw her kidney doctor midway through last  week.  She sees her for kidney problems and recently noted diabetes.  The  kidney doctor encouraged her to increase from her usual 80 mg p.o. b.i.d. of  Lasix up to 80 mg p.o. t.i.d.  She has been taking the increased dose of  Lasix for 3 days but none of this swelling is going down and she is  gradually having more and more shortness of breath.  She also did try some  Demadex which did not help.   ASSESSMENT AND PLAN:  Peripheral edema concerning for worsening kidney  function and possible pulmonary edema:  I discussed options with the patient  in detail.  It seems that her GI tract is not absorbing the oral Lasix  likely secondary to GI edema.  Given this, she most likely needs IV Lasix to  decrease her fluid overload.  She also stated she is not urinating as much  as she had been previously.  Given these concerns plus her shortness of  breath, I encouraged her to go to the emergency room.  She was concerned  that she would miss appointments that she had scheduled earlier in the week,  but again I encouraged her to go to the emergency room.       Kerby Nora, MD      AB/MedQ  DD:  05/26/2006  DT:  05/27/2006  Job #:  [id number]   cc:   Corwin Levins, MD

## 2010-12-22 NOTE — Consult Note (Signed)
NAME:  [name] [name], [name] [name]                           ACCOUNT NO.:  [id number]   MEDICAL RECORD NO.:  [id number]                   PATIENT TYPE:  REC   LOCATION:  [facility]                                  FACILITY:  [facility]   PHYSICIAN:  Zachary George, DO                      DATE OF BIRTH:  01-30-1941   DATE OF CONSULTATION:  10/09/2002  DATE OF DISCHARGE:                                   CONSULTATION   HISTORY OF PRESENT ILLNESS:  The patient returns to the clinic today for  reevaluation and lumbar epidural steroid injection for spinal stenosis of  the lumbar spine with neurogenic claudication.  She continues to complain of  lower back pain which radiates into her bilateral lower extremities with  walking and which resolves with rest.  She has central canal stenosis at L3-  4 with grade 1 anterolisthesis of L3 on L4.  She has had lumbar epidural  steroid injections approximately three years ago with improvement in her  lower back pain and lower extremity radicular symptoms.  In addition, she  continues to complain of right knee pain.  She underwent a knee injection at  last visit on September 25, 2002, which gave her complete pain relief for  approximately one day, but then her medial knee pain started to return, and  she states that her medial knee pain is worse now, but she denies any  lateral knee pain at this time.  Walking up and down stairs aggravates her  right knee pain.  She continues to take Norco 5 mg two times per day, which  she states helps.  She has not gotten into aquatic therapy, stating that she  was waiting to have these injections performed.  I encouraged her to start  aquatic therapy.  I think this will help her knee pain as well as her back  pain.  Her pain today is a 10/10 on a subjective scale.  I reviewed the  health and history form and 14-point review of systems.   PHYSICAL EXAMINATION:  GENERAL:  Obese female in no acute distress.  VITAL SIGNS:  Blood pressure 154/81,  pulse 87, respirations 20, O2  saturation 97% on room air.  BACK:  Examination of the back reveals slightly lowered left hemipelvis with  tenderness to palpation right lumbar paraspinous muscles.  EXTREMITIES:  Examination of the knees does not reveal any heat, erythema,  or edema.  Right knee examination is negative for anterior drawer, posterior  drawer, Lachman's and McMurray's.  There is no medial or lateral  instability.  There is tenderness to palpation over the medial joint line.  NEUROLOGIC:  No new neurologic findings in the lower extremities including  motor, sensory, and reflexes.   IMPRESSION:  1. 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  2. Chronic low back pain.  3. Degenerative disk disease  of the lumbar spine.  4. Spondylolisthesis L3 on L4.  5. Right knee pain, chronic, osteoarthritis.   PLAN:  1. Discussed further treatment options with the patient.  We will proceed     with a lumbar epidural steroid injection today to help decrease her lower     back pain and lower extremity radicular symptoms from neurogenic     claudication.  2. Await physical therapy.  3. Consider repeat right knee injection.  4. Continue Norco 5 mg/325 mg 1 p.o. three times daily as needed, #60     without refills.  5. Patient to return to clinic in two weeks for reevaluation.  Will consider     repeat lumbar epidural steroid injection as predicated upon patient's     response and symptoms.  We will also consider repeat right knee     injection.   PROCEDURE:  Lumbar epidural steroid injection:  The procedure was described  to the patient in detail including risks, benefits, limitations,  alternatives, and potential side effects.  Risks include but are not limited  to bleeding, infection, spinal headache, nerve injury, paralysis, increased  pain, failure to relieve pain, allergic reaction to medications.  The  patient understands and wishes to proceed.  Informed  consent was obtained.  The patient was brought back to the fluoroscopy suite and placed on the  table in prone position.  The skin was prepped and draped in the usual  sterile fashion.  The skin and subcutaneous tissues were anesthetized with 3  mL of preservative-free 1% lidocaine.  Under direct fluoroscopic guidance an  18-gauge, 3-1/2-inch Hustead needle was advanced into the right paramedian  L5-S1 epidural space with loss-of-resistance technique.  There were no CSF  hemoparesthesias noted.  This was then followed by injection of 1 mL of  Kenalog 40 mg/mL plus 3 mL of normal saline with needle flush.  There were  no complications.  The patient tolerated the procedure well.  Discharge  instructions were given.  The patient was monitored and released in stable  condition.   The patient was educated on the above findings and recommendations and  understands.  There were no barriers to communication.                                               Zachary George, DO    JW/MEDQ  D:  10/09/2002  T:  10/09/2002  Job:  [id number]   cc:   Osvaldo Shipper. Spruill, M.D.  [address] [address]  [place]  [place] [address]  Fax: [phone]

## 2010-12-22 NOTE — Consult Note (Signed)
[facility]  Patient:    DOSSIE, OCANAS                        MRN: [id number] Proc. Date: 09/10/00 Adm. Date:  [id number] Attending:  Nadara Mustard CC:         Osvaldo Shipper. Spruill, M.D.   Consultation Report  HISTORY OF PRESENT ILLNESS:  Patient is a 70 year old woman with type 2 diabetes, orally controlled, who presents for evaluation of both lower extremities.  PAST MEDICAL HISTORY:  Significant for type 2 diabetes, hypertension, history of migraines, high cholesterol, arthritis, gout, depression, chronic bronchitis, chronic back pain.  PAST SURGICAL HISTORY:  Significant surgeries include bunionectomy x 2, cholecystectomy and goiter surgery.  ALLERGIES:  No known drug allergies.  MEDICATIONS:  Amaryl, Prevacid, Tylenol No. 3, Paxil, Glucophage, trazodone, clonazepam, Ambien, ______ , DynaCirc CR, Norvasc, Zocor, Celebrex, albuterol, Flonase, Imitrex, Lasix, colchicine and Allegra.  PHYSICAL EXAMINATION  EXTREMITIES:  On examination of both lower extremities, she has no evidence of any venous stasis insufficiency.  She has good dorsalis pedis pulses, does have onychomycotic nails x 10 with thickening and discoloration of the nails, does have hypertrophic calluses, does not have protective sensation and cannot consistently feel a 5.07 Semmes-Weinstein monofilament.  She does have decreased hair growth, skin texture changes and clawing of the toes.  She is status post bilateral bunion surgeries with an infected left great toe secondary to initial surgery and revision surgery.  ASSESSMENT:  Diabetic insensate neuropathy with onychomycosis and clawing of the toes.  PLAN:  We will trim the onychomycotic nails x 10.  We will have her use Bag Balm daily for moisturizing for the calluses and will set her up with some extra-depth shoes and custom inserts.  Plan a followup in three months. DD:  09/24/00 TD:  09/25/00 Job: [id number]

## 2010-12-22 NOTE — Assessment & Plan Note (Signed)
[facility]   NAMEPAISLYN, DOMENICO                 ACCOUNT NO.:  [id number]   MEDICAL RECORD NO.:  [id number]      DATE OF BIRTH:  October 28, 1940   PHYSICIAN:  Theresia Majors. Tanda Rockers, M.D. VISIT DATE:  04/16/2006                                     OFFICE VISIT   SUBJECTIVE:  Ms. 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  We had  initially suspected that this wound originated in malfitting shoes and  treated her initially with Anasept gel, Kerlix and a compression wrap. In  the interim, we have received the results from her initial culture which has  shown methicillin-resistant Staphylococcus.  In the interim she denies  increased pain, fever or malodor.   VITAL SIGNS:  Blood pressure is 142/76, respirations 20, pulse rate 78.  She  is afebrile.   Capillary blood glucose is 119 mg%.  The dorsum of the left foot shows areas  of localized induration and inflammation.  The overlying desquamation was  debrided, disclosing full thickness ulceration.  A #10 blade was used to  debride to healthy bleeding tissue with hemorrhage controlled with direct  pressure.   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   MANAGEMENT PLAN & GOAL:  We started the patient on dicloxacillin 100 mg  twice daily for a 30 day course and also Septra DS 1 by mouth twice daily a  30 day course.  We will follow the patient weekly.  Wound dressings will be  performed daily.  The patient has been instructed to bathe her feet daily  with antiseptic soap and to wear a clean cotton sock.  She is to continue in  the healing sandal.  We will  re-evaluate her in one week p.r.n.           ______________________________  Theresia Majors. Tanda Rockers, M.D.     Cephus Slater  D:  04/16/2006  T:  04/17/2006  Job:  [id number]

## 2010-12-22 NOTE — Consult Note (Signed)
NAME:  [name] [name], [name] [name]                           ACCOUNT NO.:  [id number]   MEDICAL RECORD NO.:  [id number]                   PATIENT TYPE:  REC   LOCATION:  TPC                                  FACILITY:  [facility]   PHYSICIAN:  Zachary George, DO                      DATE OF BIRTH:  [birth year]   DATE OF CONSULTATION:  08/18/2002  DATE OF DISCHARGE:                                   CONSULTATION   Dear Dr. Shana Chute:   Thank you very much for kindly referring this patient to the center for pain  and rehabilitative medicine for evaluation.  The patient was evaluated in  the clinic today.  Please refer to the following for details regarding the  History and Physical examination and treatment plan.  Once again, thank you  for allowing [place] to participate in the care of this patient.   CHIEF COMPLAINT:  Knee pain, low back pain.   HISTORY OF PRESENT ILLNESS:  The patient is a pleasant 70 year old, left-  hand dominant female who was kindly referred by Dr. Shana Chute to evaluate for  bilateral knee pain.  The patient also complains of lower back pain.  In  regards to patient's knee pain, she states she has right greater than left  knee pain involving the knee caps.  She has had this for several years.  She admits to associated swelling in the left knee greater than right on  occasion.  She also admits to her knees giving out on her, right greater  than left.  She denies any catching, popping, or clicking but does admit to  increased pain after sitting for a prolonged period and then getting up to  walk.  She also has difficulty climbing stairs secondary to the pain.  She  apparently was followed by Dr. Chaney Malling, orthopedist, for her knee pain and  underwent some type of knee injections about six months ago which did not  seem to help her pain.  She also states she has had x-rays of her knees  which have revealed arthritis.  I do not have copies of these x-rays to  review.  She denies any temperature  changes or erythema about the knees.  In  terms of her lower back pain, she states that her pain in her low back is  worse with prolonged sitting and standing.  She has radiation of the pain  into her left posterior thigh on occasion.  She does intermittently get  numbness and paresthesias in her feet.  She has had an MRI of her lumbar  spine which reveals multi-level degenerative disk disease and spondylosis  with a grade I anterior lithiasis of L3 on L4 with bilateral neural  foraminal and central canal stenosis at L3-4.  She underwent lumbar epidural  steroid injections approximately three years ago with improvement in her  lower back and lower  extremity pain.  She has not had any physical therapy.  I reviewed the health and history form and 14-point Review of Systems.   The patient's pain level is a 10/10 on subjective scale.  She describes this  as constant, throbbing, sharp, stabbing, with associated weakness as noted.  Symptoms again are worse with walking, bending, and working.  Her back pain  is worse with prolonged sitting, standing.  Symptoms are improved with  medications including Tylox which she states gives her some relief of her  knee pain but does not really help her lower back pain.  She has taken  nonsteroidal anti-inflammatory medications in the past; however, there is  concern in this regard secondary to some renal insufficiency, and these were  discontinued.   REVIEW OF SYSTEMS:  The patient describes unexplained weight gain,  unexplained fatigue, blurring vision, cataracts, sinus trouble, heart  murmurs, ankle swelling, history of rheumatic fever, wheezing, shortness of  breath on occasion, some nausea intermittently, arthritis, back pain, rash  on occasion, headaches, excessive worry, depression, nervous disorder,  memory loss, thyroid trouble, excessive fluid intake.   PAST MEDICAL HISTORY:  1. Diabetes mellitus.  2. Renal insufficiency.  3. Hypertension.  4.  Gout.  5. Hypothyroidism.   PAST SURGICAL HISTORY:  1. Cholecystectomy.  2. Thyroid resection.  3. Bunionectomy.   FAMILY HISTORY:  Hypertension.   SOCIAL HISTORY:  The patient smokes one-half pack of cigarettes per day, and  I counseled her on the importance of smoking cessation in terms of pain and  overall health.  She denies alcohol or illicit drug use.  She is single and  not currently working.  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   ALLERGIES:  No known drug allergies.   MEDICATIONS:  Toprol, Lasix, colchicine, Synthroid, Nexium, Seroquel, Paxil,  Avandia, Lipitor, Reglan, Wellbutrin, Tylox, Klonopin, and she has 2 Tylox  remaining per her report.   Function and quality of life indices have declined.  Sleep is poor.   PHYSICAL EXAMINATION:  GENERAL:  Obese female in no acute distress.  VITAL SIGNS:  Blood pressure 139/63, pulse 66, respirations 16, O2  saturation 97% on room air.  BACK:  Examination reveals level pelvis without scoliosis.  There is  increased lumbar lordosis.  There is tenderness to palpation of the lower  lumbar paraspinous muscles.  Range of motion of the lumbar spine is full in  all planes with minimal discomfort.  EXTREMITIES:  Examination of the lower extremities reveals full range of  motion of the knees, hip, and ankles bilaterally.  There are no effusions  about the knees.  There is mild pretibial edema, however, bilaterally.  No  heat or erythema noted in the lower extremities.  There is no pain on range  of motion of the knees bilaterally.  No instability to valgus or varus  stresses about the knees bilaterally.  There is negative anterior drawer,  posterior drawer, Lachman's, McMurray's bilaterally.  Negative patellar  apprehension.  No tenderness with compression of the patellae bilaterally. There is no medial or lateral joint line tenderness bilaterally.  NEUROLOGIC:  Muscle testing is 5/5 bilateral lower  extremities.  Sensory  exam is intact to light touch bilateral lower extremities at this time.  Muscle stretch reflexes are 2+/4 bilateral patellae and medial hamstrings  and 0/4 bilateral Achilles.  Straight leg raise is negative bilaterally.  Fabere's is negative bilaterally.  There are tight hamstrings and hip  flexors bilaterally.  IMPRESSION:  1. Bilateral knee pain,chronic.  This is likely secondary to patellofemoral     pain syndrome.  No instability noted with negative provacative maneuvers.  2. Chronic low back pain with degenerative disk disease of the lumbar spine,     spondylolisthesis, and spinal stenosis.   PLAN:  1. Discussed further treatment options with the patient.  Initially I would     like to get her started in aquatic therapy for range of motion,     stretching, strengthening, low to non-impact aerobic exercise program     leading to an independent pool program two to three times per week for     four weeks.  2. Will prescribe Narco 5 mg/325 mg 1 p.o. t.i.d. as needed for pain, #50     without refills.  Discussed pain medicine with the patient.  Low-dose     opiates are appropriate in this patient as she does have renal     insufficiency, and I would be hesitant to put her back on any     nonsteroidal anti-inflammatory medications.  Would also consider Ultram     or Ultracet; however, would use these with caution secondary to renal     insufficiency.  3. Consider repeat lumbar epidural steroid injection for low back pain with     lower extremity radicular symptoms if not improved with physical therapy.  4. Consider thyroid studies as patient has mentioned some unexplained weight     gain with history of hypothyroidism status post thyroid resection.  5. The patient will return to clinic in one month for reevaluation.  6. The patient is to follow up with Dr. Shana Chute.   The patient was educated about findings and recommendations and understands.  No barriers to  communication.                                                Zachary George, DO    JW/MEDQ  D:  08/18/2002  T:  08/18/2002  Job:  [id number]   cc:   Osvaldo Shipper. Spruill, M.D.  [address] [address]  [place]  [place] [address]  Fax: [phone]

## 2010-12-22 NOTE — Op Note (Signed)
NAME:  [name] [name], [name] [name]                           ACCOUNT NO.:  [id number]   MEDICAL RECORD NO.:  [id number]                   PATIENT TYPE:  AMB   LOCATION:  ENDO                                 FACILITY:  [facility]   PHYSICIAN:  Georgiana Spinner, M.D.                 DATE OF BIRTH:  29-Jan-1941   DATE OF PROCEDURE:  01/18/2004  DATE OF DISCHARGE:                                 OPERATIVE REPORT   PROCEDURE:  Flexible sigmoidoscopy.   INDICATIONS FOR PROCEDURE:  Colon cancer screening.   ANESTHESIA:  None further given.   DESCRIPTION OF PROCEDURE:  Of note, the patient had noted that she had not  responded well to the prep and subsequently the Olympus videoscopic  colonoscope was inserted in the rectum and passed under direct vision to  approximately 40 cm from the anal verge, throughout which we encountered  semisolid stool and I elected at this point not to proceed further since the  prep was certainly inadequate.  The endoscope was then withdrawn taking  circumferential views of the colonic mucosa as best visualized.  Again,  suboptimal prep and there could be certainly missed gross lesions.  The  pullback of the rectum which appeared normal on direct showed hemorrhoids on  retroflexed view.  The endoscope was straightened and withdrawn.  The pulse  oximetry remained stable.  The patient tolerated the procedure well without  apparent complications.   FINDINGS:  Suboptimal prep probably related to the fact that the patient was  on chronic narcotics.  She symptomatically has gastroparesis and I would  think she has slow transit through the gastrointestinal tract which may have  precluded an adequate prep at this point.   PLAN:  To get a gastric emptying scan and have her follow up with me as an  outpatient and consider a repeat examination with a more substantial prep.                                               Georgiana Spinner, M.D.    GMO/MEDQ  D:  01/18/2004  T:  01/18/2004  Job:   [id number]   cc:   Loraine Leriche T. Nile Riggs, M.D.  Fax: [phone]

## 2010-12-22 NOTE — Consult Note (Signed)
NAMEESRA, FRANKOWSKI                 ACCOUNT NO.:  [id number]   MEDICAL RECORD NO.:  [id number]          PATIENT TYPE:  INP   LOCATION:  5703                         FACILITY:  [facility]   PHYSICIAN:  Dennis Bast, MD        DATE OF BIRTH:  06-30-1941   DATE OF CONSULTATION:  DATE OF DISCHARGE:                                   CONSULTATION   DATE OF CONSULTATION:  November 24, 2005.   A 70 year old black woman with past medical history significant for chronic  renal insufficiency at stage 4, baseline GFR approximately 17 ml per minute  followed by Dr. 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  Admitted for cellulitis of the left foot for IV antibiotic treatment.  The patient was admitted with a creatinine of 3.5 on November 23, 2005, and  today her creatinine is 3.8.  On reviewing the chart, the patient had a  creatinine as low as 2.6 on May 08, 2005, and then fluctuating between  3.1 to 3.8.  The patient denies use of NSAIDs, vomiting, diarrhea, fever, or  chills.   REVIEW OF SYSTEMS:  No chest pain, no fever, no urinary symptoms, no use of  over-the-counter medications except for Tylenol for back pain.   PAST MEDICAL HISTORY:  1.  Chronic renal insufficiency since age [age] followed by Dr. [place] Guitar.  2.  Type 2 diabetes mellitus.  3.  Hypothyroidism, status post surgery in 1997, with a history of vocal      cord palsy.  4.  Osteoarthritis.  5.  Gout.  6.  Depression.  7.  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  8.  History of anemia.  9.  History of acute renal failure in 1992, per patient, secondary to      probable NSAID use and uncontrolled hypertension.   ALLERGIES:  No known drug allergies.   HOME MEDICATIONS:  1.  Albuterol MDI p.r.n.  2.  Aspirin 81 mg p.o. daily.  3.  Requip 50 mg p.o. q.h.s.  4.  Lyrica 50 mg t.i.d.  5.  Furosemide 80 mg p.o. b.i.d.  6.   Colestid 0.1 mg t.i.d.  7.  Colchicine 0.6 mg b.i.d.  8.  Oxycodone 15 mg q.6h p.r.n.  9.  Tramadol 60 mg q.4-6h p.r.n.  10. Effexor-XR 150 mg daily.  11. Lipitor 40 mg daily.  12. Protonix 40 mg daily.  13. Seroquel 50 mg q.h.s.  14. Lexapro 10 mg daily.  15. Actos 40 mg daily.  16. Synthroid 150 mcg daily.  17. Levaquin 400 mg started on November 08, 2005.  Now at the hospital, also      Vancomycin IV was added.   SOCIAL HISTORY:  The patient lives alone in [place].  She used to work at  [employer] as a Runner, broadcasting/film/video but after her vocal palsy post thyroidectomy,  she could not continue to do that.  She has a history of heavy tobacco use  and she is currently smoking half a  pack per day.  She denies alcohol use.   PHYSICAL EXAMINATION:  GENERAL:  The patient is seated in bed and in no  acute distress.  She is pleasant.  VITAL SIGNS:  Temperature 98.2, pulse 74, respiratory rate 18, blood  pressure 105/62, saturation 98% on room air.  In's are 1164, out's 1050.  She has been positive 114.  HEENT:  PERRLA, extraocular movement intact, pharynx is clear, mucosa are  moist.  NECK:  Supple with scar of thyroidectomy.  HEART:  Regular rate and rhythm with no murmurs.  LUNGS:  Clear to auscultation.  ABDOMEN:  Soft and nontender, bowel sounds are positive.  EXTREMITIES:  She has no edema.  The left foot is with dressing and when  taken off she has edema and edema of the toes and forefoot.  The right foot,  she has pedal pulses 2+ and onychomycosis.   LABORATORY DATA:  Magnesium 2.1, sodium 139, potassium 4.2, chloride 104,  bicarb 26, BUN 46, creatinine 3.8, glucose 94, calcium 8.6, hemoglobin 10.5,  hematocrit 30.6, white blood cell count 7.4, platelets 260, MCV 94.1.   PROBLEM LIST:  1.  Chronic renal insufficiency:  Creatinine no higher than prior      fluctuations.  Probably secondary to current infectious and inflammatory      state.  No emergent need for dialysis.  The plan is  to get records from      Dr. Roanna Banning office, DC IV fluids to normal saline locks, check      phosphorus level, check urinalysis and microscopy, check PTH, follow      renal failure, strict I's&O's.  Check SPEP, UPEP, and 24-hour urine,      start NephroVite one time daily, and save the right arm.  2.  Anemia of chronic diseases:  The plan is to check iron restoration and      ferritin.  3.  Left foot cellulitis:  Change the dose to 100 mg q.48h p.o.  4.  Diabetes mellitus type 2:  That is per the primary team.  5.  Depression:  Continue her home medication.  6.  Hypertension:  Continue medications.  7.  Gout.   The patient is approaching to end-stage renal disease, and she is not ready  for discussion of dialysis.  We are going to save the right arm, and we will  do dialysis if needed.   Thanks for the consult, and we will follow along with primary team.      Dennis Bast, MD     YC/MEDQ  D:  11/24/2005  T:  11/26/2005  Job:  [id number]

## 2010-12-22 NOTE — Assessment & Plan Note (Signed)
[facility]   NAMETRACE, CEDERBERG                 ACCOUNT NO.:  [id number]   MEDICAL RECORD NO.:  [id number]      DATE OF BIRTH:  July 27, 1941   PHYSICIAN:  Theresia Majors. Tanda Rockers, M.D. VISIT DATE:  04/23/2006                                     OFFICE VISIT   VITAL SIGNS:  Blood pressure 110/70, respirations 16, pulse rate 65, and she  is afebrile.   PURPOSE OF TODAY'S VISIT:  Ms. Munshi is a 69 year old lady who was initially  evaluated for an ulceration on the dorsum of the left foot. She has been  treated with topical agents as well as compression. She reports that she has  had no drainage, pain, or fever and feels that the wound has resolved.   WOUND EXAM:  Inspection of the left foot shows that the ulcer has completely  resolved. There is some desquamated skin which was cleaned with our wound  solution.   WOUND SINCE LAST VISIT:   CHANGE IN INTERVAL MEDICAL HISTORY:   DIAGNOSIS:  Resolved wound.   TREATMENT:   ANESTHETIC USED:   TISSUE DEBRIDED:   LEVEL:   CHANGE IN MEDS:   COMPRESSION BANDAGE:   OTHER:   MANAGEMENT PLAN & GOAL:  We are discharging Ms. Densmore. We will follow her up  on a p.r.n. basis.           ______________________________  Theresia Majors Tanda Rockers, M.D.     Cephus Slater  D:  04/23/2006  T:  04/24/2006  Job:  [id number]

## 2010-12-22 NOTE — Op Note (Signed)
[name] [name], [name] [name]                 ACCOUNT NO.:  [id number]   MEDICAL RECORD NO.:  [id number]          PATIENT TYPE:  INP   LOCATION:  6729                         FACILITY:  [facility]   PHYSICIAN:  Leonides Grills, M.D.     DATE OF BIRTH:  1941/06/18   DATE OF PROCEDURE:  05/16/2005  DATE OF DISCHARGE:                                 OPERATIVE REPORT   PREOPERATIVE DIAGNOSIS:  Left toe dry gangrene.   POSTOPERATIVE DIAGNOSIS:  Left toe dry gangrene.   OPERATION:  Left second toe amputation through metatarsophalangeal joint.   ANESTHESIA:  General.   SURGEON:  Leonides Grills, M.D.   ASSISTANT:  Lianne Cure, P.A.   ESTIMATED BLOOD LOSS:  Minimal.   TOURNIQUET:  None.   COMPLICATIONS:  None.   DISPOSITION:  Stable to PAR.   INDICATIONS:  This is a 70 year old female who over one year has had  progressive bluish discoloration to her left second toe and over the last  week to two weeks, she has had dry gangrenous changes.  I was consulted for  a second opinion by Dr. Rennis Chris and I concurred that it is dry gangrene and  she will require the above procedure, and the patient agreed.  She consented  to the above procedure.  All risks, which include infection, nerve or vessel  injury, more proximal amputation, possibility of dry gangrene of other toes,  were all explained, questions wee encouraged and answered.   OPERATION:  The patient was brought to the operating room and placed in  supine position after adequate general endotracheal tube anesthesia was  administered as well as Ancef 1 g IV piggyback.  The left lower extremity  was then prepped and draped in the usual sterile manner.  No tourniquet was  used.  A racquet-shaped incision was then made over the second toe.  Dissection was carried down directly to bone.  The MTP joint was then  entered and the  toe was resected.  Hemostasis was obtained.  The area was copiously  irrigated with normal saline.  The subcu was closed with  3-0 Vicryl.  The  skin was closed with 4-0 nylon.  A sterile dressing was applied.  A hard-  soled shoe was applied.  The patient was stable to the PAR.      Leonides Grills, M.D.  Electronically Signed     PB/MEDQ  D:  05/16/2005  T:  05/16/2005  Job:  [id number]

## 2010-12-22 NOTE — Consult Note (Signed)
NAME:  Rota, Avenly                 ACCOUNT NO.:  [id number]   MEDICAL RECORD NO.:  [id number]          PATIENT TYPE:  INP   LOCATION:  6729                         FACILITY:  [facility]   PHYSICIAN:  Wilber Bihari. Caryn Section, M.D.   DATE OF BIRTH:  [birth year]   DATE OF CONSULTATION:  05/21/2005  DATE OF DISCHARGE:                                   CONSULTATION   Ms. Cannedy is a 71 year old black woman admitted with gangrenous toes on left  foot May 08, 2005. She has been treated with antibiotics, had MRI with  and without gadolinium (20 mL) on May 10, 2005. She had an amputation of  her left second toe on May 16, 2005. Systolic blood pressure decreased  into the 100s for 30 minutes during surgery (Versed, Diprivan, fentanyl,  lidocaine). Renal function has worsened and renal consult was requested.   She has been followed in our office since 1997 when she was seen following  hospitalization (BUN 174, creatinine 9.1). Acute renal failure resolved at  that time. Etiology of decreased renal function unknown (analgesic  nephropathy was noted at one point). Results of creatinine over the last  few years are as follows:   Date: September 2005    February 19, 2005     October 3   October 11  October 15  CR    2.3               2.8               2.6         3.2         3.6   CURRENT MEDICATIONS:  1.  Zocor 40 per day.  2.  Protonix 40 per day.  3.  Lasix 80 b.i.d.  4.  Lyrica 75 per day.  5.  ReQuip 0.5 daily.  6.  Atrovent/albuterol q.i.d.  7.  Aspirin 81 per day.  8.  Klonopin one t.i.d.  9.  Colchicine 0.6 per day.  10. Synthroid 0.125 per day.  11. Actos 30 per day.  12. Seroquel 50 b.i.d.  13. Effexor 75 b.i.d.  14. Augmentin 500 b.i.d.   PAST MEDICAL HISTORY:  1.  Diabetes mellitus type 2 x6 years.  2.  Hypertension 15-20 years.  3.  Thyroid surgery (goiter) with nerve damage (hoarseness, surgery done      by Dr. Francina Ames).  4.  Cholecystectomy.  5.  Gout.  6.  MRI done  October 5 with 20 mL gadolinium (Omniscan).   No NSAIDs; no [employer] [place], etc.   OTHER REVIEW OF SYSTEMS:  She complains of urinary frequency, urgency,  nocturia, and stress incontinence. She has had urinary tract infections in  the past. No renal colic, no gross hematuria. She has had edema, orthopnea.  No angina, no claudication, no melena, no hematochezia, no hemoptysis, no  loss of consciousness, no palpitations, no cold or heat intolerance, no  seizures.   SOCIAL HISTORY:  She was born in [place]. 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 She lives alone. She has  been married but her marriage was annuled. She has a 50 pack-year history of  cigarettes. No alcohol, no children.   FAMILY HISTORY:  Six brothers, two sisters (one brother had recent CVA and  MI). No one in family with renal disease. Mother has hypertension (age 75).  Father died age 20 of pneumonia.   PHYSICAL EXAMINATION:  GENERAL:  She is awake, alert.  VITAL SIGNS:  Temperature 97.8, pulse 80, respirations 20, blood pressure  97/53.  NECK:  Thyroid surgery scar.  CHEST:  Clear.  HEART:  No rub.  ABDOMEN:  Nontender.  EXTREMITIES:  Recent surgery left foot, 1+ edema pretibial.  NEUROLOGIC:  Left-handed. Decreased sensation in feet.   IMPRESSION:  1.  Worsening of renal function in a patient with underlying renal disease,      question if related to gadolinium, hypertension, diuresis, other.  2.  Diabetes mellitus.  3.  High blood pressure (none now).  4.  Recent left toe amputation (on Unasyn).   SPEP was negative for monoclonal protein February 2006. Renal ultrasound was  negative for obstruction over 5 years ago. I/O over the last 10 days:  I:  15,745 mL; O:  21,050 mL (on b.i.d. Lasix).   PLAN:  Hold Lasix, weigh (she weighed 188 pounds in July 2006), renal  ultrasound, UPEP, chest  x-ray (to see if she needs further diuresis), urine  for urinalysis, Hansel stain, CBC with diff. No IVs or needle sticks in  right forearm (save for vascular access if ever needed for dialysis).           ______________________________  Wilber Bihari. Caryn Section, M.D.     RFF/MEDQ  D:  05/21/2005  T:  05/21/2005  Job:  [id number]

## 2010-12-22 NOTE — Assessment & Plan Note (Signed)
[facility]   [name], [name]                 ACCOUNT NO.:  [id number]   MEDICAL RECORD NO.:  [id number]      DATE OF BIRTH:  1940-12-10   PHYSICIAN:  Theresia Majors. Tanda Rockers, M.D. VISIT DATE:  04/26/2006                                     OFFICE VISIT   VITAL SIGNS:  Blood pressure is 112/75, respirations are 20, pulse rate 74,  and [name] [name] is afebrile.  Capillary blood glucose is 99 mg%.   PURPOSE OF TODAY'S VISIT:  [name] is a 70 year old lady who was recently  discharged from the [facility] for management of open wound of her left  foot.  In the interim [name] [name] reports that [name] [name] has had inflammation and weeping  of the foot.  We have instructed her to come into the clinic for evaluation.  [name] [name] denies toxin exposure or interim trauma.   WOUND EXAM:  Inspection of the left foot shows that there are punctate  hyperemic pustules with marked desquamation.  The foot itself looks improved  since her discharge.  There is no weeping at present.   DIAGNOSIS:  Dermatitis, questionable etiology.   MANAGEMENT PLAN & GOAL:  We have instructed the patient to bathe her feet  twice a day with antiseptic soap and apply an over-the-counter Topicort  preparation.  [name] [name] may continue to use Vaseline on the areas of intense  desquamation to promote the shedding of this dead skin.  The patient should  notice some improvement in her skin within the next 48-72 hours.  If [name] [name]  still has concerns or if [name] [name] notices redness, increasing pain, swelling [name] [name]  is to call the clinic for a followup visit.  Otherwise, [name] [name] may be safely  followed by her primary care physician.           ______________________________  Theresia Majors. Tanda Rockers, M.D.     Cephus Slater  D:  04/26/2006  T:  04/29/2006  Job:  [id number]

## 2010-12-22 NOTE — Assessment & Plan Note (Signed)
[facility]   [name] [name], [name] [name]                 ACCOUNT NO.:  [id number]   MEDICAL RECORD NO.:  [id number]      DATE OF BIRTH:  22-Apr-1941   PHYSICIAN:  Theresia Majors. Tanda Rockers, M.D.      VISIT DATE:                                     OFFICE VISIT   VITAL SIGNS:  Blood pressure is 125/80, respirations 20, pulse rate 82, and  she is afebrile.  Capillary blood glucose was 111 mg% this morning.   PURPOSE OF TODAY'S VISIT:  [name] was seen approximately a year ago in  the wound center where she was treated for a diabetic foot ulcer and was  subsequently seen and managed by Dr. Lestine Box who eventually did a ray  amputation of her second digit of the left foot.  Apparently she did well.  She has had a change in her primary care physician and is now under the care  of Dr. Oliver Barre.  The patient was recently seen by Dr. Jonny Ruiz, was treated  with an antibiotic that the patient describes as doxycycline.  At the same  time she was seen by Dr. Lestine Box for a breakdown on the dorsum of the left  foot.  The patient was referred to the wound center for management of the  wound.  She denies fever, denies pain.  She does admit to some redness and  some bloody dressing on her bed clothing as well as her sock.  She states  the beginning of this wound to an excessive excursion approximately 2 weeks  ago.   WOUND EXAM:  Inspection of the lower extremities shows that there are  readily palpable pulses.  There is 2+ bilateral edema.  On the right foot,  there are extensive calluses at the tips of the digits and there are  atrophic changes consistent with diabetes.  On the left foot, there is  surgical absence of the toe.  There is a second toe and there is excoriation  over the dorsum of the foot with an area of full thickness blister which was  debrided.  The area on the left foot is hyperemic but there is no evidence  of lymphangitic spread.  The lateral lower leg there is similar  inflamed  target appearing wound with a serous drainage.  On today's exam the patient  is anesthetic.  She  had no perception of fine touch by the [facility]  filament.   WOUND SINCE LAST VISIT:   CHANGE IN INTERVAL MEDICAL HISTORY:   DIAGNOSIS:  Probable diabetic neuropathy with an injury related to mal-  fitting footwear.   TREATMENT:  The wound on the dorsum of the left foot was full thickness  debrided and cultured per nurse.  The wounds have been cultured.  We have  cleansed the wounds with  rinse as well as  gel, placed her in a semi-  compressive wrap for protection.   ANESTHETIC USED:   TISSUE DEBRIDED:  Dorsum of the left foot.   LEVEL:   CHANGE IN MEDS:   COMPRESSION BANDAGE:   OTHER:   MANAGEMENT PLAN & GOAL:  Clean the wounds.  See her back in 72-96 hours.  Eventually, she will need custom shoes and inserts  to avoid recurrent injury  to her feet.           ______________________________  Theresia Majors. Tanda Rockers, M.D.     Cephus Slater  D:  04/12/2006  T:  04/12/2006  Job:  [id number]   cc:   Corwin Levins, MD  Leonides Grills, M.D.

## 2010-12-22 NOTE — Discharge Summary (Signed)
NAME:  [name] [name], [name] [name]                 ACCOUNT NO.:  [id number]   MEDICAL RECORD NO.:  [id number]          PATIENT TYPE:  INP   LOCATION:  6729                         FACILITY:  [facility]   PHYSICIAN:  Osvaldo Shipper. Spruill, M.D.DATE OF BIRTH:  [birth year]   DATE OF ADMISSION:  05/08/2005  DATE OF DISCHARGE:  05/23/2005                                 DISCHARGE SUMMARY   DISCHARGE DIAGNOSES:  1.  Type 2 diabetes mellitus.  2.  Chronic bronchitis.  3.  Gangrene lesion involving the second toe of the left foot.  4.  Osteomyelitis.  5.  Depressive disorder.  6.  Tobacco abuse.   CONSULTATIONS:  Dr. Leonides Grills.   PROCEDURE:  Toe amputation May 16, 2005.   Ms. [name] is a 70 year old patient with a history of hypertension, diabetes,  renal insufficiency and depression who was admitted with a diabetic foot  ulcer of the second toe on the left foot.  The patient noted that the toe  turned gradually dark, began draining foul-smelling drainage and, after  evaluation, it was the opinion that patient would need an admission for  additional evaluation of this problem.  The patient was subsequently  admitted for evaluation of this particular problem.  The evaluation revealed  osteomyelitis of the second toe.   The surgeon was consulted and patient was placed on vancomycin.   After this information was obtained, discussion was held with the patient  concerning surgical intervention.   Plans were made for two-dimensional echocardiogram and lower extremity  Doppler studies.  The lower extremity Doppler studies reveal the AVIs to be  within normal limits.  The transthoracic echocardiogram revealed an overall  left ventricular systolic function was normal.  The left ventricular  ejection fraction was estimated between 55 and 65%.  There was no left  ventricular regional wall motion abnormality and there was minimal mitral  valve regurgitation.   The patient tolerated the procedure quite well, had  very few complications  after the surgical procedure removal of the second toe.   It was noted that the patient lives alone and plans would be made for Home  Health assistance before she could be discharged.   On May 21, 2005, the patient was seen on renal consultation.  The renal  team was concerned about worsening of the patient's renal status, the  diabetes and the recent left toe amputation.  After their evaluation, it was  the opinion that the Lasix should be held.  The patient should be weighed  daily.  Renal ultrasound was ordered.  Urinalysis was also obtained.   At this point, plans were then made for [facility] admission and evaluation.   The patient continued to do well and, on May 23, 2005, she was  transferred to the [facility] area for rehabilitation and assistance with her  healing process.  She tolerated this without problem.  The patient will be  followed medically while in the [facility] for any problems or complications.      Ivery Quale, P.A.      Osvaldo Shipper. Spruill, M.D.  Electronically Signed  HB/MEDQ  D:  07/18/2005  T:  07/19/2005  Job:  [id number]

## 2010-12-22 NOTE — Discharge Summary (Signed)
NAME:  Welge, [facility]                 ACCOUNT NO.:  [id number]   MEDICAL RECORD NO.:  [id number]          PATIENT TYPE:  ORB   LOCATION:  4531                         FACILITY:  [facility]   PHYSICIAN:  Osvaldo Shipper. Spruill, M.D.DATE OF BIRTH:  Dec 04, 1940   DATE OF ADMISSION:  05/23/2005  DATE OF DISCHARGE:  05/31/2005                                 DISCHARGE SUMMARY   DISCHARGE DIAGNOSES:  1.  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  2.  Type 2 diabetes mellitus.  3.  Fluid retention.  4.  Renal insufficiency.  5.  Anemia.   Ms. [name] [name] is a 70 year old patient who underwent amputation of the  left second toe by Dr. Lestine Box.  The patient was then followed medically.  There were some issues with pain control.  These were addressed.  The  patient was seen by activity coordinators.  She was gradually able to  ambulate with the walker.  There was some problems with reflux and this was  controlled with proton pump inhibitors.   The patient tolerated the procedure and follow-up well.  She was gradually  able to ambulate with the rolling walker.  The surgical site did well and,  on May 31, 2005, the patient was able to be discharged home from the  [place] area.   DISCHARGE MEDICATIONS:  1.  Synthroid 0.125 mg, 1 daily.  2.  Combivent 2 puffs every six hours.  3.  Actos 30 mg daily.  4.  Augmentin 250 mg per 5 mL b.i.d.  5.  Zocor 40 mg daily.  6.  Protonix 40 mg b.i.d.  7.  Lasix 80 mg b.i.d.  8.  Klonopin 1 mg daily.  9.  Seroquel 25 mg b.i.d.  10. Effexor 37.5 mg daily.  11. Percocet 1 every six hours as needed for pain.   FOLLOW UP:  The patient is to have the dressings changed daily to the left  foot.  She is to follow up with Dr. Lestine Box in two weeks.  She is also to  follow up in the office in two weeks or sooner if any changes, problems or  concerns.      Ivery Quale, P.A.      Osvaldo Shipper. Spruill, M.D.  Electronically  Signed    HB/MEDQ  D:  07/18/2005  T:  07/19/2005  Job:  [id number]

## 2010-12-22 NOTE — Discharge Summary (Signed)
NAMEXITLALY, AULT                 ACCOUNT NO.:  [id number]   MEDICAL RECORD NO.:  [id number]          PATIENT TYPE:  INP   LOCATION:  5703                         FACILITY:  [facility]   PHYSICIAN:  Rene Paci, M.D. LHCDATE OF BIRTH:  Mar 14, 1941   DATE OF ADMISSION:  11/24/2005  DATE OF DISCHARGE:  11/28/2005                                 DISCHARGE SUMMARY   DISCHARGE DIAGNOSES:  1.  Left foot cellulitis.  2.  Acute on chronic renal insufficiency.  3.  Tobacco abuse.   HISTORY OF PRESENT ILLNESS:  The patient is a 70 year old female with a past  medical history of diabetes type 2, chronic renal insufficiency, who  presented with left lower extremity cellulitis.  The patient had recently  undergone an amputation of the left second toe in October 2006 and stated  that her cellulitis had failed to heal completely since that time.  She  noted that over the several days prior to admission the erythema had become  worse, and she had noticed a slight discharge.  She was started as an  outpatient on Levaquin 500 mg p.o. daily, without improvement.  She was  admitted for further evaluation and treatment.   PAST MEDICAL HISTORY:  1.  Type 2 diabetes.  2.  Hypothyroidism.  3.  Osteoarthritis.  4.  Gout.  5.  Depression.  6.  Chronic renal insufficiency, with baseline creatinine of approximately      3.5.  7.  Status post left second toe amputation, October 2006.  8.  History of anemia.   COURSE OF HOSPITALIZATION:  1.  Left foot cellulitis.  The patient was admitted and was placed on IV      vancomycin as well as Levaquin and was changed to p.o. Avelox on November 27, 2005.  Local wound care was continued, and this will be continued at      time of discharge with wet-to-dry dressing change once daily to be      performed by home health R.N.  Avelox will be continued for a total of 7      days.  The patient underwent an MRI of the left foot during this      hospitalization which  did not reveal osteomyelitis or abscess.  The      patient was evaluated by orthopedics during this admission, Dr. Lestine Box.  2.  Acute on chronic renal insufficiency.  The patient was evaluated by the      renal team this admission.  She is seen outpatient by Dr. Lowell Guitar.  It      was recommended that the patient be maintained on Aranesp 100 mcg      subcutaneously weekly.  She was given IV iron x1 for iron deficiency      anemia during this admission.  She will need followup with Dr. Lowell Guitar.      I have left a message with his scheduler to contact the patient with an      appointment for followup.   MEDICATIONS AT DISCHARGE:  1.  Avelox 400 mg  p.o. daily for 9 days.  2.  Synthroid 150 mcg p.o. daily.  3.  Aspirin 81 mg p.o. daily.  4.  Requip 0.25 mg 2 tabs p.o. daily.  5.  Lyrica 50 mg p.o. t.i.d.  6.  Lasix 80 mg p.o. b.i.d.  7.  Clonazepam 1 mg p.o. t.i.d.  8.  Oxycodone 15 mg p.o. q.6 h. as needed.  9.  Lexapro 10 mg p.o. daily.  10. Actos 45 mg p.o. daily.  11. Effexor 225 mg p.o. daily.  12. Lipitor 40 mg p.o. daily.  13. Protonix 40 mg p.o. daily.  14. Seroquel 100 mg p.o. at bedtime.  15. Calcium plus D 1500 mg p.o. daily.  16. Multivitamin 1 tab p.o. daily (Nephro-Vite).  17. Aranesp 100 mcg injection once weekly per Dr. Lowell Guitar and team.   PERTINENT LABORATORIES AT DISCHARGE:  Creatinine 3.4, BUN 50.  Hemoglobin  9.6.   FOLLOWUP:  1.  The patient is instructed to follow up with Dr. Oliver Barre on Dec 04, 2005 at 9:15 a.m.  She will be continued with daily wet-to-dry dressing      changes per home health to left foot.  2.  The patient is also instructed to follow up with Dr. Lowell Guitar.  A message      has been left with their office to schedule an appointment.   DISPOSITION:  Plan to transfer the patient home with home health R.N.  She  is instructed to call Dr. Jonny Ruiz should she develop fever over 101, increased  swelling, pain, or drainage from the wound.  She has  been instructed to call  Dr. Roanna Banning office should they not contact her with a follow-up  appointment.      Melissa S. Peggyann Juba, NP      Rene Paci, M.D. [facility]  Electronically Signed    MSO/MEDQ  D:  11/28/2005  T:  11/29/2005  Job:  [id number]   cc:   Corwin Levins, M.D. [facility]  [address]  [place]  [place] [address]   Mindi Slicker. Lowell Guitar, M.D.  Fax: [phone]

## 2010-12-22 NOTE — Assessment & Plan Note (Signed)
[name] [name]                            CARDIOLOGY OFFICE NOTE   NAME:[name] [name] STORCK                        MRN:          [id number]  DATE:07/03/2006                            DOB:          1940-10-02    [name] [name] is a 70 year old female whom I recently saw on May 29, 2006.  She has a history of diabetes mellitus, hyperlipidemia,  hypothyroidism, renal insufficiency.  I was asked to evaluate her for  edema and shortness of breath.  At that time we were concerned that her  volume excess may be related to the heart, but also potentially to renal  insufficiency.  We checked blood work at that time and her renal  function showed a BUN and creatinine of 49 and 3.0, and her BNP was 201.  I did discuss the patient with [name] [name], and we increased her to 240  mg of Lasix in the morning and 120 in the evening.  He subsequently  increased her to 240 mg p.o. b.i.d.  She also was scheduled to have an  echocardiogram, which was performed on November 13.  Her LV function was  normal.  There was no significant valvular abnormality noted.  She also  had a Myoview performed to exclude ischemia, and her ejection fraction  was 77%.  There was a small fixed defect at the apex, likely attenuation  artifact.  There was no ischemia noted.  Since increasing her Lasix, her  pedal edema has markedly improved.  She continues to have some dyspnea,  but it also has improved.  There is no chest pain, palpitations, or  syncope.  She is unclear on her medications, as she apparently lost her  list recently.   PHYSICAL EXAM:  Blood pressure 162/70, and her pulse is 105.  She weighs  212 pounds.  This compares to 228 on October 24.  CHEST:  Clear.  CARDIOVASCULAR:  Regular rate and rhythm.  EXTREMITIES:  Trace edema.   DIAGNOSES:  1. Volume excess, most likely secondary to progressive renal      insufficiency.  2. Diabetes mellitus.  3. Hyperlipidemia.  4. History of prior  thyroidectomy secondary to goiter, now      hypothyroid.  5. Renal insufficiency.  6. Chronic back pain.  7. History of peripheral vascular disease.   PLAN:  [name] [name] has much improved after increasing her diuretics.  Her  LV function was normal on her echocardiogram and her Myoview showed no  ischemia and normal LV function as well.  We will therefore continue  with her present dose of Lasix.  I will check a BMET today to follow her  potassium and renal function.  She will need to followup with [name] [name]  concerning this issue, and she may need dialysis in the near future, but  I will leave this to him.  I do think  that her excess fluid is most likely secondary to volume overload  related to renal insufficiency.  She will continue with risk factor  modification.  I will see her back on an as needed  basis.     [name] [name]. [name] Som, MD, [facility]  Electronically Signed    BSC/MedQ  DD: 07/03/2006  DT: 07/03/2006  Job #: [id number]   cc:   [name] [name]. Lowell [name], M.D.

## 2010-12-22 NOTE — Consult Note (Signed)
NAME:  [name] [name], [name] [name]                           ACCOUNT NO.:  [id number]   MEDICAL RECORD NO.:  [id number]                   PATIENT TYPE:  REC   LOCATION:  TPC                                  FACILITY:  [facility]   PHYSICIAN:  Zachary George, DO                      DATE OF BIRTH:  Nov 12, 1940   DATE OF CONSULTATION:  10/23/2002  DATE OF DISCHARGE:                                   CONSULTATION   HISTORY OF PRESENT ILLNESS:  The patient returns to the clinic today for  reevaluation.  She was last seen on October 09, 2002, at which time she  underwent a lumbar epidural steroid injection for spinal stenosis of the  lumbar spine with neurogenic claudication with significant improvement of  her symptoms for about 1-1/2 weeks, until she started moving furniture  around, then she states her symptoms started to return.  She still has  fairly significant low back pain and some radicular symptoms, but the  radicular pain is improved overall.  Her pain today is 9/10 on a subjective  scale.  She continues taking Norco 5 mg, 1-2 pills one to two times per day.  She denies any new neurologic complaints, and I reviewed the health and  history form and 14-point review of systems.   PHYSICAL EXAMINATION:  GENERAL:  Obese female in no acute distress.  VITAL SIGNS:  Blood pressure 146/69, pulse 96, respirations 20, O2  saturation is 97% on room air.  NEUROLOGIC:  No new neurologic findings in the lower extremities including  motor, sensory, and reflexes.   IMPRESSION:  1. 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  2. Chronic low back pain.  3. Degenerative disk disease of the lumbar spine.  4. Spondylolisthesis L3 on L4.  5. Osteoarthritis right knee.   PLAN:  1. Discussed further treatment options with the patient.  I think it is     reasonable to proceed with a second lumbar epidural steroid injection     given her significant improvement after the first, with  pain returning     towards baseline.  2. Await physical therapy.  3. Continue hydrocodone as needed for now.  4. Consider repeat right knee injection at some point if symptoms are not     improved with physical therapy.  5. Patient to return to clinic in two weeks for reevaluation.  Would     consider repeat epidural steroid injection as predicated upon the     patient's symptoms and response.  Hopefully, the patient's symptoms will     be well controlled following a second injection.   PROCEDURE:  Lumbar epidural steroid injection:  The procedure was described  to the patient in detail including risks, benefits, limitations,  alternatives, and potential side effects.  The risks include but are not  limited to  bleeding, infection, spinal headache, nerve injury, paralysis,  increased pain, failure to relieve pain, allergic reaction to medications.  The patient understands and wishes to proceed.  Informed consent was  obtained.  The patient was brought back to the fluoroscopy suite and placed  on the table in prone position.  The skin was prepped and draped in the  usual sterile fashion.  The skin and subcutaneous tissues were anesthetized  with 3 mL of preservative-free 1% lidocaine.  Under direct fluoroscopic  guidance an 18-gauze, 3-1/2-inch Hustead needle was advanced into the right  paramedian L5-S1 epidural space with loss-of-resistance technique.  There  was no CSF, heme, or paresthesias noted.  This was then followed by  injection of 1 mL of Kenalog 40 mg/mL plus 3 mL of normal saline with needle  flush.  There were no complications.  The patient tolerated the procedure  well.  Discharge instructions were given.  The patient's postinjection pain  score is 0/10.  She was monitored and released in stable condition.   The patient was educated on the above findings and recommendations and  understands.  There were no barriers to communication.                                                Zachary George, DO    JW/MEDQ  D:  10/23/2002  T:  10/24/2002  Job:  [id number]   cc:   Osvaldo Shipper. Spruill, M.D.  [address] [address]  [place]  [place] [address]  Fax: [phone]

## 2010-12-22 NOTE — H&P (Signed)
NAMEMARIALUISA, [name]                 ACCOUNT NO.:  [id number]   MEDICAL RECORD NO.:  [id number]          PATIENT TYPE:  EMS   LOCATION:  ED                           FACILITY:  [facility]   PHYSICIAN:  Arvilla Meres, M.D. LHCDATE OF BIRTH:  04/04/1941   DATE OF ADMISSION:  11/23/2005  DATE OF DISCHARGE:                                HISTORY & PHYSICAL   CHIEF COMPLAINT:  Left foot cellulitis.   HISTORY OF PRESENT ILLNESS:  The patient is a 70 year old African American  female with a past medical history most notable for type 2 diabetes and  chronic renal insufficiency who presents with left lower extremity  cellulitis.  The patient underwent an amputation of the left second toe in  October 2006.  The patient states her cellulitis has failed to heal  completely since that time.  She states that over the last several days, the  erythema has become worse with a slight discharge.  She was initiated on  Levaquin 500 mg p.o. every day on November 08, 2005, however, the foot has  failed to improve.  She denies any fevers or worsening pain with the  extremity.  She is able to ambulate without difficulty.   PAST MEDICAL HISTORY:  1.  Type 2 diabetes.  2.  Hypothyroidism.  3.  Osteoarthritis.  4.  Gout.  5.  Depression.  6.  Chronic renal insufficiency with baseline creatinine approximately 3.5.  7.  Status post left second toe amputation, October 2006.  8.  History of anemia.   FAMILY HISTORY:  Noncontributory.   SOCIAL HISTORY:  The patient lives alone locally in [place].  She has a  history of heavy tobacco use, however, states that she is currently smoking  1/2 pack per day.  She denies any alcohol or illicit substances.   ALLERGIES:  No known drug allergies.   MEDICATIONS:  1.  Albuterol MDI p.r.n.  2.  Aspirin 81 mg every day.  3.  Requip 50 mg q.h.s.  4.  Lyrica 50 mg t.i.d.  5.  Furosemide 80 mg b.i.d.  6.  Clonazepam 1 mg t.i.d.  7.  Colchicine 0.6 mg b.i.d.  8.   Oxycodone 15 mg q.6 h. p.r.n.  9.  Tramadol 50 mg q.4-6 h. p.r.n.  10. Effexor XR 150 mg every day.  11. Lipitor 40 mg every day.  12. Protonix 40 mg every day.  13. Seroquel 50 mg q.h.s.  14. Lexapro 10 mg every day  15. Actos 40 mg every day.  16. Synthroid 150 mcg every day.  17. Levaquin 500 mg every day (started November 08, 2005).   REVIEW OF SYSTEMS:  Per HPI.  Otherwise, complete review of systems is  negative.   PHYSICAL EXAMINATION:  VITAL SIGNS:  Temperature is 97.5, blood pressure is  153/73 with a heart rate of 83.  GENERAL:  The patient is alert and oriented x3 in no acute distress.  Pleasant and conversant.  NECK:  Supple.  Full range of motion.  Mild JVD.  CHEST:  Bibasilar crackles with infrequent expiratory wheezing.  CARDIOVASCULAR:  Examination  reveals normal S1 and S2 without audible  murmurs, rubs or gallops.  Her abdomen is obese, soft, nontender,  nondistended, positive bowel sounds.  EXTREMITIES:  Her extremities are most notable for left lower extremity  cellulitis.  There is an area of erythema that extends from the base of the  toes to the mid foot.  There are several areas that are open with mild  drainage.  The foot is not painful to palpation.  There is 1+ pitting edema  bilaterally, symmetric.  SKIN:  Other than findings mentioned on the extremity examination, there are  no new rashes or lesions.  NEUROLOGIC:  Grossly nonfocal.   LABORATORY DATA:  White blood cell count is 8.0, hematocrit 34, platelet  count 315,000.  Sodium is 137, potassium 4.1, chloride 101, CO2 was 27, BUN  49, creatinine 3.9, glucose is 117.   IMPRESSION:  A 70 year old African American female with a chronic nonhealing  left lower extremity cellulitis.   PLAN:  The patient was initiated on oral antibiotics earlier this month but  has failed to see improvement in her cellulitis.  She is afebrile with a  normal white count and without any other signs of systemic illness.  We  will  plan to continue her Levaquin 500 mg p.o. every day.  In addition we will  add IV vancomycin for additional gram positive coverage.  Of note, the  patient's creatinine is significantly elevated at 3.9.  On review of her  previous records she has chronic renal insufficiency.  We will initiate the  patient on gentle maintenance IV fluids when more strict I&Os.  We will  continue with her Actos for diabetic control with sliding scale insulin  coverage.      Arvilla Meres, M.D. [facility]  Electronically Signed     DB/MEDQ  D:  11/24/2005  T:  11/24/2005  Job:  [id number]

## 2010-12-22 NOTE — Assessment & Plan Note (Signed)
[facility]                              CARDIOLOGY OFFICE NOTE   NAME:[name] [name], [name] [name]                        MRN:          [id number]  DATE:05/29/2006                            DOB:          1941-02-13    [name] [name] is a 70 year old female with a past medical history of diabetes  mellitus, hyperlipidemia, hypothyroidism and renal insufficiency who  presents for evaluation of edema and shortness of breath.  The patient has  no cardiac history of a murmur by her report.  She did have an  echocardiogram  performed in October of 2006 that showed normal left  ventricular function.  There was mild mitral regurgitation. The patient  states that over the past month, she has noticed worsening pedal edema. She  has also had shortness of breath with exertion.  There is no orthopnea or  PND and there is no syncope.  She did have some chest tightness last week  for most of the week.  It was not pleuritic or positional nor is it  exertional.  However, her predominant symptom is shortness of breath.  Because of this, she asked for further evaluation.   ALLERGIES:  NO KNOWN DRUG ALLERGIES.   MEDICATIONS:  1. Aspirin 81 mg p.o. daily.  2. Requip.  3. Lyrica.  4. Lasix 240 mg p.o. daily.  5. Colchicine 0.6 mg p.o. b.i.d.  6. Clonazepam 1 mg tablet 1-3 p.o. daily.  7. Oxycodone.  8. Tramadol.  9. Lexapro.  10.Actos 150 mcg p.o. daily.  11.Effexor.  12.Lipitor 40 mg p.o. daily.  13.Protonix 40 mg p.o. daily.  14.Robaxin 500 mg p.o. daily.  15.Seroquel.  16.Allopurinol.  17.She also takes a multivitamin, calcium and chromium.   SOCIAL HISTORY:  She does not consume alcohol but she does smoke.   FAMILY HISTORY:  Positive for coronary artery disease in her brother.   PAST MEDICAL HISTORY:  1. Diabetes mellitus.  2. Hyperlipidemia.  3. No hypertension by her report.  4. She has had a prior thyroidectomy secondary to goiter.  She now takes      Synthroid  for replacement.  5. She has a history of renal insufficiency.  6. She has had problems with chronic back pain.  7. She also has peripheral vascular disease.  8. She has had prior digit amputations secondary to her diabetes mellitus.  9. She has also  had prior cholecystectomy.   REVIEW OF SYSTEMS:  She denies any headaches, fever or chills.  There is no  productive cough or hemoptysis.  There is no dysphagia, odynophagia, melena  or hematochezia.  There is no rash or seizure activity.  She has not had  orthopnea or PND but she has had pedal edema.  The remaining systems are  negative.   PHYSICAL EXAMINATION:  GENERAL APPEARANCE:  She is well-developed and  somewhat obese.  She is in no acute distress.  She does not appear to be  depressed and there is no peripheral clubbing.  VITAL SIGNS:  Blood pressure 128/82, pulse 81.  She weight 228 pounds.  SKIN:  Warm and dry.  HEENT:  Unremarkable with normal eye lids.  NECK:  Supple with normal upstroke bilaterally and I cannot appreciate  bruits.  She has jugular venous distension to approximately 9-10 cm.  CHEST:  Mild crackles at the bases.  CARDIOVASCULAR:  Regular rate and rhythm with normal S1 and S2.  I cannot  appreciate murmurs, rubs, or gallops.  ABDOMEN:  Nontender, nondistended, positive bowel sounds.  No  hepatosplenomegaly and no masses appreciated.  There is no abdominal bruit.  She has 1+ femoral pulses bilaterally and no bruits.  EXTREMITIES:  There was 2 to 3+ edema bilaterally.  She is status post  amputation of digits on her left lower extremity.  Her distal pulses are not  palpable due to edema and probable peripheral vascular disease.  I could not  palpate cords.  NEUROLOGIC:  Grossly intact.   Her electrocardiogram shows a sinus rhythm at a rate of 81.  The axis is  normal.  A prior anterior and inferior infarct could not be excluded.   DIAGNOSES:  1. Volume overload/pedal edema.  2. Dyspnea.  3. Atypical chest  pain.  4. Diabetes mellitus.  5. Hyperlipidemia.  6. Renal insufficiency.   PLAN:  Mrs. Bence presents for evaluation of edema of uncertain etiology.  I  wonder whether this may be related to her renal insufficiency and volume  excess.  I have discussed the patient with Dr. Lowell Guitar.  I will check a BMET  today as well as a BNP.  We will increase her Lasix to 240 mg in the morning  and 120 in the evening and then Dr. Lowell Guitar will see her back early next week  to follow her renal function.  I will also check an echocardiogram  to  evaluate her LV function and also an adenosine Myoview.  We will see her  back in one week to make sure that her  symptoms are improving and she is certainly at risk for vascular disease and  coronary disease.  I discussed the importance of discontinuing her tobacco  use.    ______________________________  Madolyn Frieze. Jens Som, MD, [facility]    BSC/MedQ  DD: 05/29/2006  DT: 05/30/2006  Job #: [id number]   cc:   Mindi Slicker. Lowell Guitar, M.D.

## 2011-01-19 ENCOUNTER — Ambulatory Visit: Payer: Self-pay | Admitting: Internal Medicine

## 2011-01-20 ENCOUNTER — Encounter: Payer: Self-pay | Admitting: Internal Medicine

## 2011-02-13 ENCOUNTER — Other Ambulatory Visit: Payer: Self-pay | Admitting: Internal Medicine

## 2011-02-16 ENCOUNTER — Encounter: Payer: Self-pay | Admitting: Internal Medicine

## 2011-02-16 ENCOUNTER — Ambulatory Visit (INDEPENDENT_AMBULATORY_CARE_PROVIDER_SITE_OTHER): Payer: Medicare Other | Admitting: Internal Medicine

## 2011-02-16 VITALS — BP 126/72 | HR 100 | Temp 97.5°F | Resp 14 | Wt 164.0 lb

## 2011-02-16 DIAGNOSIS — E119 Type 2 diabetes mellitus without complications: Secondary | ICD-10-CM

## 2011-02-16 DIAGNOSIS — R197 Diarrhea, unspecified: Secondary | ICD-10-CM

## 2011-02-16 DIAGNOSIS — I10 Essential (primary) hypertension: Secondary | ICD-10-CM

## 2011-02-16 MED ORDER — CYCLOBENZAPRINE HCL 5 MG PO TABS: 5.0000 mg | ORAL_TABLET | Freq: Three times a day (TID) | ORAL | Status: DC | PRN

## 2011-02-16 MED ORDER — METRONIDAZOLE 500 MG PO TABS: 500.0000 mg | ORAL_TABLET | Freq: Three times a day (TID) | ORAL | Status: AC

## 2011-02-16 MED ORDER — PROMETHAZINE HCL 25 MG PO TABS: 25.0000 mg | ORAL_TABLET | Freq: Four times a day (QID) | ORAL | Status: DC | PRN

## 2011-02-16 MED ORDER — PREGABALIN 50 MG PO CAPS: 50.0000 mg | ORAL_CAPSULE | Freq: Every day | ORAL | Status: DC

## 2011-02-16 NOTE — Patient Instructions (Addendum)
Take all new medications as prescribed Continue all other medications as before Please go to LAB in the Basement for the blood and/or urine tests to be done today Please call the phone number [phone] (the PhoneTree System) for results of testing in 2-3 days;  When calling, simply dial the number, and when prompted enter the MRN number above (the Medical Record Number) and the # key, then the message should start.  

## 2011-02-16 NOTE — Assessment & Plan Note (Signed)
Ongoing signficant over 1 wk, so I suspect may be c diff vs viral illness, will check labs with the n/v/pain as well, empiric flagyl tx,  to f/u any worsening symptoms or concerns

## 2011-02-18 ENCOUNTER — Encounter: Payer: Self-pay | Admitting: Internal Medicine

## 2011-02-18 NOTE — Assessment & Plan Note (Signed)
stable overall by hx and exam, most recent data reviewed with pt, and pt to continue medical treatment as before  BP Readings from Last 3 Encounters:  02/16/11 126/72  12/13/10 142/92  10/03/10 130/80

## 2011-02-18 NOTE — Progress Notes (Signed)
Subjective:    Patient ID: [name] [name], female    DOB: July 19, 1941, 70 y.o.   MRN: [id number]  HPI  Here with acute onset 1 wk abd pain, n/v, crampy type with multiple episodes watery stool without blood,  Chills, rash, HA, ST, cough and Pt denies chest pain, increased sob or doe, wheezing, orthopnea, PND, increased LE swelling, palpitations, dizziness or syncope.  Pt denies new neurological symptoms such as new headache, or facial or extremity weakness or numbness   Pt denies polydipsia, polyuria.  Did have phenergan IM per ortho when there July 11, but later wore off.   Past Medical History  Diagnosis Date  . HYPOTHYROIDISM 02/17/2007  . DIABETES MELLITUS, TYPE II 02/17/2007  . HYPERLIPIDEMIA 02/17/2007  . GOUT 05/29/2007  . ANEMIA-NOS 05/29/2007  . ANXIETY 03/23/2010  . 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 DEPRESSION 02/17/2007  . RESTLESS LEG SYNDROME 05/29/2007  . COMMON MIGRAINE 05/29/2007  . PERIPHERAL NEUROPATHY 05/29/2007  . HYPERTENSION 02/17/2007  . PERIPHERAL VASCULAR DISEASE 02/17/2007  . ASTHMATIC BRONCHITIS, ACUTE 10/08/2008  . COPD 05/29/2007  . GERD 02/17/2007  . PEPTIC ULCER DISEASE 05/29/2007  . GASTROENTERITIS 12/02/2007  . RENAL INSUFFICIENCY 02/17/2007  . MENOPAUSAL DISORDER 05/25/2009  . Osteoarth NOS-L/Leg 03/11/2008  . SHOULDER PAIN, LEFT, CHRONIC 09/15/2008  . CERVICAL RADICULOPATHY, LEFT 05/27/2008  . BACK PAIN 12/12/2009  . OSTEOPENIA 09/22/2009  . SEIZURE DISORDER 02/17/2007  . HYPERSOMNIA 05/26/2010  . Memory loss 01/24/2010  . Abnormality of gait 07/30/2007  . ABDOMINAL PAIN -GENERALIZED 11/16/2009  . Personal history of colonic polyps 11/16/2009  . PERSISTENT DISORDER INITIATING/MAINTAINING SLEEP 08/22/2010  . INADEQUATE SLEEP HYGIENE 08/22/2010  . DYSPNEA 08/22/2010  . Wheezing 08/15/2010  . CHEST PAIN 08/15/2010  . CHRONIC OBSTRUCTIVE PULMONARY DISEASE, ACUTE EXACERBATION 09/08/2010  . Palpitations 09/08/2010  . Other diseases of vocal cords 09/19/2010  . HEART MURMUR, HX OF  10/02/2010  . 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 Chronic neck pain 12/13/2010  . Osteopenia 12/13/2010   Past Surgical History  Procedure Date  . Left toe amputated 2006  . Bunionectomy 1980  . Goiter removal 1997  . Stress cardiolite 06/18/2006  . Tranthoracic echocardiogram 06/18/2006  . Electrocardiogram 05/29/2006  . Cholecystectomy   . Rotator cuff repair Left    Dr. Lajoyce Corners    reports that she has been smoking.  She does not have any smokeless tobacco history on file. She reports that she does not drink alcohol or use illicit drugs. family history includes Coronary artery disease in her other; Dementia in her mother; Hyperlipidemia in her other; Hypertension in her other; Ovarian cancer in her other; and Stroke in her other. Allergies  Allergen Reactions  . Nsaids     REACTION: renal dysfunction  . Pioglitazone     REACTION: EDEMA   Current Outpatient Prescriptions on File Prior to Visit  Medication Sig Dispense Refill  . allopurinol (ZYLOPRIM) 100 MG tablet Take 100 mg by mouth daily.        Marland Kitchen atorvastatin (LIPITOR) 40 MG tablet Take 1 tablet (40 mg total) by mouth daily.  90 tablet  3  . chlorpheniramine-hydrocodone (TUSSIONEX PENNKINETIC ER) 10-8 MG/5ML LQCR Take 5 mLs by mouth. 1 tsp. By mouth two times a day as needed for cough       . clonazePAM (KLONOPIN) 2 MG tablet TAKE ONE TABLET BY MOUTH TWICE DAILY AS NEEDED  60 tablet  1  . colchicine 0.6 MG tablet Take 1 tablet (0.6 mg total) by mouth daily.  180 tablet  3  . DULoxetine (CYMBALTA) 60 MG capsule Take 1 capsule (60 mg total) by mouth daily.  90 capsule  3  . furosemide (LASIX) 80 MG tablet Take 80 mg by mouth 3 (three) times daily.        Marland Kitchen glimepiride (AMARYL) 1 MG tablet Take 1 mg by mouth every morning before breakfast. 1 po every day       . HYDROcodone-acetaminophen (NORCO) 7.5-325 MG per tablet Take 1 tablet by mouth every 6 (six) hours as needed for pain. 1 by mouth four times per day as needed for pain  60 tablet  2    . hydrOXYzine (ATARAX) 25 MG tablet Take 25 mg by mouth every 6 (six) hours as needed.        Marland Kitchen levothyroxine (SYNTHROID, LEVOTHROID) 125 MCG tablet Take 1 tablet (125 mcg total) by mouth daily.  90 tablet  3  . QUEtiapine (SEROQUEL) 100 MG tablet Take 100 mg by mouth at bedtime.        . sitaGLIPtan (JANUVIA) 100 MG tablet Take 100 mg by mouth daily. 1/4 by mouth once daily       . tiotropium (SPIRIVA) 18 MCG inhalation capsule Place 18 mcg into inhaler and inhale daily.        . traZODone (DESYREL) 100 MG tablet TAKE ONE TABLET BY MOUTH AT BEDTIME AS NEEDED  90 tablet  1   Review of Systems Review of Systems  Constitutional: Negative for diaphoresis and unexpected weight change.  HENT: Negative for drooling and tinnitus.   Eyes: Negative for photophobia and visual disturbance.  Respiratory: Negative for choking and stridor.   Gastrointestinal: Negative for vomiting and blood in stool.  Genitourinary: Negative for hematuria and decreased urine volume.       Objective:   Physical Exam BP 126/72  Pulse 100  Temp(Src) 97.5 F (36.4 C) (Oral)  Resp 14  Wt 164 lb (74.39 kg)  SpO2 98% Physical Exam  VS noted Constitutional: Pt appears well-developed and well-nourished.  HENT: Head: Normocephalic.  Right Ear: External ear normal.  Left Ear: External ear normal.  Eyes: Conjunctivae and EOM are normal. Pupils are equal, round, and reactive to light.  Neck: Normal range of motion. Neck supple.  Cardiovascular: Normal rate and regular rhythm.   Pulmonary/Chest: Effort normal and breath sounds normal.  Abd:  Soft, non-distended, + BS, with diffuse mild tender, no guarding/rebound Neurological: Pt is alert. No cranial nerve deficit.  Skin: Skin is warm. No erythema.         Assessment & Plan:

## 2011-02-18 NOTE — Assessment & Plan Note (Signed)
stable overall by hx and exam, most recent data reviewed with pt, and pt to continue medical treatment as before  Lab Results  Component Value Date   HGBA1C 6.5 07/20/2010

## 2011-02-21 ENCOUNTER — Telehealth: Payer: Self-pay | Admitting: *Deleted

## 2011-02-21 ENCOUNTER — Inpatient Hospital Stay (HOSPITAL_COMMUNITY)
Admission: EM | Admit: 2011-02-21 | Discharge: 2011-02-24 | DRG: 683 | Disposition: A | Discharge status: Home / Self Care | Payer: Medicare Other | Attending: Internal Medicine | Admitting: Internal Medicine

## 2011-02-21 ENCOUNTER — Other Ambulatory Visit (INDEPENDENT_AMBULATORY_CARE_PROVIDER_SITE_OTHER): Payer: Medicare Other

## 2011-02-21 DIAGNOSIS — M549 Dorsalgia, unspecified: Secondary | ICD-10-CM | POA: Diagnosis present

## 2011-02-21 DIAGNOSIS — E785 Hyperlipidemia, unspecified: Secondary | ICD-10-CM | POA: Diagnosis present

## 2011-02-21 DIAGNOSIS — M109 Gout, unspecified: Secondary | ICD-10-CM | POA: Diagnosis present

## 2011-02-21 DIAGNOSIS — E7800 Pure hypercholesterolemia, unspecified: Secondary | ICD-10-CM | POA: Diagnosis present

## 2011-02-21 DIAGNOSIS — N184 Chronic kidney disease, stage 4 (severe): Secondary | ICD-10-CM | POA: Diagnosis present

## 2011-02-21 DIAGNOSIS — F17200 Nicotine dependence, unspecified, uncomplicated: Secondary | ICD-10-CM | POA: Diagnosis present

## 2011-02-21 DIAGNOSIS — Z79899 Other long term (current) drug therapy: Secondary | ICD-10-CM

## 2011-02-21 DIAGNOSIS — N179 Acute kidney failure, unspecified: Principal | ICD-10-CM | POA: Diagnosis present

## 2011-02-21 DIAGNOSIS — K219 Gastro-esophageal reflux disease without esophagitis: Secondary | ICD-10-CM | POA: Diagnosis present

## 2011-02-21 DIAGNOSIS — E039 Hypothyroidism, unspecified: Secondary | ICD-10-CM | POA: Diagnosis present

## 2011-02-21 DIAGNOSIS — G8929 Other chronic pain: Secondary | ICD-10-CM | POA: Diagnosis present

## 2011-02-21 DIAGNOSIS — R197 Diarrhea, unspecified: Secondary | ICD-10-CM

## 2011-02-21 DIAGNOSIS — E119 Type 2 diabetes mellitus without complications: Secondary | ICD-10-CM | POA: Diagnosis present

## 2011-02-21 DIAGNOSIS — F341 Dysthymic disorder: Secondary | ICD-10-CM | POA: Diagnosis present

## 2011-02-21 DIAGNOSIS — N390 Urinary tract infection, site not specified: Secondary | ICD-10-CM | POA: Diagnosis present

## 2011-02-21 DIAGNOSIS — I129 Hypertensive chronic kidney disease with stage 1 through stage 4 chronic kidney disease, or unspecified chronic kidney disease: Secondary | ICD-10-CM | POA: Diagnosis present

## 2011-02-21 DIAGNOSIS — E860 Dehydration: Secondary | ICD-10-CM | POA: Diagnosis present

## 2011-02-21 LAB — URINE MICROSCOPIC-ADD ON

## 2011-02-21 LAB — HEPATIC FUNCTION PANEL
ALT: 13 U/L (ref 0–35)
Albumin: 4.1 g/dL (ref 3.5–5.2)
Alkaline Phosphatase: 62 U/L (ref 39–117)
Bilirubin, Direct: 0.1 mg/dL (ref 0.0–0.3)
Total Protein: 7.2 g/dL (ref 6.0–8.3)

## 2011-02-21 LAB — CBC
HCT: 30.6 % — ABNORMAL LOW (ref 36.0–46.0)
Hemoglobin: 11.1 g/dL — ABNORMAL LOW (ref 12.0–15.0)
MCH: 31.7 pg (ref 26.0–34.0)
MCHC: 36.3 g/dL — ABNORMAL HIGH (ref 30.0–36.0)
RBC: 3.5 MIL/uL — ABNORMAL LOW (ref 3.87–5.11)

## 2011-02-21 LAB — DIFFERENTIAL
Lymphocytes Relative: 40 % (ref 12–46)
Monocytes Absolute: 0.8 10*3/uL (ref 0.1–1.0)
Monocytes Relative: 11 % (ref 3–12)
Neutro Abs: 3.1 10*3/uL (ref 1.7–7.7)
Neutrophils Relative %: 46 % (ref 43–77)

## 2011-02-21 LAB — CBC WITH DIFFERENTIAL/PLATELET
Eosinophils Relative: 2.7 % (ref 0.0–5.0)
Lymphocytes Relative: 35.8 % (ref 12.0–46.0)
Monocytes Absolute: 0.6 10*3/uL (ref 0.1–1.0)
Monocytes Relative: 9.1 % (ref 3.0–12.0)
Neutrophils Relative %: 51.1 % (ref 43.0–77.0)
Platelets: 267 10*3/uL (ref 150.0–400.0)
RBC: 3.4 Mil/uL — ABNORMAL LOW (ref 3.87–5.11)
WBC: 6.1 10*3/uL (ref 4.5–10.5)

## 2011-02-21 LAB — BASIC METABOLIC PANEL
BUN: 54 mg/dL — ABNORMAL HIGH (ref 6–23)
Calcium: 8.8 mg/dL (ref 8.4–10.5)
Creatinine, Ser: 4.8 mg/dL (ref 0.4–1.2)
GFR: 11.47 mL/min — CL (ref >60.00)

## 2011-02-21 LAB — URINALYSIS, ROUTINE W REFLEX MICROSCOPIC
Glucose, UA: NEGATIVE mg/dL
Hgb urine dipstick: NEGATIVE
Ketones, ur: NEGATIVE mg/dL
Protein, ur: 100 mg/dL — AB
pH: 5 (ref 5.0–8.0)

## 2011-02-21 NOTE — Telephone Encounter (Signed)
Spoke w/patient to inform her that Dr Jonny Ruiz needs her to go to the ED [patient requested [facility]] and receive fluids, due to her abnormal Creatinine lab results [kidney function] & diarrhea. Pt states that she will need to find transportation; informed Pt to call 911 for ambulance if she did not have another means of getting to the hospital. Pt understood & agreed. [facility] informed that Pt will be arriving to their location & paperwork faxed [phone]

## 2011-02-21 NOTE — Telephone Encounter (Signed)
Clydie Braun from Lab called with critical lab-creatinine 4.8

## 2011-02-22 LAB — BASIC METABOLIC PANEL
BUN: 54 mg/dL — ABNORMAL HIGH (ref 6–23)
BUN: 54 mg/dL — ABNORMAL HIGH (ref 6–23)
Chloride: 106 mEq/L (ref 96–112)
Chloride: 109 mEq/L (ref 96–112)
GFR calc Af Amer: 10 mL/min — ABNORMAL LOW (ref >60)
GFR calc Af Amer: 12 mL/min — ABNORMAL LOW (ref >60)
GFR calc non Af Amer: 9 mL/min — ABNORMAL LOW (ref >60)
GFR calc non Af Amer: 10 mL/min — ABNORMAL LOW (ref >60)
Potassium: 4.3 mEq/L (ref 3.5–5.1)
Potassium: 3.6 mEq/L (ref 3.5–5.1)
Sodium: 140 mEq/L (ref 135–145)
Sodium: 140 mEq/L (ref 135–145)

## 2011-02-22 LAB — URIC ACID: Uric Acid, Serum: 11 mg/dL — ABNORMAL HIGH (ref 2.4–7.0)

## 2011-02-22 LAB — LIPASE, BLOOD: Lipase: 51 U/L (ref 11–59)

## 2011-02-22 LAB — HEPATIC FUNCTION PANEL
AST: 13 U/L (ref 0–37)
Bilirubin, Direct: 0.1 mg/dL (ref 0.0–0.3)
Indirect Bilirubin: 0.1 mg/dL — ABNORMAL LOW (ref 0.3–0.9)
Total Bilirubin: 0.2 mg/dL — ABNORMAL LOW (ref 0.3–1.2)

## 2011-02-22 LAB — GLUCOSE, CAPILLARY: Glucose-Capillary: 107 mg/dL — ABNORMAL HIGH (ref 70–99)

## 2011-02-22 LAB — MAGNESIUM: Magnesium: 2.2 mg/dL (ref 1.5–2.5)

## 2011-02-22 LAB — PHOSPHORUS: Phosphorus: 4.9 mg/dL — ABNORMAL HIGH (ref 2.3–4.6)

## 2011-02-22 NOTE — H&P (Signed)
[name] [name], [name] [name]                 ACCOUNT NO.:  [id number]  MEDICAL RECORD NO.:  [id number]  LOCATION:  [facility]                         FACILITY:  [facility]  PHYSICIAN:  Homero Fellers, MD   DATE OF BIRTH:  Apr 13, 1941  DATE OF ADMISSION:  02/21/2011 DATE OF DISCHARGE:                             HISTORY & PHYSICAL   PRIMARY CARE PHYSICIAN:  Dr. Oliver Barre of [facility].  CHIEF COMPLAINT:  He presents to the emergency room for abnormal kidney function.  HISTORY OF PRESENT ILLNESS:  This is a 70 year old woman who had episodes of intermittent diarrhea for about 2-1/2 weeks which stopped over 5 days ago accompanied with nausea for p.o. intake.  She was treated with Flagyl which she has currently completed.  She also had some nausea and vomiting but all these resolved at this time.  She went to see her doctor earlier today and the labs drawn showed BUN and creatinine has increased more than at baseline from BUN of 35 and creatinine of 3.21 about 6 months ago to BUN of 54 and creatinine 4.8 today.  GFR is currently 11.4.  The patient otherwise feels fine.  She denies any chest pain, nausea, vomiting, diaphoresis, abdominal pain, or diarrhea.  Also denied any urinary symptoms.  She has known history of diabetes, but has not been taking any medication lately.  PAST MEDICAL HISTORY:  Diabetes mellitus type 2, high blood pressure, hypothyroidism, gout, depression, anxiety disorder, chronic back pain, hyperlipidemia, and  gastroesophageal reflux disease.  ALLERGIES:  None.  SOCIAL HISTORY:  Smokes about 4 cigarettes per day.  No alcohol or drug use.  FAMILY HISTORY:  Noncontributory.  REVIEW OF SYSTEMS:  Ten-point review of system is negative except as above.  PHYSICAL EXAMINATION:  VITAL SIGNS:  Blood pressure 131/77, pulse 80, respirations 19, and temperature 99.0. GENERAL:  The patient is comfortable in no distress. HEENT:  Pallor.  Extraocular muscles are intact.  Mouth  is moist. NECK:  Supple.  No JVD, adenopathy, or thyromegaly. CHEST:  Lungs are clear bilaterally to auscultation.  No wheezing.  No crackles. HEART:  S1 and S2.  No murmurs, rubs, or gallops. ABDOMEN:  Full, soft, and nontender.  Bowel sounds present.  No masses. EXTREMITIES:  No edema, clubbing, or cyanosis. NEUROLOGICAL:  No deficits.  LABORATORY DATA:  Urinalysis wbc 3-6, bacteria few, leukocyte esterase small.  White count in blood 6.8, hemoglobin 11.1, platelet count 252. Chemistry sodium is 142, potassium 4.2, BUN 54, creatinine 4.8, calcium 8.8.  Liver enzymes are grossly normal.  ASSESSMENT: 1. Acute on chronic kidney failure currently about stage IV.  Chronic     kidney disease likely acute injury precipitated likely from recent     diarrhea and vomiting.  It is also likely that the patient kidney     function has progressively gotten worse for the past several weeks     to months. 2. Mild urinary tract infection. 3. Diabetes mellitus currently stable. 4. Gout. 5. Tobacco abuse.  PLAN:  Avoid all nephrotoxic, give gentle IV fluids.  Check BMP tomorrow to see whether she will return to her baseline, placed on nicotine patch.  Check uric acid, phosphorus,  and magnesium.  The patient will benefit from Nephrology consultation either as an inpatient or outpatient, overall her condition is stable.  Meanwhile, I will give her ceftriaxone for urinary tract infection.  Urine cultures should be followed.     Homero Fellers, MD     FA/MEDQ  D:  02/21/2011  T:  02/22/2011  Job:  [id number]  Electronically Signed by Homero Fellers  on 02/22/2011 02:45:33 AM

## 2011-02-23 LAB — CBC
HCT: 27.2 % — ABNORMAL LOW (ref 36.0–46.0)
Hemoglobin: 9.7 g/dL — ABNORMAL LOW (ref 12.0–15.0)
MCHC: 35.7 g/dL (ref 30.0–36.0)
MCV: 88.6 fL (ref 78.0–100.0)
RDW: 15.3 % (ref 11.5–15.5)

## 2011-02-23 LAB — BASIC METABOLIC PANEL
Chloride: 113 mEq/L — ABNORMAL HIGH (ref 96–112)
GFR calc Af Amer: 15 mL/min — ABNORMAL LOW (ref >60)
GFR calc non Af Amer: 12 mL/min — ABNORMAL LOW (ref >60)
Glucose, Bld: 91 mg/dL (ref 70–99)
Potassium: 4.5 mEq/L (ref 3.5–5.1)
Sodium: 140 mEq/L (ref 135–145)

## 2011-02-23 LAB — GLUCOSE, CAPILLARY: Glucose-Capillary: 87 mg/dL (ref 70–99)

## 2011-02-23 LAB — PHOSPHORUS: Phosphorus: 4.8 mg/dL — ABNORMAL HIGH (ref 2.3–4.6)

## 2011-02-23 LAB — PROTIME-INR: INR: 1.09 (ref 0.00–1.49)

## 2011-02-24 LAB — GLUCOSE, CAPILLARY: Glucose-Capillary: 108 mg/dL — ABNORMAL HIGH (ref 70–99)

## 2011-02-24 LAB — BASIC METABOLIC PANEL
Calcium: 8.1 mg/dL — ABNORMAL LOW (ref 8.4–10.5)
GFR calc non Af Amer: 14 mL/min — ABNORMAL LOW (ref >60)
Glucose, Bld: 91 mg/dL (ref 70–99)
Sodium: 137 mEq/L (ref 135–145)

## 2011-02-24 LAB — URINE CULTURE: Colony Count: 75000

## 2011-02-26 NOTE — Discharge Summary (Signed)
  NAMELANORA, [name] [name]                 ACCOUNT NO.:  [id number]  MEDICAL RECORD NO.:  [id number]  LOCATION:                                 FACILITY:  PHYSICIAN:  Conley Canal, MD      DATE OF BIRTH:  [birth year]  DATE OF ADMISSION:  02/21/2011 DATE OF DISCHARGE:  02/24/2011                        DISCHARGE SUMMARY - REFERRING   PRIMARY CARE PHYSICIAN:  Dr. Oliver [facility]re with [facility].  NEPHROLOGIST:  Dr. Casimiro Needle.  DISCHARGE DIAGNOSES: 1. Acute-on-chronic kidney disease stage 4 secondary to dehydration     and possibly antibiotics. 2. Diabetes mellitus type 2. 3. Hypertension, controlled. 4. Hypothyroidism. 5. Gout. 6. Depression. 7. Anxiety disorder. 8. Chronic back pain. 9. Hyperlipidemia. 10.Gastroesophageal reflux disease. 11.Urinary tract infection.  DISCHARGE MEDICATIONS: 1. Ciprofloxacin 250 mg daily for three more days. 2. Allopurinol 100 mg daily. 3. Atorvastatin 40 mg daily. 4. Clonazepam 2 mg twice daily as needed. 5. Colchicine 0.6 mg daily. 6. Flexeril 5 mg three times daily as needed. 7. Cymbalta 60 mg daily. 8. Lasix as directed by the patient's nephrologist. 9. Glimepiride 1 mg a.c. breakfast. 10.Vicodin 7.5/325 mg q.6 h. as needed. 11.Hydroxyzine 25 mg every 6 hours as needed. 12.Synthroid 125 mcg as directed daily. 13.Pregabalin 50 mg nightly. 14.Promethazine 25 mg every 6 hours as needed. 15.Seroquel 100 mg nightly. 16.Sitagliptin 25 mg daily. 17.Spiriva 18 mcg inhalations daily. 18.Trazodone 100 mg nightly. 19.Tussionex twice daily as needed.  PROCEDURES PERFORMED:  None.  HOSPITAL COURSE:  [name] [name] is a very pleasant 70 year old female who came in with history of abnormal kidney function test.  The patient had been treated for GI infection with Flagyl after she had presented to her PCP with diarrhea for about 2-1/2 weeks.  At the time of admission, her BUN was noted to be 54 with a creatinine of 4.8 from a baseline creatinine  of 3.2.  She had suggestion of urinary tract infection, although her urine culture has shown no growth up to this point.  The patient was started on IV fluids and medications were adjusted for creatinine clearance and she improved gradually to have baseline creatinine of 3.23 and BUN 51.  CBC showed a white count of 6.5, hemoglobin 9.7, hematocrit 27, and platelet count 243.  Her other labs also include a sodium of 137, potassium 4.8, bicarbonate 16, and calcium 8.1.  The patient discharged on three more days of ciprofloxacin having received ceftriaxone IV for 2 days.  She is clinically better and should follow with Dr. Oliver Barre as well as Dr. Casimiro Needle as previously scheduled.  Time spent for discharge preparation less than 30 minutes.     Conley Canal, MD     SR/MEDQ  D:  02/24/2011  T:  02/24/2011  Job:  [id number]  cc:   Corwin Levins, MD Mindi Slicker Lowell Guitar, M.D.  Electronically Signed by Conley Canal  on 02/26/2011 02:51:20 PM

## 2011-02-27 ENCOUNTER — Other Ambulatory Visit: Payer: Self-pay

## 2011-02-27 MED ORDER — ALLOPURINOL 100 MG PO TABS: 100.0000 mg | ORAL_TABLET | Freq: Every day | ORAL | Status: DC

## 2011-02-27 MED ORDER — COLCHICINE 0.6 MG PO TABS: 0.6000 mg | ORAL_TABLET | Freq: Every day | ORAL | Status: DC

## 2011-03-09 ENCOUNTER — Telehealth: Payer: Self-pay | Admitting: *Deleted

## 2011-03-09 ENCOUNTER — Telehealth: Payer: Self-pay

## 2011-03-09 NOTE — Telephone Encounter (Signed)
Samples not available.

## 2011-03-09 NOTE — Telephone Encounter (Signed)
Patient picked up her Lipitor from patient assistance 03/09/2011. Also requested samples of her Cymbalta 60 mg and Lyrica 50 mg. Patient received 2 sample boxed of cymbalta mg 60 and 4 sample boxes of Lyrica 50 mg.

## 2011-03-09 NOTE — Telephone Encounter (Signed)
Patient requesting samples of seroquel, lyrica & cymbala if avail. (she is coming in around 1 pm today to pick up her lipitor)

## 2011-04-03 ENCOUNTER — Telehealth: Payer: Self-pay | Admitting: *Deleted

## 2011-04-03 NOTE — Telephone Encounter (Signed)
Please forward to latoya to re-do the pt assist application

## 2011-04-03 NOTE — Telephone Encounter (Signed)
Patient informed she has always taken Lyrica 50 mg 2 by mouth per day. She received through patient assistance #[id number] lyrica and said that she would run out. Please advise

## 2011-04-03 NOTE — Telephone Encounter (Signed)
Patient requesting a call back regarding her lyrica.

## 2011-04-04 ENCOUNTER — Other Ambulatory Visit: Payer: Self-pay | Admitting: Nephrology

## 2011-04-04 ENCOUNTER — Encounter (HOSPITAL_COMMUNITY): Payer: Medicare Other | Attending: Nephrology

## 2011-04-04 DIAGNOSIS — I129 Hypertensive chronic kidney disease with stage 1 through stage 4 chronic kidney disease, or unspecified chronic kidney disease: Secondary | ICD-10-CM | POA: Insufficient documentation

## 2011-04-04 DIAGNOSIS — D649 Anemia, unspecified: Secondary | ICD-10-CM | POA: Insufficient documentation

## 2011-04-04 DIAGNOSIS — N184 Chronic kidney disease, stage 4 (severe): Secondary | ICD-10-CM | POA: Insufficient documentation

## 2011-04-05 ENCOUNTER — Other Ambulatory Visit: Payer: Self-pay | Admitting: Internal Medicine

## 2011-04-05 MED ORDER — PREGABALIN 50 MG PO CAPS: 50.0000 mg | ORAL_CAPSULE | Freq: Two times a day (BID) | ORAL | Status: DC

## 2011-04-06 ENCOUNTER — Other Ambulatory Visit: Payer: Self-pay | Admitting: Internal Medicine

## 2011-04-18 ENCOUNTER — Encounter (HOSPITAL_COMMUNITY): Payer: Medicare Other | Attending: Nephrology

## 2011-04-18 ENCOUNTER — Other Ambulatory Visit: Payer: Self-pay | Admitting: Nephrology

## 2011-04-18 DIAGNOSIS — I129 Hypertensive chronic kidney disease with stage 1 through stage 4 chronic kidney disease, or unspecified chronic kidney disease: Secondary | ICD-10-CM | POA: Insufficient documentation

## 2011-04-18 DIAGNOSIS — D649 Anemia, unspecified: Secondary | ICD-10-CM | POA: Insufficient documentation

## 2011-04-18 DIAGNOSIS — N184 Chronic kidney disease, stage 4 (severe): Secondary | ICD-10-CM | POA: Insufficient documentation

## 2011-04-20 ENCOUNTER — Ambulatory Visit (INDEPENDENT_AMBULATORY_CARE_PROVIDER_SITE_OTHER): Payer: Medicare Other | Admitting: Internal Medicine

## 2011-04-20 ENCOUNTER — Encounter: Payer: Self-pay | Admitting: Internal Medicine

## 2011-04-20 DIAGNOSIS — M25569 Pain in unspecified knee: Secondary | ICD-10-CM

## 2011-04-20 DIAGNOSIS — I10 Essential (primary) hypertension: Secondary | ICD-10-CM

## 2011-04-20 DIAGNOSIS — M25561 Pain in right knee: Secondary | ICD-10-CM

## 2011-04-20 DIAGNOSIS — G8929 Other chronic pain: Secondary | ICD-10-CM | POA: Insufficient documentation

## 2011-04-20 DIAGNOSIS — R21 Rash and other nonspecific skin eruption: Secondary | ICD-10-CM | POA: Insufficient documentation

## 2011-04-20 DIAGNOSIS — J449 Chronic obstructive pulmonary disease, unspecified: Secondary | ICD-10-CM

## 2011-04-20 MED ORDER — FERROUS SULFATE 325 (65 FE) MG PO TABS: ORAL_TABLET | ORAL | Status: DC

## 2011-04-20 MED ORDER — TRIAMCINOLONE ACETONIDE 0.1 % EX CREA: TOPICAL_CREAM | Freq: Two times a day (BID) | CUTANEOUS | Status: DC

## 2011-04-20 MED ORDER — ALLOPURINOL 100 MG PO TABS: 100.0000 mg | ORAL_TABLET | Freq: Every day | ORAL | Status: DC

## 2011-04-20 NOTE — Assessment & Plan Note (Signed)
C/w mild dermatitis - ? Contact like - for triam cr asd prn,  to f/u any worsening symptoms or concerns

## 2011-04-20 NOTE — Assessment & Plan Note (Signed)
stable overall by hx and exam, most recent data reviewed with pt, and pt to continue medical treatment as before  BP Readings from Last 3 Encounters:  04/20/11 142/72  02/16/11 126/72  12/13/10 142/92

## 2011-04-20 NOTE — Assessment & Plan Note (Signed)
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

## 2011-04-20 NOTE — Assessment & Plan Note (Signed)
stable overall by hx and exam, most recent data reviewed with pt, and pt to continue medical treatment as before  SpO2 Readings from Last 3 Encounters:  04/20/11 97%  02/16/11 98%  12/13/10 97%

## 2011-04-20 NOTE — Patient Instructions (Addendum)
Take all new medications as prescribed - the cream (sent to the pharmacy) Continue all other medications as before Your SCAT form was filled out today Please keep your appointments with your specialists as you have planned, and the shots at short stay for the anemia Your allopurinol was refilled to the pharmacy on the computer

## 2011-04-20 NOTE — Progress Notes (Signed)
Subjective:    Patient ID: [name] [name], female    DOB: 01/19/1941, 70 y.o.   MRN: [id number]  HPIHere to f/u; overall doing ok,  Pt denies chest pain, increased sob or doe, wheezing, orthopnea, PND, increased LE swelling, palpitations, dizziness or syncope.  Pt denies new neurological symptoms such as new headache, or facial or extremity weakness or numbness   Pt denies polydipsia, polyuria, or low sugar symptoms such as weakness or confusion improved with po intake.  Pt states overall good compliance with meds, trying to follow lower cholesterol, diabetic diet, wt overall stable but little exercise however.  Was dx with worsening anemia recently, now on iron sulfate 325 - 2 per day.  ALso for epo -like injections every 2 wks until improved per Dr Lowell Guitar.  Does have need for SCAT transporation services based on her impairments today.  Not taking the colchicine due to cost,  And needs refill for allopurinol.  No recent gout symptoms.  Has severe bilat knee DJD per pt, chronic pain, needs lift chair rx due to marked problem with getting up from chair - Dr Waldron Labs provided rx for tens unit.  Getting PT for the left rotater cuff, but often gets there late and seems to get the rough tx per pt when she takes the city bus.   Cant walk a block due to back and knee pain. Due to see Dr Lajoyce Corners again in 6 days. Today also with a rash to medial left distal leg just above the ankle that seemed to start with a "mole or freckle" but it itched and she has scratched, now with 1/2 cm superfic lesion with surrounding induration, but no pain, red streaks, fever.   Reminds me today she also sees Dr Fayne Norrie, who recently checked sugar/chol, no results today. Past Medical History  Diagnosis Date  . HYPOTHYROIDISM 02/17/2007  . DIABETES MELLITUS, TYPE II 02/17/2007  . HYPERLIPIDEMIA 02/17/2007  . GOUT 05/29/2007  . ANEMIA-NOS 05/29/2007  . ANXIETY 03/23/2010  . 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 DEPRESSION 02/17/2007  . RESTLESS  LEG SYNDROME 05/29/2007  . COMMON MIGRAINE 05/29/2007  . PERIPHERAL NEUROPATHY 05/29/2007  . HYPERTENSION 02/17/2007  . PERIPHERAL VASCULAR DISEASE 02/17/2007  . ASTHMATIC BRONCHITIS, ACUTE 10/08/2008  . COPD 05/29/2007  . GERD 02/17/2007  . PEPTIC ULCER DISEASE 05/29/2007  . GASTROENTERITIS 12/02/2007  . RENAL INSUFFICIENCY 02/17/2007  . MENOPAUSAL DISORDER 05/25/2009  . Osteoarth NOS-L/Leg 03/11/2008  . SHOULDER PAIN, LEFT, CHRONIC 09/15/2008  . CERVICAL RADICULOPATHY, LEFT 05/27/2008  . BACK PAIN 12/12/2009  . OSTEOPENIA 09/22/2009  . SEIZURE DISORDER 02/17/2007  . HYPERSOMNIA 05/26/2010  . Memory loss 01/24/2010  . Abnormality of gait 07/30/2007  . ABDOMINAL PAIN -GENERALIZED 11/16/2009  . Personal history of colonic polyps 11/16/2009  . PERSISTENT DISORDER INITIATING/MAINTAINING SLEEP 08/22/2010  . INADEQUATE SLEEP HYGIENE 08/22/2010  . DYSPNEA 08/22/2010  . Wheezing 08/15/2010  . CHEST PAIN 08/15/2010  . CHRONIC OBSTRUCTIVE PULMONARY DISEASE, ACUTE EXACERBATION 09/08/2010  . Palpitations 09/08/2010  . Other diseases of vocal cords 09/19/2010  . HEART MURMUR, HX OF 10/02/2010  . 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 Chronic neck pain 12/13/2010  . Osteopenia 12/13/2010   Past Surgical History  Procedure Date  . Left toe amputated 2006  . Bunionectomy 1980  . Goiter removal 1997  . Stress cardiolite 06/18/2006  . Tranthoracic echocardiogram 06/18/2006  . Electrocardiogram 05/29/2006  . Cholecystectomy   . Rotator cuff repair Left    Dr. Lajoyce Corners    reports that she  has been smoking.  She does not have any smokeless tobacco history on file. She reports that she does not drink alcohol or use illicit drugs. family history includes Coronary artery disease in her other; Dementia in her mother; Hyperlipidemia in her other; Hypertension in her other; Ovarian cancer in her other; and Stroke in her other. Allergies  Allergen Reactions  . Nsaids     REACTION: renal dysfunction  . Pioglitazone     REACTION: EDEMA    Current Outpatient Prescriptions on File Prior to Visit  Medication Sig Dispense Refill  . atorvastatin (LIPITOR) 40 MG tablet Take 1 tablet (40 mg total) by mouth daily.  90 tablet  3  . chlorpheniramine-hydrocodone (TUSSIONEX PENNKINETIC ER) 10-8 MG/5ML LQCR Take 5 mLs by mouth. 1 tsp. By mouth two times a day as needed for cough       . clonazePAM (KLONOPIN) 2 MG tablet TAKE ONE TABLET BY MOUTH TWICE DAILY AS NEEDED  60 tablet  1  . cyclobenzaprine (FLEXERIL) 5 MG tablet Take 1 tablet (5 mg total) by mouth 3 (three) times daily as needed.  90 tablet  3  . DULoxetine (CYMBALTA) 60 MG capsule Take 1 capsule (60 mg total) by mouth daily.  90 capsule  3  . furosemide (LASIX) 80 MG tablet Take 80 mg by mouth 3 (three) times daily.        Marland Kitchen glimepiride (AMARYL) 1 MG tablet Take 1 mg by mouth every morning before breakfast. 1 po every day       . HYDROcodone-acetaminophen (NORCO) 7.5-325 MG per tablet Take 1 tablet by mouth every 6 (six) hours as needed for pain. 1 by mouth four times per day as needed for pain  60 tablet  2  . hydrOXYzine (ATARAX) 25 MG tablet TAKE ONE TABLET BY MOUTH EVERY 6 HOURS AS NEEDED FOR NAUSEA OR NERVES  60 tablet  2  . levothyroxine (SYNTHROID, LEVOTHROID) 125 MCG tablet Take 1 tablet (125 mcg total) by mouth daily.  90 tablet  3  . pregabalin (LYRICA) 50 MG capsule Take 1 capsule (50 mg total) by mouth 2 (two) times daily.  180 capsule  3  . promethazine (PHENERGAN) 25 MG tablet Take 1 tablet (25 mg total) by mouth every 6 (six) hours as needed.  60 tablet  1  . QUEtiapine (SEROQUEL) 100 MG tablet Take 100 mg by mouth at bedtime.        . sitaGLIPtan (JANUVIA) 100 MG tablet Take 100 mg by mouth daily. 1/4 by mouth once daily       . tiotropium (SPIRIVA) 18 MCG inhalation capsule Place 18 mcg into inhaler and inhale daily.        . traZODone (DESYREL) 100 MG tablet TAKE ONE TABLET BY MOUTH AT BEDTIME AS NEEDED  90 tablet  1   Review of Systems Review of Systems    Constitutional: Negative for diaphoresis and unexpected weight change.  HENT: Negative for drooling and tinnitus.   Eyes: Negative for photophobia and visual disturbance.  Respiratory: Negative for choking and stridor.   Gastrointestinal: Negative for vomiting and blood in stool.  Genitourinary: Negative for hematuria and decreased urine volume.    Objective:   Physical Exam BP 142/72  Pulse 85  Temp(Src) 98.6 F (37 C) (Oral)  Ht 5\' 7"  (1.702 m)  Wt 178 lb 6 oz (80.91 kg)  BMI 27.94 kg/m2  SpO2 97% Physical Exam  VS noted Constitutional: Pt appears well-developed and well-nourished.  HENT: Head:  Normocephalic.  Right Ear: External ear normal.  Left Ear: External ear normal.  Eyes: Conjunctivae and EOM are normal. Pupils are equal, round, and reactive to light.  Neck: Normal range of motion. Neck supple.  Cardiovascular: Normal rate and regular rhythm.   Pulmonary/Chest: Effort normal and breath sounds normal.  Neurological: Pt is alert. No cranial nerve deficit.  Skin: Skin is warm. No erythema. except for 2 cm area left distal medial leg with dermatitis with central open lesion superficial, nontender, somewhat weepy edges Psychiatric: Pt behavior is normal. Thought content normal.  No active synovitis, knees with significant bilat crepitus, decreased ROM, left shoulder not examined Spine nontender today, but has some right lower lumbar paravetebral tender       Assessment & Plan:

## 2011-04-23 ENCOUNTER — Telehealth: Payer: Self-pay

## 2011-04-23 NOTE — Telephone Encounter (Signed)
Patient requesting a handicap sticker to be mailed to her home. Completed and gave the MD to sign, will call patient to inform will mail.

## 2011-05-02 ENCOUNTER — Encounter (HOSPITAL_COMMUNITY): Payer: Medicare Other

## 2011-05-02 ENCOUNTER — Other Ambulatory Visit: Payer: Self-pay | Admitting: Nephrology

## 2011-05-09 ENCOUNTER — Other Ambulatory Visit: Payer: Self-pay | Admitting: Internal Medicine

## 2011-05-09 NOTE — Telephone Encounter (Signed)
Done hardcopy to robin  

## 2011-05-10 NOTE — Telephone Encounter (Signed)
Faxed hardcopy to [facility] [phone]

## 2011-05-18 ENCOUNTER — Encounter (HOSPITAL_COMMUNITY)
Admission: RE | Admit: 2011-05-18 | Discharge: 2011-05-18 | Disposition: A | Discharge status: Home / Self Care | Payer: Medicare Other | Source: Ambulatory Visit | Attending: Nephrology | Admitting: Nephrology

## 2011-05-18 ENCOUNTER — Telehealth: Payer: Self-pay | Admitting: Pulmonary Disease

## 2011-05-18 ENCOUNTER — Other Ambulatory Visit: Payer: Self-pay | Admitting: Nephrology

## 2011-05-18 DIAGNOSIS — I129 Hypertensive chronic kidney disease with stage 1 through stage 4 chronic kidney disease, or unspecified chronic kidney disease: Secondary | ICD-10-CM | POA: Insufficient documentation

## 2011-05-18 DIAGNOSIS — D649 Anemia, unspecified: Secondary | ICD-10-CM | POA: Insufficient documentation

## 2011-05-18 DIAGNOSIS — N184 Chronic kidney disease, stage 4 (severe): Secondary | ICD-10-CM | POA: Insufficient documentation

## 2011-05-18 LAB — IRON AND TIBC
Saturation Ratios: 15 % — ABNORMAL LOW (ref 20–55)
UIBC: 267 ug/dL (ref 125–400)

## 2011-05-18 LAB — POCT HEMOGLOBIN-HEMACUE: Hemoglobin: 11.4 g/dL — ABNORMAL LOW (ref 12.0–15.0)

## 2011-05-18 MED ORDER — AZITHROMYCIN 250 MG PO TABS: ORAL_TABLET | ORAL | Status: AC

## 2011-05-18 NOTE — Telephone Encounter (Signed)
I spoke with pt and she c/o bronchitis x 1 week. Pt states she has been having some chest congestion, hoarseness, loss of voice, runny nose, nasal congestion. Pt denies any cough, wheezing, chest tightness, fever. I offered pt OV but refused and states she does not have transportation to get here. Pt last OV was 09/19/10 and no F/U pending. Pt is requesting Dr. Armen Pickup. Please advise, thanks  Allergies  Allergen Reactions  . Nsaids     REACTION: renal dysfunction  . Pioglitazone     REACTION: EDEMA     Carver Fila, CMA

## 2011-05-18 NOTE — Telephone Encounter (Signed)
Can send in zpak for possible bronchitis Use tylenol cold and sinus for head congestion.

## 2011-05-18 NOTE — Telephone Encounter (Signed)
Called and spoke with pt.  Informed her of KC's recs.  Rx sent to pharmacy.

## 2011-05-21 LAB — CBC
HCT: 39.2
Hemoglobin: 12.9
MCHC: 32.9
MCV: 93.1
RBC: 4.21

## 2011-05-21 LAB — COMPREHENSIVE METABOLIC PANEL
BUN: 48 — ABNORMAL HIGH
CO2: 32
Calcium: 9.6
Creatinine, Ser: 3.61 — ABNORMAL HIGH
GFR calc Af Amer: 15 — ABNORMAL LOW
GFR calc non Af Amer: 13 — ABNORMAL LOW
Glucose, Bld: 113 — ABNORMAL HIGH

## 2011-05-21 LAB — URINALYSIS, ROUTINE W REFLEX MICROSCOPIC
Hgb urine dipstick: NEGATIVE
Ketones, ur: NEGATIVE
Protein, ur: NEGATIVE
Urobilinogen, UA: 0.2

## 2011-05-21 LAB — BASIC METABOLIC PANEL
CO2: 34 — ABNORMAL HIGH
Calcium: 9.1
Chloride: 98
Glucose, Bld: 118 — ABNORMAL HIGH
Sodium: 144

## 2011-05-21 LAB — URINE CULTURE: Colony Count: >=100000

## 2011-05-21 LAB — DIFFERENTIAL
Lymphocytes Relative: 34
Lymphs Abs: 2.1
Neutrophils Relative %: 54

## 2011-05-21 LAB — CULTURE, BLOOD (ROUTINE X 2)

## 2011-05-21 LAB — HEMOGLOBIN A1C: Hgb A1c MFr Bld: 6.4 — ABNORMAL HIGH

## 2011-05-21 LAB — D-DIMER, QUANTITATIVE: D-Dimer, Quant: 2.45 — ABNORMAL HIGH

## 2011-05-21 LAB — URINE MICROSCOPIC-ADD ON

## 2011-05-23 ENCOUNTER — Telehealth: Payer: Self-pay | Admitting: Pulmonary Disease

## 2011-05-23 LAB — RENAL FUNCTION PANEL
Albumin: 3.8
BUN: 82 — ABNORMAL HIGH
CO2: 26
Chloride: 100
Creatinine, Ser: 3.18 — ABNORMAL HIGH
GFR calc non Af Amer: 15 — ABNORMAL LOW
Potassium: 3.6

## 2011-05-23 LAB — CBC
HCT: 37.4
HCT: 39.2
Hemoglobin: 12.5
MCV: 92.4
Platelets: 256
RBC: 4.09
RBC: 4.24
WBC: 7.3

## 2011-05-23 LAB — BASIC METABOLIC PANEL
BUN: 82 — ABNORMAL HIGH
Chloride: 97
GFR calc Af Amer: 15 — ABNORMAL LOW
GFR calc non Af Amer: 13 — ABNORMAL LOW
Potassium: 3.5
Sodium: 135

## 2011-05-23 NOTE — Telephone Encounter (Signed)
Called and spoke with pt.  Informed her of KC's recs.  Pt verbalized understanding and denied any questions.

## 2011-05-23 NOTE — Telephone Encounter (Signed)
Pt finished Zpak today but states she is not feeling any better. She never started Tylenol Cold and Sinus as recommended. She is very hoarse, has a runny nose and occasional cough. She denies any sob, wheezing or f/c/s, no chest tightness. She wants to know if [facility] has any other recs for her of if she needs another abx. Any rx can be called to [facility] on [address]. Pt does not want to come in for an appt. Pls advise.

## 2011-05-23 NOTE — Telephone Encounter (Signed)
She needs to take the tylenol cold and sinus if she wants symptom relief.

## 2011-05-25 ENCOUNTER — Encounter (HOSPITAL_COMMUNITY): Payer: Medicare Other

## 2011-05-25 ENCOUNTER — Other Ambulatory Visit: Payer: Self-pay | Admitting: Nephrology

## 2011-05-29 ENCOUNTER — Telehealth: Payer: Self-pay | Admitting: Pulmonary Disease

## 2011-05-29 MED ORDER — DIPHENHYD-HYDROCORT-NYSTATIN MT SUSP: OROMUCOSAL | Status: DC

## 2011-05-29 NOTE — Telephone Encounter (Signed)
Per Dr. Kriste Basque, call in MMW, 4 oz, swish and swallow four times daily, no refills. Pt also needs to schedule a f/u with KC. Pt is aware of SN recs and this will be called to [facility] on [address] Per pt request. She states she will be out of town caring for her elderly mother from Sat., 10/27 through Sat., 12/15. She has scheduled a f/u appt with [facility] for 12/21 @ 1:45pm.

## 2011-05-29 NOTE — Telephone Encounter (Signed)
Called, spoke with pt.  Pt is requesting something be called in for the hoarseness.  I advised pt she was last seen by Dr. Shelle Iron in Feb 2012 and he has already tried to treat her over the phone so she needs to come in to be evaluated.  I offered pt OV tomorrow at 9am with Dr. Sherene Sires, and 2 OV's on Thursday with TP and MR.  Pt states she does not know when she can come in because she doesn't know when her niece will be able to bring her so she could not schedule an OV. I advised pt that I would send pt to on call dr per her request but I could not guarantee that something would be called in.  She verbalized understanding of this.  Dr. Kriste Basque, pls advise.  Thanks!

## 2011-05-29 NOTE — Telephone Encounter (Signed)
Returning call.

## 2011-05-29 NOTE — Telephone Encounter (Signed)
I spoke with pt and she states her bronchitis is getting worse. Pt c/o cough, hoarseness x 3 weeks. Pt stated she did not want anything called in for her but requested to be seen. I offered OV with TP today but stated she could not get here today, I offered OV with VS tomorrow but stated she had to go to short stay for an iron infusion. I offered OV for Thursday with MR but stated she will have to call back when she can come in. Will await pt call back

## 2011-05-30 ENCOUNTER — Other Ambulatory Visit: Payer: Self-pay | Admitting: Nephrology

## 2011-05-30 ENCOUNTER — Telehealth: Payer: Self-pay

## 2011-05-30 ENCOUNTER — Encounter (HOSPITAL_COMMUNITY)
Admission: RE | Admit: 2011-05-30 | Discharge: 2011-05-30 | Payer: Medicare Other | Source: Ambulatory Visit | Attending: Nephrology | Admitting: Nephrology

## 2011-05-30 LAB — POCT HEMOGLOBIN-HEMACUE: Hemoglobin: 12.6 g/dL (ref 12.0–15.0)

## 2011-05-30 MED ORDER — ATORVASTATIN CALCIUM 40 MG PO TABS: 40.0000 mg | ORAL_TABLET | Freq: Every day | ORAL | Status: DC

## 2011-05-30 NOTE — Telephone Encounter (Signed)
Called [employer] and ordered the patients Lipitor [phone] and order number is [id number]). Called the patient informed will take 7-10 days to come in. The patient is going to [place] on Saturday and will return not return until December. She will pickup order when she returns home. Please send a prescription to [facility] generic Lipitor for this patient to cover her until she returns from her trip.

## 2011-05-30 NOTE — Telephone Encounter (Signed)
Patient called requesting if her Lipitor had been sent in from [facility] yet and wanting samples of spiriva. We had no samples will call [employer] to check what patient needs to do to get her lipitor or if has been recently sent.

## 2011-05-30 NOTE — Telephone Encounter (Signed)
Done per emr 

## 2011-05-31 ENCOUNTER — Other Ambulatory Visit: Payer: Self-pay

## 2011-05-31 ENCOUNTER — Ambulatory Visit: Payer: Medicare Other | Admitting: Adult Health

## 2011-05-31 MED ORDER — HYDROCODONE-ACETAMINOPHEN 7.5-325 MG PO TABS: 1.0000 | ORAL_TABLET | Freq: Four times a day (QID) | ORAL | Status: DC | PRN

## 2011-05-31 NOTE — Telephone Encounter (Signed)
Done hardcopy to robin  

## 2011-05-31 NOTE — Telephone Encounter (Signed)
Faxed prescription to [facility] as requested by patient.

## 2011-06-01 NOTE — Telephone Encounter (Signed)
Faxed hardcopy to pharmacy. 

## 2011-06-05 ENCOUNTER — Telehealth: Payer: Self-pay

## 2011-06-05 NOTE — Telephone Encounter (Signed)
Ok to print query and fax to them with ok for hydrocodone refill  Let me know if this is not helpful

## 2011-06-05 NOTE — Telephone Encounter (Signed)
Patient is in [place] taking care of her mother. She informed [facility] would not fill/transfer her hydrocodone. Please advise, the [facility] in [place] is [address] [place]

## 2011-06-06 NOTE — Telephone Encounter (Signed)
Do not know how to do query, please advise

## 2011-06-06 NOTE — Telephone Encounter (Signed)
Faxed query to pharmacy in [place] at [phone]

## 2011-06-13 ENCOUNTER — Ambulatory Visit: Payer: Medicare Other | Admitting: Internal Medicine

## 2011-07-25 ENCOUNTER — Encounter (HOSPITAL_COMMUNITY): Payer: Medicare Other

## 2011-07-27 ENCOUNTER — Telehealth: Payer: Self-pay

## 2011-07-27 ENCOUNTER — Ambulatory Visit: Payer: Medicare Other | Admitting: Pulmonary Disease

## 2011-07-27 DIAGNOSIS — F329 Major depressive disorder, single episode, unspecified: Secondary | ICD-10-CM

## 2011-07-27 NOTE — Telephone Encounter (Signed)
I though she had a psychiatrist, and if not does she mean a private psychiatrist?  If she is not able to afford, she should go to [facility]

## 2011-07-27 NOTE — Telephone Encounter (Signed)
Pt called requesting referral to psychiatry.

## 2011-07-30 NOTE — Telephone Encounter (Signed)
Done per emr 

## 2011-07-30 NOTE — Telephone Encounter (Signed)
Called the patient left message to call back 

## 2011-07-30 NOTE — Telephone Encounter (Signed)
She would like to go back to [facility] [address].

## 2011-08-10 ENCOUNTER — Encounter: Payer: Self-pay | Admitting: Internal Medicine

## 2011-08-10 ENCOUNTER — Ambulatory Visit (INDEPENDENT_AMBULATORY_CARE_PROVIDER_SITE_OTHER): Payer: Medicare Other | Admitting: Internal Medicine

## 2011-08-10 VITALS — BP 140/90 | HR 93 | Temp 98.5°F | Ht 67.0 in | Wt 186.5 lb

## 2011-08-10 DIAGNOSIS — F411 Generalized anxiety disorder: Secondary | ICD-10-CM

## 2011-08-10 DIAGNOSIS — J449 Chronic obstructive pulmonary disease, unspecified: Secondary | ICD-10-CM

## 2011-08-10 DIAGNOSIS — E119 Type 2 diabetes mellitus without complications: Secondary | ICD-10-CM

## 2011-08-10 MED ORDER — CLONAZEPAM 2 MG PO TABS: 2.0000 mg | ORAL_TABLET | Freq: Two times a day (BID) | ORAL | Status: DC | PRN

## 2011-08-10 MED ORDER — HYDROCODONE-ACETAMINOPHEN 5-500 MG PO TABS: 1.0000 | ORAL_TABLET | Freq: Three times a day (TID) | ORAL | Status: AC | PRN

## 2011-08-10 MED ORDER — QUETIAPINE FUMARATE 100 MG PO TABS: 100.0000 mg | ORAL_TABLET | Freq: Every day | ORAL | Status: DC

## 2011-08-10 MED ORDER — DULOXETINE HCL 60 MG PO CPEP: 60.0000 mg | ORAL_CAPSULE | Freq: Every day | ORAL | Status: DC

## 2011-08-10 MED ORDER — CYCLOBENZAPRINE HCL 5 MG PO TABS: 5.0000 mg | ORAL_TABLET | Freq: Three times a day (TID) | ORAL | Status: DC | PRN

## 2011-08-10 NOTE — Progress Notes (Signed)
Subjective:    Patient ID: [name] [name], female    DOB: 1940-11-04, 71 y.o.   MRN: [id number]  HPI  Here after being out of town taking care of her mother in [place] - while there saw ENT with sinusitis - tx with antihist/PPI/flonase and antibx now gone and now improved.  Also with recurring left shoulder pain and rec'd for shoulder replacement but she is putting this off for now with Dr Lajoyce Corners, getting PT instead for now.  Also with ongoing anxiety and mult stressors, and states "I have so much on me I'm having memory problem."  Also saw renal in [place] who rec'd getting the shunt, but she is holding on that for now as well, wanted to f/u with renal here, has appt jan 17, and mentions plans for what sounds like iron transfusion and/or epoeitin.  Insurance changed Aug 07, 2011.  Has been referred to [facility], but after we referred her, she found out it was $150 to make the appt, so she could not do it.  Was referred to 2 doctors after calling Medicare but could not communicate with them, for some reason.  Reqeusts samples today, hard to afford meds, not taking the cymbalta which she was also taking for neuropathic pain. Has also recurrent cramps to legs, and occasionally hands as well. Not taking the flexeril recently, currently out. Pt denies chest pain, increased sob or doe, wheezing, orthopnea, PND, increased LE swelling, palpitations, dizziness or syncope.  Pt denies new neurological symptoms such as new headache, or facial or extremity weakness or numbness   Pt denies polydipsia, polyuria Past Medical History  Diagnosis Date  . HYPOTHYROIDISM 02/17/2007  . DIABETES MELLITUS, TYPE II 02/17/2007  . HYPERLIPIDEMIA 02/17/2007  . GOUT 05/29/2007  . ANEMIA-NOS 05/29/2007  . ANXIETY 03/23/2010  . 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 DEPRESSION 02/17/2007  . RESTLESS LEG SYNDROME 05/29/2007  . COMMON MIGRAINE 05/29/2007  . PERIPHERAL NEUROPATHY 05/29/2007  . HYPERTENSION 02/17/2007  . PERIPHERAL VASCULAR  DISEASE 02/17/2007  . ASTHMATIC BRONCHITIS, ACUTE 10/08/2008  . COPD 05/29/2007  . GERD 02/17/2007  . PEPTIC ULCER DISEASE 05/29/2007  . GASTROENTERITIS 12/02/2007  . RENAL INSUFFICIENCY 02/17/2007  . MENOPAUSAL DISORDER 05/25/2009  . Osteoarth NOS-L/Leg 03/11/2008  . SHOULDER PAIN, LEFT, CHRONIC 09/15/2008  . CERVICAL RADICULOPATHY, LEFT 05/27/2008  . BACK PAIN 12/12/2009  . OSTEOPENIA 09/22/2009  . SEIZURE DISORDER 02/17/2007  . HYPERSOMNIA 05/26/2010  . Memory loss 01/24/2010  . Abnormality of gait 07/30/2007  . ABDOMINAL PAIN -GENERALIZED 11/16/2009  . Personal history of colonic polyps 11/16/2009  . PERSISTENT DISORDER INITIATING/MAINTAINING SLEEP 08/22/2010  . INADEQUATE SLEEP HYGIENE 08/22/2010  . DYSPNEA 08/22/2010  . Wheezing 08/15/2010  . CHEST PAIN 08/15/2010  . CHRONIC OBSTRUCTIVE PULMONARY DISEASE, ACUTE EXACERBATION 09/08/2010  . Palpitations 09/08/2010  . Other diseases of vocal cords 09/19/2010  . HEART MURMUR, HX OF 10/02/2010  . 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 Chronic neck pain 12/13/2010  . Osteopenia 12/13/2010   Past Surgical History  Procedure Date  . Left toe amputated 2006  . Bunionectomy 1980  . Goiter removal 1997  . Stress cardiolite 06/18/2006  . Tranthoracic echocardiogram 06/18/2006  . Electrocardiogram 05/29/2006  . Cholecystectomy   . Rotator cuff repair Left    Dr. Lajoyce Corners    reports that she has been smoking.  She does not have any smokeless tobacco history on file. She reports that she does not drink alcohol or use illicit drugs. family history includes Coronary artery disease in  her other; Dementia in her mother; Hyperlipidemia in her other; Hypertension in her other; Ovarian cancer in her other; and Stroke in her other. Allergies  Allergen Reactions  . Nsaids     REACTION: renal dysfunction  . Pioglitazone     REACTION: EDEMA   Current Outpatient Prescriptions on File Prior to Visit  Medication Sig Dispense Refill  . allopurinol (ZYLOPRIM) 100 MG tablet Take 1  tablet (100 mg total) by mouth daily.  90 tablet  3  . atorvastatin (LIPITOR) 40 MG tablet Take 1 tablet (40 mg total) by mouth daily.  30 tablet  11  . clonazePAM (KLONOPIN) 2 MG tablet TAKE ONE TABLET BY MOUTH TWICE DAILY AS NEEDED  60 tablet  1  . Diphenhyd-Hydrocort-Nystatin SUSP Swish and swallow 1 teaspoon four times daily  4 oz  0  . ferrous sulfate 325 (65 FE) MG tablet 1 tab by mouth twice per day  30 tablet  11  . furosemide (LASIX) 80 MG tablet Take 80 mg by mouth 3 (three) times daily.        Marland Kitchen glimepiride (AMARYL) 1 MG tablet Take 1 mg by mouth every morning before breakfast. 1 po every day       . hydrOXYzine (ATARAX) 25 MG tablet TAKE ONE TABLET BY MOUTH EVERY 6 HOURS AS NEEDED FOR NAUSEA OR NERVES  60 tablet  2  . levothyroxine (SYNTHROID, LEVOTHROID) 125 MCG tablet Take 1 tablet (125 mcg total) by mouth daily.  90 tablet  3  . promethazine (PHENERGAN) 25 MG tablet Take 1 tablet (25 mg total) by mouth every 6 (six) hours as needed.  60 tablet  1  . QUEtiapine (SEROQUEL) 100 MG tablet Take 100 mg by mouth at bedtime.        Marland Kitchen tiotropium (SPIRIVA) 18 MCG inhalation capsule Place 18 mcg into inhaler and inhale daily.        . traZODone (DESYREL) 100 MG tablet TAKE ONE TABLET BY MOUTH AT BEDTIME AS NEEDED  90 tablet  1  . chlorpheniramine-hydrocodone (TUSSIONEX PENNKINETIC ER) 10-8 MG/5ML LQCR Take 5 mLs by mouth. 1 tsp. By mouth two times a day as needed for cough       . pregabalin (LYRICA) 50 MG capsule Take 1 capsule (50 mg total) by mouth 2 (two) times daily.  180 capsule  3  . triamcinolone (KENALOG) 0.1 % cream Apply topically 2 (two) times daily.  30 g  0   ,Review of Systems Review of Systems  Constitutional: Negative for diaphoresis, activity change, appetite change and unexpected weight change.  HENT: Negative for hearing loss, ear pain, facial swelling, mouth sores and neck stiffness.   Eyes: Negative for pain, redness and visual disturbance.  Respiratory: Negative for  shortness of breath and wheezing.   Cardiovascular: Negative for chest pain and palpitations.  Gastrointestinal: Negative for diarrhea, blood in stool, abdominal distention and rectal pain.  Genitourinary: Negative for hematuria, flank pain and decreased urine volume.  Musculoskeletal: Negative for myalgias and joint swelling.  Skin: Negative for color change and wound.  Neurological: Negative for syncope and numbness.  Hematological: Negative for adenopathy.    Objective:   Physical Exam BP 140/90  Pulse 93  Temp(Src) 98.5 F (36.9 C) (Oral)  Ht 5\' 7"  (1.702 m)  Wt 186 lb 8 oz (84.596 kg)  BMI 29.21 kg/m2  SpO2 97% Physical Exam  VS noted Constitutional: Pt appears well-developed and well-nourished.  HENT: Head: Normocephalic.  Right Ear: External ear normal.  Left Ear: External ear normal.  Eyes: Conjunctivae and EOM are normal. Pupils are equal, round, and reactive to light.  Neck: Normal range of motion. Neck supple.  Cardiovascular: Normal rate and regular rhythm.   Pulmonary/Chest: Effort normal and breath sounds normal.  Abd:  Soft, NT, non-distended, + BS Neurological: Pt is alert. No cranial nerve deficit.  Skin: Skin is warm. No erythema.  Psychiatric: Pt behavior is normal. Thought content normal. except 1-2+ nervous    Assessment & Plan:

## 2011-08-10 NOTE — Patient Instructions (Addendum)
Your flexeril was refilled today The lortab 5/500 mg includes 500 mg tylenol;  Remember if you take other tylenol not to take more than 3000 mg of the tylenol per day Please continue the cymbalta if you can and disregard the prescription for the citalopram done today to your pharmacy Continue all other medications as before, including the clonazepam Please go to LAB in the Basement for the blood and/or urine tests to be done at your convenience Please call the phone number [phone] (the [employer] System) for results of testing in 2-3 days;  When calling, simply dial the number, and when prompted enter the MRN number above (the Medical Record Number) and the # key, then the message should start. Please have the pharmacy call if you need other refills Please return in 6 months, or sooner if needed

## 2011-08-10 NOTE — Assessment & Plan Note (Addendum)
Unable to afford the cymbalta recently but not sure with new insurance, will consider the citalopram 10 qd,  to f/u any worsening symptoms or concerns, and given rx for seroquel and cymbalta today per pt reqeust

## 2011-08-11 ENCOUNTER — Encounter: Payer: Self-pay | Admitting: Internal Medicine

## 2011-08-11 NOTE — Assessment & Plan Note (Signed)
stable overall by hx and exam, most recent data reviewed with pt, and pt to continue medical treatment as before  SpO2 Readings from Last 3 Encounters:  08/10/11 97%  04/20/11 97%  02/16/11 98%

## 2011-08-11 NOTE — Assessment & Plan Note (Signed)
stable overall by hx and exam, most recent data reviewed with pt, and pt to continue medical treatment as before, for labs today Lab Results  Component Value Date   HGBA1C 6.5 07/20/2010

## 2011-08-17 ENCOUNTER — Telehealth: Payer: Self-pay

## 2011-08-17 ENCOUNTER — Encounter (HOSPITAL_COMMUNITY): Payer: Self-pay | Admitting: Emergency Medicine

## 2011-08-17 ENCOUNTER — Emergency Department (HOSPITAL_COMMUNITY): Payer: Medicare Other

## 2011-08-17 ENCOUNTER — Inpatient Hospital Stay (HOSPITAL_COMMUNITY)
Admission: EM | Admit: 2011-08-17 | Discharge: 2011-08-19 | DRG: 918 | Disposition: A | Discharge status: Home Health | Payer: Medicare Other | Attending: Internal Medicine | Admitting: Internal Medicine

## 2011-08-17 DIAGNOSIS — N259 Disorder resulting from impaired renal tubular function, unspecified: Secondary | ICD-10-CM

## 2011-08-17 DIAGNOSIS — R2681 Unsteadiness on feet: Secondary | ICD-10-CM

## 2011-08-17 DIAGNOSIS — G4700 Insomnia, unspecified: Secondary | ICD-10-CM

## 2011-08-17 DIAGNOSIS — N390 Urinary tract infection, site not specified: Secondary | ICD-10-CM

## 2011-08-17 DIAGNOSIS — T43591A Poisoning by other antipsychotics and neuroleptics, accidental (unintentional), initial encounter: Secondary | ICD-10-CM | POA: Diagnosis present

## 2011-08-17 DIAGNOSIS — R569 Unspecified convulsions: Secondary | ICD-10-CM

## 2011-08-17 DIAGNOSIS — J383 Other diseases of vocal cords: Secondary | ICD-10-CM

## 2011-08-17 DIAGNOSIS — F411 Generalized anxiety disorder: Secondary | ICD-10-CM

## 2011-08-17 DIAGNOSIS — G609 Hereditary and idiopathic neuropathy, unspecified: Secondary | ICD-10-CM

## 2011-08-17 DIAGNOSIS — M549 Dorsalgia, unspecified: Secondary | ICD-10-CM

## 2011-08-17 DIAGNOSIS — G43009 Migraine without aura, not intractable, without status migrainosus: Secondary | ICD-10-CM

## 2011-08-17 DIAGNOSIS — R5381 Other malaise: Secondary | ICD-10-CM

## 2011-08-17 DIAGNOSIS — G894 Chronic pain syndrome: Secondary | ICD-10-CM | POA: Diagnosis present

## 2011-08-17 DIAGNOSIS — M25569 Pain in unspecified knee: Secondary | ICD-10-CM | POA: Diagnosis present

## 2011-08-17 DIAGNOSIS — E785 Hyperlipidemia, unspecified: Secondary | ICD-10-CM

## 2011-08-17 DIAGNOSIS — G2581 Restless legs syndrome: Secondary | ICD-10-CM

## 2011-08-17 DIAGNOSIS — D649 Anemia, unspecified: Secondary | ICD-10-CM

## 2011-08-17 DIAGNOSIS — G4710 Hypersomnia, unspecified: Secondary | ICD-10-CM

## 2011-08-17 DIAGNOSIS — M25519 Pain in unspecified shoulder: Secondary | ICD-10-CM

## 2011-08-17 DIAGNOSIS — M8580 Other specified disorders of bone density and structure, unspecified site: Secondary | ICD-10-CM

## 2011-08-17 DIAGNOSIS — Z0000 Encounter for general adult medical examination without abnormal findings: Secondary | ICD-10-CM

## 2011-08-17 DIAGNOSIS — M5430 Sciatica, unspecified side: Secondary | ICD-10-CM

## 2011-08-17 DIAGNOSIS — J4489 Other specified chronic obstructive pulmonary disease: Secondary | ICD-10-CM

## 2011-08-17 DIAGNOSIS — F329 Major depressive disorder, single episode, unspecified: Secondary | ICD-10-CM | POA: Diagnosis present

## 2011-08-17 DIAGNOSIS — N179 Acute kidney failure, unspecified: Secondary | ICD-10-CM | POA: Diagnosis present

## 2011-08-17 DIAGNOSIS — J309 Allergic rhinitis, unspecified: Secondary | ICD-10-CM

## 2011-08-17 DIAGNOSIS — N959 Unspecified menopausal and perimenopausal disorder: Secondary | ICD-10-CM

## 2011-08-17 DIAGNOSIS — R7302 Impaired glucose tolerance (oral): Secondary | ICD-10-CM | POA: Diagnosis present

## 2011-08-17 DIAGNOSIS — K219 Gastro-esophageal reflux disease without esophagitis: Secondary | ICD-10-CM

## 2011-08-17 DIAGNOSIS — R1084 Generalized abdominal pain: Secondary | ICD-10-CM

## 2011-08-17 DIAGNOSIS — Z79899 Other long term (current) drug therapy: Secondary | ICD-10-CM

## 2011-08-17 DIAGNOSIS — E119 Type 2 diabetes mellitus without complications: Secondary | ICD-10-CM

## 2011-08-17 DIAGNOSIS — Z860100 Personal history of colon polyps, unspecified: Secondary | ICD-10-CM

## 2011-08-17 DIAGNOSIS — R5383 Other fatigue: Secondary | ICD-10-CM | POA: Diagnosis present

## 2011-08-17 DIAGNOSIS — J441 Chronic obstructive pulmonary disease with (acute) exacerbation: Secondary | ICD-10-CM | POA: Diagnosis present

## 2011-08-17 DIAGNOSIS — N183 Chronic kidney disease, stage 3 unspecified: Secondary | ICD-10-CM | POA: Diagnosis present

## 2011-08-17 DIAGNOSIS — R269 Unspecified abnormalities of gait and mobility: Secondary | ICD-10-CM

## 2011-08-17 DIAGNOSIS — M25561 Pain in right knee: Secondary | ICD-10-CM

## 2011-08-17 DIAGNOSIS — R296 Repeated falls: Secondary | ICD-10-CM

## 2011-08-17 DIAGNOSIS — I739 Peripheral vascular disease, unspecified: Secondary | ICD-10-CM

## 2011-08-17 DIAGNOSIS — F17200 Nicotine dependence, unspecified, uncomplicated: Secondary | ICD-10-CM

## 2011-08-17 DIAGNOSIS — K279 Peptic ulcer, site unspecified, unspecified as acute or chronic, without hemorrhage or perforation: Secondary | ICD-10-CM

## 2011-08-17 DIAGNOSIS — J449 Chronic obstructive pulmonary disease, unspecified: Secondary | ICD-10-CM

## 2011-08-17 DIAGNOSIS — R002 Palpitations: Secondary | ICD-10-CM

## 2011-08-17 DIAGNOSIS — F518 Other sleep disorders not due to a substance or known physiological condition: Secondary | ICD-10-CM

## 2011-08-17 DIAGNOSIS — IMO0002 Reserved for concepts with insufficient information to code with codable children: Secondary | ICD-10-CM

## 2011-08-17 DIAGNOSIS — M5412 Radiculopathy, cervical region: Secondary | ICD-10-CM

## 2011-08-17 DIAGNOSIS — N189 Chronic kidney disease, unspecified: Secondary | ICD-10-CM | POA: Diagnosis present

## 2011-08-17 DIAGNOSIS — E039 Hypothyroidism, unspecified: Secondary | ICD-10-CM

## 2011-08-17 DIAGNOSIS — G589 Mononeuropathy, unspecified: Secondary | ICD-10-CM | POA: Diagnosis present

## 2011-08-17 DIAGNOSIS — G8929 Other chronic pain: Secondary | ICD-10-CM

## 2011-08-17 DIAGNOSIS — Z8679 Personal history of other diseases of the circulatory system: Secondary | ICD-10-CM

## 2011-08-17 DIAGNOSIS — F3289 Other specified depressive episodes: Secondary | ICD-10-CM

## 2011-08-17 DIAGNOSIS — I129 Hypertensive chronic kidney disease with stage 1 through stage 4 chronic kidney disease, or unspecified chronic kidney disease: Secondary | ICD-10-CM | POA: Diagnosis present

## 2011-08-17 DIAGNOSIS — T43501A Poisoning by unspecified antipsychotics and neuroleptics, accidental (unintentional), initial encounter: Principal | ICD-10-CM | POA: Diagnosis present

## 2011-08-17 DIAGNOSIS — M1710 Unilateral primary osteoarthritis, unspecified knee: Secondary | ICD-10-CM

## 2011-08-17 DIAGNOSIS — M109 Gout, unspecified: Secondary | ICD-10-CM

## 2011-08-17 DIAGNOSIS — R413 Other amnesia: Secondary | ICD-10-CM

## 2011-08-17 DIAGNOSIS — I10 Essential (primary) hypertension: Secondary | ICD-10-CM | POA: Diagnosis present

## 2011-08-17 DIAGNOSIS — R531 Weakness: Secondary | ICD-10-CM | POA: Diagnosis present

## 2011-08-17 LAB — COMPREHENSIVE METABOLIC PANEL
AST: 16 U/L (ref 0–37)
BUN: 82 mg/dL — ABNORMAL HIGH (ref 6–23)
CO2: 21 mEq/L (ref 19–32)
Chloride: 102 mEq/L (ref 96–112)
Creatinine, Ser: 4.94 mg/dL — ABNORMAL HIGH (ref 0.50–1.10)
GFR calc Af Amer: 9 mL/min — ABNORMAL LOW (ref >90)
GFR calc non Af Amer: 8 mL/min — ABNORMAL LOW (ref >90)
Glucose, Bld: 118 mg/dL — ABNORMAL HIGH (ref 70–99)
Total Bilirubin: 0.1 mg/dL — ABNORMAL LOW (ref 0.3–1.2)

## 2011-08-17 LAB — CBC
HCT: 32.7 % — ABNORMAL LOW (ref 36.0–46.0)
HCT: 33.4 % — ABNORMAL LOW (ref 36.0–46.0)
Hemoglobin: 11.3 g/dL — ABNORMAL LOW (ref 12.0–15.0)
MCHC: 34.4 g/dL (ref 30.0–36.0)
MCV: 89.8 fL (ref 78.0–100.0)
Platelets: 202 10*3/uL (ref 150–400)
RDW: 16.1 % — ABNORMAL HIGH (ref 11.5–15.5)
WBC: 10.2 10*3/uL (ref 4.0–10.5)
WBC: 10.3 10*3/uL (ref 4.0–10.5)

## 2011-08-17 LAB — DIFFERENTIAL
Eosinophils Relative: 0 % (ref 0–5)
Lymphocytes Relative: 6 % — ABNORMAL LOW (ref 12–46)
Lymphs Abs: 0.7 10*3/uL (ref 0.7–4.0)
Monocytes Absolute: 0.5 10*3/uL (ref 0.1–1.0)
Monocytes Relative: 5 % (ref 3–12)
Neutro Abs: 9 10*3/uL — ABNORMAL HIGH (ref 1.7–7.7)

## 2011-08-17 LAB — URINE MICROSCOPIC-ADD ON

## 2011-08-17 LAB — CREATININE, SERUM
GFR calc Af Amer: 10 mL/min — ABNORMAL LOW (ref >90)
GFR calc non Af Amer: 8 mL/min — ABNORMAL LOW (ref >90)

## 2011-08-17 LAB — RAPID URINE DRUG SCREEN, HOSP PERFORMED
Opiates: NOT DETECTED
Tetrahydrocannabinol: NOT DETECTED

## 2011-08-17 LAB — URINALYSIS, ROUTINE W REFLEX MICROSCOPIC
Nitrite: NEGATIVE
Protein, ur: >300 mg/dL — AB
Urobilinogen, UA: 0.2 mg/dL (ref 0.0–1.0)

## 2011-08-17 LAB — GLUCOSE, CAPILLARY: Glucose-Capillary: 124 mg/dL — ABNORMAL HIGH (ref 70–99)

## 2011-08-17 MED ORDER — FERROUS SULFATE 325 (65 FE) MG PO TABS
325.0000 mg | ORAL_TABLET | Freq: Two times a day (BID) | ORAL | Status: DC
  Administered 2011-08-18 – 2011-08-19 (×3): 325 mg via ORAL
  Filled 2011-08-17 (×5): qty 1

## 2011-08-17 MED ORDER — ONDANSETRON HCL 4 MG PO TABS: 4.0000 mg | ORAL_TABLET | Freq: Four times a day (QID) | ORAL | Status: DC | PRN

## 2011-08-17 MED ORDER — ALBUTEROL SULFATE (5 MG/ML) 0.5% IN NEBU: 2.5000 mg | INHALATION_SOLUTION | RESPIRATORY_TRACT | Status: DC | PRN

## 2011-08-17 MED ORDER — ONDANSETRON HCL 4 MG/2ML IJ SOLN: 4.0000 mg | Freq: Four times a day (QID) | INTRAMUSCULAR | Status: DC | PRN

## 2011-08-17 MED ORDER — ALBUTEROL SULFATE (5 MG/ML) 0.5% IN NEBU
2.5000 mg | INHALATION_SOLUTION | Freq: Four times a day (QID) | RESPIRATORY_TRACT | Status: DC
  Administered 2011-08-17: 2.5 mg via RESPIRATORY_TRACT
  Filled 2011-08-17 (×2): qty 0.5

## 2011-08-17 MED ORDER — ACETAMINOPHEN 650 MG RE SUPP: 650.0000 mg | Freq: Four times a day (QID) | RECTAL | Status: DC | PRN

## 2011-08-17 MED ORDER — SODIUM CHLORIDE 0.9 % IV SOLN
INTRAVENOUS | Status: AC
  Administered 2011-08-17: 22:00:00 via INTRAVENOUS

## 2011-08-17 MED ORDER — QUETIAPINE FUMARATE 100 MG PO TABS
100.0000 mg | ORAL_TABLET | Freq: Every day | ORAL | Status: DC
  Administered 2011-08-17: 100 mg via ORAL
  Filled 2011-08-17 (×2): qty 1

## 2011-08-17 MED ORDER — IPRATROPIUM BROMIDE 0.02 % IN SOLN
0.5000 mg | Freq: Four times a day (QID) | RESPIRATORY_TRACT | Status: DC
  Administered 2011-08-17: 0.5 mg via RESPIRATORY_TRACT
  Filled 2011-08-17 (×2): qty 2.5

## 2011-08-17 MED ORDER — DOCUSATE SODIUM 100 MG PO CAPS
100.0000 mg | ORAL_CAPSULE | Freq: Two times a day (BID) | ORAL | Status: DC
  Administered 2011-08-17 – 2011-08-19 (×4): 100 mg via ORAL
  Filled 2011-08-17 (×6): qty 1

## 2011-08-17 MED ORDER — SIMVASTATIN 20 MG PO TABS
20.0000 mg | ORAL_TABLET | Freq: Every day | ORAL | Status: DC
  Administered 2011-08-17 – 2011-08-19 (×3): 20 mg via ORAL
  Filled 2011-08-17 (×4): qty 1

## 2011-08-17 MED ORDER — CLONAZEPAM 0.5 MG PO TABS
2.0000 mg | ORAL_TABLET | Freq: Two times a day (BID) | ORAL | Status: DC | PRN
  Administered 2011-08-19: 2 mg via ORAL
  Filled 2011-08-17: qty 4

## 2011-08-17 MED ORDER — SENNA 8.6 MG PO TABS
1.0000 | ORAL_TABLET | Freq: Two times a day (BID) | ORAL | Status: DC
  Administered 2011-08-17 – 2011-08-19 (×4): 8.6 mg via ORAL
  Filled 2011-08-17 (×6): qty 1

## 2011-08-17 MED ORDER — LEVOFLOXACIN 250 MG PO TABS
250.0000 mg | ORAL_TABLET | ORAL | Status: DC
  Filled 2011-08-17 (×3): qty 1

## 2011-08-17 MED ORDER — PANTOPRAZOLE SODIUM 40 MG PO TBEC
40.0000 mg | DELAYED_RELEASE_TABLET | Freq: Every day | ORAL | Status: DC
  Administered 2011-08-17 – 2011-08-19 (×3): 40 mg via ORAL
  Filled 2011-08-17 (×3): qty 1

## 2011-08-17 MED ORDER — ALBUTEROL SULFATE (5 MG/ML) 0.5% IN NEBU
5.0000 mg | INHALATION_SOLUTION | Freq: Once | RESPIRATORY_TRACT | Status: AC
  Administered 2011-08-17: 5 mg via RESPIRATORY_TRACT
  Filled 2011-08-17: qty 0.5

## 2011-08-17 MED ORDER — LEVOTHYROXINE SODIUM 125 MCG PO TABS
125.0000 ug | ORAL_TABLET | Freq: Every day | ORAL | Status: DC
  Administered 2011-08-17 – 2011-08-19 (×3): 125 ug via ORAL
  Filled 2011-08-17 (×4): qty 1

## 2011-08-17 MED ORDER — TRAZODONE HCL 100 MG PO TABS
100.0000 mg | ORAL_TABLET | Freq: Every evening | ORAL | Status: DC | PRN
  Administered 2011-08-19: 100 mg via ORAL
  Filled 2011-08-17: qty 1

## 2011-08-17 MED ORDER — ALBUTEROL SULFATE (5 MG/ML) 0.5% IN NEBU
2.5000 mg | INHALATION_SOLUTION | RESPIRATORY_TRACT | Status: AC | PRN
  Administered 2011-08-18: 2.5 mg via RESPIRATORY_TRACT
  Filled 2011-08-17: qty 0.5

## 2011-08-17 MED ORDER — GUAIFENESIN-DM 100-10 MG/5ML PO SYRP
5.0000 mL | ORAL_SOLUTION | ORAL | Status: DC | PRN
  Administered 2011-08-18 – 2011-08-19 (×2): 5 mL via ORAL
  Filled 2011-08-17 (×2): qty 5

## 2011-08-17 MED ORDER — HYDROCODONE-ACETAMINOPHEN 5-325 MG PO TABS
1.0000 | ORAL_TABLET | ORAL | Status: DC | PRN
  Administered 2011-08-18 (×2): 1 via ORAL
  Filled 2011-08-17 (×2): qty 1

## 2011-08-17 MED ORDER — IPRATROPIUM BROMIDE 0.02 % IN SOLN
0.5000 mg | Freq: Once | RESPIRATORY_TRACT | Status: AC
  Administered 2011-08-17: 0.5 mg via RESPIRATORY_TRACT
  Filled 2011-08-17: qty 2.5

## 2011-08-17 MED ORDER — ALUM & MAG HYDROXIDE-SIMETH 200-200-20 MG/5ML PO SUSP: 30.0000 mL | Freq: Four times a day (QID) | ORAL | Status: DC | PRN

## 2011-08-17 MED ORDER — GLIMEPIRIDE 1 MG PO TABS
1.0000 mg | ORAL_TABLET | Freq: Every day | ORAL | Status: DC
  Administered 2011-08-18 – 2011-08-19 (×2): 1 mg via ORAL
  Filled 2011-08-17 (×3): qty 1

## 2011-08-17 MED ORDER — INSULIN ASPART 100 UNIT/ML ~~LOC~~ SOLN
0.0000 [IU] | Freq: Three times a day (TID) | SUBCUTANEOUS | Status: DC
  Administered 2011-08-18: 3 [IU] via SUBCUTANEOUS
  Administered 2011-08-18: 2 [IU] via SUBCUTANEOUS
  Filled 2011-08-17: qty 3

## 2011-08-17 MED ORDER — HEPARIN SODIUM (PORCINE) 5000 UNIT/ML IJ SOLN
5000.0000 [IU] | Freq: Three times a day (TID) | INTRAMUSCULAR | Status: DC
  Administered 2011-08-17 – 2011-08-19 (×6): 5000 [IU] via SUBCUTANEOUS
  Filled 2011-08-17 (×8): qty 1

## 2011-08-17 MED ORDER — SODIUM CHLORIDE 0.9 % IV SOLN: INTRAVENOUS | Status: AC

## 2011-08-17 MED ORDER — ACETAMINOPHEN 325 MG PO TABS: 650.0000 mg | ORAL_TABLET | Freq: Four times a day (QID) | ORAL | Status: DC | PRN

## 2011-08-17 MED ORDER — ONDANSETRON HCL 4 MG/2ML IJ SOLN: 4.0000 mg | Freq: Three times a day (TID) | INTRAMUSCULAR | Status: AC | PRN

## 2011-08-17 NOTE — ED Provider Notes (Signed)
History     CSN: [id number]  Arrival date & time 08/17/11  1478   First MD Initiated Contact with Patient 08/17/11 [phone]      Chief Complaint  Patient presents with  . Weakness    (Consider location/radiation/quality/duration/timing/severity/associated sxs/prior treatment) Patient is a 71 y.o. female presenting with weakness. The history is provided by the patient and the EMS personnel.  Weakness Primary symptoms do not include syncope, loss of consciousness, focal weakness, loss of sensation, speech change, fever, nausea or vomiting. Primary symptoms comment: Balance problem Episode onset: 2 weeks ago. The symptoms are worsening. The neurological symptoms are diffuse. Context: Occurs when attempts to walk. Seems to be worse after taking her trazodone and Seroquel.  Additional symptoms include weakness and loss of balance. Additional symptoms do not include pain, lower back pain, leg pain, photophobia, taste disturbance or dysphoric mood. Medical issues also include diabetes and hypertension. Medical issues do not include cerebral vascular accident, drug use or recent surgery.    Past Medical History  Diagnosis Date  . HYPOTHYROIDISM 02/17/2007  . DIABETES MELLITUS, TYPE II 02/17/2007  . HYPERLIPIDEMIA 02/17/2007  . GOUT 05/29/2007  . ANEMIA-NOS 05/29/2007  . ANXIETY 03/23/2010  . 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 DEPRESSION 02/17/2007  . RESTLESS LEG SYNDROME 05/29/2007  . COMMON MIGRAINE 05/29/2007  . PERIPHERAL NEUROPATHY 05/29/2007  . HYPERTENSION 02/17/2007  . PERIPHERAL VASCULAR DISEASE 02/17/2007  . ASTHMATIC BRONCHITIS, ACUTE 10/08/2008  . COPD 05/29/2007  . GERD 02/17/2007  . PEPTIC ULCER DISEASE 05/29/2007  . GASTROENTERITIS 12/02/2007  . RENAL INSUFFICIENCY 02/17/2007  . MENOPAUSAL DISORDER 05/25/2009  . Osteoarth NOS-L/Leg 03/11/2008  . SHOULDER PAIN, LEFT, CHRONIC 09/15/2008  . CERVICAL RADICULOPATHY, LEFT 05/27/2008  . BACK PAIN 12/12/2009  . OSTEOPENIA 09/22/2009  . SEIZURE  DISORDER 02/17/2007  . HYPERSOMNIA 05/26/2010  . Memory loss 01/24/2010  . Abnormality of gait 07/30/2007  . ABDOMINAL PAIN -GENERALIZED 11/16/2009  . Personal history of colonic polyps 11/16/2009  . PERSISTENT DISORDER INITIATING/MAINTAINING SLEEP 08/22/2010  . INADEQUATE SLEEP HYGIENE 08/22/2010  . DYSPNEA 08/22/2010  . Wheezing 08/15/2010  . CHEST PAIN 08/15/2010  . CHRONIC OBSTRUCTIVE PULMONARY DISEASE, ACUTE EXACERBATION 09/08/2010  . Palpitations 09/08/2010  . Other diseases of vocal cords 09/19/2010  . HEART MURMUR, HX OF 10/02/2010  . 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 Chronic neck pain 12/13/2010  . Osteopenia 12/13/2010    Past Surgical History  Procedure Date  . Left toe amputated 2006  . Bunionectomy 1980  . Goiter removal 1997  . Stress cardiolite 06/18/2006  . Tranthoracic echocardiogram 06/18/2006  . Electrocardiogram 05/29/2006  . Cholecystectomy   . Rotator cuff repair Left    Dr. Lajoyce Corners    Family History  Problem Relation Age of Onset  . Dementia Mother   . Coronary artery disease Other   . Hyperlipidemia Other   . Hypertension Other   . Ovarian cancer Other   . Stroke Other     History  Substance Use Topics  . Smoking status: Current Some Day Smoker  . Smokeless tobacco: Not on file   Comment: Using Nicorette Lozenges  . Alcohol Use: No    OB History    Grav Para Term Preterm Abortions TAB SAB Ect Mult Living                  Review of Systems  Constitutional: Negative for fever, chills and appetite change.  HENT: Negative for congestion and rhinorrhea.   Eyes: Negative for photophobia.  Respiratory: Positive for  cough, shortness of breath and wheezing.   Cardiovascular: Negative for chest pain, leg swelling and syncope.  Gastrointestinal: Negative for nausea, vomiting, abdominal pain and diarrhea.  Genitourinary: Negative for dysuria and flank pain.  Skin: Negative for rash.  Neurological: Positive for weakness and loss of balance. Negative for speech  change, focal weakness and loss of consciousness.  Psychiatric/Behavioral: Negative for dysphoric mood.  All other systems reviewed and are negative.    Allergies  Nsaids and Pioglitazone  Home Medications   Current Outpatient Rx  Name Route Sig Dispense Refill  . ATORVASTATIN CALCIUM 40 MG PO TABS Oral Take 1 tablet (40 mg total) by mouth daily. 30 tablet 11  . CLONAZEPAM 2 MG PO TABS Oral Take 1 tablet (2 mg total) by mouth 2 (two) times daily as needed for anxiety. 60 tablet 5  . CYCLOBENZAPRINE HCL 5 MG PO TABS Oral Take 1 tablet (5 mg total) by mouth 3 (three) times daily as needed. 90 tablet 3  . DIPHENHYD-HYDROCORT-NYSTATIN MT SUSP  Swish and swallow 1 teaspoon four times daily 4 oz 0  . DULOXETINE HCL 60 MG PO CPEP Oral Take 1 capsule (60 mg total) by mouth daily. 90 capsule 3  . FERROUS SULFATE 325 (65 FE) MG PO TABS  1 tab by mouth twice per day 30 tablet 11  . FLUTICASONE PROPIONATE 50 MCG/ACT NA SUSP Nasal Place 2 sprays into the nose daily.      . FUROSEMIDE 80 MG PO TABS Oral Take 80 mg by mouth 3 (three) times daily.      Marland Kitchen GLIMEPIRIDE 1 MG PO TABS Oral Take 1 mg by mouth every morning before breakfast. 1 po every day     . HYDROCODONE-ACETAMINOPHEN 5-500 MG PO TABS Oral Take 1 tablet by mouth every 8 (eight) hours as needed for pain. 120 tablet 0  . HYDROXYZINE HCL 25 MG PO TABS  TAKE ONE TABLET BY MOUTH EVERY 6 HOURS AS NEEDED FOR NAUSEA OR NERVES 60 tablet 2  . LEVOCETIRIZINE DIHYDROCHLORIDE 5 MG PO TABS Oral Take 5 mg by mouth daily.      Marland Kitchen LEVOTHYROXINE SODIUM 125 MCG PO TABS Oral Take 1 tablet (125 mcg total) by mouth daily. 90 tablet 3  . MOMETASONE FUROATE 50 MCG/ACT NA SUSP Nasal Place 2 sprays into the nose daily.      Marland Kitchen OMEPRAZOLE 40 MG PO CPDR Oral Take 40 mg by mouth daily.      Marland Kitchen PROMETHAZINE HCL 25 MG PO TABS Oral Take 1 tablet (25 mg total) by mouth every 6 (six) hours as needed. 60 tablet 1  . QUETIAPINE FUMARATE 100 MG PO TABS Oral Take 1 tablet (100 mg  total) by mouth at bedtime. 90 tablet 3  . TIOTROPIUM BROMIDE MONOHYDRATE 18 MCG IN CAPS Inhalation Place 18 mcg into inhaler and inhale daily.      . TRAZODONE HCL 100 MG PO TABS  TAKE ONE TABLET BY MOUTH AT BEDTIME AS NEEDED 90 tablet 1    There were no vitals taken for this visit.  Physical Exam  Nursing note and vitals reviewed. Constitutional: She is oriented to person, place, and time. She appears well-developed and well-nourished. No distress.  HENT:  Head: Normocephalic and atraumatic.  Eyes: EOM are normal. Pupils are equal, round, and reactive to light.  Neck: Normal range of motion. Neck supple.  Cardiovascular: Normal rate, regular rhythm, normal heart sounds and intact distal pulses.  Exam reveals no friction rub.   No  murmur heard. Pulmonary/Chest: Effort normal. She has wheezes. She has no rales.  Abdominal: Soft. Bowel sounds are normal. She exhibits no distension. There is no tenderness. There is no rebound and no guarding.  Musculoskeletal: Normal range of motion. She exhibits no tenderness.       No edema  Lymphadenopathy:    She has no cervical adenopathy.  Neurological: She is alert and oriented to person, place, and time. She has normal strength. No cranial nerve deficit or sensory deficit. GCS eye subscore is 4. GCS verbal subscore is 5. GCS motor subscore is 6.  Skin: Skin is warm and dry. No rash noted.  Psychiatric: She has a normal mood and affect. Her behavior is normal.    ED Course  Procedures (including critical care time)  Labs Reviewed  CBC - Abnormal; Notable for the following:    RBC 3.64 (*)    Hemoglobin 11.3 (*)    HCT 32.7 (*)    RDW 16.1 (*)    All other components within normal limits  DIFFERENTIAL - Abnormal; Notable for the following:    Neutrophils Relative 88 (*)    Neutro Abs 9.0 (*)    Lymphocytes Relative 6 (*)    All other components within normal limits  COMPREHENSIVE METABOLIC PANEL - Abnormal; Notable for the following:     Glucose, Bld 118 (*)    BUN 82 (*)    Creatinine, Ser 4.94 (*)    Albumin 3.2 (*)    Total Bilirubin 0.1 (*)    GFR calc non Af Amer 8 (*)    GFR calc Af Amer 9 (*)    All other components within normal limits  URINALYSIS, ROUTINE W REFLEX MICROSCOPIC - Abnormal; Notable for the following:    APPearance CLOUDY (*)    Hgb urine dipstick SMALL (*)    Protein, ur >300 (*)    All other components within normal limits  URINE RAPID DRUG SCREEN (HOSP PERFORMED) - Abnormal; Notable for the following:    Benzodiazepines POSITIVE (*)    All other components within normal limits  URINE MICROSCOPIC-ADD ON - Abnormal; Notable for the following:    Bacteria, UA MANY (*)    All other components within normal limits   Dg Chest 2 View  08/17/2011  *RADIOLOGY REPORT*  Clinical Data: Wheezing.  Weakness.  CHEST - 2 VIEW  Comparison: Single view of the chest 09/05/2010 and PA and lateral chest 08/15/2010.  Findings: Again seen is mild elevation of the left hemidiaphragm. Right basilar atelectasis has improved.  Patchy airspace disease in the left lung base persists.  No pneumothorax identified.  There is no pleural effusion.  IMPRESSION: Patchy left basilar airspace disease could be due to atelectasis or pneumonia.  Right basilar atelectasis has resolved.  Original Report Authenticated By: Bernadene Bell. Maricela Curet, M.D.   Ct Head Wo Contrast  08/17/2011  *RADIOLOGY REPORT*  Clinical Data: Unsteady gait.  Status post multiple falls.  CT HEAD WITHOUT CONTRAST  Technique:  Contiguous axial images were obtained from the base of the skull through the vertex without contrast.  Comparison: Brain MRI 01/31/2010.  Findings: No evidence of acute intracranial abnormality including acute infarction, hemorrhage, mass lesion, mass effect, midline shift or abnormal extra-axial fluid collection is identified. There is no hydrocephalus or pneumocephalus.  The patient has some chronic microvascular ischemic change.  Infiltration of  subcutaneous fat over the left occipital bone is compatible with hematoma.  No underlying fracture is identified.  IMPRESSION: Scalp hematoma  in the left occipital bone.  No acute intracranial abnormality.  Original Report Authenticated By: Bernadene Bell. Maricela Curet, M.D.     Date: 08/17/2011  Rate: 82  Rhythm: normal sinus rhythm  QRS Axis: normal  Intervals: normal  ST/T Wave abnormalities: normal  Conduction Disutrbances:none  Narrative Interpretation:   Old EKG Reviewed: unchanged    1. Falls frequently   2. Polypharmacy       MDM   Patient coming from home after EMS was called due to fall and inability to get up. She states over the last 2 weeks she's had multiple falls due to balance problems. She states she just does not feel right. She denies any infectious symptoms but is wheezing on exam and states she is short of breath. Unclear whether she is taking her inhalers appropriately. Also patient states she is taking Seroquel and trazodone to help her sleep and felt that they were making her feel "funny" and stopped taking the extra medication yesterday. They spoke with her doctor at this morning Dr. Jonny Ruiz and he felt that she sounded more confused and wanted her to be evaluated. There are no focal signs of injury on the patient today and exam is relatively normal other than wheezing on respiratory exam and mild confusion. Feel symptoms are most likely from polypharmacy however could also be from UTI and less likely a stroke. Will walk the patient's after results have returned. Head CT, CBC, CMP, UA, chest x-ray, EKG pending. Albuterol and Atrovent given for wheezing.  3:36 PM All labs within normal limits head CT with external scalp contusion but no internal bleeding.  On repeat exam patient is still extremely somnolent and feel that this is polypharmacy causing her issues as she is on 6 different mind altering medications. Admit for further evaluation.      Gwyneth Sprout,  MD 08/17/11 1537

## 2011-08-17 NOTE — ED Notes (Signed)
Patient is resting comfortably.unable to get temp pt.asleep.

## 2011-08-17 NOTE — H&P (Addendum)
PATIENT DETAILS Name: [name] [name] Age: 71 y.o. Sex: female Date of Birth: 26-Jul-1941 Admit Date: 08/17/2011 ZOX:[id number] John, MD, MD   CHIEF COMPLAINT:  Generalized weakness and unsteady gait X 3 weeks  HPI: Patient is a 71 year old African American female, with a significant past medical history of diabetes, hypothyroidism, chronic pain syndrome (shoulder pain, chronic back pain, osteoarthritis in the knees,) anxiety with depression-on multiple medications was brought by EMS to the hospital for the above noted complaints. The patient she returned home from [place] in the last week of December, she went to [place] to take care of her elderly mother with dementia. Ever since coming back, patient has felt very weak, she claims that she is unsteady on her feet and has fallen numerous times. Currently she is awake and mostly alert, but very lethargic. Her speech is slow but mostly clear. History is somewhat limited by a lethargy. She denies any fever, denies any shortness of breath or chest pain to me. She denies any abdominal pain or nausea and vomiting. She claims there have been some changes to her medications she is not taking some medications because she cannot afford it. Given her lethargic, it is not very clear which medication she is exactly referring to, however it seems she is not taking any of the Cymbalta secondary to financial issues. Patient also denies any headache or neck pain. Because of the persistent falls and generalized weakness with unsteady gait, she called her primary care practitioner's office, who then called EMS-who brought the patient to the emergency room. This patient lives alone, her brothers live near by in [place]. She denies any dizziness or lightheadedness. She claims that she falls but does not pass out.  ALLERGIES:   Allergies  Allergen Reactions  . Nsaids     REACTION: renal dysfunction  . Pioglitazone     REACTION: EDEMA    PAST MEDICAL HISTORY: Past  Medical History  Diagnosis Date  . HYPOTHYROIDISM 02/17/2007  . DIABETES MELLITUS, TYPE II 02/17/2007  . HYPERLIPIDEMIA 02/17/2007  . GOUT 05/29/2007  . ANEMIA-NOS 05/29/2007  . ANXIETY 03/23/2010  . 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 DEPRESSION 02/17/2007  . RESTLESS LEG SYNDROME 05/29/2007  . COMMON MIGRAINE 05/29/2007  . PERIPHERAL NEUROPATHY 05/29/2007  . HYPERTENSION 02/17/2007  . PERIPHERAL VASCULAR DISEASE 02/17/2007  . ASTHMATIC BRONCHITIS, ACUTE 10/08/2008  . COPD 05/29/2007  . GERD 02/17/2007  . PEPTIC ULCER DISEASE 05/29/2007  . GASTROENTERITIS 12/02/2007  . RENAL INSUFFICIENCY 02/17/2007  . MENOPAUSAL DISORDER 05/25/2009  . Osteoarth NOS-L/Leg 03/11/2008  . SHOULDER PAIN, LEFT, CHRONIC 09/15/2008  . CERVICAL RADICULOPATHY, LEFT 05/27/2008  . BACK PAIN 12/12/2009  . OSTEOPENIA 09/22/2009  . SEIZURE DISORDER 02/17/2007  . HYPERSOMNIA 05/26/2010  . Memory loss 01/24/2010  . Abnormality of gait 07/30/2007  . ABDOMINAL PAIN -GENERALIZED 11/16/2009  . Personal history of colonic polyps 11/16/2009  . PERSISTENT DISORDER INITIATING/MAINTAINING SLEEP 08/22/2010  . INADEQUATE SLEEP HYGIENE 08/22/2010  . DYSPNEA 08/22/2010  . Wheezing 08/15/2010  . CHEST PAIN 08/15/2010  . CHRONIC OBSTRUCTIVE PULMONARY DISEASE, ACUTE EXACERBATION 09/08/2010  . Palpitations 09/08/2010  . Other diseases of vocal cords 09/19/2010  . HEART MURMUR, HX OF 10/02/2010  . 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 Chronic neck pain 12/13/2010  . Osteopenia 12/13/2010    PAST SURGICAL HISTORY: Past Surgical History  Procedure Date  . Left toe amputated 2006  . Bunionectomy 1980  . Goiter removal 1997  . Stress cardiolite 06/18/2006  . Tranthoracic echocardiogram 06/18/2006  . Electrocardiogram 05/29/2006  .  Cholecystectomy   . Rotator cuff repair Left    Dr. Lajoyce Corners    MEDICATIONS AT HOME: Prior to Admission medications   Medication Sig Start Date End Date Taking? Authorizing Provider  atorvastatin (LIPITOR) 40 MG tablet Take 1 tablet  (40 mg total) by mouth daily. 05/30/11  Yes Oliver Barre, MD  clonazePAM (KLONOPIN) 2 MG tablet Take 1 tablet (2 mg total) by mouth 2 (two) times daily as needed for anxiety. 08/10/11  Yes Oliver Barre, MD  cyclobenzaprine (FLEXERIL) 5 MG tablet Take 5 mg by mouth 3 (three) times daily as needed. For muscle spasms 08/10/11  Yes Oliver Barre, MD  ferrous sulfate 325 (65 FE) MG tablet 1 tab by mouth twice per day 04/20/11  Yes Oliver Barre, MD  fluticasone [facility]) 50 MCG/ACT nasal spray Place 2 sprays into the nose daily.     Yes Historical Provider, MD  furosemide (LASIX) 80 MG tablet Take 80 mg by mouth 3 (three) times daily.     Yes Historical Provider, MD  glimepiride (AMARYL) 1 MG tablet Take 1 mg by mouth every morning before breakfast. 1 po every day    Yes Historical Provider, MD  HYDROcodone-acetaminophen (VICODIN) 5-500 MG per tablet Take 1 tablet by mouth every 8 (eight) hours as needed for pain. 08/10/11 08/20/11 Yes Oliver Barre, MD  hydrOXYzine (ATARAX/VISTARIL) 25 MG tablet Take 25 mg by mouth every 6 (six) hours as needed. For nausea or nerves   Yes Historical Provider, MD  levocetirizine (XYZAL) 5 MG tablet Take 5 mg by mouth daily.     Yes Historical Provider, MD  levothyroxine (SYNTHROID, LEVOTHROID) 125 MCG tablet Take 1 tablet (125 mcg total) by mouth daily. 12/13/10  Yes Oliver Barre, MD  omeprazole (PRILOSEC) 40 MG capsule Take 40 mg by mouth daily.     Yes Historical Provider, MD  promethazine (PHENERGAN) 25 MG tablet Take 25 mg by mouth every 6 (six) hours as needed. For nausea 02/16/11  Yes Oliver Barre, MD  QUEtiapine (SEROQUEL) 100 MG tablet Take 1 tablet (100 mg total) by mouth at bedtime. 08/10/11  Yes Oliver Barre, MD  tiotropium (SPIRIVA) 18 MCG inhalation capsule Place 18 mcg into inhaler and inhale daily.     Yes Historical Provider, MD  traZODone (DESYREL) 100 MG tablet Take 100 mg by mouth at bedtime as needed. For sleep   Yes Historical Provider, MD  traZODone (DESYREL) 100 MG tablet  02/13/11  08/17/11 Yes Oliver Barre, MD  Diphenhyd-Hydrocort-Nystatin SUSP Swish and swallow 1 teaspoon four times daily 05/29/11 05/28/12  Michele Mcalpine, MD    FAMILY HISTORY: Family History  Problem Relation Age of Onset  . Dementia Mother   . Coronary artery disease Other   . Hyperlipidemia Other   . Hypertension Other   . Ovarian cancer Other   . Stroke Other     SOCIAL HISTORY:  reports that she has been smoking.  She does not have any smokeless tobacco history on file. She reports that she does not drink alcohol or use illicit drugs.  REVIEW OF SYSTEMS:  Constitutional:   No  weight loss, night sweats,  Fevers, chills, fatigue.  HEENT:    No headaches, Difficulty swallowing,Tooth/dental problems,Sore throat,  No sneezing, itching, ear ache, nasal congestion, post nasal drip,   Cardio-vascular: No chest pain,  Orthopnea, PND, swelling in lower extremities, anasarca, dizziness, palpitations  GI:  No heartburn, indigestion, abdominal pain, nausea, vomiting, diarrhea, change in      bowel habits, loss  of appetite  Resp: No shortness of breath with exertion or at rest.  No excess mucus, no productive cough, No non-productive cough,  No coughing up of blood.No change in color of mucus.No wheezing.No chest wall deformity  Skin:  no rash or lesions.  GU:  no dysuria, change in color of urine, no urgency or frequency.  No flank pain.  Musculoskeletal: No joint pain or swelling.  No decreased range of motion.  No back pain.  Psych: No change in mood or affect. No depression or anxiety.  No memory loss.   PHYSICAL EXAM: Blood pressure 147/79, pulse 78, temperature 98.6 F (37 C), temperature source Oral, resp. rate 15, SpO2 94.00%.  General appearance :Awake and mostly alert but very lethargic, not in any distress. Speech Clear but slow . Not toxic Looking HEENT: Atraumatic and Normocephalic, pupils equally reactive to light and accomodation Neck: supple, no JVD. No cervical  lymphadenopathy.  Very dry oral mucous membranes Chest: wheezing heard in all lung zones. Decreased air entry secondary to body habitus. CVS: S1 S2 regular, no murmurs.  Abdomen: Bowel sounds present, Non tender and not distended with no gaurding, rigidity or rebound. Extremities: B/L Lower Ext shows no edema, both legs are warm to touch, with  dorsalis pedis pulses palpable. Neurology: has generalized weakness but is Non focal, Deep Tendon Reflex-2+ all over, plantar's downgoing B/L, sensory exam is grossly intact.  Skin:No Rash Wounds:N/A  LABS ON ADMISSION:   Basename 08/17/11 0906  NA 136  K 4.6  CL 102  CO2 21  GLUCOSE 118*  BUN 82*  CREATININE 4.94*  CALCIUM 8.5  MG --  PHOS --    Basename 08/17/11 0906  AST 16  ALT 20  ALKPHOS 81  BILITOT 0.1*  PROT 6.8  ALBUMIN 3.2*   No results found for this basename: LIPASE:2,AMYLASE:2 in the last 72 hours  Basename 08/17/11 0906  WBC 10.2  NEUTROABS 9.0*  HGB 11.3*  HCT 32.7*  MCV 89.8  PLT 208   No results found for this basename: CKTOTAL:3,CKMB:3,CKMBINDEX:3,TROPONINI:3 in the last 72 hours No results found for this basename: DDIMER:2 in the last 72 hours No components found with this basename: POCBNP:3   RADIOLOGIC STUDIES ON ADMISSION: Dg Chest 2 View  08/17/2011  *RADIOLOGY REPORT*  Clinical Data: Wheezing.  Weakness.  CHEST - 2 VIEW  Comparison: Single view of the chest 09/05/2010 and PA and lateral chest 08/15/2010.  Findings: Again seen is mild elevation of the left hemidiaphragm. Right basilar atelectasis has improved.  Patchy airspace disease in the left lung base persists.  No pneumothorax identified.  There is no pleural effusion.  IMPRESSION: Patchy left basilar airspace disease could be due to atelectasis or pneumonia.  Right basilar atelectasis has resolved.  Original Report Authenticated By: Bernadene Bell. Maricela Curet, M.D.   Ct Head Wo Contrast  08/17/2011  *RADIOLOGY REPORT*  Clinical Data: Unsteady gait.   Status post multiple falls.  CT HEAD WITHOUT CONTRAST  Technique:  Contiguous axial images were obtained from the base of the skull through the vertex without contrast.  Comparison: Brain MRI 01/31/2010.  Findings: No evidence of acute intracranial abnormality including acute infarction, hemorrhage, mass lesion, mass effect, midline shift or abnormal extra-axial fluid collection is identified. There is no hydrocephalus or pneumocephalus.  The patient has some chronic microvascular ischemic change.  Infiltration of subcutaneous fat over the left occipital bone is compatible with hematoma.  No underlying fracture is identified.  IMPRESSION: Scalp hematoma in the left  occipital bone.  No acute intracranial abnormality.  Original Report Authenticated By: Bernadene Bell. D'ALESSIO, M.D.    ASSESSMENT AND PLAN: Present on Admission:  .Generalized weakness -Not sure what the exact etiology of this year, given her numerous medications perhaps polypharmacy is the culprit. However this has been going on for 3weeks, and patient claims that these are the same medications as she has been taking for years, at this point we will make sure that there is no occult infection brewing. She does have a equivocal urine analysis, her chest x-ray is suggestive of pneumonia/atelectasis-at this point I will send off a urine culture and empirically start her on Levaquin as this will cover both of the above noted areas.  -Her neck is very supple and she does not have any headache or photophobia, so I doubt a meningoencephalitis like processes going on.  -She does have a history of smoking, diabetes and hypertension, and because of the duration of the symptoms-I. will get a MRI of her brain to make sure that we are not dealing with CVA involving her posterior fossa causing gait instability.   .Gait instability -Not sure if this is just from weakness-as noted above. MRI of the brain will be ordered. She does not have any lateralizing signs  on neurological exam.CT of the head is negative, however a MRI of the brain has been ordered.  -PT/OT eval.   .UTI (lower urinary tract infection) -Urine culture will be ordered, patient will be empirically started on levofloxacin given overall clinical picture.   .Renal failure (ARF), acute on chronic -Patient appears to have stage III-IV chronic kidney disease at the slab, has proteinuria in a urinalysis so her chronic kidney disease is presumed to be from diabetic nephropathy. Her current creatinine is significantly higher than the usual baseline, she does appear to be dry clinically. She is also on high doses of Lasix. At this point we will stop her diuretics, and gently hydrate her overnight. She does see Dr. Lowell Guitar from [facility] as an outpatient, if her kidney function were not improve then perhaps we will need to consult nephrology   .ANEMIA-NOS -This is likely chronic and secondary to chronic kidney disease.   Marland KitchenANXIETY -Patient claims that she takes Klonopin on a regular basis, I will resume this-I feel that if I stop this she might have withdrawal seizures.   Marland KitchenCOPD exacerbation -mild  -Patient does have portable wheezing on exam, however she has not very short of breath and seems pretty comfortable. I will put on scheduled albuterol and Atrovent nebulizers. When she is better her Spiriva can be resumed.   Marland KitchenDEPRESSION -Patient apparently has a long-standing history of depression, reviewing her primary care practitioner's note, it looks like there has been a lot of changes in the patient's medications. Currently patient is not able to elaborate further. I think a psychiatric consultation is needed to optimize and minimize some of them medications. Her presenting symptoms could very well be from polypharmacy as she is also on narcotics, benzodiazepines, antihistamines and SSRIs.   Marland KitchenDIABETES MELLITUS, TYPE II -Place on sliding scale regimen. We'll resume Amaryl.   Marland KitchenPERIPHERAL  NEUROPATHY -Apparently was taking Lyrica which she claims she cannot afford anymore. I will hold off on resuming any of these medications for the time being until her clinical situation stabilizes.   Marland KitchenHYPERTENSION -Apparently she is not on any medications for hypertension, we will monitor her for now.   Marland KitchenHYPOTHYROIDISM -I. will order a TSH given her lethargic and a  weakness, however she does claim to me that she has been taking her levothyroxine. Her levothyroxine will be resumed.   Marland KitchenBACK PAIN -She is on chronic narcotics for back pains, shoulder pain and osteoarthritis of the knee. She claims that she has been taking them on a persistent basis recently, I will order them as needed.   Marland KitchenHYPERLIPIDEMIA -Resume statins.   Further plan will depend as patient's clinical course evolves and further radiologic and laboratory data become available. Patient will be monitored closely.  DVT Prophylaxis: Subcutaneous heparin  Code Status:  Full code Total time spent for admission equals 45 minutes.  Jeoffrey Massed 08/17/2011, 12:59 PM

## 2011-08-17 NOTE — ED Notes (Signed)
Pt sitting up in bed watching TV.  NAD.  No verbal complaints at this time.  TV remote provided along with warm blanket.

## 2011-08-17 NOTE — Progress Notes (Signed)
ANTIBIOTIC CONSULT NOTE - INITIAL  Pharmacy Consult for Levaquin Indication: Suspected UTI  Allergies  Allergen Reactions  . Nsaids     REACTION: renal dysfunction  . Pioglitazone     REACTION: EDEMA    Patient Measurements: Weight: 86.4 kg (from 08/10/11) Height: 170 cm (from 08/10/11)  Vital Signs: Temp: 98.6 F (37 C) (01/11 0911) Temp src: Oral (01/11 0911) BP: 184/85 mmHg (01/11 1452) Pulse Rate: 78  (01/11 1452) Intake/Output from previous day:   Intake/Output from this shift:    Labs:  [facility] 08/17/11 0906  WBC 10.2  HGB 11.3*  PLT 208  LABCREA --  CREATININE 4.94*   The CrCl is unknown because both a height and weight (above a minimum accepted value) are required for this calculation. No results found for this basename: VANCOTROUGH:2,VANCOPEAK:2,VANCORANDOM:2,GENTTROUGH:2,GENTPEAK:2,GENTRANDOM:2,TOBRATROUGH:2,TOBRAPEAK:2,TOBRARND:2,AMIKACINPEAK:2,AMIKACINTROU:2,AMIKACIN:2, in the last 72 hours   Microbiology: No results found for this or any previous visit (from the past 720 hour(s)).  Medical History: Past Medical History  Diagnosis Date  . HYPOTHYROIDISM 02/17/2007  . DIABETES MELLITUS, TYPE II 02/17/2007  . HYPERLIPIDEMIA 02/17/2007  . GOUT 05/29/2007  . ANEMIA-NOS 05/29/2007  . ANXIETY 03/23/2010  . 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 DEPRESSION 02/17/2007  . RESTLESS LEG SYNDROME 05/29/2007  . COMMON MIGRAINE 05/29/2007  . PERIPHERAL NEUROPATHY 05/29/2007  . HYPERTENSION 02/17/2007  . PERIPHERAL VASCULAR DISEASE 02/17/2007  . ASTHMATIC BRONCHITIS, ACUTE 10/08/2008  . COPD 05/29/2007  . GERD 02/17/2007  . PEPTIC ULCER DISEASE 05/29/2007  . GASTROENTERITIS 12/02/2007  . RENAL INSUFFICIENCY 02/17/2007  . MENOPAUSAL DISORDER 05/25/2009  . Osteoarth NOS-L/Leg 03/11/2008  . SHOULDER PAIN, LEFT, CHRONIC 09/15/2008  . CERVICAL RADICULOPATHY, LEFT 05/27/2008  . BACK PAIN 12/12/2009  . OSTEOPENIA 09/22/2009  . SEIZURE DISORDER 02/17/2007  . HYPERSOMNIA 05/26/2010  .  Memory loss 01/24/2010  . Abnormality of gait 07/30/2007  . ABDOMINAL PAIN -GENERALIZED 11/16/2009  . Personal history of colonic polyps 11/16/2009  . PERSISTENT DISORDER INITIATING/MAINTAINING SLEEP 08/22/2010  . INADEQUATE SLEEP HYGIENE 08/22/2010  . DYSPNEA 08/22/2010  . Wheezing 08/15/2010  . CHEST PAIN 08/15/2010  . CHRONIC OBSTRUCTIVE PULMONARY DISEASE, ACUTE EXACERBATION 09/08/2010  . Palpitations 09/08/2010  . Other diseases of vocal cords 09/19/2010  . HEART MURMUR, HX OF 10/02/2010  . 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 Chronic neck pain 12/13/2010  . Osteopenia 12/13/2010    Medications:  Anti-infectives    None     Assessment: 71 year old female admitted with frequent falls and lethargy to be treated empirically with Levaquin for suspected UTI. Urine culture to be collected. Urinalysis was cloudy, but negative for leukocytes/nitrite. Patient has a history of renal insufficiency (SCr on 08/17/11 was 4.94, estimated CrCl~12 ml/min)- CMET to be collected for today.   Goal of Therapy:  Clinical improvement  Plan:  1. Levaquin  250mg  po daily. 2. Will follow renal function and cultures. 3. Will follow MD for plan of duration (Recommended 3 days for uncomplicated UTI and 10 days for complicated UTI).  4. Recommend monitoring QTc interval as patient on Seroquel and Levaquin- both can prolong QT.   Fayne Norrie 08/17/2011,3:14 PM

## 2011-08-17 NOTE — Telephone Encounter (Signed)
Patient called to inform had to call 911 this am. Spoke to 911 and they stated scat had come to the home to pickup the patient up and she was had fallen, SCATcalled 911. 911 stated the patient has some confusion and off balance. MD informed would need to take the patient to the ER to further evaluate.

## 2011-08-17 NOTE — ED Notes (Signed)
Pt. Has yellow arm band high risk fall

## 2011-08-17 NOTE — ED Notes (Signed)
Per ems- Pt coming from home where she was reported acting more lethargic than normal.   Pt states that she took medications this morning including, muscle relaxer, hydrocodone, and lorazepam, along with her normal medications.  Pt unsure of amount taken.   Ems report that pt is non compliant at home.

## 2011-08-17 NOTE — ED Notes (Signed)
[id number] Ready 

## 2011-08-18 ENCOUNTER — Observation Stay (HOSPITAL_COMMUNITY): Payer: Medicare Other

## 2011-08-18 LAB — TSH: TSH: 1.009 u[IU]/mL (ref 0.350–4.500)

## 2011-08-18 LAB — COMPREHENSIVE METABOLIC PANEL
Albumin: 2.9 g/dL — ABNORMAL LOW (ref 3.5–5.2)
BUN: 78 mg/dL — ABNORMAL HIGH (ref 6–23)
Calcium: 8.3 mg/dL — ABNORMAL LOW (ref 8.4–10.5)
GFR calc Af Amer: 10 mL/min — ABNORMAL LOW (ref >90)
Glucose, Bld: 129 mg/dL — ABNORMAL HIGH (ref 70–99)
Sodium: 137 mEq/L (ref 135–145)
Total Protein: 6.2 g/dL (ref 6.0–8.3)

## 2011-08-18 LAB — URINE CULTURE
Colony Count: 3000
Culture  Setup Time: 201301122038

## 2011-08-18 LAB — CBC
HCT: 31.8 % — ABNORMAL LOW (ref 36.0–46.0)
Hemoglobin: 11 g/dL — ABNORMAL LOW (ref 12.0–15.0)
MCH: 31.1 pg (ref 26.0–34.0)
MCHC: 34.6 g/dL (ref 30.0–36.0)
RDW: 16.2 % — ABNORMAL HIGH (ref 11.5–15.5)

## 2011-08-18 LAB — GLUCOSE, CAPILLARY
Glucose-Capillary: 93 mg/dL (ref 70–99)
Glucose-Capillary: 74 mg/dL (ref 70–99)

## 2011-08-18 LAB — HEMOGLOBIN A1C: Hgb A1c MFr Bld: 6.6 % — ABNORMAL HIGH (ref <5.7)

## 2011-08-18 MED ORDER — FUROSEMIDE 80 MG PO TABS
80.0000 mg | ORAL_TABLET | Freq: Two times a day (BID) | ORAL | Status: DC
  Administered 2011-08-18 – 2011-08-19 (×3): 80 mg via ORAL
  Filled 2011-08-18 (×6): qty 1

## 2011-08-18 MED ORDER — ALBUTEROL SULFATE HFA 108 (90 BASE) MCG/ACT IN AERS
1.0000 | INHALATION_SPRAY | RESPIRATORY_TRACT | Status: DC | PRN
  Filled 2011-08-18: qty 6.7

## 2011-08-18 NOTE — Progress Notes (Signed)
Physical Therapy Evaluation  Pt s/p fall and lives alone.  States she cannot reach her niece due to phone disconnected (niece's) to see if she will stay with her on d/c.  Has necessary DME and will need HHPT on d/c.   Patient Details Name: [name] [name] MRN: [id number] DOB: 25-Feb-1941 Today's Date: 08/18/2011  Problem List:  Patient Active Problem List  Diagnoses  . HYPOTHYROIDISM  . DIABETES MELLITUS, TYPE II  . HYPERLIPIDEMIA  . GOUT  . ANEMIA-NOS  . ANXIETY  . CIGARETTE SMOKER  . DEPRESSION  . RESTLESS LEG SYNDROME  . COMMON MIGRAINE  . PERIPHERAL NEUROPATHY  . HYPERTENSION  . PERIPHERAL VASCULAR DISEASE  . ALLERGIC RHINITIS  . COPD  . GERD  . PEPTIC ULCER DISEASE  . RENAL INSUFFICIENCY  . MENOPAUSAL DISORDER  . Osteoarth NOS-L/Leg  . SHOULDER PAIN, LEFT, CHRONIC  . CERVICAL RADICULOPATHY, LEFT  . BACK PAIN  . SEIZURE DISORDER  . HYPERSOMNIA  . FATIGUE  . Memory loss  . Abnormality of gait  . ABDOMINAL PAIN -GENERALIZED  . Personal history of colonic polyps  . PERSISTENT DISORDER INITIATING/MAINTAINING SLEEP  . INADEQUATE SLEEP HYGIENE  . Palpitations  . Other diseases of vocal cords  . HEART MURMUR, HX OF  . Preventative health care  . Chronic sciatica  . Chronic neck pain  . Osteopenia  . Bilateral knee pain  . Generalized weakness  . Gait instability  . UTI (lower urinary tract infection)  . Renal failure (ARF), acute on chronic    Past Medical History:  Past Medical History  Diagnosis Date  . HYPOTHYROIDISM 02/17/2007  . DIABETES MELLITUS, TYPE II 02/17/2007  . HYPERLIPIDEMIA 02/17/2007  . GOUT 05/29/2007  . ANEMIA-NOS 05/29/2007  . ANXIETY 03/23/2010  . 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 DEPRESSION 02/17/2007  . RESTLESS LEG SYNDROME 05/29/2007  . COMMON MIGRAINE 05/29/2007  . PERIPHERAL NEUROPATHY 05/29/2007  . HYPERTENSION 02/17/2007  . PERIPHERAL VASCULAR DISEASE 02/17/2007  . ASTHMATIC BRONCHITIS, ACUTE 10/08/2008  . COPD 05/29/2007  . GERD  02/17/2007  . PEPTIC ULCER DISEASE 05/29/2007  . GASTROENTERITIS 12/02/2007  . RENAL INSUFFICIENCY 02/17/2007  . MENOPAUSAL DISORDER 05/25/2009  . Osteoarth NOS-L/Leg 03/11/2008  . SHOULDER PAIN, LEFT, CHRONIC 09/15/2008  . CERVICAL RADICULOPATHY, LEFT 05/27/2008  . BACK PAIN 12/12/2009  . OSTEOPENIA 09/22/2009  . SEIZURE DISORDER 02/17/2007  . HYPERSOMNIA 05/26/2010  . Memory loss 01/24/2010  . Abnormality of gait 07/30/2007  . ABDOMINAL PAIN -GENERALIZED 11/16/2009  . Personal history of colonic polyps 11/16/2009  . PERSISTENT DISORDER INITIATING/MAINTAINING SLEEP 08/22/2010  . INADEQUATE SLEEP HYGIENE 08/22/2010  . DYSPNEA 08/22/2010  . Wheezing 08/15/2010  . CHEST PAIN 08/15/2010  . CHRONIC OBSTRUCTIVE PULMONARY DISEASE, ACUTE EXACERBATION 09/08/2010  . Palpitations 09/08/2010  . Other diseases of vocal cords 09/19/2010  . HEART MURMUR, HX OF 10/02/2010  . 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 Chronic neck pain 12/13/2010  . Osteopenia 12/13/2010   Past Surgical History:  Past Surgical History  Procedure Date  . Left toe amputated 2006  . Bunionectomy 1980  . Goiter removal 1997  . Stress cardiolite 06/18/2006  . Tranthoracic echocardiogram 06/18/2006  . Electrocardiogram 05/29/2006  . Cholecystectomy   . Rotator cuff repair Left    Dr. Lajoyce Corners    PT Assessment/Plan/Recommendation PT Assessment Clinical Impression Statement: Pt s/p fall at home with h/o imbalance.  Pt unsure of cause of recent fall "knees gave out"  Pt instructed in use of and importance to use RW and pt agreeable  to use RW and to HHPTassessment.  Will benefit from PT to incr safety with mobility. PT Recommendation/Assessment: Patient will need skilled PT in the acute care venue PT Problem List: Decreased strength;Decreased balance;Decreased mobility;Decreased cognition;Decreased knowledge of use of DME;Decreased safety awareness;Impaired sensation Barriers to Discharge: Decreased caregiver support PT Therapy Diagnosis : Difficulty  walking PT Plan PT Frequency: Min 3X/week PT Treatment/Interventions: DME instruction;Gait training;Stair training;Functional mobility training;Therapeutic activities;Therapeutic exercise;Balance training;Patient/family education PT Recommendation Follow Up Recommendations: Home health PT Equipment Recommended: None recommended by PT PT Goals  Acute Rehab PT Goals PT Goal Formulation: With patient Time For Goal Achievement: 7 days Pt will go Supine/Side to Sit: Independently PT Goal: Supine/Side to Sit - Progress: Not met Pt will go Sit to Supine/Side: Independently PT Goal: Sit to Supine/Side - Progress: Not met Pt will go Sit to Stand: with modified independence;with upper extremity assist PT Goal: Sit to Stand - Progress: Not met Pt will go Stand to Sit: with modified independence;with upper extremity assist PT Goal: Stand to Sit - Progress: Not met Pt will Stand: with modified independence;with unilateral upper extremity support;1 - 2 min;Other (comment) (and able to reach > 6 inches) PT Goal: Stand - Progress: Not met Pt will Ambulate: >150 feet;with modified independence;with least restrictive assistive device PT Goal: Ambulate - Progress: Not met Pt will Go Up / Down Stairs: with supervision;with least restrictive assistive device PT Goal: Up/Down Stairs - Progress: Not met  PT Evaluation Precautions/Restrictions  Precautions Precautions: Fall Precaution Comments: States her "knees gave out" when she fell on day of admission; reports h/o imbalance and near falls Prior Functioning  Home Living Lives With: Alone Type of Home: House Home Layout: One level Home Access: Stairs to enter [employer] of Steps: 3 Home Adaptive Equipment: Straight cane;Walker - rolling Additional Comments: reports uses cane at times (does not like to use RW) Prior Function Level of Independence: Independent with homemaking with ambulation;Independent with basic ADLs Driving:  Yes Vocation: Retired Producer, television/film/video: Awake/alert Overall Cognitive Status: Impaired Orientation Level: Oriented X4 Safety/Judgement: Decreased awareness of safety precautions;Decreased safety judgement for tasks assessed (stated she was safe to get up alone p LOB when c PT) Decreased Safety/Judgement: Decreased awareness of need for assistance Awareness of Deficits: Decreased awareness of deficits Sensation/Coordination Sensation Light Touch: Impaired by gross assessment (pt denies numbness yet decr bal with eyes closed h/o toe amp) Extremity Assessment RLE Assessment RLE Assessment: Exceptions to [employer] RLE AROM (degrees) RLE Overall AROM Comments: Pt with h/o arthritis, knee extension strength 3+ to 4/5 LLE Assessment LLE Assessment: Exceptions to WFL LLE AROM (degrees) LLE Overall AROM Comments: Pt with h/o arthritis, knee extension strength 3+ to 4/5 Mobility (including Balance) Bed Mobility Bed Mobility: Yes Left Sidelying to Sit: 6: Modified independent (Device/Increase time);With rails;HOB elevated (comment degrees) Sitting - Scoot to Edge of Bed: 7: Independent Transfers Transfers: Yes Sit to Stand: 5: Supervision;With upper extremity assist;From bed Sit to Stand Details (indicate cue type and reason): for safety Stand to Sit: 5: Supervision;With upper extremity assist;To chair/3-in-1 Stand to Sit Details: slight uncontrolled descent Ambulation/Gait Ambulation/Gait: Yes Ambulation/Gait Assistance: 4: Min assist Ambulation/Gait Assistance Details (indicate cue type and reason): veers off path with head turns and when velocity decreased; wide BOS Ambulation Distance (Feet): 100 Feet Assistive device: None Gait Pattern: Decreased stride length;Right foot flat;Left foot flat  Posture/Postural Control Posture/Postural Control: No significant limitations Balance Balance Assessed: Yes Static Standing Balance Rhomberg - Eyes Opened: 30  Rhomberg -  Eyes  Closed: 5  (sways ant-post with step taken backwards to catch her balanc) Dynamic Standing Balance Dynamic Standing - Balance Support: No upper extremity supported Dynamic Standing - Level of Assistance: 4: Min assist Dynamic Standing - Balance Activities: Forward lean/weight shifting;Reaching for objects Exercise    End of Session PT - End of Session Equipment Utilized During Treatment: Gait belt Activity Tolerance: Patient tolerated treatment well Patient left: in chair;with call bell in reach (pt told to call for assist; to not get up alone) Nurse Communication: Mobility status for transfers;Mobility status for ambulation General Behavior During Session: [facility] for tasks performed  Doyel Mulkern 08/18/2011, 2:15 PM Pager [phone]

## 2011-08-18 NOTE — Progress Notes (Signed)
Subjective: Patient is a 71 year old African American female, with a significant past medical history of diabetes, hypothyroidism, chronic pain syndrome (shoulder pain, chronic back pain, osteoarthritis in the knees,) anxiety with depression-on multiple medications was brought by EMS to the hospital for the above noted complaints. Basal creatinine 3.2   She mixed up her sleeping meds.    Physical Exam: Blood pressure 145/74, pulse 82, temperature 98 F (36.7 C), temperature source Oral, resp. rate 20, weight 84.233 kg (185 lb 11.2 oz), SpO2 99.00%.  Patient Vitals for the past 24 hrs:  BP Temp Temp src Pulse Resp SpO2 Weight  08/18/11 0800 145/74 mmHg - - 82  20  99 % -  08/18/11 0644 131/75 mmHg 98 F (36.7 C) Oral 82  18  99 % -  08/18/11 0457 - 97.6 F (36.4 C) Oral 84  18  100 % -  08/18/11 0300 - - - - - - 84.233 kg (185 lb 11.2 oz)  08/18/11 0204 140/67 mmHg 97.7 F (36.5 C) Oral 85  18  100 % -  08/17/11 2157 181/85 mmHg - - 96  - - -  08/17/11 2156 168/92 mmHg - - 95  - - -  08/17/11 2153 146/73 mmHg - - 90  - - -  08/17/11 2027 165/84 mmHg 98.3 F (36.8 C) Oral 82  18  100 % -  08/17/11 1759 193/90 mmHg 98.2 F (36.8 C) Oral 85  18  100 % 84.3 kg (185 lb 13.6 oz)  08/17/11 1645 182/99 mmHg - - 86  16  96 % -  08/17/11 1626 164/73 mmHg - - 85  20  96 % -  08/17/11 1612 193/97 mmHg - - - - - -  08/17/11 1600 164/93 mmHg - - 73  14  94 % -  08/17/11 1500 161/76 mmHg - - 81  12  97 % -  08/17/11 1452 184/85 mmHg - - 78  18  97 % -  08/17/11 1253 147/79 mmHg - - 78  15  94 % -  08/17/11 1230 165/81 mmHg - - 73  11  93 % -  08/17/11 1100 153/70 mmHg - - 79  14  91 % -  08/17/11 1041 137/67 mmHg - - 77  11  92 % -    01/11 0701 - 01/12 0700 In: 656.3 [I.V.:656.3] Out: 150 [Urine:150]  Alert and orieneted x3 Cvs: rrr Rs: bilat upper wheezes Le: no edema  Investigations:  No results found for this or any previous visit (from the past 240 hour(s)).   Basic  Metabolic Panel:  Basename 08/18/11 0630 08/17/11 2017 08/17/11 0906  NA 137 -- 136  K 4.6 -- 4.6  CL 103 -- 102  CO2 22 -- 21  GLUCOSE 129* -- 118*  BUN 78* -- 82*  CREATININE 4.48* 4.85* --  CALCIUM 8.3* -- 8.5  MG -- -- --  PHOS -- -- --   Liver Function Tests:  Basename 08/18/11 0630 08/17/11 0906  AST 19 16  ALT 21 20  ALKPHOS 77 81  BILITOT 0.1* 0.1*  PROT 6.2 6.8  ALBUMIN 2.9* 3.2*     CBC:  Basename 08/18/11 0630 08/17/11 2017 08/17/11 0906  WBC 8.4 10.3 --  NEUTROABS -- -- 9.0*  HGB 11.0* 11.5* --  HCT 31.8* 33.4* --  MCV 89.8 89.5 --  PLT 206 202 --    Dg Chest 2 View  08/17/2011  *RADIOLOGY REPORT*  Clinical Data: Wheezing.  Weakness.  CHEST - 2 VIEW  Comparison: Single view of the chest 09/05/2010 and PA and lateral chest 08/15/2010.  Findings: Again seen is mild elevation of the left hemidiaphragm. Right basilar atelectasis has improved.  Patchy airspace disease in the left lung base persists.  No pneumothorax identified.  There is no pleural effusion.  IMPRESSION: Patchy left basilar airspace disease could be due to atelectasis or pneumonia.  Right basilar atelectasis has resolved.  Original Report Authenticated By: Bernadene Bell. Maricela Curet, M.D.   Ct Head Wo Contrast  08/17/2011  *RADIOLOGY REPORT*  Clinical Data: Unsteady gait.  Status post multiple falls.  CT HEAD WITHOUT CONTRAST  Technique:  Contiguous axial images were obtained from the base of the skull through the vertex without contrast.  Comparison: Brain MRI 01/31/2010.  Findings: No evidence of acute intracranial abnormality including acute infarction, hemorrhage, mass lesion, mass effect, midline shift or abnormal extra-axial fluid collection is identified. There is no hydrocephalus or pneumocephalus.  The patient has some chronic microvascular ischemic change.  Infiltration of subcutaneous fat over the left occipital bone is compatible with hematoma.  No underlying fracture is identified.  IMPRESSION:  Scalp hematoma in the left occipital bone.  No acute intracranial abnormality.  Original Report Authenticated By: Bernadene Bell. Maricela Curet, M.D.      Medications:  Scheduled:    . sodium chloride   Intravenous STAT  . docusate sodium  100 mg Oral BID  . ferrous sulfate  325 mg Oral BID WC  . furosemide  80 mg Oral BID  . glimepiride  1 mg Oral QAC breakfast  . heparin  5,000 Units Subcutaneous Q8H  . insulin aspart  0-15 Units Subcutaneous TID WC  . levofloxacin  250 mg Oral Q24H  . levothyroxine  125 mcg Oral Daily  . pantoprazole  40 mg Oral Q1200  . senna  1 tablet Oral BID  . simvastatin  20 mg Oral Daily  . DISCONTD: albuterol  2.5 mg Nebulization Q6H  . DISCONTD: ipratropium  0.5 mg Nebulization Q6H  . DISCONTD: QUEtiapine  100 mg Oral QHS    Impression:  Principal Problem:  *Generalized weakness Active Problems:  HYPOTHYROIDISM  DIABETES MELLITUS, TYPE II  HYPERLIPIDEMIA  ANEMIA-NOS  ANXIETY  DEPRESSION  PERIPHERAL NEUROPATHY  HYPERTENSION  COPD  BACK PAIN  Gait instability  UTI (lower urinary tract infection)  Renal failure (ARF), acute on chronic     Plan: Remove seroquel Reduce the home dose of lasix Treat the bronchitis      LOS: 1 day   [name] Weimer, MD Pager: [phone] 08/18/2011, 9:35 AM

## 2011-08-19 LAB — GLUCOSE, CAPILLARY

## 2011-08-19 LAB — CBC
MCH: 30.8 pg (ref 26.0–34.0)
Platelets: 218 10*3/uL (ref 150–400)
RBC: 3.96 MIL/uL (ref 3.87–5.11)
RDW: 16.2 % — ABNORMAL HIGH (ref 11.5–15.5)

## 2011-08-19 LAB — BASIC METABOLIC PANEL
Calcium: 8.8 mg/dL (ref 8.4–10.5)
GFR calc non Af Amer: 10 mL/min — ABNORMAL LOW (ref >90)
Glucose, Bld: 99 mg/dL (ref 70–99)
Sodium: 137 mEq/L (ref 135–145)

## 2011-08-19 MED ORDER — TRAZODONE HCL 100 MG PO TABS: 50.0000 mg | ORAL_TABLET | Freq: Every evening | ORAL | Status: DC | PRN

## 2011-08-19 MED ORDER — LEVOFLOXACIN 250 MG PO TABS
250.0000 mg | ORAL_TABLET | ORAL | Status: DC
  Administered 2011-08-19: 250 mg via ORAL
  Filled 2011-08-19 (×2): qty 1

## 2011-08-19 MED ORDER — FUROSEMIDE 80 MG PO TABS: 80.0000 mg | ORAL_TABLET | Freq: Two times a day (BID) | ORAL | Status: DC

## 2011-08-19 MED ORDER — HYDRALAZINE HCL 20 MG/ML IJ SOLN
5.0000 mg | Freq: Once | INTRAMUSCULAR | Status: AC
  Administered 2011-08-19: 5 mg via INTRAVENOUS
  Filled 2011-08-19: qty 0.25

## 2011-08-19 MED ORDER — ALBUTEROL SULFATE HFA 108 (90 BASE) MCG/ACT IN AERS: 1.0000 | INHALATION_SPRAY | RESPIRATORY_TRACT | Status: DC | PRN

## 2011-08-19 NOTE — Progress Notes (Signed)
Subjective: Patient is a 71 year old African American female, with a significant past medical history of diabetes, hypothyroidism, chronic pain syndrome (shoulder pain, chronic back pain, osteoarthritis in the knees,) anxiety with depression-on multiple medications was brought by EMS to the hospital for the above noted complaints. Basal creatinine 3.2    No further confusion   Physical Exam: Blood pressure 175/92, pulse 85, temperature 97.8 F (36.6 C), temperature source Oral, resp. rate 18, weight 83.1 kg (183 lb 3.2 oz), SpO2 95.00%.  Patient Vitals for the past 24 hrs:  BP Temp Temp src Pulse Resp SpO2 Weight  08/19/11 0830 175/92 mmHg - - 85  18  95 % -  08/19/11 0729 180/88 mmHg - - 82  18  94 % -  08/19/11 0549 - - - - - - 83.1 kg (183 lb 3.2 oz)  08/19/11 0540 171/83 mmHg - - 76  - - -  08/19/11 0357 170/83 mmHg 97.8 F (36.6 C) - 79  20  94 % -  08/18/11 2042 170/78 mmHg 98.9 F (37.2 C) - 84  20  98 % -  08/18/11 1400 118/82 mmHg 99.5 F (37.5 C) Oral 90  18  98 % -    01/12 0701 - 01/13 0700 In: 1760 [P.O.:1760] Out: 3500 [Urine:3500]  Alert and oriented x3 CVS: RRR RS: CTAB  Investigations:  No results found for this or any previous visit (from the past 240 hour(s)).   Basic Metabolic Panel:  Basename 08/19/11 0725 08/18/11 0630  NA 137 137  K 4.7 4.6  CL 104 103  CO2 21 22  GLUCOSE 99 129*  BUN 76* 78*  CREATININE 4.04* 4.48*  CALCIUM 8.8 8.3*  MG -- --  PHOS -- --   Liver Function Tests:  Basename 08/18/11 0630 08/17/11 0906  AST 19 16  ALT 21 20  ALKPHOS 77 81  BILITOT 0.1* 0.1*  PROT 6.2 6.8  ALBUMIN 2.9* 3.2*     CBC:  Basename 08/19/11 0725 08/18/11 0630 08/17/11 0906  WBC 9.3 8.4 --  NEUTROABS -- -- 9.0*  HGB 12.2 11.0* --  HCT 35.1* 31.8* --  MCV 88.6 89.8 --  PLT 218 206 --    Dg Chest 2 View  08/17/2011  *RADIOLOGY REPORT*  Clinical Data: Wheezing.  Weakness.  CHEST - 2 VIEW  Comparison: Single view of the chest  09/05/2010 and PA and lateral chest 08/15/2010.  Findings: Again seen is mild elevation of the left hemidiaphragm. Right basilar atelectasis has improved.  Patchy airspace disease in the left lung base persists.  No pneumothorax identified.  There is no pleural effusion.  IMPRESSION: Patchy left basilar airspace disease could be due to atelectasis or pneumonia.  Right basilar atelectasis has resolved.  Original Report Authenticated By: Bernadene Bell. Maricela Curet, M.D.   Ct Head Wo Contrast  08/17/2011  *RADIOLOGY REPORT*  Clinical Data: Unsteady gait.  Status post multiple falls.  CT HEAD WITHOUT CONTRAST  Technique:  Contiguous axial images were obtained from the base of the skull through the vertex without contrast.  Comparison: Brain MRI 01/31/2010.  Findings: No evidence of acute intracranial abnormality including acute infarction, hemorrhage, mass lesion, mass effect, midline shift or abnormal extra-axial fluid collection is identified. There is no hydrocephalus or pneumocephalus.  The patient has some chronic microvascular ischemic change.  Infiltration of subcutaneous fat over the left occipital bone is compatible with hematoma.  No underlying fracture is identified.  IMPRESSION: Scalp hematoma in the left occipital bone.  No  acute intracranial abnormality.  Original Report Authenticated By: Bernadene Bell. Maricela Curet, M.D.   Mr Brain Wo Contrast  08/18/2011  *RADIOLOGY REPORT*  Clinical Data: 71 year old female with falls, weakness, unsteadiness.  MRI HEAD WITHOUT CONTRAST  Technique:  Multiplanar, multiecho pulse sequences of the brain and surrounding structures were obtained according to standard protocol without intravenous contrast.  Comparison: Head CT 08/17/2011.  Brain MRI 01/31/2010 and earlier.  Findings: No restricted diffusion to suggest acute infarction.  No midline shift, mass effect, evidence of mass lesion, ventriculomegaly, extra-axial collection or acute intracranial hemorrhage.  Cervicomedullary  junction and pituitary are within normal limits.  Major intracranial vascular flow voids are preserved.  The posterior circulation is again seen to be diminutive as a consequence of PCA supply from the carotids.  Stable gray and white matter signal throughout the brain ; mild for age nonspecific white matter changes.  Negative for age visualized cervical spine.Visualized bone marrow signal is within normal limits.  Stable orbits.  Minor paranasal sinus mucosal thickening.  Mastoids are clear.  Grossly normal and stable visualized internal auditory structures.  Negative visualized scalp soft tissues.  IMPRESSION: No acute intracranial abnormality.  Stable and negative for age noncontrast MRI appearance of the brain.  Original Report Authenticated By: Harley Hallmark, M.D.      Medications:  Scheduled:    . sodium chloride   Intravenous STAT  . docusate sodium  100 mg Oral BID  . ferrous sulfate  325 mg Oral BID WC  . furosemide  80 mg Oral BID  . glimepiride  1 mg Oral QAC breakfast  . heparin  5,000 Units Subcutaneous Q8H  . hydrALAZINE  5 mg Intravenous Once  . insulin aspart  0-15 Units Subcutaneous TID WC  . levofloxacin  250 mg Oral Q24H  . levothyroxine  125 mcg Oral Daily  . pantoprazole  40 mg Oral Q1200  . senna  1 tablet Oral BID  . simvastatin  20 mg Oral Daily  . DISCONTD: albuterol  2.5 mg Nebulization Q6H  . DISCONTD: ipratropium  0.5 mg Nebulization Q6H  . DISCONTD: levofloxacin  250 mg Oral Q24H  . DISCONTD: QUEtiapine  100 mg Oral QHS    Impression:  Principal Problem:  *Generalized weakness Active Problems:  HYPOTHYROIDISM  DIABETES MELLITUS, TYPE II  HYPERLIPIDEMIA  ANEMIA-NOS  ANXIETY  DEPRESSION  PERIPHERAL NEUROPATHY  HYPERTENSION  COPD  BACK PAIN  Gait instability  UTI (lower urinary tract infection)  Renal failure (ARF), acute on chronic     Plan:  D/c home with close FU with renal and PCP    LOS: 2 days   Ellina Sivertsen, MD Pager:  [phone] 08/19/2011, 9:13 AM

## 2011-08-19 NOTE — Discharge Summary (Signed)
Patient ID: [name] [name] MRN: [id number] DOB/AGE: 72/24/1942 72 y.o. Primary Care Physician:James Jonny Ruiz, MD, MD Admit date: 08/17/2011 Discharge date: 08/19/2011    Discharge Diagnoses:  Increased lethargy and falls probably due to polypharmacy - resolved Principal Problem:  *Generalized weakness Active Problems:  HYPOTHYROIDISM  DIABETES MELLITUS, TYPE II  HYPERLIPIDEMIA  ANEMIA-NOS  ANXIETY  DEPRESSION  PERIPHERAL NEUROPATHY  HYPERTENSION  COPD  BACK PAIN  Gait instability  Renal failure (ARF), acute on chronic   Medication List  As of 08/19/2011  3:31 PM   START taking these medications         albuterol 108 (90 BASE) MCG/ACT inhaler   Commonly known as: PROVENTIL HFA;VENTOLIN HFA   Inhale 1 puff into the lungs every 4 (four) hours as needed for wheezing or shortness of breath.         CHANGE how you take these medications         furosemide 80 MG tablet   Commonly known as: LASIX   Take 1 tablet (80 mg total) by mouth 2 (two) times daily.   What changed: how often to take the med      traZODone 100 MG tablet   Commonly known as: DESYREL   Take 0.5 tablets (50 mg total) by mouth at bedtime as needed. For sleep   What changed: dose         CONTINUE taking these medications         atorvastatin 40 MG tablet   Commonly known as: LIPITOR   Take 1 tablet (40 mg total) by mouth daily.      clonazePAM 2 MG tablet   Commonly known as: KLONOPIN   Take 1 tablet (2 mg total) by mouth 2 (two) times daily as needed for anxiety.      cyclobenzaprine 5 MG tablet   Commonly known as: FLEXERIL      ferrous sulfate 325 (65 FE) MG tablet   1 tab by mouth twice per day      fluticasone 50 MCG/ACT nasal spray   Commonly known as: FLONASE      glimepiride 1 MG tablet   Commonly known as: AMARYL      HYDROcodone-acetaminophen 5-500 MG per tablet   Commonly known as: VICODIN   Take 1 tablet by mouth every 8 (eight) hours as needed for pain.      levocetirizine 5 MG  tablet   Commonly known as: XYZAL      levothyroxine 125 MCG tablet   Commonly known as: SYNTHROID, LEVOTHROID   Take 1 tablet (125 mcg total) by mouth daily.      omeprazole 40 MG capsule   Commonly known as: PRILOSEC      tiotropium 18 MCG inhalation capsule   Commonly known as: SPIRIVA         STOP taking these medications         Diphenhyd-Hydrocort-Nystatin Susp      hydrOXYzine 25 MG tablet      promethazine 25 MG tablet      QUEtiapine 100 MG tablet          Where to get your medications    These are the prescriptions that you need to pick up.   You may get these medications from any pharmacy.         albuterol 108 (90 BASE) MCG/ACT inhaler         Information on where to get these meds is not yet available. Ask your nurse  or doctor.         furosemide 80 MG tablet   traZODone 100 MG tablet            Discharged Condition:fair. Patient is alert and oriented x3. She has been set up with home health physical therapy, occupational therapy, registered nurse and home health aide.    Consults:none  Significant Diagnostic Studies: Dg Chest 2 View  08/17/2011  *RADIOLOGY REPORT*  Clinical Data: Wheezing.  Weakness.  CHEST - 2 VIEW  Comparison: Single view of the chest 09/05/2010 and PA and lateral chest 08/15/2010.  Findings: Again seen is mild elevation of the left hemidiaphragm. Right basilar atelectasis has improved.  Patchy airspace disease in the left lung base persists.  No pneumothorax identified.  There is no pleural effusion.  IMPRESSION: Patchy left basilar airspace disease could be due to atelectasis or pneumonia.  Right basilar atelectasis has resolved.  Original Report Authenticated By: Bernadene Bell. Maricela Curet, M.D.   Ct Head Wo Contrast  08/17/2011  *RADIOLOGY REPORT*  Clinical Data: Unsteady gait.  Status post multiple falls.  CT HEAD WITHOUT CONTRAST  Technique:  Contiguous axial images were obtained from the base of the skull through the vertex  without contrast.  Comparison: Brain MRI 01/31/2010.  Findings: No evidence of acute intracranial abnormality including acute infarction, hemorrhage, mass lesion, mass effect, midline shift or abnormal extra-axial fluid collection is identified. There is no hydrocephalus or pneumocephalus.  The patient has some chronic microvascular ischemic change.  Infiltration of subcutaneous fat over the left occipital bone is compatible with hematoma.  No underlying fracture is identified.  IMPRESSION: Scalp hematoma in the left occipital bone.  No acute intracranial abnormality.  Original Report Authenticated By: Bernadene Bell. Maricela Curet, M.D.   Mr Brain Wo Contrast  08/18/2011  *RADIOLOGY REPORT*  Clinical Data: 71 year old female with falls, weakness, unsteadiness.  MRI HEAD WITHOUT CONTRAST  Technique:  Multiplanar, multiecho pulse sequences of the brain and surrounding structures were obtained according to standard protocol without intravenous contrast.  Comparison: Head CT 08/17/2011.  Brain MRI 01/31/2010 and earlier.  Findings: No restricted diffusion to suggest acute infarction.  No midline shift, mass effect, evidence of mass lesion, ventriculomegaly, extra-axial collection or acute intracranial hemorrhage.  Cervicomedullary junction and pituitary are within normal limits.  Major intracranial vascular flow voids are preserved.  The posterior circulation is again seen to be diminutive as a consequence of PCA supply from the carotids.  Stable gray and white matter signal throughout the brain ; mild for age nonspecific white matter changes.  Negative for age visualized cervical spine.Visualized bone marrow signal is within normal limits.  Stable orbits.  Minor paranasal sinus mucosal thickening.  Mastoids are clear.  Grossly normal and stable visualized internal auditory structures.  Negative visualized scalp soft tissues.  IMPRESSION: No acute intracranial abnormality.  Stable and negative for age noncontrast MRI  appearance of the brain.  Original Report Authenticated By: Harley Hallmark, M.D.    Lab Results: Results for orders placed during the hospital encounter of 08/17/11 (from the past 48 hour(s))  CBC     Status: Abnormal   Collection Time   08/17/11  8:17 PM      Component Value Range Comment   WBC 10.3  4.0 - 10.5 (K/uL)    RBC 3.73 (*) 3.87 - 5.11 (MIL/uL)    Hemoglobin 11.5 (*) 12.0 - 15.0 (g/dL)    HCT 29.5 (*) 62.1 - 46.0 (%)    MCV 89.5  78.0 -  100.0 (fL)    MCH 30.8  26.0 - 34.0 (pg)    MCHC 34.4  30.0 - 36.0 (g/dL)    RDW 40.9 (*) 81.1 - 15.5 (%)    Platelets 202  150 - 400 (K/uL)   CREATININE, SERUM     Status: Abnormal   Collection Time   08/17/11  8:17 PM      Component Value Range Comment   Creatinine, Ser 4.85 (*) 0.50 - 1.10 (mg/dL)    GFR calc non Af Amer 8 (*) >90 (mL/min)    GFR calc Af Amer 10 (*) >90 (mL/min)   HEMOGLOBIN A1C     Status: Abnormal   Collection Time   08/17/11  8:17 PM      Component Value Range Comment   Hemoglobin A1C 6.6 (*) <5.7 (%)    Mean Plasma Glucose 143 (*) <117 (mg/dL)   GLUCOSE, CAPILLARY     Status: Abnormal   Collection Time   08/17/11  8:58 PM      Component Value Range Comment   Glucose-Capillary 124 (*) 70 - 99 (mg/dL)    Comment 1 Notify RN      Comment 2 Documented in Chart     TSH     Status: Normal   Collection Time   08/18/11  6:30 AM      Component Value Range Comment   TSH 1.009  0.350 - 4.500 (uIU/mL)   CBC     Status: Abnormal   Collection Time   08/18/11  6:30 AM      Component Value Range Comment   WBC 8.4  4.0 - 10.5 (K/uL)    RBC 3.54 (*) 3.87 - 5.11 (MIL/uL)    Hemoglobin 11.0 (*) 12.0 - 15.0 (g/dL)    HCT 91.4 (*) 78.2 - 46.0 (%)    MCV 89.8  78.0 - 100.0 (fL)    MCH 31.1  26.0 - 34.0 (pg)    MCHC 34.6  30.0 - 36.0 (g/dL)    RDW 95.6 (*) 21.3 - 15.5 (%)    Platelets 206  150 - 400 (K/uL)   COMPREHENSIVE METABOLIC PANEL     Status: Abnormal   Collection Time   08/18/11  6:30 AM      Component Value Range  Comment   Sodium 137  135 - 145 (mEq/L)    Potassium 4.6  3.5 - 5.1 (mEq/L)    Chloride 103  96 - 112 (mEq/L)    CO2 22  19 - 32 (mEq/L)    Glucose, Bld 129 (*) 70 - 99 (mg/dL)    BUN 78 (*) 6 - 23 (mg/dL)    Creatinine, Ser 0.86 (*) 0.50 - 1.10 (mg/dL)    Calcium 8.3 (*) 8.4 - 10.5 (mg/dL)    Total Protein 6.2  6.0 - 8.3 (g/dL)    Albumin 2.9 (*) 3.5 - 5.2 (g/dL)    AST 19  0 - 37 (U/L)    ALT 21  0 - 35 (U/L)    Alkaline Phosphatase 77  39 - 117 (U/L)    Total Bilirubin 0.1 (*) 0.3 - 1.2 (mg/dL)    GFR calc non Af Amer 9 (*) >90 (mL/min)    GFR calc Af Amer 10 (*) >90 (mL/min)   GLUCOSE, CAPILLARY     Status: Abnormal   Collection Time   08/18/11  6:41 AM      Component Value Range Comment   Glucose-Capillary 136 (*) 70 - 99 (mg/dL)  Comment 1 Documented in Chart      Comment 2 Notify RN     GLUCOSE, CAPILLARY     Status: Abnormal   Collection Time   08/18/11 11:33 AM      Component Value Range Comment   Glucose-Capillary 185 (*) 70 - 99 (mg/dL)    Comment 1 Documented in Chart      Comment 2 Notify RN     GLUCOSE, CAPILLARY     Status: Normal   Collection Time   08/18/11  4:08 PM      Component Value Range Comment   Glucose-Capillary 93  70 - 99 (mg/dL)    Comment 1 Documented in Chart      Comment 2 Notify RN     GLUCOSE, CAPILLARY     Status: Normal   Collection Time   08/18/11  9:06 PM      Component Value Range Comment   Glucose-Capillary 74  70 - 99 (mg/dL)    Comment 1 Notify RN     GLUCOSE, CAPILLARY     Status: Normal   Collection Time   08/19/11  5:59 AM      Component Value Range Comment   Glucose-Capillary 71  70 - 99 (mg/dL)    Comment 1 Notify RN     BASIC METABOLIC PANEL     Status: Abnormal   Collection Time   08/19/11  7:25 AM      Component Value Range Comment   Sodium 137  135 - 145 (mEq/L)    Potassium 4.7  3.5 - 5.1 (mEq/L)    Chloride 104  96 - 112 (mEq/L)    CO2 21  19 - 32 (mEq/L)    Glucose, Bld 99  70 - 99 (mg/dL)    BUN 76 (*) 6 - 23  (mg/dL)    Creatinine, Ser 1.61 (*) 0.50 - 1.10 (mg/dL)    Calcium 8.8  8.4 - 10.5 (mg/dL)    GFR calc non Af Amer 10 (*) >90 (mL/min)    GFR calc Af Amer 12 (*) >90 (mL/min)   CBC     Status: Abnormal   Collection Time   08/19/11  7:25 AM      Component Value Range Comment   WBC 9.3  4.0 - 10.5 (K/uL)    RBC 3.96  3.87 - 5.11 (MIL/uL)    Hemoglobin 12.2  12.0 - 15.0 (g/dL)    HCT 09.6 (*) 04.5 - 46.0 (%)    MCV 88.6  78.0 - 100.0 (fL)    MCH 30.8  26.0 - 34.0 (pg)    MCHC 34.8  30.0 - 36.0 (g/dL)    RDW 40.9 (*) 81.1 - 15.5 (%)    Platelets 218  150 - 400 (K/uL)    No results found for this or any previous visit (from the past 240 hour(s)).   Hospital Course: 70 year old woman with multiple medical problems, was admitted from the emergency room for complaints of increased lethargy and multiple falls. It was also noted that her renal failure was slightly worse. The patient went to her primary care physician and complained of insomnia one week prior to admission. She was prescribed Seroquel and trazodone. The morning of admission she was found on the floor too weak to get up by the transportation services. Patient says that the night admission she may have taken mistakenly too much of the sleeping pills. She was observed overnight and by hospital day 2 she was without any encephalopathy  or lethargy. She was taken off seroquel. It was noted that her creatinine was slightly worse. We decreased the dose of lasix and the creatinine improved to 4.0 prior to discharge. Patient was evaluated by PT and was felt safe to DC home with home health. She will f/u with [facility] also this week for the CKD stage IV.   Discharge Exam: Blood pressure 165/82, pulse 95, temperature 98.6 F (37 C), temperature source Oral, resp. rate 22, weight 83.1 kg (183 lb 3.2 oz), SpO2 97.00%. Alert and oriented x3 Able to transfer from bed to chair on her own CVS: RRR RS: CTAB  Disposition: home  Discharge Orders     Future Appointments: Provider: Department: Dept Phone: Center:   08/21/2011 1:45 PM Barbaraann Share, MD [facility] [phone] None   02/08/2012 1:30 PM Oliver Barre, MD [facility] [phone] [facility]     Future Orders Please Complete By Expires   Diet Carb Modified      Increase activity slowly         Follow-up Information    Follow up with Oliver Barre, MD. Schedule an appointment as soon as possible for a visit in 1 week.   Contact information:   [address] [facility] [address] [place] [address] [phone]       Follow up with Lauris Poag, MD. (as previously scheduled this week)    Contact information:   [address] [employer] [place] [address] [phone]       Follow up with [facility]. [facility] RN, aide, Physical Therapy and Occupational Therapy)    Contact information:   [phone]         Signed: Jayson Waterhouse 08/19/2011, 3:31 PM

## 2011-08-19 NOTE — Progress Notes (Signed)
   CARE MANAGEMENT NOTE 08/19/2011  Patient:  [name] [name], [name] [name]   Account Number:  [id number]  Date Initiated:  08/19/2011  Documentation initiated by:  [facility]  Subjective/Objective Assessment:   chronic pain, DM, ARF     Action/Plan:   Anticipated DC Date:  08/19/2011   Anticipated DC Plan:  HOME W HOME HEALTH SERVICES      DC Planning Services  CM consult      [facility] Choice  HOME HEALTH   Choice offered to / List presented to:  C-1 Patient        HH arranged  HH-1 RN  HH-2 PT  HH-3 OT  HH-4 NURSE'S AIDE      HH agency  [facility]   Status of service:  Completed, signed off Medicare Important Message given?   (If response is "NO", the following Medicare IM given date fields will be blank) Date Medicare IM given:   Date Additional Medicare IM given:    Discharge Disposition:  HOME W HOME HEALTH SERVICES  Per UR Regulation:    Comments:  08/19/2011 1445 Pt states she has used [facility] in the past and requested their services for [facility]. Contacted [facility] for [facility] PT, RN, OT and aide for scheduled d/c today. Isidoro Donning RN CCM Case Mgmt phone [phone]

## 2011-08-21 ENCOUNTER — Other Ambulatory Visit: Payer: Self-pay | Admitting: Internal Medicine

## 2011-08-21 ENCOUNTER — Ambulatory Visit (INDEPENDENT_AMBULATORY_CARE_PROVIDER_SITE_OTHER): Payer: Medicare Other | Admitting: Pulmonary Disease

## 2011-08-21 ENCOUNTER — Other Ambulatory Visit (INDEPENDENT_AMBULATORY_CARE_PROVIDER_SITE_OTHER): Payer: Medicare Other

## 2011-08-21 ENCOUNTER — Encounter: Payer: Self-pay | Admitting: Pulmonary Disease

## 2011-08-21 DIAGNOSIS — J209 Acute bronchitis, unspecified: Secondary | ICD-10-CM

## 2011-08-21 DIAGNOSIS — J449 Chronic obstructive pulmonary disease, unspecified: Secondary | ICD-10-CM

## 2011-08-21 DIAGNOSIS — E119 Type 2 diabetes mellitus without complications: Secondary | ICD-10-CM

## 2011-08-21 DIAGNOSIS — J4489 Other specified chronic obstructive pulmonary disease: Secondary | ICD-10-CM

## 2011-08-21 LAB — BASIC METABOLIC PANEL
CO2: 24 mEq/L (ref 19–32)
Calcium: 8.9 mg/dL (ref 8.4–10.5)
Creatinine, Ser: 4.7 mg/dL (ref 0.4–1.2)
Glucose, Bld: 69 mg/dL — ABNORMAL LOW (ref 70–99)

## 2011-08-21 LAB — LIPID PANEL
Cholesterol: 250 mg/dL — ABNORMAL HIGH (ref 0–200)
HDL: 84.7 mg/dL (ref >39.00)
Triglycerides: 291 mg/dL — ABNORMAL HIGH (ref 0.0–149.0)

## 2011-08-21 MED ORDER — MOMETASONE FURO-FORMOTEROL FUM 100-5 MCG/ACT IN AERO: 2.0000 | INHALATION_SPRAY | Freq: Two times a day (BID) | RESPIRATORY_TRACT | Status: DC

## 2011-08-21 MED ORDER — CEFDINIR 300 MG PO CAPS: 600.0000 mg | ORAL_CAPSULE | Freq: Every day | ORAL | Status: AC

## 2011-08-21 NOTE — Patient Instructions (Signed)
Stop spiriva, and start dulera 100/5  2 inhalations am and pm everyday.  Rinse mouth well.  If you were to quit smoking, you would not require this medication Ok to use your albuterol for rescue only Will treat with omnicef for your bronchitis. Stop smoking followup with me in 6mos.

## 2011-08-21 NOTE — Progress Notes (Signed)
Addended by: Salli Quarry on: 08/21/2011 02:40 PM   Modules accepted: Orders

## 2011-08-21 NOTE — Progress Notes (Signed)
  Subjective:    Patient ID: [name] [name] Patient, female    DOB: [birth year], 71 y.o.   MRN: [id number]  HPI The patient comes in today for pulmonary followup after a recent admission to the hospital for hypersomnia associated with medication interaction.  During that time she was found to have a questionable lower lobe infiltrate versus atelectasis, but apparently was felt to be the latter.  She was not sent home on antibiotics.  She has known minimal airflow obstruction, but issues with ongoing airway inflammation secondary to smoking.  She feels that she is currently congested and is coughing up purulent mucus.  Unfortunately, she is continuing to smoke.   Review of Systems  Constitutional: Negative for fever and unexpected weight change.  HENT: Positive for congestion, rhinorrhea, postnasal drip and sinus pressure. Negative for ear pain, nosebleeds, sore throat, sneezing, trouble swallowing and dental problem.   Eyes: Negative for redness and itching.  Respiratory: Positive for cough, shortness of breath and wheezing. Negative for chest tightness.   Cardiovascular: Negative for palpitations and leg swelling.  Gastrointestinal: Negative for nausea and vomiting.  Genitourinary: Negative for dysuria.  Musculoskeletal: Negative for joint swelling.  Skin: Negative for rash.  Neurological: Positive for headaches.  Hematological: Does not bruise/bleed easily.  Psychiatric/Behavioral: Negative for dysphoric mood. The patient is not nervous/anxious.        Objective:   Physical Exam Well developed female in no acute distress Nose without purulence or discharge noted Chest with mildly decreased breath sounds, no wheezes or rhonchi ,adequate air flow.  Very prominent upper airway pseudo wheezing. Cardiac exam with regular rate and rhythm Lower extremities with minimal edema, no cyanosis noted Alert and oriented, moves all 4 extremities.       Assessment & Plan:

## 2011-08-21 NOTE — Assessment & Plan Note (Signed)
The pt has minimal copd by previous pfts, and would do quite well if she would simply stop smoking. She has excellent saturations today on room air, and has no increased work of breathing.  She is not wheezing on exam.  Given her ongoing smoking, and a tendency to have ongoing airway inflammation associated with this, would suggest changing her Spiriva to dulera.  If she would quit smoking, she would need no maintenance bronchodilator.

## 2011-08-21 NOTE — Assessment & Plan Note (Signed)
The patient was found to have an infiltrate versus atelectasis on her chest x-ray during her hospitalization, but she was not felt to have pneumonia clinically.  She was not discharged home on antibiotics.  She currently states that she is coughing up purulent mucus and is congested, and therefore we'll start her on antibiotics for a short course.

## 2011-08-28 ENCOUNTER — Telehealth: Payer: Self-pay | Admitting: Pulmonary Disease

## 2011-08-28 NOTE — Telephone Encounter (Signed)
Spoke with pt and notified of recs per [facility]. She verbalized understanding and states nothing further needed.  Will call if not improving soon.

## 2011-08-28 NOTE — Telephone Encounter (Signed)
Spoke with pt. She is requesting omnicef refill Was seen last and given this med on 08/21/11  Cough is about the same- no better or worse, prod with minimal brown to yellow sputum Breathing improved since last visit Denies f/c/s, wheeze, CP, or other c/o's Still smoking- down to 3 cigs per day. Please advise, thanks! Allergies  Allergen Reactions  . Nsaids     REACTION: renal dysfunction  . Pioglitazone     Actos REACTION: EDEMA

## 2011-08-28 NOTE — Telephone Encounter (Signed)
Let her know that 5 days of abx is enough to treat a bronchitis.  She probably has ongoing cough due to airway inflammation associated with her ongoing smoking.  This may keep the mucus from totally clearing, and can certainly drive the cough.  Would like to give this a little more time, and really try to quit smoking 100%.  If the cough and mucus continue, let me know.

## 2011-08-30 ENCOUNTER — Telehealth: Payer: Self-pay | Admitting: Pulmonary Disease

## 2011-08-30 NOTE — Telephone Encounter (Signed)
Spoke with pt. She is requesting omnicef refill . Was seen last and given this med on 08/21/11. She is coughing w/ minimal brown to yellow sputum, hoarseness, nasal congestion. Denies and fever, chills, sweats, body aches. Pt states her daughter is going out to pick her up some OTC medications to help her. Pt advised me that she has not touched a cigarette since OCT but has been using electronic cigarettes. According to last phone note on 08/28/11 she advised she was only smoking 3 cigs a day--She states she did not say that. I advised pt [facility] was not in but will return tomorrow morning bc he wanted to wait and see how she does since she just finished an abx.  She was fine with this. Please advise Dr. Shelle Iron, thanks  Allergies  Allergen Reactions  . Nsaids     REACTION: renal dysfunction  . Pioglitazone     Actos REACTION: EDEMA

## 2011-08-31 NOTE — Telephone Encounter (Signed)
I spoke with pt and advised her of [facility] recs. She voiced her understanding and will call back if she gets worse

## 2011-08-31 NOTE — Telephone Encounter (Signed)
I agree with OTC meds to help with SYMPTOM relief.  Would really like to avoid further abx unless she is having large quantities of mucus that is nasty looking.  She is to call if getting worse.

## 2011-09-06 ENCOUNTER — Ambulatory Visit: Payer: Medicare Other | Admitting: Vascular Surgery

## 2011-09-07 DIAGNOSIS — J449 Chronic obstructive pulmonary disease, unspecified: Secondary | ICD-10-CM

## 2011-09-07 DIAGNOSIS — R269 Unspecified abnormalities of gait and mobility: Secondary | ICD-10-CM

## 2011-09-07 DIAGNOSIS — M6281 Muscle weakness (generalized): Secondary | ICD-10-CM

## 2011-09-07 DIAGNOSIS — E119 Type 2 diabetes mellitus without complications: Secondary | ICD-10-CM

## 2011-09-18 ENCOUNTER — Encounter: Payer: Self-pay | Admitting: Vascular Surgery

## 2011-09-19 ENCOUNTER — Ambulatory Visit (INDEPENDENT_AMBULATORY_CARE_PROVIDER_SITE_OTHER): Payer: Medicare Other | Admitting: Vascular Surgery

## 2011-09-19 ENCOUNTER — Encounter: Payer: Self-pay | Admitting: Vascular Surgery

## 2011-09-19 ENCOUNTER — Encounter (INDEPENDENT_AMBULATORY_CARE_PROVIDER_SITE_OTHER): Payer: Medicare Other | Admitting: *Deleted

## 2011-09-19 VITALS — BP 190/90 | HR 88 | Resp 18 | Ht 67.0 in | Wt 196.1 lb

## 2011-09-19 DIAGNOSIS — N189 Chronic kidney disease, unspecified: Secondary | ICD-10-CM

## 2011-09-19 DIAGNOSIS — N186 End stage renal disease: Secondary | ICD-10-CM | POA: Insufficient documentation

## 2011-09-19 NOTE — Assessment & Plan Note (Signed)
Vein mapping suggested the forearm cephalic vein in the upper arm cephalic vein on the right are too small for a fistula. The right arm basilic vein is reasonable in size although somewhat small also. I think her best chance for a fistula is likely a right basilic vein transposition. However I will examine the vein is myself in the operating room before making that decision. I have explained the indications for placement of an AV fistula or AV graft. I've explained that if at all possible we will place an AV fistula.  I have reviewed the risks of placement of an AV fistula including but not limited to: failure of the fistula to mature, need for subsequent interventions, and thrombosis. In addition I have reviewed the potential complications of placement of an AV graft. These risks include, but are not limited to, graft thrombosis, graft infection, wound healing problems, bleeding, arm swelling, and steal syndrome. All the patient's questions were answered. She is currently not willing to schedule surgery today but will call near future to make arrangements for placement of a fistula in the right arm if at all possible.

## 2011-09-19 NOTE — Progress Notes (Signed)
Vascular and Vein Specialist of [facility]  Patient name: [name] [name] MRN: [id number] DOB: [birth year] Sex: female  REASON FOR CONSULT: evaluate for new hemodialysis access. Referred by Dr. Casimiro Needle.  HPI: [name] [name] is a 71 y.o. female who is not yet on dialysis. She is left-handed. She has stage V chronic kidney disease and was referred for evaluation for new access. She's had no recent uremic symptoms. Specifically she denies nausea, vomiting, palpitations, fatigue, or anorexia. Her main complaint has been some laryngitis recently. She's had previous border surgery complicated by hoarseness which has recently become aggravated.  Past Medical History  Diagnosis Date  . HYPOTHYROIDISM 02/17/2007  . DIABETES MELLITUS, TYPE II 02/17/2007  . HYPERLIPIDEMIA 02/17/2007  . GOUT 05/29/2007  . ANEMIA-NOS 05/29/2007  . ANXIETY 03/23/2010  . 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 DEPRESSION 02/17/2007  . RESTLESS LEG SYNDROME 05/29/2007  . COMMON MIGRAINE 05/29/2007  . PERIPHERAL NEUROPATHY 05/29/2007  . HYPERTENSION 02/17/2007  . PERIPHERAL VASCULAR DISEASE 02/17/2007  . ASTHMATIC BRONCHITIS, ACUTE 10/08/2008  . COPD 05/29/2007  . GERD 02/17/2007  . PEPTIC ULCER DISEASE 05/29/2007  . GASTROENTERITIS 12/02/2007  . RENAL INSUFFICIENCY 02/17/2007  . MENOPAUSAL DISORDER 05/25/2009  . Osteoarth NOS-L/Leg 03/11/2008  . SHOULDER PAIN, LEFT, CHRONIC 09/15/2008  . CERVICAL RADICULOPATHY, LEFT 05/27/2008  . BACK PAIN 12/12/2009  . OSTEOPENIA 09/22/2009  . SEIZURE DISORDER 02/17/2007  . HYPERSOMNIA 05/26/2010  . Memory loss 01/24/2010  . Abnormality of gait 07/30/2007  . ABDOMINAL PAIN -GENERALIZED 11/16/2009  . Personal history of colonic polyps 11/16/2009  . PERSISTENT DISORDER INITIATING/MAINTAINING SLEEP 08/22/2010  . INADEQUATE SLEEP HYGIENE 08/22/2010  . DYSPNEA 08/22/2010  . Wheezing 08/15/2010  . CHEST PAIN 08/15/2010  . CHRONIC OBSTRUCTIVE PULMONARY DISEASE, ACUTE EXACERBATION 09/08/2010  . Palpitations  09/08/2010  . Other diseases of vocal cords 09/19/2010  . HEART MURMUR, HX OF 10/02/2010  . 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 Chronic neck pain 12/13/2010  . Osteopenia 12/13/2010    Family History  Problem Relation Age of Onset  . Dementia Mother   . Coronary artery disease Other   . Hyperlipidemia Other   . Hypertension Other   . Ovarian cancer Other   . Stroke Other     SOCIAL HISTORY: History  Substance Use Topics  . Smoking status: Current Some Day Smoker -- 0.2 packs/day for 47 years    Types: Cigarettes  . Smokeless tobacco: Current User   Comment:   currently smoking 4 to 5 cigs daily.  . Alcohol Use: No    Allergies  Allergen Reactions  . Nsaids     REACTION: renal dysfunction  . Pioglitazone     Actos REACTION: EDEMA    Current Outpatient Prescriptions  Medication Sig Dispense Refill  . albuterol (PROVENTIL HFA;VENTOLIN HFA) 108 (90 BASE) MCG/ACT inhaler Inhale 1 puff into the lungs every 4 (four) hours as needed for wheezing or shortness of breath.  1 Inhaler  0  . atorvastatin (LIPITOR) 40 MG tablet Take 1 tablet (40 mg total) by mouth daily.  30 tablet  11  . clonazePAM (KLONOPIN) 2 MG tablet Take 1 tablet (2 mg total) by mouth 2 (two) times daily as needed for anxiety.  60 tablet  5  . cyclobenzaprine (FLEXERIL) 5 MG tablet Take 5 mg by mouth 3 (three) times daily as needed. For muscle spasms      . ferrous sulfate 325 (65 FE) MG tablet 1 tab by mouth twice per day  30 tablet  11  .  fluticasone (FLONASE) 50 MCG/ACT nasal spray Place 2 sprays into the nose daily.        . furosemide (LASIX) 80 MG tablet Take 1 tablet (80 mg total) by mouth 2 (two) times daily.      Marland Kitchen glimepiride (AMARYL) 1 MG tablet Take 1 mg by mouth every morning before breakfast. 1 po every day       . HYDROCODONE-ACETAMINOPHEN PO Take 7.5 mg by mouth daily.      . hydrOXYzine (ATARAX/VISTARIL) 25 MG tablet Take 25 mg by mouth as needed.      Marland Kitchen levocetirizine (XYZAL) 5 MG tablet Take 5 mg by mouth  daily.        Marland Kitchen levothyroxine (SYNTHROID, LEVOTHROID) 125 MCG tablet Take 1 tablet (125 mcg total) by mouth daily.  90 tablet  3  . mometasone-formoterol (DULERA) 100-5 MCG/ACT AERO Inhale 2 puffs into the lungs 2 (two) times daily.  1 Inhaler  6  . omeprazole (PRILOSEC) 40 MG capsule Take 40 mg by mouth daily.        . pregabalin (LYRICA) 50 MG capsule Take 100 mg by mouth daily.      . promethazine (PHENERGAN) 25 MG tablet Take 25 mg by mouth as needed.      . traZODone (DESYREL) 100 MG tablet Take 0.5 tablets (50 mg total) by mouth at bedtime as needed. For sleep      . DULoxetine (CYMBALTA) 60 MG capsule Take 60 mg by mouth daily.      Marland Kitchen tiotropium (SPIRIVA) 18 MCG inhalation capsule Place 18 mcg into inhaler and inhale daily.          REVIEW OF SYSTEMS: Arly.Keller ] denotes positive finding; [  ] denotes negative finding  CARDIOVASCULAR:  [ ]  chest pain   [ ]  chest pressure   [ ]  palpitations   Arly.Keller ] orthopnea   Arly.Keller ] dyspnea on exertion   [ ]  claudication   [ ]  rest pain   [ ]  DVT   [ ]  phlebitis PULMONARY:   Arly.Keller ] productive cough   [ ]  asthma   Arly.Keller ] wheezing NEUROLOGIC:   [ ]  weakness  [ ]  paresthesias  [ ]  aphasia  [ ]  amaurosis  [ ]  dizziness HEMATOLOGIC:   [ ]  bleeding problems   [ ]  clotting disorders MUSCULOSKELETAL:  [ ]  joint pain   [ ]  joint swelling [ ]  leg swelling GASTROINTESTINAL: [ ]   blood in stool  [ ]   hematemesis GENITOURINARY:  [ ]   dysuria  [ ]   hematuria PSYCHIATRIC:  Arly.Keller ] history of major depression INTEGUMENTARY:  [ ]  rashes  [ ]  ulcers CONSTITUTIONAL:  [ ]  fever   [ ]  chills  PHYSICAL EXAM: Filed Vitals:   09/19/11 1407  BP: 190/90  Pulse: 88  Resp: 18  Height: 5\' 7"  (1.702 m)  Weight: 196 lb 1.6 oz (88.95 kg)   Body mass index is 30.71 kg/(m^2). GENERAL: The patient is a well-nourished female, in no acute distress. The vital signs are documented above. CARDIOVASCULAR: There is a regular rate and rhythm without significant murmur appreciated. I do not detect  carotid bruits. She has palpable brachial and radial pulses bilaterally. PULMONARY: There is good air exchange bilaterally without wheezing or rales. ABDOMEN: Soft and non-tender with normal pitched bowel sounds.  MUSCULOSKELETAL: There are no major deformities or cyanosis. NEUROLOGIC: No focal weakness or paresthesias are detected. SKIN: There are no ulcers or rashes noted. PSYCHIATRIC: The patient has  a normal affect.  DATA:  Lab Results  Component Value Date   WBC 9.3 08/19/2011   HGB 12.2 08/19/2011   HCT 35.1* 08/19/2011   MCV 88.6 08/19/2011   PLT 218 08/19/2011   Lab Results  Component Value Date   NA 140 08/21/2011   K 4.7 08/21/2011   CL 103 08/21/2011   CO2 24 08/21/2011   Lab Results  Component Value Date   CREATININE 4.7* 08/21/2011   Lab Results  Component Value Date   INR 1.09 02/23/2011   Lab Results  Component Value Date   HGBA1C 7.3* 08/21/2011   CBG (last 3)  No results found for this basename: GLUCAP:3 in the last 72 hours  I have independently interpreted her vein mapping which shows that her forearm and upper arm cephalic vein on both sides is quite small and likely not usable for a fistula. The basilic vein on the right is somewhat small but appears to be her largest potential vein to be used for a fistula.  MEDICAL ISSUES:  End stage renal disease Vein mapping suggested the forearm cephalic vein in the upper arm cephalic vein on the right are too small for a fistula. The right arm basilic vein is reasonable in size although somewhat small also. I think her best chance for a fistula is likely a right basilic vein transposition. However I will examine the vein is myself in the operating room before making that decision. I have explained the indications for placement of an AV fistula or AV graft. I've explained that if at all possible we will place an AV fistula.  I have reviewed the risks of placement of an AV fistula including but not limited to: failure of the  fistula to mature, need for subsequent interventions, and thrombosis. In addition I have reviewed the potential complications of placement of an AV graft. These risks include, but are not limited to, graft thrombosis, graft infection, wound healing problems, bleeding, arm swelling, and steal syndrome. All the patient's questions were answered. She is currently not willing to schedule surgery today but will call near future to make arrangements for placement of a fistula in the right arm if at all possible.     Braxston Quinter S [facility] [place]: [phone]

## 2011-09-20 NOTE — Procedures (Unsigned)
CEPHALIC VEIN MAPPING  INDICATION:  Chronic kidney disease.  HISTORY:  EXAM:  The right cephalic vein is compressible.  Diameter measurements range from 0.12 to 0.23 cm.  The right basilic vein is compressible.  Diameter ranges from 0.24 to 0.54 cm.  The left cephalic vein is compressible.  Diameter measurements range from 0.10 to 0.15 cm.  The left basilic vein is compressible.  Diameter measurements range from 0.13 to 0.37 cm/s.  See attached worksheet for all measurements.  IMPRESSION:  Patent bilateral cephalic and basilic veins with diameter measurements as described above.  ___________________________________________ Di Kindle. Edilia Bo, M.D.  SS/MEDQ  D:  09/19/2011  T:  09/19/2011  Job:  [id number]

## 2011-09-25 ENCOUNTER — Other Ambulatory Visit: Payer: Self-pay | Admitting: Internal Medicine

## 2011-09-26 ENCOUNTER — Telehealth: Payer: Self-pay

## 2011-09-26 NOTE — Telephone Encounter (Signed)
Patient has been having on going cramps in her calves. Also would like a referral to a different ENT (other than Dr. Ezzard Standing) to get a second opinion. Patient has appointment with JW[facility] this coming Friday 09/28/11. Please advise call back number is [phone]

## 2011-09-26 NOTE — Telephone Encounter (Signed)
I think ok to wait for appt Friday feb 22

## 2011-09-27 NOTE — Telephone Encounter (Signed)
Patient informed. 

## 2011-09-28 ENCOUNTER — Ambulatory Visit (INDEPENDENT_AMBULATORY_CARE_PROVIDER_SITE_OTHER): Payer: Medicare Other | Admitting: Internal Medicine

## 2011-09-28 ENCOUNTER — Ambulatory Visit (INDEPENDENT_AMBULATORY_CARE_PROVIDER_SITE_OTHER)
Admission: RE | Admit: 2011-09-28 | Discharge: 2011-09-28 | Disposition: A | Discharge status: Home / Self Care | Payer: Medicare Other | Source: Ambulatory Visit | Attending: Internal Medicine | Admitting: Internal Medicine

## 2011-09-28 ENCOUNTER — Encounter: Payer: Self-pay | Admitting: Internal Medicine

## 2011-09-28 VITALS — BP 164/100 | HR 109 | Temp 98.6°F | Ht 67.5 in | Wt 194.0 lb

## 2011-09-28 DIAGNOSIS — J449 Chronic obstructive pulmonary disease, unspecified: Secondary | ICD-10-CM

## 2011-09-28 DIAGNOSIS — J209 Acute bronchitis, unspecified: Secondary | ICD-10-CM

## 2011-09-28 DIAGNOSIS — J309 Allergic rhinitis, unspecified: Secondary | ICD-10-CM

## 2011-09-28 DIAGNOSIS — I10 Essential (primary) hypertension: Secondary | ICD-10-CM

## 2011-09-28 MED ORDER — AZITHROMYCIN 250 MG PO TABS: ORAL_TABLET | ORAL | Status: AC

## 2011-09-28 MED ORDER — HYDROCODONE-HOMATROPINE 5-1.5 MG/5ML PO SYRP: 5.0000 mL | ORAL_SOLUTION | Freq: Four times a day (QID) | ORAL | Status: AC | PRN

## 2011-09-28 MED ORDER — MONTELUKAST SODIUM 10 MG PO TABS: 10.0000 mg | ORAL_TABLET | Freq: Every day | ORAL | Status: DC

## 2011-09-28 NOTE — Patient Instructions (Addendum)
Take all new medications as prescribed - the antibiotic, and cough medicine, and the generic singulair (for allergies and wheezing) OK to stop the levocetirizine (xyzal) Continue all other medications as before You can also take  Mucinex (or it's generic off brand) for congestion Please go to XRAY in the Basement for the x-ray test You had recent blood work in January 2013, so I dont think more is needed at this time Please call the phone number [phone] (the [employer] System) for results of testing in 2-3 days;  When calling, simply dial the number, and when prompted enter the MRN number above (the Medical Record Number) and the # key, then the message should start. [name] [name] (one of our Patient Schedulers) will look into helping you with Patient Assistance applications for Lyrica, Lipitor, and Dulera You will be contacted by "[facility]" which is a no-cost organization to help you with any other medical needs not covered by your insurance

## 2011-09-28 NOTE — Assessment & Plan Note (Addendum)
Mild ongoing, xyzal too expensive - to try change to singulair asd,  to f/u any worsening symptoms or concerns

## 2011-09-29 ENCOUNTER — Encounter: Payer: Self-pay | Admitting: Internal Medicine

## 2011-09-29 NOTE — Progress Notes (Signed)
Subjective:    Patient ID: [name] [name], female    DOB: Jul 29, 1941, 71 y.o.   MRN: [id number]  HPI  Here with acute onset mild to mod 2-3 days ST, HA, general weakness and malaise, with prod cough greenish sputum, but Pt denies chest pain, increased sob or doe, wheezing, orthopnea, PND, increased LE swelling, palpitations, dizziness or syncope.  Pt denies new neurological symptoms such as new headache, or facial or extremity weakness or numbness   Pt denies polydipsia, polyuria, or low sugar symptoms such as weakness or confusion improved with po intake.  Pt states overall good compliance with meds, trying to follow lower cholesterol, diabetic diet, wt overall stable but little exercise however.   Does have several wks ongoing nasal allergy symptoms with clear congestion, itch and sneeze, without fever, pain, ST, cough or wheezing; not taking xyzal as too expensive.   Past Medical History  Diagnosis Date  . HYPOTHYROIDISM 02/17/2007  . DIABETES MELLITUS, TYPE II 02/17/2007  . HYPERLIPIDEMIA 02/17/2007  . GOUT 05/29/2007  . ANEMIA-NOS 05/29/2007  . ANXIETY 03/23/2010  . 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 DEPRESSION 02/17/2007  . RESTLESS LEG SYNDROME 05/29/2007  . COMMON MIGRAINE 05/29/2007  . PERIPHERAL NEUROPATHY 05/29/2007  . HYPERTENSION 02/17/2007  . PERIPHERAL VASCULAR DISEASE 02/17/2007  . ASTHMATIC BRONCHITIS, ACUTE 10/08/2008  . COPD 05/29/2007  . GERD 02/17/2007  . PEPTIC ULCER DISEASE 05/29/2007  . GASTROENTERITIS 12/02/2007  . RENAL INSUFFICIENCY 02/17/2007  . MENOPAUSAL DISORDER 05/25/2009  . Osteoarth NOS-L/Leg 03/11/2008  . SHOULDER PAIN, LEFT, CHRONIC 09/15/2008  . CERVICAL RADICULOPATHY, LEFT 05/27/2008  . BACK PAIN 12/12/2009  . OSTEOPENIA 09/22/2009  . SEIZURE DISORDER 02/17/2007  . HYPERSOMNIA 05/26/2010  . Memory loss 01/24/2010  . Abnormality of gait 07/30/2007  . ABDOMINAL PAIN -GENERALIZED 11/16/2009  . Personal history of colonic polyps 11/16/2009  . PERSISTENT DISORDER  INITIATING/MAINTAINING SLEEP 08/22/2010  . INADEQUATE SLEEP HYGIENE 08/22/2010  . DYSPNEA 08/22/2010  . Wheezing 08/15/2010  . CHEST PAIN 08/15/2010  . CHRONIC OBSTRUCTIVE PULMONARY DISEASE, ACUTE EXACERBATION 09/08/2010  . Palpitations 09/08/2010  . Other diseases of vocal cords 09/19/2010  . HEART MURMUR, HX OF 10/02/2010  . 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 Chronic neck pain 12/13/2010  . Osteopenia 12/13/2010   Past Surgical History  Procedure Date  . Left toe amputated 2006  . Bunionectomy 1980  . Goiter removal 1997  . Stress cardiolite 06/18/2006  . Tranthoracic echocardiogram 06/18/2006  . Electrocardiogram 05/29/2006  . Cholecystectomy   . Rotator cuff repair Left    Dr. Lajoyce Corners    reports that she has been smoking Cigarettes.  She has a 11.75 pack-year smoking history. She uses smokeless tobacco. She reports that she does not drink alcohol or use illicit drugs. family history includes Coronary artery disease in her other; Dementia in her mother; Hyperlipidemia in her other; Hypertension in her other; Ovarian cancer in her other; and Stroke in her other. Allergies  Allergen Reactions  . Nsaids     REACTION: renal dysfunction  . Pioglitazone     Actos REACTION: EDEMA   Current Outpatient Prescriptions on File Prior to Visit  Medication Sig Dispense Refill  . albuterol (PROVENTIL HFA;VENTOLIN HFA) 108 (90 BASE) MCG/ACT inhaler Inhale 1 puff into the lungs every 4 (four) hours as needed for wheezing or shortness of breath.  1 Inhaler  0  . atorvastatin (LIPITOR) 40 MG tablet Take 1 tablet (40 mg total) by mouth daily.  30 tablet  11  . clonazePAM (KLONOPIN)  2 MG tablet Take 1 tablet (2 mg total) by mouth 2 (two) times daily as needed for anxiety.  60 tablet  5  . cyclobenzaprine (FLEXERIL) 5 MG tablet Take 5 mg by mouth 3 (three) times daily as needed. For muscle spasms      . DULoxetine (CYMBALTA) 60 MG capsule Take 60 mg by mouth daily.      . ferrous sulfate 325 (65 FE) MG tablet 1 tab by  mouth twice per day  30 tablet  11  . fluticasone (FLONASE) 50 MCG/ACT nasal spray Place 2 sprays into the nose daily.        . furosemide (LASIX) 80 MG tablet Take 1 tablet (80 mg total) by mouth 2 (two) times daily.      Marland Kitchen glimepiride (AMARYL) 1 MG tablet Take 1 mg by mouth every morning before breakfast. 1 po every day       . HYDROCODONE-ACETAMINOPHEN PO Take 7.5 mg by mouth daily.      . hydrOXYzine (ATARAX/VISTARIL) 25 MG tablet Take 25 mg by mouth as needed.      . mometasone-formoterol (DULERA) 100-5 MCG/ACT AERO Inhale 2 puffs into the lungs 2 (two) times daily.  1 Inhaler  6  . omeprazole (PRILOSEC) 40 MG capsule Take 40 mg by mouth daily.        . pregabalin (LYRICA) 50 MG capsule Take 100 mg by mouth daily.      . promethazine (PHENERGAN) 25 MG tablet Take 25 mg by mouth as needed.      . traZODone (DESYREL) 100 MG tablet Take 0.5 tablets (50 mg total) by mouth at bedtime as needed. For sleep      . levothyroxine (SYNTHROID, LEVOTHROID) 125 MCG tablet TAKE ONE TABLET BY MOUTH EVERY DAY  90 tablet  3  . tiotropium (SPIRIVA) 18 MCG inhalation capsule Place 18 mcg into inhaler and inhale daily.         Review of Systems Review of Systems  Constitutional: Negative for diaphoresis and unexpected weight change.  HENT: Negative for drooling and tinnitus.   Eyes: Negative for photophobia and visual disturbance.  Respiratory: Negative for choking and stridor.   Gastrointestinal: Negative for vomiting and blood in stool.  Genitourinary: Negative for hematuria and decreased urine volume.     Objecti   Physical Exam BP 164/100  Pulse 109  Temp(Src) 98.6 F (37 C) (Oral)  Ht 5' 7.5" (1.715 m)  Wt 194 lb (87.998 kg)  BMI 29.94 kg/m2  SpO2 98% Physical Exam  VS noted, mild ill Constitutional: Pt appears well-developed and well-nourished.  HENT: Head: Normocephalic.  Right Ear: External ear normal.  Left Ear: External ear normal.  Bilat tm's mild erythema.  Sinus nontender.  Pharynx  mild erythema Eyes: Conjunctivae and EOM are normal. Pupils are equal, round, and reactive to light.  Neck: Normal range of motion. Neck supple.  Cardiovascular: Normal rate and regular rhythm.   Pulmonary/Chest: Effort normal and breath sounds normal.  Neurological: Pt is alert. No cranial nerve deficit.  Skin: Skin is warm. No erythema.  Psychiatric: Pt behavior is normal. Thought content normal. 1+ nervous    Assessment & Plan:

## 2011-09-29 NOTE — Assessment & Plan Note (Signed)
Mild elev liekly reactive,  Continue all other medications as before, stable overall by hx and exam, most recent data reviewed with pt, and pt to continue medical treatment as before BP Readings from Last 3 Encounters:  09/28/11 164/100  09/19/11 190/90  08/21/11 140/92

## 2011-09-29 NOTE — Assessment & Plan Note (Addendum)
Mild to mod, for antibx course,  to f/u any worsening symptoms or concerns, cant ro pna - for cxr 

## 2011-09-29 NOTE — Assessment & Plan Note (Signed)
stable overall by hx and exam, most recent data reviewed with pt, and pt to continue medical treatment as before  SpO2 Readings from Last 3 Encounters:  09/28/11 98%  08/21/11 98%  08/19/11 97%

## 2011-10-03 ENCOUNTER — Telehealth: Payer: Self-pay

## 2011-10-03 MED ORDER — AMLODIPINE BESYLATE 5 MG PO TABS: 5.0000 mg | ORAL_TABLET | Freq: Every day | ORAL | Status: DC

## 2011-10-03 NOTE — Telephone Encounter (Signed)
The patient called to inform at her last OV she thought a BP medication was going to be prescribed, but stated she got something sent in for asthma, please advise as is concerned about her BP.

## 2011-10-03 NOTE — Telephone Encounter (Signed)
Patient informed. 

## 2011-10-03 NOTE — Telephone Encounter (Signed)
OK to start amlodipine 5 mg per day, as this will help BP but not affect the kidneys - done per emr

## 2011-10-03 NOTE — Telephone Encounter (Signed)
As indicated on the AVS, the singulair is for allergies instead of the xyzal which she said was expensive

## 2011-10-03 NOTE — Telephone Encounter (Signed)
Called could not leave message and no answer

## 2011-10-03 NOTE — Telephone Encounter (Signed)
onformed BP sent in. The patient would like to know why a prescription for asthma was sent in as she stated she does not have asthma?

## 2011-10-05 ENCOUNTER — Other Ambulatory Visit: Payer: Self-pay

## 2011-10-05 NOTE — Telephone Encounter (Signed)
Too soon

## 2011-10-08 NOTE — Telephone Encounter (Signed)
Patient informed refill too soon.

## 2011-11-06 ENCOUNTER — Telehealth: Payer: Self-pay

## 2011-11-06 MED ORDER — PREGABALIN 50 MG PO CAPS: 100.0000 mg | ORAL_CAPSULE | Freq: Every evening | ORAL | Status: DC | PRN

## 2011-11-06 NOTE — Telephone Encounter (Signed)
Samples normally done at OV only  [facility] for rx - done per [employer]

## 2011-11-06 NOTE — Telephone Encounter (Signed)
Faxed hardcopy to pharmacy and called informed the patient.

## 2011-11-06 NOTE — Telephone Encounter (Signed)
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

## 2011-11-08 ENCOUNTER — Telehealth: Payer: Self-pay

## 2011-11-08 NOTE — Telephone Encounter (Signed)
The patient called to inform her lyrica has not been filled as needs a PA.  Is it ok to give the pateint samples, please advise

## 2011-11-08 NOTE — Telephone Encounter (Signed)
Called patient informed did received PA for Lyrica and would complete asap. Also informed no samples without OV. Patient understood all information.

## 2011-11-08 NOTE — Telephone Encounter (Signed)
Sorry, Animator is samples with OV only as we cannot consistently supply meds otherwise

## 2011-11-19 ENCOUNTER — Telehealth: Payer: Self-pay

## 2011-11-19 NOTE — Telephone Encounter (Signed)
Received PA for cyclobenzaprine please advise to proceed with PA or offer alternative

## 2011-11-19 NOTE — Telephone Encounter (Signed)
I believe we have been "down this road" where, if memory serves, none of the muscle relaxers are covered by her drug plan  Please let me know if this is not true, as we can be flexible most likely with any similar med her plan covers, or she may be simply be responsible for the cost of the med

## 2011-11-20 NOTE — Telephone Encounter (Signed)
Called informed the patient that she can get cyclobenazepril #90 for $10 or #30 for $4 at [facility].  The patient agreed to do so.

## 2011-12-11 ENCOUNTER — Other Ambulatory Visit: Payer: Self-pay

## 2011-12-11 NOTE — Telephone Encounter (Signed)
Called the patient informed of MD's response on request.  The patient would like to know if there is anything else to prescribe that would be affordable as states she is having a lot of shoulder pain.

## 2011-12-11 NOTE — Telephone Encounter (Signed)
The only alternative is to try to decresae the vicodin to 5/325 prn , as I think this is not too expensive, as other patients do not c/o the cost  Let me know if this is ok

## 2011-12-11 NOTE — Telephone Encounter (Signed)
Very sorry, but the 750 is very high tylenol, and would not be approp treatment for her

## 2011-12-11 NOTE — Telephone Encounter (Signed)
The patient called to inform her hydrocodone 7/325 insurance will not pay and is costing her $33.  She informed they will pay for hydrocodone 7.5/750 please advise

## 2011-12-12 MED ORDER — HYDROCODONE-ACETAMINOPHEN 5-325 MG PO TABS: 1.0000 | ORAL_TABLET | Freq: Four times a day (QID) | ORAL | Status: AC | PRN

## 2011-12-12 NOTE — Telephone Encounter (Signed)
Faxed hardcopy to pharmacy. 

## 2011-12-12 NOTE — Telephone Encounter (Signed)
Called the patient and the vicodin 5/325 is ok to try

## 2011-12-12 NOTE — Telephone Encounter (Signed)
Done hardcopy to robin  

## 2011-12-12 NOTE — Telephone Encounter (Signed)
Called the patient left message to call back 

## 2011-12-17 ENCOUNTER — Other Ambulatory Visit: Payer: Self-pay

## 2011-12-17 MED ORDER — OMEPRAZOLE 40 MG PO CPDR: 40.0000 mg | DELAYED_RELEASE_CAPSULE | Freq: Every day | ORAL | Status: DC

## 2011-12-30 ENCOUNTER — Other Ambulatory Visit: Payer: Self-pay | Admitting: Internal Medicine

## 2012-01-01 NOTE — Telephone Encounter (Signed)
Done per emr 

## 2012-01-24 ENCOUNTER — Ambulatory Visit (INDEPENDENT_AMBULATORY_CARE_PROVIDER_SITE_OTHER): Payer: Medicare Other | Admitting: Internal Medicine

## 2012-01-24 ENCOUNTER — Other Ambulatory Visit (INDEPENDENT_AMBULATORY_CARE_PROVIDER_SITE_OTHER): Payer: Medicare Other

## 2012-01-24 ENCOUNTER — Encounter: Payer: Self-pay | Admitting: Internal Medicine

## 2012-01-24 ENCOUNTER — Other Ambulatory Visit: Payer: Self-pay | Admitting: Internal Medicine

## 2012-01-24 VITALS — BP 120/70 | HR 91 | Temp 97.7°F | Ht 67.0 in | Wt 176.0 lb

## 2012-01-24 DIAGNOSIS — E119 Type 2 diabetes mellitus without complications: Secondary | ICD-10-CM

## 2012-01-24 DIAGNOSIS — R112 Nausea with vomiting, unspecified: Secondary | ICD-10-CM

## 2012-01-24 DIAGNOSIS — I10 Essential (primary) hypertension: Secondary | ICD-10-CM

## 2012-01-24 DIAGNOSIS — R300 Dysuria: Secondary | ICD-10-CM

## 2012-01-24 LAB — URINALYSIS, ROUTINE W REFLEX MICROSCOPIC
Ketones, ur: NEGATIVE
Specific Gravity, Urine: 1.02 (ref 1.000–1.030)
Urine Glucose: NEGATIVE
Urobilinogen, UA: 0.2 (ref 0.0–1.0)

## 2012-01-24 MED ORDER — CEPHALEXIN 500 MG PO CAPS: 500.0000 mg | ORAL_CAPSULE | Freq: Four times a day (QID) | ORAL | Status: AC

## 2012-01-24 MED ORDER — PREGABALIN 50 MG PO CAPS: 100.0000 mg | ORAL_CAPSULE | Freq: Every evening | ORAL | Status: DC | PRN

## 2012-01-24 NOTE — Assessment & Plan Note (Signed)
stable overall by hx and exam, most recent data reviewed with pt, and pt to continue medical treatment as before BP Readings from Last 3 Encounters:  01/24/12 120/70  09/28/11 164/100  09/19/11 190/90

## 2012-01-24 NOTE — Assessment & Plan Note (Signed)
stable overall by hx and exam, most recent data reviewed with pt, and pt to continue medical treatment as before Lab Results  Component Value Date   HGBA1C 7.3* 08/21/2011

## 2012-01-24 NOTE — Assessment & Plan Note (Signed)
?   Etiology, ? Clinical significant , exam benign, no recurrence since lsast wk, to f/u any worsening symptoms or concerns

## 2012-01-24 NOTE — Progress Notes (Signed)
Subjective:    Patient ID: [name] [name], female    DOB: Jan 06, 1941, 71 y.o.   MRN: [id number]  HPI  Here to f/u; just saw renal/dr powell yesterday with cr stable but labs; pt states mentioned had an episode of n/v and asked to f/u here for that, as she is doing now;  Has one episode n/v while sitting on commode approx 1 wk ago wihtout recurrence, no abd pain, change in bowels  And Denies worsening reflux, dysphagia, abd pain,  bowel change or blood.  No fever, but has had mild dysuria for several days, not clear if related.  Pt denies chest pain, increased sob or doe, wheezing, orthopnea, PND, increased LE swelling, palpitations, dizziness or syncope.Pt denies new neurological symptoms such as new headache, or facial or extremity weakness or numbness   Pt denies polydipsia, polyuria Past Medical History  Diagnosis Date  . HYPOTHYROIDISM 02/17/2007  . DIABETES MELLITUS, TYPE II 02/17/2007  . HYPERLIPIDEMIA 02/17/2007  . GOUT 05/29/2007  . ANEMIA-NOS 05/29/2007  . ANXIETY 03/23/2010  . 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 DEPRESSION 02/17/2007  . RESTLESS LEG SYNDROME 05/29/2007  . COMMON MIGRAINE 05/29/2007  . PERIPHERAL NEUROPATHY 05/29/2007  . HYPERTENSION 02/17/2007  . PERIPHERAL VASCULAR DISEASE 02/17/2007  . ASTHMATIC BRONCHITIS, ACUTE 10/08/2008  . COPD 05/29/2007  . GERD 02/17/2007  . PEPTIC ULCER DISEASE 05/29/2007  . GASTROENTERITIS 12/02/2007  . RENAL INSUFFICIENCY 02/17/2007  . MENOPAUSAL DISORDER 05/25/2009  . Osteoarth NOS-L/Leg 03/11/2008  . SHOULDER PAIN, LEFT, CHRONIC 09/15/2008  . CERVICAL RADICULOPATHY, LEFT 05/27/2008  . BACK PAIN 12/12/2009  . OSTEOPENIA 09/22/2009  . SEIZURE DISORDER 02/17/2007  . HYPERSOMNIA 05/26/2010  . Memory loss 01/24/2010  . Abnormality of gait 07/30/2007  . ABDOMINAL PAIN -GENERALIZED 11/16/2009  . Personal history of colonic polyps 11/16/2009  . PERSISTENT DISORDER INITIATING/MAINTAINING SLEEP 08/22/2010  . INADEQUATE SLEEP HYGIENE 08/22/2010  . DYSPNEA  08/22/2010  . Wheezing 08/15/2010  . CHEST PAIN 08/15/2010  . CHRONIC OBSTRUCTIVE PULMONARY DISEASE, ACUTE EXACERBATION 09/08/2010  . Palpitations 09/08/2010  . Other diseases of vocal cords 09/19/2010  . HEART MURMUR, HX OF 10/02/2010  . 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 Chronic neck pain 12/13/2010  . Osteopenia 12/13/2010   Past Surgical History  Procedure Date  . Left toe amputated 2006  . Bunionectomy 1980  . Goiter removal 1997  . Stress cardiolite 06/18/2006  . Tranthoracic echocardiogram 06/18/2006  . Electrocardiogram 05/29/2006  . Cholecystectomy   . Rotator cuff repair Left    Dr. Lajoyce Corners    reports that she has been smoking Cigarettes.  She has a 11.75 pack-year smoking history. She uses smokeless tobacco. She reports that she does not drink alcohol or use illicit drugs. family history includes Coronary artery disease in her other; Dementia in her mother; Hyperlipidemia in her other; Hypertension in her other; Ovarian cancer in her other; and Stroke in her other. Allergies  Allergen Reactions  . Nsaids     REACTION: renal dysfunction  . Pioglitazone     Actos REACTION: EDEMA   Current Outpatient Prescriptions on File Prior to Visit  Medication Sig Dispense Refill  . albuterol (PROVENTIL HFA;VENTOLIN HFA) 108 (90 BASE) MCG/ACT inhaler Inhale 1 puff into the lungs every 4 (four) hours as needed for wheezing or shortness of breath.  1 Inhaler  0  . amLODipine (NORVASC) 5 MG tablet Take 1 tablet (5 mg total) by mouth daily.  90 tablet  3  . atorvastatin (LIPITOR) 40 MG tablet Take 1 tablet (  40 mg total) by mouth daily.  30 tablet  11  . clonazePAM (KLONOPIN) 2 MG tablet Take 1 tablet (2 mg total) by mouth 2 (two) times daily as needed for anxiety.  60 tablet  5  . cyclobenzaprine (FLEXERIL) 5 MG tablet Take 5 mg by mouth 3 (three) times daily as needed. For muscle spasms      . ferrous sulfate 325 (65 FE) MG tablet 1 tab by mouth twice per day  30 tablet  11  . fluticasone (FLONASE) 50  MCG/ACT nasal spray Place 2 sprays into the nose daily.        . furosemide (LASIX) 80 MG tablet Take 1 tablet (80 mg total) by mouth 2 (two) times daily.      Marland Kitchen glimepiride (AMARYL) 1 MG tablet Take 1 mg by mouth every morning before breakfast. 1 po every day       . hydrOXYzine (ATARAX/VISTARIL) 25 MG tablet Take 25 mg by mouth as needed.      Marland Kitchen levothyroxine (SYNTHROID, LEVOTHROID) 125 MCG tablet TAKE ONE TABLET BY MOUTH EVERY DAY  90 tablet  3  . mometasone-formoterol (DULERA) 100-5 MCG/ACT AERO Inhale 2 puffs into the lungs 2 (two) times daily.  1 Inhaler  6  . montelukast (SINGULAIR) 10 MG tablet Take 1 tablet (10 mg total) by mouth daily.  30 tablet  11  . omeprazole (PRILOSEC) 40 MG capsule Take 1 capsule (40 mg total) by mouth daily.  30 capsule  11  . promethazine (PHENERGAN) 25 MG tablet Take 25 mg by mouth as needed.      . tiotropium (SPIRIVA) 18 MCG inhalation capsule Place 18 mcg into inhaler and inhale daily.        . traZODone (DESYREL) 100 MG tablet Take 0.5 tablets (50 mg total) by mouth at bedtime as needed. For sleep      . traZODone (DESYREL) 100 MG tablet TAKE ONE TABLET BY MOUTH AT BEDTIME AS NEEDED  90 tablet  1  . DISCONTD: pregabalin (LYRICA) 50 MG capsule Take 2 capsules (100 mg total) by mouth at bedtime as needed.  180 capsule  1  . DULoxetine (CYMBALTA) 60 MG capsule Take 60 mg by mouth daily.      . traZODone (DESYREL) 100 MG tablet        Review of Systems Review of Systems  Constitutional: Negative for diaphoresis and unexpected weight change.  Eyes: Negative for photophobia and visual disturbance.  Respiratory: Negative for choking and stridor.   Genitourinary: Negative for hematuria and decreased urine volume.  Musculoskeletal: Negative for gait problem.  Skin: Negative for color change and wound.  Neurological: Negative for tremors and numbness.  Psychiatric/Behavioral: Negative for decreased concentration. The patient is not hyperactive.        Objective:   Physical Exam BP 120/70  Pulse 91  Temp 97.7 F (36.5 C) (Oral)  Ht 5\' 7"  (1.702 m)  Wt 176 lb (79.833 kg)  BMI 27.57 kg/m2  SpO2 98% Physical Exam  VS noted Constitutional: Pt appears well-developed and well-nourished.  HENT: Head: Normocephalic.  Right Ear: External ear normal.  Left Ear: External ear normal.  Eyes: Conjunctivae and EOM are normal. Pupils are equal, round, and reactive to light.  Neck: Normal range of motion. Neck supple.  Cardiovascular: Normal rate and regular rhythm.   Pulmonary/Chest: Effort normal and breath sounds normal.  Abd:  Soft, NT, non-distended, + BS Neurological: Pt is alert. No cranial nerve deficit.  Skin: Skin is  warm. No erythema.  Psychiatric: Pt behavior is normal. 1+ nervous    Assessment & Plan:

## 2012-01-24 NOTE — Patient Instructions (Addendum)
You are given the sample for ventolin HFA and Dulera today Continue all other medications as before We will try to get the labs from Dr Roanna Banning office from yesterday Please go to LAB in the Basement for the urine tests to be done today You will be contacted by phone if any changes need to be made immediately.  Otherwise, you will receive a letter about your results with an explanation. Please return if the nausea and vomiting seem worse, but your exam today is ok Please see [name] [name] regarding the Patient Assistance program for the [employer]

## 2012-01-24 NOTE — Assessment & Plan Note (Signed)
?   uti - for UA, suspect may have had something to do with n/v, tx as indicated,  to f/u any worsening symptoms or concerns

## 2012-01-29 ENCOUNTER — Telehealth: Payer: Self-pay

## 2012-01-29 NOTE — Telephone Encounter (Signed)
Called the patient informed form Proofreader) has been completed for patient assistance for [employer].  The patient should pickup form, complete all personal information as well as sending in income information along with the form. Informed the patient she could mail completed form to address on form and we would call once we hear response.

## 2012-02-08 ENCOUNTER — Ambulatory Visit: Payer: Medicare Other | Admitting: Internal Medicine

## 2012-02-16 ENCOUNTER — Other Ambulatory Visit: Payer: Self-pay | Admitting: Internal Medicine

## 2012-02-19 ENCOUNTER — Ambulatory Visit: Payer: Medicare Other | Admitting: Pulmonary Disease

## 2012-02-21 ENCOUNTER — Ambulatory Visit: Payer: Medicare Other | Admitting: Pulmonary Disease

## 2012-03-03 ENCOUNTER — Other Ambulatory Visit: Payer: Self-pay | Admitting: Internal Medicine

## 2012-03-03 NOTE — Telephone Encounter (Signed)
Done hardcopy to robin  

## 2012-03-03 NOTE — Telephone Encounter (Signed)
Faxed hardcopy to pharmacy. 

## 2012-03-08 ENCOUNTER — Emergency Department (HOSPITAL_COMMUNITY): Payer: Medicare Other

## 2012-03-08 ENCOUNTER — Other Ambulatory Visit: Payer: Self-pay | Admitting: Internal Medicine

## 2012-03-08 ENCOUNTER — Inpatient Hospital Stay (HOSPITAL_COMMUNITY)
Admission: EM | Admit: 2012-03-08 | Discharge: 2012-03-14 | DRG: 674 | Disposition: A | Discharge status: Skilled Nursing Facility | Payer: Medicare Other | Attending: Internal Medicine | Admitting: Internal Medicine

## 2012-03-08 ENCOUNTER — Encounter (HOSPITAL_COMMUNITY): Payer: Self-pay

## 2012-03-08 DIAGNOSIS — M1710 Unilateral primary osteoarthritis, unspecified knee: Secondary | ICD-10-CM

## 2012-03-08 DIAGNOSIS — N390 Urinary tract infection, site not specified: Secondary | ICD-10-CM

## 2012-03-08 DIAGNOSIS — M109 Gout, unspecified: Secondary | ICD-10-CM

## 2012-03-08 DIAGNOSIS — R2681 Unsteadiness on feet: Secondary | ICD-10-CM

## 2012-03-08 DIAGNOSIS — M5430 Sciatica, unspecified side: Secondary | ICD-10-CM

## 2012-03-08 DIAGNOSIS — Z0000 Encounter for general adult medical examination without abnormal findings: Secondary | ICD-10-CM

## 2012-03-08 DIAGNOSIS — J383 Other diseases of vocal cords: Secondary | ICD-10-CM

## 2012-03-08 DIAGNOSIS — IMO0002 Reserved for concepts with insufficient information to code with codable children: Secondary | ICD-10-CM

## 2012-03-08 DIAGNOSIS — R300 Dysuria: Secondary | ICD-10-CM

## 2012-03-08 DIAGNOSIS — R413 Other amnesia: Secondary | ICD-10-CM

## 2012-03-08 DIAGNOSIS — R531 Weakness: Secondary | ICD-10-CM

## 2012-03-08 DIAGNOSIS — D649 Anemia, unspecified: Secondary | ICD-10-CM

## 2012-03-08 DIAGNOSIS — M8580 Other specified disorders of bone density and structure, unspecified site: Secondary | ICD-10-CM

## 2012-03-08 DIAGNOSIS — J209 Acute bronchitis, unspecified: Secondary | ICD-10-CM

## 2012-03-08 DIAGNOSIS — J4489 Other specified chronic obstructive pulmonary disease: Secondary | ICD-10-CM

## 2012-03-08 DIAGNOSIS — N959 Unspecified menopausal and perimenopausal disorder: Secondary | ICD-10-CM

## 2012-03-08 DIAGNOSIS — Z9181 History of falling: Secondary | ICD-10-CM

## 2012-03-08 DIAGNOSIS — IMO0001 Reserved for inherently not codable concepts without codable children: Secondary | ICD-10-CM

## 2012-03-08 DIAGNOSIS — N19 Unspecified kidney failure: Secondary | ICD-10-CM

## 2012-03-08 DIAGNOSIS — M899 Disorder of bone, unspecified: Secondary | ICD-10-CM | POA: Diagnosis present

## 2012-03-08 DIAGNOSIS — R1084 Generalized abdominal pain: Secondary | ICD-10-CM

## 2012-03-08 DIAGNOSIS — E039 Hypothyroidism, unspecified: Secondary | ICD-10-CM

## 2012-03-08 DIAGNOSIS — F17200 Nicotine dependence, unspecified, uncomplicated: Secondary | ICD-10-CM

## 2012-03-08 DIAGNOSIS — N2889 Other specified disorders of kidney and ureter: Secondary | ICD-10-CM

## 2012-03-08 DIAGNOSIS — J449 Chronic obstructive pulmonary disease, unspecified: Secondary | ICD-10-CM | POA: Diagnosis present

## 2012-03-08 DIAGNOSIS — R5383 Other fatigue: Secondary | ICD-10-CM

## 2012-03-08 DIAGNOSIS — G4700 Insomnia, unspecified: Secondary | ICD-10-CM

## 2012-03-08 DIAGNOSIS — G4710 Hypersomnia, unspecified: Secondary | ICD-10-CM

## 2012-03-08 DIAGNOSIS — K219 Gastro-esophageal reflux disease without esophagitis: Secondary | ICD-10-CM

## 2012-03-08 DIAGNOSIS — R7302 Impaired glucose tolerance (oral): Secondary | ICD-10-CM | POA: Diagnosis present

## 2012-03-08 DIAGNOSIS — G43009 Migraine without aura, not intractable, without status migrainosus: Secondary | ICD-10-CM

## 2012-03-08 DIAGNOSIS — Z8679 Personal history of other diseases of the circulatory system: Secondary | ICD-10-CM

## 2012-03-08 DIAGNOSIS — N289 Disorder of kidney and ureter, unspecified: Secondary | ICD-10-CM | POA: Diagnosis present

## 2012-03-08 DIAGNOSIS — E119 Type 2 diabetes mellitus without complications: Secondary | ICD-10-CM

## 2012-03-08 DIAGNOSIS — N259 Disorder resulting from impaired renal tubular function, unspecified: Secondary | ICD-10-CM

## 2012-03-08 DIAGNOSIS — N189 Chronic kidney disease, unspecified: Secondary | ICD-10-CM

## 2012-03-08 DIAGNOSIS — M549 Dorsalgia, unspecified: Secondary | ICD-10-CM

## 2012-03-08 DIAGNOSIS — F329 Major depressive disorder, single episode, unspecified: Secondary | ICD-10-CM | POA: Diagnosis present

## 2012-03-08 DIAGNOSIS — F518 Other sleep disorders not due to a substance or known physiological condition: Secondary | ICD-10-CM

## 2012-03-08 DIAGNOSIS — E785 Hyperlipidemia, unspecified: Secondary | ICD-10-CM

## 2012-03-08 DIAGNOSIS — Z860100 Personal history of colon polyps, unspecified: Secondary | ICD-10-CM

## 2012-03-08 DIAGNOSIS — G8929 Other chronic pain: Secondary | ICD-10-CM

## 2012-03-08 DIAGNOSIS — N186 End stage renal disease: Secondary | ICD-10-CM

## 2012-03-08 DIAGNOSIS — M25519 Pain in unspecified shoulder: Secondary | ICD-10-CM

## 2012-03-08 DIAGNOSIS — M5412 Radiculopathy, cervical region: Secondary | ICD-10-CM

## 2012-03-08 DIAGNOSIS — G2581 Restless legs syndrome: Secondary | ICD-10-CM

## 2012-03-08 DIAGNOSIS — N185 Chronic kidney disease, stage 5: Secondary | ICD-10-CM | POA: Diagnosis present

## 2012-03-08 DIAGNOSIS — F3289 Other specified depressive episodes: Secondary | ICD-10-CM

## 2012-03-08 DIAGNOSIS — M949 Disorder of cartilage, unspecified: Secondary | ICD-10-CM | POA: Diagnosis present

## 2012-03-08 DIAGNOSIS — R269 Unspecified abnormalities of gait and mobility: Secondary | ICD-10-CM

## 2012-03-08 DIAGNOSIS — I120 Hypertensive chronic kidney disease with stage 5 chronic kidney disease or end stage renal disease: Secondary | ICD-10-CM | POA: Diagnosis present

## 2012-03-08 DIAGNOSIS — M25561 Pain in right knee: Secondary | ICD-10-CM

## 2012-03-08 DIAGNOSIS — F411 Generalized anxiety disorder: Secondary | ICD-10-CM

## 2012-03-08 DIAGNOSIS — R569 Unspecified convulsions: Secondary | ICD-10-CM

## 2012-03-08 DIAGNOSIS — R5381 Other malaise: Secondary | ICD-10-CM

## 2012-03-08 DIAGNOSIS — M25562 Pain in left knee: Secondary | ICD-10-CM

## 2012-03-08 DIAGNOSIS — K279 Peptic ulcer, site unspecified, unspecified as acute or chronic, without hemorrhage or perforation: Secondary | ICD-10-CM

## 2012-03-08 DIAGNOSIS — N179 Acute kidney failure, unspecified: Principal | ICD-10-CM | POA: Diagnosis present

## 2012-03-08 DIAGNOSIS — G609 Hereditary and idiopathic neuropathy, unspecified: Secondary | ICD-10-CM

## 2012-03-08 DIAGNOSIS — I10 Essential (primary) hypertension: Secondary | ICD-10-CM

## 2012-03-08 DIAGNOSIS — R002 Palpitations: Secondary | ICD-10-CM

## 2012-03-08 DIAGNOSIS — I739 Peripheral vascular disease, unspecified: Secondary | ICD-10-CM

## 2012-03-08 DIAGNOSIS — J309 Allergic rhinitis, unspecified: Secondary | ICD-10-CM

## 2012-03-08 DIAGNOSIS — R112 Nausea with vomiting, unspecified: Secondary | ICD-10-CM

## 2012-03-08 LAB — CBC
HCT: 31.1 % — ABNORMAL LOW (ref 36.0–46.0)
Hemoglobin: 10.5 g/dL — ABNORMAL LOW (ref 12.0–15.0)
MCH: 31 pg (ref 26.0–34.0)
MCHC: 33.8 g/dL (ref 30.0–36.0)
MCV: 91.7 fL (ref 78.0–100.0)
RDW: 15.8 % — ABNORMAL HIGH (ref 11.5–15.5)

## 2012-03-08 LAB — POCT I-STAT, CHEM 8
BUN: 63 mg/dL — ABNORMAL HIGH (ref 6–23)
Calcium, Ion: 1.1 mmol/L — ABNORMAL LOW (ref 1.13–1.30)
Chloride: 107 mEq/L (ref 96–112)
Creatinine, Ser: 5.4 mg/dL — ABNORMAL HIGH (ref 0.50–1.10)
TCO2: 24 mmol/L (ref 0–100)

## 2012-03-08 LAB — CK: Total CK: 84 U/L (ref 7–177)

## 2012-03-08 LAB — POCT I-STAT TROPONIN I: Troponin i, poc: 0.01 ng/mL (ref 0.00–0.08)

## 2012-03-08 LAB — URINALYSIS, ROUTINE W REFLEX MICROSCOPIC
Bilirubin Urine: NEGATIVE
Glucose, UA: NEGATIVE mg/dL
Protein, ur: 100 mg/dL — AB
Urobilinogen, UA: 0.2 mg/dL (ref 0.0–1.0)

## 2012-03-08 LAB — URINE MICROSCOPIC-ADD ON

## 2012-03-08 MED ORDER — SODIUM CHLORIDE 0.9 % IV BOLUS (SEPSIS)
1000.0000 mL | Freq: Once | INTRAVENOUS | Status: AC
  Administered 2012-03-08: 1000 mL via INTRAVENOUS

## 2012-03-08 NOTE — ED Notes (Signed)
Pt. From home. Reports that she has had weakness in her knees recently which was the cause of her fall today. Now c/o of back pain which is new.

## 2012-03-08 NOTE — ED Provider Notes (Signed)
History     CSN: [id number]  Arrival date & time 03/08/12  1820   First MD Initiated Contact with Patient 03/08/12 1942      Chief Complaint  Patient presents with  . Fall    (Consider location/radiation/quality/duration/timing/severity/associated sxs/prior treatment) HPI Complains of multiple falls over the past 2-3 weeks and weak in both legs. Fell today we'll using her walker. She lay on the floor for house for approximately 45 minutes. She complains of pain in her head since a fall today complains of bilateral shoulder pain for several weeks. Weakness has been progressively worse over the past 3 weeks. No fever no chest pain no shortness of breath no other complaint no treatment prior to coming here. Nothing makes symptoms better or worse Past Medical History  Diagnosis Date  . HYPOTHYROIDISM 02/17/2007  . DIABETES MELLITUS, TYPE II 02/17/2007  . HYPERLIPIDEMIA 02/17/2007  . GOUT 05/29/2007  . ANEMIA-NOS 05/29/2007  . ANXIETY 03/23/2010  . 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 DEPRESSION 02/17/2007  . RESTLESS LEG SYNDROME 05/29/2007  . COMMON MIGRAINE 05/29/2007  . PERIPHERAL NEUROPATHY 05/29/2007  . HYPERTENSION 02/17/2007  . PERIPHERAL VASCULAR DISEASE 02/17/2007  . ASTHMATIC BRONCHITIS, ACUTE 10/08/2008  . COPD 05/29/2007  . GERD 02/17/2007  . PEPTIC ULCER DISEASE 05/29/2007  . GASTROENTERITIS 12/02/2007  . RENAL INSUFFICIENCY 02/17/2007  . MENOPAUSAL DISORDER 05/25/2009  . Osteoarth NOS-L/Leg 03/11/2008  . SHOULDER PAIN, LEFT, CHRONIC 09/15/2008  . CERVICAL RADICULOPATHY, LEFT 05/27/2008  . BACK PAIN 12/12/2009  . OSTEOPENIA 09/22/2009  . SEIZURE DISORDER 02/17/2007  . HYPERSOMNIA 05/26/2010  . Memory loss 01/24/2010  . Abnormality of gait 07/30/2007  . ABDOMINAL PAIN -GENERALIZED 11/16/2009  . Personal history of colonic polyps 11/16/2009  . PERSISTENT DISORDER INITIATING/MAINTAINING SLEEP 08/22/2010  . INADEQUATE SLEEP HYGIENE 08/22/2010  . DYSPNEA 08/22/2010  . Wheezing 08/15/2010  .  CHEST PAIN 08/15/2010  . CHRONIC OBSTRUCTIVE PULMONARY DISEASE, ACUTE EXACERBATION 09/08/2010  . Palpitations 09/08/2010  . Other diseases of vocal cords 09/19/2010  . HEART MURMUR, HX OF 10/02/2010  . 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 Chronic neck pain 12/13/2010  . Osteopenia 12/13/2010    Past Surgical History  Procedure Date  . Left toe amputated 2006  . Bunionectomy 1980  . Goiter removal 1997  . Stress cardiolite 06/18/2006  . Tranthoracic echocardiogram 06/18/2006  . Electrocardiogram 05/29/2006  . Cholecystectomy   . Rotator cuff repair Left    Dr. Lajoyce Corners    Family History  Problem Relation Age of Onset  . Dementia Mother   . Coronary artery disease Other   . Hyperlipidemia Other   . Hypertension Other   . Ovarian cancer Other   . Stroke Other     History  Substance Use Topics  . Smoking status: Current Some Day Smoker -- 0.2 packs/day for 47 years    Types: Cigarettes  . Smokeless tobacco: Current User   Comment:   currently smoking 4 to 5 cigs daily.  . Alcohol Use: No    OB History    Grav Para Term Preterm Abortions TAB SAB Ect Mult Living                  Review of Systems  Respiratory: Negative.   Cardiovascular: Negative.   Gastrointestinal: Negative.   Musculoskeletal: Positive for arthralgias.  Skin: Negative.   Neurological: Positive for weakness and headaches.       Walks with walker  Hematological: Negative.   Psychiatric/Behavioral: Negative.   All other systems reviewed and  are negative.    Allergies  Nsaids and Pioglitazone  Home Medications   Current Outpatient Rx  Name Route Sig Dispense Refill  . ALBUTEROL SULFATE HFA 108 (90 BASE) MCG/ACT IN AERS Inhalation Inhale 1 puff into the lungs every 4 (four) hours as needed for wheezing or shortness of breath. 1 Inhaler 0  . AMLODIPINE BESYLATE 5 MG PO TABS Oral Take 1 tablet (5 mg total) by mouth daily. 90 tablet 3  . ATORVASTATIN CALCIUM 40 MG PO TABS Oral Take 1 tablet (40 mg total) by mouth  daily. 30 tablet 11  . CLONAZEPAM 2 MG PO TABS  TAKE ONE TABLET BY MOUTH TWICE DAILY AS NEEDED FOR ANXIETY 60 tablet 5  . CYCLOBENZAPRINE HCL 5 MG PO TABS Oral Take 5 mg by mouth 3 (three) times daily as needed. For muscle spasms    . DULOXETINE HCL 60 MG PO CPEP Oral Take 60 mg by mouth daily.    Marland Kitchen FERROUS SULFATE 325 (65 FE) MG PO TABS  1 tab by mouth twice per day 30 tablet 11  . FLUTICASONE PROPIONATE 50 MCG/ACT NA SUSP Nasal Place 2 sprays into the nose daily.      . FUROSEMIDE 80 MG PO TABS Oral Take 1 tablet (80 mg total) by mouth 2 (two) times daily.    Marland Kitchen GLIMEPIRIDE 1 MG PO TABS Oral Take 1 mg by mouth every morning before breakfast. 1 po every day     . HYDROXYZINE HCL 25 MG PO TABS Oral Take 25 mg by mouth every 6 (six) hours as needed. For anxiety    . LEVOTHYROXINE SODIUM 112 MCG PO TABS Oral Take 112 mcg by mouth daily.    . MOMETASONE FURO-FORMOTEROL FUM 100-5 MCG/ACT IN AERO Inhalation Inhale 2 puffs into the lungs 2 (two) times daily. 1 Inhaler 6  . MONTELUKAST SODIUM 10 MG PO TABS Oral Take 1 tablet (10 mg total) by mouth daily. 30 tablet 11  . OMEPRAZOLE 40 MG PO CPDR Oral Take 1 capsule (40 mg total) by mouth daily. 30 capsule 11  . PREGABALIN 100 MG PO CAPS Oral Take 200 mg by mouth daily.    Marland Kitchen PROMETHAZINE HCL 25 MG PO TABS Oral Take 25 mg by mouth every 8 (eight) hours as needed. For nausea    . TRAZODONE HCL 100 MG PO TABS Oral Take 0.5 tablets (50 mg total) by mouth at bedtime as needed. For sleep      BP 114/60  Pulse 87  Temp 98.6 F (37 C) (Oral)  Resp 16  SpO2 100%  Physical Exam  Nursing note and vitals reviewed. Constitutional: She is oriented to person, place, and time. She appears well-developed and well-nourished.  HENT:  Head: Normocephalic and atraumatic.       Mucous membranes dry  Eyes: Conjunctivae are normal. Pupils are equal, round, and reactive to light.  Neck: Neck supple. No tracheal deviation present. No thyromegaly present.    Cardiovascular: Normal rate and regular rhythm.   No murmur heard. Pulmonary/Chest: Effort normal and breath sounds normal.  Abdominal: Soft. Bowel sounds are normal. She exhibits no distension. There is no tenderness.  Musculoskeletal: Normal range of motion. She exhibits no edema and no tenderness.  Neurological: She is alert and oriented to person, place, and time. She displays normal reflexes. No cranial nerve deficit. Coordination normal.       Walks with assistance, gait shuffling  Skin: Skin is warm and dry. No rash noted.  Psychiatric: She  has a normal mood and affect.    ED Course  Procedures (including critical care time)  Date: 03/08/2012  Rate: 80  Rhythm: normal sinus rhythm  QRS Axis: normal  Intervals: normal  ST/T Wave abnormalities: normal  Conduction Disutrbances:none  Narrative Interpretation:   Old EKG Reviewed: unchanged Unchanged from 09/05/2010 interpreted by me  Labs Reviewed  CBC  URINALYSIS, ROUTINE W REFLEX MICROSCOPIC  CK   No results found. 11:15 PM patient resting comfortably eating in bed Glasgow Coma Score 15 No diagnosis found.  Results for orders placed during the hospital encounter of 03/08/12  CBC      Component Value Range   WBC 7.2  4.0 - 10.5 K/uL   RBC 3.39 (*) 3.87 - 5.11 MIL/uL   Hemoglobin 10.5 (*) 12.0 - 15.0 g/dL   HCT 16.1 (*) 09.6 - 04.5 %   MCV 91.7  78.0 - 100.0 fL   MCH 31.0  26.0 - 34.0 pg   MCHC 33.8  30.0 - 36.0 g/dL   RDW 40.9 (*) 81.1 - 91.4 %   Platelets 196  150 - 400 K/uL  URINALYSIS, ROUTINE W REFLEX MICROSCOPIC      Component Value Range   Color, Urine YELLOW  YELLOW   APPearance CLOUDY (*) CLEAR   Specific Gravity, Urine 1.009  1.005 - 1.030   pH 7.0  5.0 - 8.0   Glucose, UA NEGATIVE  NEGATIVE mg/dL   Hgb urine dipstick SMALL (*) NEGATIVE   Bilirubin Urine NEGATIVE  NEGATIVE   Ketones, ur NEGATIVE  NEGATIVE mg/dL   Protein, ur 782 (*) NEGATIVE mg/dL   Urobilinogen, UA 0.2  0.0 - 1.0 mg/dL   Nitrite  NEGATIVE  NEGATIVE   Leukocytes, UA LARGE (*) NEGATIVE  CK      Component Value Range   Total CK 84  7 - 177 U/L  POCT I-STAT, CHEM 8      Component Value Range   Sodium 140  135 - 145 mEq/L   Potassium 4.2  3.5 - 5.1 mEq/L   Chloride 107  96 - 112 mEq/L   BUN 63 (*) 6 - 23 mg/dL   Creatinine, Ser 9.56 (*) 0.50 - 1.10 mg/dL   Glucose, Bld 213 (*) 70 - 99 mg/dL   Calcium, Ion 0.86 (*) 1.13 - 1.30 mmol/L   TCO2 24  0 - 100 mmol/L   Hemoglobin 10.5 (*) 12.0 - 15.0 g/dL   HCT 57.8 (*) 46.9 - 62.9 %  POCT I-STAT TROPONIN I      Component Value Range   Troponin i, poc 0.01  0.00 - 0.08 ng/mL   Comment 3           URINE MICROSCOPIC-ADD ON      Component Value Range   Squamous Epithelial / LPF RARE  RARE   WBC, UA 21-50  <3 WBC/hpf   RBC / HPF 3-6  <3 RBC/hpf   Bacteria, UA MANY (*) RARE   Dg Chest 2 View  03/08/2012  *RADIOLOGY REPORT*  Clinical Data: Weakness and pain.  CHEST - 2 VIEW  Comparison: 02/22/2013from [facility].  Findings: Shallow inspiration with elevation of the left hemidiaphragm.  Heart size and pulmonary vascularity are normal. Focal linear scarring in the left midlung is stable since previous study.  Suggestion of atelectasis or vascular crowding in the right lung base.  No focal consolidation.  No blunting of costophrenic angles.  No pneumothorax.  Calcified and tortuous aorta.  Surgical clips  in the base of the neck.  Degenerative changes in the spine and shoulders.  IMPRESSION: Elevation of left hemidiaphragm with linear scarring in the left lung.  Atelectasis versus vascular crowding in the right lung base. Stable appearance since previous study.  Original Report Authenticated By: Marlon Pel, M.D.   Ct Head Wo Contrast  03/08/2012  *RADIOLOGY REPORT*  Clinical Data: Fall with head injury and headache.  CT HEAD WITHOUT CONTRAST  Technique:  Contiguous axial images were obtained from the base of the skull through the vertex without contrast.  Comparison:  08/18/2011 MRI and 08/17/2011 CT.  Findings: Mild atrophy and chronic small vessel white matter ischemic changes again noted.  No acute intracranial abnormalities are identified, including mass lesion or mass effect, hydrocephalus, extra-axial fluid collection, midline shift, hemorrhage, or acute infarction.  The visualized bony calvarium is unremarkable.  IMPRESSION: No evidence of acute intracranial abnormality.  Unchanged atrophy and chronic small vessel white matter ischemic changes.  Original Report Authenticated By: Rosendo Gros, M.D.     MDM  Spoke with Dr. Lovell Sheehan who will arrange for admission Plan urine culture intravenous antibiotics  Hydration Diagnosis  #1 renal failure #2 UTI #3 anemia #4 falls #5 weakness        Doug Sou, MD 03/09/12 [phone]

## 2012-03-08 NOTE — ED Notes (Addendum)
Pt from home via [facility] to room 14. Pt was sitting upright in the floor after attempting to walk to the door with assist of walker. Pt sts her knees gave out on her and she fell. Neighbor came in and called 911. Pt is A/O x 4. VSS. EMS reports no air condition in home or that was turned on.  Pt does live by herself and is unable to put weight on her feet. CBG 89, 98 RA, RR 18, 112/54, 96 HR. Per EMS, house was disheveled and pt appeared to have poor hygiene.

## 2012-03-09 ENCOUNTER — Inpatient Hospital Stay (HOSPITAL_COMMUNITY): Payer: Medicare Other

## 2012-03-09 ENCOUNTER — Encounter (HOSPITAL_COMMUNITY): Payer: Self-pay | Admitting: Internal Medicine

## 2012-03-09 DIAGNOSIS — J309 Allergic rhinitis, unspecified: Secondary | ICD-10-CM

## 2012-03-09 DIAGNOSIS — N19 Unspecified kidney failure: Secondary | ICD-10-CM

## 2012-03-09 DIAGNOSIS — D649 Anemia, unspecified: Secondary | ICD-10-CM

## 2012-03-09 DIAGNOSIS — J449 Chronic obstructive pulmonary disease, unspecified: Secondary | ICD-10-CM

## 2012-03-09 DIAGNOSIS — IMO0001 Reserved for inherently not codable concepts without codable children: Secondary | ICD-10-CM | POA: Diagnosis present

## 2012-03-09 DIAGNOSIS — J209 Acute bronchitis, unspecified: Secondary | ICD-10-CM

## 2012-03-09 DIAGNOSIS — F17200 Nicotine dependence, unspecified, uncomplicated: Secondary | ICD-10-CM

## 2012-03-09 DIAGNOSIS — N390 Urinary tract infection, site not specified: Secondary | ICD-10-CM

## 2012-03-09 DIAGNOSIS — R5381 Other malaise: Secondary | ICD-10-CM

## 2012-03-09 DIAGNOSIS — N179 Acute kidney failure, unspecified: Principal | ICD-10-CM

## 2012-03-09 LAB — RETICULOCYTES
RBC.: 3.08 MIL/uL — ABNORMAL LOW (ref 3.87–5.11)
Retic Count, Absolute: 40 10*3/uL (ref 19.0–186.0)
Retic Ct Pct: 1.3 % (ref 0.4–3.1)

## 2012-03-09 LAB — SODIUM, URINE, RANDOM: Sodium, Ur: 55 mEq/L

## 2012-03-09 LAB — CBC
HCT: 28.7 % — ABNORMAL LOW (ref 36.0–46.0)
MCH: 31.2 pg (ref 26.0–34.0)
MCHC: 33.4 g/dL (ref 30.0–36.0)
MCV: 93.2 fL (ref 78.0–100.0)
Platelets: 199 10*3/uL (ref 150–400)
RDW: 15.9 % — ABNORMAL HIGH (ref 11.5–15.5)
WBC: 5.4 10*3/uL (ref 4.0–10.5)

## 2012-03-09 LAB — HEMOGLOBIN A1C: Hgb A1c MFr Bld: 7.2 % — ABNORMAL HIGH (ref <5.7)

## 2012-03-09 LAB — HEPATIC FUNCTION PANEL
ALT: 9 U/L (ref 0–35)
AST: 14 U/L (ref 0–37)
Albumin: 3.3 g/dL — ABNORMAL LOW (ref 3.5–5.2)
Bilirubin, Direct: <0.1 mg/dL (ref 0.0–0.3)
Total Protein: 6.8 g/dL (ref 6.0–8.3)

## 2012-03-09 LAB — BASIC METABOLIC PANEL
BUN: 63 mg/dL — ABNORMAL HIGH (ref 6–23)
CO2: 27 mEq/L (ref 19–32)
Calcium: 8.5 mg/dL (ref 8.4–10.5)
Creatinine, Ser: 5.08 mg/dL — ABNORMAL HIGH (ref 0.50–1.10)
Glucose, Bld: 149 mg/dL — ABNORMAL HIGH (ref 70–99)
Sodium: 141 mEq/L (ref 135–145)

## 2012-03-09 LAB — CREATININE, URINE, RANDOM: Creatinine, Urine: 70.96 mg/dL

## 2012-03-09 LAB — FERRITIN: Ferritin: 315 ng/mL — ABNORMAL HIGH (ref 10–291)

## 2012-03-09 LAB — GLUCOSE, CAPILLARY
Glucose-Capillary: 184 mg/dL — ABNORMAL HIGH (ref 70–99)
Glucose-Capillary: 87 mg/dL (ref 70–99)

## 2012-03-09 LAB — IRON AND TIBC
Iron: 33 ug/dL — ABNORMAL LOW (ref 42–135)
UIBC: 196 ug/dL (ref 125–400)

## 2012-03-09 LAB — PHOSPHORUS: Phosphorus: 4.9 mg/dL — ABNORMAL HIGH (ref 2.3–4.6)

## 2012-03-09 LAB — MAGNESIUM: Magnesium: 2.9 mg/dL — ABNORMAL HIGH (ref 1.5–2.5)

## 2012-03-09 MED ORDER — TRAZODONE HCL 50 MG PO TABS
50.0000 mg | ORAL_TABLET | Freq: Every evening | ORAL | Status: DC | PRN
  Administered 2012-03-10 – 2012-03-12 (×2): 50 mg via ORAL
  Filled 2012-03-09 (×2): qty 1

## 2012-03-09 MED ORDER — OXYCODONE HCL 5 MG PO TABS
5.0000 mg | ORAL_TABLET | ORAL | Status: DC | PRN
  Administered 2012-03-09 – 2012-03-14 (×11): 5 mg via ORAL
  Filled 2012-03-09 (×11): qty 1

## 2012-03-09 MED ORDER — NICOTINE 21 MG/24HR TD PT24
21.0000 mg | MEDICATED_PATCH | Freq: Every day | TRANSDERMAL | Status: DC
  Administered 2012-03-09 – 2012-03-14 (×6): 21 mg via TRANSDERMAL
  Filled 2012-03-09 (×6): qty 1

## 2012-03-09 MED ORDER — DEXTROSE-NACL 5-0.45 % IV SOLN
INTRAVENOUS | Status: DC
  Administered 2012-03-09: 1000 mL via INTRAVENOUS

## 2012-03-09 MED ORDER — ALBUTEROL SULFATE (5 MG/ML) 0.5% IN NEBU
2.5000 mg | INHALATION_SOLUTION | Freq: Four times a day (QID) | RESPIRATORY_TRACT | Status: DC
  Administered 2012-03-09: 2.5 mg via RESPIRATORY_TRACT
  Filled 2012-03-09: qty 0.5

## 2012-03-09 MED ORDER — HYDROMORPHONE HCL PF 1 MG/ML IJ SOLN
0.5000 mg | INTRAMUSCULAR | Status: DC | PRN
  Administered 2012-03-09 – 2012-03-10 (×4): 1 mg via INTRAVENOUS
  Administered 2012-03-11: 0.5 mg via INTRAVENOUS
  Filled 2012-03-09 (×8): qty 1

## 2012-03-09 MED ORDER — ALBUTEROL SULFATE HFA 108 (90 BASE) MCG/ACT IN AERS: 1.0000 | INHALATION_SPRAY | RESPIRATORY_TRACT | Status: DC | PRN

## 2012-03-09 MED ORDER — ALBUTEROL SULFATE (5 MG/ML) 0.5% IN NEBU: 2.5000 mg | INHALATION_SOLUTION | RESPIRATORY_TRACT | Status: DC | PRN

## 2012-03-09 MED ORDER — BUDESONIDE-FORMOTEROL FUMARATE 80-4.5 MCG/ACT IN AERO
2.0000 | INHALATION_SPRAY | Freq: Two times a day (BID) | RESPIRATORY_TRACT | Status: DC
  Administered 2012-03-09 – 2012-03-14 (×10): 2 via RESPIRATORY_TRACT

## 2012-03-09 MED ORDER — DULOXETINE HCL 60 MG PO CPEP
60.0000 mg | ORAL_CAPSULE | Freq: Every day | ORAL | Status: DC
  Administered 2012-03-09 – 2012-03-13 (×5): 60 mg via ORAL
  Filled 2012-03-09 (×7): qty 1

## 2012-03-09 MED ORDER — ZOLPIDEM TARTRATE 5 MG PO TABS: 5.0000 mg | ORAL_TABLET | Freq: Every evening | ORAL | Status: DC | PRN

## 2012-03-09 MED ORDER — CYCLOBENZAPRINE HCL 10 MG PO TABS
5.0000 mg | ORAL_TABLET | Freq: Three times a day (TID) | ORAL | Status: DC | PRN
  Administered 2012-03-09: 5 mg via ORAL
  Filled 2012-03-09: qty 2

## 2012-03-09 MED ORDER — ATORVASTATIN CALCIUM 40 MG PO TABS
40.0000 mg | ORAL_TABLET | Freq: Every day | ORAL | Status: DC
  Administered 2012-03-09 – 2012-03-14 (×6): 40 mg via ORAL
  Filled 2012-03-09 (×6): qty 1

## 2012-03-09 MED ORDER — HYDROXYZINE HCL 25 MG PO TABS: 25.0000 mg | ORAL_TABLET | Freq: Four times a day (QID) | ORAL | Status: DC | PRN

## 2012-03-09 MED ORDER — MOMETASONE FURO-FORMOTEROL FUM 100-5 MCG/ACT IN AERO: 2.0000 | INHALATION_SPRAY | Freq: Two times a day (BID) | RESPIRATORY_TRACT | Status: DC

## 2012-03-09 MED ORDER — FERROUS SULFATE 325 (65 FE) MG PO TABS
325.0000 mg | ORAL_TABLET | Freq: Two times a day (BID) | ORAL | Status: DC
  Administered 2012-03-09 – 2012-03-11 (×6): 325 mg via ORAL
  Filled 2012-03-09 (×10): qty 1

## 2012-03-09 MED ORDER — AMLODIPINE BESYLATE 5 MG PO TABS
5.0000 mg | ORAL_TABLET | Freq: Every day | ORAL | Status: DC
  Administered 2012-03-10 – 2012-03-14 (×5): 5 mg via ORAL
  Filled 2012-03-09 (×6): qty 1

## 2012-03-09 MED ORDER — DEXTROSE 5 % IV SOLN
1.0000 g | Freq: Once | INTRAVENOUS | Status: AC
  Administered 2012-03-09: 1 g via INTRAVENOUS
  Filled 2012-03-09: qty 10

## 2012-03-09 MED ORDER — ONDANSETRON HCL 4 MG PO TABS: 4.0000 mg | ORAL_TABLET | Freq: Four times a day (QID) | ORAL | Status: DC | PRN

## 2012-03-09 MED ORDER — IPRATROPIUM BROMIDE 0.02 % IN SOLN
0.5000 mg | Freq: Four times a day (QID) | RESPIRATORY_TRACT | Status: DC
  Administered 2012-03-09: 0.5 mg via RESPIRATORY_TRACT
  Filled 2012-03-09: qty 2.5

## 2012-03-09 MED ORDER — BUDESONIDE-FORMOTEROL FUMARATE 80-4.5 MCG/ACT IN AERO
2.0000 | INHALATION_SPRAY | Freq: Two times a day (BID) | RESPIRATORY_TRACT | Status: DC
  Filled 2012-03-09: qty 6.9

## 2012-03-09 MED ORDER — SODIUM CHLORIDE 0.9 % IV SOLN
INTRAVENOUS | Status: DC
  Administered 2012-03-09 – 2012-03-10 (×4): via INTRAVENOUS
  Administered 2012-03-11: 75 mL/h via INTRAVENOUS

## 2012-03-09 MED ORDER — ONDANSETRON HCL 4 MG/2ML IJ SOLN: 4.0000 mg | Freq: Four times a day (QID) | INTRAMUSCULAR | Status: DC | PRN

## 2012-03-09 MED ORDER — DEXTROSE 5 % IV SOLN
1.0000 g | INTRAVENOUS | Status: DC
  Administered 2012-03-09 – 2012-03-11 (×3): 1 g via INTRAVENOUS
  Filled 2012-03-09 (×4): qty 10

## 2012-03-09 MED ORDER — INSULIN ASPART 100 UNIT/ML ~~LOC~~ SOLN: 0.0000 [IU] | Freq: Every day | SUBCUTANEOUS | Status: DC

## 2012-03-09 MED ORDER — ALUM & MAG HYDROXIDE-SIMETH 200-200-20 MG/5ML PO SUSP
30.0000 mL | Freq: Four times a day (QID) | ORAL | Status: DC | PRN
  Administered 2012-03-10: 30 mL via ORAL
  Filled 2012-03-09 (×2): qty 30

## 2012-03-09 MED ORDER — MONTELUKAST SODIUM 10 MG PO TABS
10.0000 mg | ORAL_TABLET | Freq: Every day | ORAL | Status: DC
  Administered 2012-03-09 – 2012-03-14 (×6): 10 mg via ORAL
  Filled 2012-03-09 (×7): qty 1

## 2012-03-09 MED ORDER — ENOXAPARIN SODIUM 30 MG/0.3ML ~~LOC~~ SOLN
30.0000 mg | SUBCUTANEOUS | Status: DC
  Administered 2012-03-10 – 2012-03-14 (×5): 30 mg via SUBCUTANEOUS
  Filled 2012-03-09 (×5): qty 0.3

## 2012-03-09 MED ORDER — INSULIN ASPART 100 UNIT/ML ~~LOC~~ SOLN
0.0000 [IU] | Freq: Three times a day (TID) | SUBCUTANEOUS | Status: DC
  Administered 2012-03-09 – 2012-03-14 (×3): 1 [IU] via SUBCUTANEOUS

## 2012-03-09 MED ORDER — ALBUTEROL SULFATE HFA 108 (90 BASE) MCG/ACT IN AERS: 2.0000 | INHALATION_SPRAY | RESPIRATORY_TRACT | Status: DC | PRN

## 2012-03-09 MED ORDER — CLONAZEPAM 1 MG PO TABS
2.0000 mg | ORAL_TABLET | Freq: Two times a day (BID) | ORAL | Status: DC | PRN
  Administered 2012-03-09 – 2012-03-11 (×4): 2 mg via ORAL
  Filled 2012-03-09: qty 2
  Filled 2012-03-09 (×2): qty 1
  Filled 2012-03-09 (×2): qty 2

## 2012-03-09 MED ORDER — ACETAMINOPHEN 650 MG RE SUPP: 650.0000 mg | Freq: Four times a day (QID) | RECTAL | Status: DC | PRN

## 2012-03-09 MED ORDER — ACETAMINOPHEN 325 MG PO TABS: 650.0000 mg | ORAL_TABLET | Freq: Four times a day (QID) | ORAL | Status: DC | PRN

## 2012-03-09 MED ORDER — GLIMEPIRIDE 1 MG PO TABS
1.0000 mg | ORAL_TABLET | Freq: Every day | ORAL | Status: DC
  Administered 2012-03-09 – 2012-03-12 (×4): 1 mg via ORAL
  Filled 2012-03-09 (×6): qty 1

## 2012-03-09 MED ORDER — PANTOPRAZOLE SODIUM 40 MG PO TBEC
40.0000 mg | DELAYED_RELEASE_TABLET | Freq: Every day | ORAL | Status: DC
  Administered 2012-03-09 – 2012-03-14 (×6): 40 mg via ORAL
  Filled 2012-03-09 (×7): qty 1

## 2012-03-09 MED ORDER — FLUTICASONE PROPIONATE 50 MCG/ACT NA SUSP
2.0000 | Freq: Every day | NASAL | Status: DC
  Administered 2012-03-09 – 2012-03-14 (×6): 2 via NASAL
  Filled 2012-03-09: qty 16

## 2012-03-09 MED ORDER — LEVOTHYROXINE SODIUM 112 MCG PO TABS
112.0000 ug | ORAL_TABLET | Freq: Every day | ORAL | Status: DC
  Administered 2012-03-09 – 2012-03-14 (×6): 112 ug via ORAL
  Filled 2012-03-09 (×7): qty 1

## 2012-03-09 MED ORDER — PREGABALIN 100 MG PO CAPS
200.0000 mg | ORAL_CAPSULE | Freq: Every day | ORAL | Status: DC
  Administered 2012-03-09 – 2012-03-10 (×2): 100 mg via ORAL
  Administered 2012-03-11 – 2012-03-13 (×3): 200 mg via ORAL
  Filled 2012-03-09: qty 2
  Filled 2012-03-09: qty 1
  Filled 2012-03-09 (×4): qty 2

## 2012-03-09 MED ORDER — ENOXAPARIN SODIUM 40 MG/0.4ML ~~LOC~~ SOLN
40.0000 mg | SUBCUTANEOUS | Status: DC
  Administered 2012-03-09: 40 mg via SUBCUTANEOUS
  Filled 2012-03-09: qty 0.4

## 2012-03-09 MED ORDER — PROMETHAZINE HCL 25 MG PO TABS: 25.0000 mg | ORAL_TABLET | Freq: Three times a day (TID) | ORAL | Status: DC | PRN

## 2012-03-09 NOTE — Progress Notes (Signed)
Patient ID: [name] [name]  female  XBJ:[id number]    DOB: 1941-01-24    DOA: 03/08/2012  PCP: Oliver Barre, MD  Subjective: Patient seen and examined, admitted earlier this morning by Dr. Lovell Sheehan. Still somewhat wheezy, denies any chest pain/tightness, fevers or chills, abdominal pain. Still feels weak (generalized)  Objective: Weight change:   Intake/Output Summary (Last 24 hours) at 03/09/12 1013 Last data filed at 03/09/12 0736  Gross per 24 hour  Intake    740 ml  Output      0 ml  Net    740 ml   Blood pressure 115/61, pulse 83, temperature 97.6 F (36.4 C), temperature source Oral, resp. rate 16, height 5\' 7"  (1.702 m), weight 80.4 kg (177 lb 4 oz), SpO2 96.00%.  Physical Exam: General: Alert and awake, oriented x3, not in any acute distress. HEENT: anicteric sclera, pupils reactive to light and accommodation, EOMI CVS: S1-S2 clear, no murmur rubs or gallops Chest: Expiratory wheezing bilaterally Abdomen: soft nontender, nondistended, normal bowel sounds, no organomegaly Extremities: no cyanosis, clubbing or edema noted bilaterally Neuro: Cranial nerves II-XII intact, no focal neurological deficits  Lab Results: Basic Metabolic Panel:  Lab 03/09/12 3086 03/08/12 2045  NA 141 140  K 4.0 4.2  CL 103 107  CO2 27 --  GLUCOSE 149* 104*  BUN 63* 63*  CREATININE 5.08* 5.40*  CALCIUM 8.5 --  MG -- --  PHOS -- --   CBC:  Lab 03/09/12 0500 03/08/12 2045 03/08/12 2028  WBC 5.4 -- 7.2  NEUTROABS -- -- --  HGB 9.6* 10.5* --  HCT 28.7* 31.0* --  MCV 93.2 -- 91.7  PLT 199 -- 196   Cardiac Enzymes:  Lab 03/08/12 2028  CKTOTAL 84  CKMB --  CKMBINDEX --  TROPONINI --   BNP: No components found with this basename: POCBNP:2 CBG:  Lab 03/09/12 0733 03/09/12 0202  GLUCAP 136* 184*     Micro Results: No results found for this or any previous visit (from the past 240 hour(s)).  Studies/Results: Dg Chest 2 View  03/08/2012  *RADIOLOGY REPORT*  Clinical Data: Weakness  and pain.  CHEST - 2 VIEW  Comparison: 02/22/2013from [facility].  Findings: Shallow inspiration with elevation of the left hemidiaphragm.  Heart size and pulmonary vascularity are normal. Focal linear scarring in the left midlung is stable since previous study.  Suggestion of atelectasis or vascular crowding in the right lung base.  No focal consolidation.  No blunting of costophrenic angles.  No pneumothorax.  Calcified and tortuous aorta.  Surgical clips in the base of the neck.  Degenerative changes in the spine and shoulders.  IMPRESSION: Elevation of left hemidiaphragm with linear scarring in the left lung.  Atelectasis versus vascular crowding in the right lung base. Stable appearance since previous study.  Original Report Authenticated By: Marlon Pel, M.D.   Ct Head Wo Contrast  03/08/2012  *RADIOLOGY REPORT*  Clinical Data: Fall with head injury and headache.  CT HEAD WITHOUT CONTRAST  Technique:  Contiguous axial images were obtained from the base of the skull through the vertex without contrast.  Comparison: 08/18/2011 MRI and 08/17/2011 CT.  Findings: Mild atrophy and chronic small vessel white matter ischemic changes again noted.  No acute intracranial abnormalities are identified, including mass lesion or mass effect, hydrocephalus, extra-axial fluid collection, midline shift, hemorrhage, or acute infarction.  The visualized bony calvarium is unremarkable.  IMPRESSION: No evidence of acute intracranial abnormality.  Unchanged atrophy and chronic  small vessel white matter ischemic changes.  Original Report Authenticated By: Rosendo Gros, M.D.    Medications: Scheduled Meds:   . albuterol  2.5 mg Nebulization Q6H  . amLODipine  5 mg Oral Daily  . atorvastatin  40 mg Oral Daily  . budesonide-formoterol  2 puff Inhalation BID  . cefTRIAXone (ROCEPHIN)  IV  1 g Intravenous Once  . cefTRIAXone (ROCEPHIN)  IV  1 g Intravenous Q24H  . DULoxetine  60 mg Oral Daily  . enoxaparin  (LOVENOX) injection  40 mg Subcutaneous Q24H  . ferrous sulfate  325 mg Oral BID WC  . fluticasone  2 spray Each Nare Daily  . glimepiride  1 mg Oral QAC breakfast  . insulin aspart  0-5 Units Subcutaneous QHS  . insulin aspart  0-9 Units Subcutaneous TID WC  . ipratropium  0.5 mg Nebulization Q6H  . levothyroxine  112 mcg Oral Q0600  . montelukast  10 mg Oral Daily  . pantoprazole  40 mg Oral Q1200  . pregabalin  200 mg Oral Daily  . sodium chloride  1,000 mL Intravenous Once  . DISCONTD: budesonide-formoterol  2 puff Inhalation BID  . DISCONTD: mometasone-formoterol  2 puff Inhalation BID   Continuous Infusions:   . sodium chloride 100 mL/hr at 03/09/12 0811  . DISCONTD: dextrose 5 % and 0.45% NaCl 1,000 mL (03/09/12 0056)     Assessment/Plan: Principal Problem:  *Renal failure (ARF), acute on chronic - Continue gentle hydration, possibly precipitated due to UTI, dehydration - Renal ultrasound ordered, follows Dr. Lowell Guitar, renal consult will be obtained, baseline creatinine has been ~ 4, not on HD yet.   Active Problems:  HYPOTHYROIDISM: Continue Synthroid, check TSH   DIABETES MELLITUS, TYPE II: - Continue sliding scale insulin, Amaryl   HYPERLIPIDEMIA: Continue statins   ANEMIA-NOS: Likely anemia of chronic disease   CIGARETTE SMOKER - Consultation on smoking cessation   HYPERTENSION: Stable   Generalized weakness: - Increasing falls likely secondary  to UTI and acute renal insufficiency, will obtain PT evaluation   UTI (lower urinary tract infection): - Follow urine culture, continue Rocephin   COPD bronchitis - Placed on scheduled nebs, Symbicort, continue Rocephin, O2 supplementation   DVT Prophylaxis: Lovenox  Code Status: Full  Disposition: Not medically ready   LOS: 1 day   RAI,RIPUDEEP M.D. [facility] 03/09/2012, 10:13 AM Pager: [phone]  If 7PM-7AM, please contact night-coverage [email or web address] Password [email or web address]

## 2012-03-09 NOTE — Progress Notes (Signed)
Physical Therapy Evaluation Patient Details Name: [name] [name] MRN: [id number] DOB: 12-15-1940 Today's Date: 03/09/2012 Time: 3086-5784 PT Time Calculation (min): 16 min  PT Assessment / Plan / Recommendation Clinical Impression  71 yo female admitted with renal dysfunction presents to PT with decr functional mobility, dependence on UE support for balance with amb; Will benefit from PT to maximize independence and safety with mobilty, and enabl ept to safely dc home    PT Assessment  Patient needs continued PT services    Follow Up Recommendations  Home health PT    Barriers to Discharge Decreased caregiver support Must be modified independent to dc home    Equipment Recommendations   (possibly RW; to be determined)    Recommendations for Other Services     Frequency Min 3X/week    Precautions / Restrictions Precautions Precautions: Fall   Pertinent Vitals/Pain No specific reports      Mobility  Bed Mobility Bed Mobility:  (Pt presented sitting EOB) Transfers Transfers: Sit to Stand;Stand to Sit Sit to Stand: 4: Min assist;From bed Stand to Sit: 4: Min assist;To toilet Details for Transfer Assistance: Dependent on UE support for balance, especially at initial stand; Noted decr control of stand to sit Ambulation/Gait Ambulation/Gait Assistance: 4: Min assist Ambulation Distance (Feet): 20 Feet Assistive device: 1 person hand held assist Ambulation/Gait Assistance Details: cues for posture; 1 person handheld assist and other UE pushing IV pole    Exercises     PT Diagnosis: Difficulty walking;Generalized weakness  PT Problem List: Decreased strength;Decreased activity tolerance;Decreased balance;Decreased knowledge of use of DME PT Treatment Interventions: DME instruction;Gait training;Stair training;Functional mobility training;Therapeutic activities;Therapeutic exercise;Patient/family education   PT Goals Acute Rehab PT Goals PT Goal Formulation: With patient Time  For Goal Achievement: 03/23/12 Potential to Achieve Goals: Good Pt will go Supine/Side to Sit: Independently PT Goal: Supine/Side to Sit - Progress: Goal set today Pt will go Sit to Supine/Side: Independently PT Goal: Sit to Supine/Side - Progress: Goal set today Pt will go Sit to Stand: with modified independence PT Goal: Sit to Stand - Progress: Goal set today Pt will go Stand to Sit: with modified independence PT Goal: Stand to Sit - Progress: Goal set today Pt will Ambulate: >150 feet;with modified independence;with least restrictive assistive device PT Goal: Ambulate - Progress: Goal set today Pt will Go Up / Down Stairs: 3-5 stairs;with modified independence;with least restrictive assistive device PT Goal: Up/Down Stairs - Progress: Goal set today  Visit Information  Last PT Received On: 03/09/12 Assistance Needed: +1    Subjective Data  Subjective: Agreeable to amb Patient Stated Goal: get better   Prior Functioning  Home Living Lives With: Alone Available Help at Discharge:  (Need more info about available assist) Type of Home: House Home Access: Stairs to enter [employer] of Steps: 4 (3 to porch, then 1) Entrance Stairs-Rails: None (must corroborate this) Home Layout: One level Home Adaptive Equipment: Straight cane (lift chair) Prior Function Level of Independence: Independent Able to Take Stairs?: Yes Driving: No (takes SCAT to the store) Communication Communication: No difficulties    Cognition  Overall Cognitive Status: Appears within functional limits for tasks assessed/performed Arousal/Alertness: Awake/alert Orientation Level: Appears intact for tasks assessed Behavior During Session: [facility] for tasks performed    Extremity/Trunk Assessment Right Upper Extremity Assessment RUE ROM/Strength/Tone: [facility] for tasks assessed Left Upper Extremity Assessment LUE ROM/Strength/Tone: [facility] for tasks assessed Right Lower Extremity Assessment RLE  ROM/Strength/Tone: Deficits RLE ROM/Strength/Tone Deficits: Generalized weakness, with noted  decr control of descent with stand to sit Left Lower Extremity Assessment LLE ROM/Strength/Tone: Deficits LLE ROM/Strength/Tone Deficits: Generalized weakness, with noted decr control of descent with stand to sit Trunk Assessment Trunk Assessment: Normal   Balance    End of Session PT - End of Session Activity Tolerance: Patient tolerated treatment well Patient left: with nursing in room;with call bell/phone within reach (; pullstring within reach) Nurse Communication: Mobility status  GP     Van Clines Hamff 03/09/2012, 4:59 PM

## 2012-03-09 NOTE — Progress Notes (Signed)
Patient arrived to the floor from the ED via stretcher at approximately 0130.  She is drowsy but arousable and A&O x 4.  Transferred to the bed.  Placed on telemetry.  Given happy meal since she is hungry.  She refused for her purse to be placed in the safe.  Advised that we are not responsible for valuables and she understands.  Tattiana Fakhouri, Justine Null

## 2012-03-09 NOTE — H&P (Signed)
[facility] History and Physical  Laelia Angelo Kovack ZOX:[id number] DOB: [birth year] DOA: 03/08/2012  Referring physician: EDP PCP: Oliver Barre, MD     Chief Complaint:  Weakness and Increased Falls   HPI:  [name] [name] is a 71 y.o. Female who presented to the ED with complaints of increased falls and worsening weakness over the past 2 weeks.  She fell in her home this afternoon and was on the floor for 45 minutes and EMS was called.  She denies having any syncope, or seizures, or chest pain or SOB.  She has not had any nausea vomiting or diarrhea or fevers or chills, anorexia, or poor appetite.  She walks with a walker normally due to gait instability.    In the ED she was found to have an elevated BUN/Cr of 64/5.4 which was increased more than her usual, and also she was found to have a UTI.  A Ct scan of the Brain was also done and was found to be negative for acute findings.  She was referred for admission.     Review of Systems:  The patient denies anorexia, fever, weight loss, vision loss, decreased hearing, hoarseness, chest pain, syncope, dyspnea on exertion, peripheral edema, hemoptysis, abdominal pain, melena, hematochezia, severe indigestion/heartburn, hematuria, incontinence, genital sores, suspicious skin lesions, transient blindness, depression, unusual weight change, abnormal bleeding, enlarged lymph nodes, angioedema, and breast masses.  Past Medical History  Diagnosis Date  . HYPOTHYROIDISM 02/17/2007  . DIABETES MELLITUS, TYPE II 02/17/2007  . HYPERLIPIDEMIA 02/17/2007  . GOUT 05/29/2007  . ANEMIA-NOS 05/29/2007  . ANXIETY 03/23/2010  . 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 DEPRESSION 02/17/2007  . RESTLESS LEG SYNDROME 05/29/2007  . COMMON MIGRAINE 05/29/2007  . PERIPHERAL NEUROPATHY 05/29/2007  . HYPERTENSION 02/17/2007  . PERIPHERAL VASCULAR DISEASE 02/17/2007  . ASTHMATIC BRONCHITIS, ACUTE 10/08/2008  . COPD 05/29/2007  . GERD 02/17/2007  . PEPTIC ULCER DISEASE 05/29/2007  .  GASTROENTERITIS 12/02/2007  . RENAL INSUFFICIENCY 02/17/2007  . MENOPAUSAL DISORDER 05/25/2009  . Osteoarth NOS-L/Leg 03/11/2008  . SHOULDER PAIN, LEFT, CHRONIC 09/15/2008  . CERVICAL RADICULOPATHY, LEFT 05/27/2008  . BACK PAIN 12/12/2009  . OSTEOPENIA 09/22/2009  . SEIZURE DISORDER 02/17/2007  . HYPERSOMNIA 05/26/2010  . Memory loss 01/24/2010  . Abnormality of gait 07/30/2007  . ABDOMINAL PAIN -GENERALIZED 11/16/2009  . Personal history of colonic polyps 11/16/2009  . PERSISTENT DISORDER INITIATING/MAINTAINING SLEEP 08/22/2010  . INADEQUATE SLEEP HYGIENE 08/22/2010  . DYSPNEA 08/22/2010  . Wheezing 08/15/2010  . CHEST PAIN 08/15/2010  . CHRONIC OBSTRUCTIVE PULMONARY DISEASE, ACUTE EXACERBATION 09/08/2010  . Palpitations 09/08/2010  . Other diseases of vocal cords 09/19/2010  . HEART MURMUR, HX OF 10/02/2010  . 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 Chronic neck pain 12/13/2010  . Osteopenia 12/13/2010   Past Surgical History  Procedure Date  . Left toe amputated 2006  . Bunionectomy 1980  . Goiter removal 1997  . Stress cardiolite 06/18/2006  . Tranthoracic echocardiogram 06/18/2006  . Electrocardiogram 05/29/2006  . Cholecystectomy   . Rotator cuff repair Left    Dr. Lajoyce Corners     Prior to Admission medications   Medication Sig Start Date End Date Taking? Authorizing Provider  albuterol (PROVENTIL HFA;VENTOLIN HFA) 108 (90 BASE) MCG/ACT inhaler Inhale 1 puff into the lungs every 4 (four) hours as needed for wheezing or shortness of breath. 08/19/11 08/18/12 Yes Sorin Luanne Bras, MD  amLODipine (NORVASC) 5 MG tablet Take 1 tablet (5 mg total) by mouth daily. 10/03/11 10/02/12 Yes Corwin Levins, MD  atorvastatin (LIPITOR) 40 MG tablet Take 1 tablet (40 mg total) by mouth daily. 05/30/11  Yes Corwin Levins, MD  clonazePAM (KLONOPIN) 2 MG tablet TAKE ONE TABLET BY MOUTH TWICE DAILY AS NEEDED FOR ANXIETY 03/03/12  Yes Corwin Levins, MD  cyclobenzaprine (FLEXERIL) 5 MG tablet Take 5 mg by mouth 3 (three) times daily as  needed. For muscle spasms 08/10/11  Yes Corwin Levins, MD  DULoxetine (CYMBALTA) 60 MG capsule Take 60 mg by mouth daily.   Yes Historical Provider, MD  ferrous sulfate 325 (65 FE) MG tablet 1 tab by mouth twice per day 04/20/11  Yes Corwin Levins, MD  fluticasone [facility]) 50 MCG/ACT nasal spray Place 2 sprays into the nose daily.     Yes Historical Provider, MD  furosemide (LASIX) 80 MG tablet Take 1 tablet (80 mg total) by mouth 2 (two) times daily. 08/19/11  Yes Sorin Luanne Bras, MD  glimepiride (AMARYL) 1 MG tablet Take 1 mg by mouth every morning before breakfast. 1 po every day    Yes Historical Provider, MD  hydrOXYzine (ATARAX/VISTARIL) 25 MG tablet Take 25 mg by mouth every 6 (six) hours as needed. For anxiety   Yes Historical Provider, MD  levothyroxine (SYNTHROID, LEVOTHROID) 112 MCG tablet Take 112 mcg by mouth daily.   Yes Historical Provider, MD  mometasone-formoterol (DULERA) 100-5 MCG/ACT AERO Inhale 2 puffs into the lungs 2 (two) times daily. 08/21/11  Yes Barbaraann Share, MD  montelukast (SINGULAIR) 10 MG tablet Take 1 tablet (10 mg total) by mouth daily. 09/28/11 09/27/12 Yes Corwin Levins, MD  omeprazole (PRILOSEC) 40 MG capsule Take 1 capsule (40 mg total) by mouth daily. 12/17/11  Yes Corwin Levins, MD  pregabalin (LYRICA) 100 MG capsule Take 200 mg by mouth daily.   Yes Historical Provider, MD  promethazine (PHENERGAN) 25 MG tablet Take 25 mg by mouth every 8 (eight) hours as needed. For nausea   Yes Historical Provider, MD  traZODone (DESYREL) 100 MG tablet Take 0.5 tablets (50 mg total) by mouth at bedtime as needed. For sleep 08/19/11  Yes Sorin Luanne Bras, MD    Allergies  Allergen Reactions  . Nsaids     REACTION: renal dysfunction  . Pioglitazone     Actos REACTION: EDEMA    Social History:  Divorced and Lives Alone, Able to perform her ADLs.  She reports that she has been smoking Cigarettes.  She has a 11.75 pack-year smoking history. She does not use smokeless tobacco. She reports  that she does not drink alcohol or use illicit drugs.   Family History  Problem Relation Age of Onset  . Dementia Mother   . Coronary artery disease Other   . Hyperlipidemia Other   . Hypertension Other   . Ovarian cancer Other   . Stroke Other        GENERAL:  Pleasant 71 year old well nourished and well developed African American Female examined and in No discomfort or Acute Distress, Cooperative with exam.    Pulse: 87 84  Temp: 98.6 F (37 C) 97.7 F (36.5 C)  TempSrc: Oral Oral  Resp: 16 17  SpO2: 100% 96%   Blood pressure 161/72, pulse 84, temperature 97.7 F (36.5 C), temperature source Oral, resp. rate 17, SpO2 96.00%. PSYCH: SHe is alert and oriented x4; does not appear anxious does not appear depressed; affect is normal HEENT: Normocephalic and Atraumatic, Mucous membranes pink; PERRLA; EOM intact; Fundi:  Benign;  No  scleral icterus, Nares: Patent, Oropharynx: Clear, Edentulous or Fair Dentition, Neck:  FROM, no cervical lymphadenopathy nor thyromegaly or carotid bruit; no JVD; Breasts:: Not examined CHEST WALL: No tenderness CHEST: Normal respiration, clear to auscultation bilaterally HEART: Regular rate and rhythm; no murmurs rubs or gallops BACK: No kyphosis or scoliosis; no CVA tenderness ABDOMEN: Positive Bowel Sounds, Scaphoid, Obese, soft non-tender; no masses, no organomegaly, no pannus; no intertriginous candida. Rectal Exam: Not done EXTREMITIES: No bone or joint deformity; age-appropriate arthropathy of the hands and knees; no cyanosis, clubbing or edema; no ulcerations. Genitalia: not examined PULSES: 2+ and symmetric SKIN: Normal hydration no rash or ulceration CNS: Cranial nerves 2-12 grossly intact no focal neurologic deficit    Labs on Admission:  Basic Metabolic Panel:  Lab 03/08/12 4098  NA 140  K 4.2  CL 107  CO2 --  GLUCOSE 104*  BUN 63*  CREATININE 5.40*  CALCIUM --  MG --  PHOS --   Liver Function Tests: No results found for  this basename: AST:5,ALT:5,ALKPHOS:5,BILITOT:5,PROT:5,ALBUMIN:5 in the last 168 hours No results found for this basename: LIPASE:5,AMYLASE:5 in the last 168 hours No results found for this basename: AMMONIA:5 in the last 168 hours CBC:  Lab 03/08/12 2045 03/08/12 2028  WBC -- 7.2  NEUTROABS -- --  HGB 10.5* 10.5*  HCT 31.0* 31.1*  MCV -- 91.7  PLT -- 196   Cardiac Enzymes:  Lab 03/08/12 2028  CKTOTAL 84  CKMB --  CKMBINDEX --  TROPONINI --    BNP (last 3 results) No results found for this basename: PROBNP:3 in the last 8760 hours CBG: No results found for this basename: GLUCAP:5 in the last 168 hours  Radiological Exams on Admission: Dg Chest 2 View  03/08/2012  *RADIOLOGY REPORT*  Clinical Data: Weakness and pain.  CHEST - 2 VIEW  Comparison: 02/22/2013from [facility].  Findings: Shallow inspiration with elevation of the left hemidiaphragm.  Heart size and pulmonary vascularity are normal. Focal linear scarring in the left midlung is stable since previous study.  Suggestion of atelectasis or vascular crowding in the right lung base.  No focal consolidation.  No blunting of costophrenic angles.  No pneumothorax.  Calcified and tortuous aorta.  Surgical clips in the base of the neck.  Degenerative changes in the spine and shoulders.  IMPRESSION: Elevation of left hemidiaphragm with linear scarring in the left lung.  Atelectasis versus vascular crowding in the right lung base. Stable appearance since previous study.  Original Report Authenticated By: Marlon Pel, M.D.   Ct Head Wo Contrast  03/08/2012  *RADIOLOGY REPORT*  Clinical Data: Fall with head injury and headache.  CT HEAD WITHOUT CONTRAST  Technique:  Contiguous axial images were obtained from the base of the skull through the vertex without contrast.  Comparison: 08/18/2011 MRI and 08/17/2011 CT.  Findings: Mild atrophy and chronic small vessel white matter ischemic changes again noted.  No acute intracranial  abnormalities are identified, including mass lesion or mass effect, hydrocephalus, extra-axial fluid collection, midline shift, hemorrhage, or acute infarction.  The visualized bony calvarium is unremarkable.  IMPRESSION: No evidence of acute intracranial abnormality.  Unchanged atrophy and chronic small vessel white matter ischemic changes.  Original Report Authenticated By: Rosendo Gros, M.D.    EKG: Independently reviewed. Normal Sinus Rhythm No acute ST changes, Low Voltage seen  Assessment: Principal Problem:  *Renal failure (ARF), acute on chronic Active Problems:  Generalized weakness  UTI (lower urinary tract infection)  HYPOTHYROIDISM  DIABETES  MELLITUS, TYPE II  HYPERLIPIDEMIA  ANEMIA-NOS  CIGARETTE SMOKER  HYPERTENSION  COPD   Plan:    Admit to MED/Surg Bed. Gentle Rehydration, Monitor Bun/Cr, May need Renal Consultation Urine C+S sent, IV Rocephin initiated Check TSH, and Anemia panel Reconcile home Medications SSI Coverage Tobacco Counseling done. DVT prophylaxis May Need Physical therapy evaluation.    Code Status:  FULL CODE Family Communication: N/A Disposition Plan: Return Home  Time spent: 60 minutes  Ron Parker [facility] Pager [phone]  If 7PM-7AM, please contact night-coverage [email or web address] Password [email or web address] 03/09/2012, 1:02 AM

## 2012-03-09 NOTE — ED Notes (Signed)
Report given to floor nurse by Deloria Lair, flexible resources RN.

## 2012-03-09 NOTE — Progress Notes (Deleted)
PT Cancellation Note  Treatment cancelled today due to patient's refusal to participate.  Pt in pain, RN notified, and will give pain meds;  Will reattempt PT eval tomorrow;  Van Clines [facility] 03/09/2012, 4:43 PM

## 2012-03-09 NOTE — Consult Note (Signed)
Nephrology Service Initial Consult Note  Reason for Consult:Acute on Chronic Kidney Disease Referring Physician: Dr. Isidoro Donning  HPI: [name] [name] is an 71 y.o. female, history of DM2, COPD, HTN, HL, gout, hypothyroidism (s/p surgical removal of goiter), osteopenia, presenting with falls, consulted for acute on chronic renal failure.  The patient notes a 2-4 week history of generalized weakness, as well as several falls, the most recent of which occurred on the day of admission.  She notes no prodrome, dizziness, LH, flushing, or visual/auditory aura prior to the falls, but rather notes that "my legs just feel weak".  She believes she hit her head on one fall, but denies LOC, focal neuro findings, or headache.  The patient has a history of chronic kidney disease stage 5, document since at least 2008, followed by Dr. Lowell Guitar.  The patient notes prior discussions about dialysis, and was scheduled to obtain an AV fistula several months ago, but never underwent the procedure due to [place] about starting dialysis, noting "I just thought maybe I'd heal".  The patient notes that 1 week ago, at an office visit with Dr. Lowell Guitar, her creatinine was 4.1.  Today her creatinine is 5.08.  UA on admission is suggestive of a UTI, though the patient notes no dysuria, frequency, urgency, suprapubic tenderness.  The patient notes that she may not have been eating/drinking as much recently as she usually does, but isn't sure.  She notes no nausea, vomiting, abd pain, diarrhea, constipation.  She notes no other new medications or other changes in the last month.  Her last UTI was diagnosed 01/24/12 by Dr. Oliver Barre, treated with Keflex 500 mg qid x10 days.  She notes no recent NSAID usage.  Renal US on admission also showed evidence of a possible solid mass in the upper pole of the R kidney.  The patient notes no hematuria, abd/flank pain, weight loss (acutally she notes weight gain recently, which she attributes to increased "social  eating" with a family member lately), night sweats.   PMH:   Past Medical History  Diagnosis Date  . HYPOTHYROIDISM 02/17/2007    s/p surgical removal of goiter in 1997  . DIABETES MELLITUS, TYPE II 02/17/2007  . HYPERLIPIDEMIA 02/17/2007  . GOUT 05/29/2007  . ANEMIA-NOS 05/29/2007  . ANXIETY 03/23/2010  . 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 DEPRESSION 02/17/2007  . RESTLESS LEG SYNDROME 05/29/2007  . COMMON MIGRAINE 05/29/2007  . PERIPHERAL NEUROPATHY 05/29/2007  . HYPERTENSION 02/17/2007  . PERIPHERAL VASCULAR DISEASE 02/17/2007  . ASTHMATIC BRONCHITIS, ACUTE 10/08/2008  . COPD 05/29/2007  . PEPTIC ULCER DISEASE 05/29/2007  . Chronic kidney disease 02/17/2007    Followed by Dr. Lowell Guitar  . OSTEOPENIA 09/22/2009  . SEIZURE DISORDER 02/17/2007  . Memory loss 01/24/2010  . Personal history of colonic polyps 11/16/2009  . Palpitations 09/08/2010  . Other diseases of vocal cords 09/19/2010  . HEART MURMUR, HX OF 10/02/2010  . 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 Chronic neck pain 12/13/2010    PSH:   Past Surgical History  Procedure Date  . Left toe amputated 2006  . Bunionectomy 1980  . Goiter removal 1997  . Stress cardiolite 06/18/2006  . Tranthoracic echocardiogram 06/18/2006  . Electrocardiogram 05/29/2006  . Cholecystectomy   . Rotator cuff repair Left    Dr. Lajoyce Corners    Allergies:  Allergies  Allergen Reactions  . Nsaids     REACTION: renal dysfunction  . Pioglitazone     Actos REACTION: EDEMA    Medications:  Medications Prior to Admission  Medication Sig Dispense Refill  . albuterol (PROVENTIL HFA;VENTOLIN HFA) 108 (90 BASE) MCG/ACT inhaler Inhale 1 puff into the lungs every 4 (four) hours as needed for wheezing or shortness of breath.  1 Inhaler  0  . amLODipine (NORVASC) 5 MG tablet Take 1 tablet (5 mg total) by mouth daily.  90 tablet  3  . atorvastatin (LIPITOR) 40 MG tablet Take 1 tablet (40 mg total) by mouth daily.  30 tablet  11  . clonazePAM (KLONOPIN) 2 MG tablet TAKE ONE  TABLET BY MOUTH TWICE DAILY AS NEEDED FOR ANXIETY  60 tablet  5  . cyclobenzaprine (FLEXERIL) 5 MG tablet Take 5 mg by mouth 3 (three) times daily as needed. For muscle spasms      . DULoxetine (CYMBALTA) 60 MG capsule Take 60 mg by mouth daily.      . ferrous sulfate 325 (65 FE) MG tablet 1 tab by mouth twice per day  30 tablet  11  . fluticasone (FLONASE) 50 MCG/ACT nasal spray Place 2 sprays into the nose daily.        . furosemide (LASIX) 80 MG tablet Take 1 tablet (80 mg total) by mouth 2 (two) times daily.      Marland Kitchen glimepiride (AMARYL) 1 MG tablet Take 1 mg by mouth every morning before breakfast. 1 po every day       . hydrOXYzine (ATARAX/VISTARIL) 25 MG tablet Take 25 mg by mouth every 6 (six) hours as needed. For anxiety      . levothyroxine (SYNTHROID, LEVOTHROID) 112 MCG tablet Take 112 mcg by mouth daily.      . mometasone-formoterol (DULERA) 100-5 MCG/ACT AERO Inhale 2 puffs into the lungs 2 (two) times daily.  1 Inhaler  6  . montelukast (SINGULAIR) 10 MG tablet Take 1 tablet (10 mg total) by mouth daily.  30 tablet  11  . omeprazole (PRILOSEC) 40 MG capsule Take 1 capsule (40 mg total) by mouth daily.  30 capsule  11  . pregabalin (LYRICA) 100 MG capsule Take 200 mg by mouth daily.      . promethazine (PHENERGAN) 25 MG tablet Take 25 mg by mouth every 8 (eight) hours as needed. For nausea      . traZODone (DESYREL) 100 MG tablet Take 0.5 tablets (50 mg total) by mouth at bedtime as needed. For sleep        Scheduled Meds:    . amLODipine  5 mg Oral Daily  . atorvastatin  40 mg Oral Daily  . budesonide-formoterol  2 puff Inhalation BID  . cefTRIAXone (ROCEPHIN)  IV  1 g Intravenous Once  . cefTRIAXone (ROCEPHIN)  IV  1 g Intravenous Q24H  . DULoxetine  60 mg Oral Daily  . enoxaparin (LOVENOX) injection  40 mg Subcutaneous Q24H  . ferrous sulfate  325 mg Oral BID WC  . fluticasone  2 spray Each Nare Daily  . glimepiride  1 mg Oral QAC breakfast  . insulin aspart  0-5 Units  Subcutaneous QHS  . insulin aspart  0-9 Units Subcutaneous TID WC  . levothyroxine  112 mcg Oral Q0600  . montelukast  10 mg Oral Daily  . nicotine  21 mg Transdermal Daily  . pantoprazole  40 mg Oral Q1200  . pregabalin  200 mg Oral Daily  . sodium chloride  1,000 mL Intravenous Once  . DISCONTD: albuterol  2.5 mg Nebulization Q6H  . DISCONTD: budesonide-formoterol  2 puff Inhalation BID  .  DISCONTD: ipratropium  0.5 mg Nebulization Q6H  . DISCONTD: mometasone-formoterol  2 puff Inhalation BID   Continuous Infusions:    . sodium chloride 100 mL/hr at 03/09/12 0811  . DISCONTD: dextrose 5 % and 0.45% NaCl 1,000 mL (03/09/12 0056)   PRN Meds:.acetaminophen, acetaminophen, albuterol, albuterol, alum & mag hydroxide-simeth, clonazePAM, cyclobenzaprine, HYDROmorphone (DILAUDID) injection, hydrOXYzine, ondansetron (ZOFRAN) IV, ondansetron, oxyCODONE, promethazine, traZODone, DISCONTD: albuterol, DISCONTD: zolpidem    Family History:   Family History  Problem Relation Age of Onset  . Dementia Mother   . Coronary artery disease Other   . Hyperlipidemia Other   . Hypertension Other   . Ovarian cancer Other   . Stroke Other   No family history of Renal Cancer.  Social History:  reports that she has been smoking Cigarettes.  She has a 94 pack-year smoking history. She uses smokeless tobacco. She reports that she does not drink alcohol or use illicit drugs. ROS: General: no fevers, chills, changes in appetite Skin: no rash HEENT: no blurry vision, hearing changes, sore throat Pulm: +mild cough for the last few days CV: no chest pain, palpitations, shortness of breath Abd: no abdominal pain, nausea/vomiting, diarrhea/constipation GU: no dysuria, hematuria, polyuria Ext: no arthralgias, myalgias Neuro: no weakness, numbness, or tingling  Blood pressure 115/61, pulse 83, temperature 97.7 F (36.5 C), temperature source Oral, resp. rate 17, height 5\' 7"  (1.702 m), weight 177 lb 4 oz  (80.4 kg), SpO2 96.00%.  PEX General: alert, cooperative, and in no apparent distress HEENT: pupils equal round and reactive to light, vision grossly intact, oropharynx clear and non-erythematous  Neck: supple, no lymphadenopathy, no JVD Lungs: mild ronchi in R lung base, normal work of respiration, no wheezes, rales, ronchi Heart: regular rate and rhythm, no murmurs, gallops, or rubs Abdomen: soft, non-tender, non-distended, normal bowel sounds Extremities: no edema, DP/PT pulses 2+ bilatearlly Neurologic: alert & oriented X3, cranial nerves II-XII intact, strength grossly intact, sensation intact to light touch  Creatinine, Ser  Date/Time Value Range Status  03/09/2012  5:00 AM 5.08* 0.50 - 1.10 mg/dL Final  08/11/1094  0:45 PM 5.40* 0.50 - 1.10 mg/dL Final  11/12/8117  1:47 PM 4.7* 0.4 - 1.2 mg/dL Final  04/03/5620  3:08 AM 4.04* 0.50 - 1.10 mg/dL Final  6/57/8469  6:29 AM 4.48* 0.50 - 1.10 mg/dL Final  01/01/4131  4:40 PM 4.85* 0.50 - 1.10 mg/dL Final  08/08/7251  6:64 AM 4.94* 0.50 - 1.10 mg/dL Final  11/06/4740  5:95 AM 3.23* 0.50 - 1.10 mg/dL Final  6/38/7564  3:32 AM 3.61* 0.50 - 1.10 mg/dL Final  9/51/8841  6:60 AM 4.38* 0.50 - 1.10 mg/dL Final  02/03/1600  0:93 PM 4.95* 0.50 - 1.10 mg/dL Final  2/35/5732  2:02 PM 4.8* 0.4 - 1.2 mg/dL Final  5/42/7062  3:76 PM 3.21* 0.4 - 1.2 mg/dL Final  28/31/5176  1:60 PM 3.0* 0.4-1.2 mg/dL Final  7/37/1062 69:48 AM 3.5* 0.4-1.2 mg/dL Final  5/46/2703 50:09 PM 2.7* 0.4-1.2 mg/dL Final  10/12/1827 93:71 AM 2.38* 0.4 - 1.2 mg/dL Final  69/67/8938 10:17 PM 2.6* 0.4-1.2 mg/dL Final  51/09/5850  7:78 PM 3.5* 0.4-1.2 mg/dL Final  09/10/2351  6:14 PM 2.9* 0.4-1.2 mg/dL Final  4/31/5400  8:67 PM 3.6* 0.4-1.2 mg/dL Final  01/23/5092  2:67 AM 3.64*  Final  02/26/2007  4:00 PM 3.61*  Final  01/24/2007  3:20 AM 3.18*  Final  01/23/2007  8:06 AM 3.59*  Final  10/16/2006  3:23 PM 3.5* 0.4-1.2 mg/dL Final  Results for orders placed during the hospital encounter  of 03/08/12 (from the past 48 hour(s))  CBC     Status: Abnormal   Collection Time   03/08/12  8:28 PM      Component Value Range Comment   WBC 7.2  4.0 - 10.5 K/uL    RBC 3.39 (*) 3.87 - 5.11 MIL/uL    Hemoglobin 10.5 (*) 12.0 - 15.0 g/dL    HCT 16.1 (*) 09.6 - 46.0 %    MCV 91.7  78.0 - 100.0 fL    MCH 31.0  26.0 - 34.0 pg    MCHC 33.8  30.0 - 36.0 g/dL    RDW 04.5 (*) 40.9 - 15.5 %    Platelets 196  150 - 400 K/uL   CK     Status: Normal   Collection Time   03/08/12  8:28 PM      Component Value Range Comment   Total CK 84  7 - 177 U/L   POCT I-STAT TROPONIN I     Status: Normal   Collection Time   03/08/12  8:43 PM      Component Value Range Comment   Troponin i, poc 0.01  0.00 - 0.08 ng/mL    Comment 3            POCT I-STAT, CHEM 8     Status: Abnormal   Collection Time   03/08/12  8:45 PM      Component Value Range Comment   Sodium 140  135 - 145 mEq/L    Potassium 4.2  3.5 - 5.1 mEq/L    Chloride 107  96 - 112 mEq/L    BUN 63 (*) 6 - 23 mg/dL    Creatinine, Ser 8.11 (*) 0.50 - 1.10 mg/dL    Glucose, Bld 914 (*) 70 - 99 mg/dL    Calcium, Ion 7.82 (*) 1.13 - 1.30 mmol/L    TCO2 24  0 - 100 mmol/L    Hemoglobin 10.5 (*) 12.0 - 15.0 g/dL    HCT 95.6 (*) 21.3 - 46.0 %   URINALYSIS, ROUTINE W REFLEX MICROSCOPIC     Status: Abnormal   Collection Time   03/08/12 11:02 PM      Component Value Range Comment   Color, Urine YELLOW  YELLOW    APPearance CLOUDY (*) CLEAR    Specific Gravity, Urine 1.009  1.005 - 1.030    pH 7.0  5.0 - 8.0    Glucose, UA NEGATIVE  NEGATIVE mg/dL    Hgb urine dipstick SMALL (*) NEGATIVE    Bilirubin Urine NEGATIVE  NEGATIVE    Ketones, ur NEGATIVE  NEGATIVE mg/dL    Protein, ur 086 (*) NEGATIVE mg/dL    Urobilinogen, UA 0.2  0.0 - 1.0 mg/dL    Nitrite NEGATIVE  NEGATIVE    Leukocytes, UA LARGE (*) NEGATIVE   URINE MICROSCOPIC-ADD ON     Status: Abnormal   Collection Time   03/08/12 11:02 PM      Component Value Range Comment   Squamous  Epithelial / LPF RARE  RARE    WBC, UA 21-50  <3 WBC/hpf    RBC / HPF 3-6  <3 RBC/hpf    Bacteria, UA MANY (*) RARE   GLUCOSE, CAPILLARY     Status: Abnormal   Collection Time   03/09/12  2:02 AM      Component Value Range Comment   Glucose-Capillary 184 (*) 70 - 99 mg/dL  BASIC METABOLIC PANEL     Status: Abnormal   Collection Time   03/09/12  5:00 AM      Component Value Range Comment   Sodium 141  135 - 145 mEq/L    Potassium 4.0  3.5 - 5.1 mEq/L    Chloride 103  96 - 112 mEq/L    CO2 27  19 - 32 mEq/L    Glucose, Bld 149 (*) 70 - 99 mg/dL    BUN 63 (*) 6 - 23 mg/dL    Creatinine, Ser 1.61 (*) 0.50 - 1.10 mg/dL    Calcium 8.5  8.4 - 09.6 mg/dL    GFR calc non Af Amer 8 (*) >90 mL/min    GFR calc Af Amer 9 (*) >90 mL/min   CBC     Status: Abnormal   Collection Time   03/09/12  5:00 AM      Component Value Range Comment   WBC 5.4  4.0 - 10.5 K/uL    RBC 3.08 (*) 3.87 - 5.11 MIL/uL    Hemoglobin 9.6 (*) 12.0 - 15.0 g/dL    HCT 04.5 (*) 40.9 - 46.0 %    MCV 93.2  78.0 - 100.0 fL    MCH 31.2  26.0 - 34.0 pg    MCHC 33.4  30.0 - 36.0 g/dL    RDW 81.1 (*) 91.4 - 15.5 %    Platelets 199  150 - 400 K/uL   VITAMIN B12     Status: Normal   Collection Time   03/09/12  5:00 AM      Component Value Range Comment   Vitamin B-12 705  211 - 911 pg/mL   FOLATE     Status: Normal   Collection Time   03/09/12  5:00 AM      Component Value Range Comment   Folate 9.1     IRON AND TIBC     Status: Abnormal   Collection Time   03/09/12  5:00 AM      Component Value Range Comment   Iron 33 (*) 42 - 135 ug/dL    TIBC 782 (*) 956 - 213 ug/dL    Saturation Ratios 14 (*) 20 - 55 %    UIBC 196  125 - 400 ug/dL   FERRITIN     Status: Abnormal   Collection Time   03/09/12  5:00 AM      Component Value Range Comment   Ferritin 315 (*) 10 - 291 ng/mL   RETICULOCYTES     Status: Abnormal   Collection Time   03/09/12  5:00 AM      Component Value Range Comment   Retic Ct Pct 1.3  0.4 - 3.1 %    RBC.  3.08 (*) 3.87 - 5.11 MIL/uL    Retic Count, Manual 40.0  19.0 - 186.0 K/uL   TSH     Status: Normal   Collection Time   03/09/12  5:00 AM      Component Value Range Comment   TSH 0.351  0.350 - 4.500 uIU/mL   GLUCOSE, CAPILLARY     Status: Abnormal   Collection Time   03/09/12  7:33 AM      Component Value Range Comment   Glucose-Capillary 136 (*) 70 - 99 mg/dL     Dg Chest 2 View  0/03/6577  *RADIOLOGY REPORT*  Clinical Data: Weakness and pain.  CHEST - 2 VIEW  Comparison: 02/22/2013from [facility].  Findings: Shallow inspiration  with elevation of the left hemidiaphragm.  Heart size and pulmonary vascularity are normal. Focal linear scarring in the left midlung is stable since previous study.  Suggestion of atelectasis or vascular crowding in the right lung base.  No focal consolidation.  No blunting of costophrenic angles.  No pneumothorax.  Calcified and tortuous aorta.  Surgical clips in the base of the neck.  Degenerative changes in the spine and shoulders.  IMPRESSION: Elevation of left hemidiaphragm with linear scarring in the left lung.  Atelectasis versus vascular crowding in the right lung base. Stable appearance since previous study.  Original Report Authenticated By: Marlon Pel, M.D.   Ct Head Wo Contrast  03/08/2012  *RADIOLOGY REPORT*  Clinical Data: Fall with head injury and headache.  CT HEAD WITHOUT CONTRAST  Technique:  Contiguous axial images were obtained from the base of the skull through the vertex without contrast.  Comparison: 08/18/2011 MRI and 08/17/2011 CT.  Findings: Mild atrophy and chronic small vessel white matter ischemic changes again noted.  No acute intracranial abnormalities are identified, including mass lesion or mass effect, hydrocephalus, extra-axial fluid collection, midline shift, hemorrhage, or acute infarction.  The visualized bony calvarium is unremarkable.  IMPRESSION: No evidence of acute intracranial abnormality.  Unchanged atrophy and  chronic small vessel white matter ischemic changes.  Original Report Authenticated By: Rosendo Gros, M.D.   US Renal  03/09/2012  *RADIOLOGY REPORT*  Clinical Data: Acute renal failure.  History of chronic kidney disease.  Rule out obstruction.  RENAL/URINARY TRACT ULTRASOUND COMPLETE  Comparison:  Abdominal ultrasound 05/21/2005.  Findings:  Right Kidney:  In the upper pole of the right kidney there is a 3.5 x 3.7 x 3.9 cm lesion of heterogeneous echogenicity which is concerning for a solid mass.  This appears have some internal blood flow on color Doppler imaging.  The renal parenchyma is diffusely echogenic.  There is also a small 1.1 x 0.9 by 1.0 cm anechoic lesion in the upper pole of the right kidney with some associated through transmission, likely representing a small cyst.  No hydronephrosis.  9.1 cm in length.  Left Kidney:  A 1.4 x 1.5 x 1.3 cm anechoic lesion with associated increase through transmission in the interpolar region.  The kidney is poorly visualized and is somewhat echogenic in appearance.  No hydronephrosis.  9.4 cm in length.  Bladder:  Urinary bladder is moderately distended without focal wall abnormalities.  IMPRESSION: 1.  3.5 x 3.7 x 3.9 cm indeterminate lesion in the upper pole of the right kidney is concerning for a renal mass.  Further evaluation with contrast enhanced MRI is recommended for more definitive characterization after resolution of the patient's acute renal failure. 2.  Additional small cysts in the kidneys bilaterally, as above. 3.  Diffusely echogenic renal parenchyma bilaterally, suggestive of medical renal disease.  Original Report Authenticated By: Florencia Reasons, M.D.    Assessment/Plan:  The patient is a 71 yo woman, history of CKD stage 5 (followed by Dr. Lowell Guitar, baseline Cr ~ 4.0) with no AV fistula, COPD, DM2, HL, HTN, hypothyroidism, osteopenia, presenting with weakness and falls, found to have ARF with UA evidence of UTI, also with a new right  upper pole kidney mass.  # Acute on Chronic Kidney Disease - history of CKD stage 5, baseline Cr ~ 4-4.8, presenting with Cr = 5.08.  Per patient report, Cr has increased by about 1 in about 1 week.  Etiology is likely prerenal due to decreased PO  intake in the setting of a UTI.  She notes no symptoms of urinary obstruction.  Perhaps more important is discussing with the patient the likely impending need for dialysis, and the possibility of perhaps placing an AV fistula during this admission if the patient is amenable to outpatient dialysis.  Likely no need for acute HD during this admission. -order Mg, Phos, LFT's -continue NS at 100 cc/hr -ceftriaxone for UTI, culture pending -follow daily renal function panel -consult vascular surgery if patient agrees to AV fistula  # Right Renal Lesion - 3.5x3.7x3.9 cm lesion noted on the upper pole of the right kidney, concerning for mass.  Patient noted to have anemia, but notes no hematuria, flank pain, palpable mass on examination, weight loss, or night sweats.   MRI with contrast would likely better visualize the lesion, but I'm concerned that even when patient's Cr improves to baseline, additional contrast could significantly damage her kidneys. -MRI abdomen WITHOUT contrast to evaluate renal mass  # Anemia of Chronic Disease - the patient has a history of anemia, likely secondary to anemia of chronic disease.  However, hemoglobin after IV fluids today is 9.6, down from a prior baseline of 11-12 in Jan 2013.  She is currently on home iron supplementation with ferrous sulfate BID. -check anemia panel  # Diabetes Mellitus - last A1C = 7.3 (08/21/11) -per primary team  # COPD - patient notes cough and subjective "wheezing" -per primary team, scheduled nebs, symbicort, ceftriaxone  # Weakness - unclear etiology, possibly UTI vs deconditioning vs ?cancer -per primary team, PT eval  # Hypothyroidism - chronic, stable  # Hyperlipidemia -per primary  team, continue statin  SignedJanalyn Harder, PGY2 Pgr. [phone] 03/09/2012, 1:01 PM   Patient seen and examined and agree with assessment and plan as above. 71 yo female with known CKD admitted with weakness, falls and creatinine 5.08 (up from 4.0 about a week ago per pt).  Agree with holding lasix, giving some fluids. She has unsteady gait, but denies any GI symptoms and looks deconditioned. Don't think we can say for sure that uremia is contributing to her falls, may be just deconditioning. On the other hand, her estimated creat clearance is low, less than 15 mL/min, and I suspect dialysis is in her near future.  She has been reluctant to have access established and has missed appt's apparently that have been made for her in this regards.  For now would not do any other work-up and would just give some fluids and address the gait dysfunction. She does have a large renal solid mass on [place] and we have ordered MRI for further investigation.  Other than that, patient doesn't require hospitalization at this point for renal issues and could be discharged once gait problems are addressed.  Vinson Moselle  MD [facility] [phone] pgr    [phone] cell 03/09/2012, 5:36 PM

## 2012-03-10 DIAGNOSIS — J209 Acute bronchitis, unspecified: Secondary | ICD-10-CM

## 2012-03-10 DIAGNOSIS — N186 End stage renal disease: Secondary | ICD-10-CM

## 2012-03-10 DIAGNOSIS — N19 Unspecified kidney failure: Secondary | ICD-10-CM

## 2012-03-10 DIAGNOSIS — K219 Gastro-esophageal reflux disease without esophagitis: Secondary | ICD-10-CM

## 2012-03-10 DIAGNOSIS — J449 Chronic obstructive pulmonary disease, unspecified: Secondary | ICD-10-CM

## 2012-03-10 DIAGNOSIS — J309 Allergic rhinitis, unspecified: Secondary | ICD-10-CM

## 2012-03-10 LAB — GLUCOSE, CAPILLARY
Glucose-Capillary: 113 mg/dL — ABNORMAL HIGH (ref 70–99)
Glucose-Capillary: 92 mg/dL (ref 70–99)
Glucose-Capillary: 76 mg/dL (ref 70–99)
Glucose-Capillary: 79 mg/dL (ref 70–99)
Glucose-Capillary: 82 mg/dL (ref 70–99)

## 2012-03-10 LAB — CBC
HCT: 29.5 % — ABNORMAL LOW (ref 36.0–46.0)
RDW: 15.8 % — ABNORMAL HIGH (ref 11.5–15.5)
WBC: 6 10*3/uL (ref 4.0–10.5)

## 2012-03-10 LAB — URINE CULTURE

## 2012-03-10 LAB — RENAL FUNCTION PANEL
Albumin: 3 g/dL — ABNORMAL LOW (ref 3.5–5.2)
BUN: 57 mg/dL — ABNORMAL HIGH (ref 6–23)
Chloride: 105 mEq/L (ref 96–112)
GFR calc Af Amer: 11 mL/min — ABNORMAL LOW (ref >90)
Glucose, Bld: 77 mg/dL (ref 70–99)
Potassium: 4.4 mEq/L (ref 3.5–5.1)

## 2012-03-10 MED ORDER — FERUMOXYTOL INJECTION 510 MG/17 ML
510.0000 mg | INTRAVENOUS | Status: AC
  Administered 2012-03-10 – 2012-03-13 (×2): 510 mg via INTRAVENOUS
  Filled 2012-03-10 (×3): qty 17

## 2012-03-10 MED ORDER — CALCIUM CARBONATE 1250 MG/5ML PO SUSP: 500.0000 mg | Freq: Four times a day (QID) | ORAL | Status: DC | PRN

## 2012-03-10 NOTE — Progress Notes (Addendum)
Patient ID: [name] [name]  female  ZOX:[id number]    DOB: 1941-02-11    DOA: 03/08/2012  PCP: Oliver Barre, MD  Subjective: Patient seen and examined, denies any chest pain/tightness, fevers or chills, abdominal pain. Somewhat depressed regarding the right renal mass found on MRI, wants to Dr. Lowell Guitar to talk to her, apprehensive of being on dialysis in future.  Objective: Weight change: 0.1 kg (3.5 oz)  Intake/Output Summary (Last 24 hours) at 03/10/12 1529 Last data filed at 03/10/12 1300  Gross per 24 hour  Intake 3368.34 ml  Output   1150 ml  Net 2218.34 ml   Blood pressure 136/65, pulse 87, temperature 98.4 F (36.9 C), temperature source Oral, resp. rate 20, height 5\' 7"  (1.702 m), weight 80.5 kg (177 lb 7.5 oz), SpO2 100.00%.  Physical Exam: General: Alert and awake, oriented x3, not in any acute distress. Somewhat anxious today HEENT: anicteric sclera, pupils reactive to light and accommodation, EOMI CVS: S1-S2 clear, no murmur rubs or gallops Chest: Clear to auscultation bilaterally  Abdomen: soft nontender, nondistended, normal bowel sounds, no organomegaly Extremities: no cyanosis, clubbing or edema noted bilaterally Neuro: Cranial nerves II-XII intact, no focal neurological deficits  Lab Results: Basic Metabolic Panel:  Lab 03/10/12 8119 03/09/12 1344 03/09/12 0500  NA 139 -- 141  K 4.4 -- 4.0  CL 105 -- 103  CO2 24 -- 27  GLUCOSE 77 -- 149*  BUN 57* -- 63*  CREATININE 4.42* -- 5.08*  CALCIUM 8.2* -- 8.5  MG -- 2.9* --  PHOS 5.2* -- --   CBC:  Lab 03/10/12 0510 03/09/12 0500  WBC 6.0 5.4  NEUTROABS -- --  HGB 9.9* 9.6*  HCT 29.5* 28.7*  MCV 93.7 93.2  PLT 207 199   Cardiac Enzymes:  Lab 03/08/12 2028  CKTOTAL 84  CKMB --  CKMBINDEX --  TROPONINI --   BNP: No components found with this basename: POCBNP:2 CBG:  Lab 03/10/12 1147 03/10/12 0737 03/09/12 2104 03/09/12 1654 03/09/12 1138  GLUCAP 76 92 113* 87 112*     Micro Results: Recent Results  (from the past 240 hour(s))  URINE CULTURE     Status: Normal   Collection Time   03/09/12  8:15 AM      Component Value Range Status Comment   Specimen Description URINE, CLEAN CATCH   Final    Special Requests NONE   Final    Culture  Setup Time 03/09/2012 16:24   Final    Colony Count 85,000 COLONIES/ML   Final    Culture     Final    Value: Multiple bacterial morphotypes present, none predominant. Suggest appropriate recollection if clinically indicated.   Report Status 03/10/2012 FINAL   Final     Studies/Results: Dg Chest 2 View  03/08/2012  *RADIOLOGY REPORT*  Clinical Data: Weakness and pain.  CHEST - 2 VIEW  Comparison: 02/22/2013from [facility].  Findings: Shallow inspiration with elevation of the left hemidiaphragm.  Heart size and pulmonary vascularity are normal. Focal linear scarring in the left midlung is stable since previous study.  Suggestion of atelectasis or vascular crowding in the right lung base.  No focal consolidation.  No blunting of costophrenic angles.  No pneumothorax.  Calcified and tortuous aorta.  Surgical clips in the base of the neck.  Degenerative changes in the spine and shoulders.  IMPRESSION: Elevation of left hemidiaphragm with linear scarring in the left lung.  Atelectasis versus vascular crowding in the right lung  base. Stable appearance since previous study.  Original Report Authenticated By: Marlon Pel, M.D.   Ct Head Wo Contrast  03/08/2012  *RADIOLOGY REPORT*  Clinical Data: Fall with head injury and headache.  CT HEAD WITHOUT CONTRAST  Technique:  Contiguous axial images were obtained from the base of the skull through the vertex without contrast.  Comparison: 08/18/2011 MRI and 08/17/2011 CT.  Findings: Mild atrophy and chronic small vessel white matter ischemic changes again noted.  No acute intracranial abnormalities are identified, including mass lesion or mass effect, hydrocephalus, extra-axial fluid collection, midline shift,  hemorrhage, or acute infarction.  The visualized bony calvarium is unremarkable.  IMPRESSION: No evidence of acute intracranial abnormality.  Unchanged atrophy and chronic small vessel white matter ischemic changes.  Original Report Authenticated By: Rosendo Gros, M.D.    Medications: Scheduled Meds:    . amLODipine  5 mg Oral Daily  . atorvastatin  40 mg Oral Daily  . budesonide-formoterol  2 puff Inhalation BID  . cefTRIAXone (ROCEPHIN)  IV  1 g Intravenous Q24H  . DULoxetine  60 mg Oral Daily  . enoxaparin (LOVENOX) injection  30 mg Subcutaneous Q24H  . ferrous sulfate  325 mg Oral BID WC  . ferumoxytol  510 mg Intravenous Q3 days  . fluticasone  2 spray Each Nare Daily  . glimepiride  1 mg Oral QAC breakfast  . insulin aspart  0-5 Units Subcutaneous QHS  . insulin aspart  0-9 Units Subcutaneous TID WC  . levothyroxine  112 mcg Oral Q0600  . montelukast  10 mg Oral Daily  . nicotine  21 mg Transdermal Daily  . pantoprazole  40 mg Oral Q1200  . pregabalin  200 mg Oral Daily   Continuous Infusions:    . sodium chloride 75 mL/hr at 03/10/12 1610     Assessment/Plan: Principal Problem:  *Renal failure (ARF), acute on chronic: Improving, baseline creatinine ~4, today 4.42 - Continue gentle hydration, possibly precipitated due to UTI, dehydration with progression of the chronic kidney disease - Renal ultrasound showed 3.5 X3 0.7x 3.9 cm renal mass on the right kidney, confirmed with the MRI abdomen without contrast - Renal service following, recommending continue hydration, patient is able to AV fistula placement   Right renal lesion: Newly diagnosed in the upper pole of right kidney concerning for tumor/mass - Urology consult called, we'll await recommendations, patient likely to need hemodialysis in the future with possible nephrectomy, she understands but is apprehensive.    Active Problems:  HYPOTHYROIDISM: Continue Synthroid, TSH 0.35    DIABETES MELLITUS, TYPE II:  Controlled inpatient, hemoglobin A1c 7.2 - Continue sliding scale insulin, Amaryl   HYPERLIPIDEMIA: Continue statins   ANEMIA-NOS: Likely anemia of chronic disease, per renal   CIGARETTE SMOKER - Counseled on smoking cessation   HYPERTENSION: Stable   Generalized weakness: - Increasing falls likely secondary  to UTI and acute renal insufficiency, will need home health PT    UTI (lower urinary tract infection): - Follow urine culture, continue Rocephin - Urine culture with 85,000 colonies   COPD bronchitis: Improved - Placed on scheduled nebs, Symbicort, continue Rocephin, O2 supplementation  DVT Prophylaxis: Lovenox  Code Status: Full  Disposition: Not medically ready   LOS: 2 days   Kirsti Mcalpine M.D. [facility] 03/10/2012, 3:29 PM Pager: [phone]  If 7PM-7AM, please contact night-coverage [email or web address] Password [email or web address]  Addendum: Spoke with the Dr. Margarita Grizzle with urology service on call, who recommended that patient followup in the office with Dr. Laverle Patter  in next 10-14 days (he will try to set up an appointment). Per Dr. Margarita Grizzle, they usually do not do laparoscopic surgery for the renal mass in-patient.   Elesia Pemberton M.D. [facility] 03/10/2012, 3:44 PM  Pager: [phone]

## 2012-03-10 NOTE — Consult Note (Signed)
Patient is seen in consultation for discussion of AV access for renal replacement. She has a long history of chronic renal insufficiency. She was admitted with a urinary tract infection and worsening renal function. This is now returning back toward her baseline. She was seen in our office by Dr. Edilia Bo in February 2013. At that time she had a vein map showing very small surface veins bilaterally. He is left-handed. She has a very poor understanding of her disease process and I again discussed options of hemodialysis catheter, AV graft and AV fistula. She is concern regarding the physical appearance of the access and scarring. She is also concerned regarding the incidence of clotting and maintenance of her access.  Past Medical History  Diagnosis Date  . HYPOTHYROIDISM 02/17/2007    s/p surgical removal of goiter in 1997  . DIABETES MELLITUS, TYPE II 02/17/2007  . HYPERLIPIDEMIA 02/17/2007  . GOUT 05/29/2007  . ANEMIA-NOS 05/29/2007  . ANXIETY 03/23/2010  . 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 DEPRESSION 02/17/2007  . RESTLESS LEG SYNDROME 05/29/2007  . COMMON MIGRAINE 05/29/2007  . PERIPHERAL NEUROPATHY 05/29/2007  . HYPERTENSION 02/17/2007  . PERIPHERAL VASCULAR DISEASE 02/17/2007  . ASTHMATIC BRONCHITIS, ACUTE 10/08/2008  . COPD 05/29/2007  . PEPTIC ULCER DISEASE 05/29/2007  . Chronic kidney disease 02/17/2007    Followed by Dr. Lowell Guitar  . OSTEOPENIA 09/22/2009  . SEIZURE DISORDER 02/17/2007  . Memory loss 01/24/2010  . Personal history of colonic polyps 11/16/2009  . Palpitations 09/08/2010  . Other diseases of vocal cords 09/19/2010  . HEART MURMUR, HX OF 10/02/2010  . 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 Chronic neck pain 12/13/2010    History  Substance Use Topics  . Smoking status: Current Some Day Smoker -- 2.0 packs/day for 47 years    Types: Cigarettes  . Smokeless tobacco: Current User   Comment: currently smoking only 4 to 5 cigs daily, mostly electronic cigarettes  . Alcohol Use: No    Family  History  Problem Relation Age of Onset  . Dementia Mother   . Coronary artery disease Other   . Hyperlipidemia Other   . Hypertension Other   . Ovarian cancer Other   . Stroke Other     Allergies  Allergen Reactions  . Nsaids     REACTION: renal dysfunction  . Pioglitazone     Actos REACTION: EDEMA    Current facility-administered medications:0.9 %  sodium chloride infusion, , Intravenous, Continuous, Ripudeep K Rai, MD, Last Rate: 75 mL/hr at 03/10/12 0322;  acetaminophen (TYLENOL) suppository 650 mg, 650 mg, Rectal, Q6H PRN, Ron Parker, MD;  acetaminophen (TYLENOL) tablet 650 mg, 650 mg, Oral, Q6H PRN, Ron Parker, MD albuterol (PROVENTIL HFA;VENTOLIN HFA) 108 (90 BASE) MCG/ACT inhaler 2 puff, 2 puff, Inhalation, Q4H PRN, Ripudeep K Rai, MD;  albuterol (PROVENTIL) (5 MG/ML) 0.5% nebulizer solution 2.5 mg, 2.5 mg, Nebulization, Q2H PRN, Ripudeep K Rai, MD;  alum & mag hydroxide-simeth (MAALOX/MYLANTA) 200-200-20 MG/5ML suspension 30 mL, 30 mL, Oral, Q6H PRN, Ron Parker, MD amLODipine (NORVASC) tablet 5 mg, 5 mg, Oral, Daily, Ron Parker, MD, 5 mg at 03/10/12 0930;  atorvastatin (LIPITOR) tablet 40 mg, 40 mg, Oral, Daily, Ron Parker, MD, 40 mg at 03/10/12 0931;  budesonide-formoterol (SYMBICORT) 80-4.5 MCG/ACT inhaler 2 puff, 2 puff, Inhalation, BID, Ripudeep K Rai, MD, 2 puff at 03/10/12 0702 cefTRIAXone (ROCEPHIN) 1 g in dextrose 5 % 50 mL IVPB, 1 g, Intravenous, Q24H, Ron Parker, MD, 1 g at 03/09/12 2144;  clonazePAM (KLONOPIN) tablet 2 mg, 2 mg, Oral, BID PRN, Ron Parker, MD, 2 mg at 03/09/12 0950;  cyclobenzaprine (FLEXERIL) tablet 5 mg, 5 mg, Oral, TID PRN, Ron Parker, MD, 5 mg at 03/09/12 2144;  DULoxetine (CYMBALTA) DR capsule 60 mg, 60 mg, Oral, Daily, Ron Parker, MD, 60 mg at 03/10/12 0930 enoxaparin (LOVENOX) injection 30 mg, 30 mg, Subcutaneous, Q24H, Linward Headland, MD, 30 mg at 03/10/12 1145;  ferrous sulfate  tablet 325 mg, 325 mg, Oral, BID WC, Ron Parker, MD, 325 mg at 03/10/12 1610;  ferumoxytol [facility]) injection 510 mg, 510 mg, Intravenous, Q3 days, Dyke Maes, MD;  fluticasone [facility]) 50 MCG/ACT nasal spray 2 spray, 2 spray, Each Nare, Daily, Ron Parker, MD, 2 spray at 03/10/12 0930 glimepiride (AMARYL) tablet 1 mg, 1 mg, Oral, QAC breakfast, Ron Parker, MD, 1 mg at 03/10/12 9604;  HYDROmorphone (DILAUDID) injection 0.5-1 mg, 0.5-1 mg, Intravenous, Q3H PRN, Ron Parker, MD, 1 mg at 03/10/12 0448;  hydrOXYzine (ATARAX/VISTARIL) tablet 25 mg, 25 mg, Oral, Q6H PRN, Ron Parker, MD;  insulin aspart (novoLOG) injection 0-5 Units, 0-5 Units, Subcutaneous, QHS, Harvette Velora Heckler, MD insulin aspart (novoLOG) injection 0-9 Units, 0-9 Units, Subcutaneous, TID WC, Ron Parker, MD, 1 Units at 03/09/12 0808;  levothyroxine (SYNTHROID, LEVOTHROID) tablet 112 mcg, 112 mcg, Oral, Q0600, Ron Parker, MD, 112 mcg at 03/10/12 0549;  montelukast (SINGULAIR) tablet 10 mg, 10 mg, Oral, Daily, Ron Parker, MD, 10 mg at 03/10/12 0930 nicotine (NICODERM CQ - dosed in mg/24 hours) patch 21 mg, 21 mg, Transdermal, Daily, Ripudeep K Rai, MD, 21 mg at 03/10/12 0929;  ondansetron (ZOFRAN) injection 4 mg, 4 mg, Intravenous, Q6H PRN, Ron Parker, MD;  ondansetron (ZOFRAN) tablet 4 mg, 4 mg, Oral, Q6H PRN, Ron Parker, MD;  oxyCODONE (Oxy IR/ROXICODONE) immediate release tablet 5 mg, 5 mg, Oral, Q4H PRN, Ron Parker, MD, 5 mg at 03/09/12 2144 pantoprazole (PROTONIX) EC tablet 40 mg, 40 mg, Oral, Q1200, Ron Parker, MD, 40 mg at 03/09/12 0950;  pregabalin (LYRICA) capsule 200 mg, 200 mg, Oral, Daily, Ron Parker, MD, 100 mg at 03/10/12 0935;  promethazine (PHENERGAN) tablet 25 mg, 25 mg, Oral, Q8H PRN, Ron Parker, MD;  traZODone (DESYREL) tablet 50 mg, 50 mg, Oral, QHS PRN, Ron Parker, MD  BP 136/65  Pulse 87  Temp  98.4 F (36.9 C) (Oral)  Resp 20  Ht 5\' 7"  (1.702 m)  Wt 177 lb 7.5 oz (80.5 kg)  BMI 27.80 kg/m2  SpO2 100%  Body mass index is 27.80 kg/(m^2).       Physical exam: Alert oriented female in no acute distress Neurologically grossly intact Respirations nonlabored Pulse status 2+ radial pulses bilaterally Skin without ulcers or rashes Surface veins are very small in both the wrist and antecubital space.  Vein mapping from February 2013 showed unusable cephalic veins bilaterally. The right basilic vein was small but patent.  Impression and plan: Chronic renal insufficiency admitted with worsening renal function. This is now improving with hydration. Feel that she is a candidate for right arm AV graft placement and will "save" her right arm. Will defer to the renal service as to timing for AV graft placement. This could be placed as an inpatient or in the future as an outpatient. Will follow with you

## 2012-03-10 NOTE — Progress Notes (Signed)
Entered patient room at 1805 to find the patient on the floor.  Patient stated, " I dropped my pad on the floor.  I bent over to pick it up and sat in the floor.  I didn't fall."  Patient endorses discomfort in her left shoulder, but states this was present prior to the fall.  Patient denies any other pain, tenderness or swelling.  Dr. Isidoro Donning was notified at 1830.  MD advised to keep patient bed alarm on.  Prior to event, patient was admitted with falls and endorsed weakness of both extremities.  After the event, patient had no acute changes.  Patient endorsed the need to call for assistance and that she did not do so in this circumstance.  Patient's vitals immediately after fall were B/P 144/69, HR 77, RR 18, O2 sat 98% on room air.  Post-fall assessment was done.  Patient deferred notification of fall to next of kin.  Patient is alert and oriented to person, place and time after the fall.

## 2012-03-10 NOTE — Care Management Note (Signed)
    Page 1 of 1   03/10/2012     3:50:04 PM   CARE MANAGEMENT NOTE 03/10/2012  Patient:  [name] [name], [name] [name]   Account Number:  [id number]  Date Initiated:  03/10/2012  Documentation initiated by:  Donn Pierini  Subjective/Objective Assessment:   Pt admitted with ARF, falls     Action/Plan:   PTA pt lived at home alone,   Anticipated DC Date:  03/14/2012   Anticipated DC Plan:  HOME W HOME HEALTH SERVICES         [facility]  HOME HEALTH   Choice offered to / List presented to:  C-1 Patient           HH agency  [facility]   Status of service:  In process, will continue to follow Medicare Important Message given?   (If response is "NO", the following Medicare IM given date fields will be blank) Date Medicare IM given:   Date Additional Medicare IM given:    Discharge Disposition:    Per UR Regulation:  Reviewed for med. necessity/level of care/duration of stay  If discussed at Long Length of Stay Meetings, dates discussed:    Comments:  PCP- Oliver Barre  03/10/12- 1545- Donn Pierini RN BSN [phone] Spoke with pt and girlfriend (with permission) at bedside- per conversation pt states that she has had HH services in the past with [facility]- and if any HH services needed pt would like to use [facility] again. Pt reports that she has cane and RW at home already- she has medication coverage and transportation home. Per PT notes recommending HH-PT at discharge- MD please place order for [facility] - will make referral to [facility] per pt choice when order placed

## 2012-03-10 NOTE — Progress Notes (Signed)
Utilization Review Completed.[name] [name] T8/12/2011   

## 2012-03-10 NOTE — Consult Note (Signed)
Urology Consult  CC: Right renal mass  HPI: 71 year old female with chronic renal insufficiency admitted for possible UTI and acute on chronic renal failure. During work up she was found to have a right upper pole exophytic renal mass. Due to renal issues, she could not have a contrasted study, so she had an MRI without contrast. This revealed the mass to be 3.6 x 3.3 x 3.1 cm.  There was possible associated internal hemorrhage. There were additional bilateral renal cysts. I explained to her that there is a high chance that this is a cancer of the kidney.  This has not been associated with flank pain or gross hematuria.  We discussed the natural history of renal masses and renal cancer. I explained that if it were a cancer, we start to see metastasis between 3-4 cm.  We discussed management options along with their risks & benefits. These include observation, percutaneous biopsy, percutaneous thermal ablation, surgical excision. I explained that any type of intervention would likely worsen her renal function and could push her into needing dialysis.  She has had staging with negative LFT's, negative chest x-ray, and abdominal MRI.  PMH: Past Medical History  Diagnosis Date  . HYPOTHYROIDISM 02/17/2007    s/p surgical removal of goiter in 1997  . DIABETES MELLITUS, TYPE II 02/17/2007  . HYPERLIPIDEMIA 02/17/2007  . GOUT 05/29/2007  . ANEMIA-NOS 05/29/2007  . ANXIETY 03/23/2010  . 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 DEPRESSION 02/17/2007  . RESTLESS LEG SYNDROME 05/29/2007  . COMMON MIGRAINE 05/29/2007  . PERIPHERAL NEUROPATHY 05/29/2007  . HYPERTENSION 02/17/2007  . PERIPHERAL VASCULAR DISEASE 02/17/2007  . ASTHMATIC BRONCHITIS, ACUTE 10/08/2008  . COPD 05/29/2007  . PEPTIC ULCER DISEASE 05/29/2007  . Chronic kidney disease 02/17/2007    Followed by Dr. Lowell Guitar  . OSTEOPENIA 09/22/2009  . SEIZURE DISORDER 02/17/2007  . Memory loss 01/24/2010  . Personal history of colonic polyps 11/16/2009  .  Palpitations 09/08/2010  . Other diseases of vocal cords 09/19/2010  . HEART MURMUR, HX OF 10/02/2010  . 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 Chronic neck pain 12/13/2010    PSH: Past Surgical History  Procedure Date  . Left toe amputated 2006  . Bunionectomy 1980  . Goiter removal 1997  . Stress cardiolite 06/18/2006  . Tranthoracic echocardiogram 06/18/2006  . Electrocardiogram 05/29/2006  . Cholecystectomy   . Rotator cuff repair Left    Dr. Lajoyce Corners    Allergies: Allergies  Allergen Reactions  . Nsaids     REACTION: renal dysfunction  . Pioglitazone     Actos REACTION: EDEMA    Medications: Prescriptions prior to admission  Medication Sig Dispense Refill  . albuterol (PROVENTIL HFA;VENTOLIN HFA) 108 (90 BASE) MCG/ACT inhaler Inhale 1 puff into the lungs every 4 (four) hours as needed for wheezing or shortness of breath.  1 Inhaler  0  . amLODipine (NORVASC) 5 MG tablet Take 1 tablet (5 mg total) by mouth daily.  90 tablet  3  . atorvastatin (LIPITOR) 40 MG tablet Take 1 tablet (40 mg total) by mouth daily.  30 tablet  11  . clonazePAM (KLONOPIN) 2 MG tablet TAKE ONE TABLET BY MOUTH TWICE DAILY AS NEEDED FOR ANXIETY  60 tablet  5  . cyclobenzaprine (FLEXERIL) 5 MG tablet Take 5 mg by mouth 3 (three) times daily as needed. For muscle spasms      . DULoxetine (CYMBALTA) 60 MG capsule Take 60 mg by mouth daily.      . ferrous sulfate  325 (65 FE) MG tablet 1 tab by mouth twice per day  30 tablet  11  . fluticasone (FLONASE) 50 MCG/ACT nasal spray Place 2 sprays into the nose daily.        . furosemide (LASIX) 80 MG tablet Take 1 tablet (80 mg total) by mouth 2 (two) times daily.      Marland Kitchen glimepiride (AMARYL) 1 MG tablet Take 1 mg by mouth every morning before breakfast. 1 po every day       . levothyroxine (SYNTHROID, LEVOTHROID) 112 MCG tablet Take 112 mcg by mouth daily.      . mometasone-formoterol (DULERA) 100-5 MCG/ACT AERO Inhale 2 puffs into the lungs 2 (two) times daily.  1 Inhaler  6    . montelukast (SINGULAIR) 10 MG tablet Take 1 tablet (10 mg total) by mouth daily.  30 tablet  11  . omeprazole (PRILOSEC) 40 MG capsule Take 1 capsule (40 mg total) by mouth daily.  30 capsule  11  . pregabalin (LYRICA) 100 MG capsule Take 200 mg by mouth daily.      . promethazine (PHENERGAN) 25 MG tablet Take 25 mg by mouth every 8 (eight) hours as needed. For nausea      . traZODone (DESYREL) 100 MG tablet Take 0.5 tablets (50 mg total) by mouth at bedtime as needed. For sleep         Social History: History   Social History  . Marital Status: Single    Spouse Name: N/A    Number of Children: N/A  . Years of Education: N/A   Occupational History  . disabled     c-spine and back pain   Social History Main Topics  . Smoking status: Current Some Day Smoker -- 2.0 packs/day for 47 years    Types: Cigarettes  . Smokeless tobacco: Current User   Comment: currently smoking only 4 to 5 cigs daily, mostly electronic cigarettes  . Alcohol Use: No  . Drug Use: No  . Sexually Active: Not on file   Other Topics Concern  . Not on file   Social History Narrative   Currently on disability for her back pain.  Lives alone, though has been thinking about moving to ALF/SNF recently.    Family History: Family History  Problem Relation Age of Onset  . Dementia Mother   . Coronary artery disease Other   . Hyperlipidemia Other   . Hypertension Other   . Ovarian cancer Other   . Stroke Other     Review of Systems: Positive: BLE weakness, dysuria. Negative: Chest pain, diarrhea, fever.  A further 10 point review of systems was negative except what is listed in the HPI.  Physical Exam:  General: No acute distress.  Awake. Head:  Normocephalic.  Atraumatic. ENT:  EOMI.  Mucous membranes moist Neck:  Supple.  No lymphadenopathy. CV:  S1 present. S2 present. Regular rate. Pulmonary: Equal effort bilaterally.  Clear to auscultation bilaterally. Abdomen: Soft.  Non- tender to  palpation. Skin:  Normal turgor.  No visible rash. Extremity: No gross deformity of bilateral upper extremities.  No gross deformity of    bilateral lower extremities. Neurologic: Alert. Appropriate mood.    Studies:  Recent Labs  Basename 03/10/12 0510 03/09/12 0500   HGB 9.9* 9.6*   WBC 6.0 5.4   PLT 207 199    Recent Labs  Basename 03/10/12 0510 03/09/12 0500   NA 139 141   K 4.4 4.0   CL 105 103  CO2 24 27   BUN 57* 63*   CREATININE 4.42* 5.08*   CALCIUM 8.2* 8.5   GFRNONAA 9* 8*   GFRAA 11* 9*     No results found for this basename: PT:2,INR:2,APTT:2 in the last 72 hours   No components found with this basename: ABG:2    Assessment:  Right renal mass  Plan: -I would recommend a percutaneous biopsy of this mass. Lack of use of contrast for imaging drops the certainty that this is a cancerous mass. Given her comorbidities, surgery would be very difficult on her, and even percutaneous thermal therapy could push her to dialysis.  I feel that a biopsy could change our management (if it was positive, then proceeding with intervention would be needed). We discussed the risks, benefits, side effects, and short-comings of biopsy.  -She would like to discuss this with Dr. Lowell Guitar.  -If she decides to move forward with biopsy, it may be able to be accomplished during this hospitalization.  -Will add LDH to her morning labs to complete staging.  -Will follow.  Pager: [phone]

## 2012-03-10 NOTE — Progress Notes (Signed)
HOME HEALTH AGENCIES SERVING [place]   Agencies that are Medicare-Certified and are affiliated with The [facility]  Telephone Number Address  [facility]   The [facility] has ownership interest in this company; however, you are under no obligation to use this agency. [phone] or  [phone] [address] [place], [place] [address]   Agencies that are Medicare-Certified and are not affiliated with [facility]                                                                                 Home Health Agency Telephone Number Address  [facility] [phone] Fax [phone] [address], [address] [place], [place]  [address]  [facility] [phone] or [phone] Fax [phone] [address] [place], [place] [address]  [facility] [phone] Fax [phone] [address] [place], [place] [address]  [facility] [phone] Fax [phone] [address], [address] [place], [place]  [address]  [facility] [phone] Fax [phone] [address], [address] [place], [place] [address]  [facility] [phone] [address] [place], [place] [address]  [facility] [phone]  [address] [place], [place] [address]  [facility] [phone] or [phone] Fax [phone] [address] [place], [place]  [address]  [facility] [phone] Fax [phone] [address] [place], [place]  [address]  [facility]  [phone] Fax [phone] [address] [place], [place] [address]               Agencies that are not Medicare-Certified and are not affiliated with [facility]   Home Health Agency Telephone Number Address  [facility] [phone] or [phone] Fax [phone] [address] [place], [place]  [address]  [facility] [phone] Fax [phone] [address] [place], [place]  [address]  [facility]  [phone] Fax [phone] [address] [place], [place] [address]  [facility] [phone] Fax [phone] [address] , [place] [address]  [facility] [phone] or [phone] Fax [phone] [address] [place], [place]  [address]  [facility] [phone] or [phone] Fax [phone] [address] [place], [place]  [address]  [facility] [phone] Fax [phone] [address] [place], [place]  [address]  [facility] [phone] [address] [place], [place]  [address]  [facility] [phone] Fax [phone] [address] [place], [place]  [address]  [facility] [phone] Fax [phone] [address] [place], [place]  [address]  [facility] [phone] Fax [phone] [address] [place], [place] [address]  [facility] [phone] Fax [phone] [address] ,   [address]   

## 2012-03-10 NOTE — Progress Notes (Signed)
Nephrology Service Daily Progress Note  Subjective:  The patient notes continued weakness overnight.  She worked with PT yesterday, who recommended HH PT.  Urine Output has been good overnight, and creatinine continues to fall towards her baseline.  After a long discussion yesterday, the patient has agreed to AV fistula placement.  Objective Vital signs in last 24 hours: Filed Vitals:   03/09/12 2024 03/09/12 2101 03/10/12 0539 03/10/12 0703  BP:  123/67 115/71   Pulse:  77 72   Temp:  98.6 F (37 C) 98.6 F (37 C)   TempSrc:  Oral Oral   Resp:  16 18   Height:      Weight:  177 lb 7.5 oz (80.5 kg)    SpO2: 86% 90% 92% 94%   Weight change: 3.5 oz (0.1 kg)  Intake/Output Summary (Last 24 hours) at 03/10/12 0751 Last data filed at 03/10/12 1610  Gross per 24 hour  Intake 3128.34 ml  Output   1150 ml  Net 1978.34 ml  UOP: 1150 mL Labs: Basic Metabolic Panel:  Lab 03/10/12 9604 03/09/12 1344 03/09/12 0500 03/08/12 2045  NA 139 -- 141 140  K 4.4 -- 4.0 4.2  CL 105 -- 103 107  CO2 24 -- 27 --  GLUCOSE 77 -- 149* 104*  BUN 57* -- 63* 63*  CREATININE 4.42* -- 5.08* 5.40*  ALB -- -- -- --  CALCIUM 8.2* -- 8.5 --  PHOS 5.2* 4.9* -- --   Liver Function Tests:  Lab 03/10/12 0510 03/09/12 1344  AST -- 14  ALT -- 9  ALKPHOS -- 66  BILITOT -- 0.1*  PROT -- 6.8  ALBUMIN 3.0* 3.3*   CBC:  Lab 03/10/12 0510 03/09/12 0500 03/08/12 2045 03/08/12 2028  WBC 6.0 5.4 -- 7.2  NEUTROABS -- -- -- --  HGB 9.9* 9.6* 10.5* 10.5*  HCT 29.5* 28.7* 31.0* 31.1*  MCV 93.7 93.2 -- 91.7  PLT 207 199 -- 196   Cardiac Enzymes:  Lab 03/08/12 2028  CKTOTAL 84  CKMB --  CKMBINDEX --  TROPONINI --   CBG:  Lab 03/10/12 0737 03/09/12 2104 03/09/12 1654 03/09/12 1138 03/09/12 0733  GLUCAP 92 113* 87 112* 136*    Iron Studies:   Lab 03/09/12 0500  IRON 33*  TIBC 229*  TRANSFERRIN --  FERRITIN 315*  Saturation ratio: 14  Studies/Results: Dg Chest 2 View  03/08/2012  *RADIOLOGY  REPORT*  Clinical Data: Weakness and pain.  CHEST - 2 VIEW  Comparison: 02/22/2013from [facility].  Findings: Shallow inspiration with elevation of the left hemidiaphragm.  Heart size and pulmonary vascularity are normal. Focal linear scarring in the left midlung is stable since previous study.  Suggestion of atelectasis or vascular crowding in the right lung base.  No focal consolidation.  No blunting of costophrenic angles.  No pneumothorax.  Calcified and tortuous aorta.  Surgical clips in the base of the neck.  Degenerative changes in the spine and shoulders.  IMPRESSION: Elevation of left hemidiaphragm with linear scarring in the left lung.  Atelectasis versus vascular crowding in the right lung base. Stable appearance since previous study.  Original Report Authenticated By: Marlon Pel, M.D.   Ct Head Wo Contrast  03/08/2012  *RADIOLOGY REPORT*  Clinical Data: Fall with head injury and headache.  CT HEAD WITHOUT CONTRAST  Technique:  Contiguous axial images were obtained from the base of the skull through the vertex without contrast.  Comparison: 08/18/2011 MRI and 08/17/2011 CT.  Findings:  Mild atrophy and chronic small vessel white matter ischemic changes again noted.  No acute intracranial abnormalities are identified, including mass lesion or mass effect, hydrocephalus, extra-axial fluid collection, midline shift, hemorrhage, or acute infarction.  The visualized bony calvarium is unremarkable.  IMPRESSION: No evidence of acute intracranial abnormality.  Unchanged atrophy and chronic small vessel white matter ischemic changes.  Original Report Authenticated By: Rosendo Gros, M.D.   US Renal  03/09/2012  *RADIOLOGY REPORT*  Clinical Data: Acute renal failure.  History of chronic kidney disease.  Rule out obstruction.  RENAL/URINARY TRACT ULTRASOUND COMPLETE  Comparison:  Abdominal ultrasound 05/21/2005.  Findings:  Right Kidney:  In the upper pole of the right kidney there is a 3.5 x 3.7  x 3.9 cm lesion of heterogeneous echogenicity which is concerning for a solid mass.  This appears have some internal blood flow on color Doppler imaging.  The renal parenchyma is diffusely echogenic.  There is also a small 1.1 x 0.9 by 1.0 cm anechoic lesion in the upper pole of the right kidney with some associated through transmission, likely representing a small cyst.  No hydronephrosis.  9.1 cm in length.  Left Kidney:  A 1.4 x 1.5 x 1.3 cm anechoic lesion with associated increase through transmission in the interpolar region.  The kidney is poorly visualized and is somewhat echogenic in appearance.  No hydronephrosis.  9.4 cm in length.  Bladder:  Urinary bladder is moderately distended without focal wall abnormalities.  IMPRESSION: 1.  3.5 x 3.7 x 3.9 cm indeterminate lesion in the upper pole of the right kidney is concerning for a renal mass.  Further evaluation with contrast enhanced MRI is recommended for more definitive characterization after resolution of the patient's acute renal failure. 2.  Additional small cysts in the kidneys bilaterally, as above. 3.  Diffusely echogenic renal parenchyma bilaterally, suggestive of medical renal disease.  Original Report Authenticated By: Florencia Reasons, M.D.   Medications: Scheduled Meds:   . amLODipine  5 mg Oral Daily  . atorvastatin  40 mg Oral Daily  . budesonide-formoterol  2 puff Inhalation BID  . cefTRIAXone (ROCEPHIN)  IV  1 g Intravenous Q24H  . DULoxetine  60 mg Oral Daily  . enoxaparin (LOVENOX) injection  30 mg Subcutaneous Q24H  . ferrous sulfate  325 mg Oral BID WC  . fluticasone  2 spray Each Nare Daily  . glimepiride  1 mg Oral QAC breakfast  . insulin aspart  0-5 Units Subcutaneous QHS  . insulin aspart  0-9 Units Subcutaneous TID WC  . levothyroxine  112 mcg Oral Q0600  . montelukast  10 mg Oral Daily  . nicotine  21 mg Transdermal Daily  . pantoprazole  40 mg Oral Q1200  . pregabalin  200 mg Oral Daily  . DISCONTD:  albuterol  2.5 mg Nebulization Q6H  . DISCONTD: budesonide-formoterol  2 puff Inhalation BID  . DISCONTD: enoxaparin (LOVENOX) injection  40 mg Subcutaneous Q24H  . DISCONTD: ipratropium  0.5 mg Nebulization Q6H   Continuous Infusions:    . sodium chloride 100 mL/hr at 03/10/12 0322   PRN Meds:.acetaminophen, acetaminophen, albuterol, albuterol, alum & mag hydroxide-simeth, clonazePAM, cyclobenzaprine, HYDROmorphone (DILAUDID) injection, hydrOXYzine, ondansetron (ZOFRAN) IV, ondansetron, oxyCODONE, promethazine, traZODone  I  have reviewed scheduled and prn medications.  Physical Exam:   PEX General: alert, cooperative, and in no apparent distress HEENT: pupils equal round and reactive to light, vision grossly intact, oropharynx clear and non-erythematous  Neck: supple, no lymphadenopathy,  no JVD Lungs: mild ronchi in R lung base, normal work of respiration, no wheezes, rales, ronchi Heart: regular rate and rhythm, no murmurs, gallops, or rubs Abdomen: soft, non-tender, non-distended, normal bowel sounds  Extremities: no edema, DP/PT pulses 2+ bilatearlly Neurologic: alert & oriented X3, cranial nerves II-XII intact, strength grossly intact, sensation intact to light touch  Assessment/Plan:  The patient is a 70 yo woman, history of CKD stage 5 (followed by Dr. Lowell Guitar, baseline Cr ~ 4.0) with no AV fistula, COPD, DM2, HL, HTN, hypothyroidism, osteopenia, presenting with weakness and falls, found to have ARF with UA evidence of UTI, also with a new right upper pole kidney mass.   # Acute on Chronic Kidney Disease - history of CKD stage 5, baseline Cr ~ 4-4.8, presenting with Cr = 5.4, now improving with IVF. Per patient report, Cr was 4.1 about 1 week PTA. Etiology is likely prerenal due to decreased PO intake in the setting of a UTI. She notes no symptoms of urinary obstruction.  Likely no need for acute HD during this admission.  The patient agrees to AV fistula placement. -order Mg,  Phos, LFT's  -continue NS at 100 cc/hr  -ceftriaxone for UTI, culture pending  -follow daily renal function panel  -patient amenable to AV fistula placement, which will likely be done in the outpatient setting  # Right Renal Lesion - 3.5x3.7x3.9 cm lesion noted on the upper pole of the right kidney, concerning for mass. Patient noted to have anemia, but notes no hematuria, flank pain, palpable mass on examination, weight loss, or night sweats. MRI with contrast would likely better visualize the lesion, but additional contrast could significantly damage her kidneys.  -MRI abdomen WITHOUT contrast to evaluate renal mass - performed, results pending  # Anemia of Chronic Disease - the patient has a history of anemia, likely secondary to anemia of chronic disease. However, hemoglobin after IV fluids today is 9.6, down from a prior baseline of 11-12 in Jan 2013. She is currently on home iron supplementation with ferrous sulfate BID. Anemia panel shows saturation ratio of 14. -feraheme 510 mg IV today  # Diabetes Mellitus - A1C = 7.2 -per primary team   # COPD - patient notes cough and subjective "wheezing"  -per primary team, scheduled nebs, symbicort, ceftriaxone   # Weakness - unclear etiology, possibly UTI vs deconditioning vs ?cancer  -per primary team, PT eval   # Hypothyroidism - chronic, stable   # Hyperlipidemia  -per primary team, continue statin   Janalyn Harder, PGY2 Pgr. [phone] 03/10/2012, 7:51 AM I have seen and examined this patient and agree with plan   Renal fx essentially at baseline.  Will ask VVS to see for access placement.  Rt kidney shows mass suspicious for CA.  I would recommend having Urology see pt.  Certainly if a nephrectomy needs to be done she will need HD. Fronnie Urton T,MD 03/10/2012 11:17 AM

## 2012-03-11 DIAGNOSIS — R1084 Generalized abdominal pain: Secondary | ICD-10-CM

## 2012-03-11 DIAGNOSIS — R269 Unspecified abnormalities of gait and mobility: Secondary | ICD-10-CM

## 2012-03-11 DIAGNOSIS — M549 Dorsalgia, unspecified: Secondary | ICD-10-CM

## 2012-03-11 DIAGNOSIS — F411 Generalized anxiety disorder: Secondary | ICD-10-CM

## 2012-03-11 DIAGNOSIS — Z01810 Encounter for preprocedural cardiovascular examination: Secondary | ICD-10-CM

## 2012-03-11 LAB — RENAL FUNCTION PANEL
BUN: 50 mg/dL — ABNORMAL HIGH (ref 6–23)
CO2: 21 mEq/L (ref 19–32)
Calcium: 8.6 mg/dL (ref 8.4–10.5)
Creatinine, Ser: 3.7 mg/dL — ABNORMAL HIGH (ref 0.50–1.10)
Glucose, Bld: 73 mg/dL (ref 70–99)

## 2012-03-11 LAB — LACTATE DEHYDROGENASE: LDH: 182 U/L (ref 94–250)

## 2012-03-11 LAB — CBC
Hemoglobin: 9.8 g/dL — ABNORMAL LOW (ref 12.0–15.0)
MCHC: 32.9 g/dL (ref 30.0–36.0)
Platelets: 209 10*3/uL (ref 150–400)
RDW: 15.4 % (ref 11.5–15.5)

## 2012-03-11 LAB — GLUCOSE, CAPILLARY
Glucose-Capillary: 130 mg/dL — ABNORMAL HIGH (ref 70–99)
Glucose-Capillary: 82 mg/dL (ref 70–99)
Glucose-Capillary: 96 mg/dL (ref 70–99)

## 2012-03-11 MED ORDER — DOCUSATE SODIUM 100 MG PO CAPS
200.0000 mg | ORAL_CAPSULE | ORAL | Status: AC
  Administered 2012-03-11: 200 mg via ORAL
  Filled 2012-03-11: qty 2

## 2012-03-11 MED ORDER — DOCUSATE SODIUM 100 MG PO CAPS
100.0000 mg | ORAL_CAPSULE | Freq: Two times a day (BID) | ORAL | Status: DC | PRN
  Administered 2012-03-12 – 2012-03-14 (×3): 100 mg via ORAL
  Filled 2012-03-11 (×3): qty 1

## 2012-03-11 NOTE — Progress Notes (Signed)
Physical Therapy Treatment Patient Details Name: [name] [name] MRN: [id number] DOB: [birth year] Today's Date: 03/11/2012 Time: 4098-1191 PT Time Calculation (min): 23 min  PT Assessment / Plan / Recommendation Comments on Treatment Session  Concern for falls at home and the fact that pt lives alone; Discussed with Case Mgr, and a short-term rehab stay at SNF level is not out of the question, as they can continue to work with pt on balance    Follow Up Recommendations  Home health PT (will also consider SNF)    Barriers to Discharge        Equipment Recommendations  Rolling walker with 5" wheels (pt may already have)    Recommendations for Other Services OT consult  Frequency Min 3X/week   Plan Discharge plan remains appropriate;Discharge plan needs to be updated (Will also consider Short-term SNF for rehab)    Precautions / Restrictions Precautions Precautions: Fall Precaution Comments: Reports she falls at home   Pertinent Vitals/Pain Denies pain    Mobility  Bed Mobility Bed Mobility: Supine to Sit;Sitting - Scoot to Edge of Bed Supine to Sit: 6: Modified independent (Device/Increase time) Sitting - Scoot to Edge of Bed: 6: Modified independent (Device/Increase time) Transfers Transfers: Sit to Stand;Stand to Sit Sit to Stand: 5: Supervision;From bed;From chair/3-in-1;With upper extremity assist Stand to Sit: 5: Supervision;To bed;To chair/3-in-1 Details for Transfer Assistance: Improving control of descent with stand to sit, especially with focus on control with serial sit to/from stands for therex Ambulation/Gait Ambulation/Gait Assistance: 4: Min assist Ambulation Distance (Feet): 200 Feet Assistive device: Straight cane Ambulation/Gait Assistance Details: a few small losses of balance mostly associated with turns during amb with cane, requiring min assist for steadying; Concern for falls at home and the fact that pt lives alone; Discussed with Case Mgr, and a short-term  rehab stay at SNF level is not out of the question, as they can continue to work with pt on balance Gait velocity: decreased    Exercises Other Exercises Other Exercises: Serial sit to/from stands from chair; 5 reps Other Exercises: sit to/from stand with pause midway for strengthening; 5 reps   PT Diagnosis:    PT Problem List:   PT Treatment Interventions:     PT Goals Acute Rehab PT Goals Time For Goal Achievement: 03/23/12 Potential to Achieve Goals: Good Pt will go Supine/Side to Sit: Independently PT Goal: Supine/Side to Sit - Progress: Progressing toward goal Pt will go Sit to Supine/Side: Independently PT Goal: Sit to Supine/Side - Progress: Progressing toward goal Pt will go Sit to Stand: with modified independence PT Goal: Sit to Stand - Progress: Progressing toward goal Pt will go Stand to Sit: with modified independence PT Goal: Stand to Sit - Progress: Progressing toward goal Pt will Ambulate: >150 feet;with modified independence;with least restrictive assistive device PT Goal: Ambulate - Progress: Progressing toward goal  Visit Information  Last PT Received On: 03/11/12 Assistance Needed: +1    Subjective Data  Subjective: Agreeable to amb Patient Stated Goal: get better   Cognition  Overall Cognitive Status: Appears within functional limits for tasks assessed/performed Arousal/Alertness: Awake/alert Orientation Level: Appears intact for tasks assessed Behavior During Session: [facility] for tasks performed    Balance     End of Session PT - End of Session Activity Tolerance: Patient tolerated treatment well Patient left: in bed;with bed alarm set;with call bell/phone within reach Nurse Communication: Mobility status   GP     Van Clines [facility] [place], [place] [phone]  03/11/2012,  12:58 PM

## 2012-03-11 NOTE — Progress Notes (Signed)
Patient ID: [name] [name]  female  WUJ:[id number]    DOB: 03/20/1941    DOA: 03/08/2012  PCP: Oliver Barre, MD  Subjective: Overnight issues noted, patient had a mechanical fall, states lost her balance when she bends down to pick up an item from the floor. Patient continues to have concerned about her renal mass and further management and wants to discuss it with her nephrologist, Dr. Lowell Guitar. Otherwise no complaints this morning no chest pain, shortness of breath, fevers, chills, nausea vomiting.  Objective: Weight change: 3.506 kg (7 lb 11.7 oz)  Intake/Output Summary (Last 24 hours) at 03/11/12 1518 Last data filed at 03/11/12 1300  Gross per 24 hour  Intake 2605.83 ml  Output   2575 ml  Net  30.83 ml   Blood pressure 144/84, pulse 80, temperature 98.2 F (36.8 C), temperature source Oral, resp. rate 19, height 5\' 7"  (1.702 m), weight 84.006 kg (185 lb 3.2 oz), SpO2 98.00%.  Physical Exam: General: Alert and awake, oriented x3, not in any acute distress. Somewhat anxious today HEENT: anicteric sclera, pupils reactive to light and accommodation, EOMI CVS: S1-S2 clear, no murmur rubs or gallops Chest: Clear to auscultation bilaterally  Abdomen: soft nontender, nondistended, normal bowel sounds, no organomegaly Extremities: no cyanosis, clubbing or edema noted bilaterally Neuro: Cranial nerves II-XII intact, no focal neurological deficits  Lab Results: Basic Metabolic Panel:  Lab 03/11/12 9562 03/10/12 0510 03/09/12 1344  NA 142 139 --  K 4.7 4.4 --  CL 111 105 --  CO2 21 24 --  GLUCOSE 73 77 --  BUN 50* 57* --  CREATININE 3.70* 4.42* --  CALCIUM 8.6 8.2* --  MG -- -- 2.9*  PHOS 3.7 -- --   CBC:  Lab 03/11/12 0550 03/10/12 0510  WBC 5.9 6.0  NEUTROABS -- --  HGB 9.8* 9.9*  HCT 29.8* 29.5*  MCV 91.4 93.7  PLT 209 207   Cardiac Enzymes:  Lab 03/08/12 2028  CKTOTAL 84  CKMB --  CKMBINDEX --  TROPONINI --   BNP: No components found with this basename:  POCBNP:2 CBG:  Lab 03/11/12 1141 03/11/12 0734 03/10/12 2111 03/10/12 1656 03/10/12 1147  GLUCAP 130* 73 82 79 76     Micro Results: Recent Results (from the past 240 hour(s))  URINE CULTURE     Status: Normal   Collection Time   03/09/12  8:15 AM      Component Value Range Status Comment   Specimen Description URINE, CLEAN CATCH   Final    Special Requests NONE   Final    Culture  Setup Time 03/09/2012 16:24   Final    Colony Count 85,000 COLONIES/ML   Final    Culture     Final    Value: Multiple bacterial morphotypes present, none predominant. Suggest appropriate recollection if clinically indicated.   Report Status 03/10/2012 FINAL   Final     Studies/Results: Dg Chest 2 View  03/08/2012  *RADIOLOGY REPORT*  Clinical Data: Weakness and pain.  CHEST - 2 VIEW  Comparison: 02/22/2013from [facility].  Findings: Shallow inspiration with elevation of the left hemidiaphragm.  Heart size and pulmonary vascularity are normal. Focal linear scarring in the left midlung is stable since previous study.  Suggestion of atelectasis or vascular crowding in the right lung base.  No focal consolidation.  No blunting of costophrenic angles.  No pneumothorax.  Calcified and tortuous aorta.  Surgical clips in the base of the neck.  Degenerative changes in  the spine and shoulders.  IMPRESSION: Elevation of left hemidiaphragm with linear scarring in the left lung.  Atelectasis versus vascular crowding in the right lung base. Stable appearance since previous study.  Original Report Authenticated By: Marlon Pel, M.D.   Ct Head Wo Contrast  03/08/2012  *RADIOLOGY REPORT*  Clinical Data: Fall with head injury and headache.  CT HEAD WITHOUT CONTRAST  Technique:  Contiguous axial images were obtained from the base of the skull through the vertex without contrast.  Comparison: 08/18/2011 MRI and 08/17/2011 CT.  Findings: Mild atrophy and chronic small vessel white matter ischemic changes again noted.   No acute intracranial abnormalities are identified, including mass lesion or mass effect, hydrocephalus, extra-axial fluid collection, midline shift, hemorrhage, or acute infarction.  The visualized bony calvarium is unremarkable.  IMPRESSION: No evidence of acute intracranial abnormality.  Unchanged atrophy and chronic small vessel white matter ischemic changes.  Original Report Authenticated By: Rosendo Gros, M.D.    Medications: Scheduled Meds:    . amLODipine  5 mg Oral Daily  . atorvastatin  40 mg Oral Daily  . budesonide-formoterol  2 puff Inhalation BID  . cefTRIAXone (ROCEPHIN)  IV  1 g Intravenous Q24H  . DULoxetine  60 mg Oral Daily  . enoxaparin (LOVENOX) injection  30 mg Subcutaneous Q24H  . ferrous sulfate  325 mg Oral BID WC  . ferumoxytol  510 mg Intravenous Q3 days  . fluticasone  2 spray Each Nare Daily  . glimepiride  1 mg Oral QAC breakfast  . insulin aspart  0-5 Units Subcutaneous QHS  . insulin aspart  0-9 Units Subcutaneous TID WC  . levothyroxine  112 mcg Oral Q0600  . montelukast  10 mg Oral Daily  . nicotine  21 mg Transdermal Daily  . pantoprazole  40 mg Oral Q1200  . pregabalin  200 mg Oral Daily   Continuous Infusions:    . sodium chloride 100 mL/hr at 03/10/12 2033     Assessment/Plan: Principal Problem:  *Renal failure (ARF), acute on chronic: Improving, baseline creatinine ~4, today 3.7 - Continue gentle hydration, possibly precipitated due to UTI (urine culture negative so for), dehydration with progression of the chronic kidney disease - Renal ultrasound showed 3.5 X3 0.7x 3.9 cm renal mass on the right kidney, confirmed with the MRI abdomen without contrast - Renal service following, recommending continue hydration, patient is agreeable to AV fistula placement - Vascular surgery consulted, renal surgery and vascular surgery to coordinate AV fistula placement during this admission.   Right renal lesion: Newly diagnosed in the upper pole of  right kidney concerning for tumor/mass -Urology consulted, appreciate Dr. Hilario Quarry recommendations, plan for percutaneous biopsy during this admission - If she needs a nephrectomy, will definitely be on hemodialysis thereafter.   HYPOTHYROIDISM: Continue Synthroid, TSH 0.35    DIABETES MELLITUS, TYPE II: Controlled inpatient, hemoglobin A1c 7.2 - Continue sliding scale insulin, Amaryl   HYPERLIPIDEMIA: Continue statins   ANEMIA-NOS: Likely anemia of chronic disease, per renal   CIGARETTE SMOKER - Counseled on smoking cessation   HYPERTENSION: Stable   Generalized weakness: - Increasing falls likely secondary  to UTI and acute renal insufficiency, will need home health PT    UTI (lower urinary tract infection): - Follow urine culture, continue Rocephin, Urine culture with 85,000 colonies   COPD bronchitis: Resolved - Continue scheduled nebs, Symbicort,  Rocephin, O2 supplementation  DVT Prophylaxis: Lovenox  Code Status: Full  Disposition: Not medically ready, PT recommending home health PT  or SNF.   LOS: 3 days   Kerina Simoneau M.D. [facility] 03/11/2012, 3:18 PM Pager: [phone]  If 7PM-7AM, please contact night-coverage [email or web address] Password [email or web address]

## 2012-03-11 NOTE — Plan of Care (Signed)
Problem: Food- and Nutrition-Related Knowledge Deficit (NB-1.1) Goal: Nutrition education Formal process to instruct or train a patient/client in a skill or to impart knowledge to help patients/clients voluntarily manage or modify food choices and eating behavior to maintain or improve health.  Outcome: Completed/Met Date Met:  03/11/12 Nutrition Education Note  RD consulted for Renal Education. Provided Kidney Disease Pyramid to patient. Reviewed food groups and provided written recommended serving sizes specifically determined for patient's current nutritional status. Explained why diet restrictions are needed and provided lists of foods to limit/avoid that are high potassium, sodium, and phosphorus for future use. Provided specific recommendations on safer alternatives of these foods. Strongly encouraged compliance of this diet.   Also provided Low Sodium and Carbohydrate Counting Handouts. Pt verbalized understanding of necessary diet changes to make. Noted that pt depends on mobile meals for her dinners.  Expect fair compliance.  Body mass index is 29.01 kg/(m^2). Pt meets criteria for overweight based on current BMI.  Current diet order is Renal 80-90, patient is consuming approximately 100% of meals at this time. Labs and medications reviewed. No further nutrition interventions warranted at this time. RD contact information provided. If additional nutrition issues arise, please re-consult RD.  Jarold Motto MS, RD, LDN Pager: [phone] After-hours pager: [phone]

## 2012-03-11 NOTE — Progress Notes (Signed)
Nephrology Service Daily Progress Note  Subjective:  Overnight, the patient bent down to pick up an item from the floor, lost her balance, and "lowered herself" to the floor.  She was found by nursing, with no apparent injuries.  The patient continues to note concern about the need for percutaneous biopsy and AV fistula placement, and would like to discuss with Dr. Lowell Guitar, who she knows and trusts, before proceeding.  We will try to arrange that today so that we can get the ball rolling on these necessary interventions, which could be done during this hospitalization.  Good UOP overnight. S/p feraheme yesterday.  Objective Vital signs in last 24 hours: Filed Vitals:   03/10/12 1805 03/10/12 1947 03/10/12 2024 03/11/12 0546  BP: 144/69  152/73 145/75  Pulse:   83 79  Temp:   98.1 F (36.7 C) 97.9 F (36.6 C)  TempSrc:   Oral Oral  Resp: 18  19 19   Height:   5\' 7"  (1.702 m)   Weight:   185 lb 3.2 oz (84.006 kg)   SpO2: 98% 92% 100% 98%   Weight change: 7 lb 11.7 oz (3.506 kg)  Intake/Output Summary (Last 24 hours) at 03/11/12 0746 Last data filed at 03/11/12 9562  Gross per 24 hour  Intake 2705.83 ml  Output   2200 ml  Net 505.83 ml  UOP: 2200 mL Labs: Basic Metabolic Panel:  Lab 03/11/12 1308 03/10/12 0510 03/09/12 1344 03/09/12 0500 03/08/12 2045  NA 142 139 -- 141 140  K 4.7 4.4 -- 4.0 4.2  CL 111 105 -- 103 107  CO2 21 24 -- 27 --  GLUCOSE 73 77 -- 149* 104*  BUN 50* 57* -- 63* 63*  CREATININE 3.70* 4.42* -- 5.08* 5.40*  ALB -- -- -- -- --  CALCIUM 8.6 8.2* -- 8.5 --  PHOS 3.7 5.2* 4.9* -- --   Liver Function Tests:  Lab 03/11/12 0550 03/10/12 0510 03/09/12 1344  AST -- -- 14  ALT -- -- 9  ALKPHOS -- -- 66  BILITOT -- -- 0.1*  PROT -- -- 6.8  ALBUMIN 2.8* 3.0* 3.3*   CBC:  Lab 03/11/12 0550 03/10/12 0510 03/09/12 0500 03/08/12 2045 03/08/12 2028  WBC 5.9 6.0 5.4 -- 7.2  NEUTROABS -- -- -- -- --  HGB 9.8* 9.9* 9.6* 10.5* --  HCT 29.8* 29.5* 28.7* 31.0* --    MCV 91.4 93.7 93.2 -- 91.7  PLT 209 207 199 -- 196   Cardiac Enzymes:  Lab 03/08/12 2028  CKTOTAL 84  CKMB --  CKMBINDEX --  TROPONINI --   CBG:  Lab 03/11/12 0734 03/10/12 2111 03/10/12 1656 03/10/12 1147 03/10/12 0737  GLUCAP 73 82 79 76 92    Iron Studies:   Lab 03/09/12 0500  IRON 33*  TIBC 229*  TRANSFERRIN --  FERRITIN 315*  Saturation ratio: 14  Outside Labs - from [facility], 01/23/12 Creatinine: 4.15  Studies/Results: Mr Abdomen Wo Contrast  03/10/2012  *RADIOLOGY REPORT*  Clinical Data: Evaluate renal mass on ultrasound  MRI ABDOMEN WITHOUT CONTRAST  Technique:  Multiplanar multisequence MR imaging of the abdomen was performed. No intravenous contrast was administered.  Comparison: Ultrasound dated 03/09/2012  Findings: 3.6 x 3.3 x 3.1 cm exophytic right upper pole renal lesion, corresponding to the sonographic abnormality.  A portion of the lesion is T1 hyperintense (series 1000/image 23), likely reflecting hemorrhage, with associated decreased signal anteriorly on the in-phase imaging suggesting susceptibility artifact related to hemosiderin deposition.  No  restricted diffusion.  No definite macroscopic fat.  Additional bilateral renal cysts.  No hydronephrosis.  Decreased signal in the liver and spleen on in-phase imaging, suggesting iron deposition.  Pancreas and right adrenal gland are within normal limits.  Nodular thickening of the left adrenal gland (series 5/image 12), incompletely characterized.  No abdominal ascites.  No suspicious abdominal lymphadenopathy.  Thoracolumbar scoliosis, without focal osseous lesion.  IMPRESSION: 3.6 cm exophytic right upper pole renal lesion, suspicious for solid renal neoplasm with associated hemorrhage.  Original Report Authenticated By: Charline Bills, M.D.   US Renal  03/09/2012  *RADIOLOGY REPORT*  Clinical Data: Acute renal failure.  History of chronic kidney disease.  Rule out obstruction.  RENAL/URINARY TRACT  ULTRASOUND COMPLETE  Comparison:  Abdominal ultrasound 05/21/2005.  Findings:  Right Kidney:  In the upper pole of the right kidney there is a 3.5 x 3.7 x 3.9 cm lesion of heterogeneous echogenicity which is concerning for a solid mass.  This appears have some internal blood flow on color Doppler imaging.  The renal parenchyma is diffusely echogenic.  There is also a small 1.1 x 0.9 by 1.0 cm anechoic lesion in the upper pole of the right kidney with some associated through transmission, likely representing a small cyst.  No hydronephrosis.  9.1 cm in length.  Left Kidney:  A 1.4 x 1.5 x 1.3 cm anechoic lesion with associated increase through transmission in the interpolar region.  The kidney is poorly visualized and is somewhat echogenic in appearance.  No hydronephrosis.  9.4 cm in length.  Bladder:  Urinary bladder is moderately distended without focal wall abnormalities.  IMPRESSION: 1.  3.5 x 3.7 x 3.9 cm indeterminate lesion in the upper pole of the right kidney is concerning for a renal mass.  Further evaluation with contrast enhanced MRI is recommended for more definitive characterization after resolution of the patient's acute renal failure. 2.  Additional small cysts in the kidneys bilaterally, as above. 3.  Diffusely echogenic renal parenchyma bilaterally, suggestive of medical renal disease.  Original Report Authenticated By: Florencia Reasons, M.D.   Medications: Scheduled Meds:    . amLODipine  5 mg Oral Daily  . atorvastatin  40 mg Oral Daily  . budesonide-formoterol  2 puff Inhalation BID  . cefTRIAXone (ROCEPHIN)  IV  1 g Intravenous Q24H  . DULoxetine  60 mg Oral Daily  . enoxaparin (LOVENOX) injection  30 mg Subcutaneous Q24H  . ferrous sulfate  325 mg Oral BID WC  . ferumoxytol  510 mg Intravenous Q3 days  . fluticasone  2 spray Each Nare Daily  . glimepiride  1 mg Oral QAC breakfast  . insulin aspart  0-5 Units Subcutaneous QHS  . insulin aspart  0-9 Units Subcutaneous TID WC    . levothyroxine  112 mcg Oral Q0600  . montelukast  10 mg Oral Daily  . nicotine  21 mg Transdermal Daily  . pantoprazole  40 mg Oral Q1200  . pregabalin  200 mg Oral Daily   Continuous Infusions:    . sodium chloride 100 mL/hr at 03/10/12 0322   PRN Meds:.acetaminophen, acetaminophen, albuterol, albuterol, calcium carbonate (dosed in mg elemental calcium), clonazePAM, cyclobenzaprine, HYDROmorphone (DILAUDID) injection, hydrOXYzine, ondansetron (ZOFRAN) IV, ondansetron, oxyCODONE, promethazine, traZODone, DISCONTD: alum & mag hydroxide-simeth  I  have reviewed scheduled and prn medications.  Physical Exam:   PEX General: alert, cooperative, and in no apparent distress HEENT: pupils equal round and reactive to light, vision grossly intact, oropharynx clear and non-erythematous  Neck: supple, no lymphadenopathy, no JVD Lungs: mild ronchi in R lung base, normal work of respiration, no wheezes, rales, ronchi Heart: regular rate and rhythm, no murmurs, gallops, or rubs Abdomen: soft, non-tender, non-distended, normal bowel sounds  Extremities: bilateral non-pitting edema, DP/PT pulses 2+ bilatearlly Neurologic: alert & oriented X3, cranial nerves II-XII intact, strength grossly intact, sensation intact to light touch  Assessment/Plan:  The patient is a 71 yo woman, history of CKD stage 5 (followed by Dr. Lowell Guitar, baseline Cr ~ 4.0) with no AV fistula, COPD, DM2, HL, HTN, hypothyroidism, osteopenia, presenting with weakness and falls, found to have ARF with UA evidence of UTI, also with a new right upper pole kidney mass.   # Acute on Chronic Kidney Disease - history of CKD stage 5, baseline Cr ~ 4-4.8, presenting with Cr = 5.4, now below baseline with IVF. (last Cr = 4.13 on 01/23/12), likely prerenal in etiology. Patient appears amenable to AV fistula, but would like to discuss with Dr. Lowell Guitar before proceeding.  If nephrectomy is needed, the patient will almost certainly need to start  dialysis -as Cr has normalized and patient taking PO, consider d/c IVF -ceftriaxone for UTI, though culture results were equivocal -follow daily renal function panel  -if patient amenable, hopefully plan for AV fistula placement during this admission  # Right Renal Lesion - 3.5x3.7x3.9 cm lesion noted on the upper pole of the right kidney, concerning for mass. Patient noted to have anemia, but notes no hematuria, flank pain, palpable mass on examination, weight loss, or night sweats. Urology consulted, and recommends percutaneous biopsy, which could be done during this admission. -greatly appreciate Urology consult -if patient amenable, plan for percutaneous biopsy during this admission  # Anemia of Chronic Disease - the patient has a history of anemia, likely secondary to anemia of chronic disease, with a baseline Hb of 11-12 in Jan 2013. She is currently on home iron supplementation with ferrous sulfate BID. Anemia panel shows saturation ratio of 14. -s/p Feraheme 8/5, repeat 8/8 -follow daily CBC's  # Diabetes Mellitus - A1C = 7.2 -per primary team   # COPD - patient notes cough and subjective "wheezing"  -per primary team, scheduled nebs, symbicort, ceftriaxone   # Weakness - unclear etiology, possibly UTI vs deconditioning vs likely cancer   -per primary team  # Hypothyroidism - chronic, stable   # Hyperlipidemia  -per primary team, continue statin   Janalyn Harder, PGY2 Pgr. [phone] 03/11/2012, 7:46 AM  I have seen and examined this patient and agree with plan by Dr Manson Passey.  She is agreeable to having access placed this hospitalization, so will let [facility] know.  She still needs to decide about bx of renal mass. Dc IV fluids. Could chang to PO antibiotics to finish up for 5d course. Jackie Littlejohn T,MD 03/11/2012 11:40 AM

## 2012-03-11 NOTE — Progress Notes (Signed)
VASCULAR LAB PRELIMINARY  PRELIMINARY  PRELIMINARY  PRELIMINARY  Right  Upper Extremity Vein Map    Cephalic  Segment Diameter Depth Comment  1. Near shoulder 2.46mm mm   2. Mid upper arm 1mm mm   3. Above AC 1.61mm mm branch  4. In AC 2.80mm mm   5. Below AC 1.63mm mm   6. Mid forearm 1mm mm   7. Wrist above ID 1mm mm    mm mm    mm mm    mm mm    Basilic  Segment Diameter Depth Comment  1. Axilla mm mm   2. Mid upper arm 3.39mm 1.5cm   3. Above AC 1.56mm 0.75cm   4. In AC mm mm Not visualized  5. Below AC mm mm Not visualized  6. Mid forearm mm mm   7. Wrist mm mm    mm mm    mm mm    mm mm     Left Upper Extremity Vein Map    Cephalic  Segment Diameter Depth Comment  1. Axilla mm mm Not visualized  2. Mid upper arm 1mm mm   3. Above AC 1.55mm mm   4. In AC 1.41mm mm   5. Below AC 1.82mm mm   6. Mid forearm 1.35mm mm   7. Lateral above wrist 1mm mm    mm mm    mm mm    mm mm    Basilic  Segment Diameter Depth Comment  1. Axilla mm mm   2. Mid upper arm 3.52mm 1.9cm   3. Above AC 2.3mm 1.1cm   4. In AC 1.46mm 7.6cm   5. Below AC mm mm Not visualized  6. Mid forearm mm mm   7. Wrist mm mm    mm mm    mm mm    mm mm     Emelda Kohlbeck, 03/11/2012, 10:43 AM

## 2012-03-12 DIAGNOSIS — F329 Major depressive disorder, single episode, unspecified: Secondary | ICD-10-CM

## 2012-03-12 DIAGNOSIS — N2889 Other specified disorders of kidney and ureter: Secondary | ICD-10-CM | POA: Diagnosis present

## 2012-03-12 LAB — CBC
Hemoglobin: 10.9 g/dL — ABNORMAL LOW (ref 12.0–15.0)
MCH: 30.7 pg (ref 26.0–34.0)
Platelets: 227 10*3/uL (ref 150–400)
RBC: 3.55 MIL/uL — ABNORMAL LOW (ref 3.87–5.11)

## 2012-03-12 LAB — RENAL FUNCTION PANEL
Albumin: 3.1 g/dL — ABNORMAL LOW (ref 3.5–5.2)
CO2: 22 mEq/L (ref 19–32)
Chloride: 105 mEq/L (ref 96–112)
Creatinine, Ser: 3.57 mg/dL — ABNORMAL HIGH (ref 0.50–1.10)
GFR calc Af Amer: 14 mL/min — ABNORMAL LOW (ref >90)
GFR calc non Af Amer: 12 mL/min — ABNORMAL LOW (ref >90)
Potassium: 4.6 mEq/L (ref 3.5–5.1)
Sodium: 138 mEq/L (ref 135–145)

## 2012-03-12 LAB — GLUCOSE, CAPILLARY: Glucose-Capillary: 102 mg/dL — ABNORMAL HIGH (ref 70–99)

## 2012-03-12 MED ORDER — CIPROFLOXACIN HCL 250 MG PO TABS
250.0000 mg | ORAL_TABLET | ORAL | Status: DC
  Administered 2012-03-12 – 2012-03-13 (×2): 250 mg via ORAL
  Filled 2012-03-12 (×3): qty 1

## 2012-03-12 NOTE — Progress Notes (Signed)
Nephrology Service Daily Progress Note  Subjective:  Yesterday, nutrition saw the patient and discussed a diabetic diet.  BP's have been stable overnight.  UOP was not strictly recorded overnight, but patient reports urinating without difficulty.  Objective Vital signs in last 24 hours: Filed Vitals:   03/11/12 1402 03/11/12 1946 03/11/12 2056 03/12/12 0508  BP: 144/84  157/85 148/76  Pulse: 80  89 73  Temp: 98.2 F (36.8 C)  98.9 F (37.2 C) 97.8 F (36.6 C)  TempSrc: Oral  Oral Oral  Resp: 19  17 18   Height:      Weight:      SpO2: 98% 98% 98% 100%   Weight change:   Intake/Output Summary (Last 24 hours) at 03/12/12 0846 Last data filed at 03/12/12 0830  Gross per 24 hour  Intake 3119.58 ml  Output    775 ml  Net 2344.58 ml  UOP: 500 mL recorded on day-shift  Labs: Basic Metabolic Panel:  Lab 03/12/12 1610 03/11/12 0550 03/10/12 0510 03/09/12 1344 03/09/12 0500 03/08/12 2045  NA 138 142 139 -- 141 140  K 4.6 4.7 4.4 -- 4.0 4.2  CL 105 111 105 -- 103 107  CO2 22 21 24  -- 27 --  GLUCOSE 118* 73 77 -- 149* 104*  BUN 48* 50* 57* -- 63* 63*  CREATININE 3.57* 3.70* 4.42* -- 5.08* 5.40*  ALB -- -- -- -- -- --  CALCIUM 8.9 8.6 8.2* -- 8.5 --  PHOS 4.2 3.7 5.2* 4.9* -- --   Liver Function Tests:  Lab 03/12/12 0530 03/11/12 0550 03/10/12 0510 03/09/12 1344  AST -- -- -- 14  ALT -- -- -- 9  ALKPHOS -- -- -- 66  BILITOT -- -- -- 0.1*  PROT -- -- -- 6.8  ALBUMIN 3.1* 2.8* 3.0* --   CBC:  Lab 03/12/12 0530 03/11/12 0550 03/10/12 0510 03/09/12 0500  WBC 6.4 5.9 6.0 5.4  NEUTROABS -- -- -- --  HGB 10.9* 9.8* 9.9* 9.6*  HCT 32.9* 29.8* 29.5* 28.7*  MCV 92.7 91.4 93.7 93.2  PLT 227 209 207 199   Cardiac Enzymes:  Lab 03/08/12 2028  CKTOTAL 84  CKMB --  CKMBINDEX --  TROPONINI --   CBG:  Lab 03/12/12 0820 03/11/12 2115 03/11/12 1700 03/11/12 1141 03/11/12 0734  GLUCAP 89 96 82 130* 73    Iron Studies:   Lab 03/09/12 0500  IRON 33*  TIBC 229*    TRANSFERRIN --  FERRITIN 315*  Saturation ratio: 14  Outside Labs - from [facility], 01/23/12 Creatinine: 4.15  Urine culture: 85K colonies of multiple organisms  Medications: Scheduled Meds:    . amLODipine  5 mg Oral Daily  . atorvastatin  40 mg Oral Daily  . budesonide-formoterol  2 puff Inhalation BID  . cefTRIAXone (ROCEPHIN)  IV  1 g Intravenous Q24H  . docusate sodium  200 mg Oral NOW  . DULoxetine  60 mg Oral Daily  . enoxaparin (LOVENOX) injection  30 mg Subcutaneous Q24H  . ferumoxytol  510 mg Intravenous Q3 days  . fluticasone  2 spray Each Nare Daily  . glimepiride  1 mg Oral QAC breakfast  . insulin aspart  0-5 Units Subcutaneous QHS  . insulin aspart  0-9 Units Subcutaneous TID WC  . levothyroxine  112 mcg Oral Q0600  . montelukast  10 mg Oral Daily  . nicotine  21 mg Transdermal Daily  . pantoprazole  40 mg Oral Q1200  . pregabalin  200 mg  Oral Daily  . DISCONTD: ferrous sulfate  325 mg Oral BID WC   Continuous Infusions:    . sodium chloride 100 mL/hr at 03/10/12 0322   PRN Meds:.acetaminophen, acetaminophen, albuterol, albuterol, calcium carbonate (dosed in mg elemental calcium), clonazePAM, cyclobenzaprine, docusate sodium, HYDROmorphone (DILAUDID) injection, hydrOXYzine, ondansetron (ZOFRAN) IV, ondansetron, oxyCODONE, promethazine, traZODone  I  have reviewed scheduled and prn medications.  Physical Exam:   General: alert, cooperative, and in no apparent distress HEENT: pupils equal round and reactive to light, vision grossly intact, oropharynx clear and non-erythematous  Neck: supple, no lymphadenopathy, no JVD Lungs: mild ronchi in R lung base, normal work of respiration, no wheezes, rales, ronchi Heart: regular rate and rhythm, no murmurs, gallops, or rubs Abdomen: soft, non-tender, non-distended, normal bowel sounds  Extremities: bilateral non-pitting edema Neurologic: alert & oriented X3, cranial nerves II-XII intact, strength grossly  intact, sensation intact to light touch  Assessment/Plan:  The patient is a 71 yo woman, history of CKD stage 5 (followed by Dr. Lowell Guitar, baseline Cr ~ 4.0) with no AV fistula, COPD, DM2, HL, HTN, hypothyroidism, osteopenia, presenting with weakness and falls, found to have ARF with UA evidence of UTI, also with a new right upper pole kidney mass.   # Acute on Chronic Kidney Disease - history of CKD stage 5, baseline Cr ~ 4-4.8, presenting with Cr = 5.4, now below baseline with IVF. (last Cr = 4.13 on 01/23/12), likely prerenal in etiology  If nephrectomy is needed, the patient will almost certainly need to start dialysis.  Patient agrees to AV fistula. -as Cr has normalized and patient taking PO, consider d/c IVF -urine culture showed likely contamination -antibiotics for ?UTI per primary team, consider changing to PO abx x5 days given urine culture results -follow daily renal function panel  -plan for AV fistula during this admission.  # Right Renal Lesion - 3.5x3.7x3.9 cm lesion noted on the upper pole of the right kidney, concerning for mass. Patient noted to have anemia, but notes no hematuria, flank pain, palpable mass on examination, weight loss, or night sweats. Urology consulted, and recommends percutaneous biopsy, which could be done during this admission. -greatly appreciate Urology consult -plan for percutaneous biopsy during this admission  # Anemia of Chronic Disease - the patient has a history of anemia, likely secondary to anemia of chronic disease, with a baseline Hb of 11-12 in Jan 2013. She is currently on home iron supplementation with ferrous sulfate BID. Anemia panel shows saturation ratio of 14. -s/p Feraheme 8/5, repeat 8/8 -follow daily CBC's  # Diabetes Mellitus - A1C = 7.2 -per primary team   # COPD - patient notes cough and subjective "wheezing"  -per primary team, scheduled nebs, symbicort, ceftriaxone   # Weakness - unclear etiology, possibly UTI vs  deconditioning vs likely cancer   -per primary team  # Hypothyroidism - chronic, stable   # Hyperlipidemia  -per primary team, continue statin   Janalyn Harder, PGY2 Pgr. [phone] 03/12/2012, 8:46 AM I have seen and examined this patient and agree with plan by Dr Manson Passey.  She is to go for Access in am.  Working with PT.  Renal fx at baseline.  Will Dc IV fluids.  Home when OK with primary service. Matthews Franks T,MD 03/12/2012 10:48 AM

## 2012-03-12 NOTE — Clinical Social Work Psychosocial (Signed)
Clinical Social Work Department BRIEF PSYCHOSOCIAL ASSESSMENT 03/12/2012  Patient:  [name] [name], [name] [name]     Account Number:  [id number]     Admit date:  03/08/2012  Clinical Social Worker:  Delmer Islam  Date/Time:  03/12/2012 05:56 AM  Referred by:  Physician  Date Referred:  03/11/2012 Referred for  SNF Placement   Other Referral:   Interview type:  Patient Other interview type:    PSYCHOSOCIAL DATA Living Status:  ALONE Admitted from facility:   Level of care:   Primary support name:   Primary support relationship to patient:   Degree of support available:   Patient mentioned that she has a friend that is supportive.  When asked, patient responded that she does not have any children.    CURRENT CONCERNS Current Concerns  Post-Acute Placement   Other Concerns:    SOCIAL WORK ASSESSMENT / PLAN CSW and RN care manager Drinda Butts talked with patient about discharge plans - home w/HH services vs SNF for short-term rehab. Patient mentioned that the doctors have talked with her about going somewhere to get physical therapy and she is willing to do this.    Patient given SNF list for [facility] and her choices are [facility] and [facility].   Assessment/plan status:  Psychosocial Support/Ongoing Assessment of Needs Other assessment/ plan:   Information/referral to community resources:   Patient given SNF list    PATIENT'S/FAMILY'S RESPONSE TO PLAN OF CARE: Patient was pleasant and receptive to talking with CSW and nurse case manager. She is open to rehab and gave her preferences. Patient's concerns is getting clothing for rehab and picking up food items from [facility] that she gets monthly - 8/11. She was urged to call [facility] to determine if she could make other arrangements.

## 2012-03-12 NOTE — Clinical Social Work Placement (Addendum)
Clinical Social Work Department CLINICAL SOCIAL WORK PLACEMENT NOTE 03/12/2012  Patient:  [name] [name], [name] [name]  Account Number:  [id number] Admit date:  03/08/2012  Clinical Social Worker:  Genelle Bal, LCSW  Date/time:  03/12/2012 06:09 AM  Clinical Social Work is seeking post-discharge placement for this patient at the following level of care:   SKILLED NURSING   (*CSW will update this form in Epic as items are completed)   03/12/2012  Patient/family provided with [facility] Department of Clinical Social Work's list of facilities offering this level of care within the geographic area requested by the patient (or if unable, by the patient's family).  03/12/2012  Patient/family informed of their freedom to choose among providers that offer the needed level of care, that participate in Medicare, Medicaid or managed care program needed by the patient, have an available bed and are willing to accept the patient.    Patient/family informed of [facility]' ownership interest in [facility], as well as of the fact that they are under no obligation to receive care at this facility.  PASARR submitted to EDS on 03/12/2012 PASARR number received from EDS on 03/12/12  FL2 transmitted to all facilities in geographic area requested by pt/family on  03/12/2012 FL2 transmitted to all facilities within larger geographic area on   Patient informed that his/her managed care company has contracts with or will negotiate with  certain facilities, including the following:     Patient/family informed of bed offers received: 03/12/12  Patient chooses bed at [facility] Physician recommends and patient chooses bed at    Patient to be transferred to [facility] on  03/14/12  (DTC) Patient to be transferred to facility by ambulance ([facility])  (DTC)  The following physician request were entered in Epic:   Additional Comments: 03/12/12 - Patient preferences are [facility] and  [facility].  03/14/12 DC today to [facility] 03/14/12.

## 2012-03-12 NOTE — Progress Notes (Signed)
Hypoglycemic Event  CBG: 53  Treatment: 15 GM carbohydrate snack  Symptoms: None  Follow-up CBG: Time:1807 CBG Result:107  Possible Reasons for Event: Medication regimen: Amaryl  Comments/MD notified:Dr. Tat    [name] [name]  Remember to initiate Hypoglycemia Order Set & complete

## 2012-03-12 NOTE — Progress Notes (Signed)
While I was administering medications pt stated that she needed to go to the bathroom offered to assist pt since I was present in the room pt declined stated she will get the tech to do it. I offered again stating while I am in the room I would be happy to take her to bathroom she again refused insisting that the tech do the job. I ask the monitor tech to inform the tech she stated that tech was in another room and would be a while. I went back and informed the pt that the tech was in another room assisting another pt and I can take her to the bathroom she stated she would wait. Ilean Skill LPN

## 2012-03-12 NOTE — Progress Notes (Signed)
Pt complaining of new heel pain. Dr. Arbutus Leas, on the unit, notified by Melina Schools, RN. [name] [name], [name] [name]

## 2012-03-12 NOTE — Progress Notes (Signed)
Urology Progress Note  Subjective:     No acute urologic events overnight. No dysuria. We further discussed management of her renal mass. There is a Genitourinary Tumor Board this Friday, and I offered to discuss the patient's case there to get further opinion regarding the next step in management. She would like to obtain this opinion before she proceeds with a biopsy of the right renal mass.  ROS: Negative: Chest pain  Objective:  Patient Vitals for the past 24 hrs:  BP Temp Temp src Pulse Resp SpO2  03/12/12 0508 148/76 mmHg 97.8 F (36.6 C) Oral 73  18  100 %  03/11/12 2056 157/85 mmHg 98.9 F (37.2 C) Oral 89  17  98 %  03/11/12 1946 - - - - - 98 %  03/11/12 1402 144/84 mmHg 98.2 F (36.8 C) Oral 80  19  98 %  03/11/12 0900 162/68 mmHg 97.6 F (36.4 C) Oral 80  20  98 %    Physical Exam: General:  No acute distress, awake Cardiovascular:    [x]   S1/S2 present, RRR  []   Irregularly irregular Chest:  CTA-B Abdomen:               []  Soft, appropriately TTP  [x]  Soft, NTTP  []  Soft, appropriately TTP, incision(s) clean/dry/intact  Genitourinary: No foley  I/O last 3 completed shifts: In: 4307.9 [P.O.:1100; I.V.:3107.9; IV Piggyback:100] Out: 2825 [Urine:2825]  Recent Labs  [facility] 03/12/12 0530 03/11/12 0550   HGB 10.9* 9.8*   WBC 6.4 5.9   PLT 227 209    Recent Labs  Basename 03/12/12 0530 03/11/12 0550   NA 138 142   K 4.6 4.7   CL 105 111   CO2 22 21   BUN 48* 50*   CREATININE 3.57* 3.70*   CALCIUM 8.9 8.6   GFRNONAA 12* 11*   GFRAA 14* 13*     No results found for this basename: PT:2,INR:2,APTT:2 in the last 72 hours   No components found with this basename: ABG:2    Length of stay: 4 days.  Assessment: Right renal mass. Chronic renal insufficiency.  Plan: -No biopsy of right renal mass for now. -Will present her case a Tumor Board this Friday, 03/14/12 and follow up with patient regarding recommendations.   Natalia Leatherwood, MD [phone]

## 2012-03-12 NOTE — Progress Notes (Signed)
[facility] PROGRESS NOTE  Kassidi Elza Cremer QQV:[id number] DOB: 07/15/1941 DOA: 03/08/2012 PCP: Oliver Barre, MD  Brief narrative: Patient complains of some wheezing. However, she relates this to some vocal cord surgery she had in the distant past. She is not actually having any dyspnea on exertion. She denies any chest pain, nausea, vomiting, diarrhea, fevers, chills. Assessment/plan *Renal failure (ARF), acute on chronic: Improving, baseline creatinine ~4, today 3.7  - Continue gentle hydration, possibly precipitated due to UTI (urine culture negative so for), dehydration with progression of the chronic kidney disease  -On August 7, the patient's creatinine was at baseline at 3.5. - Renal ultrasound showed 3.5 X3 0.7x 3.9 cm renal mass on the right kidney, confirmed with the MRI abdomen without contrast  - Renal service following, recommending continue hydration, patient is agreeable to AV fistula placement  - Vascular surgery consulted, renal surgery and vascular surgery to coordinate AV fistula placement during this admission. The patient is scheduled to have AV graft placement on August 8 Right renal lesion: Newly diagnosed in the upper pole of right kidney concerning for tumor/mass  -Urology consulted, appreciate Dr. Hilario Quarry recommendations, plan for percutaneous biopsy during this admission. Dr. Margarita Grizzle is planning to await recommendations from tumor board this Friday before making any further recommendations. - If she needs a nephrectomy, will definitely be on hemodialysis thereafter.  HYPOTHYROIDISM: Continue Synthroid, TSH 0.35  DIABETES MELLITUS, TYPE II: Controlled inpatient, hemoglobin A1c 7.2  - Continue sliding scale insulin. Discontinue Amaryl at this time with anticipation of n.p.o. status for August 8 surgery. Sugars have been well-controlled. HYPERLIPIDEMIA: Continue statins  ANEMIA-NOS: Likely anemia of chronic disease, per renal  CIGARETTE SMOKER  - Counseled on smoking  cessation  HYPERTENSION: Stable  Generalized weakness:  - Increasing falls likely secondary to UTI and acute renal insufficiency, will need home health PT  UTI (lower urinary tract infection):  - Follow urine culture, continue Rocephin, Urine culture with 85,000 colonies  COPD bronchitis: Resolved  -Exam did not reveal any wheezing. The patient was started back on Symbicort and albuterol - Continue scheduled nebs, Symbicort, Rocephin, O2 supplementation   Principal Problem:  *Renal failure (ARF), acute on chronic Active Problems:  HYPOTHYROIDISM  DIABETES MELLITUS, TYPE II  HYPERLIPIDEMIA  ANEMIA-NOS  CIGARETTE SMOKER  HYPERTENSION  Generalized weakness  UTI (lower urinary tract infection)  COPD bronchitis   Consultants:  Renal  Urology  Vascular surgery  Antibiotics:  Ceftriaxone-8/4-8/7  Ciprofloxacin-8/7-  Code Status: Full Family Communication: Pt at bedside Disposition Plan: Home when medically stable    Objective: Filed Vitals:   03/11/12 2056 03/12/12 0508 03/12/12 0924 03/12/12 1319  BP: 157/85 148/76 138/70 151/71  Pulse: 89 73 78 81  Temp: 98.9 F (37.2 C) 97.8 F (36.6 C) 98.5 F (36.9 C) 98.6 F (37 C)  TempSrc: Oral Oral Oral Oral  Resp: 17 18 18 18   Height:      Weight:      SpO2: 98% 100% 97% 100%    Intake/Output Summary (Last 24 hours) at 03/12/12 1448 Last data filed at 03/12/12 0830  Gross per 24 hour  Intake 2879.58 ml  Output    525 ml  Net 2354.58 ml    Exam:   General:  Pt is alert, follows commands appropriately, not in acute distress  Cardiovascular: Regular rate and rhythm, S1/S2, no murmurs, no rubs, no gallops  Respiratory: Clear to auscultation bilaterally, no wheezing, no crackles, no rhonchi  Abdomen: Soft, non tender, non distended, bowel sounds  present, no guarding  Extremities: No edema, pulses DP and PT palpable bilaterally  Neuro: Grossly nonfocal  Data Reviewed: Basic Metabolic Panel:  Lab  03/12/12 0530 03/11/12 0550 03/10/12 0510 03/09/12 1344 03/09/12 0500 03/08/12 2045  NA 138 142 139 -- 141 140  K 4.6 4.7 4.4 -- 4.0 4.2  CL 105 111 105 -- 103 107  CO2 22 21 24  -- 27 --  GLUCOSE 118* 73 77 -- 149* 104*  BUN 48* 50* 57* -- 63* 63*  CREATININE 3.57* 3.70* 4.42* -- 5.08* 5.40*  CALCIUM 8.9 8.6 8.2* -- 8.5 --  MG -- -- -- 2.9* -- --  PHOS 4.2 3.7 5.2* 4.9* -- --   Liver Function Tests:  Lab 03/12/12 0530 03/11/12 0550 03/10/12 0510 03/09/12 1344  AST -- -- -- 14  ALT -- -- -- 9  ALKPHOS -- -- -- 66  BILITOT -- -- -- 0.1*  PROT -- -- -- 6.8  ALBUMIN 3.1* 2.8* 3.0* 3.3*   No results found for this basename: LIPASE:5,AMYLASE:5 in the last 168 hours No results found for this basename: AMMONIA:5 in the last 168 hours CBC:  Lab 03/12/12 0530 03/11/12 0550 03/10/12 0510 03/09/12 0500 03/08/12 2045 03/08/12 2028  WBC 6.4 5.9 6.0 5.4 -- 7.2  NEUTROABS -- -- -- -- -- --  HGB 10.9* 9.8* 9.9* 9.6* 10.5* --  HCT 32.9* 29.8* 29.5* 28.7* 31.0* --  MCV 92.7 91.4 93.7 93.2 -- 91.7  PLT 227 209 207 199 -- 196   Cardiac Enzymes:  Lab 03/08/12 2028  CKTOTAL 84  CKMB --  CKMBINDEX --  TROPONINI --   BNP: No components found with this basename: POCBNP:5 CBG:  Lab 03/12/12 1156 03/12/12 0820 03/11/12 2115 03/11/12 1700 03/11/12 1141  GLUCAP 102* 89 96 82 130*    Recent Results (from the past 240 hour(s))  URINE CULTURE     Status: Normal   Collection Time   03/09/12  8:15 AM      Component Value Range Status Comment   Specimen Description URINE, CLEAN CATCH   Final    Special Requests NONE   Final    Culture  Setup Time 03/09/2012 16:24   Final    Colony Count 85,000 COLONIES/ML   Final    Culture     Final    Value: Multiple bacterial morphotypes present, none predominant. Suggest appropriate recollection if clinically indicated.   Report Status 03/10/2012 FINAL   Final      Scheduled Meds:   . amLODipine  5 mg Oral Daily  . atorvastatin  40 mg Oral Daily  .  budesonide-formoterol  2 puff Inhalation BID  . cefTRIAXone (ROCEPHIN)  IV  1 g Intravenous Q24H  . docusate sodium  200 mg Oral NOW  . DULoxetine  60 mg Oral Daily  . enoxaparin (LOVENOX) injection  30 mg Subcutaneous Q24H  . ferumoxytol  510 mg Intravenous Q3 days  . fluticasone  2 spray Each Nare Daily  . glimepiride  1 mg Oral QAC breakfast  . insulin aspart  0-5 Units Subcutaneous QHS  . insulin aspart  0-9 Units Subcutaneous TID WC  . levothyroxine  112 mcg Oral Q0600  . montelukast  10 mg Oral Daily  . nicotine  21 mg Transdermal Daily  . pantoprazole  40 mg Oral Q1200  . pregabalin  200 mg Oral Daily  . DISCONTD: ferrous sulfate  325 mg Oral BID WC   Continuous Infusions:   . DISCONTD: sodium chloride  75 mL/hr at 03/12/12 0600     Quirino Kakos, MD  [facility] Pager [phone]  If 7PM-7AM, please contact night-coverage [email or web address] Password [email or web address] 03/12/2012, 2:48 PM   LOS: 4 days

## 2012-03-13 ENCOUNTER — Encounter (HOSPITAL_COMMUNITY)
Admission: EM | Disposition: A | Discharge status: Skilled Nursing Facility | Payer: Self-pay | Source: Home / Self Care | Attending: Internal Medicine

## 2012-03-13 ENCOUNTER — Encounter (HOSPITAL_COMMUNITY): Payer: Self-pay | Admitting: Anesthesiology

## 2012-03-13 ENCOUNTER — Inpatient Hospital Stay (HOSPITAL_COMMUNITY): Payer: Medicare Other | Admitting: Anesthesiology

## 2012-03-13 DIAGNOSIS — R300 Dysuria: Secondary | ICD-10-CM

## 2012-03-13 DIAGNOSIS — N289 Disorder of kidney and ureter, unspecified: Secondary | ICD-10-CM

## 2012-03-13 HISTORY — PX: AV FISTULA PLACEMENT: SHX1204

## 2012-03-13 LAB — RENAL FUNCTION PANEL
CO2: 22 mEq/L (ref 19–32)
Calcium: 8.8 mg/dL (ref 8.4–10.5)
Creatinine, Ser: 3.44 mg/dL — ABNORMAL HIGH (ref 0.50–1.10)
GFR calc non Af Amer: 12 mL/min — ABNORMAL LOW (ref >90)
Glucose, Bld: 68 mg/dL — ABNORMAL LOW (ref 70–99)
Phosphorus: 4.3 mg/dL (ref 2.3–4.6)
Sodium: 138 mEq/L (ref 135–145)

## 2012-03-13 LAB — GLUCOSE, CAPILLARY
Glucose-Capillary: 65 mg/dL — ABNORMAL LOW (ref 70–99)
Glucose-Capillary: 114 mg/dL — ABNORMAL HIGH (ref 70–99)
Glucose-Capillary: 66 mg/dL — ABNORMAL LOW (ref 70–99)

## 2012-03-13 LAB — CBC
HCT: 29.3 % — ABNORMAL LOW (ref 36.0–46.0)
Hemoglobin: 10 g/dL — ABNORMAL LOW (ref 12.0–15.0)
MCH: 31 pg (ref 26.0–34.0)
MCHC: 34.1 g/dL (ref 30.0–36.0)
RDW: 15.5 % (ref 11.5–15.5)

## 2012-03-13 SURGERY — ARTERIOVENOUS (AV) FISTULA CREATION
Anesthesia: General | Site: Arm Upper | Laterality: Right | Wound class: Clean

## 2012-03-13 MED ORDER — PHENYLEPHRINE HCL 10 MG/ML IJ SOLN
INTRAMUSCULAR | Status: DC | PRN
  Administered 2012-03-13 (×3): 80 ug via INTRAVENOUS

## 2012-03-13 MED ORDER — SODIUM CHLORIDE 0.9 % IR SOLN
Status: DC | PRN
  Administered 2012-03-13: 16:00:00

## 2012-03-13 MED ORDER — MIDAZOLAM HCL 2 MG/2ML IJ SOLN: 1.0000 mg | INTRAMUSCULAR | Status: DC | PRN

## 2012-03-13 MED ORDER — PROMETHAZINE HCL 25 MG/ML IJ SOLN: 6.2500 mg | INTRAMUSCULAR | Status: DC | PRN

## 2012-03-13 MED ORDER — FENTANYL CITRATE 0.05 MG/ML IJ SOLN: 50.0000 ug | INTRAMUSCULAR | Status: DC | PRN

## 2012-03-13 MED ORDER — CHLORHEXIDINE GLUCONATE CLOTH 2 % EX PADS
6.0000 | MEDICATED_PAD | Freq: Every day | CUTANEOUS | Status: DC
  Administered 2012-03-13 – 2012-03-14 (×2): 6 via TOPICAL

## 2012-03-13 MED ORDER — LIDOCAINE HCL (CARDIAC) 20 MG/ML IV SOLN
INTRAVENOUS | Status: DC | PRN
  Administered 2012-03-13: 80 mg via INTRAVENOUS

## 2012-03-13 MED ORDER — SODIUM CHLORIDE 0.9 % IV SOLN
INTRAVENOUS | Status: DC
  Administered 2012-03-13: 15:00:00 via INTRAVENOUS

## 2012-03-13 MED ORDER — WHITE PETROLATUM GEL
Status: AC
  Administered 2012-03-13: 20:00:00
  Filled 2012-03-13: qty 5

## 2012-03-13 MED ORDER — DEXTROSE 50 % IV SOLN
INTRAVENOUS | Status: AC
  Administered 2012-03-13: 50 mL
  Filled 2012-03-13: qty 50

## 2012-03-13 MED ORDER — DEXTROSE 50 % IV SOLN
25.0000 mL | Freq: Once | INTRAVENOUS | Status: AC
  Administered 2012-03-13: 13:00:00 via INTRAVENOUS
  Filled 2012-03-13: qty 50

## 2012-03-13 MED ORDER — DEXTROSE 50 % IV SOLN
INTRAVENOUS | Status: AC
  Filled 2012-03-13: qty 50

## 2012-03-13 MED ORDER — FENTANYL CITRATE 0.05 MG/ML IJ SOLN
25.0000 ug | INTRAMUSCULAR | Status: DC | PRN
  Administered 2012-03-13 (×2): 50 ug via INTRAVENOUS

## 2012-03-13 MED ORDER — FENTANYL CITRATE 0.05 MG/ML IJ SOLN
INTRAMUSCULAR | Status: AC
  Filled 2012-03-13: qty 2

## 2012-03-13 MED ORDER — 0.9 % SODIUM CHLORIDE (POUR BTL) OPTIME
TOPICAL | Status: DC | PRN
  Administered 2012-03-13: 1000 mL

## 2012-03-13 MED ORDER — PROPOFOL 10 MG/ML IV EMUL
INTRAVENOUS | Status: DC | PRN
  Administered 2012-03-13: 120 mg via INTRAVENOUS

## 2012-03-13 MED ORDER — CEFAZOLIN SODIUM 1-5 GM-% IV SOLN
INTRAVENOUS | Status: DC | PRN
  Administered 2012-03-13: 1 g via INTRAVENOUS

## 2012-03-13 MED ORDER — CEFAZOLIN SODIUM 1-5 GM-% IV SOLN
INTRAVENOUS | Status: AC
  Filled 2012-03-13: qty 50

## 2012-03-13 MED ORDER — MUPIROCIN 2 % EX OINT
1.0000 "application " | TOPICAL_OINTMENT | Freq: Two times a day (BID) | CUTANEOUS | Status: DC
  Administered 2012-03-13 – 2012-03-14 (×3): 1 via NASAL
  Filled 2012-03-13 (×2): qty 22

## 2012-03-13 MED ORDER — FUROSEMIDE 80 MG PO TABS
80.0000 mg | ORAL_TABLET | Freq: Every day | ORAL | Status: DC
  Administered 2012-03-13 – 2012-03-14 (×2): 80 mg via ORAL
  Filled 2012-03-13 (×2): qty 1

## 2012-03-13 SURGICAL SUPPLY — 39 items
ADH SKN CLS APL DERMABOND .7 (GAUZE/BANDAGES/DRESSINGS) ×2
CANISTER SUCTION 2500CC (MISCELLANEOUS) ×3 IMPLANT
CLIP TI MEDIUM 6 (CLIP) ×3 IMPLANT
CLIP TI WIDE RED SMALL 6 (CLIP) ×3 IMPLANT
CLOTH BEACON ORANGE TIMEOUT ST (SAFETY) ×3 IMPLANT
COVER SURGICAL LIGHT HANDLE (MISCELLANEOUS) ×3 IMPLANT
DECANTER SPIKE VIAL GLASS SM (MISCELLANEOUS) ×3 IMPLANT
DERMABOND ADVANCED (GAUZE/BANDAGES/DRESSINGS) ×1
DERMABOND ADVANCED .7 DNX12 (GAUZE/BANDAGES/DRESSINGS) ×2 IMPLANT
ELECT REM PT RETURN 9FT ADLT (ELECTROSURGICAL) ×3
ELECTRODE REM PT RTRN 9FT ADLT (ELECTROSURGICAL) ×2 IMPLANT
GEL ULTRASOUND 20GR AQUASONIC (MISCELLANEOUS) ×2 IMPLANT
GLOVE BIO SURGEON STRL SZ7 (GLOVE) ×5 IMPLANT
GLOVE BIOGEL PI IND STRL 6.5 (GLOVE) ×1 IMPLANT
GLOVE BIOGEL PI IND STRL 7.0 (GLOVE) ×1 IMPLANT
GLOVE BIOGEL PI IND STRL 7.5 (GLOVE) ×3 IMPLANT
GLOVE BIOGEL PI INDICATOR 6.5 (GLOVE) ×1
GLOVE BIOGEL PI INDICATOR 7.0 (GLOVE) ×1
GLOVE BIOGEL PI INDICATOR 7.5 (GLOVE) ×2
GLOVE SURG SS PI 7.0 STRL IVOR (GLOVE) ×2 IMPLANT
GOWN STRL NON-REIN LRG LVL3 (GOWN DISPOSABLE) ×8 IMPLANT
GOWN STRL REIN XL XLG (GOWN DISPOSABLE) ×4 IMPLANT
KIT BASIN OR (CUSTOM PROCEDURE TRAY) ×3 IMPLANT
KIT ROOM TURNOVER OR (KITS) ×3 IMPLANT
NS IRRIG 1000ML POUR BTL (IV SOLUTION) ×3 IMPLANT
PACK CV ACCESS (CUSTOM PROCEDURE TRAY) ×3 IMPLANT
PAD ARMBOARD 7.5X6 YLW CONV (MISCELLANEOUS) ×6 IMPLANT
SPONGE SURGIFOAM ABS GEL 100 (HEMOSTASIS) IMPLANT
SUT MNCRL AB 4-0 PS2 18 (SUTURE) ×3 IMPLANT
SUT PROLENE 5 0 C 1 24 (SUTURE) IMPLANT
SUT PROLENE 6 0 BV (SUTURE) ×6 IMPLANT
SUT PROLENE 7 0 BV 1 (SUTURE) ×4 IMPLANT
SUT SILK 2 0 FS (SUTURE) ×3 IMPLANT
SUT VIC AB 3-0 SH 27 (SUTURE) ×6
SUT VIC AB 3-0 SH 27X BRD (SUTURE) ×4 IMPLANT
TOWEL OR 17X24 6PK STRL BLUE (TOWEL DISPOSABLE) ×3 IMPLANT
TOWEL OR 17X26 10 PK STRL BLUE (TOWEL DISPOSABLE) ×3 IMPLANT
UNDERPAD 30X30 INCONTINENT (UNDERPADS AND DIAPERS) ×3 IMPLANT
WATER STERILE IRR 1000ML POUR (IV SOLUTION) ×3 IMPLANT

## 2012-03-13 NOTE — Progress Notes (Signed)
Visited patient on leadership rounds. Patient states she is "scared" because she is scheduled for HD access placement today. Sat with patient and listened to patients concerns at lenght. Patient stated that "everytime I have surgery something goes wrong". Offered to call the Chaplain to speak with patient. Patient declined. Ensured patient that while everyones response to surgery is different, she would be provided with quality care. Patient voiced questions about when she could eat. Called and spoke with OR associate who indicated that patient would be taken to OR after lunch d/t an emergent case. Patient informed that she could not eat until after surgery and verbalized understanding. Patient was very thankful for the visit and the opportunity to voice her concerns. Patient provided with DD contact information and given permission to contact when needed.  Sherian Valenza, Charlyne Quale

## 2012-03-13 NOTE — Op Note (Addendum)
OPERATIVE NOTE   PROCEDURE: 1. right first stage basilic vein transposition (brachiobasilic arteriovenous fistula) placement  PRE-OPERATIVE DIAGNOSIS: chronic kidney disease stage V   POST-OPERATIVE DIAGNOSIS: same as above   SURGEON: Leonides Sake, MD  ASSISTANT(S): Lianne Cure, PAC   ANESTHESIA: general  ESTIMATED BLOOD LOSS: 30 cc  FINDING(S): 1. Weakly palpable thrill in basilic vein transposition (due to SBP 70-80) 2. Doppler signals in basilic vein transposition consistent with widely patent fistula 3. Dopplerable ulnar and radial signals at end of case 4. Small brachial vein: 2.5 mm 5. Basilic vein: 3 mm  SPECIMEN(S):  none  INDICATIONS:   [name] [name] is a 71 y.o. female who presents with chronic kidney disease stage V .  The patient is scheduled for left first stage basilic vein transposition.  The patient is aware the risks include but are not limited to: bleeding, infection, steal syndrome, nerve damage, ischemic monomelic neuropathy, failure to mature, and need for additional procedures.  The patient is aware of the risks of the procedure and elects to proceed forward.  DESCRIPTION: After full informed written consent was obtained from the patient, the patient was brought back to the operating room and placed supine upon the operating table.  Prior to induction, the patient received IV antibiotics.   After obtaining adequate anesthesia, the patient was then prepped and draped in the standard fashion for a left arm access procedure.  I turned my attention first to identifying the patient's basilic vein and brachial artery.  Using SonoSite guidance, the location of these vessels were marked out on the skin.   I made a longitudinal incision just proximal to the antecubitum and dissected through the subcutaneous tissue and fascia to gain exposure of the brachial artery.  This was noted to be 2.5 mm in diameter externally.  This was dissected out proximally and distally and  controlled with vessel loops .  I then dissected out the basilic vein.  This was noted to be 3 mm in diameter externally.  The distal segment of the vein was ligated with a  2-0 silk, and the vein was transected.  The proximal segment was iinterrogated with serial dilators.  The vein accepted up to a 4 mm dilator without any difficulty.  I then instilled the heparinized saline into the vein and clamped it.  At this point, I reset my exposure of the brachial artery and placed the artery under tension proximally and distally.  I made an arteriotomy with a #11 blade, and then I extended the arteriotomy with a Potts scissor.  I injected heparinized saline proximal and distal to this arteriotomy.  The vein was then sewn to the artery in an end-to-side configuration with a running stitch of 7-0 Prolene.  Prior to completing this anastomosis, I allowed the vein and artery to backbleed.  There was no evidence of clot from any vessels.  I completed the anastomosis in the usual fashion and then released all vessel loops and clamps.  There was a weakly palpable thrill in the venous outflow, and there were dopplerable ulnar and radial signals.  I was not concerned with the poor thrill as the SBP was only 70-80s.  I expect this thrill to improve as the blood pressure improves.  Compression of the venous outflow confirmed the anastomosis was to the brachial artery.  At this point, I irrigated out the surgical wound.  There was no further active bleeding.  The subcutaneous tissue was reapproximated with a running stitch of 3-0 Vicryl.  The skin was then reapproximated with a running subcuticular stitch of 4-0 Vicryl.  The skin was then cleaned, dried, and reinforced with Dermabond.  The patient tolerated this procedure well.   COMPLICATIONS: none  CONDITION: stable  Leonides Sake, MD [facility] Office: [phone] Pager: [phone]  03/13/2012, 4:15 PM

## 2012-03-13 NOTE — Anesthesia Preprocedure Evaluation (Addendum)
Anesthesia Evaluation  Patient identified by MRN, date of birth, ID band Patient awake    Reviewed: Allergy & Precautions, H&P , NPO status , Patient's Chart, lab work & pertinent test results  Airway Mallampati: I TM Distance: >3 FB Neck ROM: Full    Dental   Pulmonary COPDCurrent Smoker,  + rhonchi         Cardiovascular hypertension,     Neuro/Psych  Headaches, Seizures -,  Anxiety Depression    GI/Hepatic PUD, GERD-  ,  Endo/Other  Hypothyroidism   Renal/GU Renal disease     Musculoskeletal   Abdominal   Peds  Hematology   Anesthesia Other Findings   Reproductive/Obstetrics                          Anesthesia Physical Anesthesia Plan  ASA: III  Anesthesia Plan: General   Post-op Pain Management:    Induction: Intravenous  Airway Management Planned: LMA  Additional Equipment:   Intra-op Plan:   Post-operative Plan: Extubation in OR  Informed Consent: I have reviewed the patients History and Physical, chart, labs and discussed the procedure including the risks, benefits and alternatives for the proposed anesthesia with the patient or authorized representative who has indicated his/her understanding and acceptance.     Plan Discussed with: CRNA and Surgeon  Anesthesia Plan Comments:         Anesthesia Quick Evaluation

## 2012-03-13 NOTE — Anesthesia Procedure Notes (Signed)
Procedure Name: LMA Insertion Date/Time: 03/13/2012 3:15 PM Performed by: Arlice Colt B Pre-anesthesia Checklist: Patient identified, Emergency Drugs available, Suction available, Patient being monitored and Timeout performed Patient Re-evaluated:Patient Re-evaluated prior to inductionOxygen Delivery Method: Circle system utilized Preoxygenation: Pre-oxygenation with 100% oxygen Intubation Type: IV induction LMA: LMA inserted LMA Size: 4.0 Number of attempts: 1 Placement Confirmation: positive ETCO2 and breath sounds checked- equal and bilateral Tube secured with: Tape Dental Injury: Teeth and Oropharynx as per pre-operative assessment

## 2012-03-13 NOTE — Progress Notes (Signed)
Nephrology Service Daily Progress Note  Subjective:  The patient had an episode of hypoglycemia to 53 at 6pm yesterday, and glimepiride was discontinued.  The patient's Cr is still below baseline, with good UOP.  Patient scheduled for last dose of feraheme today.  Patient to go for AV fistula placement this morning.  Objective Vital signs in last 24 hours: Filed Vitals:   03/12/12 1723 03/12/12 1805 03/12/12 2147 03/13/12 0534  BP: 175/91 153/72 147/71 152/76  Pulse: 93 86 83 79  Temp: 98.4 F (36.9 C)  98.2 F (36.8 C) 98.1 F (36.7 C)  TempSrc: Oral  Oral Oral  Resp: 18  18 18   Height:      Weight:   187 lb 2.7 oz (84.9 kg)   SpO2: 100%  97% 97%   Weight change:   Intake/Output Summary (Last 24 hours) at 03/13/12 0656 Last data filed at 03/13/12 0300  Gross per 24 hour  Intake   1440 ml  Output   2775 ml  Net  -1335 ml  UOP: 2775  Physical Exam:   General: alert, cooperative, and in no apparent distress HEENT: pupils equal round and reactive to light, vision grossly intact, oropharynx clear and non-erythematous  Neck: supple, no lymphadenopathy, no JVD Lungs: mild ronchi in R lung base, normal work of respiration, no wheezes, rales, ronchi Heart: regular rate and rhythm, no murmurs, gallops, or rubs Abdomen: soft, non-tender, non-distended, normal bowel sounds  Extremities: bilateral non-pitting edema improved since admission Neurologic: alert & oriented X3, cranial nerves II-XII intact, strength grossly intact, sensation intact to light touch  Labs: Basic Metabolic Panel:  Lab 03/13/12 4540 03/12/12 0530 03/11/12 0550 03/10/12 0510 03/09/12 1344 03/09/12 0500 03/08/12 2045  NA 138 138 142 139 -- 141 140  K 5.0 4.6 4.7 4.4 -- 4.0 4.2  CL 107 105 111 105 -- 103 107  CO2 22 22 21 24  -- 27 --  GLUCOSE 68* 118* 73 77 -- 149* 104*  BUN 49* 48* 50* 57* -- 63* 63*  CREATININE 3.44* 3.57* 3.70* 4.42* -- 5.08* 5.40*  ALB -- -- -- -- -- -- --  CALCIUM 8.8 8.9 8.6 8.2* --  8.5 --  PHOS 4.3 4.2 3.7 5.2* 4.9* -- --   Liver Function Tests:  Lab 03/13/12 0510 03/12/12 0530 03/11/12 0550 03/09/12 1344  AST -- -- -- 14  ALT -- -- -- 9  ALKPHOS -- -- -- 66  BILITOT -- -- -- 0.1*  PROT -- -- -- 6.8  ALBUMIN 2.8* 3.1* 2.8* --   CBC:  Lab 03/13/12 0510 03/12/12 0530 03/11/12 0550 03/10/12 0510  WBC 6.2 6.4 5.9 6.0  NEUTROABS -- -- -- --  HGB 10.0* 10.9* 9.8* 9.9*  HCT 29.3* 32.9* 29.8* 29.5*  MCV 90.7 92.7 91.4 93.7  PLT 234 227 209 207   Cardiac Enzymes:  Lab 03/08/12 2028  CKTOTAL 84  CKMB --  CKMBINDEX --  TROPONINI --   CBG:  Lab 03/12/12 2151 03/12/12 1807 03/12/12 1721 03/12/12 1156 03/12/12 0820  GLUCAP 97 107* 53* 102* 89    Iron Studies:   Lab 03/09/12 0500  IRON 33*  TIBC 229*  TRANSFERRIN --  FERRITIN 315*  Saturation ratio: 14  Outside Labs - from [facility], 01/23/12 Creatinine: 4.15  Urine culture: 85K colonies of multiple organisms  Medications: Scheduled Meds:    . amLODipine  5 mg Oral Daily  . atorvastatin  40 mg Oral Daily  . budesonide-formoterol  2 puff Inhalation  BID  . ciprofloxacin  250 mg Oral Q24H  . DULoxetine  60 mg Oral Daily  . enoxaparin (LOVENOX) injection  30 mg Subcutaneous Q24H  . ferumoxytol  510 mg Intravenous Q3 days  . fluticasone  2 spray Each Nare Daily  . insulin aspart  0-5 Units Subcutaneous QHS  . insulin aspart  0-9 Units Subcutaneous TID WC  . levothyroxine  112 mcg Oral Q0600  . montelukast  10 mg Oral Daily  . nicotine  21 mg Transdermal Daily  . pantoprazole  40 mg Oral Q1200  . pregabalin  200 mg Oral Daily  . DISCONTD: cefTRIAXone (ROCEPHIN)  IV  1 g Intravenous Q24H  . DISCONTD: glimepiride  1 mg Oral QAC breakfast   PRN Meds:.acetaminophen, acetaminophen, albuterol, calcium carbonate (dosed in mg elemental calcium), clonazePAM, cyclobenzaprine, docusate sodium, HYDROmorphone (DILAUDID) injection, hydrOXYzine, ondansetron (ZOFRAN) IV, ondansetron, oxyCODONE,  promethazine, traZODone, DISCONTD: albuterol  I  have reviewed scheduled and prn medications.  Assessment/Plan:  The patient is a 71 yo woman, history of CKD stage 5 (followed by Dr. Lowell Guitar, baseline Cr ~ 4.0) with no AV fistula, COPD, DM2, HL, HTN, hypothyroidism, osteopenia, presenting with weakness and falls, found to have ARF with UA evidence of UTI, also with a new right upper pole kidney mass.   # Acute on Chronic Kidney Disease - history of CKD stage 5, baseline Cr ~ 4.0, presenting with Cr = 5.4, now below baseline with IVF. (last Cr = 4.13 on 01/23/12), likely prerenal in etiology.  If nephrectomy is needed, the patient will almost certainly need to start dialysis.  Patient agrees to AV fistula. -urine culture showed likely contamination -antibiotics for ?UTI per primary team, currently day 5/5, consider stopping after today -follow daily renal function panel  -plan for AV fistula today  # Right Renal Lesion - 3.5x3.7x3.9 cm lesion noted on the upper pole of the right kidney, concerning for mass. Patient noted to have anemia, but notes no hematuria, flank pain, palpable mass on examination, weight loss, or night sweats. Urology consulted, and recommends percutaneous biopsy, which could be done during this admission. -greatly appreciate Urology consult -plan for percutaneous biopsy, Urology to present at review board Friday before proceeding  # Anemia of Chronic Disease - the patient has a history of anemia, likely secondary to anemia of chronic disease, with a baseline Hb of 11-12 in Jan 2013.  Anemia panel shows saturation ratio of 14. -s/p Feraheme 8/5, repeat 8/8 -follow daily CBC's  # Diabetes Mellitus - A1C = 7.2 -per primary team   # COPD - patient notes cough and subjective "wheezing"  -per primary team, scheduled nebs, symbicort, ceftriaxone   # Weakness - unclear etiology, possibly UTI vs deconditioning vs likely cancer   -per primary team  # Hypothyroidism - chronic,  stable   # Hyperlipidemia  -per primary team, continue statin   Janalyn Harder, PGY2 Pgr. [phone] 03/13/2012, 6:56 AM I have seen and examined this patient and agree with plan by Dr Manson Passey.  Renal fx stable.  To go for vasc access today.  Note plans for rehab. Will resume lasix at q day.  Will sign off, she can FU with Dr Lowell Guitar as outpt. Kati Riggenbach T,MD 03/13/2012 11:22 AM

## 2012-03-13 NOTE — Clinical Social Work Psychosocial (Deleted)
     Clinical Social Work Department BRIEF PSYCHOSOCIAL ASSESSMENT 03/12/2012  Patient:  MAGHEN, GROUP     Account Number:  [id number]     Admit date:  03/08/2012  Clinical Social Worker:  Delmer Islam  Date/Time:  03/12/2012 05:56 AM  Referred by:  Physician  Date Referred:  03/11/2012 Referred for  SNF Placement   Other Referral:   Interview type:  Patient Other interview type:    PSYCHOSOCIAL DATA Living Status:  ALONE Admitted from facility:   Level of care:   Primary support name:   Primary support relationship to patient:   Degree of support available:   Patient mentioned that she has a friend that is supportive.  When asked, patient responded that she does not have any children.    CURRENT CONCERNS Current Concerns  Post-Acute Placement   Other Concerns:    SOCIAL WORK ASSESSMENT / PLAN CSW and RN care manager Drinda Butts talked with patient about discharge plans - home w/HH services vs SNF for short-term rehab. Patient mentioned that the doctors have talked with her about going somewhere to get physical therapy and she is willing to do this.    Patient given SNF list for [facility] and her choices are [facility] and [place].   Assessment/plan status:  Psychosocial Support/Ongoing Assessment of Needs Other assessment/ plan:   Information/referral to community resources:   Patient given SNF list    PATIENTS/FAMILYS RESPONSE TO PLAN OF CARE: Patient was pleasant and receptive to talking with CSW and nurse case manager. She is open to rehab and gave her preferences. Patient's concerns is getting clothing for rehab and picking up food items from [facility] that she gets monthly - 8/11. She was urged to call [facility] to determine if she could make other arrangements.

## 2012-03-13 NOTE — Progress Notes (Signed)
PT Cancellation Note  Treatment cancelled today due to patient receiving procedure or test. Pt with transportation about to go down to the OR for A-V graft. Will attempt treatment tomorrow.  03/13/2012 Milana Kidney DPT PAGER: [phone] OFFICE: [phone]    Milana Kidney 03/13/2012, 2:03 PM

## 2012-03-13 NOTE — Transfer of Care (Signed)
Immediate Anesthesia Transfer of Care Note  Patient: [name] [name]  Procedure(s) Performed: Procedure(s) (LRB): ARTERIOVENOUS (AV) FISTULA CREATION (Right)  Patient Location: PACU  Anesthesia Type: General  Level of Consciousness: awake, alert  and oriented  Airway & Oxygen Therapy: Patient Spontanous Breathing  Post-op Assessment: Report given to PACU RN and Post -op Vital signs reviewed and stable  Post vital signs: Reviewed and stable  Complications: No apparent anesthesia complications

## 2012-03-13 NOTE — H&P (Signed)
  [facility]  History and Physical Update  The patient was interviewed and re-examined.  The patient's previous History and Physical has been reviewed and is unchanged from Dr. Bosie Helper consult on 03/10/12.  There is no change in the plan of care.  Leonides Sake, MD [facility] Office: [phone] Pager: [phone]  03/13/2012, 2:54 PM

## 2012-03-13 NOTE — Anesthesia Postprocedure Evaluation (Signed)
  Anesthesia Post-op Note  Patient: [name] [name]  Procedure(s) Performed: Procedure(s) (LRB): ARTERIOVENOUS (AV) FISTULA CREATION (Right)  Patient Location: PACU  Anesthesia Type: General  Level of Consciousness: awake  Airway and Oxygen Therapy: Patient Spontanous Breathing  Post-op Pain: mild  Post-op Assessment: Post-op Vital signs reviewed  Post-op Vital Signs: stable  Complications: No apparent anesthesia complications

## 2012-03-13 NOTE — Progress Notes (Signed)
[facility] PROGRESS NOTE  [name] [name] ZOX:[id number] DOB: 1940-12-21 DOA: 03/08/2012 PCP: Oliver Barre, MD  *Renal failure (ARF), acute on chronic: Improving, baseline creatinine ~4, today 3.4 - Continue gentle hydration, possibly precipitated due to UTI (urine culture negative so for), dehydration with progression of the chronic kidney disease  - Nephrology started the patient back on furosemide 80 mg by mouth daily - Renal ultrasound showed 3.5 X3 0.7x 3.9 cm renal mass on the right kidney, confirmed with the MRI abdomen without contrast  - Renal service following, recommending continue hydration, patient is agreeable to AV fistula placement. Fistula placement scheduled for August 8 afternoon. - Vascular surgery consulted, renal surgery and vascular surgery to coordinate AV fistula placement during this admission. The patient is scheduled to have AV graft placement on August 8   Right renal lesion: Newly diagnosed in the upper pole of right kidney concerning for tumor/mass  -Urology consulted, appreciate Dr. Hilario Quarry recommendations, plan for percutaneous biopsy during this admission. Dr. Margarita Grizzle is planning to await recommendations from tumor board this Friday before making any further recommendations.  - If she needs a nephrectomy, will definitely be on hemodialysis thereafter.  HYPOTHYROIDISM: Continue Synthroid, TSH 0.35  DIABETES MELLITUS, TYPE II: Controlled inpatient, hemoglobin A1c 7.2  - Continue sliding scale insulin. Discontinue Amaryl at this time.patient has been mildly hypoglycemic due to Amaryl. Her sugars have been running in the mid 60s. The patient was given half an ampule of D50. She is asymptomatic. HYPERLIPIDEMIA: Continue statins  ANEMIA-NOS: Likely anemia of chronic disease, per renal  CIGARETTE SMOKER  - Counseled on smoking cessation  HYPERTENSION: Stable  Generalized weakness:  - Increasing falls likely secondary to UTI and acute renal insufficiency, will  need home health PT  UTI (lower urinary tract infection): -Today is day #5 of antibiotics. Would discontinue ciprofloxacin after today's dose. - Follow urine culture, continue Rocephin, Urine culture with 85,000 colonies  COPD bronchitis: Resolved  -Exam did not reveal any wheezing. The patient was started back on Symbicort and albuterol  - Continue scheduled nebs, Symbicort, Rocephin, O2 supplementation     Consultants: Renal  Urology  Vascular surgery    Antibiotics: Ceftriaxone-8/4-8/7  Ciprofloxacin-8/7-    Code Status: Full Family Communication: Pt at bedside Disposition Plan: Home when medically stable  HPI/Subjective: No events overnight.   Objective: Filed Vitals:   03/12/12 1805 03/12/12 2147 03/13/12 0534 03/13/12 0920  BP: 153/72 147/71 152/76 174/82  Pulse: 86 83 79 85  Temp:  98.2 F (36.8 C) 98.1 F (36.7 C) 98.4 F (36.9 C)  TempSrc:  Oral Oral Oral  Resp:  18 18 18   Height:      Weight:  84.9 kg (187 lb 2.7 oz)    SpO2:  97% 97% 93%    Intake/Output Summary (Last 24 hours) at 03/13/12 1201 Last data filed at 03/13/12 1100  Gross per 24 hour  Intake    720 ml  Output   3400 ml  Net  -2680 ml    Exam:   General:  Pt is alert, follows commands appropriately, not in acute distress  Cardiovascular: Regular rate and rhythm, S1/S2, no murmurs, no rubs, no gallops  Respiratory: Clear to auscultation bilaterally, no wheezing, no crackles, no rhonchi  Abdomen: Soft, non tender, non distended, bowel sounds present, no guarding  Extremities: No edema, pulses DP and PT palpable bilaterally  Neuro: Grossly nonfocal  Data Reviewed: Basic Metabolic Panel:  Lab 03/13/12 8119 03/12/12 0530 03/11/12 0550 03/10/12 0510  03/09/12 1344 03/09/12 0500  NA 138 138 142 139 -- 141  K 5.0 4.6 4.7 4.4 -- 4.0  CL 107 105 111 105 -- 103  CO2 22 22 21 24  -- 27  GLUCOSE 68* 118* 73 77 -- 149*  BUN 49* 48* 50* 57* -- 63*  CREATININE 3.44* 3.57* 3.70* 4.42*  -- 5.08*  CALCIUM 8.8 8.9 8.6 8.2* -- 8.5  MG -- -- -- -- 2.9* --  PHOS 4.3 4.2 3.7 5.2* 4.9* --   Liver Function Tests:  Lab 03/13/12 0510 03/12/12 0530 03/11/12 0550 03/10/12 0510 03/09/12 1344  AST -- -- -- -- 14  ALT -- -- -- -- 9  ALKPHOS -- -- -- -- 66  BILITOT -- -- -- -- 0.1*  PROT -- -- -- -- 6.8  ALBUMIN 2.8* 3.1* 2.8* 3.0* 3.3*   No results found for this basename: LIPASE:5,AMYLASE:5 in the last 168 hours No results found for this basename: AMMONIA:5 in the last 168 hours CBC:  Lab 03/13/12 0510 03/12/12 0530 03/11/12 0550 03/10/12 0510 03/09/12 0500  WBC 6.2 6.4 5.9 6.0 5.4  NEUTROABS -- -- -- -- --  HGB 10.0* 10.9* 9.8* 9.9* 9.6*  HCT 29.3* 32.9* 29.8* 29.5* 28.7*  MCV 90.7 92.7 91.4 93.7 93.2  PLT 234 227 209 207 199   Cardiac Enzymes:  Lab 03/08/12 2028  CKTOTAL 84  CKMB --  CKMBINDEX --  TROPONINI --   BNP: No components found with this basename: POCBNP:5 CBG:  Lab 03/13/12 0814 03/12/12 2151 03/12/12 1807 03/12/12 1721 03/12/12 1156  GLUCAP 64* 97 107* 53* 102*    Recent Results (from the past 240 hour(s))  URINE CULTURE     Status: Normal   Collection Time   03/09/12  8:15 AM      Component Value Range Status Comment   Specimen Description URINE, CLEAN CATCH   Final    Special Requests NONE   Final    Culture  Setup Time 03/09/2012 16:24   Final    Colony Count 85,000 COLONIES/ML   Final    Culture     Final    Value: Multiple bacterial morphotypes present, none predominant. Suggest appropriate recollection if clinically indicated.   Report Status 03/10/2012 FINAL   Final   SURGICAL PCR SCREEN     Status: Abnormal   Collection Time   03/13/12  5:56 AM      Component Value Range Status Comment   MRSA, PCR POSITIVE (*) NEGATIVE Final    Staphylococcus aureus POSITIVE (*) NEGATIVE Final      Scheduled Meds:   . amLODipine  5 mg Oral Daily  . atorvastatin  40 mg Oral Daily  . budesonide-formoterol  2 puff Inhalation BID  . Chlorhexidine  Gluconate Cloth  6 each Topical Q0600  . ciprofloxacin  250 mg Oral Q24H  . DULoxetine  60 mg Oral Daily  . enoxaparin (LOVENOX) injection  30 mg Subcutaneous Q24H  . ferumoxytol  510 mg Intravenous Q3 days  . fluticasone  2 spray Each Nare Daily  . furosemide  80 mg Oral Daily  . insulin aspart  0-5 Units Subcutaneous QHS  . insulin aspart  0-9 Units Subcutaneous TID WC  . levothyroxine  112 mcg Oral Q0600  . montelukast  10 mg Oral Daily  . mupirocin ointment  1 application Nasal BID  . nicotine  21 mg Transdermal Daily  . pantoprazole  40 mg Oral Q1200  . pregabalin  200 mg Oral Daily  .  DISCONTD: cefTRIAXone (ROCEPHIN)  IV  1 g Intravenous Q24H  . DISCONTD: glimepiride  1 mg Oral QAC breakfast   Continuous Infusions:    Adyan Palau, MD  [facility] Pager [phone]  If 7PM-7AM, please contact night-coverage [email or web address] Password [email or web address] 03/13/2012, 12:01 PM   LOS: 5 days

## 2012-03-13 NOTE — Clinical Social Work Note (Signed)
Patient not medically stable for discharge today. Her SNF preference [facility] made a bed offer and per TC with Crystal in admissions today, they can take patient when she is medically ready. Her 2nd choice - [facility] also made a bed offer.   Patient's signed FL-2 is in the front of shadow chart.  Genelle Bal, MSW, LCSW [phone]

## 2012-03-14 ENCOUNTER — Telehealth: Payer: Self-pay | Admitting: Vascular Surgery

## 2012-03-14 ENCOUNTER — Encounter (HOSPITAL_COMMUNITY): Payer: Self-pay | Admitting: Vascular Surgery

## 2012-03-14 DIAGNOSIS — E119 Type 2 diabetes mellitus without complications: Secondary | ICD-10-CM

## 2012-03-14 LAB — GLUCOSE, CAPILLARY
Glucose-Capillary: 77 mg/dL (ref 70–99)
Glucose-Capillary: 83 mg/dL (ref 70–99)
Glucose-Capillary: 134 mg/dL — ABNORMAL HIGH (ref 70–99)

## 2012-03-14 LAB — CBC
Hemoglobin: 9.7 g/dL — ABNORMAL LOW (ref 12.0–15.0)
MCV: 92.4 fL (ref 78.0–100.0)
Platelets: 235 10*3/uL (ref 150–400)
RBC: 3.14 MIL/uL — ABNORMAL LOW (ref 3.87–5.11)
WBC: 7.1 10*3/uL (ref 4.0–10.5)

## 2012-03-14 LAB — RENAL FUNCTION PANEL
Albumin: 2.9 g/dL — ABNORMAL LOW (ref 3.5–5.2)
BUN: 51 mg/dL — ABNORMAL HIGH (ref 6–23)
CO2: 20 mEq/L (ref 19–32)
Chloride: 109 mEq/L (ref 96–112)
Potassium: 5.4 mEq/L — ABNORMAL HIGH (ref 3.5–5.1)

## 2012-03-14 MED ORDER — CALCIUM CARBONATE 1250 MG/5ML PO SUSP: 500.0000 mg | Freq: Four times a day (QID) | ORAL | Status: DC | PRN

## 2012-03-14 MED ORDER — DSS 100 MG PO CAPS: 100.0000 mg | ORAL_CAPSULE | Freq: Two times a day (BID) | ORAL | Status: AC | PRN

## 2012-03-14 MED ORDER — FUROSEMIDE 80 MG PO TABS: 80.0000 mg | ORAL_TABLET | Freq: Every day | ORAL | Status: DC

## 2012-03-14 MED ORDER — SODIUM POLYSTYRENE SULFONATE 15 GM/60ML PO SUSP
15.0000 g | Freq: Once | ORAL | Status: AC
  Administered 2012-03-14: 15 g via ORAL
  Filled 2012-03-14: qty 60

## 2012-03-14 MED ORDER — OXYCODONE HCL 5 MG PO TABS: 5.0000 mg | ORAL_TABLET | Freq: Four times a day (QID) | ORAL | Status: AC | PRN

## 2012-03-14 NOTE — Telephone Encounter (Addendum)
Message copied by Rosalyn Charters on Fri Mar 14, 2012  9:59 AM ------      Message from: Phillips Odor      Created: Fri Mar 14, 2012  9:17 AM                   ----- Message -----         From: Fransisco Hertz, MD         Sent: 03/13/2012   4:21 PM           To: Reuel Derby, Melene Plan, RN            Cipriana Biller Peto      [id number]      January 29, 1941            PROCEDURE:      right first stage basilic vein transposition (brachiobasilic arteriovenous fistula) placement            Follow-up: 4 wk            notified pt. of fu appt. with dr. Imogene Burn on 04-11-12 3:30 unable to reach pt. by phone mailed appt. letter

## 2012-03-14 NOTE — Progress Notes (Signed)
Physical Therapy Treatment Patient Details Name: [name] [name] MRN: [id number] DOB: June 28, 1941 Today's Date: 03/14/2012 Time: 4098-1191 PT Time Calculation (min): 18 min  PT Assessment / Plan / Recommendation Comments on Treatment Session  Balance and history of falls remains a concern.  Agree withconsideration of  ST SNF for rehab.    Follow Up Recommendations  Home health PT;Skilled nursing facility;Supervision/Assistance - 24 hour    Barriers to Discharge        Equipment Recommendations  Defer to next venue    Recommendations for Other Services OT consult  Frequency Min 3X/week   Plan Discharge plan needs to be updated;Frequency remains appropriate    Precautions / Restrictions Precautions Precautions: Fall Restrictions Weight Bearing Restrictions: No   Pertinent Vitals/Pain No pain, no distress    Mobility  Bed Mobility Bed Mobility: Not assessed Transfers Transfers: Sit to Stand;Stand to Sit Sit to Stand: 5: Supervision;From bed;With upper extremity assist;From toilet Stand to Sit: 5: Supervision;To chair/3-in-1;To toilet Details for Transfer Assistance: Able to control descent to toilet and to recliner today.  Uses hands appropriately Ambulation/Gait Ambulation/Gait Assistance: 4: Min guard Ambulation Distance (Feet): 200 Feet Assistive device: None Ambulation/Gait Assistance Details: Pt. wanted to attempt ambulation without device today.  She has slight unsteadiness without device and needed min guard assist for safety. Gait Pattern: Step-through pattern Gait velocity: decreased Stairs: No Wheelchair Mobility Wheelchair Mobility: No    Exercises     PT Diagnosis:    PT Problem List:   PT Treatment Interventions:     PT Goals Acute Rehab PT Goals PT Goal: Sit to Stand - Progress: Progressing toward goal PT Goal: Stand to Sit - Progress: Progressing toward goal PT Goal: Ambulate - Progress: Progressing toward goal  Visit Information  Last PT Received  On: 03/14/12 Assistance Needed: +1    Subjective Data  Subjective: reports sehe does well walking straight ahead but often loses balance in truns, stepping over objuect   Cognition  Overall Cognitive Status: Appears within functional limits for tasks assessed/performed Arousal/Alertness: Awake/alert Orientation Level: Appears intact for tasks assessed Behavior During Session: [facility] for tasks performed    Balance     End of Session PT - End of Session Equipment Utilized During Treatment: Gait belt Activity Tolerance: Patient tolerated treatment well Patient left: in chair;with call bell/phone within reach Nurse Communication: Mobility status   GP     Ferman Hamming 03/14/2012, 9:59 AM Weldon Picking PT Acute Rehab Services [phone] [facility] [phone]

## 2012-03-14 NOTE — Progress Notes (Signed)
Nutrition Brief Note  RD followed up with pt today for education. Pt states that she denies any questions or concerns at this time.  Current diet order is Renal 80-90, patient is consuming approximately 100% of meals at this time. Labs and medications reviewed.   No nutrition interventions warranted at this time. If nutrition issues arise, please re-consult RD.   Jarold Motto MS, RD, LDN Pager: [phone] After-hours pager: [phone]

## 2012-03-14 NOTE — Progress Notes (Signed)
GU  The patient was discharged before I could discuss the recommendations of the GU Cancer Conference with her. I called the SNF where she was transferred and relayed the recommendations. These were to avoid renal mass biopsy at this time and follow the mass over time. We discussed the alternatives and the risks & benefits of each approach. I am going to have her follow in my clinic in 3-4 weeks to discuss this further to ensure she understands the plan.

## 2012-03-14 NOTE — Discharge Summary (Signed)
Physician Discharge Summary  [name] [name] ZOX:[id number] DOB: 05-10-1941 DOA: 03/08/2012  PCP: Oliver Barre, MD  Admit date: 03/08/2012 Discharge date: 03/14/2012  Recommendations for Outpatient Follow-up:  1. Pt will need to follow up with PCP in 2-3 weeks post discharge 2. Please obtain BMP to evaluate electrolytes and kidney function 3. Please also check CBC to evaluate Hg and Hct levels 4. Follow up with urology, Dr. Margarita Grizzle in 2 months,  5. Follow up with nephrology in 1 month 6. Follow up with vascular surgery, Dr. Imogene Burn, 6weeks.  Discharge Diagnoses:   *Renal failure (ARF), acute on chronic: Improving, baseline creatinine 3.5-4, today 3.6 -Likely etiology was volume depletion - Nephrology started the patient back on furosemide 80 mg by mouth daily  - Renal ultrasound showed 3.5 X3 0.7x 3.9 cm renal mass on the right kidney, confirmed with the MRI abdomen without contrast  - Patient had an AV fistula created on 03/13/2012 by Dr. Imogene Burn  Right renal mass  -Spoke with Dr. Margarita Grizzle on the day of discharge. He states that there are no immediate plans for intervention at this time. He recommended followup in his office and serial imaging. - If she needs a nephrectomy, may be on hemodialysis thereafter.  HYPOTHYROIDISM: Continue Synthroid, TSH 0.35  DIABETES MELLITUS, TYPE II: Controlled inpatient, hemoglobin A1c 7.2  -Patient had a 2 episodes of mild hypoglycemia most likely due to for Amaryl. This improved with discontinuation of Amaryl HYPERLIPIDEMIA: Continue statins  ANEMIA-NOS: Likely anemia of chronic disease, per renal  CIGARETTE SMOKER  - Counseled on smoking cessation  HYPERTENSION: Stable  Deconditioning: -The patient has agreed to a short stay at a skilled nursing facility - Increasing falls likely secondary to UTI and acute renal insufficiency UTI (lower urinary tract infection): -The patient was treated with 5 days of antibiotics, clinically improved - Follow urine culture,  continue Rocephin, Urine culture with 85,000 colonies  COPD: -Exam did not reveal any wheezing. The patient was started back on Symbicort and albuterol  - Continue scheduled nebs, Symbicort, Rocephin, O2 supplementation    Discharge Condition: Stable  Diet recommendation: Heart healthy diet discussed in details   History of present illness:  71 year old female who presented to the emergency department with complaints of increased falls and weakness over the past 2 weeks. She was also found to have acute renal failure with a creatinine of 5.4 and pyuria suggestive of a UTI.  Hospital Course:  The patient was admitted to the renal floor and started on intravenous hydration. Nephrology was consulted. They recommended continued hydration. The patient's renal function continued to improve back to baseline. The patient finished 5 days of antibiotics which were initially intravenous and converted to oral ciprofloxacin. Urine cultures were nondiagnostic showing greater than 3 organisms.  Discharge home on antibiotics. The patient continued to remain weak but continued to work with PT and OT. Recommendation was made for a short stay at a skilled nursing facility. Ultimately, the patient agreed. Vascular surgery also saw the patient and an AV fistula was placed August 8. There were no immediate postoperative complications. Urology saw the patient for her right renal mass that was seen on an ultrasound of her kidneys and ultimately followed up with an MRI of the abdomen. Please see radiographic reports below. The patient was continued on a long acting beta agonist for her COPD. Her Amaryl was discontinued for mild episodes of hypoglycemia for which the patient was asymptomatic. Her sugars improved after discontinuation of Amaryl. On the day of  discharge, it was noted that the patient had mild hyperkalemia of 5.4. However this was likely due to a mild degree of hemolysis on the blood drawn. 15 mg of Kayexalate  was given.   Procedures/Studies: Dg Chest 2 View  03/08/2012  *RADIOLOGY REPORT*  Clinical Data: Weakness and pain.  CHEST - 2 VIEW  Comparison: 02/22/2013from [facility].  Findings: Shallow inspiration with elevation of the left hemidiaphragm.  Heart size and pulmonary vascularity are normal. Focal linear scarring in the left midlung is stable since previous study.  Suggestion of atelectasis or vascular crowding in the right lung base.  No focal consolidation.  No blunting of costophrenic angles.  No pneumothorax.  Calcified and tortuous aorta.  Surgical clips in the base of the neck.  Degenerative changes in the spine and shoulders.  IMPRESSION: Elevation of left hemidiaphragm with linear scarring in the left lung.  Atelectasis versus vascular crowding in the right lung base. Stable appearance since previous study.  Original Report Authenticated By: Marlon Pel, M.D.   Ct Head Wo Contrast  03/08/2012  *RADIOLOGY REPORT*  Clinical Data: Fall with head injury and headache.  CT HEAD WITHOUT CONTRAST  Technique:  Contiguous axial images were obtained from the base of the skull through the vertex without contrast.  Comparison: 08/18/2011 MRI and 08/17/2011 CT.  Findings: Mild atrophy and chronic small vessel white matter ischemic changes again noted.  No acute intracranial abnormalities are identified, including mass lesion or mass effect, hydrocephalus, extra-axial fluid collection, midline shift, hemorrhage, or acute infarction.  The visualized bony calvarium is unremarkable.  IMPRESSION: No evidence of acute intracranial abnormality.  Unchanged atrophy and chronic small vessel white matter ischemic changes.  Original Report Authenticated By: Rosendo Gros, M.D.   Mr Abdomen Wo Contrast  03/10/2012  *RADIOLOGY REPORT*  Clinical Data: Evaluate renal mass on ultrasound  MRI ABDOMEN WITHOUT CONTRAST  Technique:  Multiplanar multisequence MR imaging of the abdomen was performed. No intravenous  contrast was administered.  Comparison: Ultrasound dated 03/09/2012  Findings: 3.6 x 3.3 x 3.1 cm exophytic right upper pole renal lesion, corresponding to the sonographic abnormality.  A portion of the lesion is T1 hyperintense (series 1000/image 23), likely reflecting hemorrhage, with associated decreased signal anteriorly on the in-phase imaging suggesting susceptibility artifact related to hemosiderin deposition.  No restricted diffusion.  No definite macroscopic fat.  Additional bilateral renal cysts.  No hydronephrosis.  Decreased signal in the liver and spleen on in-phase imaging, suggesting iron deposition.  Pancreas and right adrenal gland are within normal limits.  Nodular thickening of the left adrenal gland (series 5/image 12), incompletely characterized.  No abdominal ascites.  No suspicious abdominal lymphadenopathy.  Thoracolumbar scoliosis, without focal osseous lesion.  IMPRESSION: 3.6 cm exophytic right upper pole renal lesion, suspicious for solid renal neoplasm with associated hemorrhage.  Original Report Authenticated By: Charline Bills, M.D.   US Renal  03/09/2012  *RADIOLOGY REPORT*  Clinical Data: Acute renal failure.  History of chronic kidney disease.  Rule out obstruction.  RENAL/URINARY TRACT ULTRASOUND COMPLETE  Comparison:  Abdominal ultrasound 05/21/2005.  Findings:  Right Kidney:  In the upper pole of the right kidney there is a 3.5 x 3.7 x 3.9 cm lesion of heterogeneous echogenicity which is concerning for a solid mass.  This appears have some internal blood flow on color Doppler imaging.  The renal parenchyma is diffusely echogenic.  There is also a small 1.1 x 0.9 by 1.0 cm anechoic lesion in the upper pole  of the right kidney with some associated through transmission, likely representing a small cyst.  No hydronephrosis.  9.1 cm in length.  Left Kidney:  A 1.4 x 1.5 x 1.3 cm anechoic lesion with associated increase through transmission in the interpolar region.  The kidney is  poorly visualized and is somewhat echogenic in appearance.  No hydronephrosis.  9.4 cm in length.  Bladder:  Urinary bladder is moderately distended without focal wall abnormalities.  IMPRESSION: 1.  3.5 x 3.7 x 3.9 cm indeterminate lesion in the upper pole of the right kidney is concerning for a renal mass.  Further evaluation with contrast enhanced MRI is recommended for more definitive characterization after resolution of the patient's acute renal failure. 2.  Additional small cysts in the kidneys bilaterally, as above. 3.  Diffusely echogenic renal parenchyma bilaterally, suggestive of medical renal disease.  Original Report Authenticated By: Florencia Reasons, M.D.     Consultations:  Nephrology  Urology  Vascular surgery  Antibiotics:  Ceftriaxone and ciprofloxacin  Discharge Exam: Filed Vitals:   03/14/12 1412  BP: 139/68  Pulse: 90  Temp: 98.7 F (37.1 C)  Resp: 18   Filed Vitals:   03/14/12 0530 03/14/12 0721 03/14/12 0926 03/14/12 1412  BP: 138/56  151/84 139/68  Pulse: 65  92 90  Temp: 98.5 F (36.9 C)  98.9 F (37.2 C) 98.7 F (37.1 C)  TempSrc: Oral  Oral Oral  Resp: 18  18 18   Height:      Weight:      SpO2: 100% 93% 100% 96%    General: Pt is alert, follows commands appropriately, not in acute distress Cardiovascular: Regular rate and rhythm, S1/S2 +, no murmurs, no rubs, no gallops Respiratory: Clear to auscultation bilaterally, no wheezing, no crackles, no rhonchi Abdominal: Soft, non tender, non distended, bowel sounds +, no guarding Extremities: no edema, no cyanosis, pulses palpable bilaterally DP and PT   Discharge Instructions  Discharge Orders    Future Appointments: Provider: Department: Dept Phone: Center:   03/28/2012 3:15 PM Barbaraann Share, MD [facility] [phone] None   04/11/2012 3:30 PM Fransisco Hertz, MD [facility] [phone] [facility]     Future Orders Please Complete By Expires   Diet - low sodium heart healthy      Increase  activity slowly        Medication List  As of 03/14/2012  2:19 PM   STOP taking these medications         albuterol 108 (90 BASE) MCG/ACT inhaler      cyclobenzaprine 5 MG tablet      glimepiride 1 MG tablet         TAKE these medications         amLODipine 5 MG tablet   Commonly known as: NORVASC   Take 1 tablet (5 mg total) by mouth daily.      atorvastatin 40 MG tablet   Commonly known as: LIPITOR   Take 1 tablet (40 mg total) by mouth daily.      calcium carbonate (dosed in mg elemental calcium) 1250 MG/5ML   Take 5 mLs (500 mg of elemental calcium total) by mouth every 6 (six) hours as needed for heartburn.      clonazePAM 2 MG tablet   Commonly known as: KLONOPIN   TAKE ONE TABLET BY MOUTH TWICE DAILY AS NEEDED FOR ANXIETY      DSS 100 MG Caps   Take 100 mg by mouth 2 (two) times  daily as needed.      DULoxetine 60 MG capsule   Commonly known as: CYMBALTA   Take 60 mg by mouth daily.      ferrous sulfate 325 (65 FE) MG tablet   1 tab by mouth twice per day      fluticasone 50 MCG/ACT nasal spray   Commonly known as: FLONASE   Place 2 sprays into the nose daily.      furosemide 80 MG tablet   Commonly known as: LASIX   Take 1 tablet (80 mg total) by mouth daily.      hydrOXYzine 25 MG tablet   Commonly known as: ATARAX/VISTARIL   TAKE ONE TABLET BY MOUTH EVERY 6 HOURS AS NEEDED FOR NAUSEA OR NERVES      levothyroxine 112 MCG tablet   Commonly known as: SYNTHROID, LEVOTHROID   Take 112 mcg by mouth daily.      mometasone-formoterol 100-5 MCG/ACT Aero   Commonly known as: DULERA   Inhale 2 puffs into the lungs 2 (two) times daily.      montelukast 10 MG tablet   Commonly known as: SINGULAIR   Take 1 tablet (10 mg total) by mouth daily.      omeprazole 40 MG capsule   Commonly known as: PRILOSEC   Take 1 capsule (40 mg total) by mouth daily.      oxyCODONE 5 MG immediate release tablet   Commonly known as: Oxy IR/ROXICODONE   Take 1 tablet (5 mg  total) by mouth every 6 (six) hours as needed.      pregabalin 100 MG capsule   Commonly known as: LYRICA   Take 200 mg by mouth daily.      promethazine 25 MG tablet   Commonly known as: PHENERGAN   Take 25 mg by mouth every 8 (eight) hours as needed. For nausea      traZODone 100 MG tablet   Commonly known as: DESYREL   Take 0.5 tablets (50 mg total) by mouth at bedtime as needed. For sleep           Follow-up Information    Follow up with Milford Cage, MD. Schedule an appointment as soon as possible for a visit in 2 months.   Contact information:   [address] [facility] [place] [address] [phone]       Follow up with Nilda Simmer, MD. Schedule an appointment as soon as possible for a visit in 6 weeks.   Contact information:   [address] [place] [address] [phone]       Follow up with Milford Cage, MD. Schedule an appointment as soon as possible for a visit in 2 months.   Contact information:   [address] [facility] [place] [address] [phone]       Follow up with Dyke Maes, MD. Schedule an appointment as soon as possible for a visit in 1 month.   Contact information:   [address] [place] [address] [phone]           The results of significant diagnostics from this hospitalization (including imaging, microbiology, ancillary and laboratory) are listed below for reference.     Microbiology: Recent Results (from the past 240 hour(s))  URINE CULTURE     Status: Normal   Collection Time   03/09/12  8:15 AM  Component Value Range Status Comment   Specimen Description URINE, CLEAN CATCH   Final    Special Requests NONE   Final    Culture  Setup Time 03/09/2012 16:24   Final    Colony Count 85,000 COLONIES/ML    Final    Culture     Final    Value: Multiple bacterial morphotypes present, none predominant. Suggest appropriate recollection if clinically indicated.   Report Status 03/10/2012 FINAL   Final   SURGICAL PCR SCREEN     Status: Abnormal   Collection Time   03/13/12  5:56 AM      Component Value Range Status Comment   MRSA, PCR POSITIVE (*) NEGATIVE Final    Staphylococcus aureus POSITIVE (*) NEGATIVE Final      Labs: Basic Metabolic Panel:  Lab 03/14/12 4540 03/13/12 0510 03/12/12 0530 03/11/12 0550 03/10/12 0510 03/09/12 1344  NA 141 138 138 142 139 --  K 5.4* 5.0 4.6 4.7 4.4 --  CL 109 107 105 111 105 --  CO2 20 22 22 21 24  --  GLUCOSE 103* 68* 118* 73 77 --  BUN 51* 49* 48* 50* 57* --  CREATININE 3.60* 3.44* 3.57* 3.70* 4.42* --  CALCIUM 8.9 8.8 8.9 8.6 8.2* --  MG -- -- -- -- -- 2.9*  PHOS 5.1* 4.3 4.2 3.7 5.2* --   Liver Function Tests:  Lab 03/14/12 0500 03/13/12 0510 03/12/12 0530 03/11/12 0550 03/10/12 0510 03/09/12 1344  AST -- -- -- -- -- 14  ALT -- -- -- -- -- 9  ALKPHOS -- -- -- -- -- 66  BILITOT -- -- -- -- -- 0.1*  PROT -- -- -- -- -- 6.8  ALBUMIN 2.9* 2.8* 3.1* 2.8* 3.0* --   No results found for this basename: LIPASE:5,AMYLASE:5 in the last 168 hours No results found for this basename: AMMONIA:5 in the last 168 hours CBC:  Lab 03/14/12 0500 03/13/12 0510 03/12/12 0530 03/11/12 0550 03/10/12 0510  WBC 7.1 6.2 6.4 5.9 6.0  NEUTROABS -- -- -- -- --  HGB 9.7* 10.0* 10.9* 9.8* 9.9*  HCT 29.0* 29.3* 32.9* 29.8* 29.5*  MCV 92.4 90.7 92.7 91.4 93.7  PLT 235 234 227 209 207   Cardiac Enzymes:  Lab 03/08/12 2028  CKTOTAL 84  CKMB --  CKMBINDEX --  TROPONINI --   BNP: BNP (last 3 results) No results found for this basename: PROBNP:3 in the last 8760 hours CBG:  Lab 03/14/12 1136 03/14/12 0745 03/13/12 2134 03/13/12 1805 03/13/12 1708  GLUCAP 134* 83 121* 96 77     SIGNED: Time coordinating discharge: Over 30 minutes  Carianne Taira, MD  [facility] 03/14/2012, 2:19 PM Pager [phone]  If 7PM-7AM, please contact night-coverage [email or web address] Password [email or web address]

## 2012-03-14 NOTE — Progress Notes (Addendum)
  VASCULAR AND VEIN SURGERY PROGRESS NOTE  POST-OP HEMODIALYSIS ACCESS  Date of Surgery: 03/08/2012 - 03/13/2012 Surgeon: Moishe Spice): Fransisco Hertz, MD 1 Day Post-Op Right Procedure(s): ARTERIOVENOUS (AV) FISTULA CREATION   HPI: [name] [name] is a 71 y.o. female who is 1 Day Post-Op creation/revision of right upper extremity Hemodialysis access. The patient denies symptoms of numbness; denies pain in the operative limb.   Significant Diagnostic Studies: CBC Lab Results  Component Value Date   WBC 7.1 03/14/2012   HGB 9.7* 03/14/2012   HCT 29.0* 03/14/2012   MCV 92.4 03/14/2012   PLT 235 03/14/2012    BMET    Component Value Date/Time   NA 138 03/13/2012 0510   K 5.0 03/13/2012 0510   CL 107 03/13/2012 0510   CO2 22 03/13/2012 0510   GLUCOSE 68* 03/13/2012 0510   BUN 49* 03/13/2012 0510   CREATININE 3.44* 03/13/2012 0510   CALCIUM 8.8 03/13/2012 0510   GFRNONAA 12* 03/13/2012 0510   GFRAA 14* 03/13/2012 0510    COAG Lab Results  Component Value Date   INR 1.09 02/23/2011   No results found for this basename: PTT    Vital Signs  BP Readings from Last 3 Encounters:  03/14/12 138/56  03/14/12 138/56  01/24/12 120/70   Temp Readings from Last 3 Encounters:  03/14/12 98.5 F (36.9 C) Oral  03/14/12 98.5 F (36.9 C) Oral  01/24/12 97.7 F (36.5 C) Oral   SpO2 Readings from Last 3 Encounters:  03/14/12 93%  03/14/12 93%  01/24/12 98%   Pulse Readings from Last 3 Encounters:  03/14/12 65  03/14/12 65  01/24/12 91     Physical Examination  right upper Incision is clean, dry, intact or healing well, skin color is normal , hand grip is 5/5, sensation in digits is intact;  There is a good thrill and good bruit in the right upper arm.  Assessment/Plan [name] [name] is a 71 y.o. year old female who presents s/p creation/revision of right upper extremity Hemodialysis access. Follow-up in 6 weeks  The patient's access will be ready for use in 12 weeks.  Clinton Gallant  [facility] 03/14/2012 7:35 AM  Addendum  I have independently interviewed and examined the patient, and I agree with the physician assistant's findings.  No sign of steal.  Good bruit in upper arm.  Follow in office for re-evaluation prior to proceeding with 2nd stage of BVT.  Leonides Sake, MD [facility] [place] Office: [phone] Pager: [phone]  03/14/2012, 8:17 AM

## 2012-03-28 ENCOUNTER — Encounter: Payer: Self-pay | Admitting: Pulmonary Disease

## 2012-03-28 ENCOUNTER — Ambulatory Visit (INDEPENDENT_AMBULATORY_CARE_PROVIDER_SITE_OTHER): Payer: Medicare Other | Admitting: Pulmonary Disease

## 2012-03-28 VITALS — BP 120/72 | HR 79 | Temp 98.2°F | Ht 67.5 in | Wt 192.6 lb

## 2012-03-28 DIAGNOSIS — J449 Chronic obstructive pulmonary disease, unspecified: Secondary | ICD-10-CM

## 2012-03-28 MED ORDER — ALBUTEROL SULFATE HFA 108 (90 BASE) MCG/ACT IN AERS: 2.0000 | INHALATION_SPRAY | Freq: Four times a day (QID) | RESPIRATORY_TRACT | Status: DC | PRN

## 2012-03-28 NOTE — Patient Instructions (Addendum)
Continue on dulera twice a day, and can use albuterol for rescue if needed. You must stop smoking 100% if you wish to stay well.   followup with me in 6mos.

## 2012-03-28 NOTE — Progress Notes (Signed)
  Subjective:    Patient ID: [name] [name], female    DOB: 1941/01/19, 71 y.o.   MRN: [id number]  HPI The patient comes in today for followup of her known asthmatic bronchitis related to ongoing smoking.  She really has minimal chronic airflow obstruction on her PFTs.  We have elected to keep her on dulera to prevent recurrent episodes of acute asthmatic bronchitis, however she continues to smoke.  She continues to have dyspnea on exertion as well as a cough with white mucus production.   Review of Systems  Constitutional: Negative for fever and unexpected weight change.  HENT: Negative for ear pain, nosebleeds, congestion, sore throat, rhinorrhea, sneezing, trouble swallowing, dental problem, postnasal drip and sinus pressure.   Eyes: Negative for redness and itching.  Respiratory: Positive for shortness of breath and wheezing. Negative for cough and chest tightness.   Cardiovascular: Positive for leg swelling. Negative for palpitations.  Gastrointestinal: Negative for nausea and vomiting.  Genitourinary: Negative for dysuria.  Musculoskeletal: Negative for joint swelling.  Skin: Negative for rash.  Neurological: Negative for headaches.  Hematological: Bruises/bleeds easily.  Psychiatric/Behavioral: Negative for dysphoric mood. The patient is nervous/anxious.   All other systems reviewed and are negative.       Objective:   Physical Exam Overweight female in no acute distress Nose without purulence or discharge noted Oropharynx clear Chest clear to auscultation, with no wheezes or rhonchi Cardiac exam is regular rate and rhythm Lower extremities with 1+ edema, no cyanosis Alert and oriented, moves all 4 extremities.       Assessment & Plan:

## 2012-03-28 NOTE — Assessment & Plan Note (Signed)
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  I believe that she would need no maintenance bronchodilators if she were to totally stop smoking.  At this point, I have asked her to continue on dulera twice a day, and to try and stop smoking 100%.

## 2012-04-10 ENCOUNTER — Encounter: Payer: Self-pay | Admitting: Vascular Surgery

## 2012-04-11 ENCOUNTER — Encounter: Payer: Self-pay | Admitting: Vascular Surgery

## 2012-04-11 ENCOUNTER — Ambulatory Visit (INDEPENDENT_AMBULATORY_CARE_PROVIDER_SITE_OTHER): Payer: Medicare Other | Admitting: Vascular Surgery

## 2012-04-11 VITALS — BP 135/86 | HR 83 | Resp 18 | Ht 67.5 in | Wt 190.0 lb

## 2012-04-11 DIAGNOSIS — N186 End stage renal disease: Secondary | ICD-10-CM

## 2012-04-11 NOTE — Progress Notes (Signed)
[facility] [place]  Postoperative Access Visit  History of Present Illness  [name] [name] is a 71 y.o. year old female who presents for postoperative follow-up for: R 1st stage BVT (Date: 03/13/12).  The patient's wounds are healed.  The patient notes no steal symptoms.  The patient is able to complete their activities of daily living.  The patient's current symptoms are: none.  Physical Examination  Filed Vitals:   04/11/12 1509  BP: 135/86  Pulse: 83  Resp: 18   RUE: Incision is healed, skin feels warm, hand grip is 5/5, sensation in digits is intact, palpable thrill, bruit can be auscultated, vein is >6 mm throughout  Medical Decision Making  [name] [name] is a 71 y.o. year old female who presents s/p successful R 1st BVT.  I will be scheduling the patient for R 2nd stage BVT at her convenience.  She has multiple conflicting appt so she is going to call us with the day she is available.  Thank you for allowing [place] to participate in this patient's care.  Leonides Sake, MD [facility] [place] Office: [phone] Pager: [phone]

## 2012-04-15 ENCOUNTER — Other Ambulatory Visit: Payer: Self-pay | Admitting: Internal Medicine

## 2012-04-15 ENCOUNTER — Other Ambulatory Visit: Payer: Self-pay | Admitting: *Deleted

## 2012-04-15 NOTE — Telephone Encounter (Signed)
Faxed hardcopy to pharmacy. 

## 2012-04-15 NOTE — Telephone Encounter (Signed)
Ok for pain med - Done hardcopy to robin   Not sure why she would need the antibiotic

## 2012-04-16 ENCOUNTER — Encounter (HOSPITAL_COMMUNITY): Payer: Self-pay | Admitting: Pharmacy Technician

## 2012-04-17 ENCOUNTER — Encounter (HOSPITAL_COMMUNITY): Payer: Self-pay

## 2012-04-17 ENCOUNTER — Encounter (HOSPITAL_COMMUNITY)
Admission: RE | Admit: 2012-04-17 | Discharge: 2012-04-17 | Disposition: A | Discharge status: Home / Self Care | Payer: Medicare Other | Source: Ambulatory Visit | Attending: Vascular Surgery | Admitting: Vascular Surgery

## 2012-04-17 ENCOUNTER — Ambulatory Visit: Payer: Medicare Other | Admitting: Internal Medicine

## 2012-04-17 HISTORY — DX: Gastro-esophageal reflux disease without esophagitis: K21.9

## 2012-04-17 LAB — SURGICAL PCR SCREEN: Staphylococcus aureus: NEGATIVE

## 2012-04-17 NOTE — Pre-Procedure Instructions (Signed)
20 [name] [name]  04/17/2012   Your procedure is scheduled on:  Sept 18, 2013  Report to [facility] at 7:30 AM.  Call this number if you have problems the morning of surgery: [phone]   Remember:   Do not eat food:After Midnight.    Take these medicines the morning of surgery with A SIP OF WATER: phenergan as needed, prilosec, inhalers, synthroid, pain pill, cymbalta, norvasc,    Do not wear jewelry, make-up or nail polish.  Do not wear lotions, powders, or perfumes. You may wear deodorant.  Do not shave 48 hours prior to surgery. Men may shave face and neck.  Do not bring valuables to the hospital.  Contacts, dentures or bridgework may not be worn into surgery.  Leave suitcase in the car. After surgery it may be brought to your room.  For patients admitted to the hospital, checkout time is 11:00 AM the day of discharge.   Patients discharged the day of surgery will not be allowed to drive home.  Name and phone number of your driver: none staying overnight  Special Instructions: CHG Shower Use Special Wash: 1/2 bottle night before surgery and 1/2 bottle morning of surgery.   Please read over the following fact sheets that you were given: Pain Booklet, Coughing and Deep Breathing and Surgical Site Infection Prevention

## 2012-04-17 NOTE — Progress Notes (Signed)
Per Darel Hong at [facility] 8:30  Arrival ok

## 2012-04-21 ENCOUNTER — Other Ambulatory Visit: Payer: Self-pay | Admitting: Internal Medicine

## 2012-04-21 NOTE — Progress Notes (Signed)
Left message at [facility] requested that labs be faxed over.

## 2012-04-22 MED ORDER — DEXTROSE 5 % IV SOLN
1.5000 g | INTRAVENOUS | Status: AC
  Administered 2012-04-23: 1.5 g via INTRAVENOUS
  Filled 2012-04-22: qty 1.5

## 2012-04-23 ENCOUNTER — Ambulatory Visit (HOSPITAL_COMMUNITY)
Admission: RE | Admit: 2012-04-23 | Discharge: 2012-04-24 | Disposition: A | Discharge status: Home / Self Care | Payer: Medicare Other | Source: Ambulatory Visit | Attending: Vascular Surgery | Admitting: Vascular Surgery

## 2012-04-23 ENCOUNTER — Encounter (HOSPITAL_COMMUNITY)
Admission: RE | Disposition: A | Discharge status: Home / Self Care | Payer: Self-pay | Source: Ambulatory Visit | Attending: Vascular Surgery

## 2012-04-23 ENCOUNTER — Encounter (HOSPITAL_COMMUNITY): Payer: Self-pay | Admitting: Surgery

## 2012-04-23 ENCOUNTER — Encounter (HOSPITAL_COMMUNITY): Payer: Self-pay | Admitting: Anesthesiology

## 2012-04-23 ENCOUNTER — Ambulatory Visit (HOSPITAL_COMMUNITY): Payer: Medicare Other | Admitting: Anesthesiology

## 2012-04-23 DIAGNOSIS — J449 Chronic obstructive pulmonary disease, unspecified: Secondary | ICD-10-CM | POA: Insufficient documentation

## 2012-04-23 DIAGNOSIS — E119 Type 2 diabetes mellitus without complications: Secondary | ICD-10-CM | POA: Insufficient documentation

## 2012-04-23 DIAGNOSIS — K219 Gastro-esophageal reflux disease without esophagitis: Secondary | ICD-10-CM | POA: Insufficient documentation

## 2012-04-23 DIAGNOSIS — J4489 Other specified chronic obstructive pulmonary disease: Secondary | ICD-10-CM | POA: Insufficient documentation

## 2012-04-23 DIAGNOSIS — F17200 Nicotine dependence, unspecified, uncomplicated: Secondary | ICD-10-CM | POA: Insufficient documentation

## 2012-04-23 DIAGNOSIS — N185 Chronic kidney disease, stage 5: Secondary | ICD-10-CM | POA: Insufficient documentation

## 2012-04-23 DIAGNOSIS — E039 Hypothyroidism, unspecified: Secondary | ICD-10-CM | POA: Insufficient documentation

## 2012-04-23 DIAGNOSIS — N186 End stage renal disease: Secondary | ICD-10-CM

## 2012-04-23 DIAGNOSIS — Z01812 Encounter for preprocedural laboratory examination: Secondary | ICD-10-CM | POA: Insufficient documentation

## 2012-04-23 DIAGNOSIS — I120 Hypertensive chronic kidney disease with stage 5 chronic kidney disease or end stage renal disease: Secondary | ICD-10-CM | POA: Insufficient documentation

## 2012-04-23 LAB — CBC
HCT: 30.1 % — ABNORMAL LOW (ref 36.0–46.0)
Hemoglobin: 10.1 g/dL — ABNORMAL LOW (ref 12.0–15.0)
MCH: 30.5 pg (ref 26.0–34.0)
MCHC: 33.6 g/dL (ref 30.0–36.0)
RDW: 14.5 % (ref 11.5–15.5)

## 2012-04-23 LAB — POCT I-STAT 4, (NA,K, GLUC, HGB,HCT)
Glucose, Bld: 101 mg/dL — ABNORMAL HIGH (ref 70–99)
HCT: 34 % — ABNORMAL LOW (ref 36.0–46.0)
Potassium: 4.6 mEq/L (ref 3.5–5.1)

## 2012-04-23 SURGERY — TRANSPOSITION, VEIN, BASILIC
Anesthesia: General | Site: Arm Upper | Laterality: Right | Wound class: Clean

## 2012-04-23 MED ORDER — FENTANYL CITRATE 0.05 MG/ML IJ SOLN
INTRAMUSCULAR | Status: DC | PRN
  Administered 2012-04-23 (×2): 25 ug via INTRAVENOUS
  Administered 2012-04-23: 100 ug via INTRAVENOUS

## 2012-04-23 MED ORDER — FLUTICASONE PROPIONATE 50 MCG/ACT NA SUSP
2.0000 | Freq: Every day | NASAL | Status: DC
  Administered 2012-04-24: 2 via NASAL
  Filled 2012-04-23: qty 16

## 2012-04-23 MED ORDER — ONDANSETRON HCL 4 MG/2ML IJ SOLN: 4.0000 mg | Freq: Once | INTRAMUSCULAR | Status: DC | PRN

## 2012-04-23 MED ORDER — SODIUM CHLORIDE 0.9 % IV SOLN: 250.0000 mL | INTRAVENOUS | Status: DC | PRN

## 2012-04-23 MED ORDER — ACETAMINOPHEN 650 MG RE SUPP: 325.0000 mg | RECTAL | Status: DC | PRN

## 2012-04-23 MED ORDER — DIPHENHYDRAMINE HCL 25 MG PO CAPS: 25.0000 mg | ORAL_CAPSULE | Freq: Four times a day (QID) | ORAL | Status: DC | PRN

## 2012-04-23 MED ORDER — ONDANSETRON HCL 4 MG/2ML IJ SOLN
INTRAMUSCULAR | Status: DC | PRN
  Administered 2012-04-23: 4 mg via INTRAVENOUS

## 2012-04-23 MED ORDER — HEPARIN SODIUM (PORCINE) 5000 UNIT/ML IJ SOLN
5000.0000 [IU] | Freq: Three times a day (TID) | INTRAMUSCULAR | Status: DC
  Administered 2012-04-23 – 2012-04-24 (×2): 5000 [IU] via SUBCUTANEOUS
  Filled 2012-04-23 (×6): qty 1

## 2012-04-23 MED ORDER — CEPHALEXIN 250 MG PO CAPS
250.0000 mg | ORAL_CAPSULE | Freq: Four times a day (QID) | ORAL | Status: DC
  Administered 2012-04-23 – 2012-04-24 (×2): 250 mg via ORAL
  Filled 2012-04-23 (×5): qty 1

## 2012-04-23 MED ORDER — 0.9 % SODIUM CHLORIDE (POUR BTL) OPTIME
TOPICAL | Status: DC | PRN
  Administered 2012-04-23: 1000 mL

## 2012-04-23 MED ORDER — INFLUENZA VIRUS VACC SPLIT PF IM SUSP
0.5000 mL | INTRAMUSCULAR | Status: AC
  Administered 2012-04-24: 0.5 mL via INTRAMUSCULAR
  Filled 2012-04-23: qty 0.5

## 2012-04-23 MED ORDER — ACETAMINOPHEN 10 MG/ML IV SOLN: 1000.0000 mg | Freq: Once | INTRAVENOUS | Status: DC | PRN

## 2012-04-23 MED ORDER — CEPHALEXIN 500 MG PO CAPS: 500.0000 mg | ORAL_CAPSULE | Freq: Every day | ORAL | Status: DC

## 2012-04-23 MED ORDER — HYDRALAZINE HCL 20 MG/ML IJ SOLN
10.0000 mg | INTRAMUSCULAR | Status: DC | PRN
  Filled 2012-04-23: qty 0.5

## 2012-04-23 MED ORDER — LIDOCAINE HCL (CARDIAC) 20 MG/ML IV SOLN
INTRAVENOUS | Status: DC | PRN
  Administered 2012-04-23: 30 mg via INTRAVENOUS

## 2012-04-23 MED ORDER — SODIUM CHLORIDE 0.9 % IJ SOLN: 3.0000 mL | INTRAMUSCULAR | Status: DC | PRN

## 2012-04-23 MED ORDER — ALBUTEROL SULFATE HFA 108 (90 BASE) MCG/ACT IN AERS
1.0000 | INHALATION_SPRAY | RESPIRATORY_TRACT | Status: DC | PRN
  Filled 2012-04-23: qty 6.7

## 2012-04-23 MED ORDER — AMLODIPINE BESYLATE 5 MG PO TABS
5.0000 mg | ORAL_TABLET | Freq: Every day | ORAL | Status: DC
  Administered 2012-04-24: 5 mg via ORAL
  Filled 2012-04-23 (×2): qty 1

## 2012-04-23 MED ORDER — GUAIFENESIN-DM 100-10 MG/5ML PO SYRP: 15.0000 mL | ORAL_SOLUTION | ORAL | Status: DC | PRN

## 2012-04-23 MED ORDER — SODIUM CHLORIDE 0.9 % IV SOLN
INTRAVENOUS | Status: DC
  Administered 2012-04-23: 11:00:00 via INTRAVENOUS

## 2012-04-23 MED ORDER — THROMBIN 20000 UNITS EX SOLR
CUTANEOUS | Status: DC | PRN
  Administered 2012-04-23: 13:00:00 via TOPICAL

## 2012-04-23 MED ORDER — CLONAZEPAM 1 MG PO TABS: 2.0000 mg | ORAL_TABLET | Freq: Two times a day (BID) | ORAL | Status: DC | PRN

## 2012-04-23 MED ORDER — PHENOL 1.4 % MT LIQD
1.0000 | OROMUCOSAL | Status: DC | PRN
  Administered 2012-04-23: 1 via OROMUCOSAL
  Filled 2012-04-23: qty 177

## 2012-04-23 MED ORDER — THROMBIN 20000 UNITS EX SOLR
CUTANEOUS | Status: AC
  Filled 2012-04-23: qty 40000

## 2012-04-23 MED ORDER — MONTELUKAST SODIUM 10 MG PO TABS
10.0000 mg | ORAL_TABLET | Freq: Every day | ORAL | Status: DC
  Administered 2012-04-23 – 2012-04-24 (×2): 10 mg via ORAL
  Filled 2012-04-23 (×2): qty 1

## 2012-04-23 MED ORDER — PROPOFOL 10 MG/ML IV BOLUS
INTRAVENOUS | Status: DC | PRN
  Administered 2012-04-23: 130 mg via INTRAVENOUS

## 2012-04-23 MED ORDER — ASPIRIN EC 81 MG PO TBEC
81.0000 mg | DELAYED_RELEASE_TABLET | Freq: Every day | ORAL | Status: DC
  Administered 2012-04-24: 81 mg via ORAL
  Filled 2012-04-23 (×2): qty 1

## 2012-04-23 MED ORDER — LEVOTHYROXINE SODIUM 125 MCG PO TABS
125.0000 ug | ORAL_TABLET | Freq: Every day | ORAL | Status: DC
  Administered 2012-04-24: 125 ug via ORAL
  Filled 2012-04-23 (×2): qty 1

## 2012-04-23 MED ORDER — CYCLOBENZAPRINE HCL 10 MG PO TABS: 10.0000 mg | ORAL_TABLET | Freq: Three times a day (TID) | ORAL | Status: DC | PRN

## 2012-04-23 MED ORDER — ONDANSETRON HCL 4 MG/2ML IJ SOLN: 4.0000 mg | Freq: Four times a day (QID) | INTRAMUSCULAR | Status: DC | PRN

## 2012-04-23 MED ORDER — HYDROMORPHONE HCL PF 1 MG/ML IJ SOLN: 0.2500 mg | INTRAMUSCULAR | Status: DC | PRN

## 2012-04-23 MED ORDER — ALLOPURINOL 100 MG PO TABS
100.0000 mg | ORAL_TABLET | Freq: Every day | ORAL | Status: DC
  Administered 2012-04-24: 100 mg via ORAL
  Filled 2012-04-23 (×2): qty 1

## 2012-04-23 MED ORDER — HYDROMORPHONE HCL PF 1 MG/ML IJ SOLN
INTRAMUSCULAR | Status: AC
  Filled 2012-04-23: qty 1

## 2012-04-23 MED ORDER — DULOXETINE HCL 60 MG PO CPEP
60.0000 mg | ORAL_CAPSULE | Freq: Every day | ORAL | Status: DC
  Administered 2012-04-23 – 2012-04-24 (×2): 60 mg via ORAL
  Filled 2012-04-23 (×2): qty 1

## 2012-04-23 MED ORDER — ATORVASTATIN CALCIUM 40 MG PO TABS
40.0000 mg | ORAL_TABLET | Freq: Every day | ORAL | Status: DC
  Administered 2012-04-23 – 2012-04-24 (×2): 40 mg via ORAL
  Filled 2012-04-23 (×2): qty 1

## 2012-04-23 MED ORDER — HYDROMORPHONE HCL PF 1 MG/ML IJ SOLN
0.2500 mg | INTRAMUSCULAR | Status: DC | PRN
  Administered 2012-04-23 (×3): 0.5 mg via INTRAVENOUS

## 2012-04-23 MED ORDER — SODIUM CHLORIDE 0.9 % IJ SOLN
3.0000 mL | Freq: Two times a day (BID) | INTRAMUSCULAR | Status: DC
  Administered 2012-04-23 – 2012-04-24 (×3): 3 mL via INTRAVENOUS

## 2012-04-23 MED ORDER — MOMETASONE FURO-FORMOTEROL FUM 100-5 MCG/ACT IN AERO
2.0000 | INHALATION_SPRAY | Freq: Two times a day (BID) | RESPIRATORY_TRACT | Status: DC
  Filled 2012-04-23: qty 8.8

## 2012-04-23 MED ORDER — HYDROCODONE-ACETAMINOPHEN 5-325 MG PO TABS
1.0000 | ORAL_TABLET | ORAL | Status: DC | PRN
  Administered 2012-04-23 – 2012-04-24 (×4): 2 via ORAL
  Filled 2012-04-23 (×4): qty 2

## 2012-04-23 MED ORDER — ACETAMINOPHEN 325 MG PO TABS: 325.0000 mg | ORAL_TABLET | ORAL | Status: DC | PRN

## 2012-04-23 MED ORDER — SODIUM CHLORIDE 0.9 % IR SOLN
Status: DC | PRN
  Administered 2012-04-23: 12:00:00

## 2012-04-23 MED ORDER — FERROUS SULFATE 325 (65 FE) MG PO TABS
325.0000 mg | ORAL_TABLET | Freq: Two times a day (BID) | ORAL | Status: DC
  Administered 2012-04-23 – 2012-04-24 (×2): 325 mg via ORAL
  Filled 2012-04-23 (×3): qty 1

## 2012-04-23 MED ORDER — DOCUSATE SODIUM 100 MG PO CAPS: 100.0000 mg | ORAL_CAPSULE | Freq: Two times a day (BID) | ORAL | Status: DC | PRN

## 2012-04-23 MED ORDER — FUROSEMIDE 80 MG PO TABS
160.0000 mg | ORAL_TABLET | Freq: Every day | ORAL | Status: DC
  Administered 2012-04-23 – 2012-04-24 (×2): 160 mg via ORAL
  Filled 2012-04-23 (×2): qty 2

## 2012-04-23 MED ORDER — METOPROLOL TARTRATE 1 MG/ML IV SOLN: 2.0000 mg | INTRAVENOUS | Status: DC | PRN

## 2012-04-23 MED ORDER — SODIUM BICARBONATE 650 MG PO TABS
650.0000 mg | ORAL_TABLET | Freq: Every day | ORAL | Status: DC
  Administered 2012-04-24: 650 mg via ORAL
  Filled 2012-04-23 (×2): qty 1

## 2012-04-23 MED ORDER — PROMETHAZINE HCL 25 MG PO TABS: 25.0000 mg | ORAL_TABLET | Freq: Three times a day (TID) | ORAL | Status: DC | PRN

## 2012-04-23 MED ORDER — SODIUM CHLORIDE 0.9 % IV SOLN
INTRAVENOUS | Status: DC | PRN
  Administered 2012-04-23 (×2): via INTRAVENOUS

## 2012-04-23 MED ORDER — PREGABALIN 100 MG PO CAPS
200.0000 mg | ORAL_CAPSULE | Freq: Every day | ORAL | Status: DC
  Administered 2012-04-23: 100 mg via ORAL
  Administered 2012-04-24: 200 mg via ORAL
  Filled 2012-04-23 (×2): qty 2

## 2012-04-23 MED ORDER — LABETALOL HCL 5 MG/ML IV SOLN
10.0000 mg | INTRAVENOUS | Status: DC | PRN
  Filled 2012-04-23: qty 4

## 2012-04-23 MED ORDER — PANTOPRAZOLE SODIUM 40 MG PO TBEC
40.0000 mg | DELAYED_RELEASE_TABLET | Freq: Every day | ORAL | Status: DC
  Administered 2012-04-24: 40 mg via ORAL
  Filled 2012-04-23: qty 1

## 2012-04-23 MED ORDER — TRAZODONE HCL 100 MG PO TABS
100.0000 mg | ORAL_TABLET | Freq: Every evening | ORAL | Status: DC | PRN
  Administered 2012-04-23: 100 mg via ORAL
  Filled 2012-04-23: qty 1

## 2012-04-23 SURGICAL SUPPLY — 45 items
ADH SKN CLS APL DERMABOND .7 (GAUZE/BANDAGES/DRESSINGS) ×2
CANISTER SUCTION 2500CC (MISCELLANEOUS) ×2 IMPLANT
CLIP TI MEDIUM 24 (CLIP) ×2 IMPLANT
CLIP TI WIDE RED SMALL 24 (CLIP) ×2 IMPLANT
CLOTH BEACON ORANGE TIMEOUT ST (SAFETY) ×2 IMPLANT
CONT SPECI 4OZ STER CLIK (MISCELLANEOUS) ×2 IMPLANT
COVER PROBE W GEL 5X96 (DRAPES) ×1 IMPLANT
COVER SURGICAL LIGHT HANDLE (MISCELLANEOUS) ×2 IMPLANT
DECANTER SPIKE VIAL GLASS SM (MISCELLANEOUS) ×2 IMPLANT
DERMABOND ADVANCED (GAUZE/BANDAGES/DRESSINGS) ×2
DERMABOND ADVANCED .7 DNX12 (GAUZE/BANDAGES/DRESSINGS) ×1 IMPLANT
DRAIN PENROSE 1/2X12 LTX STRL (WOUND CARE) IMPLANT
ELECT REM PT RETURN 9FT ADLT (ELECTROSURGICAL) ×2
ELECTRODE REM PT RTRN 9FT ADLT (ELECTROSURGICAL) ×1 IMPLANT
GLOVE BIO SURGEON STRL SZ 6.5 (GLOVE) ×1 IMPLANT
GLOVE BIO SURGEON STRL SZ7 (GLOVE) ×2 IMPLANT
GLOVE BIOGEL PI IND STRL 6.5 (GLOVE) IMPLANT
GLOVE BIOGEL PI IND STRL 7.0 (GLOVE) IMPLANT
GLOVE BIOGEL PI IND STRL 7.5 (GLOVE) ×1 IMPLANT
GLOVE BIOGEL PI INDICATOR 6.5 (GLOVE) ×2
GLOVE BIOGEL PI INDICATOR 7.0 (GLOVE) ×1
GLOVE BIOGEL PI INDICATOR 7.5 (GLOVE) ×2
GLOVE SS BIOGEL STRL SZ 7 (GLOVE) IMPLANT
GLOVE SUPERSENSE BIOGEL SZ 7 (GLOVE) ×1
GLOVE SURG SS PI 7.5 STRL IVOR (GLOVE) ×2 IMPLANT
GOWN STRL NON-REIN LRG LVL3 (GOWN DISPOSABLE) ×6 IMPLANT
KIT BASIN OR (CUSTOM PROCEDURE TRAY) ×2 IMPLANT
KIT ROOM TURNOVER OR (KITS) ×2 IMPLANT
MARKER SKIN DUAL TIP RULER LAB (MISCELLANEOUS) ×1 IMPLANT
NS IRRIG 1000ML POUR BTL (IV SOLUTION) ×2 IMPLANT
PACK CV ACCESS (CUSTOM PROCEDURE TRAY) ×2 IMPLANT
PAD ARMBOARD 7.5X6 YLW CONV (MISCELLANEOUS) ×4 IMPLANT
SPONGE SURGIFOAM ABS GEL 100 (HEMOSTASIS) IMPLANT
SUT MNCRL AB 4-0 PS2 18 (SUTURE) ×4 IMPLANT
SUT PROLENE 6 0 BV (SUTURE) IMPLANT
SUT PROLENE 7 0 BV 1 (SUTURE) ×2 IMPLANT
SUT SILK 2 0 SH (SUTURE) ×2 IMPLANT
SUT VIC AB 2-0 CT1 27 (SUTURE) ×4
SUT VIC AB 2-0 CT1 TAPERPNT 27 (SUTURE) IMPLANT
SUT VIC AB 3-0 SH 27 (SUTURE) ×8
SUT VIC AB 3-0 SH 27X BRD (SUTURE) ×1 IMPLANT
TOWEL OR 17X24 6PK STRL BLUE (TOWEL DISPOSABLE) ×2 IMPLANT
TOWEL OR 17X26 10 PK STRL BLUE (TOWEL DISPOSABLE) ×2 IMPLANT
UNDERPAD 30X30 INCONTINENT (UNDERPADS AND DIAPERS) ×2 IMPLANT
WATER STERILE IRR 1000ML POUR (IV SOLUTION) ×2 IMPLANT

## 2012-04-23 NOTE — Anesthesia Preprocedure Evaluation (Addendum)
Anesthesia Evaluation  Patient identified by MRN, date of birth, ID band Patient awake    Reviewed: Allergy & Precautions, H&P , NPO status   Airway Mallampati: II      Dental  (+) Edentulous Upper and Edentulous Lower   Pulmonary asthma , COPDCurrent Smoker,  breath sounds clear to auscultation        Cardiovascular hypertension, Rhythm:Regular Rate:Normal     Neuro/Psych  Headaches, Seizures -, Well Controlled,  Anxiety Depression  Neuromuscular disease    GI/Hepatic PUD, GERD-  Medicated,  Endo/Other  diabetes, Type 2, Oral Hypoglycemic AgentsHypothyroidism   Renal/GU ESRFRenal disease     Musculoskeletal  (+) Arthritis -, Osteoarthritis,    Abdominal   Peds  Hematology   Anesthesia Other Findings   Reproductive/Obstetrics                          Anesthesia Physical Anesthesia Plan  ASA: III  Anesthesia Plan: General   Post-op Pain Management:    Induction: Intravenous  Airway Management Planned: LMA  Additional Equipment:   Intra-op Plan:   Post-operative Plan:   Informed Consent: I have reviewed the patients History and Physical, chart, labs and discussed the procedure including the risks, benefits and alternatives for the proposed anesthesia with the patient or authorized representative who has indicated his/her understanding and acceptance.     Plan Discussed with: Surgeon and CRNA  Anesthesia Plan Comments: (ESRD has not started HD  K-4.6 htn Type 2 DM glucose 101 Smoker/COPD  Plan GA with LMA  Kipp Brood, MD)       Anesthesia Quick Evaluation

## 2012-04-23 NOTE — Progress Notes (Addendum)
Pt here for admission.  Lab able to obtain ISTAT but not enough blood for INR.  Pt not on coumadin.  Dr. Imogene Burn notified and stated that INR could be discontinued if pt is not on Coumadin.  INR cancelled. Pt states that friend, Ladean Raya [phone]) wishes to be contacted re: pt's status after surgery.  Note applied to outside of chart for OR staff.//L. Klaus Casteneda,RN

## 2012-04-23 NOTE — Anesthesia Postprocedure Evaluation (Signed)
  Anesthesia Post-op Note  Patient: [name] [name]  Procedure(s) Performed: Procedure(s) (LRB) with comments: BASCILIC VEIN TRANSPOSITION (Right) - 2ND STAGE  Patient Location: PACU  Anesthesia Type: General  Level of Consciousness: awake, alert  and oriented  Airway and Oxygen Therapy: Patient Spontanous Breathing and Patient connected to nasal cannula oxygen  Post-op Pain: mild  Post-op Assessment: Post-op Vital signs reviewed and Patient's Cardiovascular Status Stable  Post-op Vital Signs: stable  Complications: No apparent anesthesia complications

## 2012-04-23 NOTE — Preoperative (Addendum)
Beta Blockers   Reason not to administer Beta Blockers:Not Applicable 

## 2012-04-23 NOTE — Op Note (Signed)
OPERATIVE NOTE   PROCEDURE: right second stage basilic vein transposition (brachiobasilic arteriovenous fistula) placement  PRE-OPERATIVE DIAGNOSIS: chronic kidney disease stage V   POST-OPERATIVE DIAGNOSIS: same as above   SURGEON: Leonides Sake, MD  ASSISTANT(S): Lianne Cure, PAC   ANESTHESIA: general  ESTIMATED BLOOD LOSS: 50 cc  FINDING(S): 1.  Widely patent basilic vein transposition (7-8 mm in diameter throughout)  SPECIMEN(S):  none  INDICATIONS:   [name] [name] is a 71 y.o. female who presents with chronic kidney disease stage V.  She previously underwent a successful first stage basilic vein transposition on the right arm.   The patient is scheduled for right second stage basilic vein transposition.  The patient is aware the risks include but are not limited to: bleeding, infection, steal syndrome, nerve damage, ischemic monomelic neuropathy, failure to mature, and need for additional procedures.  The patient is aware of the risks of the procedure and elects to proceed forward.  DESCRIPTION: After full informed written consent was obtained from the patient, the patient was brought back to the operating room and placed supine upon the operating table.  Prior to induction, the patient received IV antibiotics.   After obtaining adequate anesthesia, the patient was then prepped and draped in the standard fashion for a right arm access procedure.  I turned my attention first to identifying the patient's brachiobasilic arteriovenous fistula.  Using SonoSite guidance, the location of this fistula was marked out on the skin.   I made an longitudinal incision over the fistula from its arterial anastomosis up to its axillary extent.  I carefully dissected the fistula away from its adjacent nerves except for a small side branch which had to be transected to transpose the fistula.  Eventually the entirety of this fistula was mobilized and I dissected a plane on top of the bicipital fascia  with electrocautery.  Also with electrocautery, I developed a curvilinear subcutaneous trough in which to transpose the fistula into.  The fistula was secured in its new location with loosely placed interrupt 3-0 Vicryl stitches, taking care to avoid compressing the fistula.  The deep subcutaneous tissue was inspected for bleeding.  Bleeding was controlled with electrocautery and placement of large pieces of thrombin and gelfoam.  I washed out the surgical site after waiting a few minutes, and there was no further bleeding.  The deep subcutaneous tissue was reapproximated with mattress sutures of 3-0 Vicryl to eliminate some of the dead space.  The superficial subcutaneous tissue was then reapproximated along the incision line with a running stitch of 3-0 Vicryl.  The skin was then reapproximated with a running subcuticular of 4-0 Monocryl.  The skin was then cleaned, dried, and reinforced with Dermabond.  The patient tolerated this procedure well.   COMPLICATIONS: none  CONDITION: stable  Leonides Sake, MD [facility] [place] Office: [phone] Pager: [phone]  04/23/2012, 1:58 PM

## 2012-04-23 NOTE — Progress Notes (Signed)
This patient was discharged from her skilled nursing facility with a uric tract infection, but she did not fill the antibiotic prescribed to her at that time. She presented to my clinic on Monday, 04/21/12 in which her UA appeared infected. I advised that she fill the antibiotic she was discharged from her nursing facility and start taking it. I also advised her to inform her surgeon regarding her urinary tract infection.  Her culture returned to my clinic today is positive for greater than 100,000 for Klebsiella pneumonia sensitive to multiple antibiotics. I consulted the pharmacist regarding dosing and it was recommended she be on Keflex 500 mg once daily based on her GFR.   I spoke with Dr. Hart Rochester regarding starting in antibiotic while in the hospital. This patient should be continued on his antibiotic for a total of 10 days. Please discharge the patient with this antibiotic.

## 2012-04-23 NOTE — H&P (Signed)
[facility] [place]  Brief History and Physical  History of Present Illness  [name] [name] is a 71 y.o. female who presents with chief complaint: end stage renal disease.  The patient presents today for R 2nd stage BVT.    Past Medical History  Diagnosis Date  . HYPOTHYROIDISM 02/17/2007    s/p surgical removal of goiter in 1997  . DIABETES MELLITUS, TYPE II 02/17/2007  . HYPERLIPIDEMIA 02/17/2007  . GOUT 05/29/2007  . ANEMIA-NOS 05/29/2007  . ANXIETY 03/23/2010  . 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 DEPRESSION 02/17/2007  . RESTLESS LEG SYNDROME 05/29/2007  . COMMON MIGRAINE 05/29/2007  . PERIPHERAL NEUROPATHY 05/29/2007  . HYPERTENSION 02/17/2007  . PERIPHERAL VASCULAR DISEASE 02/17/2007  . ASTHMATIC BRONCHITIS, ACUTE 10/08/2008  . COPD 05/29/2007  . PEPTIC ULCER DISEASE 05/29/2007  . Chronic kidney disease 02/17/2007    Followed by Dr. Lowell Guitar  . OSTEOPENIA 09/22/2009  . SEIZURE DISORDER 02/17/2007  . Memory loss 01/24/2010  . Personal history of colonic polyps 11/16/2009  . Palpitations 09/08/2010  . Other diseases of vocal cords 1996  . HEART MURMUR, HX OF 10/02/2010  . 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 Chronic neck pain 12/13/2010  . GERD (gastroesophageal reflux disease)     Past Surgical History  Procedure Date  . Left toe amputated 2006  . Bunionectomy 1980  . Goiter removal 1997  . Stress cardiolite 06/18/2006  . Tranthoracic echocardiogram 06/18/2006  . Electrocardiogram 05/29/2006  . Cholecystectomy   . Rotator cuff repair Left    Dr. Lajoyce Corners  . Av fistula placement 03/13/2012    Procedure: ARTERIOVENOUS (AV) FISTULA CREATION;  Surgeon: Fransisco Hertz, MD;  Location: [facility] OR;  Service: Vascular;  Laterality: Right;  . Eye surgery     bilateral cataract removal    History   Social History  . Marital Status: Single    Spouse Name: N/A    Number of Children: N/A  . Years of Education: N/A   Occupational History  . disabled     c-spine and back pain    Social History Main Topics  . Smoking status: Current Some Day Smoker -- 2.0 packs/day for 47 years    Types: Cigarettes  . Smokeless tobacco: Current User   Comment: pt states she wants to get a RX for the nic patch  . Alcohol Use: No  . Drug Use: No  . Sexually Active: Not on file   Other Topics Concern  . Not on file   Social History Narrative   Currently on disability for her back pain.  Lives alone, though has been thinking about moving to ALF/SNF recently.    Family History  Problem Relation Age of Onset  . Dementia Mother   . Coronary artery disease Other   . Hyperlipidemia Other   . Hypertension Other   . Ovarian cancer Other   . Stroke Other     No current facility-administered medications on file prior to encounter.   Current Outpatient Prescriptions on File Prior to Encounter  Medication Sig Dispense Refill  . allopurinol (ZYLOPRIM) 100 MG tablet Take 100 mg by mouth daily.       Marland Kitchen amLODipine (NORVASC) 5 MG tablet Take 1 tablet (5 mg total) by mouth daily.  90 tablet  3  . atorvastatin (LIPITOR) 40 MG tablet Take 1 tablet (40 mg total) by mouth daily.  30 tablet  11  . calcium carbonate, dosed in mg elemental calcium, 1250 MG/5ML Take 5 mLs (500  mg of elemental calcium total) by mouth every 6 (six) hours as needed for heartburn.  250 mL  0  . diphenhydrAMINE (BENADRYL) 25 mg capsule Take 25 mg by mouth every 6 (six) hours as needed. For itching      . DULoxetine (CYMBALTA) 60 MG capsule Take 60 mg by mouth daily.      . fluticasone (FLONASE) 50 MCG/ACT nasal spray Place 2 sprays into the nose daily.        . furosemide (LASIX) 80 MG tablet Take 160 mg by mouth daily.      Marland Kitchen levothyroxine (SYNTHROID, LEVOTHROID) 125 MCG tablet Take 125 mcg by mouth daily.       . mometasone-formoterol (DULERA) 100-5 MCG/ACT AERO Inhale 2 puffs into the lungs 2 (two) times daily.  1 Inhaler  6  . montelukast (SINGULAIR) 10 MG tablet Take 1 tablet (10 mg total) by mouth daily.  30  tablet  11  . omeprazole (PRILOSEC) 40 MG capsule Take 40 mg by mouth daily.       . traZODone (DESYREL) 100 MG tablet Take 100 mg by mouth at bedtime as needed. For sleep        Allergies  Allergen Reactions  . Nsaids     REACTION: renal dysfunction  . Pioglitazone     Actos REACTION: EDEMA    Review of Systems: As listed above, otherwise negative.  Physical Examination  Filed Vitals:   04/23/12 0848  BP: 131/80  Pulse: 68  Temp: 97.4 F (36.3 C)  TempSrc: Oral  Resp: 18  SpO2: 97%    General: A&O x 3, WDWN  Pulmonary: Sym exp, good air movt, CTAB, no rales, rhonchi, & wheezing  Cardiac: RRR, Nl S1, S2, no Murmurs, rubs or gallops  Gastrointestinal: soft, NTND, -G/R, - HSM, - masses, - CVAT B  Musculoskeletal: M/S 5/5 throughout , Extremities without ischemic changes , palpable thrill in R BVT  Laboratory See iStat  Medical Decision Making  [name] [name] is a 71 y.o. female who presents with: end stage renal disease, successful R 1st stage BVT.   The patient is scheduled for: R 2nd stage BVT  Risk, benefits, and alternatives to access surgery were discussed.  The patient is aware the risks include but are not limited to: bleeding, infection, steal syndrome, nerve damage, ischemic monomelic neuropathy, failure to mature, and need for additional procedures.  The patient is aware of the risks and agrees to proceed.  Leonides Sake, MD [facility] [place] Office: [phone] Pager: [phone]  04/23/2012, 8:22 AM

## 2012-04-23 NOTE — Transfer of Care (Signed)
Immediate Anesthesia Transfer of Care Note  Patient: [name] [name]  Procedure(s) Performed: Procedure(s) (LRB) with comments: BASCILIC VEIN TRANSPOSITION (Right) - 2ND STAGE  Patient Location: PACU  Anesthesia Type: General  Level of Consciousness: awake, alert , oriented and sedated  Airway & Oxygen Therapy: Patient Spontanous Breathing and Patient connected to nasal cannula oxygen  Post-op Assessment: Report given to PACU RN, Post -op Vital signs reviewed and stable and Patient moving all extremities  Post vital signs: Reviewed and stable  Complications: No apparent anesthesia complications

## 2012-04-23 NOTE — Progress Notes (Signed)
Pt assisted OOB to [facility] for first time post op; gait unsteady; pt assisted back to bed; bed alarm set; will cont. To monitor.

## 2012-04-24 ENCOUNTER — Telehealth: Payer: Self-pay | Admitting: Vascular Surgery

## 2012-04-24 LAB — BASIC METABOLIC PANEL
BUN: 73 mg/dL — ABNORMAL HIGH (ref 6–23)
CO2: 21 mEq/L (ref 19–32)
Calcium: 8.6 mg/dL (ref 8.4–10.5)
Creatinine, Ser: 4.97 mg/dL — ABNORMAL HIGH (ref 0.50–1.10)
GFR calc non Af Amer: 8 mL/min — ABNORMAL LOW (ref >90)
Glucose, Bld: 115 mg/dL — ABNORMAL HIGH (ref 70–99)

## 2012-04-24 LAB — CBC
HCT: 29.3 % — ABNORMAL LOW (ref 36.0–46.0)
Hemoglobin: 9.8 g/dL — ABNORMAL LOW (ref 12.0–15.0)
MCH: 30.4 pg (ref 26.0–34.0)
MCHC: 33.4 g/dL (ref 30.0–36.0)
MCV: 91 fL (ref 78.0–100.0)
RBC: 3.22 MIL/uL — ABNORMAL LOW (ref 3.87–5.11)

## 2012-04-24 MED ORDER — CEPHALEXIN 250 MG PO CAPS: 500.0000 mg | ORAL_CAPSULE | Freq: Every day | ORAL | Status: DC

## 2012-04-24 MED ORDER — HYDROCODONE-ACETAMINOPHEN 5-325 MG PO TABS: 1.0000 | ORAL_TABLET | ORAL | Status: DC | PRN

## 2012-04-24 NOTE — Discharge Summary (Signed)
[facility] Discharge Summary   Patient ID:  [name] [name] MRN: [id number] DOB/AGE: 72/26/1942 72 y.o.  Admit date: 04/23/2012 Discharge date: 04/24/2012 Date of Surgery: 04/23/2012 Surgeon: Surgeon(s): Fransisco Hertz, MD  Admission Diagnosis: ESRD  Discharge Diagnoses:  ESRD  Secondary Diagnoses: Past Medical History  Diagnosis Date  . HYPOTHYROIDISM 02/17/2007    s/p surgical removal of goiter in 1997  . DIABETES MELLITUS, TYPE II 02/17/2007  . HYPERLIPIDEMIA 02/17/2007  . GOUT 05/29/2007  . ANEMIA-NOS 05/29/2007  . ANXIETY 03/23/2010  . 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 DEPRESSION 02/17/2007  . RESTLESS LEG SYNDROME 05/29/2007  . COMMON MIGRAINE 05/29/2007  . PERIPHERAL NEUROPATHY 05/29/2007  . HYPERTENSION 02/17/2007  . PERIPHERAL VASCULAR DISEASE 02/17/2007  . ASTHMATIC BRONCHITIS, ACUTE 10/08/2008  . COPD 05/29/2007  . PEPTIC ULCER DISEASE 05/29/2007  . Chronic kidney disease 02/17/2007    Followed by Dr. Lowell Guitar  . OSTEOPENIA 09/22/2009  . SEIZURE DISORDER 02/17/2007  . Memory loss 01/24/2010  . Personal history of colonic polyps 11/16/2009  . Palpitations 09/08/2010  . Other diseases of vocal cords 1996  . HEART MURMUR, HX OF 10/02/2010  . 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 Chronic neck pain 12/13/2010  . GERD (gastroesophageal reflux disease)     Procedure(s): BASCILIC VEIN TRANSPOSITION  Discharged Condition: good  HPI: [name] [name] is a 71 y.o. female who presents with chief complaint: end stage renal disease. The patient presents today for R 2nd stage BVT.     Hospital Course:  [name] [name] is a 71 y.o. female is S/P Right Procedure(s): BASCILIC VEIN TRANSPOSITION Extubated: POD # 0 Post-op wounds clean, dry, intact or healing well Pt. Ambulating, voiding and taking PO diet without difficulty. Pt pain controlled with PO pain meds. Labs as below Complications:none  Consults:     Significant Diagnostic Studies: CBC Lab Results  Component Value  Date   WBC 9.4 04/24/2012   HGB 9.8* 04/24/2012   HCT 29.3* 04/24/2012   MCV 91.0 04/24/2012   PLT 209 04/24/2012    BMET    Component Value Date/Time   NA 138 04/24/2012 0435   K 4.1 04/24/2012 0435   CL 104 04/24/2012 0435   CO2 21 04/24/2012 0435   GLUCOSE 115* 04/24/2012 0435   BUN 73* 04/24/2012 0435   CREATININE 4.97* 04/24/2012 0435   CALCIUM 8.6 04/24/2012 0435   GFRNONAA 8* 04/24/2012 0435   GFRAA 9* 04/24/2012 0435   COAG Lab Results  Component Value Date   INR 1.09 02/23/2011     Disposition:  Discharge to :Home Discharge Orders    Future Appointments: Provider: Department: Dept Phone: Center:   05/01/2012 1:45 PM [facility] [phone] None   10/03/2012 1:30 PM Barbaraann Share, MD [facility] [phone] None     Future Orders Please Complete By Expires   Resume previous diet      Driving Restrictions      Comments:   No driving for 2 days   Lifting restrictions      Comments:   No lifting for 6 weeks   Call MD for:  temperature >100.5      Call MD for:  redness, tenderness, or signs of infection (pain, swelling, bleeding, redness, odor or green/yellow discharge around incision site)      Call MD for:  severe or increased pain, loss or decreased feeling  in affected limb(s)      Increase activity slowly  Comments:   Walk with assistance use walker or cane as needed   May shower       Scheduling Instructions:   MAY SHOWER WITH SOAP AND WATER STARTING TOMORROW      Feutz, Adhya L  Home Medication Instructions RUE:[id number]   Printed on:04/24/12 0819  Medication Information                    atorvastatin (LIPITOR) 40 MG tablet Take 1 tablet (40 mg total) by mouth daily.           fluticasone (FLONASE) 50 MCG/ACT nasal spray Place 2 sprays into the nose daily.             DULoxetine (CYMBALTA) 60 MG capsule Take 60 mg by mouth daily.           mometasone-formoterol (DULERA) 100-5 MCG/ACT AERO Inhale 2 puffs into  the lungs 2 (two) times daily.           montelukast (SINGULAIR) 10 MG tablet Take 1 tablet (10 mg total) by mouth daily.           amLODipine (NORVASC) 5 MG tablet Take 1 tablet (5 mg total) by mouth daily.           calcium carbonate, dosed in mg elemental calcium, 1250 MG/5ML Take 5 mLs (500 mg of elemental calcium total) by mouth every 6 (six) hours as needed for heartburn.           allopurinol (ZYLOPRIM) 100 MG tablet Take 100 mg by mouth daily.            levothyroxine (SYNTHROID, LEVOTHROID) 125 MCG tablet Take 125 mcg by mouth daily.            omeprazole (PRILOSEC) 40 MG capsule Take 40 mg by mouth daily.            diphenhydrAMINE (BENADRYL) 25 mg capsule Take 25 mg by mouth every 6 (six) hours as needed. For itching           promethazine (PHENERGAN) 25 MG tablet Take 25 mg by mouth every 8 (eight) hours as needed. For nausea/vomiting           pregabalin (LYRICA) 200 MG capsule Take 200 mg by mouth daily.           ferrous sulfate 325 (65 FE) MG tablet Take 325 mg by mouth 2 (two) times daily.           docusate sodium (COLACE) 100 MG capsule Take 100 mg by mouth 2 (two) times daily as needed. For constipation           sodium bicarbonate 650 MG tablet Take 650 mg by mouth daily.           cyclobenzaprine (FLEXERIL) 10 MG tablet Take 10 mg by mouth 3 (three) times daily as needed. For muscle spasms           aspirin EC 81 MG tablet Take 81 mg by mouth daily.           traZODone (DESYREL) 100 MG tablet Take 100 mg by mouth at bedtime as needed. For sleep           furosemide (LASIX) 80 MG tablet Take 160 mg by mouth daily.           albuterol (PROVENTIL HFA;VENTOLIN HFA) 108 (90 BASE) MCG/ACT inhaler Inhale 2 puffs into the lungs every 6 (six) hours as needed. For wheezing/SOB  clonazePAM (KLONOPIN) 2 MG tablet Take 2 mg by mouth 2 (two) times daily as needed. anxiety           hydrOXYzine (ATARAX/VISTARIL) 25 MG tablet Take 25 mg by mouth  every 6 (six) hours as needed. For nerves           cephALEXin (KEFLEX) 250 MG capsule Take 2 capsules (500 mg total) by mouth daily.           HYDROcodone-acetaminophen (NORCO/VICODIN) 5-325 MG per tablet Take 1 tablet by mouth every 4 (four) hours as needed for pain. For pain            Verbal and written Discharge instructions given to the patient. Wound care per Discharge AVS   Signed: Clinton Gallant [facility] 04/24/2012, 8:19 AM  Addendum  I have independently interviewed and examined the patient, and I agree with the physician assistant's discharge summary.  Patient had an uneventful right second stage basilic vein transposition and was admitted overnight as patient had no one to stay with her overnight to observe for any complications overnight.  The patient will follow up in the office in two weeks.  Leonides Sake, MD [facility] [place] Office: [phone] Pager: [phone]  04/24/2012, 8:36 AM

## 2012-04-24 NOTE — Progress Notes (Addendum)
[facility] [place]  Subjective  - POD #1  Right upper arm AV fistula transposition  Objective 106/61 77 98.4 F (36.9 C) (Oral) 17 94%  Intake/Output Summary (Last 24 hours) at 04/24/12 0808 Last data filed at 04/24/12 0400  Gross per 24 hour  Intake   1510 ml  Output    425 ml  Net   1085 ml    Good thrill palpated with distal radial pulse and no signs of steal in the right upper extremity.  Assessment/Planning: POD #1  D/C home with hydrocodone and keflex for UTI.  Clinton Gallant [facility] 04/24/2012 8:08 AM --  Laboratory Lab Results:  Basename 04/24/12 0435 04/23/12 1648  WBC 9.4 8.4  HGB 9.8* 10.1*  HCT 29.3* 30.1*  PLT 209 229   BMET  Basename 04/24/12 0435 04/23/12 1648 04/23/12 0857  NA 138 -- 141  K 4.1 -- 4.6  CL 104 -- --  CO2 21 -- --  GLUCOSE 115* -- 101*  BUN 73* -- --  CREATININE 4.97* 4.62* --  CALCIUM 8.6 -- --    COAG Lab Results  Component Value Date   INR 1.09 02/23/2011   No results found for this basename: PTT    Antibiotics Anti-infectives     Start     Dose/Rate Route Frequency Ordered Stop   04/24/12 1930   cephALEXin (KEFLEX) capsule 500 mg  Status:  Discontinued        500 mg Oral Daily 04/23/12 1843 04/23/12 2050   04/24/12 0000   cephALEXin (KEFLEX) 250 MG capsule        500 mg Oral Daily 04/24/12 0808     04/23/12 2200   cephALEXin (KEFLEX) capsule 250 mg        250 mg Oral 4 times daily 04/23/12 2049     04/22/12 1504   cefUROXime (ZINACEF) 1.5 g in dextrose 5 % 50 mL IVPB        1.5 g 100 mL/hr over 30 Minutes Intravenous 30 min pre-op 04/22/12 1504 04/23/12 1140         Addendum  I have independently interviewed and examined the patient, and I agree with the physician assistant's findings.  Palpable thrill, inc c/d/i  Leonides Sake, MD [facility]: [phone] Pager: [phone]  04/24/2012, 8:35 AM

## 2012-04-24 NOTE — Telephone Encounter (Addendum)
Message copied by Shari Prows on Thu Apr 24, 2012  4:13 PM ------      Message from: Melene Plan      Created: Thu Apr 24, 2012 11:18 AM                   ----- Message -----         From: Fransisco Hertz, MD         Sent: 04/23/2012   2:06 PM           To: Reuel Derby, Melene Plan, RN            Allan Bacigalupi Marcel      [id number]      1941/07/08            PROCEDURE:      right second stage basilic vein transposition (brachiobasilic arteriovenous fistula) placement            Asst: Lianne Cure, [facility]            Follow-up: 4 week            Pt will be admitted overnight  I scheduled an appt for the patient on 05/23/12 at 10:45am. Spoke w/ pt and also mailed letter.awt

## 2012-04-28 ENCOUNTER — Other Ambulatory Visit: Payer: Self-pay

## 2012-04-28 MED ORDER — MOMETASONE FURO-FORMOTEROL FUM 100-5 MCG/ACT IN AERO: 2.0000 | INHALATION_SPRAY | Freq: Two times a day (BID) | RESPIRATORY_TRACT | Status: DC

## 2012-04-30 ENCOUNTER — Other Ambulatory Visit (HOSPITAL_COMMUNITY): Payer: Self-pay | Admitting: *Deleted

## 2012-04-30 ENCOUNTER — Ambulatory Visit: Payer: Medicare Other | Admitting: Internal Medicine

## 2012-05-01 ENCOUNTER — Ambulatory Visit (INDEPENDENT_AMBULATORY_CARE_PROVIDER_SITE_OTHER): Payer: Medicare Other | Admitting: Internal Medicine

## 2012-05-01 ENCOUNTER — Encounter: Payer: Self-pay | Admitting: Vascular Surgery

## 2012-05-01 ENCOUNTER — Encounter (HOSPITAL_COMMUNITY)
Admission: RE | Admit: 2012-05-01 | Discharge: 2012-05-01 | Disposition: A | Discharge status: Home / Self Care | Payer: Medicare Other | Source: Ambulatory Visit | Attending: Nephrology | Admitting: Nephrology

## 2012-05-01 ENCOUNTER — Encounter: Payer: Self-pay | Admitting: Internal Medicine

## 2012-05-01 VITALS — BP 138/72 | HR 95 | Temp 97.5°F | Ht 67.5 in | Wt 176.5 lb

## 2012-05-01 DIAGNOSIS — J449 Chronic obstructive pulmonary disease, unspecified: Secondary | ICD-10-CM

## 2012-05-01 DIAGNOSIS — N185 Chronic kidney disease, stage 5: Secondary | ICD-10-CM | POA: Insufficient documentation

## 2012-05-01 MED ORDER — DARBEPOETIN ALFA-POLYSORBATE 100 MCG/0.5ML IJ SOLN: 100.0000 ug | INTRAMUSCULAR | Status: DC

## 2012-05-02 ENCOUNTER — Encounter: Payer: Self-pay | Admitting: Vascular Surgery

## 2012-05-02 ENCOUNTER — Ambulatory Visit (INDEPENDENT_AMBULATORY_CARE_PROVIDER_SITE_OTHER): Payer: Medicare Other | Admitting: Vascular Surgery

## 2012-05-02 VITALS — BP 144/83 | HR 82 | Temp 98.2°F | Ht 67.5 in | Wt 174.0 lb

## 2012-05-02 DIAGNOSIS — N186 End stage renal disease: Secondary | ICD-10-CM

## 2012-05-02 MED ORDER — CEPHALEXIN 500 MG PO CAPS: 500.0000 mg | ORAL_CAPSULE | Freq: Four times a day (QID) | ORAL | Status: DC

## 2012-05-02 NOTE — Progress Notes (Signed)
[facility] [place]  Postoperative Access Visit  History of Present Illness  [name] [name] is a 71 y.o. 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  The patient's wounds are healing.  The patient notes no steal symptoms.  The patient is not able to complete their activities of daily living due to pain.  The patient's current symptoms are: incision pain and some numbness in forearm.  Physical Examination  Filed Vitals:   05/02/12 1450  BP: 144/83  Pulse: 82  Temp: 98.2 F (36.8 C)   RUE: Incision is healing with arm of erythema around incision, skin feels warm, hand grip is 5/5, sensation in digits is intact in hand, palpable thrill, bruit can be auscultated   Medical Decision Making  [name] [name] is a 71 y.o. 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  This patient was counseled multiple times about the nature of the second stage of a BVT prior to the procedure.  Today she seems surprise with the length of the incision.  I explained again to the patient the nature of the transposition and the need to mobilize the fistula away from the nerves.  Her median antecubital sensory nerve was preserved so I suspect some degree of neuropraxia from the procedure.  I expect this to resolve with time.  The patient may have an early bout of cellulitis so I am prescribing her some Keflex 500 mg PO 1 PO qid x 10 days for this.    I am also adjusting her pain rx to Oxycodone 5 mg 1-2 PO q4-6 hr prn pain, #30 only.  I suspect she has some degree of tolerance to narcotics.  She has follow with me in a couple of weeks so I will see her at that time.  Thank you for allowing [place] to participate in this patient's care.  Leonides Sake, MD [facility] [place] Office: [phone] Pager: [phone]

## 2012-05-04 NOTE — Progress Notes (Signed)
  Subjective:    Patient ID: [name] [name], female    DOB: [birth year], 71 y.o.   MRN: [id number]  HPI  Pt left without being seen today due to scheduling conflict per pt    Review of Systems     Objective:   Physical Exam        Assessment & Plan:

## 2012-05-08 ENCOUNTER — Ambulatory Visit: Payer: Medicare Other | Admitting: Internal Medicine

## 2012-05-14 ENCOUNTER — Encounter (HOSPITAL_COMMUNITY)
Admission: RE | Admit: 2012-05-14 | Discharge: 2012-05-14 | Disposition: A | Discharge status: Home / Self Care | Payer: Medicare Other | Source: Ambulatory Visit | Attending: Nephrology | Admitting: Nephrology

## 2012-05-14 DIAGNOSIS — N185 Chronic kidney disease, stage 5: Secondary | ICD-10-CM | POA: Insufficient documentation

## 2012-05-14 MED ORDER — DARBEPOETIN ALFA-POLYSORBATE 100 MCG/0.5ML IJ SOLN: 100.0000 ug | INTRAMUSCULAR | Status: DC

## 2012-05-22 ENCOUNTER — Other Ambulatory Visit: Payer: Self-pay | Admitting: Internal Medicine

## 2012-05-22 ENCOUNTER — Encounter: Payer: Self-pay | Admitting: Vascular Surgery

## 2012-05-23 ENCOUNTER — Ambulatory Visit: Payer: Medicare Other | Admitting: Vascular Surgery

## 2012-05-27 ENCOUNTER — Telehealth: Payer: Self-pay

## 2012-05-27 NOTE — Telephone Encounter (Signed)
Pt. called and requested refill on Oxycodone.  Stated Dr. Imogene Burn changed her medication from Vicodin to Oxycodone.  Stated has continued numbness from elbow to wrist, and that this is not changed from evaluation on 05/02/12.  Explains that she had pain in "right arm pit that went into the back", and it has subsided.  Now c/o pain in "right neck to collar bone" that started about 2-3 weeks ago.  States pain is "constant".  Denies any injury to her right side of neck.  Denies any redness, swelling, or inflammation.  Has f/u appt with Dr. Imogene Burn on 05/30/12.  Advised pt. that nurse will consult w/ Dr. Imogene Burn re: pain symptoms. Pt. inquiring about need for a different appt. time on 10/25.  Will discuss change in appt. time on 10/25 w/ appt. Schedulers.

## 2012-05-28 ENCOUNTER — Inpatient Hospital Stay (HOSPITAL_COMMUNITY): Admission: RE | Admit: 2012-05-28 | Payer: Medicare Other | Source: Ambulatory Visit

## 2012-05-28 NOTE — Telephone Encounter (Signed)
Called pt. Back and informed her that Dr. Imogene Burn feels her neck pain is unrelated to her surgery on access site 04/23/12.  Encouraged her to call her PCP re: neck pain.  Pt. Verb. Understanding and agreed.

## 2012-05-28 NOTE — Telephone Encounter (Signed)
RE: pain symptoms       [name] Hertz, MD     Sent: Wed May 28, 2012  7:24 AM    To: [name] Simmonds Ashely Joshua, RN       [name] [name]    MRN: [id number] DOB: [birth year]    Pt Home: [phone]               Message     Unrelated to surgery

## 2012-05-29 ENCOUNTER — Encounter: Payer: Self-pay | Admitting: Vascular Surgery

## 2012-05-30 ENCOUNTER — Encounter: Payer: Self-pay | Admitting: Vascular Surgery

## 2012-05-30 ENCOUNTER — Ambulatory Visit (INDEPENDENT_AMBULATORY_CARE_PROVIDER_SITE_OTHER): Payer: Medicare Other | Admitting: Vascular Surgery

## 2012-05-30 VITALS — BP 157/89 | HR 111 | Resp 18 | Ht 67.5 in | Wt 171.0 lb

## 2012-05-30 DIAGNOSIS — N186 End stage renal disease: Secondary | ICD-10-CM

## 2012-05-30 NOTE — Progress Notes (Signed)
[facility] [place]   Postoperative Access Visit   History of Present Illness  Charleston L Ganger is a 71 y.o. 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 The patient's wounds are healing. The patient notes no steal symptoms. The patient is able to complete their activities of daily living.  The patient's current symptoms are: right neck pain and some numbness in right forearm.   Physical Examination  Filed Vitals:   05/30/12 0957  BP: 157/89  Pulse: 111  Resp: 18  Height: 5' 7.5" (1.715 m)  Weight: 171 lb (77.565 kg)   RUE: Incision is healed , skin feels warm, hand grip is 5/5, sensation in digits is intact in hand, palpable thrill, bruit can be auscultated   Medical Decision Making  Adja L Rosner is a 71 y.o. 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   Pt was very combative today.  She tried to blame her neck pain on her access procedure.  She complained that she did not understand what she was going into with both surgeries. I discussed with her her neck symptoms are completely unrelated the access procedure.  I also discussed with her that we have had multiple discussions prior to each of her procedures, as clearly documented in her medical record. We discussed again that if the numbness in her arm is due to a neuropraxia it should get better with time.  I discussed with her that if her numbness is due to transection of a cutaneous nerve as part of the process of dissection, the nerve will regenerate 1 mm/day if intact or if transected may never regenerate. She was again upset with the length of the incision.  I again discussed with her the incision was as long as I felt it needed to be to transpose the basilic vein. The matured fistula has been successfully transposed and can be used as needed at this point. Thank you for allowing [place] to participate in this patient's care.  Leonides Sake, MD  [facility] [place]  Office: [phone]  Pager: [phone]

## 2012-06-10 ENCOUNTER — Other Ambulatory Visit (HOSPITAL_COMMUNITY): Payer: Self-pay | Admitting: *Deleted

## 2012-06-11 ENCOUNTER — Encounter (HOSPITAL_COMMUNITY)
Admission: RE | Admit: 2012-06-11 | Discharge: 2012-06-11 | Disposition: A | Discharge status: Home / Self Care | Payer: Medicare Other | Source: Ambulatory Visit | Attending: Nephrology | Admitting: Nephrology

## 2012-06-11 DIAGNOSIS — N185 Chronic kidney disease, stage 5: Secondary | ICD-10-CM | POA: Insufficient documentation

## 2012-06-11 LAB — IRON AND TIBC
Iron: 42 ug/dL (ref 42–135)
Saturation Ratios: 20 % (ref 20–55)
TIBC: 208 ug/dL — ABNORMAL LOW (ref 250–470)

## 2012-06-11 MED ORDER — DARBEPOETIN ALFA-POLYSORBATE 200 MCG/0.4ML IJ SOLN: 200.0000 ug | INTRAMUSCULAR | Status: DC

## 2012-06-12 LAB — FERRITIN: Ferritin: 644 ng/mL — ABNORMAL HIGH (ref 10–291)

## 2012-06-19 ENCOUNTER — Other Ambulatory Visit: Payer: Self-pay | Admitting: Internal Medicine

## 2012-06-20 NOTE — Telephone Encounter (Signed)
Done hardcopy to robin  

## 2012-06-20 NOTE — Telephone Encounter (Signed)
Faxed hardcopy to pharmacy. 

## 2012-06-24 ENCOUNTER — Other Ambulatory Visit (HOSPITAL_COMMUNITY): Payer: Self-pay | Admitting: Urology

## 2012-06-24 DIAGNOSIS — N2889 Other specified disorders of kidney and ureter: Secondary | ICD-10-CM

## 2012-06-25 ENCOUNTER — Encounter (HOSPITAL_COMMUNITY)
Admission: RE | Admit: 2012-06-25 | Discharge: 2012-06-25 | Disposition: A | Discharge status: Home / Self Care | Payer: Medicare Other | Source: Ambulatory Visit | Attending: Nephrology | Admitting: Nephrology

## 2012-06-25 MED ORDER — DARBEPOETIN ALFA-POLYSORBATE 200 MCG/0.4ML IJ SOLN: 200.0000 ug | INTRAMUSCULAR | Status: DC

## 2012-06-26 ENCOUNTER — Other Ambulatory Visit: Payer: Self-pay | Admitting: Otolaryngology

## 2012-06-26 DIAGNOSIS — R1310 Dysphagia, unspecified: Secondary | ICD-10-CM

## 2012-07-01 ENCOUNTER — Ambulatory Visit (HOSPITAL_COMMUNITY)
Admission: RE | Admit: 2012-07-01 | Discharge: 2012-07-01 | Disposition: A | Discharge status: Home / Self Care | Payer: Medicare Other | Source: Ambulatory Visit | Attending: Urology | Admitting: Urology

## 2012-07-01 ENCOUNTER — Other Ambulatory Visit (HOSPITAL_COMMUNITY): Payer: Medicare Other

## 2012-07-01 DIAGNOSIS — N289 Disorder of kidney and ureter, unspecified: Secondary | ICD-10-CM | POA: Insufficient documentation

## 2012-07-01 DIAGNOSIS — N2889 Other specified disorders of kidney and ureter: Secondary | ICD-10-CM

## 2012-07-08 ENCOUNTER — Ambulatory Visit: Payer: Medicare Other | Admitting: Internal Medicine

## 2012-07-08 ENCOUNTER — Ambulatory Visit
Admission: RE | Admit: 2012-07-08 | Discharge: 2012-07-08 | Disposition: A | Discharge status: Home / Self Care | Payer: Medicare Other | Source: Ambulatory Visit | Attending: Otolaryngology | Admitting: Otolaryngology

## 2012-07-08 DIAGNOSIS — R1310 Dysphagia, unspecified: Secondary | ICD-10-CM

## 2012-07-09 ENCOUNTER — Encounter: Payer: Self-pay | Admitting: Internal Medicine

## 2012-07-09 ENCOUNTER — Encounter (HOSPITAL_COMMUNITY): Payer: Medicare Other

## 2012-07-09 ENCOUNTER — Telehealth: Payer: Self-pay | Admitting: Internal Medicine

## 2012-07-09 ENCOUNTER — Ambulatory Visit (INDEPENDENT_AMBULATORY_CARE_PROVIDER_SITE_OTHER): Payer: Medicare Other | Admitting: Internal Medicine

## 2012-07-09 VITALS — BP 154/78 | HR 99 | Temp 98.2°F | Ht 67.5 in | Wt 168.0 lb

## 2012-07-09 DIAGNOSIS — J449 Chronic obstructive pulmonary disease, unspecified: Secondary | ICD-10-CM

## 2012-07-09 DIAGNOSIS — M109 Gout, unspecified: Secondary | ICD-10-CM | POA: Insufficient documentation

## 2012-07-09 DIAGNOSIS — N2889 Other specified disorders of kidney and ureter: Secondary | ICD-10-CM

## 2012-07-09 DIAGNOSIS — E119 Type 2 diabetes mellitus without complications: Secondary | ICD-10-CM

## 2012-07-09 DIAGNOSIS — N289 Disorder of kidney and ureter, unspecified: Secondary | ICD-10-CM

## 2012-07-09 MED ORDER — METHYLPREDNISOLONE ACETATE 80 MG/ML IJ SUSP
120.0000 mg | Freq: Once | INTRAMUSCULAR | Status: AC
  Administered 2012-07-09: 120 mg via INTRAMUSCULAR

## 2012-07-09 MED ORDER — BENZONATATE 100 MG PO CAPS: ORAL_CAPSULE | ORAL | Status: DC

## 2012-07-09 MED ORDER — PREGABALIN 50 MG PO CAPS: 100.0000 mg | ORAL_CAPSULE | Freq: Every day | ORAL | Status: DC

## 2012-07-09 MED ORDER — PREDNISONE 10 MG PO TABS: ORAL_TABLET | ORAL | Status: DC

## 2012-07-09 NOTE — Assessment & Plan Note (Signed)
Right wrist, mild to mod and improveing over the last few days - for depomedrol IM, short cousre/low dose predpack,  to f/u any worsening symptoms or concerns

## 2012-07-09 NOTE — Progress Notes (Signed)
Subjective:    Patient ID: [name] [name], female    DOB: 1941/03/12, 71 y.o.   MRN: [id number]  HPI  Here with 3-4 days acute onset severe right wrist pain/swelling/redness initially severe now slightly less so, but still significant, and suggestive to her of an acute gout attack she has had in the past but not recently.  No fever, trauma, fall or other injury or overuse.  Pt denies chest pain, increased sob or doe, wheezing, orthopnea, PND, increased LE swelling, palpitations, dizziness or syncope.  Pt denies new neurological symptoms such as new headache, or facial or extremity weakness or numbness   Pt denies polydipsia, polyuria,  Pt states overall good compliance with meds,   Did have recent UGI series with esoph diminished peristaliss, and also recent MRI abd per Dr Woodruff/urology with suspicion for right renal neoplasm, pt has not yet heard results from urology, or suggestion of plan for any further tx. Asks for tessalon for recurring cough since this helped before.  Pt denies fever, wt loss, night sweats, loss of appetite, or other constitutional symptoms.  Asks for several med refills, and remarks her neuropathic pain persists, worse at night since she has not been taking cymbalta adn lyrica due to cost. Past Medical History  Diagnosis Date  . HYPOTHYROIDISM 02/17/2007    s/p surgical removal of goiter in 1997  . DIABETES MELLITUS, TYPE II 02/17/2007  . HYPERLIPIDEMIA 02/17/2007  . GOUT 05/29/2007  . ANEMIA-NOS 05/29/2007  . ANXIETY 03/23/2010  . 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 DEPRESSION 02/17/2007  . RESTLESS LEG SYNDROME 05/29/2007  . COMMON MIGRAINE 05/29/2007  . PERIPHERAL NEUROPATHY 05/29/2007  . HYPERTENSION 02/17/2007  . PERIPHERAL VASCULAR DISEASE 02/17/2007  . ASTHMATIC BRONCHITIS, ACUTE 10/08/2008  . COPD 05/29/2007  . PEPTIC ULCER DISEASE 05/29/2007  . Chronic kidney disease 02/17/2007    Followed by Dr. Lowell Guitar  . OSTEOPENIA 09/22/2009  . SEIZURE DISORDER 02/17/2007  . Memory loss  01/24/2010  . Personal history of colonic polyps 11/16/2009  . Palpitations 09/08/2010  . Other diseases of vocal cords 1996  . HEART MURMUR, HX OF 10/02/2010  . 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 Chronic neck pain 12/13/2010  . GERD (gastroesophageal reflux disease)    Past Surgical History  Procedure Date  . Left toe amputated 2006  . Bunionectomy 1980  . Goiter removal 1997  . Stress cardiolite 06/18/2006  . Tranthoracic echocardiogram 06/18/2006  . Electrocardiogram 05/29/2006  . Cholecystectomy   . Rotator cuff repair Left    Dr. Lajoyce Corners  . Av fistula placement 03/13/2012    Procedure: ARTERIOVENOUS (AV) FISTULA CREATION;  Surgeon: Fransisco Hertz, MD;  Location: [facility] OR;  Service: Vascular;  Laterality: Right;  . Eye surgery     bilateral cataract removal    reports that she has been smoking Cigarettes.  She has a 32.9 pack-year smoking history. She has never used smokeless tobacco. She reports that she does not drink alcohol or use illicit drugs. family history includes Coronary artery disease in her other; Dementia in her mother; Hyperlipidemia in her other; Hypertension in her other; Ovarian cancer in her other; and Stroke in her other. Allergies  Allergen Reactions  . Nsaids     REACTION: renal dysfunction  . Pioglitazone     Actos REACTION: EDEMA   Current Outpatient Prescriptions on File Prior to Visit  Medication Sig Dispense Refill  . albuterol (PROVENTIL HFA;VENTOLIN HFA) 108 (90 BASE) MCG/ACT inhaler Inhale 2 puffs into the lungs every 6 (six)  hours as needed. For wheezing/SOB      . allopurinol (ZYLOPRIM) 100 MG tablet Take 100 mg by mouth daily.       Marland Kitchen amLODipine (NORVASC) 5 MG tablet Take 1 tablet (5 mg total) by mouth daily.  90 tablet  3  . aspirin EC 81 MG tablet Take 81 mg by mouth daily.      Marland Kitchen atorvastatin (LIPITOR) 40 MG tablet Take 1 tablet (40 mg total) by mouth daily.  30 tablet  11  . calcium carbonate, dosed in mg elemental calcium, 1250 MG/5ML Take 5 mLs (500 mg of  elemental calcium total) by mouth every 6 (six) hours as needed for heartburn.  250 mL  0  . clonazePAM (KLONOPIN) 2 MG tablet Take 2 mg by mouth 2 (two) times daily as needed. anxiety      . cyclobenzaprine (FLEXERIL) 10 MG tablet Take 10 mg by mouth 3 (three) times daily as needed. For muscle spasms      . diphenhydrAMINE (BENADRYL) 25 mg capsule Take 25 mg by mouth every 6 (six) hours as needed. For itching      . docusate sodium (COLACE) 100 MG capsule Take 100 mg by mouth 2 (two) times daily as needed. For constipation      . DULoxetine (CYMBALTA) 60 MG capsule Take 60 mg by mouth daily.      . ferrous sulfate 325 (65 FE) MG tablet Take 325 mg by mouth 2 (two) times daily.      . fluticasone (FLONASE) 50 MCG/ACT nasal spray Place 2 sprays into the nose daily.        . furosemide (LASIX) 80 MG tablet Take 160 mg by mouth daily.      Marland Kitchen HYDROcodone-acetaminophen (NORCO/VICODIN) 5-325 MG per tablet TAKE ONE TABLET BY MOUTH 4 TIMES DAILY AS NEEDED FOR PAIN  120 tablet  1  . hydrOXYzine (ATARAX/VISTARIL) 25 MG tablet Take 25 mg by mouth every 6 (six) hours as needed. For nerves      . levothyroxine (SYNTHROID, LEVOTHROID) 112 MCG tablet       . mometasone-formoterol (DULERA) 100-5 MCG/ACT AERO Inhale 2 puffs into the lungs 2 (two) times daily.  1 Inhaler  6  . montelukast (SINGULAIR) 10 MG tablet Take 1 tablet (10 mg total) by mouth daily.  30 tablet  11  . omeprazole (PRILOSEC) 40 MG capsule Take 40 mg by mouth daily.       Marland Kitchen oxyCODONE (OXY IR/ROXICODONE) 5 MG immediate release tablet       . promethazine (PHENERGAN) 25 MG tablet Take 25 mg by mouth every 8 (eight) hours as needed. For nausea/vomiting      . sodium bicarbonate 650 MG tablet Take 650 mg by mouth daily.      . traZODone (DESYREL) 100 MG tablet Take 100 mg by mouth at bedtime as needed. For sleep      . [DISCONTINUED] allopurinol (ZYLOPRIM) 100 MG tablet TAKE ONE TABLET BY MOUTH EVERY DAY  90 tablet  3  . [DISCONTINUED]  levothyroxine (SYNTHROID, LEVOTHROID) 125 MCG tablet Take 125 mcg by mouth daily.        No current facility-administered medications on file prior to visit.   Review of Systems  Constitutional: Negative for diaphoresis and unexpected weight change.  HENT: Negative for tinnitus.   Eyes: Negative for photophobia and visual disturbance.  Respiratory: Negative for choking and stridor.   Gastrointestinal: Negative for vomiting and blood in stool.  Genitourinary: Negative for hematuria and  decreased urine volume.  Musculoskeletal: Negative for gait problem.  Skin: Negative for color change and wound.  Neurological: Negative for tremors and numbness.  Psychiatric/Behavioral: Negative for decreased concentration. The patient is not hyperactive.       Objective:   Physical Exam BP 154/78  Pulse 99  Temp 98.2 F (36.8 C) (Oral)  Ht 5' 7.5" (1.715 m)  Wt 168 lb (76.204 kg)  BMI 25.92 kg/m2  SpO2 95% Physical Exam  VS noted - afeb Constitutional: Pt appears well-developed and well-nourished. Lavella Lemons HENT: Head: Normocephalic.  Right Ear: External ear normal.  Left Ear: External ear normal.  Eyes: Conjunctivae and EOM are normal. Pupils are equal, round, and reactive to light.  Neck: Normal range of motion. Neck supple.  Cardiovascular: Normal rate and regular rhythm.   Pulmonary/Chest: Effort normal and breath sounds normal.  Abd:  Soft, NT, non-distended, + BS Neurological: Pt is alert. Motor intact Skin: Skin is warm. No erythema.  Right wrist with 1-2+ red/warm/tender/effusion with decreased ROM Psychiatric: Pt behavior is normal. Not depressed affect    Assessment & Plan:

## 2012-07-09 NOTE — Telephone Encounter (Signed)
Message copied by Corwin Levins on Wed Jul 09, 2012 12:23 PM ------      Message from: Scharlene Gloss B      Created: Wed Jul 09, 2012 11:15 AM       The patient does not see a psychiatrist.  She did state the trazodone for sleep is not working and could you send in an alternative

## 2012-07-09 NOTE — Assessment & Plan Note (Addendum)
D/w pt MRI since results were available, not realizing pt had not been related results yet per urology and there may be concern for malignancy right renal;  I asked and pt agreed to f/u with urology with a call to the office and f/u as recommended

## 2012-07-09 NOTE — Telephone Encounter (Signed)
Patient informed. 

## 2012-07-09 NOTE — Patient Instructions (Addendum)
You had the steroid shot today Take all new medications as prescribed - the lower dose/short course of prednisone (and the pills for cough) We will look for samples of the cymbalta, lyrica, and dulera Continue all other medications as before Please ask for a refill of the Seroquel from your psychiatrist as we have not prescribed this recently for you  You may need to ask Dr Lowell Guitar about the dose for a potassium pill, since we aren't able to tell you this today Please have the pharmacy call with any other refills you may need. Thank you for enrolling in MyChart. Please follow the instructions below to securely access your online medical record. MyChart allows you to send messages to your doctor, view your test results, renew your prescriptions, schedule appointments, and more. To Log into MyChart, please go to [email or web address], and your Username is: [email or web address] Please call Dr Hilario Quarry office to see if further evaluation or treatment is needed for the right kidney mass. Please keep your appointments with your specialists as you have planned  - Dr Lowell Guitar Please return in 6 months, or sooner if needed

## 2012-07-09 NOTE — Assessment & Plan Note (Signed)
stable overall by hx and exam, most recent data reviewed with pt, and pt to continue medical treatment as before Lab Results  Component Value Date   HGBA1C 7.2* 03/09/2012

## 2012-07-09 NOTE — Telephone Encounter (Signed)
I dont have any other good alternative as she is on so many other medications, including the high dose klonopin

## 2012-07-09 NOTE — Assessment & Plan Note (Signed)
stable overall by hx and exam, most recent data reviewed with pt, and pt to continue medical treatment as before SpO2 Readings from Last 3 Encounters:  07/09/12 95%  06/25/12 100%  05/02/12 99%

## 2012-07-18 ENCOUNTER — Encounter (HOSPITAL_BASED_OUTPATIENT_CLINIC_OR_DEPARTMENT_OTHER): Payer: Self-pay | Admitting: *Deleted

## 2012-07-18 NOTE — Progress Notes (Signed)
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

## 2012-07-21 NOTE — H&P (Signed)
PREOPERATIVE H&P  Chief Complaint: dysphagia  HPI: [name] [name] is a 71 y.o. female who presents for evaluation of trouble swallowing with food getting caught in her throat a level of cricoid cartilage. Barium swallow demonstrated a prominent cricopharyngeus muscle and perhaps a small Zenker's diverticulum developing . She's taken to the OR for botox injection and dilation.  Past Medical History  Diagnosis Date  . HYPOTHYROIDISM 02/17/2007    s/p surgical removal of goiter in 1997  . DIABETES MELLITUS, TYPE II 02/17/2007  . HYPERLIPIDEMIA 02/17/2007  . GOUT 05/29/2007  . ANEMIA-NOS 05/29/2007  . ANXIETY 03/23/2010  . 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 DEPRESSION 02/17/2007  . RESTLESS LEG SYNDROME 05/29/2007  . COMMON MIGRAINE 05/29/2007  . PERIPHERAL NEUROPATHY 05/29/2007  . HYPERTENSION 02/17/2007  . PERIPHERAL VASCULAR DISEASE 02/17/2007  . ASTHMATIC BRONCHITIS, ACUTE 10/08/2008  . COPD 05/29/2007  . PEPTIC ULCER DISEASE 05/29/2007  . Chronic kidney disease 02/17/2007    Followed by Dr. Lowell Guitar  . OSTEOPENIA 09/22/2009  . SEIZURE DISORDER 02/17/2007  . Memory loss 01/24/2010  . Personal history of colonic polyps 11/16/2009  . Palpitations 09/08/2010  . Other diseases of vocal cords 1996  . HEART MURMUR, HX OF 10/02/2010  . 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 Chronic neck pain 12/13/2010  . GERD (gastroesophageal reflux disease)   . 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 Left toe amputated 2006  . Bunionectomy 1980  . Goiter removal 1997  . Stress cardiolite 06/18/2006  . Tranthoracic echocardiogram 06/18/2006  . Electrocardiogram 05/29/2006  . Cholecystectomy   . Rotator cuff repair Left    Dr. Lajoyce Corners  . Av fistula placement 03/13/2012    Procedure: ARTERIOVENOUS (AV) FISTULA CREATION;  Surgeon: Fransisco Hertz, MD;  Location: [facility] OR;  Service: Vascular;  Laterality: Right;  . Eye surgery     bilateral cataract  removal   History   Social History  . Marital Status: Single    Spouse Name: N/A    Number of Children: N/A  . Years of Education: N/A   Occupational History  . disabled     c-spine and back pain   Social History Main Topics  . Smoking status: Current Some Day Smoker -- 0.2 packs/day for 47 years    Types: Cigarettes  . Smokeless tobacco: Never Used     Comment: pt waiting to purchase patches  . Alcohol Use: No  . Drug Use: No  . Sexually Active: Not on file   Other Topics Concern  . Not on file   Social History Narrative   Currently on disability for her back pain.  Lives alone, though has been thinking about moving to ALF/SNF recently.   Family History  Problem Relation Age of Onset  . Dementia Mother   . Coronary artery disease Other   . Hyperlipidemia Other   . Hypertension Other   . Ovarian cancer Other   . Stroke Other    Allergies  Allergen Reactions  . Nsaids     REACTION: renal dysfunction  . Pioglitazone     Actos REACTION: EDEMA   Prior to Admission medications   Medication Sig Start Date End Date Taking? Authorizing Provider  albuterol (PROVENTIL HFA;VENTOLIN HFA) 108 (90 BASE) MCG/ACT inhaler Inhale 2 puffs into the lungs every 6 (six) hours as needed. For wheezing/SOB 03/28/12 03/28/13  Barbaraann Share, MD  allopurinol (ZYLOPRIM) 100 MG tablet Take  100 mg by mouth daily.  01/04/12   Historical Provider, MD  amLODipine (NORVASC) 5 MG tablet Take 1 tablet (5 mg total) by mouth daily. 10/03/11 10/02/12  Corwin Levins, MD  aspirin EC 81 MG tablet Take 81 mg by mouth daily.    Historical Provider, MD  atorvastatin (LIPITOR) 40 MG tablet Take 1 tablet (40 mg total) by mouth daily. 05/30/11   Corwin Levins, MD  benzonatate (TESSALON PERLES) 100 MG capsule 1-2 tabs by mouth every 8 hrs as needed for cough 07/09/12   Corwin Levins, MD  calcium carbonate, dosed in mg elemental calcium, 1250 MG/5ML Take 5 mLs (500 mg of elemental calcium total) by mouth every 6 (six)  hours as needed for heartburn. 03/14/12   Catarina Hartshorn, MD  clonazePAM (KLONOPIN) 2 MG tablet Take 2 mg by mouth 2 (two) times daily as needed. anxiety    Historical Provider, MD  cyclobenzaprine (FLEXERIL) 10 MG tablet Take 10 mg by mouth 3 (three) times daily as needed. For muscle spasms    Historical Provider, MD  diphenhydrAMINE (BENADRYL) 25 mg capsule Take 25 mg by mouth every 6 (six) hours as needed. For itching    Historical Provider, MD  docusate sodium (COLACE) 100 MG capsule Take 100 mg by mouth 2 (two) times daily as needed. For constipation    Historical Provider, MD  DULoxetine (CYMBALTA) 60 MG capsule Take 60 mg by mouth daily.    Historical Provider, MD  ferrous sulfate 325 (65 FE) MG tablet Take 325 mg by mouth 2 (two) times daily.    Historical Provider, MD  fluticasone (FLONASE) 50 MCG/ACT nasal spray Place 2 sprays into the nose daily.      Historical Provider, MD  furosemide (LASIX) 80 MG tablet Take 160 mg by mouth daily. 03/14/12 03/14/13  Catarina Hartshorn, MD  HYDROcodone-acetaminophen (NORCO/VICODIN) 5-325 MG per tablet TAKE ONE TABLET BY MOUTH 4 TIMES DAILY AS NEEDED FOR PAIN 06/19/12   Corwin Levins, MD  hydrOXYzine (ATARAX/VISTARIL) 25 MG tablet Take 25 mg by mouth every 6 (six) hours as needed. For nerves    Historical Provider, MD  levothyroxine (SYNTHROID, LEVOTHROID) 112 MCG tablet  05/22/12   Historical Provider, MD  mometasone-formoterol (DULERA) 100-5 MCG/ACT AERO Inhale 2 puffs into the lungs 2 (two) times daily. 04/28/12   Corwin Levins, MD  montelukast (SINGULAIR) 10 MG tablet Take 1 tablet (10 mg total) by mouth daily. 09/28/11 09/27/12  Corwin Levins, MD  omeprazole (PRILOSEC) 40 MG capsule Take 40 mg by mouth daily.  02/15/12   Historical Provider, MD  oxyCODONE (OXY IR/ROXICODONE) 5 MG immediate release tablet  05/05/12   Historical Provider, MD  pregabalin (LYRICA) 50 MG capsule Take 2 capsules (100 mg total) by mouth at bedtime. 07/09/12   Corwin Levins, MD  promethazine  (PHENERGAN) 25 MG tablet Take 25 mg by mouth every 8 (eight) hours as needed. For nausea/vomiting    Historical Provider, MD  sodium bicarbonate 650 MG tablet Take 650 mg by mouth daily.    Historical Provider, MD  traZODone (DESYREL) 100 MG tablet Take 100 mg by mouth at bedtime as needed. For sleep 08/19/11   Sorin Luanne Bras, MD     Positive ROS: food getting caught in throat  All other systems have been reviewed and were otherwise negative with the exception of those mentioned in the HPI and as above.  Physical Exam: There were no vitals filed for this visit.  General:  Alert, no acute distress Oral: Normal oral mucosa and tonsils Nasal: Clear nasal passages. FOL reveals L vocal cord weakness Neck: No palpable adenopathy or thyroid nodules Ear: Ear canal is clear with normal appearing TMs Cardiovascular: Regular rate and rhythm, no murmur.  Respiratory: Clear to auscultation Neurologic: Alert and oriented x 3   Assessment/Plan: DYSPHAGIA Plan for Procedure(s): ESOPHAGOSCOPY WITH BOTOX INJECTION   Dillard Cannon, MD 07/21/2012 5:21 PM

## 2012-07-22 ENCOUNTER — Encounter (HOSPITAL_BASED_OUTPATIENT_CLINIC_OR_DEPARTMENT_OTHER): Payer: Self-pay | Admitting: Anesthesiology

## 2012-07-22 ENCOUNTER — Ambulatory Visit (HOSPITAL_BASED_OUTPATIENT_CLINIC_OR_DEPARTMENT_OTHER)
Admission: RE | Admit: 2012-07-22 | Discharge: 2012-07-23 | Disposition: A | Discharge status: Home / Self Care | Payer: Medicare Other | Source: Ambulatory Visit | Attending: Otolaryngology | Admitting: Otolaryngology

## 2012-07-22 ENCOUNTER — Encounter (HOSPITAL_BASED_OUTPATIENT_CLINIC_OR_DEPARTMENT_OTHER)
Admission: RE | Disposition: A | Discharge status: Home / Self Care | Payer: Self-pay | Source: Ambulatory Visit | Attending: Otolaryngology

## 2012-07-22 ENCOUNTER — Ambulatory Visit (HOSPITAL_BASED_OUTPATIENT_CLINIC_OR_DEPARTMENT_OTHER): Payer: Medicare Other | Admitting: Anesthesiology

## 2012-07-22 ENCOUNTER — Encounter (HOSPITAL_BASED_OUTPATIENT_CLINIC_OR_DEPARTMENT_OTHER): Payer: Self-pay | Admitting: *Deleted

## 2012-07-22 DIAGNOSIS — F411 Generalized anxiety disorder: Secondary | ICD-10-CM | POA: Insufficient documentation

## 2012-07-22 DIAGNOSIS — J4489 Other specified chronic obstructive pulmonary disease: Secondary | ICD-10-CM | POA: Insufficient documentation

## 2012-07-22 DIAGNOSIS — M542 Cervicalgia: Secondary | ICD-10-CM | POA: Insufficient documentation

## 2012-07-22 DIAGNOSIS — Z823 Family history of stroke: Secondary | ICD-10-CM | POA: Insufficient documentation

## 2012-07-22 DIAGNOSIS — I739 Peripheral vascular disease, unspecified: Secondary | ICD-10-CM | POA: Insufficient documentation

## 2012-07-22 DIAGNOSIS — Z9849 Cataract extraction status, unspecified eye: Secondary | ICD-10-CM | POA: Insufficient documentation

## 2012-07-22 DIAGNOSIS — Z992 Dependence on renal dialysis: Secondary | ICD-10-CM | POA: Insufficient documentation

## 2012-07-22 DIAGNOSIS — Z8249 Family history of ischemic heart disease and other diseases of the circulatory system: Secondary | ICD-10-CM | POA: Insufficient documentation

## 2012-07-22 DIAGNOSIS — Z8711 Personal history of peptic ulcer disease: Secondary | ICD-10-CM | POA: Insufficient documentation

## 2012-07-22 DIAGNOSIS — K222 Esophageal obstruction: Secondary | ICD-10-CM | POA: Insufficient documentation

## 2012-07-22 DIAGNOSIS — Z7982 Long term (current) use of aspirin: Secondary | ICD-10-CM | POA: Insufficient documentation

## 2012-07-22 DIAGNOSIS — G40909 Epilepsy, unspecified, not intractable, without status epilepticus: Secondary | ICD-10-CM | POA: Insufficient documentation

## 2012-07-22 DIAGNOSIS — K219 Gastro-esophageal reflux disease without esophagitis: Secondary | ICD-10-CM | POA: Insufficient documentation

## 2012-07-22 DIAGNOSIS — F329 Major depressive disorder, single episode, unspecified: Secondary | ICD-10-CM | POA: Insufficient documentation

## 2012-07-22 DIAGNOSIS — E119 Type 2 diabetes mellitus without complications: Secondary | ICD-10-CM | POA: Insufficient documentation

## 2012-07-22 DIAGNOSIS — N189 Chronic kidney disease, unspecified: Secondary | ICD-10-CM | POA: Insufficient documentation

## 2012-07-22 DIAGNOSIS — E785 Hyperlipidemia, unspecified: Secondary | ICD-10-CM | POA: Insufficient documentation

## 2012-07-22 DIAGNOSIS — Z860100 Personal history of colon polyps, unspecified: Secondary | ICD-10-CM | POA: Insufficient documentation

## 2012-07-22 DIAGNOSIS — Z9089 Acquired absence of other organs: Secondary | ICD-10-CM | POA: Insufficient documentation

## 2012-07-22 DIAGNOSIS — K225 Diverticulum of esophagus, acquired: Secondary | ICD-10-CM | POA: Insufficient documentation

## 2012-07-22 DIAGNOSIS — M109 Gout, unspecified: Secondary | ICD-10-CM | POA: Insufficient documentation

## 2012-07-22 DIAGNOSIS — G43909 Migraine, unspecified, not intractable, without status migrainosus: Secondary | ICD-10-CM | POA: Insufficient documentation

## 2012-07-22 DIAGNOSIS — E039 Hypothyroidism, unspecified: Secondary | ICD-10-CM | POA: Insufficient documentation

## 2012-07-22 DIAGNOSIS — J449 Chronic obstructive pulmonary disease, unspecified: Secondary | ICD-10-CM | POA: Insufficient documentation

## 2012-07-22 DIAGNOSIS — F3289 Other specified depressive episodes: Secondary | ICD-10-CM | POA: Insufficient documentation

## 2012-07-22 DIAGNOSIS — Z8041 Family history of malignant neoplasm of ovary: Secondary | ICD-10-CM | POA: Insufficient documentation

## 2012-07-22 DIAGNOSIS — I129 Hypertensive chronic kidney disease with stage 1 through stage 4 chronic kidney disease, or unspecified chronic kidney disease: Secondary | ICD-10-CM | POA: Insufficient documentation

## 2012-07-22 DIAGNOSIS — F17200 Nicotine dependence, unspecified, uncomplicated: Secondary | ICD-10-CM | POA: Insufficient documentation

## 2012-07-22 DIAGNOSIS — M899 Disorder of bone, unspecified: Secondary | ICD-10-CM | POA: Insufficient documentation

## 2012-07-22 HISTORY — DX: Adverse effect of unspecified anesthetic, initial encounter: T41.45XA

## 2012-07-22 HISTORY — DX: Other complications of anesthesia, initial encounter: T88.59XA

## 2012-07-22 HISTORY — PX: ESOPHAGOSCOPY W/ BOTOX INJECTION: SHX1533

## 2012-07-22 LAB — POCT I-STAT, CHEM 8
BUN: 71 mg/dL — ABNORMAL HIGH (ref 6–23)
Chloride: 107 mEq/L (ref 96–112)
Creatinine, Ser: 4.2 mg/dL — ABNORMAL HIGH (ref 0.50–1.10)
Glucose, Bld: 118 mg/dL — ABNORMAL HIGH (ref 70–99)
Potassium: 3.7 mEq/L (ref 3.5–5.1)

## 2012-07-22 SURGERY — EGD, WITH BOTULINUM TOXIN INJECTION
Anesthesia: General | Site: Throat | Wound class: Clean Contaminated

## 2012-07-22 MED ORDER — LIDOCAINE HCL (CARDIAC) 20 MG/ML IV SOLN
INTRAVENOUS | Status: DC | PRN
  Administered 2012-07-22: 50 mg via INTRAVENOUS

## 2012-07-22 MED ORDER — HYDROMORPHONE HCL PF 1 MG/ML IJ SOLN
0.2500 mg | INTRAMUSCULAR | Status: DC | PRN
  Administered 2012-07-22: 0.25 mg via INTRAVENOUS
  Administered 2012-07-22: 0.5 mg via INTRAVENOUS

## 2012-07-22 MED ORDER — PROPOFOL 10 MG/ML IV BOLUS
INTRAVENOUS | Status: DC | PRN
  Administered 2012-07-22: 100 mg via INTRAVENOUS
  Administered 2012-07-22: 30 mg via INTRAVENOUS

## 2012-07-22 MED ORDER — FENTANYL CITRATE 0.05 MG/ML IJ SOLN
INTRAMUSCULAR | Status: DC | PRN
  Administered 2012-07-22 (×2): 50 ug via INTRAVENOUS

## 2012-07-22 MED ORDER — ONABOTULINUMTOXINA 100 UNITS IJ SOLR
INTRAMUSCULAR | Status: DC | PRN
  Administered 2012-07-22: 65 [IU] via INTRAMUSCULAR

## 2012-07-22 MED ORDER — ACETAMINOPHEN 325 MG PO TABS
650.0000 mg | ORAL_TABLET | Freq: Four times a day (QID) | ORAL | Status: DC | PRN
  Administered 2012-07-22: 650 mg via ORAL

## 2012-07-22 MED ORDER — PANTOPRAZOLE SODIUM 40 MG PO TBEC
40.0000 mg | DELAYED_RELEASE_TABLET | Freq: Every day | ORAL | Status: DC
  Administered 2012-07-22: 40 mg via ORAL

## 2012-07-22 MED ORDER — PROPOFOL INFUSION 10 MG/ML OPTIME
INTRAVENOUS | Status: DC | PRN
  Administered 2012-07-22: 100 ug/kg/min via INTRAVENOUS

## 2012-07-22 MED ORDER — FUROSEMIDE 80 MG PO TABS
80.0000 mg | ORAL_TABLET | Freq: Two times a day (BID) | ORAL | Status: DC
  Administered 2012-07-22: 80 mg via ORAL

## 2012-07-22 MED ORDER — SUCCINYLCHOLINE CHLORIDE 20 MG/ML IJ SOLN
INTRAMUSCULAR | Status: DC | PRN
  Administered 2012-07-22: 100 mg via INTRAVENOUS

## 2012-07-22 MED ORDER — SODIUM CHLORIDE 0.9 % IJ SOLN
INTRAMUSCULAR | Status: DC | PRN
  Administered 2012-07-22: 2 mL

## 2012-07-22 MED ORDER — HYDROCODONE-ACETAMINOPHEN 5-325 MG PO TABS
1.0000 | ORAL_TABLET | ORAL | Status: DC | PRN
  Administered 2012-07-22 – 2012-07-23 (×3): 1 via ORAL

## 2012-07-22 MED ORDER — LACTATED RINGERS IV SOLN
INTRAVENOUS | Status: DC
  Administered 2012-07-22 (×2): via INTRAVENOUS

## 2012-07-22 MED ORDER — ONDANSETRON HCL 4 MG/2ML IJ SOLN
INTRAMUSCULAR | Status: DC | PRN
  Administered 2012-07-22: 4 mg via INTRAVENOUS

## 2012-07-22 MED ORDER — ASPIRIN 81 MG PO CHEW: 81.0000 mg | CHEWABLE_TABLET | Freq: Once | ORAL | Status: DC

## 2012-07-22 MED ORDER — MIDAZOLAM HCL 5 MG/5ML IJ SOLN
INTRAMUSCULAR | Status: DC | PRN
  Administered 2012-07-22: 1 mg via INTRAVENOUS

## 2012-07-22 MED ORDER — TRAZODONE HCL 100 MG PO TABS
100.0000 mg | ORAL_TABLET | Freq: Every day | ORAL | Status: DC
  Administered 2012-07-22: 100 mg via ORAL

## 2012-07-22 SURGICAL SUPPLY — 31 items
CANISTER SUCTION 1200CC (MISCELLANEOUS) ×2 IMPLANT
CATH ROBINSON RED A/P 12FR (CATHETERS) IMPLANT
CATH ROBINSON RED A/P 8FR (CATHETERS) IMPLANT
CLOTH BEACON ORANGE TIMEOUT ST (SAFETY) ×2 IMPLANT
CONT SPEC 4OZ CLIKSEAL STRL BL (MISCELLANEOUS) IMPLANT
GAUZE SPONGE 4X4 12PLY STRL LF (GAUZE/BANDAGES/DRESSINGS) ×4 IMPLANT
GLOVE INDICATOR 7.0 STRL GRN (GLOVE) ×1 IMPLANT
GLOVE SS BIOGEL STRL SZ 7.5 (GLOVE) ×1 IMPLANT
GLOVE SUPERSENSE BIOGEL SZ 7.5 (GLOVE) ×1
GOWN PREVENTION PLUS XLARGE (GOWN DISPOSABLE) ×1 IMPLANT
GOWN PREVENTION PLUS XXLARGE (GOWN DISPOSABLE) IMPLANT
GUARD TEETH (MISCELLANEOUS) ×1 IMPLANT
NDL SAFETY ECLIPSE 18X1.5 (NEEDLE) ×1 IMPLANT
NDL SPNL 22GX7 QUINCKE BK (NEEDLE) ×1 IMPLANT
NEEDLE HYPO 18GX1.5 SHARP (NEEDLE) ×2
NEEDLE SPNL 22GX7 QUINCKE BK (NEEDLE) ×2 IMPLANT
NS IRRIG 1000ML POUR BTL (IV SOLUTION) ×2 IMPLANT
PAD ALCOHOL SWAB (MISCELLANEOUS) ×1 IMPLANT
PATTIES SURGICAL .5 X3 (DISPOSABLE) IMPLANT
SHEET MEDIUM DRAPE 40X70 STRL (DRAPES) ×2 IMPLANT
SLEEVE SCD COMPRESS KNEE MED (MISCELLANEOUS) ×2 IMPLANT
SOLUTION BUTLER CLEAR DIP (MISCELLANEOUS) ×1 IMPLANT
SURGILUBE 2OZ TUBE FLIPTOP (MISCELLANEOUS) ×2 IMPLANT
SUT SILK 3 0 TIES 17X18 (SUTURE)
SUT SILK 3-0 18XBRD TIE BLK (SUTURE) IMPLANT
SYR 5ML LL (SYRINGE) ×1 IMPLANT
SYR CONTROL 10ML LL (SYRINGE) ×2 IMPLANT
SYR TB 1ML LL NO SAFETY (SYRINGE) ×2 IMPLANT
TOWEL OR 17X24 6PK STRL BLUE (TOWEL DISPOSABLE) ×2 IMPLANT
TUBE CONNECTING 20X1/4 (TUBING) ×2 IMPLANT
WATER STERILE IRR 1000ML POUR (IV SOLUTION) ×1 IMPLANT

## 2012-07-22 NOTE — Anesthesia Postprocedure Evaluation (Signed)
  Anesthesia Post-op Note  Patient: [name] [name]  Procedure(s) Performed: Procedure(s) (LRB) with comments: ESOPHAGOSCOPY WITH BOTOX INJECTION (N/A) - esophageal dilation  Patient Location: PACU  Anesthesia Type:General  Level of Consciousness: awake  Airway and Oxygen Therapy: Patient Spontanous Breathing  Post-op Pain: mild  Post-op Assessment: Post-op Vital signs reviewed  Post-op Vital Signs: Reviewed  Complications: No apparent anesthesia complications

## 2012-07-22 NOTE — Brief Op Note (Signed)
07/22/2012  11:26 AM  PATIENT:  [name] [name]  71 y.o. female  PRE-OPERATIVE DIAGNOSIS:  DYSPHAGIA  POST-OPERATIVE DIAGNOSIS:  DYSPHAGIA  PROCEDURE:  Procedure(s) (LRB) with comments: ESOPHAGOSCOPY WITH BOTOX INJECTION (N/A) - esophageal dilation  SURGEON:  Surgeon(s) and Role:    * Drema Halon, MD - Primary  PHYSICIAN ASSISTANT:   ASSISTANTS: none   ANESTHESIA:   general  EBL:  Total I/O In: 200 [I.V.:200] Out: -   BLOOD ADMINISTERED:none  DRAINS: none   LOCAL MEDICATIONS USED:  NONE  SPECIMEN:  No Specimen  DISPOSITION OF SPECIMEN:  N/A  COUNTS:  YES  TOURNIQUET:  * No tourniquets in log *  DICTATION: .Other Dictation: Dictation Number [id number]  PLAN OF CARE: Discharge to home after PACU  PATIENT DISPOSITION:  PACU - hemodynamically stable.   Delay start of Pharmacological VTE agent (>24hrs) due to surgical blood loss or risk of bleeding: not applicable

## 2012-07-22 NOTE — Anesthesia Preprocedure Evaluation (Signed)
Anesthesia Evaluation  Patient identified by MRN, date of birth, ID band Patient awake    Reviewed: Allergy & Precautions, H&P , NPO status   Airway Mallampati: II      Dental   Pulmonary COPD breath sounds clear to auscultation        Cardiovascular hypertension, Pt. on medications + Peripheral Vascular Disease Rhythm:Regular Rate:Normal     Neuro/Psych  Headaches, Seizures -,  Anxiety Depression    GI/Hepatic PUD, GERD-  ,  Endo/Other  diabetesHypothyroidism   Renal/GU Renal disease     Musculoskeletal   Abdominal   Peds  Hematology   Anesthesia Other Findings   Reproductive/Obstetrics                           Anesthesia Physical Anesthesia Plan  ASA: III  Anesthesia Plan: General   Post-op Pain Management:    Induction: Intravenous  Airway Management Planned: Oral ETT  Additional Equipment:   Intra-op Plan:   Post-operative Plan: Extubation in OR  Informed Consent: I have reviewed the patients History and Physical, chart, labs and discussed the procedure including the risks, benefits and alternatives for the proposed anesthesia with the patient or authorized representative who has indicated his/her understanding and acceptance.   Dental advisory given  Plan Discussed with: CRNA, Anesthesiologist and Surgeon  Anesthesia Plan Comments:         Anesthesia Quick Evaluation

## 2012-07-22 NOTE — Progress Notes (Signed)
Dr. Ivin Booty at bedside and explained to pt that she had to stay the night tonight if she did not have any one to stay the night with her. He explained it is to keep her safe. Told Dr.Crews that her friend Ladean Raya was not in the lobby. Pt stated I know she is in the lobby -- I told her I went and called her name several times and she is not in the lobby. Asked pt is someone from her church would stay with her -- she said everyone has their own agenda. Explained to Dr. Ivin Booty the above and he said she has to stay if no one here for her. She can not stay by  Herself. Called and left message with Ladean Raya Munnings to call us here so we could talk with her to see if you she could stay with pt tonight.

## 2012-07-22 NOTE — Anesthesia Procedure Notes (Signed)
Procedure Name: Intubation Date/Time: 07/22/2012 10:21 AM Performed by: Burna Cash Pre-anesthesia Checklist: Patient identified, Emergency Drugs available, Suction available and Patient being monitored Patient Re-evaluated:Patient Re-evaluated prior to inductionOxygen Delivery Method: Circle System Utilized Preoxygenation: Pre-oxygenation with 100% oxygen Intubation Type: IV induction Ventilation: Mask ventilation without difficulty Grade View: Grade I Tube type: Oral Tube size: 6.5 mm Number of attempts: 1 Airway Equipment and Method: stylet and oral airway Placement Confirmation: ETT inserted through vocal cords under direct vision,  positive ETCO2 and breath sounds checked- equal and bilateral Secured at: 21 cm Tube secured with: Tape Dental Injury: Teeth and Oropharynx as per pre-operative assessment

## 2012-07-22 NOTE — Progress Notes (Signed)
Spoke with Dr. Randa Evens about pt not having someone to stay with her tonight at home. Pt said she came here by [facility] and that a friend was to pick her up between 12- 12:15pm today. Dr. Randa Evens said we need to discuss with Dr. Ivin Booty and  Have Dr. Ezzard Standing write orders for her to stay. Spoke with Dr. Narda Bonds that pt did not have any one to stay with her tonight -- he said just put orders in or have anesthesia put orders in.

## 2012-07-22 NOTE — Progress Notes (Signed)
Patient's friend Ladean Raya present with patient but states she has an appointment and cannot stay with patient. Spoke with Dr. Ezzard Standing and obtained minimal orders for overnight stay in [facility] if patient cannot arrange someone to stay with her after discharge. Pt.continues to ask to just go home, but not willing to sign AMA form. Dr. Ivin Booty contacted to come in to discuss again with patient and friend.

## 2012-07-22 NOTE — Progress Notes (Signed)
Patient will be admitted over night for observation as she has no one to stay with her at home. She's afebrile C/o sore throat but able to swallow OK Will advance diet as tolerated and plan discharge in AM

## 2012-07-22 NOTE — Transfer of Care (Signed)
Immediate Anesthesia Transfer of Care Note  Patient: [name] [name]  Procedure(s) Performed: Procedure(s) (LRB) with comments: ESOPHAGOSCOPY WITH BOTOX INJECTION (N/A) - esophageal dilation  Patient Location: PACU  Anesthesia Type:General  Level of Consciousness: awake, alert  and oriented  Airway & Oxygen Therapy: Patient Spontanous Breathing and Patient connected to face mask oxygen  Post-op Assessment: Report given to PACU RN and Post -op Vital signs reviewed and stable  Post vital signs: Reviewed and stable  Complications: No apparent anesthesia complications

## 2012-07-22 NOTE — Progress Notes (Signed)
Pt to spend the night in [facility].

## 2012-07-22 NOTE — Interval H&P Note (Signed)
History and Physical Interval Note:  07/22/2012 10:02 AM  [name] [name]  has presented today for surgery, with the diagnosis of DYSPHAGIA  The various methods of treatment have been discussed with the patient and family. After consideration of risks, benefits and other options for treatment, the patient has consented to  Procedure(s) (LRB) with comments: ESOPHAGOSCOPY WITH BOTOX INJECTION (N/A) as a surgical intervention .  The patient's history has been reviewed, patient examined, no change in status, stable for surgery.  I have reviewed the patient's chart and labs.  Questions were answered to the patient's satisfaction.     NEWMAN, CHRISTOPHER

## 2012-07-23 ENCOUNTER — Encounter (HOSPITAL_BASED_OUTPATIENT_CLINIC_OR_DEPARTMENT_OTHER): Payer: Self-pay | Admitting: Otolaryngology

## 2012-07-23 NOTE — Op Note (Signed)
[name], RAETHER                 ACCOUNT NO.:  [id number]  MEDICAL RECORD NO.:  [id number]  LOCATION:                                 FACILITY:  PHYSICIAN:  Kristine Garbe. 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  OPERATION PERFORMED:  Cervical esophagoscopy with dilation with Savary dilators to 18 mm.  Botox injection into the cricopharyngeus muscle, 65 units.  SURGEON:  Kristine Garbe. Ezzard Standing, M.D.  ANESTHESIA:  General endotracheal.  COMPLICATIONS:  None.  BRIEF CLINICAL NOTE:  [name] [name] is a 71 year old female who has been having some problems with swallowing with food, tap was getting caught in the proximal level of the cricoid cartilage.  She underwent a barium swallow, which demonstrated a very prominent cricopharyngeus muscle with small development of a Zenker diverticulum.  Because of her trouble with swallowing, she was taken to operating room at this time for dilation and Botox injection to the cricopharyngeus muscle.  DESCRIPTION OF PROCEDURE:  After adequate endotracheal anesthesia, first, direct laryngoscopy was performed by direct laryngoscopy.  Base of tongue, epiglottis, false and true cords were all clear.  Piriform sinuses were clear.  Next, the cervical esophagoscope was passed down the piriform region and it was little bit difficult to adequately identify the opening of the cervical esophagus.  First using the 6-mm Savary dilator, this was passed down to the upper esophagus, and at this point, I was able to identify the opening of the upper esophageal sphincter as well as the  cricopharyngeus muscle.  Approximately 20 units of Botox was injected into the right side of cricopharyngeus muscle and then another 20 units injected to the left side of the cricopharyngeus muscle and at the end, 20-25 units were injected into the inferior or posterior aspect of the cricopharyngeus muscle.  Following injection of the cricopharyngeus muscle, the guidewire and the Savary dilators were passed through the upper esophageal sphincter, and dilation of the upper esophageal sphincter was begun with the 8-mm dilator and subsequent dilation was performed using finally the 18-mm Savary dilator. Following dilation with 18-mm Savary dilator, cervical esophagoscopy was performed, and the cervical esophagoscope was easily passed through the upper esophageal sphincter at this point.  The mucosa of the upper cervical esophagus down to approximately 24 mm was normal to evaluation with no abnormalities noted.  This completed the procedure.  The patient was subsequently awakened from anesthesia, and transferred to recovery room, postop doing well.  DISPOSITION:  [name] was discharged home later this morning.  We will resume her normal medications and will advance the diet as tolerated. She will follow up in my office in 3 days for recheck.          ______________________________ Kristine Garbe. Ezzard Standing, M.D.     CEN/MEDQ  D:  07/22/2012  T:  07/22/2012  Job:  [id number]  cc:   Corwin Levins, MD

## 2012-08-14 ENCOUNTER — Telehealth: Payer: Self-pay | Admitting: Internal Medicine

## 2012-08-14 NOTE — Telephone Encounter (Signed)
Called the patient and she received a phone call from a company trying to sell her liquid oxygen.  They stated would help her and all her medical problems.  She would like MD advisement if this is something she should take.  Please advise

## 2012-08-14 NOTE — Telephone Encounter (Signed)
I'm not sure what to say except if she is doing well with what she has, I dont see a reason to change

## 2012-08-14 NOTE — Telephone Encounter (Signed)
Patient informed. 

## 2012-08-14 NOTE — Telephone Encounter (Signed)
The patient called the triage line hoping to discuss options for oxygen with the nurse.  Her callback number she left is [phone].

## 2012-08-24 ENCOUNTER — Other Ambulatory Visit: Payer: Self-pay | Admitting: Internal Medicine

## 2012-08-25 NOTE — Telephone Encounter (Signed)
Faxed hardcopy to pharmacy. 

## 2012-08-25 NOTE — Telephone Encounter (Signed)
Done hardcopy to robin  

## 2012-08-26 ENCOUNTER — Ambulatory Visit: Payer: Medicare Other | Admitting: Internal Medicine

## 2012-08-26 DIAGNOSIS — Z0289 Encounter for other administrative examinations: Secondary | ICD-10-CM

## 2012-09-08 ENCOUNTER — Other Ambulatory Visit: Payer: Self-pay | Admitting: Internal Medicine

## 2012-09-08 MED ORDER — CLONAZEPAM 2 MG PO TABS: 2.0000 mg | ORAL_TABLET | Freq: Two times a day (BID) | ORAL | Status: DC | PRN

## 2012-09-08 MED ORDER — TRAZODONE HCL 100 MG PO TABS: 100.0000 mg | ORAL_TABLET | Freq: Every evening | ORAL | Status: DC | PRN

## 2012-09-08 NOTE — Telephone Encounter (Signed)
The patient states PCP's name is on her bottles please advise

## 2012-09-08 NOTE — Telephone Encounter (Signed)
Chart reviewed again-   Done hardcopy to robin

## 2012-09-08 NOTE — Telephone Encounter (Signed)
Pt to reqeust refills from her psychiatrist as i dont see where I have been the prescriber for these meds in the recent past (I think she has been seeing psychiatry)

## 2012-09-09 NOTE — Telephone Encounter (Signed)
Faxed hardcopy to pharmacy. 

## 2012-10-03 ENCOUNTER — Ambulatory Visit: Payer: Medicare Other | Admitting: Pulmonary Disease

## 2012-10-06 ENCOUNTER — Telehealth: Payer: Self-pay | Admitting: Internal Medicine

## 2012-10-06 NOTE — Telephone Encounter (Signed)
Re'd from [employer] forward 1 page to DR.Jonny Ruiz

## 2012-10-07 ENCOUNTER — Telehealth: Payer: Self-pay | Admitting: *Deleted

## 2012-10-07 ENCOUNTER — Encounter: Payer: Self-pay | Admitting: Internal Medicine

## 2012-10-07 ENCOUNTER — Ambulatory Visit (INDEPENDENT_AMBULATORY_CARE_PROVIDER_SITE_OTHER): Payer: Medicare Other | Admitting: Internal Medicine

## 2012-10-07 ENCOUNTER — Telehealth: Payer: Self-pay | Admitting: Internal Medicine

## 2012-10-07 ENCOUNTER — Other Ambulatory Visit (INDEPENDENT_AMBULATORY_CARE_PROVIDER_SITE_OTHER): Payer: Medicare Other

## 2012-10-07 VITALS — BP 138/72 | HR 92 | Temp 98.1°F | Wt 153.0 lb

## 2012-10-07 DIAGNOSIS — R109 Unspecified abdominal pain: Secondary | ICD-10-CM

## 2012-10-07 DIAGNOSIS — I10 Essential (primary) hypertension: Secondary | ICD-10-CM

## 2012-10-07 DIAGNOSIS — C649 Malignant neoplasm of unspecified kidney, except renal pelvis: Secondary | ICD-10-CM

## 2012-10-07 DIAGNOSIS — G4700 Insomnia, unspecified: Secondary | ICD-10-CM

## 2012-10-07 DIAGNOSIS — E119 Type 2 diabetes mellitus without complications: Secondary | ICD-10-CM

## 2012-10-07 HISTORY — DX: Malignant neoplasm of unspecified kidney, except renal pelvis: C64.9

## 2012-10-07 LAB — CBC WITH DIFFERENTIAL/PLATELET
Basophils Absolute: 0 10*3/uL (ref 0.0–0.1)
Eosinophils Absolute: 0.2 10*3/uL (ref 0.0–0.7)
Hemoglobin: 10.5 g/dL — ABNORMAL LOW (ref 12.0–15.0)
Lymphocytes Relative: 23.2 % (ref 12.0–46.0)
MCHC: 32.9 g/dL (ref 30.0–36.0)
Monocytes Relative: 6.3 % (ref 3.0–12.0)
Neutro Abs: 5.5 10*3/uL (ref 1.4–7.7)
Neutrophils Relative %: 67.9 % (ref 43.0–77.0)
Platelets: 250 10*3/uL (ref 150.0–400.0)
RDW: 16 % — ABNORMAL HIGH (ref 11.5–14.6)

## 2012-10-07 LAB — URINALYSIS, ROUTINE W REFLEX MICROSCOPIC
Ketones, ur: NEGATIVE
Nitrite: NEGATIVE
Specific Gravity, Urine: 1.025 (ref 1.000–1.030)
Urobilinogen, UA: 0.2 (ref 0.0–1.0)
pH: 6 (ref 5.0–8.0)

## 2012-10-07 LAB — BASIC METABOLIC PANEL
CO2: 19 mEq/L (ref 19–32)
Calcium: 9.3 mg/dL (ref 8.4–10.5)
Chloride: 103 mEq/L (ref 96–112)
Glucose, Bld: 97 mg/dL (ref 70–99)
Sodium: 136 mEq/L (ref 135–145)

## 2012-10-07 LAB — HEPATIC FUNCTION PANEL
ALT: 11 U/L (ref 0–35)
Albumin: 4 g/dL (ref 3.5–5.2)
Alkaline Phosphatase: 57 U/L (ref 39–117)
Bilirubin, Direct: 0.1 mg/dL (ref 0.0–0.3)
Total Protein: 7.6 g/dL (ref 6.0–8.3)

## 2012-10-07 LAB — LIPASE: Lipase: 29 U/L (ref 11.0–59.0)

## 2012-10-07 LAB — HEMOGLOBIN A1C: Hgb A1c MFr Bld: 6.5 % (ref 4.6–6.5)

## 2012-10-07 MED ORDER — CEPHALEXIN 500 MG PO CAPS: 500.0000 mg | ORAL_CAPSULE | Freq: Four times a day (QID) | ORAL | Status: DC

## 2012-10-07 MED ORDER — QUETIAPINE FUMARATE 100 MG PO TABS: 100.0000 mg | ORAL_TABLET | Freq: Every day | ORAL | Status: DC

## 2012-10-07 NOTE — Patient Instructions (Addendum)
OK to stop the klonopin OK to start the seroquel at night Please go to the LAB in the Basement (turn left off the elevator) for the tests to be done today You will be contacted by phone if any changes need to be made immediately.  Otherwise, you will receive a letter about your results with an explanation, but please check with MyChart first. Thank you for enrolling in MyChart. Please follow the instructions below to securely access your online medical record. MyChart allows you to send messages to your doctor, view your test results, renew your prescriptions, schedule appointments, and more. To Log into My Chart online, please go by [employer] or [employer] to [email or web address], or download the MyChart App from the [employer] of [employer] .  Your Username is: pumpkin (password:  [phone]) Please keep your appointments with your specialists as you have planned - urology and renal

## 2012-10-07 NOTE — Telephone Encounter (Signed)
Pt with known ersd

## 2012-10-07 NOTE — Progress Notes (Signed)
Subjective:    Patient ID: [name] [name], female    DOB: [birth year], 72 y.o.   MRN: [id number]  HPI  Here to f/u, had right renal biopsy at [facility] [place] - + for renal cancer per pt, just found out this AM, has been offered surgury which she wants to do.  Also seeing Dr Powell/renal for ESRD and HTN.  Does have some pain to right abd/side since the biopsy, had an episode of nausea and diarrhea one day after with sweats but resolved in 1 day.  Denies urinary symptoms such as dysuria, frequency, urgency, flank pain, hematuria.  Appetite been lower recently, lost wt now down to 153, from 168 last dec 4.  Pt denies chest pain, increased sob or doe, wheezing, orthopnea, PND, increased LE swelling, palpitations, dizziness or syncope.. Denies hyper or hypo thyroid symptoms such as voice, skin or hair change. Past Medical History  Diagnosis Date  . HYPOTHYROIDISM 02/17/2007    s/p surgical removal of goiter in 1997  . DIABETES MELLITUS, TYPE II 02/17/2007  . HYPERLIPIDEMIA 02/17/2007  . GOUT 05/29/2007  . ANEMIA-NOS 05/29/2007  . ANXIETY 03/23/2010  . 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 DEPRESSION 02/17/2007  . RESTLESS LEG SYNDROME 05/29/2007  . COMMON MIGRAINE 05/29/2007  . PERIPHERAL NEUROPATHY 05/29/2007  . HYPERTENSION 02/17/2007  . PERIPHERAL VASCULAR DISEASE 02/17/2007  . ASTHMATIC BRONCHITIS, ACUTE 10/08/2008  . COPD 05/29/2007  . PEPTIC ULCER DISEASE 05/29/2007  . Chronic kidney disease 02/17/2007    Followed by Dr. Lowell Guitar  . OSTEOPENIA 09/22/2009  . SEIZURE DISORDER 02/17/2007  . Memory loss 01/24/2010  . Personal history of colonic polyps 11/16/2009  . Palpitations 09/08/2010  . Other diseases of vocal cords 1996  . HEART MURMUR, HX OF 10/02/2010  . 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 Chronic neck pain 12/13/2010  . GERD (gastroesophageal reflux disease)   . 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 Cancer of kidney 10/07/2012    Followed per Dr Mable Paris, MD,  urology, [facility] [place]    Past Surgical History  Procedure Laterality Date  . Left toe amputated  2006  . Bunionectomy  1980  . Goiter removal  1997  . Stress cardiolite  06/18/2006  . Tranthoracic echocardiogram  06/18/2006  . Electrocardiogram  05/29/2006  . Cholecystectomy    . Rotator cuff repair  Left    Dr. Lajoyce Corners  . Av fistula placement  03/13/2012    Procedure: ARTERIOVENOUS (AV) FISTULA CREATION;  Surgeon: Fransisco Hertz, MD;  [facility] OR;  Service: Vascular;  Laterality: Right;  . Eye surgery      bilateral cataract removal  . Esophagoscopy w/ botox injection  07/22/2012    Procedure: ESOPHAGOSCOPY WITH BOTOX INJECTION;  Surgeon: Drema Halon, MD;  Location: [facility];  Service: ENT;  Laterality: N/A;  esophageal dilation    reports that she has been smoking Cigarettes.  She has a 11.75 pack-year smoking history. She has never used smokeless tobacco. She reports that she does not drink alcohol or use illicit drugs. family history includes Coronary artery disease in her other; Dementia in her mother; Hyperlipidemia in her other; Hypertension in her other; Ovarian cancer in her other; and Stroke in her other. Allergies  Allergen Reactions  . Nsaids     REACTION: renal dysfunction  . Pioglitazone     Actos REACTION: EDEMA   Current Outpatient Prescriptions on File Prior to Visit  Medication  Sig Dispense Refill  . Acetaminophen (TYLENOL ARTHRITIS PAIN PO) Take by mouth.      Marland Kitchen albuterol (PROVENTIL HFA;VENTOLIN HFA) 108 (90 BASE) MCG/ACT inhaler Inhale 2 puffs into the lungs every 6 (six) hours as needed. For wheezing/SOB      . allopurinol (ZYLOPRIM) 100 MG tablet Take 100 mg by mouth daily.       Marland Kitchen aspirin EC 81 MG tablet Take 81 mg by mouth daily.      Marland Kitchen atorvastatin (LIPITOR) 40 MG tablet Take 1 tablet (40 mg total) by mouth daily.  30 tablet  11  . benzonatate (TESSALON PERLES) 100 MG capsule 1-2 tabs by mouth every 8 hrs as needed for cough  60  capsule  1  . calcium carbonate, dosed in mg elemental calcium, 1250 MG/5ML Take 5 mLs (500 mg of elemental calcium total) by mouth every 6 (six) hours as needed for heartburn.  250 mL  0  . clonazePAM (KLONOPIN) 2 MG tablet Take 1 tablet (2 mg total) by mouth 2 (two) times daily as needed. anxiety  60 tablet  1  . cyclobenzaprine (FLEXERIL) 10 MG tablet Take 10 mg by mouth 3 (three) times daily as needed. For muscle spasms      . diphenhydrAMINE (BENADRYL) 25 mg capsule Take 25 mg by mouth every 6 (six) hours as needed. For itching      . docusate sodium (COLACE) 100 MG capsule Take 100 mg by mouth 2 (two) times daily as needed. For constipation      . DULoxetine (CYMBALTA) 60 MG capsule Take 60 mg by mouth daily.      . ferrous sulfate 325 (65 FE) MG tablet Take 325 mg by mouth 2 (two) times daily.      . fluticasone (FLONASE) 50 MCG/ACT nasal spray Place 2 sprays into the nose daily.        . furosemide (LASIX) 80 MG tablet Take 160 mg by mouth daily.      Marland Kitchen HYDROcodone-acetaminophen (NORCO/VICODIN) 5-325 MG per tablet TAKE ONE TABLET BY MOUTH 4 TIMES DAILY AS NEEDED FOR PAIN  120 tablet  1  . hydrOXYzine (ATARAX/VISTARIL) 25 MG tablet Take 25 mg by mouth every 6 (six) hours as needed. For nerves      . levothyroxine (SYNTHROID, LEVOTHROID) 112 MCG tablet       . mometasone-formoterol (DULERA) 100-5 MCG/ACT AERO Inhale 2 puffs into the lungs 2 (two) times daily.  1 Inhaler  6  . omeprazole (PRILOSEC) 40 MG capsule Take 40 mg by mouth daily.       Marland Kitchen oxyCODONE (OXY IR/ROXICODONE) 5 MG immediate release tablet       . pregabalin (LYRICA) 50 MG capsule Take 2 capsules (100 mg total) by mouth at bedtime.  90 capsule  3  . promethazine (PHENERGAN) 25 MG tablet Take 25 mg by mouth every 8 (eight) hours as needed. For nausea/vomiting      . sodium bicarbonate 650 MG tablet Take 650 mg by mouth daily.      . traZODone (DESYREL) 100 MG tablet Take 1 tablet (100 mg total) by mouth at bedtime as needed.  For sleep  90 tablet  1  . amLODipine (NORVASC) 5 MG tablet Take 1 tablet (5 mg total) by mouth daily.  90 tablet  3  . montelukast (SINGULAIR) 10 MG tablet Take 1 tablet (10 mg total) by mouth daily.  30 tablet  11   No current facility-administered medications on file prior to visit.  Review of Systems  Constitutional: Negative for unexpected weight change, or unusual diaphoresis  HENT: Negative for tinnitus.   Eyes: Negative for photophobia and visual disturbance.  Respiratory: Negative for choking and stridor.   Gastrointestinal: Negative for vomiting and blood in stool.  Genitourinary: Negative for hematuria and decreased urine volume.  Musculoskeletal: Negative for acute joint swelling Skin: Negative for color change and wound.  Neurological: Negative for tremors and numbness other than noted  Psychiatric/Behavioral: Negative for decreased concentration or  hyperactivity.       Objective:   Physical Exam BP 138/72  Pulse 92  Temp(Src) 98.1 F (36.7 C) (Oral)  Wt 153 lb (69.4 kg)  BMI 23.96 kg/m2  SpO2 92% VS noted,  Constitutional: Pt appears well-developed and well-nourished.  HENT: Head: NCAT.  Right Ear: External ear normal.  Left Ear: External ear normal.  Eyes: Conjunctivae and EOM are normal. Pupils are equal, round, and reactive to light.  Neck: Normal range of motion. Neck supple.  Cardiovascular: Normal rate and regular rhythm.   Pulmonary/Chest: Effort normal and breath sounds normal.  Abd:  Soft, NT, non-distended, + BS except for mild right low mid suprapubic tender without guarding or rebound Neurological: Pt is alert. Not confused , motor intact Skin: Skin is warm. No erythema.  Psychiatric: Pt behavior is normal. Thought content normal.     Assessment & Plan:

## 2012-10-07 NOTE — Assessment & Plan Note (Signed)
Ok for seroquel restart,  to f/u any worsening symptoms or concerns., d/c clonopin

## 2012-10-07 NOTE — Assessment & Plan Note (Signed)
stable overall by history and exam, recent data reviewed with pt, and pt to continue medical treatment as before,  to f/u any worsening symptoms or concerns BP Readings from Last 3 Encounters:  10/07/12 138/72  07/23/12 142/84  07/23/12 142/84

## 2012-10-07 NOTE — Assessment & Plan Note (Signed)
stable overall by history and exam, recent data reviewed with pt, and pt to continue medical treatment as before,  to f/u any worsening symptoms or concerns Lab Results  Component Value Date   HGBA1C 6.5 10/07/2012

## 2012-10-07 NOTE — Telephone Encounter (Signed)
Rec'd from [employer] forward 1 page to Dr.John

## 2012-10-07 NOTE — Telephone Encounter (Signed)
Critical lab  Creatinine- 4.9 BUN- 80 GFR- 11.7 Also faxing report to md...lmb

## 2012-10-07 NOTE — Assessment & Plan Note (Signed)
?   Etiology, exam benign except for mild low mid/right suprapubic tender - ? UTI vs other - for UA and labs, consider CT with recent biopsy, but currently afeb, will follow

## 2012-10-20 ENCOUNTER — Ambulatory Visit: Payer: Medicare Other | Admitting: Pulmonary Disease

## 2012-11-14 ENCOUNTER — Encounter: Payer: Self-pay | Admitting: Gastroenterology

## 2012-11-20 ENCOUNTER — Encounter: Payer: Self-pay | Admitting: Pulmonary Disease

## 2012-11-20 ENCOUNTER — Ambulatory Visit (INDEPENDENT_AMBULATORY_CARE_PROVIDER_SITE_OTHER): Payer: Medicare Other | Admitting: Pulmonary Disease

## 2012-11-20 VITALS — BP 148/72 | HR 84 | Temp 98.2°F | Ht 67.5 in | Wt 153.4 lb

## 2012-11-20 DIAGNOSIS — J449 Chronic obstructive pulmonary disease, unspecified: Secondary | ICD-10-CM

## 2012-11-20 NOTE — Progress Notes (Signed)
  Subjective:    Patient ID: [name] [name], female    DOB: 1940/11/02, 72 y.o.   MRN: [id number]  HPI The patient comes in today for followup of her known chronic asthmatic bronchitis, secondary to ongoing smoking.  She has had PFTs in the past with no significant obstructive disease demonstrated.  She has been maintained on dulera in order to decrease her airway inflammation and reduce her exacerbation rate, and she tells me that she has not had a flareup since the last visit.  Unfortunately, she continues to smoke, but feels that her breathing is stable on her current maintenance regimen.   Review of Systems  Constitutional: Negative for fever and unexpected weight change.  HENT: Positive for congestion, rhinorrhea and postnasal drip. Negative for ear pain, nosebleeds, sore throat, sneezing, trouble swallowing, dental problem and sinus pressure.   Eyes: Negative for redness and itching.  Respiratory: Positive for cough, shortness of breath and wheezing. Negative for chest tightness.   Cardiovascular: Negative for palpitations and leg swelling.  Gastrointestinal: Negative for nausea and vomiting.       Acid reflux  Genitourinary: Negative for dysuria.  Musculoskeletal: Negative for joint swelling.  Skin: Negative for rash.  Neurological: Negative for headaches.  Hematological: Does not bruise/bleed easily.  Psychiatric/Behavioral: Negative for dysphoric mood. The patient is not nervous/anxious.        Objective:   Physical Exam Well-developed female in no acute distress Nose without purulence or discharge noted Neck without lymphadenopathy or thyromegaly Chest totally clear to auscultation, no wheezing noted Cardiac exam with regular rate and rhythm, 2/6 systolic murmur Lower extremities without significant edema, no cyanosis Alert and oriented, moves all 4 extremities.       Assessment & Plan:

## 2012-11-20 NOTE — Assessment & Plan Note (Signed)
The patient is doing very well from a pulmonary standpoint, with no recent acute exacerbations.  However, I have told her that smoking cessation is her best treatment, and that she would probably need no medication if she were to quit.  She is going to work on smoking cessation, and will see me back in a year if doing well.

## 2012-11-20 NOTE — Patient Instructions (Addendum)
Stop smoking.  This is your most important treatment.  The money you save from not smoking can be used to pay for the nicotine patch. Stay on dulera twice a day, but if you quit smoking more than likely won't need anymore. Good luck on your upcoming surgery. followup with me in one year.

## 2012-11-24 ENCOUNTER — Telehealth: Payer: Self-pay | Admitting: Internal Medicine

## 2012-11-24 MED ORDER — CLONAZEPAM 0.5 MG PO TABS: 0.5000 mg | ORAL_TABLET | Freq: Two times a day (BID) | ORAL | Status: DC | PRN

## 2012-11-24 NOTE — Telephone Encounter (Signed)
Done hardcopy to robin  

## 2012-11-24 NOTE — Telephone Encounter (Signed)
Called the patient inform rx hardcopy sent to [facility] per pt. request

## 2012-11-24 NOTE — Telephone Encounter (Signed)
[name] [name] is going to [facility] on April 23 for surgery and then to [facility] for rehab.  She wants a refill on Klonipin.  They told her to take something for nerves before the surgery.  She is out of Klonipin.  She is also out of Seroquel and Trazadone,but can't afford to buy them.

## 2012-12-16 ENCOUNTER — Non-Acute Institutional Stay (SKILLED_NURSING_FACILITY): Payer: Medicare Other | Admitting: Internal Medicine

## 2012-12-16 DIAGNOSIS — I150 Renovascular hypertension: Secondary | ICD-10-CM

## 2012-12-16 DIAGNOSIS — D62 Acute posthemorrhagic anemia: Secondary | ICD-10-CM

## 2012-12-16 DIAGNOSIS — C649 Malignant neoplasm of unspecified kidney, except renal pelvis: Secondary | ICD-10-CM

## 2012-12-16 DIAGNOSIS — C641 Malignant neoplasm of right kidney, except renal pelvis: Secondary | ICD-10-CM

## 2012-12-16 DIAGNOSIS — E039 Hypothyroidism, unspecified: Secondary | ICD-10-CM

## 2012-12-19 ENCOUNTER — Non-Acute Institutional Stay (SKILLED_NURSING_FACILITY): Payer: Medicare Other | Admitting: Adult Health

## 2012-12-19 DIAGNOSIS — M62838 Other muscle spasm: Secondary | ICD-10-CM

## 2012-12-19 DIAGNOSIS — G4700 Insomnia, unspecified: Secondary | ICD-10-CM

## 2012-12-19 DIAGNOSIS — K5900 Constipation, unspecified: Secondary | ICD-10-CM

## 2012-12-19 DIAGNOSIS — F329 Major depressive disorder, single episode, unspecified: Secondary | ICD-10-CM

## 2012-12-19 DIAGNOSIS — M109 Gout, unspecified: Secondary | ICD-10-CM

## 2012-12-19 DIAGNOSIS — E785 Hyperlipidemia, unspecified: Secondary | ICD-10-CM

## 2012-12-19 DIAGNOSIS — K279 Peptic ulcer, site unspecified, unspecified as acute or chronic, without hemorrhage or perforation: Secondary | ICD-10-CM

## 2012-12-19 DIAGNOSIS — E039 Hypothyroidism, unspecified: Secondary | ICD-10-CM

## 2012-12-19 DIAGNOSIS — I10 Essential (primary) hypertension: Secondary | ICD-10-CM

## 2012-12-19 DIAGNOSIS — J449 Chronic obstructive pulmonary disease, unspecified: Secondary | ICD-10-CM

## 2012-12-19 DIAGNOSIS — N186 End stage renal disease: Secondary | ICD-10-CM

## 2012-12-19 DIAGNOSIS — F3289 Other specified depressive episodes: Secondary | ICD-10-CM

## 2012-12-27 ENCOUNTER — Other Ambulatory Visit: Payer: Self-pay | Admitting: Internal Medicine

## 2012-12-31 ENCOUNTER — Telehealth: Payer: Self-pay

## 2012-12-31 DIAGNOSIS — N189 Chronic kidney disease, unspecified: Secondary | ICD-10-CM

## 2012-12-31 DIAGNOSIS — E1129 Type 2 diabetes mellitus with other diabetic kidney complication: Secondary | ICD-10-CM

## 2012-12-31 DIAGNOSIS — C649 Malignant neoplasm of unspecified kidney, except renal pelvis: Secondary | ICD-10-CM

## 2012-12-31 DIAGNOSIS — Z483 Aftercare following surgery for neoplasm: Secondary | ICD-10-CM

## 2012-12-31 NOTE — Telephone Encounter (Signed)
Patient informed of MD instructions. 

## 2012-12-31 NOTE — Telephone Encounter (Signed)
This most likely will improve as she gets more time at home, I would recommend ensure up to three times per day between meals

## 2012-12-31 NOTE — Telephone Encounter (Signed)
The patient just got out of a nursing home.  She states she has no appetite and would like to know if what to do or take to help increase her appetite

## 2013-01-02 ENCOUNTER — Encounter: Payer: Self-pay | Admitting: Adult Health

## 2013-01-02 DIAGNOSIS — J449 Chronic obstructive pulmonary disease, unspecified: Secondary | ICD-10-CM | POA: Insufficient documentation

## 2013-01-02 DIAGNOSIS — M62838 Other muscle spasm: Secondary | ICD-10-CM | POA: Insufficient documentation

## 2013-01-02 DIAGNOSIS — K5900 Constipation, unspecified: Secondary | ICD-10-CM | POA: Insufficient documentation

## 2013-01-02 DIAGNOSIS — N189 Chronic kidney disease, unspecified: Secondary | ICD-10-CM | POA: Insufficient documentation

## 2013-01-02 NOTE — Progress Notes (Signed)
  Subjective:    Patient ID: [name] [name], female    DOB: 1941/01/10, 72 y.o.   MRN: [id number]  HPI This is a 72 year old female who is for discharge home with Home health PT, Nursing, CNA and Social worker. DME: Rollator due to unsteady gait. She has been admitted to [facility] on 12/02/12 from [facility] with discharge diagnosis of renal carcinoma. She has active problems of CKD stage IV, hypertension, renal failure and ESRD. She has been admitted for a short-term rehabilitation. She is currently having hemodialysis 3X/week   Review of Systems  Constitutional: Negative.   HENT: Negative.   Eyes: Negative.   Respiratory: Negative for shortness of breath and wheezing.   Cardiovascular: Positive for leg swelling. Negative for chest pain.  Gastrointestinal: Negative for abdominal pain and abdominal distention.  Endocrine: Negative.   Genitourinary: Negative.   Neurological: Negative.   Hematological: Negative for adenopathy. Does not bruise/bleed easily.  Psychiatric/Behavioral: Negative.        Objective:   Physical Exam  Nursing note and vitals reviewed. Constitutional: She is oriented to person, place, and time. She appears well-developed and well-nourished.  Cardiovascular: Normal rate and regular rhythm.   Pulmonary/Chest: Effort normal and breath sounds normal. No respiratory distress.  Abdominal: Soft. Bowel sounds are normal. She exhibits no distension.  Musculoskeletal: Normal range of motion. She exhibits edema. She exhibits no tenderness.  BLE edema, 2+ Has right arm AV Fistula + bruit  Neurological: She is alert and oriented to person, place, and time.  Skin: Skin is warm and dry.  Psychiatric: She has a normal mood and affect. Her behavior is normal. Judgment and thought content normal.   LABS: 12/17/12  hgb 8.2 12/16/12  T4 4.2  T3 uptake 44.4  Free thyroxine index 1.9 12/15/12  ths 15.030  Prealbumin 19.3 12/10/12  Wbc 10.0  hgb 7.0  hct 21.0  12/03/12  Wbc 8.5  hgb 7.7   hct 23.3  NA 139  K 4.0  Glucose 101  BUN 29  Creatinine 4.35  Medications reviewed per [facility]     Assessment & Plan:   Unspecified constipation  - no complaints of; continue Lactulose 30 cc PO Q D, Senna-S 2 tabs PO Q D and Colace 100 mg PO BID  Muscle spasm - stable; continue Flexeril 10 mg PO Q D  COPD (chronic obstructive pulmonary disease) - stable; continue Dulera 100-5 mcg 2 puffs BID  Cancer of kidney S/ partial nephrectomy - stable  Insomnia - stable; continue Trazodone 100 mg PO Q HS  End stage renal disease - continue hemodialysis 3X/week  Gait instability - for Home health follow-up  GOUT - stable; continue allopurinol 100 mg PO Q D  PEPTIC ULCER DISEASE - stable; continue Prilosec 40 mg PO Q D  HYPOTHYROIDISM - stable; continue Synthroid 125 mcg PO Q D  HYPERLIPIDEMIA - continue Lipitor 40 mg PO Q D  DEPRESSION - stable; continue Seroquel 100 mg PO Q D  HYPERTENSION - stable

## 2013-01-05 ENCOUNTER — Telehealth: Payer: Self-pay | Admitting: *Deleted

## 2013-01-05 MED ORDER — CYPROHEPTADINE HCL 4 MG PO TABS: 4.0000 mg | ORAL_TABLET | Freq: Two times a day (BID) | ORAL | Status: DC

## 2013-01-05 NOTE — Telephone Encounter (Signed)
At the risk of polypharmacy, ok for periactin bid - done erx

## 2013-01-05 NOTE — Telephone Encounter (Signed)
Called the patient and mail box was full. 

## 2013-01-05 NOTE — Telephone Encounter (Signed)
Called the pt. Mailbox was full will call back

## 2013-01-05 NOTE — Telephone Encounter (Signed)
Pt is requesting prescription for appetite stimulation. Per brother in [place] who is a physician, pt is requesting an rx for either Pamelor or Periactin.

## 2013-01-05 NOTE — Telephone Encounter (Signed)
[name] to let us know they missed seeing Ms [name] [name] today because she had an appt with a doctor for gout.

## 2013-01-06 DIAGNOSIS — I150 Renovascular hypertension: Secondary | ICD-10-CM | POA: Insufficient documentation

## 2013-01-06 NOTE — Telephone Encounter (Signed)
Patient informed. 

## 2013-01-06 NOTE — Progress Notes (Signed)
Patient ID: [name] [name], female   DOB: 08/21/1940, 72 y.o.   MRN: [id number]        HISTORY & PHYSICAL  DATE: 12/16/2012   FACILITY: [facility]  LEVEL OF CARE: SNF (31)  ALLERGIES:  Allergies  Allergen Reactions  . Nsaids     REACTION: renal dysfunction  . Pioglitazone     Actos REACTION: EDEMA    CHIEF COMPLAINT:  Manage renal cell carcinoma, renovascular hypertension, and hypothyroidism.    HISTORY OF PRESENT ILLNESS:  The patient is a 72 year-old, African-American female.    RENAL CELL CARCINOMA:  The patient was diagnosed with right renal cell carcinoma.  She underwent partial nephrectomy and tolerated the procedure well.  She is admitted to this facility for short-term rehabilitation.  She denies any acute pain.    HYPOTHYROIDISM: The hypothyroidism remains stable. No complications noted from the medications presently being used.  The patient denies fatigue or constipation.  Last TSH:  A recent TSH is not available.    HTN: Pt 's HTN remains stable.  Denies CP, sob, DOE, pedal edema, headaches, dizziness or visual disturbances.  No complications from the medications currently being used.  Last BP : 135/53.  PAST MEDICAL HISTORY :  Past Medical History  Diagnosis Date  . HYPOTHYROIDISM 02/17/2007    s/p surgical removal of goiter in 1997  . DIABETES MELLITUS, TYPE II 02/17/2007  . HYPERLIPIDEMIA 02/17/2007  . GOUT 05/29/2007  . ANEMIA-NOS 05/29/2007  . ANXIETY 03/23/2010  . 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 DEPRESSION 02/17/2007  . RESTLESS LEG SYNDROME 05/29/2007  . COMMON MIGRAINE 05/29/2007  . PERIPHERAL NEUROPATHY 05/29/2007  . HYPERTENSION 02/17/2007  . PERIPHERAL VASCULAR DISEASE 02/17/2007  . PEPTIC ULCER DISEASE 05/29/2007  . Chronic kidney disease 02/17/2007    Followed by Dr. Lowell Guitar  . OSTEOPENIA 09/22/2009  . SEIZURE DISORDER 02/17/2007  . Memory loss 01/24/2010  . Personal history of colonic polyps 11/16/2009  . Palpitations 09/08/2010  . Other  diseases of vocal cords 1996  . HEART MURMUR, HX OF 10/02/2010  . 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 Chronic neck pain 12/13/2010  . GERD (gastroesophageal reflux disease)   . 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 Cancer of kidney 10/07/2012    Followed per Dr Mable Paris, MD, urology, [facility] [place]    COPD.   PAST SURGICAL HISTORY: Past Surgical History  Procedure Laterality Date  . Left toe amputated  2006  . Bunionectomy  1980  . Goiter removal  1997  . Stress cardiolite  06/18/2006  . Tranthoracic echocardiogram  06/18/2006  . Electrocardiogram  05/29/2006  . Cholecystectomy    . Rotator cuff repair  Left    Dr. Lajoyce Corners  . Av fistula placement  03/13/2012    Procedure: ARTERIOVENOUS (AV) FISTULA CREATION;  Surgeon: Fransisco Hertz, MD;  Location: [facility] OR;  Service: Vascular;  Laterality: Right;  . Eye surgery      bilateral cataract removal  . Esophagoscopy w/ botox injection  07/22/2012    Procedure: ESOPHAGOSCOPY WITH BOTOX INJECTION;  Surgeon: Drema Halon, MD;  Location: [facility];  Service: ENT;  Laterality: N/A;  esophageal dilation   Abdominal surgery.  SOCIAL HISTORY:  reports that she has been smoking Cigarettes.  She has a 11.75 pack-year smoking history. She has never used smokeless tobacco. She reports that she does not drink alcohol or use illicit drugs.  FAMILY  HISTORY:  Family History  Problem Relation Age of Onset  . Dementia Mother   . Coronary artery disease Other   . Hyperlipidemia Other   . Hypertension Other   . Ovarian cancer Other   . Stroke Other     CURRENT MEDICATIONS: Reviewed per [facility]  REVIEW OF SYSTEMS:   GI:  Complains of constipation.    See HPI otherwise 14 point ROS is negative.  PHYSICAL EXAMINATION          VS:  T 95.3       P 87       RR 18      BP 135/53      POX 98% room air       WT (Lb)  GENERAL: no acute distress, normal body habitus EYES: conjunctivae normal,  sclerae normal, normal eye lids MOUTH/THROAT: lips without lesions,no lesions in the mouth,tongue is without lesions,uvula elevates in midline NECK: supple, trachea midline, no neck masses, no thyroid tenderness, no thyromegaly LYMPHATICS: no LAN in the neck, no supraclavicular LAN RESPIRATORY: breathing is even & unlabored, BS CTAB CARDIAC: RRR, no murmur,no extra heart sounds, no edema GI:  ABDOMEN: abdomen soft, normal BS, no masses, no tenderness  LIVER/SPLEEN: no hepatomegaly, no splenomegaly MUSCULOSKELETAL: HEAD: normal to inspection & palpation BACK: no kyphosis, scoliosis or spinal processes tenderness EXTREMITIES: LEFT UPPER EXTREMITY: strength intact, range of motion moderate due to rotator cuff problem RIGHT UPPER EXTREMITY:  full range of motion, normal strength & tone LEFT LOWER EXTREMITY:  full range of motion, normal strength & tone RIGHT LOWER EXTREMITY:  full range of motion, normal strength & tone PSYCHIATRIC: the patient is alert & oriented to person, affect & behavior appropriate  LABS/RADIOLOGY: Hemoglobin 7.7, MCV 94, otherwise CBC normal.    Glucose 101, BUN 29, creatinine 4.35, total protein 5.2, albumin 2.9, otherwise CMP normal.    ASSESSMENT/PLAN:  Right renal cell carcinoma.  Status post partial nephrectomy.  Continue rehabilitation.   Renovascular hypertension.  Well controlled.   Hypothyroidism.  Check TSH.   Acute blood loss anemia.  Reassess hemoglobin level.  Constipation.  Uncontrolled problem.  Discontinue Colace.  Start MiraLAX 17 g q.d.   COPD 496.  Well compensated.  I have reviewed patient's medical records received at admission/from hospitalization.  CPT CODE: 21308

## 2013-01-08 ENCOUNTER — Ambulatory Visit: Payer: Medicare Other | Admitting: Internal Medicine

## 2013-01-14 ENCOUNTER — Telehealth: Payer: Self-pay

## 2013-01-14 NOTE — Telephone Encounter (Signed)
[facility] PT to inform the patient is getting therapy only once this week instead of twice.  The patient has a busy schedule and cannot fit in [facility].  MD to be informed.

## 2013-02-04 ENCOUNTER — Encounter: Payer: Self-pay | Admitting: Vascular Surgery

## 2013-02-04 ENCOUNTER — Ambulatory Visit (INDEPENDENT_AMBULATORY_CARE_PROVIDER_SITE_OTHER): Payer: Medicare Other | Admitting: Vascular Surgery

## 2013-02-04 ENCOUNTER — Encounter (HOSPITAL_COMMUNITY): Payer: Self-pay | Admitting: *Deleted

## 2013-02-04 ENCOUNTER — Other Ambulatory Visit: Payer: Self-pay

## 2013-02-04 ENCOUNTER — Ambulatory Visit: Payer: Medicare Other | Admitting: Vascular Surgery

## 2013-02-04 ENCOUNTER — Encounter (HOSPITAL_COMMUNITY): Payer: Self-pay | Admitting: Pharmacy Technician

## 2013-02-04 VITALS — BP 123/62 | HR 101 | Resp 16 | Ht 67.5 in | Wt 144.0 lb

## 2013-02-04 DIAGNOSIS — L988 Other specified disorders of the skin and subcutaneous tissue: Secondary | ICD-10-CM

## 2013-02-04 DIAGNOSIS — N186 End stage renal disease: Secondary | ICD-10-CM

## 2013-02-04 DIAGNOSIS — R234 Changes in skin texture: Secondary | ICD-10-CM | POA: Insufficient documentation

## 2013-02-04 NOTE — Progress Notes (Signed)
Pt denies SOB, chest pain, and being under the care of a cardiologist. Pt made aware to Stop taking Aspirin and herbal medications. Do not take any NSAIDs ie: Ibuprofen, Advil, Naproxen or any medication containing Aspirin. 

## 2013-02-04 NOTE — Progress Notes (Signed)
Vascular and Vein Specialist of [facility]  Patient name: [name] [name] MRN: [id number] DOB: 1941-04-30 Sex: female  REASON FOR VISIT: To evaluate rash over her right upper arm AV fistula.  HPI: [name] [name] is a 72 y.o. female who had a basilic vein transposition in the right upper arm. Her second stage was performed on 04/23/2012. She was seen by Dr. Imogene Burn on 05/30/2012 the fistula was working well. Yesterday, the renal PA called to say that they were having difficulty sticking her fistula and that there was skin breakdown over the fistula. He also complains of itching. The dialysis center noted that the skin overlying the fistula was then and they were afraid to stick the fistula. Her only complaint has been some generalized itching.  She dialyzes on Tuesdays Thursdays and Saturdays 11 AM.  REVIEW OF SYSTEMS: Arly.Keller ] denotes positive finding; [  ] denotes negative finding  CARDIOVASCULAR:  [ ]  chest pain   [ ]  dyspnea on exertion    CONSTITUTIONAL:  [ ]  fever   [ ]  chills  PHYSICAL EXAM: Filed Vitals:   02/04/13 1535  BP: 123/62  Pulse: 101  Resp: 16  Height: 5' 7.5" (1.715 m)  Weight: 144 lb (65.318 kg)  SpO2: 99%   Body mass index is 22.21 kg/(m^2). GENERAL: The patient is a well-nourished female, in no acute distress. The vital signs are documented above. CARDIOVASCULAR: There is a regular rate and rhythm  PULMONARY: There is good air exchange bilaterally without wheezing or rales. Her right upper arm fistula has an excellent thrill and bruit. She has dry skin overlying the fistula which is irritated. This may potentially be some type of allergic reaction to lotion that has been put on her arm.   MEDICAL ISSUES: I agree that the rash overlying the fistula this an issue with respect to cannulating her fistula. I have recommended placing a tunneled dialysis catheter and giving the fistula or rest until his rash has resolved. If she continues to have problems with this once it is no  longer being used for dialysis, she may need consultation with the dermatologist. Her tomorrow we'll plan on placing a catheter and trying to do this first thing in the morning so that she can get the dialysis at 11 AM. I have discussed the indications for placement of the catheter and the potential complications including but not limited to bleeding, arterial injury, and infection. All of her questions were answered she is agreeable to proceed.  [name] [name] S [facility] of [place] [facility]: [phone]

## 2013-02-05 ENCOUNTER — Encounter (HOSPITAL_COMMUNITY): Payer: Self-pay | Admitting: *Deleted

## 2013-02-05 ENCOUNTER — Telehealth: Payer: Self-pay | Admitting: Internal Medicine

## 2013-02-05 ENCOUNTER — Encounter (HOSPITAL_COMMUNITY)
Admission: RE | Disposition: A | Discharge status: Home / Self Care | Payer: Self-pay | Source: Ambulatory Visit | Attending: Vascular Surgery

## 2013-02-05 ENCOUNTER — Telehealth: Payer: Self-pay | Admitting: Vascular Surgery

## 2013-02-05 ENCOUNTER — Ambulatory Visit (HOSPITAL_COMMUNITY): Payer: Medicare Other | Admitting: Anesthesiology

## 2013-02-05 ENCOUNTER — Ambulatory Visit (HOSPITAL_COMMUNITY)
Admission: RE | Admit: 2013-02-05 | Discharge: 2013-02-05 | Disposition: A | Discharge status: Home / Self Care | Payer: Medicare Other | Source: Ambulatory Visit | Attending: Vascular Surgery | Admitting: Vascular Surgery

## 2013-02-05 ENCOUNTER — Ambulatory Visit (HOSPITAL_COMMUNITY): Payer: Medicare Other

## 2013-02-05 ENCOUNTER — Encounter (HOSPITAL_COMMUNITY): Payer: Self-pay | Admitting: Anesthesiology

## 2013-02-05 DIAGNOSIS — F3289 Other specified depressive episodes: Secondary | ICD-10-CM | POA: Insufficient documentation

## 2013-02-05 DIAGNOSIS — Z992 Dependence on renal dialysis: Secondary | ICD-10-CM | POA: Insufficient documentation

## 2013-02-05 DIAGNOSIS — J449 Chronic obstructive pulmonary disease, unspecified: Secondary | ICD-10-CM | POA: Insufficient documentation

## 2013-02-05 DIAGNOSIS — N186 End stage renal disease: Secondary | ICD-10-CM

## 2013-02-05 DIAGNOSIS — E119 Type 2 diabetes mellitus without complications: Secondary | ICD-10-CM | POA: Insufficient documentation

## 2013-02-05 DIAGNOSIS — F329 Major depressive disorder, single episode, unspecified: Secondary | ICD-10-CM | POA: Insufficient documentation

## 2013-02-05 DIAGNOSIS — F17200 Nicotine dependence, unspecified, uncomplicated: Secondary | ICD-10-CM | POA: Insufficient documentation

## 2013-02-05 DIAGNOSIS — R21 Rash and other nonspecific skin eruption: Secondary | ICD-10-CM | POA: Insufficient documentation

## 2013-02-05 DIAGNOSIS — T82898A Other specified complication of vascular prosthetic devices, implants and grafts, initial encounter: Secondary | ICD-10-CM | POA: Insufficient documentation

## 2013-02-05 DIAGNOSIS — G709 Myoneural disorder, unspecified: Secondary | ICD-10-CM | POA: Insufficient documentation

## 2013-02-05 DIAGNOSIS — I739 Peripheral vascular disease, unspecified: Secondary | ICD-10-CM | POA: Insufficient documentation

## 2013-02-05 DIAGNOSIS — E039 Hypothyroidism, unspecified: Secondary | ICD-10-CM | POA: Insufficient documentation

## 2013-02-05 DIAGNOSIS — K219 Gastro-esophageal reflux disease without esophagitis: Secondary | ICD-10-CM | POA: Insufficient documentation

## 2013-02-05 DIAGNOSIS — J4489 Other specified chronic obstructive pulmonary disease: Secondary | ICD-10-CM | POA: Insufficient documentation

## 2013-02-05 DIAGNOSIS — Y929 Unspecified place or not applicable: Secondary | ICD-10-CM | POA: Insufficient documentation

## 2013-02-05 DIAGNOSIS — F411 Generalized anxiety disorder: Secondary | ICD-10-CM | POA: Insufficient documentation

## 2013-02-05 DIAGNOSIS — Y832 Surgical operation with anastomosis, bypass or graft as the cause of abnormal reaction of the patient, or of later complication, without mention of misadventure at the time of the procedure: Secondary | ICD-10-CM | POA: Insufficient documentation

## 2013-02-05 HISTORY — DX: Unspecified osteoarthritis, unspecified site: M19.90

## 2013-02-05 HISTORY — PX: INSERTION OF DIALYSIS CATHETER: SHX1324

## 2013-02-05 LAB — POCT I-STAT 4, (NA,K, GLUC, HGB,HCT)
Glucose, Bld: 125 mg/dL — ABNORMAL HIGH (ref 70–99)
Hemoglobin: 14.3 g/dL (ref 12.0–15.0)

## 2013-02-05 SURGERY — INSERTION OF DIALYSIS CATHETER
Anesthesia: Monitor Anesthesia Care | Site: Neck | Wound class: Clean

## 2013-02-05 MED ORDER — OXYCODONE-ACETAMINOPHEN 5-325 MG PO TABS
ORAL_TABLET | ORAL | Status: AC
  Filled 2013-02-05: qty 2

## 2013-02-05 MED ORDER — LIDOCAINE-EPINEPHRINE (PF) 1 %-1:200000 IJ SOLN
INTRAMUSCULAR | Status: DC | PRN
  Administered 2013-02-05: 30 mL

## 2013-02-05 MED ORDER — OXYCODONE-ACETAMINOPHEN 5-325 MG PO TABS: 1.0000 | ORAL_TABLET | ORAL | Status: DC | PRN

## 2013-02-05 MED ORDER — OXYCODONE-ACETAMINOPHEN 5-325 MG PO TABS
1.0000 | ORAL_TABLET | Freq: Once | ORAL | Status: AC | PRN
  Administered 2013-02-05: 2 via ORAL

## 2013-02-05 MED ORDER — FENTANYL CITRATE 0.05 MG/ML IJ SOLN
INTRAMUSCULAR | Status: DC | PRN
  Administered 2013-02-05: 50 ug via INTRAVENOUS
  Administered 2013-02-05: 100 ug via INTRAVENOUS

## 2013-02-05 MED ORDER — SODIUM CHLORIDE 0.9 % IV SOLN
INTRAVENOUS | Status: DC | PRN
  Administered 2013-02-05: 07:00:00 via INTRAVENOUS

## 2013-02-05 MED ORDER — MIDAZOLAM HCL 5 MG/5ML IJ SOLN
INTRAMUSCULAR | Status: DC | PRN
  Administered 2013-02-05 (×2): 1 mg via INTRAVENOUS

## 2013-02-05 MED ORDER — DIPHENHYDRAMINE HCL 50 MG/ML IJ SOLN
12.5000 mg | Freq: Once | INTRAMUSCULAR | Status: AC
  Administered 2013-02-05: 12.5 mg via INTRAVENOUS

## 2013-02-05 MED ORDER — DIPHENHYDRAMINE HCL 50 MG/ML IJ SOLN
INTRAMUSCULAR | Status: AC
  Filled 2013-02-05: qty 1

## 2013-02-05 MED ORDER — SODIUM CHLORIDE 0.9 % IV SOLN: INTRAVENOUS | Status: DC

## 2013-02-05 MED ORDER — 0.9 % SODIUM CHLORIDE (POUR BTL) OPTIME
TOPICAL | Status: DC | PRN
  Administered 2013-02-05: 1000 mL

## 2013-02-05 MED ORDER — HEPARIN SODIUM (PORCINE) 1000 UNIT/ML IJ SOLN
INTRAMUSCULAR | Status: AC
  Filled 2013-02-05: qty 1

## 2013-02-05 MED ORDER — SODIUM CHLORIDE 0.9 % IR SOLN
Status: DC | PRN
  Administered 2013-02-05: 08:00:00

## 2013-02-05 MED ORDER — LIDOCAINE-EPINEPHRINE (PF) 1 %-1:200000 IJ SOLN
INTRAMUSCULAR | Status: AC
  Filled 2013-02-05: qty 10

## 2013-02-05 MED ORDER — HEPARIN SODIUM (PORCINE) 1000 UNIT/ML IJ SOLN
INTRAMUSCULAR | Status: DC | PRN
  Administered 2013-02-05: 10 mL

## 2013-02-05 MED ORDER — PROPOFOL 10 MG/ML IV BOLUS
INTRAVENOUS | Status: DC | PRN
  Administered 2013-02-05: 20 mg via INTRAVENOUS
  Administered 2013-02-05: 30 mg via INTRAVENOUS

## 2013-02-05 MED ORDER — LIDOCAINE HCL (CARDIAC) 20 MG/ML IV SOLN
INTRAVENOUS | Status: DC | PRN
  Administered 2013-02-05: 50 mg via INTRAVENOUS

## 2013-02-05 MED ORDER — CEFUROXIME SODIUM 1.5 G IJ SOLR
1.5000 g | INTRAMUSCULAR | Status: AC
  Administered 2013-02-05: 1.5 g via INTRAVENOUS
  Filled 2013-02-05: qty 1.5

## 2013-02-05 SURGICAL SUPPLY — 47 items
BAG BANDED W/RUBBER/TAPE 36X54 (MISCELLANEOUS) ×1 IMPLANT
BAG DECANTER FOR FLEXI CONT (MISCELLANEOUS) ×2 IMPLANT
BAG EQP BAND 135X91 W/RBR TAPE (MISCELLANEOUS) ×1
CATH CANNON HEMO 15F 50CM (CATHETERS) IMPLANT
CATH CANNON HEMO 15FR 19 (HEMODIALYSIS SUPPLIES) IMPLANT
CATH CANNON HEMO 15FR 23CM (HEMODIALYSIS SUPPLIES) ×1 IMPLANT
CATH CANNON HEMO 15FR 31CM (HEMODIALYSIS SUPPLIES) IMPLANT
CATH CANNON HEMO 15FR 32 (HEMODIALYSIS SUPPLIES) IMPLANT
CATH CANNON HEMO 15FR 32CM (HEMODIALYSIS SUPPLIES) IMPLANT
CHLORAPREP W/TINT 26ML (MISCELLANEOUS) ×2 IMPLANT
CLOTH BEACON ORANGE TIMEOUT ST (SAFETY) ×2 IMPLANT
COVER DOME SNAP 22 D (MISCELLANEOUS) ×1 IMPLANT
COVER PROBE W GEL 5X96 (DRAPES) ×1 IMPLANT
COVER SURGICAL LIGHT HANDLE (MISCELLANEOUS) ×2 IMPLANT
DRAPE C-ARM 42X72 X-RAY (DRAPES) ×1 IMPLANT
DRAPE CHEST BREAST 15X10 FENES (DRAPES) ×2 IMPLANT
GAUZE SPONGE 2X2 8PLY STRL LF (GAUZE/BANDAGES/DRESSINGS) ×1 IMPLANT
GAUZE SPONGE 4X4 16PLY XRAY LF (GAUZE/BANDAGES/DRESSINGS) ×2 IMPLANT
GLOVE BIO SURGEON STRL SZ 6.5 (GLOVE) ×1 IMPLANT
GLOVE BIO SURGEON STRL SZ7.5 (GLOVE) ×2 IMPLANT
GLOVE BIOGEL PI IND STRL 7.0 (GLOVE) IMPLANT
GLOVE BIOGEL PI IND STRL 8 (GLOVE) ×1 IMPLANT
GLOVE BIOGEL PI INDICATOR 7.0 (GLOVE) ×1
GLOVE BIOGEL PI INDICATOR 8 (GLOVE) ×1
GOWN STRL NON-REIN LRG LVL3 (GOWN DISPOSABLE) ×4 IMPLANT
KIT BASIN OR (CUSTOM PROCEDURE TRAY) ×2 IMPLANT
KIT ROOM TURNOVER OR (KITS) ×2 IMPLANT
NDL 18GX1X1/2 (RX/OR ONLY) (NEEDLE) ×1 IMPLANT
NDL HYPO 25GX1X1/2 BEV (NEEDLE) ×1 IMPLANT
NEEDLE 18GX1X1/2 (RX/OR ONLY) (NEEDLE) ×2 IMPLANT
NEEDLE 22X1 1/2 (OR ONLY) (NEEDLE) ×2 IMPLANT
NEEDLE HYPO 25GX1X1/2 BEV (NEEDLE) ×2 IMPLANT
NS IRRIG 1000ML POUR BTL (IV SOLUTION) ×2 IMPLANT
PACK SURGICAL SETUP 50X90 (CUSTOM PROCEDURE TRAY) ×2 IMPLANT
PAD ARMBOARD 7.5X6 YLW CONV (MISCELLANEOUS) ×4 IMPLANT
SPONGE GAUZE 2X2 STER 10/PKG (GAUZE/BANDAGES/DRESSINGS) ×1
SUT ETHILON 3 0 PS 1 (SUTURE) ×2 IMPLANT
SUT VICRYL 4-0 PS2 18IN ABS (SUTURE) ×2 IMPLANT
SYR 20CC LL (SYRINGE) ×4 IMPLANT
SYR 30ML LL (SYRINGE) IMPLANT
SYR 5ML LL (SYRINGE) ×4 IMPLANT
SYR CONTROL 10ML LL (SYRINGE) ×2 IMPLANT
SYRINGE 10CC LL (SYRINGE) ×2 IMPLANT
TAPE CLOTH SURG 4X10 WHT LF (GAUZE/BANDAGES/DRESSINGS) ×1 IMPLANT
TOWEL OR 17X24 6PK STRL BLUE (TOWEL DISPOSABLE) ×2 IMPLANT
TOWEL OR 17X26 10 PK STRL BLUE (TOWEL DISPOSABLE) ×2 IMPLANT
WATER STERILE IRR 1000ML POUR (IV SOLUTION) ×1 IMPLANT

## 2013-02-05 NOTE — Transfer of Care (Signed)
Immediate Anesthesia Transfer of Care Note  Patient: [name] [name]  Procedure(s) Performed: Procedure(s) with comments: INSERTION OF DIALYSIS CATHETER (N/A) - Ultrasound guided  Patient Location: PACU  Anesthesia Type:MAC  Level of Consciousness: awake, alert  and oriented  Airway & Oxygen Therapy: Patient Spontanous Breathing  Post-op Assessment: Report given to PACU RN and Post -op Vital signs reviewed and stable  Post vital signs: Reviewed and stable  Complications: No apparent anesthesia complications

## 2013-02-05 NOTE — Anesthesia Postprocedure Evaluation (Signed)
  Anesthesia Post-op Note  Patient: [name] [name]  Procedure(s) Performed: Procedure(s) with comments: INSERTION OF DIALYSIS CATHETER (N/A) - Ultrasound guided  Patient Location: PACU  Anesthesia Type:MAC  Level of Consciousness: awake, alert  and oriented  Airway and Oxygen Therapy: Patient Spontanous Breathing  Post-op Pain: none  Post-op Assessment: Post-op Vital signs reviewed, Patient's Cardiovascular Status Stable, Respiratory Function Stable and Patent Airway  Post-op Vital Signs: stable  Complications: No apparent anesthesia complications

## 2013-02-05 NOTE — Interval H&P Note (Signed)
History and Physical Interval Note:  02/05/2013 7:34 AM  [name] [name]  has presented today for surgery, with the diagnosis of End Stage Renal Disease  The various methods of treatment have been discussed with the patient and family. After consideration of risks, benefits and other options for treatment, the patient has consented to  Procedure(s) with comments: INSERTION OF DIALYSIS CATHETER (N/A) - Ultrasound guided as a surgical intervention .  The patient's history has been reviewed, patient examined, no change in status, stable for surgery.  I have reviewed the patient's chart and labs.  Questions were answered to the patient's satisfaction.     Naftoli Penny S

## 2013-02-05 NOTE — Telephone Encounter (Signed)
Ms Golob called to see if we had a PA on one of her medicines because when she called the pharmacy it was not there.  She could not tell me the name of the medicine.  Zella Ball told me she does not have any PA's on anything.  I tried to call her back multiple times to get more information with no answer.  I LMOM for her to call us.

## 2013-02-05 NOTE — H&P (View-Only) (Signed)
Vascular and Vein Specialist of [facility]  Patient name: [name] [name] MRN: [id number] DOB: 06/04/1941 Sex: female  REASON FOR VISIT: To evaluate rash over her right upper arm AV fistula.  HPI: [name] [name] is a 72 y.o. female who had a basilic vein transposition in the right upper arm. Her second stage was performed on 04/23/2012. She was seen by Dr. Chen on 05/30/2012 the fistula was working well. Yesterday, the renal PA called to say that they were having difficulty sticking her fistula and that there was skin breakdown over the fistula. He also complains of itching. The dialysis center noted that the skin overlying the fistula was then and they were afraid to stick the fistula. Her only complaint has been some generalized itching.  She dialyzes on Tuesdays Thursdays and Saturdays 11 AM.  REVIEW OF SYSTEMS: [X ] denotes positive finding; [  ] denotes negative finding  CARDIOVASCULAR:  [ ] chest pain   [ ] dyspnea on exertion    CONSTITUTIONAL:  [ ] fever   [ ] chills  PHYSICAL EXAM: Filed Vitals:   02/04/13 1535  BP: 123/62  Pulse: 101  Resp: 16  Height: 5' 7.5" (1.715 m)  Weight: 144 lb (65.318 kg)  SpO2: 99%   Body mass index is 22.21 kg/(m^2). GENERAL: The patient is a well-nourished female, in no acute distress. The vital signs are documented above. CARDIOVASCULAR: There is a regular rate and rhythm  PULMONARY: There is good air exchange bilaterally without wheezing or rales. Her right upper arm fistula has an excellent thrill and bruit. She has dry skin overlying the fistula which is irritated. This may potentially be some type of allergic reaction to lotion that has been put on her arm.   MEDICAL ISSUES: I agree that the rash overlying the fistula this an issue with respect to cannulating her fistula. I have recommended placing a tunneled dialysis catheter and giving the fistula or rest until his rash has resolved. If she continues to have problems with this once it is no  longer being used for dialysis, she may need consultation with the dermatologist. Her tomorrow we'll plan on placing a catheter and trying to do this first thing in the morning so that she can get the dialysis at 11 AM. I have discussed the indications for placement of the catheter and the potential complications including but not limited to bleeding, arterial injury, and infection. All of her questions were answered she is agreeable to proceed.  Clearance Chenault S [facility] [place] [facility]: [phone]     

## 2013-02-05 NOTE — Telephone Encounter (Signed)
Will close note at this time.

## 2013-02-05 NOTE — Telephone Encounter (Addendum)
Message copied by Rosalyn Charters on Thu Feb 05, 2013 10:08 AM ------      Message from: Lamar Blinks S      Created: Thu Feb 05, 2013  9:18 AM      Regarding: FW: charge                   ----- Message -----         From: Chuck Hint, MD         Sent: 02/05/2013   8:30 AM           To: Reuel Derby, Melene Plan, RN      Subject: charge                                                   This patient had ultrasound-guided placement of a right IJ tunneled dialysis catheter. I probably need to see her in approximately 3-4 weeks to check on her right basilic vein transposition. Thank you.CD ------  notified patient of fu appt. on 02-25-13 at 9:30 with dr. Ferne Coe

## 2013-02-05 NOTE — Anesthesia Preprocedure Evaluation (Addendum)
Anesthesia Evaluation  Patient identified by MRN, date of birth, ID band Patient awake    Reviewed: Allergy & Precautions, H&P , NPO status , Patient's Chart, lab work & pertinent test results, reviewed documented beta blocker date and time   History of Anesthesia Complications (+) DIFFICULT IV STICK / SPECIAL LINE  Airway Mallampati: I      Dental  (+) Edentulous Upper, Edentulous Lower and Dental Advisory Given   Pulmonary asthma , COPD COPD inhaler, Current Smoker,  breath sounds clear to auscultation        Cardiovascular hypertension, Pt. on medications + Peripheral Vascular Disease Rhythm:Regular Rate:Normal     Neuro/Psych  Headaches, PSYCHIATRIC DISORDERS Anxiety Depression  Neuromuscular disease    GI/Hepatic PUD, GERD-  ,  Endo/Other  neg diabetesHypothyroidism   Renal/GU CRF and DialysisRenal disease     Musculoskeletal   Abdominal   Peds  Hematology   Anesthesia Other Findings   Reproductive/Obstetrics                          Anesthesia Physical Anesthesia Plan  ASA: III  Anesthesia Plan: MAC   Post-op Pain Management:    Induction: Intravenous  Airway Management Planned: Nasal Cannula  Additional Equipment:   Intra-op Plan:   Post-operative Plan:   Informed Consent: I have reviewed the patients History and Physical, chart, labs and discussed the procedure including the risks, benefits and alternatives for the proposed anesthesia with the patient or authorized representative who has indicated his/her understanding and acceptance.   Dental advisory given  Plan Discussed with: Anesthesiologist and Surgeon  Anesthesia Plan Comments:         Anesthesia Quick Evaluation

## 2013-02-05 NOTE — Preoperative (Signed)
Beta Blockers   Reason not to administer Beta Blockers:Not Applicable. No home beta blockers 

## 2013-02-05 NOTE — Progress Notes (Signed)
Copy of CXR faxed to [facility]

## 2013-02-05 NOTE — Op Note (Signed)
02/05/2013  PREOP DIAGNOSIS: Chronic kidney disease  POSTOP DIAGNOSIS: Chronic kidney disease  PROCEDURE: Ultrasound guided placement of right IJ Diatek catheter  SURGEON: Di Kindle. Edilia Bo, MD, FACS  ASSIST: none  ANESTHESIA: local with sedation   EBL: minimal  FINDINGS: patent right IJ  INDICATIONS: This patient developed a rash over her right basilic vein transposition. It was felt that until this healed dialysis should not  Access her fistula. Therefore I recommended placement of a tunneled dialysis catheter for now.  TECHNIQUE: The patient was taken to the operating room and sedated by anesthesia. The neck and upper chest were prepped and draped in the usual sterile fashion. After the skin was anesthetized with 1% lidocaine, and under ultrasound guidance, the right IJ was cannulated and a guidewire introduced into the superior vena cava under fluoroscopic control. The tract over the wire was dilated and then the dilator and peel-away sheath were passed over the wire and the wire and dilator removed. The catheter was passed through the peel-away sheath and positioned in the right atrium. The exit site for the catheter was selected and the skin anesthetized between the 2 areas. The catheter was then brought through the tunnel, cut to the appropriate length, and the distal ports were attached. Both ports withdrew easily, were then flushed with heparinized saline and filled with concentrated heparin. The catheter was secured at its exit site with a 3-0 nylon suture. The IJ cannulation site was closed with a 4-0 subcuticular stitch. A sterile dressing was applied. The patient tolerated the procedure well and was transferred to the recovery room in stable condition. All needle and sponge counts were correct.  Waverly Ferrari, MD, FACS [facility] of [place]  DATE OF OPERATION: 02/05/2013 DATE OF DICTATION: 02/05/2013

## 2013-02-05 NOTE — Progress Notes (Signed)
Report called to Thayer Ohm, RN [facility]).

## 2013-02-09 ENCOUNTER — Encounter (HOSPITAL_COMMUNITY): Payer: Self-pay | Admitting: Vascular Surgery

## 2013-02-09 ENCOUNTER — Ambulatory Visit (INDEPENDENT_AMBULATORY_CARE_PROVIDER_SITE_OTHER): Payer: Medicare Other | Admitting: Internal Medicine

## 2013-02-09 VITALS — BP 110/70 | HR 108 | Temp 99.2°F | Ht 67.0 in | Wt 144.2 lb

## 2013-02-09 DIAGNOSIS — R7309 Other abnormal glucose: Secondary | ICD-10-CM

## 2013-02-09 DIAGNOSIS — I10 Essential (primary) hypertension: Secondary | ICD-10-CM

## 2013-02-09 DIAGNOSIS — J449 Chronic obstructive pulmonary disease, unspecified: Secondary | ICD-10-CM

## 2013-02-09 DIAGNOSIS — R7302 Impaired glucose tolerance (oral): Secondary | ICD-10-CM

## 2013-02-09 DIAGNOSIS — L509 Urticaria, unspecified: Secondary | ICD-10-CM

## 2013-02-09 MED ORDER — TRIAMCINOLONE ACETONIDE 0.1 % EX CREA: TOPICAL_CREAM | Freq: Two times a day (BID) | CUTANEOUS | Status: DC

## 2013-02-09 MED ORDER — METHYLPREDNISOLONE ACETATE 80 MG/ML IJ SUSP
80.0000 mg | Freq: Once | INTRAMUSCULAR | Status: AC
  Administered 2013-02-09: 80 mg via INTRAMUSCULAR

## 2013-02-09 MED ORDER — PREDNISONE 10 MG PO TABS: ORAL_TABLET | ORAL | Status: DC

## 2013-02-09 NOTE — Assessment & Plan Note (Signed)
stable overall by history and exam, recent data reviewed with pt, and pt to continue medical treatment as before,  to f/u any worsening symptoms or concerns BP Readings from Last 3 Encounters:  02/09/13 110/70  02/05/13 141/80  02/05/13 141/80

## 2013-02-09 NOTE — Patient Instructions (Signed)
You had the steroid shot today Please take all new medication as prescribed  - the prednisone Please continue all other medications as before, and refills have been done if requested. Please keep your appointments with your specialists as you have planned  Please remember to sign up for My Chart if you have not done so, as this will be important to you in the future with finding out test results, communicating by private email, and scheduling acute appointments online when needed.  Sorry, we did not have the Dulera or cymbalta samples today, and the rest of your medications are generic

## 2013-02-09 NOTE — Assessment & Plan Note (Signed)
For depomedrol IM, predpack asd, triam cr prn,  to f/u any worsening symptoms or concerns

## 2013-02-09 NOTE — Assessment & Plan Note (Signed)
stable overall by history and exam, recent data reviewed with pt, and pt to continue medical treatment as before,  to f/u any worsening symptoms or concerns Lab Results  Component Value Date   HGBA1C 6.5 10/07/2012   For f/u cbg's , call for > 200

## 2013-02-09 NOTE — Progress Notes (Addendum)
Subjective:    Patient ID: [name] [name], female    DOB: 1940-09-30, 72 y.o.   MRN: [id number]  HPI  Here with acute 5 days onset pruritic hive like rash to torso and extrem's and one particular area right inner elbow/upper arm large plaquelike erythema NT rash assoc with recent covering.  No fever,  Also with angioedema like swelling to bilat upper eyelids, with weepy eyes and itchy.  Does have several wks ongoing nasal allergy symptoms with clearish congestion, itch and sneezing, without fever, pain, ST, cough, swelling or wheezing, but minor compared to current rash.  Pt denies chest pain, increased sob or doe, wheezing, orthopnea, PND, increased LE swelling, palpitations, dizziness or syncope. No lip or tongue swelling.  Has new catheter for dialysis purpose Past Medical History  Diagnosis Date  . HYPOTHYROIDISM 02/17/2007    s/p surgical removal of goiter in 1997  . DIABETES MELLITUS, TYPE II 02/17/2007  . HYPERLIPIDEMIA 02/17/2007  . GOUT 05/29/2007  . ANEMIA-NOS 05/29/2007  . ANXIETY 03/23/2010  . 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 DEPRESSION 02/17/2007  . RESTLESS LEG SYNDROME 05/29/2007  . COMMON MIGRAINE 05/29/2007  . PERIPHERAL NEUROPATHY 05/29/2007  . HYPERTENSION 02/17/2007  . PERIPHERAL VASCULAR DISEASE 02/17/2007  . PEPTIC ULCER DISEASE 05/29/2007  . Chronic kidney disease 02/17/2007    Followed by Dr. Lowell Guitar  . OSTEOPENIA 09/22/2009  . SEIZURE DISORDER 02/17/2007  . Memory loss 01/24/2010  . Personal history of colonic polyps 11/16/2009  . Palpitations 09/08/2010  . Other diseases of vocal cords 1996  . HEART MURMUR, HX OF 10/02/2010  . 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 Chronic neck pain 12/13/2010  . GERD (gastroesophageal reflux disease)   . 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 Cancer of kidney 10/07/2012    Followed per Dr Mable Paris, MD, urology, [facility]   . Arthritis    Past Surgical History  Procedure Laterality Date  . Left  toe amputated  2006  . Bunionectomy  1980  . Goiter removal  1997  . Stress cardiolite  06/18/2006  . Tranthoracic echocardiogram  06/18/2006  . Electrocardiogram  05/29/2006  . Cholecystectomy    . Rotator cuff repair  Left    Dr. Lajoyce Corners  . Av fistula placement  03/13/2012    Procedure: ARTERIOVENOUS (AV) FISTULA CREATION;  Surgeon: Fransisco Hertz, MD;  Location: [facility] OR;  Service: Vascular;  Laterality: Right;  . Eye surgery      bilateral cataract removal  . Esophagoscopy w/ botox injection  07/22/2012    Procedure: ESOPHAGOSCOPY WITH BOTOX INJECTION;  Surgeon: Drema Halon, MD;  Location: [facility];  Service: ENT;  Laterality: N/A;  esophageal dilation  . Insertion of dialysis catheter N/A 02/05/2013    Procedure: INSERTION OF DIALYSIS CATHETER;  Surgeon: Chuck Hint, MD;  Location: [facility] OR;  Service: Vascular;  Laterality: N/A;  Ultrasound guided    reports that she has been smoking Cigarettes.  She has a 11.75 pack-year smoking history. She has never used smokeless tobacco. She reports that she does not drink alcohol or use illicit drugs. family history includes Coronary artery disease in her other; Dementia in her mother; Heart attack in her brother; Hyperlipidemia in her other; Hypertension in her brother, mother, other, and sister; Ovarian cancer in her other; and Stroke in her brother and other. Allergies  Allergen Reactions  . Nsaids     REACTION: renal dysfunction  .  Pioglitazone     Actos REACTION: EDEMA   Current Outpatient Prescriptions on File Prior to Visit  Medication Sig Dispense Refill  . allopurinol (ZYLOPRIM) 100 MG tablet Take 100 mg by mouth daily.       Marland Kitchen amLODipine (NORVASC) 5 MG tablet TAKE ONE TABLET BY MOUTH EVERY DAY  90 tablet  3  . aspirin EC 81 MG tablet Take 81 mg by mouth daily.      Marland Kitchen atorvastatin (LIPITOR) 40 MG tablet Take 1 tablet (40 mg total) by mouth daily.  30 tablet  11  . benzonatate (TESSALON PERLES) 100 MG capsule  1-2 tabs by mouth every 8 hrs as needed for cough  60 capsule  1  . calcium carbonate (OS-CAL) 600 MG TABS Take 600 mg by mouth 2 (two) times daily with a meal.      . clonazePAM (KLONOPIN) 0.5 MG tablet Take 1 tablet (0.5 mg total) by mouth 2 (two) times daily as needed for anxiety.  60 tablet  1  . cyclobenzaprine (FLEXERIL) 10 MG tablet Take 10 mg by mouth 3 (three) times daily as needed. For muscle spasms      . cyproheptadine (PERIACTIN) 4 MG tablet Take 1 tablet (4 mg total) by mouth 2 (two) times daily.  60 tablet  3  . dextromethorphan-guaiFENesin (MUCINEX DM) 30-600 MG per 12 hr tablet Take 1 tablet by mouth every 12 (twelve) hours.      . docusate sodium (COLACE) 100 MG capsule Take 100 mg by mouth 2 (two) times daily as needed. For constipation      . doxycycline (VIBRA-TABS) 100 MG tablet Take 100 mg by mouth 2 (two) times daily.      . DULoxetine (CYMBALTA) 60 MG capsule Take 60 mg by mouth daily.      . furosemide (LASIX) 80 MG tablet Take 80 mg by mouth daily.       . hydrOXYzine (ATARAX/VISTARIL) 25 MG tablet Take 25 mg by mouth every 6 (six) hours as needed. For nerves      . lactulose (CHRONULAC) 10 GM/15ML solution Take 20 g by mouth 3 (three) times daily.      Marland Kitchen levothyroxine (SYNTHROID, LEVOTHROID) 125 MCG tablet Take 125 mcg by mouth daily before breakfast.      . mometasone-formoterol (DULERA) 100-5 MCG/ACT AERO Inhale 2 puffs into the lungs 2 (two) times daily.  1 Inhaler  6  . montelukast (SINGULAIR) 10 MG tablet Take 10 mg by mouth at bedtime.      . multivitamin (RENA-VIT) TABS tablet Take 1 tablet by mouth daily.      Marland Kitchen omeprazole (PRILOSEC) 40 MG capsule Take 40 mg by mouth daily.       Marland Kitchen oxyCODONE-acetaminophen (PERCOCET/ROXICET) 5-325 MG per tablet Take 1 tablet by mouth every 4 (four) hours as needed for pain.      Marland Kitchen oxyCODONE-acetaminophen (ROXICET) 5-325 MG per tablet Take 1-2 tablets by mouth every 4 (four) hours as needed for pain.  20 tablet  0  . promethazine  (PHENERGAN) 25 MG tablet Take 25 mg by mouth every 6 (six) hours as needed for nausea.      Marland Kitchen QUEtiapine (SEROQUEL) 100 MG tablet Take 1 tablet (100 mg total) by mouth at bedtime.  90 tablet  3  . traZODone (DESYREL) 100 MG tablet Take 1 tablet (100 mg total) by mouth at bedtime as needed. For sleep  90 tablet  1   No current facility-administered medications on file prior to  visit.   Review of Systems  Constitutional: Negative for unexpected weight change, or unusual diaphoresis  HENT: Negative for tinnitus.   Eyes: Negative for photophobia and visual disturbance.  Respiratory: Negative for choking and stridor.   Gastrointestinal: Negative for vomiting and blood in stool.  Genitourinary: Negative for hematuria and decreased urine volume.  Musculoskeletal: Negative for acute joint swelling Skin: Negative for color change and wound.  Neurological: Negative for tremors and numbness other than noted  Psychiatric/Behavioral: Negative for decreased concentration or  hyperactivity.       Objective:   Physical Exam BP 110/70  Pulse 108  Temp(Src) 99.2 F (37.3 C) (Oral)  Ht 5\' 7"  (1.702 m)  Wt 144 lb 4 oz (65.431 kg)  BMI 22.59 kg/m2  SpO2 97% VS noted,  Constitutional: Pt appears well-developed and well-nourished.  HENT: Head: NCAT.  Right Ear: External ear normal.  Left Ear: External ear normal.  Eyes: Conjunctivae and EOM are normal. Pupils are equal, round, and reactive to light.  Neck: Normal range of motion. Neck supple.  Cardiovascular: Normal rate and regular rhythm.   Pulmonary/Chest: Effort normal and breath sounds normal.  - no wheezing Neurological: Pt is alert. Not confused  Skin: Has diffuse hive like rash, also NT angioedema like swelling bilat upper eyelids, als oarge NT plaque like erythem area right upper arm/elbow, No LE edema Psychiatric: Pt behavior is normal. Thought content normal    Assessment & Plan:  Quality Measures addressed:  Mammogram:  pt declines  and will self-refer Colorectal Cancer screening: pt declines all at this time, including FOBT, flex sig, colonoscopy  Diabetes LDL < 100: pt declines further medication Diabetes Hgba1c < 8%: pt declines further medication  Diabetes Tobacco non-use: pt declines to quit smoking

## 2013-02-09 NOTE — Assessment & Plan Note (Signed)
stable overall by history and exam, recent data reviewed with pt, and pt to continue medical treatment as before,  to f/u any worsening symptoms or concerns SpO2 Readings from Last 3 Encounters:  02/09/13 97%  02/05/13 99%  02/05/13 99%

## 2013-02-13 ENCOUNTER — Telehealth: Payer: Self-pay

## 2013-02-13 ENCOUNTER — Telehealth: Payer: Self-pay | Admitting: *Deleted

## 2013-02-13 NOTE — Telephone Encounter (Signed)
Pt called states she needs PA for Atarax Rx.  Please advise

## 2013-02-13 NOTE — Telephone Encounter (Signed)
Pt's niece Natividad Brood) to follow up on the PA request. Pt's niece stated none of the medication work and they would like to get this PA start ASAP. Pt's niece request phone call from the nurse.

## 2013-02-13 NOTE — Telephone Encounter (Signed)
Patient informed. 

## 2013-02-13 NOTE — Telephone Encounter (Signed)
PA approval for hydroxyzine from today 02/13/13 through 08/05/13 Case [id number].  Patient has been informed and pharmacy as well.  Letter will be faxed to our office of approval as well.

## 2013-02-13 NOTE — Telephone Encounter (Signed)
If dont get PA, then ok for otc benadryl prn

## 2013-02-13 NOTE — Telephone Encounter (Signed)
Informed the patient we have not received a PA on this medication.  Informed the patient to have the pharmacy fax a PA form to our office and we would proceed with the PA.

## 2013-02-18 ENCOUNTER — Other Ambulatory Visit: Payer: Self-pay | Admitting: Internal Medicine

## 2013-02-22 ENCOUNTER — Other Ambulatory Visit: Payer: Self-pay | Admitting: Adult Health

## 2013-02-23 ENCOUNTER — Telehealth: Payer: Self-pay

## 2013-02-23 ENCOUNTER — Other Ambulatory Visit: Payer: Self-pay

## 2013-02-23 DIAGNOSIS — R21 Rash and other nonspecific skin eruption: Secondary | ICD-10-CM

## 2013-02-23 MED ORDER — HYDROXYZINE HCL 25 MG PO TABS: 50.0000 mg | ORAL_TABLET | Freq: Four times a day (QID) | ORAL | Status: DC | PRN

## 2013-02-23 NOTE — Telephone Encounter (Signed)
The patient would like a referral to a new dermatologist as her previous (Dr. Joseph Art she was informed pass away). She is continuing to have a rash.  Also stated her dialysis MD stated ok to increase from 3 to 4 hydroxyzine due to itching as 3 is not helping. If ok needs refill

## 2013-02-23 NOTE — Telephone Encounter (Signed)
Done per emr  Ok for increased atarax  - done erx

## 2013-02-23 NOTE — Telephone Encounter (Signed)
Patient informed. 

## 2013-02-23 NOTE — Telephone Encounter (Signed)
The percocet was prescribed per Dr Edilia Bo  She has received rx for vicodin in the past from me  Pt needs to ask Dr Edilia Bo if needs percocet refill

## 2013-02-23 NOTE — Telephone Encounter (Signed)
A user error has taken place: encounter opened in error, closed for administrative reasons.

## 2013-02-24 ENCOUNTER — Encounter: Payer: Self-pay | Admitting: Vascular Surgery

## 2013-02-24 NOTE — Telephone Encounter (Signed)
Patient informed of MD's response to request

## 2013-02-25 ENCOUNTER — Ambulatory Visit (INDEPENDENT_AMBULATORY_CARE_PROVIDER_SITE_OTHER): Payer: Medicare Other | Admitting: Vascular Surgery

## 2013-02-25 ENCOUNTER — Encounter: Payer: Self-pay | Admitting: Vascular Surgery

## 2013-02-25 VITALS — BP 121/55 | HR 100 | Ht 67.0 in | Wt 146.1 lb

## 2013-02-25 DIAGNOSIS — N186 End stage renal disease: Secondary | ICD-10-CM

## 2013-02-25 NOTE — Progress Notes (Signed)
[facility]  Patient name: [name] [name] MRN: [id number] DOB: 1940/12/26 Sex: female  REASON FOR VISIT: follow up of right basilic vein transposition  HPI: [name] [name] is a 72 y.o. female who had a basilic vein transposition placed in the right arm. The second stage was performed on 04/23/2012. The fistula had been working well. Saw her in consultation on 7-14 she had a rash over her entire right upper arm. I do not think it would be wise to cannulate the fistula with a rash and therefore I placed a catheter on 02/05/2013. She comes in for a follow up visit. Rash has completely resolved. She continues to have some problems with itching after dialysis. She is scheduled to see a dermatologist today.  REVIEW OF SYSTEMS: Arly.Keller ] denotes positive finding; [  ] denotes negative finding  CARDIOVASCULAR:  [ ]  chest pain   [ ]  dyspnea on exertion    CONSTITUTIONAL:  [ ]  fever   [ ]  chills  PHYSICAL EXAM: Filed Vitals:   02/25/13 0911  BP: 121/55  Pulse: 100  Height: 5\' 7"  (1.702 m)  Weight: 146 lb 1.6 oz (66.271 kg)  SpO2: 90%   Body mass index is 22.88 kg/(m^2). GENERAL: The patient is a well-nourished female, in no acute distress. The vital signs are documented above. CARDIOVASCULAR: There is a regular rate and rhythm  PULMONARY: There is good air exchange bilaterally without wheezing or rales. Her fistula has an excellent thrill and bruit. She has a palpable right radial pulse the  MEDICAL ISSUES: At this point I think it is safe to use the fistula. Once we know that this is working well we can arrange to have the catheter removed in short stay. The etiology of her rash and itching however at this point is not clear. We will see her back as needed.  DICKSON,CHRISTOPHER S [facility] [place] Beeper: [phone]

## 2013-02-25 NOTE — Telephone Encounter (Signed)
Sorry, the answer is the same, due to the post procedure nature of her pain, and the controlled substance involved

## 2013-02-25 NOTE — Telephone Encounter (Signed)
The patient called back this am requesting percocet refill again.  Stated the vicodin is not working for her pain.  I informed the patient of previous request and MD's response, but patient requested to ask once again.

## 2013-02-25 NOTE — Telephone Encounter (Signed)
Called the patient informed of MD instructions on request.

## 2013-03-04 ENCOUNTER — Other Ambulatory Visit: Payer: Self-pay | Admitting: Adult Health

## 2013-03-05 ENCOUNTER — Other Ambulatory Visit: Payer: Self-pay | Admitting: Internal Medicine

## 2013-03-06 ENCOUNTER — Other Ambulatory Visit: Payer: Self-pay

## 2013-03-06 ENCOUNTER — Other Ambulatory Visit: Payer: Self-pay | Admitting: Adult Health

## 2013-03-06 MED ORDER — TRAZODONE HCL 100 MG PO TABS: 100.0000 mg | ORAL_TABLET | Freq: Every evening | ORAL | Status: DC | PRN

## 2013-03-06 MED ORDER — MONTELUKAST SODIUM 10 MG PO TABS: 10.0000 mg | ORAL_TABLET | Freq: Every day | ORAL | Status: DC

## 2013-03-06 NOTE — Telephone Encounter (Signed)
Done erx 

## 2013-03-09 ENCOUNTER — Telehealth: Payer: Self-pay | Admitting: Internal Medicine

## 2013-03-09 NOTE — Telephone Encounter (Signed)
Rec'd from [facility] forward 2 pages to Dr.John

## 2013-03-11 ENCOUNTER — Telehealth: Payer: Self-pay | Admitting: Internal Medicine

## 2013-03-11 MED ORDER — HYDROCODONE-ACETAMINOPHEN 5-325 MG PO TABS: 1.0000 | ORAL_TABLET | Freq: Four times a day (QID) | ORAL | Status: DC | PRN

## 2013-03-11 MED ORDER — LACTULOSE 10 GM/15ML PO SOLN: 20.0000 g | Freq: Three times a day (TID) | ORAL | Status: DC

## 2013-03-11 NOTE — Telephone Encounter (Signed)
Done hardcopy to robin  

## 2013-03-11 NOTE — Telephone Encounter (Signed)
Faxed hardcopy to [employer] called the patient left msg. To inform

## 2013-03-11 NOTE — Telephone Encounter (Signed)
Refilled lactulose as requested.  Called the patient and informed.  She is now requesting a refill on her hydrocodone

## 2013-03-11 NOTE — Telephone Encounter (Signed)
Ok - to robin to handle 

## 2013-03-11 NOTE — Telephone Encounter (Signed)
Pt is requesting a refill on Lactulose to be called into [facility] on [address].  She had it while in the nursing home.

## 2013-03-31 ENCOUNTER — Other Ambulatory Visit: Payer: Self-pay | Admitting: Adult Health

## 2013-04-01 ENCOUNTER — Telehealth: Payer: Self-pay

## 2013-04-01 MED ORDER — BLOOD PRESSURE CUFF MISC: 1.0000 "application " | Freq: Every day | Status: DC

## 2013-04-01 NOTE — Telephone Encounter (Signed)
Pt called lm requesting a RX for BP cuff ot be sent to [facility]. She states that medicare will pay if rx received and would like this done today. Thanks

## 2013-04-01 NOTE — Telephone Encounter (Signed)
rx done to [facility] 

## 2013-04-02 NOTE — Telephone Encounter (Signed)
Called the patient left detailed msg. Request sent in.

## 2013-04-03 ENCOUNTER — Other Ambulatory Visit: Payer: Self-pay | Admitting: Adult Health

## 2013-04-03 ENCOUNTER — Other Ambulatory Visit: Payer: Self-pay | Admitting: Internal Medicine

## 2013-04-03 NOTE — Telephone Encounter (Signed)
Faxed hardcopy to pharmacy. 

## 2013-04-03 NOTE — Telephone Encounter (Signed)
Done hardcopy to robin  

## 2013-04-09 ENCOUNTER — Other Ambulatory Visit: Payer: Self-pay | Admitting: Adult Health

## 2013-04-10 ENCOUNTER — Telehealth: Payer: Self-pay

## 2013-04-10 MED ORDER — PROMETHAZINE HCL 25 MG PO TABS: 25.0000 mg | ORAL_TABLET | Freq: Four times a day (QID) | ORAL | Status: DC | PRN

## 2013-04-10 NOTE — Telephone Encounter (Signed)
refill 

## 2013-04-13 ENCOUNTER — Other Ambulatory Visit: Payer: Self-pay | Admitting: Adult Health

## 2013-04-13 ENCOUNTER — Other Ambulatory Visit: Payer: Self-pay | Admitting: Internal Medicine

## 2013-04-13 NOTE — Telephone Encounter (Signed)
Refill done.  

## 2013-04-20 ENCOUNTER — Other Ambulatory Visit: Payer: Self-pay | Admitting: Adult Health

## 2013-04-21 ENCOUNTER — Telehealth: Payer: Self-pay

## 2013-04-21 MED ORDER — ONDANSETRON HCL 4 MG PO TABS: 4.0000 mg | ORAL_TABLET | Freq: Three times a day (TID) | ORAL | Status: DC | PRN

## 2013-04-21 NOTE — Telephone Encounter (Signed)
Patient informed. 

## 2013-04-21 NOTE — Telephone Encounter (Signed)
Ok to try change to zofran prn - done erx

## 2013-04-21 NOTE — Telephone Encounter (Signed)
The patient would like something else sent in to take for nausea.  She has been taking promethazine, but is no longer working.  Please advise

## 2013-04-22 ENCOUNTER — Other Ambulatory Visit: Payer: Self-pay

## 2013-04-22 ENCOUNTER — Other Ambulatory Visit: Payer: Self-pay | Admitting: Adult Health

## 2013-04-22 MED ORDER — ATORVASTATIN CALCIUM 40 MG PO TABS: 40.0000 mg | ORAL_TABLET | Freq: Every day | ORAL | Status: DC

## 2013-05-07 ENCOUNTER — Other Ambulatory Visit: Payer: Self-pay | Admitting: Internal Medicine

## 2013-05-27 ENCOUNTER — Other Ambulatory Visit: Payer: Self-pay | Admitting: Internal Medicine

## 2013-06-01 ENCOUNTER — Other Ambulatory Visit: Payer: Self-pay | Admitting: *Deleted

## 2013-06-01 ENCOUNTER — Other Ambulatory Visit: Payer: Self-pay | Admitting: Internal Medicine

## 2013-06-03 ENCOUNTER — Encounter (HOSPITAL_COMMUNITY): Payer: Medicare Other

## 2013-06-05 ENCOUNTER — Other Ambulatory Visit (HOSPITAL_COMMUNITY): Payer: Self-pay | Admitting: *Deleted

## 2013-06-05 ENCOUNTER — Other Ambulatory Visit: Payer: Self-pay

## 2013-06-05 ENCOUNTER — Telehealth: Payer: Self-pay

## 2013-06-05 ENCOUNTER — Other Ambulatory Visit: Payer: Self-pay | Admitting: Adult Health

## 2013-06-05 MED ORDER — HYDROCODONE-ACETAMINOPHEN 5-325 MG PO TABS: 1.0000 | ORAL_TABLET | Freq: Four times a day (QID) | ORAL | Status: DC | PRN

## 2013-06-05 MED ORDER — OMEPRAZOLE 40 MG PO CPDR: 40.0000 mg | DELAYED_RELEASE_CAPSULE | Freq: Every day | ORAL | Status: DC

## 2013-06-05 NOTE — Telephone Encounter (Signed)
Done hardcopy to [name] [name]  You are given the letter today explaining the transitional pain medication refill policy  Please be aware that I will no longer be able to offer monthly refills of any Schedule II or higher medication starting Sep 06, 2013

## 2013-06-05 NOTE — Telephone Encounter (Signed)
Called informed the patient to pickup hardcopy's and letter as well at the front desk.

## 2013-06-05 NOTE — Telephone Encounter (Signed)
refill 

## 2013-06-08 ENCOUNTER — Encounter (HOSPITAL_COMMUNITY): Payer: Medicare Other

## 2013-06-08 ENCOUNTER — Other Ambulatory Visit: Payer: Self-pay | Admitting: *Deleted

## 2013-06-08 MED ORDER — HYDROXYZINE HCL 25 MG PO TABS: 50.0000 mg | ORAL_TABLET | Freq: Four times a day (QID) | ORAL | Status: DC | PRN

## 2013-06-10 ENCOUNTER — Other Ambulatory Visit: Payer: Self-pay | Admitting: Internal Medicine

## 2013-06-11 ENCOUNTER — Other Ambulatory Visit: Payer: Self-pay

## 2013-06-12 ENCOUNTER — Other Ambulatory Visit: Payer: Self-pay | Admitting: Internal Medicine

## 2013-07-08 ENCOUNTER — Telehealth: Payer: Self-pay | Admitting: Internal Medicine

## 2013-07-08 DIAGNOSIS — R112 Nausea with vomiting, unspecified: Secondary | ICD-10-CM

## 2013-07-08 NOTE — Telephone Encounter (Signed)
Pt has vomited several times suddenly.  No nausea before.  Happened yesterday at dialysis.  Blood pressure also dropped yesterday while at dialysis.  She wants to know if she need to come here or heart or GI.  She wants to know if she needs a referral to cardiology.  She thinks she saw someone years ago.

## 2013-07-08 NOTE — Telephone Encounter (Signed)
Best d/w nephrology

## 2013-07-09 ENCOUNTER — Other Ambulatory Visit: Payer: Self-pay | Admitting: Internal Medicine

## 2013-07-09 NOTE — Telephone Encounter (Signed)
Done hardcopy to robin  

## 2013-07-09 NOTE — Telephone Encounter (Signed)
Called the patient unable to leave message as mailbox full.

## 2013-07-09 NOTE — Telephone Encounter (Signed)
Pt called requesting a referral to Dr Marisue Brooklyn [phone]

## 2013-07-09 NOTE — Telephone Encounter (Signed)
Faxed hardcopy to [facility] Dr. Manley Mason

## 2013-07-09 NOTE — Telephone Encounter (Signed)
Done per emr 

## 2013-07-09 NOTE — Telephone Encounter (Signed)
Called the patient informed of MD instructions.  The patient did talk to nephrology (they said to talk to PCP).  The patient states this has happened several times at home.  She is going to see her nephrology to at dialysis.

## 2013-07-09 NOTE — Telephone Encounter (Signed)
I can see her, or if she has a GI this might be ok as well for the recurring vomiting.  Low BP with dialysis is a common issue and is normally addressed per nephrology

## 2013-07-14 ENCOUNTER — Other Ambulatory Visit: Payer: Self-pay | Admitting: Internal Medicine

## 2013-08-09 ENCOUNTER — Other Ambulatory Visit: Payer: Self-pay | Admitting: Internal Medicine

## 2013-08-18 ENCOUNTER — Other Ambulatory Visit: Payer: Self-pay

## 2013-08-19 ENCOUNTER — Inpatient Hospital Stay (HOSPITAL_COMMUNITY): Admission: RE | Admit: 2013-08-19 | Payer: Medicare Other | Source: Ambulatory Visit

## 2013-08-21 ENCOUNTER — Other Ambulatory Visit: Payer: Self-pay | Admitting: Internal Medicine

## 2013-08-26 ENCOUNTER — Encounter (HOSPITAL_COMMUNITY)
Admission: RE | Admit: 2013-08-26 | Discharge: 2013-08-26 | Disposition: A | Discharge status: Home / Self Care | Payer: Medicare Other | Source: Ambulatory Visit | Attending: Vascular Surgery | Admitting: Vascular Surgery

## 2013-08-26 DIAGNOSIS — Z452 Encounter for adjustment and management of vascular access device: Secondary | ICD-10-CM | POA: Insufficient documentation

## 2013-08-26 DIAGNOSIS — N186 End stage renal disease: Secondary | ICD-10-CM | POA: Insufficient documentation

## 2013-08-26 DIAGNOSIS — I120 Hypertensive chronic kidney disease with stage 5 chronic kidney disease or end stage renal disease: Secondary | ICD-10-CM | POA: Insufficient documentation

## 2013-08-26 NOTE — Discharge Instructions (Signed)
Wound Care  HOME CARE   Only take medicine as told by your doctor.  Clean the wound daily with mild soap and water.  Change any bandages (dressings) as told by your doctor.  Put medicated cream and a bandage on the wound as told by your doctor.  Change the bandage if it gets wet, dirty, or starts to smell.  Take showers. Do not take baths, swim, or do anything that puts your wound under water.  Rest and raise (elevate) the wound until the pain and puffiness (swelling) are better.  Keep all doctor visits as told. GET HELP RIGHT AWAY IF:   Yellowish-white fluid (pus) comes from the wound.  Medicine does not lessen your pain.  There is a red streak going away from the wound.  You have a fever. MAKE SURE YOU:   Understand these instructions.  Will watch your condition.  Will get help right away if you are not doing well or get worse. Document Released: 05/01/2008 Document Revised: 10/15/2011 Document Reviewed: 11/26/2010 [facility] Patient Information 2014 [place], [place].

## 2013-08-26 NOTE — Progress Notes (Signed)
[facility] SHORT STAY H&P  CC: ESRD   HPI: [name] [name] is a 73 y.o. female who has been on HD through  functioning Hemodialysis access in the left lower extremity. They are here for HD catheter removal. Pt. denies signs of steal syndrome.  Past Medical History  Diagnosis Date  . HYPOTHYROIDISM 02/17/2007    s/p surgical removal of goiter in 1997  . DIABETES MELLITUS, TYPE II 02/17/2007  . HYPERLIPIDEMIA 02/17/2007  . GOUT 05/29/2007  . ANEMIA-NOS 05/29/2007  . ANXIETY 03/23/2010  . 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 DEPRESSION 02/17/2007  . RESTLESS LEG SYNDROME 05/29/2007  . COMMON MIGRAINE 05/29/2007  . PERIPHERAL NEUROPATHY 05/29/2007  . HYPERTENSION 02/17/2007  . PERIPHERAL VASCULAR DISEASE 02/17/2007  . PEPTIC ULCER DISEASE 05/29/2007  . Chronic kidney disease 02/17/2007    Followed by Dr. Florene Glen  . OSTEOPENIA 09/22/2009  . SEIZURE DISORDER 02/17/2007  . Memory loss 01/24/2010  . Personal history of colonic polyps 11/16/2009  . Palpitations 09/08/2010  . Other diseases of vocal cords 1996  . HEART MURMUR, HX OF 10/02/2010  . 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 Chronic neck pain 12/13/2010  . GERD (gastroesophageal reflux disease)   . 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 Cancer of kidney 10/07/2012    Followed per Dr Despina Pole, MD, urology, [place]   . Arthritis     Family Hx Family History  Problem Relation Age of Onset  . Dementia Mother   . Hypertension Mother   . Coronary artery disease Other   . Hyperlipidemia Other   . Hypertension Other   . Ovarian cancer Other   . Stroke Other   . Hypertension Sister   . Hypertension Brother   . Heart attack Brother   . Stroke Brother     Social HX History  Substance Use Topics  . Smoking status: Current Some Day Smoker -- 0.25 packs/day for 47 years    Types: Cigarettes  . Smokeless tobacco: Never Used     Comment: pt waiting to purchase patches  . Alcohol  Use: No    Allergies Allergies  Allergen Reactions  . Cephalosporins Itching and Rash    Vanc and fortaz given at the same time in June for several doses at dialysis with systemic rash and itching; received zinacef 7/5 and had worseningsystemicrash/ itching and swelling of eyes - so unclear if allergic to either or both  . Nsaids     REACTION: renal dysfunction  . Pioglitazone     Actos REACTION: EDEMA  . Vancomycin     See comment under cephalosporin    Medications Current Outpatient Prescriptions  Medication Sig Dispense Refill  . allopurinol (ZYLOPRIM) 100 MG tablet Take 100 mg by mouth daily.       Marland Kitchen allopurinol (ZYLOPRIM) 100 MG tablet TAKE ONE TABLET BY MOUTH EVERY DAY  90 tablet  2  . amLODipine (NORVASC) 5 MG tablet TAKE ONE TABLET BY MOUTH EVERY DAY  90 tablet  3  . aspirin EC 81 MG tablet Take 81 mg by mouth daily.      Marland Kitchen atorvastatin (LIPITOR) 40 MG tablet Take 1 tablet (40 mg total) by mouth daily.  30 tablet  11  . benzonatate (TESSALON PERLES) 100 MG capsule 1-2 tabs by mouth every 8 hrs as needed for cough  60 capsule  1  . Blood Pressure Monitoring (BLOOD PRESSURE CUFF) MISC 1 application by Does  not apply route daily.  1 each  0  . calcium carbonate (OS-CAL) 600 MG TABS Take 600 mg by mouth 2 (two) times daily with a meal.      . clonazePAM (KLONOPIN) 0.5 MG tablet Take 1 tablet (0.5 mg total) by mouth 2 (two) times daily as needed for anxiety.  60 tablet  1  . clonazePAM (KLONOPIN) 2 MG tablet TAKE ONE TABLET BY MOUTH TWICE DAILY AS NEEDED  60 tablet  2  . cyclobenzaprine (FLEXERIL) 10 MG tablet Take 10 mg by mouth 3 (three) times daily as needed. For muscle spasms      . cyproheptadine (PERIACTIN) 4 MG tablet Take 1 tablet (4 mg total) by mouth 2 (two) times daily.  60 tablet  3  . dextromethorphan-guaiFENesin (MUCINEX DM) 30-600 MG per 12 hr tablet Take 1 tablet by mouth every 12 (twelve) hours.      . docusate sodium (COLACE) 100 MG capsule Take 100 mg by mouth 2  (two) times daily as needed. For constipation      . doxycycline (VIBRA-TABS) 100 MG tablet Take 100 mg by mouth 2 (two) times daily.      . DULoxetine (CYMBALTA) 60 MG capsule Take 60 mg by mouth daily.      . furosemide (LASIX) 80 MG tablet Take 80 mg by mouth daily.       Marland Kitchen HYDROcodone-acetaminophen (NORCO/VICODIN) 5-325 MG per tablet Take 1 tablet by mouth every 6 (six) hours as needed for pain. To fill Aug 07, 2013  120 tablet  0  . hydrOXYzine (ATARAX/VISTARIL) 25 MG tablet Take 2 tablets (50 mg total) by mouth every 6 (six) hours as needed. For nerves  60 tablet  3  . lactulose (CHRONULAC) 10 GM/15ML solution TAKE 30 MLS BY MOUTH THREE  TIMES A DAY  240 mL  0  . lactulose (CHRONULAC) 10 GM/15ML solution TAKE 30 MLS BY MOUTH THREE TIMES DAILY  236 mL  6  . levothyroxine (SYNTHROID, LEVOTHROID) 125 MCG tablet Take 125 mcg by mouth daily before breakfast.      . levothyroxine (SYNTHROID, LEVOTHROID) 125 MCG tablet TAKE ONE TABLET BY MOUTH EVERY DAY  90 tablet  3  . mometasone-formoterol (DULERA) 100-5 MCG/ACT AERO Inhale 2 puffs into the lungs 2 (two) times daily.  1 Inhaler  6  . montelukast (SINGULAIR) 10 MG tablet Take 1 tablet (10 mg total) by mouth at bedtime.  30 tablet  11  . multivitamin (RENA-VIT) TABS tablet Take 1 tablet by mouth daily.      Marland Kitchen omeprazole (PRILOSEC) 40 MG capsule Take 40 mg by mouth daily.       Marland Kitchen omeprazole (PRILOSEC) 40 MG capsule Take 1 capsule (40 mg total) by mouth daily.  30 capsule  11  . ondansetron (ZOFRAN) 4 MG tablet Take 1 tablet (4 mg total) by mouth every 8 (eight) hours as needed for nausea.  40 tablet  1  . predniSONE (DELTASONE) 10 MG tablet 3 tabs by mouth per day for 3 days,2tabs per day for 3 days,1tab per day for 3 days  18 tablet  0  . promethazine (PHENERGAN) 25 MG tablet Take 1 tablet (25 mg total) by mouth every 6 (six) hours as needed for nausea.  30 tablet  2  . QUEtiapine (SEROQUEL) 100 MG tablet Take 1 tablet (100 mg total) by mouth at  bedtime.  90 tablet  3  . traZODone (DESYREL) 100 MG tablet Take 1 tablet (100 mg total) by  mouth at bedtime as needed. For sleep  90 tablet  1  . triamcinolone cream (KENALOG) 0.1 % APPLY  TO AFFECTED AREA TWICE DAILY  30 g  11   No current facility-administered medications for this encounter.    Labs COAG Lab Results  Component Value Date   INR 1.09 02/23/2011   No results found for this basename: PTT    PHYSICAL EXAM  Filed Vitals:   08/26/13 1111  BP: 154/78  Pulse: 87  Temp: 98.7 F (37.1 C)  Resp: 20    General:  WDWN in NAD HENT: WNL Eyes: Pupils equal Pulmonary: normal non-labored breathing  Cardiac: RRR, Skin: normal, no cyanosis, jaundice, pallor or bruising Vascular Exam/Pulses: 2+ radial pulses in bilateral extremity. Extremities without ischemic changes, no Gangrene , no cellulitis; no open wounds;   There is a good thrill and good bruit in the fistula. Hand grip is 5/5 and sensation in digits is intact;   Impression: This is a 73 y.o. female who has a functioning HD access.  Plan: Removal of Right IJ HD catheter Theda Sers, [facility] [place] [facility] 08/26/2013 11:38 AM   [facility] Catheter Removal Procedure Note  Diagnosis: ESRD with Functioning AVF/AVGG  Plan:  Remove right diatek catheter  Consent signed:  yes Time out completed:  yes Coumadin:  no PT/INR (if applicable):   Other labs:   Procedure: 1.  Sterile prepping and draping over catheter area 2. 0 ml 2% lidocaine plain instilled at removal site. 3.  right catheter removed in its entirety with cuff in tact. 4.  Complications: none  5. Tip of catheter sent for culture:  no   Patient tolerated procedure well:  yes Pressure held, no bleeding noted, dressing applied Instructions given to the pt regarding wound care and bleeding.  OtherLaurence Slate [facility] 08/26/2013 11:38 AM

## 2013-08-27 ENCOUNTER — Telehealth: Payer: Self-pay | Admitting: *Deleted

## 2013-08-27 DIAGNOSIS — F329 Major depressive disorder, single episode, unspecified: Secondary | ICD-10-CM

## 2013-08-27 DIAGNOSIS — F32A Depression, unspecified: Secondary | ICD-10-CM

## 2013-08-27 NOTE — Telephone Encounter (Signed)
Pt called requesting referral to [facility].  Please advise

## 2013-08-27 NOTE — Telephone Encounter (Signed)
Done per emr 

## 2013-08-28 NOTE — Telephone Encounter (Signed)
Spoke with pt advised referral placed.

## 2013-08-31 ENCOUNTER — Encounter: Payer: Self-pay | Admitting: Gastroenterology

## 2013-09-06 ENCOUNTER — Other Ambulatory Visit: Payer: Self-pay | Admitting: Internal Medicine

## 2013-09-18 ENCOUNTER — Encounter: Payer: Self-pay | Admitting: Physician Assistant

## 2013-09-18 ENCOUNTER — Ambulatory Visit (INDEPENDENT_AMBULATORY_CARE_PROVIDER_SITE_OTHER): Payer: Medicare Other | Admitting: Physician Assistant

## 2013-09-18 VITALS — BP 138/60 | HR 82 | Ht 64.0 in | Wt 146.0 lb

## 2013-09-18 DIAGNOSIS — R112 Nausea with vomiting, unspecified: Secondary | ICD-10-CM

## 2013-09-18 DIAGNOSIS — R110 Nausea: Secondary | ICD-10-CM

## 2013-09-18 NOTE — Patient Instructions (Signed)
Please call us back once you have had a chance to think about what you want to do regarding the Endscopy.  [phone].

## 2013-09-18 NOTE — Progress Notes (Signed)
Subjective:    Patient ID: [name] [name], female    DOB: 28-Jun-1941, 73 y.o.   MRN: [id number]  HPI  [name] [name]  is a 73 year old African American female who comes in today for evaluation of intermittent vomiting and periodic nausea which does not seem to be associated with the vomiting. Patient was not sure whether she had been seen here before or not but review of records show that she did have colonoscopy with Dr. Ardis Hughs in 2011 for screening. Patient had had an incomplete colonoscopy with Dr. Lajoyce Corners in 2005 MB had shown a very tortuous colon but no polyps. She was found to have 3 small sessile polyps and otherwise negative exam. Path from the polyps consistent with  tubular adenomas. Patient recalls seeing Dr. Collene Mares in the past and says she had gone to see her recently with her vomiting complaints but was told that she couldn't be seen because she had seen Dr. Ardis Hughs. She doesn't remember seeing any one in our group and seems confused about why she is here today. She is a.dialysis patient with end-stage renal disease and also has history of renal cell carcinoma status post partial nephrectomy. She has history of hypertension, COPD and diabetes mellitus as well as memory loss. She says she has had intermittent vomiting over the past 3-4 months. She says this seems to be sporadic and is not associated with eating. She says she does not get nauseated or have any pain prior to these episodes and says that just comes on out of the blue and she vomits. She says generally she has not vomiting undigested food but vomits up liquid. She also complains of frequent bouts of nausea. She did have a prescription for Zofran which was helpful but says she's out of it. She has no complaints of abdominal pain. Her appetite has been stable for her in her weight has been stable. She denies any dysphagia or odynophagia. She occasionally has mild constipation no recent changes no melena or hematochezia. She is on numerous medications  including omeprazole 40 mg daily She is status post cholecystectomy    Review of Systems  Constitutional: Negative.   HENT: Negative.   Eyes: Negative.   Respiratory: Negative.   Gastrointestinal: Positive for nausea, vomiting and constipation.  Endocrine: Negative.   Genitourinary: Negative.   Musculoskeletal: Negative.   Skin: Negative.   Allergic/Immunologic: Negative.   Neurological: Negative.   Hematological: Negative.   Psychiatric/Behavioral: Negative.    Outpatient Prescriptions Prior to Visit  Medication Sig Dispense Refill  . allopurinol (ZYLOPRIM) 100 MG tablet Take 100 mg by mouth daily.       Marland Kitchen allopurinol (ZYLOPRIM) 100 MG tablet TAKE ONE TABLET BY MOUTH EVERY DAY  90 tablet  2  . amLODipine (NORVASC) 5 MG tablet TAKE ONE TABLET BY MOUTH EVERY DAY  90 tablet  3  . aspirin EC 81 MG tablet Take 81 mg by mouth daily.      Marland Kitchen atorvastatin (LIPITOR) 40 MG tablet Take 1 tablet (40 mg total) by mouth daily.  30 tablet  11  . benzonatate (TESSALON PERLES) 100 MG capsule 1-2 tabs by mouth every 8 hrs as needed for cough  60 capsule  1  . Blood Pressure Monitoring (BLOOD PRESSURE CUFF) MISC 1 application by Does not apply route daily.  1 each  0  . calcium carbonate (OS-CAL) 600 MG TABS Take 600 mg by mouth 2 (two) times daily with a meal.      . clonazePAM (  KLONOPIN) 0.5 MG tablet Take 1 tablet (0.5 mg total) by mouth 2 (two) times daily as needed for anxiety.  60 tablet  1  . clonazePAM (KLONOPIN) 2 MG tablet TAKE ONE TABLET BY MOUTH TWICE DAILY AS NEEDED  60 tablet  2  . cyclobenzaprine (FLEXERIL) 10 MG tablet Take 10 mg by mouth 3 (three) times daily as needed. For muscle spasms      . cyproheptadine (PERIACTIN) 4 MG tablet Take 1 tablet (4 mg total) by mouth 2 (two) times daily.  60 tablet  3  . dextromethorphan-guaiFENesin (MUCINEX DM) 30-600 MG per 12 hr tablet Take 1 tablet by mouth every 12 (twelve) hours.      . docusate sodium (COLACE) 100 MG capsule Take 100 mg by  mouth 2 (two) times daily as needed. For constipation      . doxycycline (VIBRA-TABS) 100 MG tablet Take 100 mg by mouth 2 (two) times daily.      . DULoxetine (CYMBALTA) 60 MG capsule Take 60 mg by mouth daily.      Marland Kitchen HYDROcodone-acetaminophen (NORCO/VICODIN) 5-325 MG per tablet Take 1 tablet by mouth every 6 (six) hours as needed for pain. To fill Aug 07, 2013  120 tablet  0  . hydrOXYzine (ATARAX/VISTARIL) 25 MG tablet TAKE TWO TABLETS BY MOUTH EVERY 6 HOURS AS NEEDED FOR  NERVES  60 tablet  0  . lactulose (CHRONULAC) 10 GM/15ML solution TAKE 30 MLS BY MOUTH THREE  TIMES A DAY  240 mL  0  . lactulose (CHRONULAC) 10 GM/15ML solution TAKE 30 MLS BY MOUTH THREE TIMES DAILY  236 mL  6  . levothyroxine (SYNTHROID, LEVOTHROID) 125 MCG tablet Take 125 mcg by mouth daily before breakfast.      . levothyroxine (SYNTHROID, LEVOTHROID) 125 MCG tablet TAKE ONE TABLET BY MOUTH EVERY DAY  90 tablet  3  . mometasone-formoterol (DULERA) 100-5 MCG/ACT AERO Inhale 2 puffs into the lungs 2 (two) times daily.  1 Inhaler  6  . montelukast (SINGULAIR) 10 MG tablet Take 1 tablet (10 mg total) by mouth at bedtime.  30 tablet  11  . multivitamin (RENA-VIT) TABS tablet Take 1 tablet by mouth daily.      Marland Kitchen omeprazole (PRILOSEC) 40 MG capsule Take 40 mg by mouth daily.       Marland Kitchen omeprazole (PRILOSEC) 40 MG capsule Take 1 capsule (40 mg total) by mouth daily.  30 capsule  11  . ondansetron (ZOFRAN) 4 MG tablet Take 1 tablet (4 mg total) by mouth every 8 (eight) hours as needed for nausea.  40 tablet  1  . predniSONE (DELTASONE) 10 MG tablet 3 tabs by mouth per day for 3 days,2tabs per day for 3 days,1tab per day for 3 days  18 tablet  0  . promethazine (PHENERGAN) 25 MG tablet Take 1 tablet (25 mg total) by mouth every 6 (six) hours as needed for nausea.  30 tablet  2  . QUEtiapine (SEROQUEL) 100 MG tablet Take 1 tablet (100 mg total) by mouth at bedtime.  90 tablet  3  . traZODone (DESYREL) 100 MG tablet Take 1 tablet (100 mg  total) by mouth at bedtime as needed. For sleep  90 tablet  1  . triamcinolone cream (KENALOG) 0.1 % APPLY  TO AFFECTED AREA TWICE DAILY  30 g  11  . furosemide (LASIX) 80 MG tablet Take 80 mg by mouth daily.        No facility-administered medications prior to visit.  Allergies  Allergen Reactions  . Cephalosporins Itching and Rash    Vanc and fortaz given at the same time in June for several doses at dialysis with systemic rash and itching; received zinacef 7/5 and had worseningsystemicrash/ itching and swelling of eyes - so unclear if allergic to either or both  . Nsaids     REACTION: renal dysfunction  . Pioglitazone     Actos REACTION: EDEMA  . Vancomycin     See comment under cephalosporin   Patient Active Problem List   Diagnosis Date Noted  . Hives 02/09/2013  . Thinning of skin 02/04/2013  . 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 Unspecified constipation 01/02/2013  . Muscle spasm 01/02/2013  . COPD (chronic obstructive pulmonary disease) 01/02/2013  . CKD (chronic kidney disease) 01/02/2013  . Cancer of kidney 10/07/2012  . Abdominal  pain, other specified site 10/07/2012  . Insomnia 10/07/2012  . Dysuria 01/24/2012  . N&V (nausea and vomiting) 01/24/2012  . End stage renal disease 09/19/2011  . Generalized weakness 08/17/2011  . Gait instability 08/17/2011  . UTI (lower urinary tract infection) 08/17/2011  . Renal failure (ARF), acute on chronic 08/17/2011  . Bilateral knee pain 04/20/2011  . 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 Chronic neck pain 12/13/2010  . Osteopenia 12/13/2010  . Preventative health care 12/12/2010  . HEART MURMUR, HX OF 10/02/2010  . Palpitations 09/08/2010  . HYPERSOMNIA 05/26/2010  . ANXIETY 03/23/2010  . Memory loss 01/24/2010  . BACK PAIN 12/12/2009  . ABDOMINAL PAIN -GENERALIZED 11/16/2009  . Personal history of colonic polyps 11/16/2009  . MENOPAUSAL DISORDER 05/25/2009  . SHOULDER PAIN, LEFT, CHRONIC 09/15/2008    . CERVICAL RADICULOPATHY, LEFT 05/27/2008  . Osteoarth NOS-L/Leg 03/11/2008  . 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 FATIGUE 09/17/2007  . Abnormality of gait 07/30/2007  . GOUT 05/29/2007  . ANEMIA-NOS 05/29/2007  . RESTLESS LEG SYNDROME 05/29/2007  . COMMON MIGRAINE 05/29/2007  . PERIPHERAL NEUROPATHY 05/29/2007  . Asthmatic bronchitis , chronic 05/29/2007  . PEPTIC ULCER DISEASE 05/29/2007  . HYPOTHYROIDISM 02/17/2007  . Impaired glucose tolerance 02/17/2007  . HYPERLIPIDEMIA 02/17/2007  . DEPRESSION 02/17/2007  . HYPERTENSION 02/17/2007  . PERIPHERAL VASCULAR DISEASE 02/17/2007  . ALLERGIC RHINITIS 02/17/2007  . GERD 02/17/2007  . RENAL INSUFFICIENCY 02/17/2007  . SEIZURE DISORDER 02/17/2007   History  Substance Use Topics  . Smoking status: Former Smoker -- 0.25 packs/day for 47 years    Types: Cigarettes  . Smokeless tobacco: Never Used     Comment: USING E CIG  . Alcohol Use: No   family history includes Coronary artery disease in her other; Dementia in her mother; Heart attack in her brother; Hyperlipidemia in her other; Hypertension in her brother, mother, other, and sister; Ovarian cancer in her other; Stroke in her brother and other.     Objective:   Physical Exam  well-developed elderly African American female in no acute distress mentation somewhat slow. Blood pressure 130/60 pulse 82 height 5 foot 4 weight 146. HEENT; nontraumatic normocephalic EOMI PERRLA sclera anicteric, Supple; no JVD, Cardiovascular; regular rate and rhythm with S1-S2, Pulmonary; clear bilaterally, Abdomen ;soft; nontender, nondistended, bowel sounds positive there is no succussion splash no palpable mass or hepatosplenomegaly no guarding or rebound she does have multiple incisional scars, Rectal; exam not done, Extremities ;no clubbing cyanosis or edema skin warm and dry, Psych; mentation slow but appropriate        Assessment & Plan:   #41  73 year old female with end-stage renal disease  on  dialysis on greater than 20 medications with complaints of intermittent sporadic vomiting. She also complains of nausea but says these episodes are not associated with the vomiting which seems to be spontaneous. Etiology is not clear suspect she may have an underlying gastroparesis. Doubt outlet obstruction or peptic ulcer disease but cannot rule out. #2 history of adenomatous colon polyps due for her five-year followup in 2016 #3 COPD #4 history of renal cell CA status post partial nephrectomy #5 diabetes mellitus #6 hypertension #7 restless leg syndrome #8 memory loss #9 seizure disorder  Plan; refill Zofran 4 mg. Patient is [name] [name] take one every morning to try to prevent episodes of nausea and vomiting Schedule for gastric emptying scan Schedule for EGD with Dr. Lyndee Leo was discussed in detail with the patient however prior to scheduling the procedures patient became more confused about whether or not she wanted to be seen here and thought she may go back to see Dr. Collene Mares. She wanted to hold on scheduling any of the procedures or x-rays She was also convinced that she was due for colonoscopy, but is not for at least another  year She stated she would call back early next week after she decides.

## 2013-09-18 NOTE — Progress Notes (Signed)
I agree with the plan above

## 2013-09-25 ENCOUNTER — Ambulatory Visit: Payer: Medicare Other | Admitting: Gastroenterology

## 2013-09-25 ENCOUNTER — Telehealth: Payer: Self-pay

## 2013-09-25 NOTE — Telephone Encounter (Signed)
The patient left message stating for the past 3 weeks is having BP problems while doing dialysis and headaches.  Has instructed records from dialysis sent to PCP.  Advise as patient stated would like to come in and discuss with PCP.  Call back number [phone]

## 2013-09-25 NOTE — Telephone Encounter (Signed)
[place] for OV at her convenience

## 2013-09-25 NOTE — Telephone Encounter (Signed)
Appt Wednesday afternoon 2/25.

## 2013-09-30 ENCOUNTER — Ambulatory Visit: Payer: Medicare Other | Admitting: Internal Medicine

## 2013-10-05 ENCOUNTER — Other Ambulatory Visit: Payer: Self-pay | Admitting: Adult Health

## 2013-10-05 ENCOUNTER — Other Ambulatory Visit: Payer: Self-pay | Admitting: Internal Medicine

## 2013-10-06 NOTE — Telephone Encounter (Signed)
Done hardcopy to robin  

## 2013-10-06 NOTE — Telephone Encounter (Signed)
Faxed hardcopy to [facility] [place]  

## 2013-10-14 ENCOUNTER — Encounter: Payer: Self-pay | Admitting: Internal Medicine

## 2013-10-14 ENCOUNTER — Telehealth: Payer: Self-pay | Admitting: Internal Medicine

## 2013-10-14 ENCOUNTER — Encounter: Payer: Medicare Other | Admitting: Internal Medicine

## 2013-10-14 ENCOUNTER — Other Ambulatory Visit (INDEPENDENT_AMBULATORY_CARE_PROVIDER_SITE_OTHER): Payer: Medicare Other

## 2013-10-14 ENCOUNTER — Ambulatory Visit (INDEPENDENT_AMBULATORY_CARE_PROVIDER_SITE_OTHER): Payer: Medicare Other | Admitting: Internal Medicine

## 2013-10-14 VITALS — BP 140/72 | HR 98 | Temp 98.6°F | Wt 146.2 lb

## 2013-10-14 DIAGNOSIS — E039 Hypothyroidism, unspecified: Secondary | ICD-10-CM

## 2013-10-14 DIAGNOSIS — E785 Hyperlipidemia, unspecified: Secondary | ICD-10-CM

## 2013-10-14 DIAGNOSIS — Z23 Encounter for immunization: Secondary | ICD-10-CM

## 2013-10-14 DIAGNOSIS — R7309 Other abnormal glucose: Secondary | ICD-10-CM

## 2013-10-14 DIAGNOSIS — Z0000 Encounter for general adult medical examination without abnormal findings: Secondary | ICD-10-CM

## 2013-10-14 DIAGNOSIS — R7302 Impaired glucose tolerance (oral): Secondary | ICD-10-CM

## 2013-10-14 DIAGNOSIS — I10 Essential (primary) hypertension: Secondary | ICD-10-CM

## 2013-10-14 DIAGNOSIS — F419 Anxiety disorder, unspecified: Principal | ICD-10-CM

## 2013-10-14 DIAGNOSIS — F329 Major depressive disorder, single episode, unspecified: Secondary | ICD-10-CM

## 2013-10-14 LAB — CBC WITH DIFFERENTIAL/PLATELET
Basophils Absolute: 0 10*3/uL (ref 0.0–0.1)
Basophils Relative: 0.3 % (ref 0.0–3.0)
EOS PCT: 2.2 % (ref 0.0–5.0)
Eosinophils Absolute: 0.2 10*3/uL (ref 0.0–0.7)
HCT: 38 % (ref 36.0–46.0)
Hemoglobin: 12.4 g/dL (ref 12.0–15.0)
LYMPHS PCT: 33.5 % (ref 12.0–46.0)
Lymphs Abs: 2.5 10*3/uL (ref 0.7–4.0)
MCHC: 32.7 g/dL (ref 30.0–36.0)
MCV: 99.4 fl (ref 78.0–100.0)
MONO ABS: 0.5 10*3/uL (ref 0.1–1.0)
Monocytes Relative: 6 % (ref 3.0–12.0)
NEUTROS PCT: 58 % (ref 43.0–77.0)
Neutro Abs: 4.4 10*3/uL (ref 1.4–7.7)
Platelets: 342 10*3/uL (ref 150.0–400.0)
RBC: 3.82 Mil/uL — AB (ref 3.87–5.11)
RDW: 15.7 % — ABNORMAL HIGH (ref 11.5–14.6)
WBC: 7.6 10*3/uL (ref 4.5–10.5)

## 2013-10-14 LAB — LIPID PANEL
CHOL/HDL RATIO: 2
Cholesterol: 149 mg/dL (ref 0–200)
HDL: 70.1 mg/dL (ref >39.00)
LDL CALC: 59 mg/dL (ref 0–99)
Triglycerides: 100 mg/dL (ref 0.0–149.0)
VLDL: 20 mg/dL (ref 0.0–40.0)

## 2013-10-14 LAB — BASIC METABOLIC PANEL
BUN: 39 mg/dL — AB (ref 6–23)
CALCIUM: 9.8 mg/dL (ref 8.4–10.5)
CHLORIDE: 98 meq/L (ref 96–112)
CO2: 29 mEq/L (ref 19–32)
CREATININE: 5.4 mg/dL — AB (ref 0.4–1.2)
GFR: 9.88 mL/min — CL (ref >60.00)
Glucose, Bld: 91 mg/dL (ref 70–99)
Potassium: 5.1 mEq/L (ref 3.5–5.1)
Sodium: 137 mEq/L (ref 135–145)

## 2013-10-14 LAB — HEPATIC FUNCTION PANEL
ALT: 17 U/L (ref 0–35)
AST: 18 U/L (ref 0–37)
Albumin: 4.2 g/dL (ref 3.5–5.2)
Alkaline Phosphatase: 71 U/L (ref 39–117)
BILIRUBIN TOTAL: 1.2 mg/dL (ref 0.3–1.2)
Bilirubin, Direct: 0.1 mg/dL (ref 0.0–0.3)
Total Protein: 7.6 g/dL (ref 6.0–8.3)

## 2013-10-14 LAB — HEMOGLOBIN A1C: Hgb A1c MFr Bld: 5.3 % (ref 4.6–6.5)

## 2013-10-14 LAB — TSH: TSH: 3.74 u[IU]/mL (ref 0.35–5.50)

## 2013-10-14 MED ORDER — ALBUTEROL SULFATE HFA 108 (90 BASE) MCG/ACT IN AERS: 2.0000 | INHALATION_SPRAY | Freq: Four times a day (QID) | RESPIRATORY_TRACT | Status: DC | PRN

## 2013-10-14 NOTE — Patient Instructions (Addendum)
You had the new Prevnar pneumonia shot today  Please continue all other medications as before, and refills have been done if requested. Please have the pharmacy call with any other refills you may need.  Please continue your efforts at being more active, low cholesterol diet, and weight control.  You will be contacted regarding the referral for: Dr Cameron/GI  You are otherwise up to date with prevention measures today.  Please go to the LAB in the Basement (turn left off the elevator) for the tests to be done today You will be contacted by phone if any changes need to be made immediately.  Otherwise, you will receive a letter about your results with an explanation, but please check with MyChart first.  To Log into My Chart online, please go by [facility] or [employer] to [email or web address], or download the MyChart App from the [employer] of [employer]. Your Username is: pumpkin (password: [phone])  Please return in 6 months, or sooner if needed

## 2013-10-14 NOTE — Progress Notes (Signed)
Subjective:    Patient ID: Rayonna L Sidell, female    DOB: 01-30-1941, 73 y.o.   MRN: [id number]  HPI  Here to f/u; overall doing ok,  Pt denies chest pain, increased sob or doe, wheezing, orthopnea, PND, increased LE swelling, palpitations, dizziness or syncope.  Pt denies polydipsia, polyuria, or low sugar symptoms such as weakness or confusion improved with po intake.  Pt denies new neurological symptoms such as new headache, or facial or extremity weakness or numbness.   Pt states overall good compliance with meds, has been trying to follow lower cholesterol diet, with wt overall stable,  but little exercise however. BP up and down somewhat related to dialysis it seems. Also with mild to mod 2-3 days ST, HA, general weakness and malaise and nonprod cough, resolved today.  Ask for f/u chol check, and referral to Dr Cameron/GI for colonoscopy.  Due for Prevnar. Denies hyper or hypo thyroid symptoms such as voice, skin or hair change. Past Medical History  Diagnosis Date  . HYPOTHYROIDISM 02/17/2007    s/p surgical removal of goiter in 1997  . DIABETES MELLITUS, TYPE II 02/17/2007  . HYPERLIPIDEMIA 02/17/2007  . GOUT 05/29/2007  . ANEMIA-NOS 05/29/2007  . ANXIETY 03/23/2010  . 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 DEPRESSION 02/17/2007  . RESTLESS LEG SYNDROME 05/29/2007  . COMMON MIGRAINE 05/29/2007  . PERIPHERAL NEUROPATHY 05/29/2007  . HYPERTENSION 02/17/2007  . PERIPHERAL VASCULAR DISEASE 02/17/2007  . PEPTIC ULCER DISEASE 05/29/2007  . Chronic kidney disease 02/17/2007    Followed by Dr. Florene Glen  . OSTEOPENIA 09/22/2009  . SEIZURE DISORDER 02/17/2007  . Memory loss 01/24/2010  . Personal history of colonic polyps 11/16/2009  . Palpitations 09/08/2010  . Other diseases of vocal cords 1996  . HEART MURMUR, HX OF 10/02/2010  . 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 Chronic neck pain 12/13/2010  . GERD (gastroesophageal reflux disease)   . 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 Cancer of kidney 10/07/2012    Followed per Dr Despina Pole, MD, urology, [place]   . Arthritis    Past Surgical History  Procedure Laterality Date  . Left toe amputated  2006  . Bunionectomy  1980  . Goiter removal  1997  . Stress cardiolite  06/18/2006  . Tranthoracic echocardiogram  06/18/2006  . Electrocardiogram  05/29/2006  . Cholecystectomy    . Rotator cuff repair  Left    Dr. Sharol Given  . Av fistula placement  03/13/2012    Procedure: ARTERIOVENOUS (AV) FISTULA CREATION;  Surgeon: Conrad Huntingdon, MD;  Location: [facility];  Service: Vascular;  Laterality: Right;  . Eye surgery      bilateral cataract removal  . Esophagoscopy w/ botox injection  07/22/2012    Procedure: ESOPHAGOSCOPY WITH BOTOX INJECTION;  Surgeon: Rozetta Nunnery, MD;  Location: [facility];  Service: ENT;  Laterality: N/A;  esophageal dilation  . Insertion of dialysis catheter N/A 02/05/2013    Procedure: INSERTION OF DIALYSIS CATHETER;  Surgeon: Angelia Mould, MD;  Location: [facility];  Service: Vascular;  Laterality: N/A;  Ultrasound guided    reports that she has quit smoking. Her smoking use included Cigarettes. She has a 11.75 pack-year smoking history. She has never used smokeless tobacco. She reports that she does not drink alcohol or use illicit drugs. family history includes Coronary artery disease in her other; Dementia in her mother; Heart attack in her brother;  Hyperlipidemia in her other; Hypertension in her brother, mother, other, and sister; Ovarian cancer in her other; Stroke in her brother and other. Allergies  Allergen Reactions  . Cephalosporins Itching and Rash    Vanc and fortaz given at the same time in June for several doses at dialysis with systemic rash and itching; received zinacef 7/5 and had worseningsystemicrash/ itching and swelling of eyes - so unclear if allergic to either or both  . Nsaids     REACTION: renal dysfunction  . Pioglitazone     Actos  REACTION: EDEMA  . Vancomycin     See comment under cephalosporin   Current Outpatient Prescriptions on File Prior to Visit  Medication Sig Dispense Refill  . allopurinol (ZYLOPRIM) 100 MG tablet Take 100 mg by mouth daily.       Marland Kitchen amLODipine (NORVASC) 5 MG tablet TAKE ONE TABLET BY MOUTH ONCE DAILY  90 tablet  3  . aspirin EC 81 MG tablet Take 81 mg by mouth daily.      Marland Kitchen atorvastatin (LIPITOR) 40 MG tablet Take 1 tablet (40 mg total) by mouth daily.  30 tablet  11  . benzonatate (TESSALON PERLES) 100 MG capsule 1-2 tabs by mouth every 8 hrs as needed for cough  60 capsule  1  . Blood Pressure Monitoring (BLOOD PRESSURE CUFF) MISC 1 application by Does not apply route daily.  1 each  0  . calcium carbonate (OS-CAL) 600 MG TABS Take 600 mg by mouth 2 (two) times daily with a meal.      . clonazePAM (KLONOPIN) 2 MG tablet TAKE ONE TABLET BY MOUTH TWICE DAILY AS NEEDED  60 tablet  2  . cyclobenzaprine (FLEXERIL) 10 MG tablet Take 10 mg by mouth 3 (three) times daily as needed. For muscle spasms      . cyproheptadine (PERIACTIN) 4 MG tablet Take 1 tablet (4 mg total) by mouth 2 (two) times daily.  60 tablet  3  . dextromethorphan-guaiFENesin (MUCINEX DM) 30-600 MG per 12 hr tablet Take 1 tablet by mouth every 12 (twelve) hours.      . docusate sodium (COLACE) 100 MG capsule Take 100 mg by mouth 2 (two) times daily as needed. For constipation      . DULoxetine (CYMBALTA) 60 MG capsule Take 60 mg by mouth daily.      Marland Kitchen HYDROcodone-acetaminophen (NORCO/VICODIN) 5-325 MG per tablet Take 1 tablet by mouth every 6 (six) hours as needed for pain. To fill Aug 07, 2013  120 tablet  0  . hydrOXYzine (ATARAX/VISTARIL) 25 MG tablet TAKE TWO TABLETS BY MOUTH EVERY 6 HOURS AS NEEDED FOR NERVES.  60 tablet  2  . lactulose (CHRONULAC) 10 GM/15ML solution TAKE 30 MLS BY MOUTH THREE  TIMES A DAY  240 mL  11  . levothyroxine (SYNTHROID, LEVOTHROID) 125 MCG tablet TAKE ONE TABLET BY MOUTH EVERY DAY  90 tablet  3  .  mometasone-formoterol (DULERA) 100-5 MCG/ACT AERO Inhale 2 puffs into the lungs 2 (two) times daily.  1 Inhaler  6  . montelukast (SINGULAIR) 10 MG tablet Take 1 tablet (10 mg total) by mouth at bedtime.  30 tablet  11  . multivitamin (RENA-VIT) TABS tablet Take 1 tablet by mouth daily.      Marland Kitchen omeprazole (PRILOSEC) 40 MG capsule Take 40 mg by mouth daily.       . ondansetron (ZOFRAN) 4 MG tablet Take 1 tablet (4 mg total) by mouth every 8 (eight) hours as  needed for nausea.  40 tablet  1  . promethazine (PHENERGAN) 25 MG tablet Take 1 tablet (25 mg total) by mouth every 6 (six) hours as needed for nausea.  30 tablet  2  . traZODone (DESYREL) 100 MG tablet Take 1 tablet (100 mg total) by mouth at bedtime as needed. For sleep  90 tablet  1  . triamcinolone cream (KENALOG) 0.1 % APPLY  TO AFFECTED AREA TWICE DAILY  30 g  11   No current facility-administered medications on file prior to visit.   Review of Systems  Constitutional: Negative for unexpected weight change, or unusual diaphoresis  HENT: Negative for tinnitus.   Eyes: Negative for photophobia and visual disturbance.  Respiratory: Negative for choking and stridor.   Gastrointestinal: Negative for vomiting and blood in stool.  Genitourinary: Negative for hematuria and decreased urine volume.  Musculoskeletal: Negative for acute joint swelling Skin: Negative for color change and wound.  Neurological: Negative for tremors and numbness other than noted  Psychiatric/Behavioral: Negative for decreased concentration or  hyperactivity.       Objective:   Physical Exam BP 140/72  Pulse 98  Temp(Src) 98.6 F (37 C) (Oral)  Wt 146 lb 4 oz (66.339 kg)  SpO2 96% VS noted, not ill appearing Constitutional: Pt appears well-developed and well-nourished.  HENT: Head: NCAT.  Right Ear: External ear normal.  Left Ear: External ear normal.  Eyes: Conjunctivae and EOM are normal. Pupils are equal, round, and reactive to light.  Neck: Normal  range of motion. Neck supple.  Cardiovascular: Normal rate and regular rhythm.   Pulmonary/Chest: Effort normal and breath sounds normal.  Abd:  Soft, NT, non-distended, + BS Neurological: Pt is alert. Not confused  Skin: Skin is warm. No erythema.  Psychiatric: Pt behavior is normal. Thought content normal.     Assessment & Plan:

## 2013-10-14 NOTE — Progress Notes (Signed)
Pre visit review using our clinic review tool, if applicable. No additional management support is needed unless otherwise documented below in the visit note. 

## 2013-10-14 NOTE — Telephone Encounter (Signed)
Done per emr 

## 2013-10-14 NOTE — Telephone Encounter (Signed)
Message copied by Biagio Borg on Wed Oct 14, 2013  6:50 PM ------      Message from: Sharon Seller B      Created: Wed Oct 14, 2013  3:22 PM       The patient would like a referral to [facility] on [address]  Her mom's health is getting worse and her own as well.  ------

## 2013-10-15 ENCOUNTER — Other Ambulatory Visit: Payer: Self-pay | Admitting: Internal Medicine

## 2013-10-15 NOTE — Telephone Encounter (Signed)
Done erx 

## 2013-10-17 NOTE — Assessment & Plan Note (Signed)
stable overall by history and exam, recent data reviewed with pt, and pt to continue medical treatment as before,  to f/u any worsening symptoms or concerns Lab Results  Component Value Date   HGBA1C 5.3 10/14/2013

## 2013-10-17 NOTE — Assessment & Plan Note (Signed)
stable overall by history and exam, recent data reviewed with pt, and pt to continue medical treatment as before,  to f/u any worsening symptoms or concerns Lab Results  Component Value Date   LDLCALC 59 10/14/2013

## 2013-10-17 NOTE — Assessment & Plan Note (Signed)
Rake control for now, cont meds as is BP Readings from Last 3 Encounters:  10/14/13 140/72  09/18/13 138/60  08/26/13 169/93

## 2013-10-17 NOTE — Assessment & Plan Note (Signed)
stable overall by history and exam, recent data reviewed with pt, and pt to continue medical treatment as before,  to f/u any worsening symptoms or concerns Lab Results  Component Value Date   TSH 3.74 10/14/2013

## 2013-10-22 ENCOUNTER — Telehealth: Payer: Self-pay | Admitting: *Deleted

## 2013-10-22 MED ORDER — AZITHROMYCIN 250 MG PO TABS: ORAL_TABLET | ORAL | Status: DC

## 2013-10-22 NOTE — Telephone Encounter (Signed)
Done erx 

## 2013-10-22 NOTE — Telephone Encounter (Signed)
Pt called requesting an Rx been sent to Pharmacy for Bronchitis state she still has a cough.  Please advise

## 2013-10-23 ENCOUNTER — Telehealth: Payer: Self-pay

## 2013-10-23 MED ORDER — PROMETHAZINE HCL 25 MG PO TABS: 25.0000 mg | ORAL_TABLET | Freq: Four times a day (QID) | ORAL | Status: DC | PRN

## 2013-10-23 NOTE — Telephone Encounter (Signed)
Ok to change back to the phenergan prn - done erx

## 2013-10-23 NOTE — Telephone Encounter (Signed)
Left detailed message rx done

## 2013-10-23 NOTE — Telephone Encounter (Signed)
Phone call from patient [phone]. She states Ondansetron 4 mg needs prior authorization through her first health insurance.

## 2013-10-26 NOTE — Telephone Encounter (Signed)
Called left message to call back 

## 2013-10-26 NOTE — Telephone Encounter (Signed)
Called the patient and she states the phenergan does not work for her as she has been on for so many years.  Informed would do PA for odansetron

## 2013-11-02 ENCOUNTER — Other Ambulatory Visit: Payer: Self-pay | Admitting: Adult Health

## 2013-11-02 ENCOUNTER — Telehealth: Payer: Self-pay | Admitting: *Deleted

## 2013-11-02 ENCOUNTER — Other Ambulatory Visit: Payer: Self-pay | Admitting: Internal Medicine

## 2013-11-02 NOTE — Telephone Encounter (Signed)
Pt called states her insurance company is requesting Meclizine is appropriate for her to take instead of the [place].  Please advise

## 2013-11-02 NOTE — Telephone Encounter (Signed)
No sorry this is not.

## 2013-11-03 NOTE — Telephone Encounter (Signed)
Spoke with the pt advised of MDs message.

## 2013-11-03 NOTE — Telephone Encounter (Signed)
Both done erx

## 2013-11-20 ENCOUNTER — Ambulatory Visit: Payer: Medicare Other | Admitting: Pulmonary Disease

## 2013-12-03 ENCOUNTER — Telehealth: Payer: Self-pay | Admitting: *Deleted

## 2013-12-03 NOTE — Telephone Encounter (Signed)
Pt called requesting Cymbalta refill.  Please advise

## 2013-12-03 NOTE — Telephone Encounter (Signed)
Spoke with pt advised of MDs message 

## 2013-12-03 NOTE — Telephone Encounter (Signed)
I would feel uncomfortable with this, as I believe she sees psychiatry and they manage her medications like cymbalta.  Let me know if this is not correct

## 2013-12-08 ENCOUNTER — Other Ambulatory Visit: Payer: Self-pay | Admitting: Internal Medicine

## 2013-12-09 ENCOUNTER — Encounter: Payer: Self-pay | Admitting: Internal Medicine

## 2013-12-09 ENCOUNTER — Other Ambulatory Visit: Payer: Self-pay | Admitting: Adult Health

## 2013-12-09 ENCOUNTER — Ambulatory Visit (INDEPENDENT_AMBULATORY_CARE_PROVIDER_SITE_OTHER): Payer: Medicare Other | Admitting: Internal Medicine

## 2013-12-09 VITALS — BP 110/72 | HR 92 | Temp 98.6°F | Wt 145.5 lb

## 2013-12-09 DIAGNOSIS — I10 Essential (primary) hypertension: Secondary | ICD-10-CM

## 2013-12-09 DIAGNOSIS — G629 Polyneuropathy, unspecified: Secondary | ICD-10-CM

## 2013-12-09 DIAGNOSIS — J4489 Other specified chronic obstructive pulmonary disease: Secondary | ICD-10-CM

## 2013-12-09 DIAGNOSIS — G609 Hereditary and idiopathic neuropathy, unspecified: Secondary | ICD-10-CM

## 2013-12-09 DIAGNOSIS — J449 Chronic obstructive pulmonary disease, unspecified: Secondary | ICD-10-CM

## 2013-12-09 MED ORDER — DULOXETINE HCL 30 MG PO CPEP: 30.0000 mg | ORAL_CAPSULE | Freq: Every day | ORAL | Status: DC

## 2013-12-09 NOTE — Progress Notes (Signed)
Pre visit review using our clinic review tool, if applicable. No additional management support is needed unless otherwise documented below in the visit note. 

## 2013-12-09 NOTE — Patient Instructions (Addendum)
Ok for cymbalta 30 mg  In the AM, and to stop the trazodone  Please call in 1-2 wks if you are tolerating the cymbalta 30 mg ok, and want to consider increasing to 60 mg  Please continue all other medications as before, including the seroquel in the PM  Please have the pharmacy call with any other refills you may need.  Your blood work from march 2015 was OK  Please keep your appointments with your specialists as you have planned  You should ask about counseling available with your mother's hospice.  Please return in 6 months, or sooner if needed

## 2013-12-09 NOTE — Progress Notes (Signed)
Subjective:    Patient ID: [name] [name], female    DOB: 21-Dec-1940, 73 y.o.   MRN: [id number]  HPI  Here to f/u; overall doing ok,  Pt denies chest pain, increased sob or doe, wheezing, orthopnea, PND, increased LE swelling, palpitations, dizziness or syncope.  Pt denies polydipsia, polyuria, or low sugar symptoms such as weakness or confusion improved with po intake.  Pt denies new neurological symptoms such as new headache, or facial or extremity weakness or numbness.   Pt states overall good compliance with meds, has been trying to follow lower cholesterol diet, with wt overall stable,  but little exercise however.  Asks for cymbalta for neuropathic pain purpose.  Tried lyrica per renal, but referred her for cymbalta since this is not a controlled substance.  Slept too much on lyrica in the past.  Already on seroquel, referred previously to psychiatry but did not see..  Did take some leftover trazodone for sleep last night.   Mother is actively dying per pt, under hospice care at home. Past Medical History  Diagnosis Date  . HYPOTHYROIDISM 02/17/2007    s/p surgical removal of goiter in 1997  . DIABETES MELLITUS, TYPE II 02/17/2007  . HYPERLIPIDEMIA 02/17/2007  . GOUT 05/29/2007  . ANEMIA-NOS 05/29/2007  . ANXIETY 03/23/2010  . 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 DEPRESSION 02/17/2007  . RESTLESS LEG SYNDROME 05/29/2007  . COMMON MIGRAINE 05/29/2007  . PERIPHERAL NEUROPATHY 05/29/2007  . HYPERTENSION 02/17/2007  . PERIPHERAL VASCULAR DISEASE 02/17/2007  . PEPTIC ULCER DISEASE 05/29/2007  . Chronic kidney disease 02/17/2007    Followed by Dr. Florene Glen  . OSTEOPENIA 09/22/2009  . SEIZURE DISORDER 02/17/2007  . Memory loss 01/24/2010  . Personal history of colonic polyps 11/16/2009  . Palpitations 09/08/2010  . Other diseases of vocal cords 1996  . HEART MURMUR, HX OF 10/02/2010  . 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 Chronic neck pain 12/13/2010  . GERD (gastroesophageal reflux disease)   . 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 Cancer of kidney 10/07/2012    Followed per Dr Despina Pole, MD, urology, [place]   . Arthritis    Past Surgical History  Procedure Laterality Date  . Left toe amputated  2006  . Bunionectomy  1980  . Goiter removal  1997  . Stress cardiolite  06/18/2006  . Tranthoracic echocardiogram  06/18/2006  . Electrocardiogram  05/29/2006  . Cholecystectomy    . Rotator cuff repair  Left    Dr. Sharol Given  . Av fistula placement  03/13/2012    Procedure: ARTERIOVENOUS (AV) FISTULA CREATION;  Surgeon: Conrad Inez, MD;  Location: [facility];  Service: Vascular;  Laterality: Right;  . Eye surgery      bilateral cataract removal  . Esophagoscopy w/ botox injection  07/22/2012    Procedure: ESOPHAGOSCOPY WITH BOTOX INJECTION;  Surgeon: Rozetta Nunnery, MD;  Location: [facility];  Service: ENT;  Laterality: N/A;  esophageal dilation  . Insertion of dialysis catheter N/A 02/05/2013    Procedure: INSERTION OF DIALYSIS CATHETER;  Surgeon: Angelia Mould, MD;  Location: [facility];  Service: Vascular;  Laterality: N/A;  Ultrasound guided    reports that she has quit smoking. Her smoking use included Cigarettes. She has a 11.75 pack-year smoking history. She has never used smokeless tobacco. She reports that she does not drink alcohol or use illicit drugs. family history includes Coronary artery disease in her  other; Dementia in her mother; Heart attack in her brother; Hyperlipidemia in her other; Hypertension in her brother, mother, other, and sister; Ovarian cancer in her other; Stroke in her brother and other. Allergies  Allergen Reactions  . Cephalosporins Itching and Rash    Vanc and fortaz given at the same time in June for several doses at dialysis with systemic rash and itching; received zinacef 7/5 and had worseningsystemicrash/ itching and swelling of eyes - so unclear if allergic to either or both  . Nsaids      REACTION: renal dysfunction  . Pioglitazone     Actos REACTION: EDEMA  . Vancomycin     See comment under cephalosporin   Current Outpatient Prescriptions on File Prior to Visit  Medication Sig Dispense Refill  . albuterol (PROVENTIL HFA;VENTOLIN HFA) 108 (90 BASE) MCG/ACT inhaler Inhale 2 puffs into the lungs every 6 (six) hours as needed for wheezing or shortness of breath.  1 Inhaler  11  . allopurinol (ZYLOPRIM) 100 MG tablet Take 100 mg by mouth daily.       Marland Kitchen amLODipine (NORVASC) 5 MG tablet TAKE ONE TABLET BY MOUTH ONCE DAILY  90 tablet  3  . aspirin EC 81 MG tablet Take 81 mg by mouth daily.      Marland Kitchen atorvastatin (LIPITOR) 40 MG tablet Take 1 tablet (40 mg total) by mouth daily.  30 tablet  11  . benzonatate (TESSALON PERLES) 100 MG capsule 1-2 tabs by mouth every 8 hrs as needed for cough  60 capsule  1  . Blood Pressure Monitoring (BLOOD PRESSURE CUFF) MISC 1 application by Does not apply route daily.  1 each  0  . calcium carbonate (OS-CAL) 600 MG TABS Take 600 mg by mouth 2 (two) times daily with a meal.      . clonazePAM (KLONOPIN) 2 MG tablet TAKE ONE TABLET BY MOUTH TWICE DAILY AS NEEDED  60 tablet  2  . cyclobenzaprine (FLEXERIL) 10 MG tablet Take 10 mg by mouth 3 (three) times daily as needed. For muscle spasms      . cyproheptadine (PERIACTIN) 4 MG tablet Take 1 tablet (4 mg total) by mouth 2 (two) times daily.  60 tablet  3  . dextromethorphan-guaiFENesin (MUCINEX DM) 30-600 MG per 12 hr tablet Take 1 tablet by mouth every 12 (twelve) hours.      . docusate sodium (COLACE) 100 MG capsule Take 100 mg by mouth 2 (two) times daily as needed. For constipation      . HYDROcodone-acetaminophen (NORCO/VICODIN) 5-325 MG per tablet Take 1 tablet by mouth every 6 (six) hours as needed for pain. To fill Aug 07, 2013  120 tablet  0  . hydrOXYzine (ATARAX/VISTARIL) 25 MG tablet TAKE TWO TABLETS BY MOUTH EVERY 6 HOURS AS NEEDED FOR NERVES.  60 tablet  2  . lactulose (CHRONULAC) 10 GM/15ML  solution TAKE 30 MLS BY MOUTH THREE  TIMES A DAY  240 mL  11  . levothyroxine (SYNTHROID, LEVOTHROID) 125 MCG tablet TAKE ONE TABLET BY MOUTH EVERY DAY  90 tablet  3  . mometasone-formoterol (DULERA) 100-5 MCG/ACT AERO Inhale 2 puffs into the lungs 2 (two) times daily.  1 Inhaler  6  . montelukast (SINGULAIR) 10 MG tablet Take 1 tablet (10 mg total) by mouth at bedtime.  30 tablet  11  . multivitamin (RENA-VIT) TABS tablet Take 1 tablet by mouth daily.      Marland Kitchen omeprazole (PRILOSEC) 40 MG capsule Take 40 mg by  mouth daily.       . promethazine (PHENERGAN) 25 MG tablet Take 1 tablet (25 mg total) by mouth every 6 (six) hours as needed for nausea.  30 tablet  2  . QUEtiapine (SEROQUEL) 100 MG tablet TAKE ONE TABLET BY MOUTH AT BEDTIME  90 tablet  0  . triamcinolone cream (KENALOG) 0.1 % APPLY  TO AFFECTED AREA TWICE DAILY  30 g  11   No current facility-administered medications on file prior to visit.     Review of Systems  Constitutional: Negative for unusual diaphoresis or other sweats  HENT: Negative for ringing in ear Eyes: Negative for double vision or worsening visual disturbance.  Respiratory: Negative for choking and stridor.   Gastrointestinal: Negative for vomiting or other signifcant bowel change Genitourinary: Negative for hematuria or decreased urine volume.  Musculoskeletal: Negative for other MSK pain or swelling Skin: Negative for color change and worsening wound.  Neurological: Negative for tremors and numbness other than noted  Psychiatric/Behavioral: Negative for decreased concentration or agitation other than above       Objective:   Physical Exam BP 110/72  Pulse 92  Temp(Src) 98.6 F (37 C) (Oral)  Wt 145 lb 8 oz (65.998 kg)  SpO2 92% VS noted,  Constitutional: Pt appears well-developed, well-nourished.  HENT: Head: NCAT.  Right Ear: External ear normal.  Left Ear: External ear normal.  Eyes: . Pupils are equal, round, and reactive to light. Conjunctivae and  EOM are normal Neck: Normal range of motion. Neck supple.  Cardiovascular: Normal rate and regular rhythm.   Pulmonary/Chest: Effort normal and breath sounds decreased, no rales or wheezing.  Abd:  Soft, NT, ND, + BS Neurological: Pt is alert.  , motor grossly intact Skin: Skin is warm. No rash Psychiatric: Pt behavior is normal. No agitation. mild nervous    Assessment & Plan:

## 2013-12-12 DIAGNOSIS — G629 Polyneuropathy, unspecified: Secondary | ICD-10-CM | POA: Insufficient documentation

## 2013-12-12 NOTE — Assessment & Plan Note (Signed)
stable overall by history and exam, recent data reviewed with pt, and pt to continue medical treatment as before,  to f/u any worsening symptoms or concerns BP Readings from Last 3 Encounters:  12/09/13 110/72  10/14/13 140/72  09/18/13 138/60

## 2013-12-12 NOTE — Assessment & Plan Note (Signed)
For cymbalta asd,  to f/u any worsening symptoms or concerns

## 2013-12-12 NOTE — Assessment & Plan Note (Signed)
stable overall by history and exam, recent data reviewed with pt, and pt to continue medical treatment as before,  to f/u any worsening symptoms or concerns SpO2 Readings from Last 3 Encounters:  12/09/13 92%  10/14/13 96%  08/26/13 99%

## 2013-12-14 ENCOUNTER — Ambulatory Visit: Payer: Medicare Other | Admitting: Podiatry

## 2014-01-03 ENCOUNTER — Encounter: Payer: Self-pay | Admitting: Gastroenterology

## 2014-01-06 ENCOUNTER — Telehealth: Payer: Self-pay | Admitting: *Deleted

## 2014-01-06 MED ORDER — CLONAZEPAM 2 MG PO TABS: ORAL_TABLET | ORAL | Status: DC

## 2014-01-06 NOTE — Telephone Encounter (Signed)
Pt called states her mother passed away in [place] and is requesting Clonazepam Rx refill.  Please advise

## 2014-01-06 NOTE — Telephone Encounter (Signed)
Called the patient to inform request has been done.  Faxed hardcopy to [facility], [place]

## 2014-01-06 NOTE — Telephone Encounter (Signed)
Done hardcopy to robin  

## 2014-01-12 ENCOUNTER — Other Ambulatory Visit: Payer: Self-pay

## 2014-01-12 MED ORDER — HYDROXYZINE HCL 25 MG PO TABS: 25.0000 mg | ORAL_TABLET | Freq: Four times a day (QID) | ORAL | Status: DC | PRN

## 2014-01-12 NOTE — Telephone Encounter (Signed)
Called the patient back to find out the pharmacy name and location in [place] to send rx

## 2014-01-25 ENCOUNTER — Telehealth: Payer: Self-pay | Admitting: Internal Medicine

## 2014-01-25 DIAGNOSIS — M5489 Other dorsalgia: Secondary | ICD-10-CM

## 2014-01-25 NOTE — Telephone Encounter (Signed)
Ms. Demma in in [place] due to the passing of her mother.  She needs a referral to an Ortho Doctor in [place] for shoulder pain under her shoulder blade.  The doctor is Dr. Deirdre Peer  His phone number is [phone]  Fax is [phone].

## 2014-02-01 ENCOUNTER — Other Ambulatory Visit: Payer: Self-pay | Admitting: Internal Medicine

## 2014-02-08 ENCOUNTER — Other Ambulatory Visit: Payer: Self-pay | Admitting: Internal Medicine

## 2014-02-09 NOTE — Telephone Encounter (Signed)
klonopin too soon, just done June 3  levothyrox refilled

## 2014-02-09 NOTE — Telephone Encounter (Signed)
Called the patient to inform  No answer and no voice mail to leave a message.

## 2014-02-10 ENCOUNTER — Other Ambulatory Visit: Payer: Self-pay | Admitting: Internal Medicine

## 2014-02-23 ENCOUNTER — Other Ambulatory Visit: Payer: Self-pay | Admitting: Internal Medicine

## 2014-02-24 ENCOUNTER — Telehealth: Payer: Self-pay | Admitting: *Deleted

## 2014-02-24 NOTE — Telephone Encounter (Signed)
Pt call back she stated she called [facility] they require $100 upfront then they will [facility], which they inform her they did not accept her insurance. Requesting md to send her to someone who accept her insurance...Johny Chess

## 2014-02-24 NOTE — Telephone Encounter (Signed)
I can forward to [facility] who may be able to help

## 2014-02-24 NOTE — Telephone Encounter (Signed)
Pt is requesting md to go ahead and set up referral. She states her mom did pass, she is still grieving, discuss with md at her last ov. Would like to go back to [facility] off of [address]...Johny Chess

## 2014-02-26 ENCOUNTER — Ambulatory Visit (INDEPENDENT_AMBULATORY_CARE_PROVIDER_SITE_OTHER): Payer: Medicare Other | Admitting: Podiatrist

## 2014-02-26 ENCOUNTER — Encounter: Payer: Self-pay | Admitting: Podiatrist

## 2014-02-26 VITALS — BP 144/84 | HR 74 | Resp 17

## 2014-02-26 DIAGNOSIS — L97513 Non-pressure chronic ulcer of other part of right foot with necrosis of muscle: Secondary | ICD-10-CM

## 2014-02-26 DIAGNOSIS — L97509 Non-pressure chronic ulcer of other part of unspecified foot with unspecified severity: Secondary | ICD-10-CM

## 2014-02-26 MED ORDER — CLINDAMYCIN HCL 300 MG PO CAPS: 300.0000 mg | ORAL_CAPSULE | Freq: Three times a day (TID) | ORAL | Status: DC

## 2014-02-26 MED ORDER — CIPROFLOXACIN HCL 500 MG PO TABS: 500.0000 mg | ORAL_TABLET | Freq: Two times a day (BID) | ORAL | Status: DC

## 2014-02-26 MED ORDER — MUPIROCIN 2 % EX OINT: 1.0000 "application " | TOPICAL_OINTMENT | Freq: Two times a day (BID) | CUTANEOUS | Status: DC

## 2014-02-26 NOTE — Progress Notes (Signed)
   Subjective:    Patient ID: [name] [name], female    DOB: 03/15/1941, 73 y.o.   MRN: [id number]  HPI  Pt presents with diabetic ulcer on right foot 2nd met, noted yellow/white drainage, swollen and discolored. She has been using mupirocin for 2 days with bandage to cover.   Review of Systems     Objective:   Physical Exam Patient awake, oriented x 3.  Vascular status intact but decreased with dp pulses palpable at 1/4 and pt 1/4 bilateral.  Neurological sensation decreased at 2/5 sited bilateral.  Significant swelling is present on the right 2nd toe.  Darkening and discoloration is present with a ulcer present at the proximal interphalangeal joint.  Concern for probing to bone is present.  No pus or pirulence is expressed. 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       Assessment & Plan:  Ulceration right 2nd toe  Plan:  Concern is present for osteomyelitis.  i started her on antibiotics and an iodosorb dressing was applied.  She was given instructions for aftercare.  She did not have time to wait and get xrays done as she had to catch a bus so we will take xrays at the next visit.  I did discuss the toe itself and the fact that an amputation may be required.

## 2014-03-01 ENCOUNTER — Telehealth: Payer: Self-pay | Admitting: *Deleted

## 2014-03-01 ENCOUNTER — Other Ambulatory Visit: Payer: Self-pay | Admitting: Internal Medicine

## 2014-03-01 NOTE — Telephone Encounter (Signed)
I'm supposed to chang my dressing today.  Do I soak my black toe?  I told her no, do not soak it.  I told her she can gently cleanse it with a mild soapy wash cloth.  She stated then use that ointment she prescribed.  I told her yes.  She stated okay thank you.

## 2014-03-01 NOTE — Telephone Encounter (Signed)
I'm supposed to change the dressing on my foot.  What am I supposed to use to clean it?  I hope I get a call this morning because I need to change dressing today.

## 2014-03-05 ENCOUNTER — Ambulatory Visit (INDEPENDENT_AMBULATORY_CARE_PROVIDER_SITE_OTHER): Payer: Medicare Other | Admitting: Podiatrist

## 2014-03-05 ENCOUNTER — Ambulatory Visit (INDEPENDENT_AMBULATORY_CARE_PROVIDER_SITE_OTHER): Payer: Medicare Other

## 2014-03-05 VITALS — BP 125/60 | HR 67 | Temp 96.5°F | Resp 17 | Ht 67.5 in | Wt 145.0 lb

## 2014-03-05 DIAGNOSIS — I739 Peripheral vascular disease, unspecified: Secondary | ICD-10-CM

## 2014-03-05 DIAGNOSIS — I96 Gangrene, not elsewhere classified: Secondary | ICD-10-CM

## 2014-03-05 DIAGNOSIS — L97509 Non-pressure chronic ulcer of other part of unspecified foot with unspecified severity: Secondary | ICD-10-CM

## 2014-03-05 MED ORDER — HYDROCODONE-ACETAMINOPHEN 5-325 MG PO TABS: 1.0000 | ORAL_TABLET | ORAL | Status: DC | PRN

## 2014-03-05 MED ORDER — MUPIROCIN 2 % EX OINT: 1.0000 "application " | TOPICAL_OINTMENT | Freq: Two times a day (BID) | CUTANEOUS | Status: DC

## 2014-03-05 NOTE — Progress Notes (Signed)
Subjective: Patient presents today for her one week followup status post darkening discoloration of the right second toe with gangrenous changes present. She has been dressing the toe and has a tight dressing on the toe at today's visit. She states she's had pain in the toe.  Physical Exam  Patient awake, oriented x 3. Vascular status decreased with nonpalpable pulses right at today's visit.Marland Kitchen Neurological sensation decreased at 2/5 sited bilateral. Significant swelling is present on the right 2nd toe. Darkening and discoloration is present with a ulcer present at the proximal interphalangeal joint. This is unchanged from last visit. 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  X-rays are taken and no obvious degenerative bony changes are noted on the right second toe   Assessment: Ulceration with gangrenous changes right second toe, decreased pulses right   Plan: Applied Iodosorb over the ulceration and wrapped a piece of gauze and paper tape on the toe and gave her instructions for continued use. Instructed her not to wrap the toe tightly and digits his paper tape tear. Discussed the high probability that she would lose the toe due to gangrene and poor circulation to the toe similar to that which happen on the left foot. She would like to try and save the toe therefore recommended a circulation study and consult with Dr. Alvester Chou to see if this is possible. I will see her back in one week for followup.

## 2014-03-05 NOTE — Patient Instructions (Addendum)
Continue to put a dressing on the toe put tape on the toe very lightly.  I will call in a topical antibiotic to your pharmacy to use on the toe.      Gangrene Gangrene is the decay or death of an organ or tissue caused by loss of blood supply. It may be caused by infectious or inflammatory processes. Or it may be caused by degenerative changes in long-standing diseases such as diabetes. It may also occur following injuries or surgical procedures. TYPES OF GANGRENE There are three major types of gangrene: dry, moist, and gaseous (a type of moist gangrene).  Dry gangrene is a condition that results when one or more arteries become blocked. Arteries are muscular vessels which carry blood away from the heart and around the body. In this type of gangrene, the tissue slowly dies, due to loss of blood supply. But it does not become infected. The affected area becomes cold and black. It begins to dry out and wither. Eventually it drops off over a period of weeks or months. Dry gangrene is most common in people with advanced blockages of the arteries (arteriosclerosis or hardening of the arteries) resulting from diabetes.  Moist gangrene occurs in the toes, feet, or legs after a crushing injury. Or it can be a result of some other problem that causes blood flow in an area to suddenly stop. When blood flow stops, bacteria begin to invade the muscle and grow. It multiplies quickly, without the body's immune system stopping it.  Gaseous gangrene is also called muscle death (myonecrosis). It is a type of moist gangrene commonly caused by a bacterial infection. This is usually caused by a bacteria which can live in an area of little or no oxygen. Once present in tissue, these bacteria produce gases and poisonous toxins as they grow. These bacteria are normally found in the gut, respiratory, and female urinary tract. They often infect thigh amputation wounds commonly in people who have lost control of their bowel  functions (incontinence). Gangrene, incontinence, and weakness often are found in patients with diabetes. It is in the amputation stump of diabetic patients that gaseous gangrene often happens. The most common bacteria to cause gaseous gangrene is clostridia. Other bacteria which cause moist gangrene include streptococcus and staphylococcus. A serious, but rare form of infection with group A streptococci can slow blood flow. If untreated, it can progress to gangrene. This is more commonly called necrotizing fasciitis. This is an infection of the skin and tissues directly beneath the skin. This is sometimes called the "flesh-eating bacteria". It is called this because it can rapidly kill large areas of flesh in the body. It requires immediate surgical treatment or it will result in death. CAUSES   Some illnesses can cause gangrene because the vessels carrying the blood are already diseased. Examples are:  Long-standing illnesses, such as diabetes mellitus or arteriosclerosis.  Other diseases affecting the blood vessels, such as Buerger disease or Raynaud disease. Other causes of gangrene include:   Open bone breaks.  Burns.  Injections given under the skin or in a muscle.  Gangrene may occur following surgery, particularly in individuals with diabetes or other long-term (chronic) disease.  Gas gangrene can be also a complication of dry gangrene. Or it can happen suddenly along with an underlying cancer. Moist gangrene and gas gangrene are different. Gas gangrene usually involves only the muscle. It does not involve the skin as much. In moist or gas gangrene, there is a feeling of heaviness  in the affected area that is followed by severe pain. The pain is caused by swelling from fluid or gas accumulation in the tissues. This pain is the worst, on average, between 1 and 4 days following the injury. It can range from several hours to several weeks. The swollen skin may at first be blistered, red, and  warm to the touch. Then it changes to a bronze, brown, or black color. In the majority of cases, the affected and surrounding tissues may give off crackling sounds (crepitus). This is a result of gas bubbles accumulating under the skin. The gas may be felt beneath the skin. In wet gangrene, the pus is foul-smelling. In gas gangrene, there is no true pus, just an almost "sweet" smelling watery discharge. If the bacterial toxins spread to the bloodstream, the following may happen:  A higher than normal temperature.  Fast heart rate and breathing.  Changed mental state.  Loss of appetite.  Diarrhea.  Vomiting.  Shock. SYMPTOMS  Areas of either dry or moist gangrene are first noticed as a red line on the skin that marks the border of the affected tissues.  As tissues begin to die, dry gangrene may cause pain in the early stages. Or it may go unnoticed.  This happens especially in the elderly or in individuals with decreased ability to feel pain.  At first, the area becomes cold, numb, and pale. Later its color changes to brown, then black. This dead tissue will gradually separate from the healthy tissue and fall off. DIAGNOSIS  Diagnosis of gangrene is based on patient history, physical examination, and results of blood and other lab tests.  A caregiver will look for a history of:  Recent damage caused by an accident (trauma).  Surgery.  Cancer.  Chronic disease.  Blood tests can be used to find out if infection is present and find how much it has spread.  Drainage from a wound, or a tissue sample obtained through surgical exploration, may be cultured to:  Identify the bacteria causing the infection.  Help in figuring out which antibiotic will be most effective.  Gas accumulation and muscle death (myonecrosis) may not be visible. So X-ray studies and more sophisticated imaging techniques may be helpful in making a diagnosis. They include:  Computed tomography (CT)  scans.  Magnetic resonance imaging (MRI).  These techniques are not sufficient alone to provide an accurate diagnosis of gangrene, though.  Precise diagnosis of gas gangrene often requires surgical exploration of the wound.  During such a procedure, the exposed muscle may appear pale, beefy-red. In the most advanced stages, it may be black. If infected, the muscle will fail to contract with stimulation, and the cut surface will not bleed. TREATMENT  Gaseous gangrene is a medical emergency. It requires immediate surgery and antibiotics. If the infection spreads to the bloodstream and infects vital organs, it can rapidly result in death.   Areas of dry gangrene that remain free from infection (aseptic) in the arms or hands and legs or feet (extremities) are often left to wither and fall off. Treatments applied to the wound on the surface are generally not effective without enough blood supply to support wound healing.  Evaluation by a vascular surgeon, along with X-rays to determine blood supply and circulation to the affected area, can help figure out if surgery would be helpful.  Once the cause of infection is found, moist gangrene requires immediate intravenous, intramuscular, or topical broad-spectrum antibiotic therapy. Also, the infected tissue must be  removed surgically. Amputation of the affected limb may be needed. Pain medicines (analgesics) are prescribed for pain. Fluids injected into the veins (intravenous fluids) and, occasionally, blood transfusions are used to treat shock and replenish red blood cells and electrolytes. Plenty of water and healthy foods are needed for wound healing.  Some cases of gangrene are treated by giving oxygen under pressure greater than that of the atmosphere (hyperbaric) to the patient. This happens in a specially designed chamber. The idea behind using hyperbaric oxygen is that more oxygen will become dissolved in the patient's bloodstream. So more oxygen  will be delivered to the gangrenous areas. By providing the right amount of oxygenation, the body's ability to fight off the bacterial infection is believed to be improved. There is a direct harmful effect on the bacteria that thrive in an oxygen-free environment. Some studies have shown that the use of hyperbaric oxygen relieves pain, reduces the number of amputations required, and reduces the extent of surgical debridement (removal of dead or damaged tissue).  The emotional needs of the patient must also be met. The individual with gangrene should be offered moral support, along with an opportunity to share questions and concerns. In addition, particularly in cases where amputation was required; physical, vocational, and rehabilitation therapy will also be required. Except in cases where the infection has spread to the bloodstream, the result of treated gangrene is usually favorable.  PREVENTION  Patients with diabetes or severe arteriosclerosis should take special care of their hands and feet. This is due to the risk of infection associated with even minor injuries. Education about good foot care is important. Poor blood flow as a result diseased blood vessels will lessen the body's defenses against invading germs. Your caregiver will take steps to improve circulation whenever possible. All injuries to skin or tissue should be cared for immediately. Dying or infected skin should be removed immediately. This will prevent the spread of bacteria. Wounds into the abdomen should be surgically treated and damage repaired early. This should be followed up with antibiotic treatment. Patients undergoing non-emergency (elective) intestinal surgery may receive preventive antibiotic therapy. Use of antibiotics prior to and directly following surgery has been shown to significantly reduce the rate of infection.  SEEK IMMEDIATE MEDICAL CARE IF:  You show signs of gaseous gangrene. MAKE SURE YOU:   Understand these  instructions.  Will watch your condition.  Will get help right away if you are not doing well or get worse. Document Released: 05/24/2004 Document Revised: 01/22/2012 Document Reviewed: 09/16/2013 [facility] Patient Information 2015 [place], [place]. This information is not intended to replace advice given to you by your health care provider. Make sure you discuss any questions you have with your health care provider.

## 2014-03-06 HISTORY — PX: TOE AMPUTATION: SHX809

## 2014-03-08 ENCOUNTER — Telehealth: Payer: Self-pay | Admitting: *Deleted

## 2014-03-08 ENCOUNTER — Ambulatory Visit (HOSPITAL_COMMUNITY)
Admission: RE | Admit: 2014-03-08 | Discharge: 2014-03-08 | Disposition: A | Discharge status: Home / Self Care | Payer: Medicare Other | Source: Ambulatory Visit | Attending: Podiatrist | Admitting: Podiatrist

## 2014-03-08 DIAGNOSIS — L97509 Non-pressure chronic ulcer of other part of unspecified foot with unspecified severity: Secondary | ICD-10-CM

## 2014-03-08 DIAGNOSIS — I96 Gangrene, not elsewhere classified: Secondary | ICD-10-CM | POA: Insufficient documentation

## 2014-03-08 DIAGNOSIS — I739 Peripheral vascular disease, unspecified: Secondary | ICD-10-CM

## 2014-03-08 NOTE — Telephone Encounter (Signed)
On the way to the [facility].  I have a vascular appointment today with Dr. Gwenlyn Found.  I'm wondering why?  I already see a vascular doctor on [name].  Why were you sending me to this doctor?  If possible I'd like to be transferred to the doctor I know.

## 2014-03-08 NOTE — Telephone Encounter (Signed)
Two prescriptions, one said no refills it was an antibiotic.  The other one said one refill.  Does she want me to have that refilled?  It was a blue capsule.  Call it into [facility].

## 2014-03-08 NOTE — Telephone Encounter (Signed)
I was there on Friday.  I have a conflict.  Confused about my appointment.  One is for the 10th and the other the 3rd but they're at the same location.

## 2014-03-08 NOTE — Telephone Encounter (Signed)
I returned her call.  I asked how I could help her.  She stated I thought I had called Dr. Kennon Holter office.  She said I thought I had two appointments on the same day but I see now that it is 2 separate dates.  She stated she has a conflict with the one on the 10th and wanted to know if she could reschedule it.  I told her she would have to call Dr. Kennon Holter office.  She stated okay I will.

## 2014-03-08 NOTE — Progress Notes (Signed)
Arterial Duplex Lower Ext. Completed. Essica Kiker, BS, RDMS, RVT  

## 2014-03-09 ENCOUNTER — Ambulatory Visit: Payer: Medicare Other | Admitting: Cardiovascular Disease

## 2014-03-09 ENCOUNTER — Other Ambulatory Visit: Payer: Self-pay | Admitting: Podiatrist

## 2014-03-09 ENCOUNTER — Telehealth: Payer: Self-pay | Admitting: Cardiovascular Disease

## 2014-03-09 NOTE — Telephone Encounter (Signed)
I returned the patient's call and informed her that Dr. Valentina Lucks sent you where she thought you could get in with the quickest to help prevent you losing your toe.  Dr. Valentina Lucks said that we can refer you back to [facility] if you like.  She stated no I'm fine. She said they told her that her toe didn't look good.  She stated she has a consult with Dr. Gwenlyn Found on Monday to go over the study.  She inquired about getting a refill on her antibiotics.  I told her I think Dr. Cala Bradford the refills this morning.  I told her to call before she goes to pick them up to ensure they are there.  She stated she would.

## 2014-03-09 NOTE — Telephone Encounter (Signed)
She is welcome to see her own doctor-- I made this appointment for her as an emergency appt as to help not lose her toe and this is the fastest place however we can switch it if she prefers.

## 2014-03-10 ENCOUNTER — Telehealth: Payer: Self-pay | Admitting: *Deleted

## 2014-03-10 NOTE — Telephone Encounter (Signed)
Left msg twice requesting refills on her lyrica...[name] [name]

## 2014-03-10 NOTE — Telephone Encounter (Signed)
By record, I think pt has not been rx this for about 1 yr, and is on Multiple medications, so I think we should hold off as the chance of side effects with more and more medications is quite high

## 2014-03-10 NOTE — Telephone Encounter (Signed)
Patient called back in regards to med refill.  She would like a call once script has been sent.

## 2014-03-10 NOTE — Telephone Encounter (Signed)
I left her a message that she really needs to see Dr. Valentina Lucks on Friday.  I do not think it is a good idea to reschedule due to the severity of your condition.  Is there any way possible for you to have someone be there at your home waiting for your stove to be delivered?  Again I recommend that you not reschedule your appointment.  I asked her to call if she has any further questions.

## 2014-03-10 NOTE — Telephone Encounter (Signed)
I'd like to talk to nurse about refills on my antibiotics.  I see Dr. Gwenlyn Found on Monday for a consultation.  Can I reschedule appointment I have on Friday the 7th?  I'm supposed to get a stove that I been trying to get for 2 years on Friday.  Give me a call.

## 2014-03-11 NOTE — Telephone Encounter (Signed)
Notified pt with md response. Pt states she has been taking medication. She stop at one time because she wasn't having any of the neuratophy she started back taking them back in may when she went to [place] to take care of her mom. She had a rx and it was filled at the pharmacy there, but they want transfer the prescription bck due to it being a controlled substance. Again gave md response. Inform pt if she is having the neurathopy he need to make a f/u appt md is not going to refill lyrica...[name] [name]

## 2014-03-11 NOTE — Telephone Encounter (Signed)
Closed enounter °

## 2014-03-12 ENCOUNTER — Ambulatory Visit: Payer: Medicare Other | Admitting: Podiatrist

## 2014-03-15 ENCOUNTER — Ambulatory Visit (INDEPENDENT_AMBULATORY_CARE_PROVIDER_SITE_OTHER): Payer: Medicare Other | Admitting: Cardiovascular Disease

## 2014-03-15 ENCOUNTER — Encounter: Payer: Self-pay | Admitting: Cardiovascular Disease

## 2014-03-15 ENCOUNTER — Telehealth: Payer: Self-pay | Admitting: Podiatry

## 2014-03-15 VITALS — BP 132/76 | HR 96 | Ht 67.5 in | Wt 153.7 lb

## 2014-03-15 DIAGNOSIS — I739 Peripheral vascular disease, unspecified: Secondary | ICD-10-CM

## 2014-03-15 NOTE — Assessment & Plan Note (Signed)
The patient was referred to me by Dr. Entertain from [facility] for critical limb ischemia. She's had a gangrenous appearing right second toe for several months. 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 She also has 50-100 pack years of tobacco abuse. She's never had an attack or stroke. She had Doppler studies performed in our office 03/08/14 were trivial ABIs and a 0.75 range bilaterally. All her vessels appear patent. It does appear to have a gangrenous right second toe which I suspect". I suspect she has small vessel disease but there are no obstructive lesions the major vessels which showed impaired healing. She has 2+ pedal pulses on exam.

## 2014-03-15 NOTE — Telephone Encounter (Signed)
Spoke to Dr. Gwenlyn Found. 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 Will have the patient follow up this week with Dr. Valentina Lucks for toe amp.

## 2014-03-15 NOTE — Patient Instructions (Signed)
Follow up with Dr Berry as needed.  

## 2014-03-15 NOTE — Progress Notes (Signed)
03/15/2014 [name] [name]   19-Oct-1940  [id number]  Primary Physician Cathlean Cower, MD Primary Cardiologist: Lorretta Harp MD Renae Gloss   HPI:  [name] [name] is a 73 year old mildly overweight single African American female with no children referred to me by Dr. Valentina Lucks for peripheral vascular evaluation.her cardiovascular risk factors are notable for hypertension in the past as well as tobacco abuse. She is currently on hemodialysis. She has a gangrenous right second toe with Dopplers revealed ABIs of the mid 0.7 range bilaterally with patent vessels otherwise.   Current Outpatient Prescriptions  Medication Sig Dispense Refill  . albuterol (PROVENTIL HFA;VENTOLIN HFA) 108 (90 BASE) MCG/ACT inhaler Inhale 2 puffs into the lungs every 6 (six) hours as needed for wheezing or shortness of breath.  1 Inhaler  11  . allopurinol (ZYLOPRIM) 100 MG tablet Take 100 mg by mouth daily.       Marland Kitchen allopurinol (ZYLOPRIM) 100 MG tablet TAKE ONE TABLET BY MOUTH ONCE DAILY  90 tablet  3  . amLODipine (NORVASC) 5 MG tablet TAKE ONE TABLET BY MOUTH ONCE DAILY  90 tablet  3  . aspirin EC 81 MG tablet Take 81 mg by mouth daily.      Marland Kitchen atorvastatin (LIPITOR) 40 MG tablet Take 1 tablet (40 mg total) by mouth daily.  30 tablet  11  . benzonatate (TESSALON PERLES) 100 MG capsule 1-2 tabs by mouth every 8 hrs as needed for cough  60 capsule  1  . Blood Pressure Monitoring (BLOOD PRESSURE CUFF) MISC 1 application by Does not apply route daily.  1 each  0  . calcium carbonate (OS-CAL) 600 MG TABS Take 600 mg by mouth 2 (two) times daily with a meal.      . ciprofloxacin (CIPRO) 500 MG tablet Take 1 tablet (500 mg total) by mouth 2 (two) times daily.  20 tablet  0  . clindamycin (CLEOCIN) 300 MG capsule Take 1 capsule (300 mg total) by mouth 3 (three) times daily.  30 capsule  0  . clonazePAM (KLONOPIN) 2 MG tablet TAKE ONE TABLET BY MOUTH TWICE DAILY AS NEEDED  60 tablet  2  . cyclobenzaprine (FLEXERIL) 10  MG tablet Take 10 mg by mouth 3 (three) times daily as needed. For muscle spasms      . cyproheptadine (PERIACTIN) 4 MG tablet Take 1 tablet (4 mg total) by mouth 2 (two) times daily.  60 tablet  3  . dextromethorphan-guaiFENesin (MUCINEX DM) 30-600 MG per 12 hr tablet Take 1 tablet by mouth every 12 (twelve) hours.      . docusate sodium (COLACE) 100 MG capsule Take 100 mg by mouth 2 (two) times daily as needed. For constipation      . DULoxetine (CYMBALTA) 30 MG capsule Take 1 capsule (30 mg total) by mouth daily.  90 capsule  3  . HYDROcodone-acetaminophen (NORCO/VICODIN) 5-325 MG per tablet Take 1 tablet by mouth every 6 (six) hours as needed for pain. To fill Aug 07, 2013  120 tablet  0  . HYDROcodone-acetaminophen (NORCO/VICODIN) 5-325 MG per tablet Take 1 tablet by mouth every 4 (four) hours as needed.  40 tablet  0  . hydrOXYzine (ATARAX/VISTARIL) 25 MG tablet Take 1 tablet (25 mg total) by mouth every 6 (six) hours as needed.  60 tablet  2  . lactulose (CHRONULAC) 10 GM/15ML solution TAKE 30 MLS BY MOUTH THREE  TIMES A DAY  240 mL  11  . levothyroxine (SYNTHROID, LEVOTHROID)  125 MCG tablet TAKE ONE TABLET BY MOUTH ONCE DAILY  90 tablet  1  . mometasone-formoterol (DULERA) 100-5 MCG/ACT AERO Inhale 2 puffs into the lungs 2 (two) times daily.  1 Inhaler  6  . montelukast (SINGULAIR) 10 MG tablet TAKE ONE TABLET BY MOUTH AT BEDTIME  30 tablet  11  . multivitamin (RENA-VIT) TABS tablet Take 1 tablet by mouth daily.      . mupirocin ointment (BACTROBAN) 2 % Apply 1 application topically 2 (two) times daily.  30 g  2  . mupirocin ointment (BACTROBAN) 2 % Apply 1 application topically 2 (two) times daily.  30 g  2  . omeprazole (PRILOSEC) 40 MG capsule Take 40 mg by mouth daily.       . promethazine (PHENERGAN) 25 MG tablet Take 1 tablet (25 mg total) by mouth every 6 (six) hours as needed for nausea.  30 tablet  2  . QUEtiapine (SEROQUEL) 100 MG tablet TAKE ONE TABLET BY MOUTH AT BEDTIME  90  tablet  0  . QUEtiapine (SEROQUEL) 100 MG tablet TAKE ONE TABLET BY MOUTH AT BEDTIME  90 tablet  3  . triamcinolone cream (KENALOG) 0.1 % APPLY  TO AFFECTED AREA TWICE DAILY  30 g  11   No current facility-administered medications for this visit.    Allergies  Allergen Reactions  . Cephalosporins Itching and Rash    Vanc and fortaz given at the same time in June for several doses at dialysis with systemic rash and itching; received zinacef 7/5 and had worseningsystemicrash/ itching and swelling of eyes - so unclear if allergic to either or both  . Nsaids     REACTION: renal dysfunction  . Pioglitazone     Actos REACTION: EDEMA  . Vancomycin     See comment under cephalosporin    History   Social History  . Marital Status: Single    Spouse Name: N/A    Number of Children: N/A  . Years of Education: N/A   Occupational History  . disabled     c-spine and back pain   Social History Main Topics  . Smoking status: Former Smoker -- 0.25 packs/day for 47 years    Types: Cigarettes  . Smokeless tobacco: Never Used     Comment: USING E CIG  . Alcohol Use: No  . Drug Use: No  . Sexual Activity: Not on file   Other Topics Concern  . Not on file   Social History Narrative   Currently on disability for her back pain.  Lives alone, though has been thinking about moving to ALF/SNF recently.     Review of Systems: General: negative for chills, fever, night sweats or weight changes.  Cardiovascular: negative for chest pain, dyspnea on exertion, edema, orthopnea, palpitations, paroxysmal nocturnal dyspnea or shortness of breath Dermatological: negative for rash Respiratory: negative for cough or wheezing Urologic: negative for hematuria Abdominal: negative for nausea, vomiting, diarrhea, bright red blood per rectum, melena, or hematemesis Neurologic: negative for visual changes, syncope, or dizziness All other systems reviewed and are otherwise negative except as noted  above.    Blood pressure 132/76, pulse 96, height 5' 7.5" (1.715 m), weight 153 lb 11.2 oz (69.718 kg).  General appearance: alert and no distress Neck: no adenopathy, no carotid bruit, no JVD, supple, symmetrical, trachea midline and thyroid not enlarged, symmetric, no tenderness/mass/nodules Lungs: clear to auscultation bilaterally Heart: regular rate and rhythm, S1, S2 normal, no murmur, click, rub or gallop Extremities:  extremities normal, atraumatic, no cyanosis or edema and 2+ pedal pulses bilaterally, gangrenous right second toe  EKG not performed today  ASSESSMENT AND PLAN:   PERIPHERAL VASCULAR DISEASE The patient was referred to me by Dr. Entertain from [facility] for critical limb ischemia. She's had a gangrenous appearing right second toe for several months. 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 She also has 50-100 pack years of tobacco abuse. She's never had an attack or stroke. She had Doppler studies performed in our office 03/08/14 were trivial ABIs and a 0.75 range bilaterally. All her vessels appear patent. It does appear to have a gangrenous right second toe which I suspect". I suspect she has small vessel disease but there are no obstructive lesions the major vessels which showed impaired healing. She has 2+ pedal pulses on exam.      Lorretta Harp MD [facility] 03/15/2014 2:46 PM

## 2014-03-17 ENCOUNTER — Ambulatory Visit: Payer: Medicare Other | Admitting: Podiatrist

## 2014-03-19 ENCOUNTER — Encounter: Payer: Self-pay | Admitting: Podiatrist

## 2014-03-19 ENCOUNTER — Ambulatory Visit (INDEPENDENT_AMBULATORY_CARE_PROVIDER_SITE_OTHER): Payer: Medicare Other | Admitting: Podiatrist

## 2014-03-19 ENCOUNTER — Telehealth: Payer: Self-pay | Admitting: *Deleted

## 2014-03-19 VITALS — BP 101/51 | HR 105 | Resp 16

## 2014-03-19 DIAGNOSIS — L97509 Non-pressure chronic ulcer of other part of unspecified foot with unspecified severity: Secondary | ICD-10-CM

## 2014-03-19 DIAGNOSIS — L97513 Non-pressure chronic ulcer of other part of right foot with necrosis of muscle: Secondary | ICD-10-CM

## 2014-03-19 DIAGNOSIS — I96 Gangrene, not elsewhere classified: Secondary | ICD-10-CM

## 2014-03-19 MED ORDER — HYDROCODONE-ACETAMINOPHEN 5-325 MG PO TABS: 1.0000 | ORAL_TABLET | ORAL | Status: DC | PRN

## 2014-03-19 MED ORDER — CLINDAMYCIN HCL 300 MG PO CAPS: 300.0000 mg | ORAL_CAPSULE | Freq: Three times a day (TID) | ORAL | Status: DC

## 2014-03-19 NOTE — Telephone Encounter (Signed)
I'm supposed to be scheduled for surgery on the 20th.  I told her I have her scheduled for the 20th.  She said my paper that I have says the 17th.  I asked her to mark through it and put down the 20th instead.  She stated okay, have you arranged it with the surgical center?  I told her I had already scheduled it.  She stated okay.

## 2014-03-19 NOTE — Progress Notes (Signed)
Subjective: Patient presents today forfollowup status post darkening discoloration of the right second toe with gangrenous changes present. She has been dressing the toe as instructed.  She states she's had pain in the toe. She saw Dr. 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 abi .76 right and .74 left -- he suggested an amputation of the toe.  Objective:  Neurovascular status is unchanged and pedal pulses continued to be faint to nonpalpable. Gangrenous changes to the right second toe are noted in the tendon itself is darkening. There is some malodor present at today's visit concerning for worsening infection.  Assessment: Ulceration with gangrenous changes right second toe, decreased pulses right   Plan: Discussed that I do recommend an amputation of the right second toe sooner rather than later. Discussed the possibility of worsening infection proximal to the foot the longer we wait. A consent form was signed for the amputation of the right second toe only. The patient's questions were encouraged and answered to the best of my ability. She will be seen at [facility] specialty surgery center on outpatient basis.  Applied Iodosorb over the ulceration and wrapped a piece of gauze and paper tape on the toe and gave her instructions for continued use. Wrote a prescription for clindamycin for her to continue to take and a prescription for hydrocodone for pain.

## 2014-03-19 NOTE — Patient Instructions (Signed)
Pre-Operative Instructions  Congratulations, you have decided to take an important step to improving your quality of life.  You can be assured that the doctors of [facility] will be with you every step of the way.  1. Plan to be at the surgery center/hospital at least 1 (one) hour prior to your scheduled time unless otherwise directed by the surgical center/hospital staff.  You must have a responsible adult accompany you, remain during the surgery and drive you home.  Make sure you have directions to the surgical center/hospital and know how to get there on time. 2. For hospital based surgery you will need to obtain a history and physical form from your family physician within 1 month prior to the date of surgery- we will give you a form for you primary physician.  3. We make every effort to accommodate the date you request for surgery.  There are however, times where surgery dates or times have to be moved.  We will contact you as soon as possible if a change in schedule is required.   4. No Aspirin/Ibuprofen for one week before surgery.  If you are on aspirin, any non-steroidal anti-inflammatory medications (Mobic, Aleve, Ibuprofen) you should stop taking it 7 days prior to your surgery.  You make take Tylenol  For pain prior to surgery.  5. Medications- If you are taking daily heart and blood pressure medications, seizure, reflux, allergy, asthma, anxiety, pain or diabetes medications, make sure the surgery center/hospital is aware before the day of surgery so they may notify you which medications to take or avoid the day of surgery. 6. No food or drink after midnight the night before surgery unless directed otherwise by surgical center/hospital staff. 7. No alcoholic beverages 24 hours prior to surgery.  No smoking 24 hours prior to or 24 hours after surgery. 8. Wear loose pants or shorts- loose enough to fit over bandages, boots, and casts. 9. No slip on shoes, sneakers are best. 10. Bring  your boot with you to the surgery center/hospital.  Also bring crutches or a walker if your physician has prescribed it for you.  If you do not have this equipment, it will be provided for you after surgery. 11. If you have not been contracted by the surgery center/hospital by the day before your surgery, call to confirm the date and time of your surgery. 12. Leave-time from work may vary depending on the type of surgery you have.  Appropriate arrangements should be made prior to surgery with your employer. 13. Prescriptions will be provided immediately following surgery by your doctor.  Have these filled as soon as possible after surgery and take the medication as directed. 14. Remove nail polish on the operative foot. 15. Wash the night before surgery.  The night before surgery wash the foot and leg well with the antibacterial soap provided and water paying special attention to beneath the toenails and in between the toes.  Rinse thoroughly with water and dry well with a towel.  Perform this wash unless told not to do so by your physician.  Enclosed: 1 Ice pack (please put in freezer the night before surgery)   1 Hibiclens skin cleaner   Pre-op Instructions  If you have any questions regarding the instructions, do not hesitate to call our office.  [place]: [address] [place], [place] [address] [phone]  [place]: [address] [place], [place] [address] [phone]  [place]: [address]  [place], [place] [address] [phone]  Dr. Richard   Tuchman DPM, Dr. Norman Regal DPM Dr. Richard Sikora DPM, Dr. M. Todd Hyatt DPM, Dr. Makara Lanzo DPM 

## 2014-03-22 ENCOUNTER — Telehealth: Payer: Self-pay | Admitting: *Deleted

## 2014-03-22 ENCOUNTER — Other Ambulatory Visit: Payer: Self-pay | Admitting: Adult Health

## 2014-03-22 NOTE — Telephone Encounter (Signed)
I need to talk to the nurse.  I'm having a hard time finding someone to stay with me after surgery.  Only thing they can do is pick me up after surgery.  Give me a call back and let me know if I can have it done at the hospital and spend the night.  Can I have it done before 10:30am?  Give me a call back.

## 2014-03-23 NOTE — Telephone Encounter (Signed)
I left her a message to give me a call back.

## 2014-03-23 NOTE — Telephone Encounter (Signed)
I'm returning your call.  I decided what I want to do about the surgery.  Thank you, have a good day.

## 2014-03-23 NOTE — Telephone Encounter (Signed)
I could do it under local anesthesia only and she wouldn't need anyone with her but she will be fully awake throughout the procedure.  If she would like to do this, please call the surgery center and notify them of the local only change so they can arrange personnel for the room.  Thanks!

## 2014-03-23 NOTE — Telephone Encounter (Signed)
I'm returning your call.  I'm supposed to have surgery on Thursday.  Give me a call I'd appreciate it.  I'm just returning your call.  I called and informed her that her surgery will be Thursday at 10:30am, you will need to be there at 9:30am.  She stated they told me I needed to be there at 10:30am.  I told her you called the surgical center and asked if it could be moved up earlier.  She said I called there yesterday and asked if I could have it done at the hospital so I can stay over.  I told her Dr. Valentina Lucks said she can just give you a local injection in that area and remove the toe but you will be awake during the procedure.  If you go this route, you will not need anyone to stay with you.  She stated I don't know.  She asked, what time will my surgery start?  I told her it will start at 10:30am.  She asked, how long does the surgery take?  I told her about 45 minutes.  She asked, so when will the surgery be over.  I told her around 11:30am.  She stated I need to know so I can let my ride know.  I asked her what would she like to do, have it under local sedation only or be put to sleep.  She said I don't know.   I am scared to see it being done.  I told her to think about it and give me a call back.

## 2014-03-23 NOTE — Telephone Encounter (Signed)
I returned her call.  She said, I decided to do the procedure where they put me under.  I asked her if she had someone to stay with her.  She stated yes.  She asked, what time do I need to be there?  I told her she will need to be there at 9:30am.  She said I thought that was just for the procedure where they just numbed me up.  She stated, I thought it was going to be at 10:30am.  I told her no, you called the surgical center and asked them to move the surgery up.  She asked what time will I be finished.  I told her around 50.  She stated I'm confused, I was told that if it had been at 10:30am I would be finished by 3pm.  I told her the procedure is only 45 minutes.  She stated but I'm going to be put under.  I told her yes but it doesn't take that long to recover.  She stated they told me 3pm.  I told her the surgical center is going to call you on tomorrow ask them, they may be able to give you more information.  She stated okay, I will.

## 2014-03-24 ENCOUNTER — Other Ambulatory Visit: Payer: Self-pay

## 2014-03-24 MED ORDER — MOMETASONE FURO-FORMOTEROL FUM 100-5 MCG/ACT IN AERO: 2.0000 | INHALATION_SPRAY | Freq: Two times a day (BID) | RESPIRATORY_TRACT | Status: DC

## 2014-03-25 DIAGNOSIS — L03039 Cellulitis of unspecified toe: Secondary | ICD-10-CM

## 2014-03-25 DIAGNOSIS — L02619 Cutaneous abscess of unspecified foot: Secondary | ICD-10-CM

## 2014-03-26 ENCOUNTER — Telehealth: Payer: Self-pay | Admitting: *Deleted

## 2014-03-26 MED ORDER — OXYCODONE-ACETAMINOPHEN 7.5-325 MG PO TABS: 1.0000 | ORAL_TABLET | ORAL | Status: DC | PRN

## 2014-03-26 NOTE — Telephone Encounter (Signed)
rx written and outside of back door in one of the white boxes.  Thanks.

## 2014-03-26 NOTE — Telephone Encounter (Signed)
I returned the patient's call.  I asked her how many pills she was taking each time.  She stated she was taking one.  I told her she can take 2 every 4-6 hours.  She stated okay.  I told her that Dr. Valentina Lucks said she can take the ace bandage off and the sticky stuff underneath but don't touch the white gauze underneath and reapply the ace.  She stated it is not tight.  I told her then you don't have to take it off.  I advised her to elevate as much as possible.  She stated she has been propping it up on a stool with a pillow.  She said thanks for calling.  I told her to have a great weekend.

## 2014-03-26 NOTE — Telephone Encounter (Signed)
The medicine she gave me for pain is not helping at all.  Can she give me something else?   I didn't sleep at all because of it.  Please give me a call.  Have a blessed day.

## 2014-03-26 NOTE — Telephone Encounter (Signed)
Good morning, this is Matalie Maddix calling again.  The surgical center called to see how I am doing.  The medicine you gave me for pain is not working.  I need something stronger because I can't stand this pain.  I was up all night.  Give me a call please.

## 2014-03-26 NOTE — Telephone Encounter (Signed)
Tell her to take 2 pills every 4 to 6 hours instead of just 1.  If that doesn't work, let me know and I can write her for a stronger medication.Marland Kitchen However, she will have to come by and pick it up or send someone over to get the rx as these are not allowed to be called in. thx.

## 2014-03-30 ENCOUNTER — Emergency Department (HOSPITAL_COMMUNITY): Payer: Medicare Other

## 2014-03-30 ENCOUNTER — Inpatient Hospital Stay (HOSPITAL_COMMUNITY)
Admission: EM | Admit: 2014-03-30 | Discharge: 2014-04-05 | DRG: 640 | Disposition: A | Discharge status: Skilled Nursing Facility | Payer: Medicare Other | Attending: Internal Medicine | Admitting: Internal Medicine

## 2014-03-30 ENCOUNTER — Encounter (HOSPITAL_COMMUNITY): Payer: Self-pay | Admitting: Emergency Medicine

## 2014-03-30 DIAGNOSIS — R234 Changes in skin texture: Secondary | ICD-10-CM

## 2014-03-30 DIAGNOSIS — G609 Hereditary and idiopathic neuropathy, unspecified: Secondary | ICD-10-CM

## 2014-03-30 DIAGNOSIS — Z881 Allergy status to other antibiotic agents status: Secondary | ICD-10-CM | POA: Diagnosis not present

## 2014-03-30 DIAGNOSIS — F411 Generalized anxiety disorder: Secondary | ICD-10-CM | POA: Diagnosis present

## 2014-03-30 DIAGNOSIS — J449 Chronic obstructive pulmonary disease, unspecified: Secondary | ICD-10-CM | POA: Diagnosis present

## 2014-03-30 DIAGNOSIS — M109 Gout, unspecified: Secondary | ICD-10-CM | POA: Diagnosis present

## 2014-03-30 DIAGNOSIS — J4489 Other specified chronic obstructive pulmonary disease: Secondary | ICD-10-CM

## 2014-03-30 DIAGNOSIS — G2581 Restless legs syndrome: Secondary | ICD-10-CM

## 2014-03-30 DIAGNOSIS — G4700 Insomnia, unspecified: Secondary | ICD-10-CM

## 2014-03-30 DIAGNOSIS — IMO0002 Reserved for concepts with insufficient information to code with codable children: Secondary | ICD-10-CM

## 2014-03-30 DIAGNOSIS — D631 Anemia in chronic kidney disease: Secondary | ICD-10-CM | POA: Diagnosis present

## 2014-03-30 DIAGNOSIS — F329 Major depressive disorder, single episode, unspecified: Secondary | ICD-10-CM | POA: Diagnosis present

## 2014-03-30 DIAGNOSIS — Z860100 Personal history of colon polyps, unspecified: Secondary | ICD-10-CM

## 2014-03-30 DIAGNOSIS — K5900 Constipation, unspecified: Secondary | ICD-10-CM

## 2014-03-30 DIAGNOSIS — R413 Other amnesia: Secondary | ICD-10-CM

## 2014-03-30 DIAGNOSIS — N2581 Secondary hyperparathyroidism of renal origin: Secondary | ICD-10-CM | POA: Diagnosis present

## 2014-03-30 DIAGNOSIS — R5383 Other fatigue: Secondary | ICD-10-CM

## 2014-03-30 DIAGNOSIS — M949 Disorder of cartilage, unspecified: Secondary | ICD-10-CM

## 2014-03-30 DIAGNOSIS — R569 Unspecified convulsions: Secondary | ICD-10-CM

## 2014-03-30 DIAGNOSIS — E785 Hyperlipidemia, unspecified: Secondary | ICD-10-CM | POA: Diagnosis present

## 2014-03-30 DIAGNOSIS — E1169 Type 2 diabetes mellitus with other specified complication: Secondary | ICD-10-CM | POA: Diagnosis present

## 2014-03-30 DIAGNOSIS — Z992 Dependence on renal dialysis: Secondary | ICD-10-CM | POA: Diagnosis not present

## 2014-03-30 DIAGNOSIS — Z87891 Personal history of nicotine dependence: Secondary | ICD-10-CM

## 2014-03-30 DIAGNOSIS — N189 Chronic kidney disease, unspecified: Secondary | ICD-10-CM | POA: Diagnosis present

## 2014-03-30 DIAGNOSIS — K219 Gastro-esophageal reflux disease without esophagitis: Secondary | ICD-10-CM | POA: Diagnosis present

## 2014-03-30 DIAGNOSIS — G40909 Epilepsy, unspecified, not intractable, without status epilepticus: Secondary | ICD-10-CM | POA: Diagnosis present

## 2014-03-30 DIAGNOSIS — Z9181 History of falling: Secondary | ICD-10-CM | POA: Diagnosis not present

## 2014-03-30 DIAGNOSIS — Z7982 Long term (current) use of aspirin: Secondary | ICD-10-CM | POA: Diagnosis not present

## 2014-03-30 DIAGNOSIS — G629 Polyneuropathy, unspecified: Secondary | ICD-10-CM

## 2014-03-30 DIAGNOSIS — M542 Cervicalgia: Secondary | ICD-10-CM

## 2014-03-30 DIAGNOSIS — N179 Acute kidney failure, unspecified: Secondary | ICD-10-CM

## 2014-03-30 DIAGNOSIS — Z0000 Encounter for general adult medical examination without abnormal findings: Secondary | ICD-10-CM

## 2014-03-30 DIAGNOSIS — Z886 Allergy status to analgesic agent status: Secondary | ICD-10-CM

## 2014-03-30 DIAGNOSIS — E8779 Other fluid overload: Secondary | ICD-10-CM | POA: Diagnosis present

## 2014-03-30 DIAGNOSIS — R112 Nausea with vomiting, unspecified: Secondary | ICD-10-CM

## 2014-03-30 DIAGNOSIS — M62838 Other muscle spasm: Secondary | ICD-10-CM

## 2014-03-30 DIAGNOSIS — R002 Palpitations: Secondary | ICD-10-CM

## 2014-03-30 DIAGNOSIS — E039 Hypothyroidism, unspecified: Secondary | ICD-10-CM | POA: Diagnosis present

## 2014-03-30 DIAGNOSIS — J309 Allergic rhinitis, unspecified: Secondary | ICD-10-CM

## 2014-03-30 DIAGNOSIS — Z888 Allergy status to other drugs, medicaments and biological substances status: Secondary | ICD-10-CM

## 2014-03-30 DIAGNOSIS — N259 Disorder resulting from impaired renal tubular function, unspecified: Secondary | ICD-10-CM

## 2014-03-30 DIAGNOSIS — L509 Urticaria, unspecified: Secondary | ICD-10-CM

## 2014-03-30 DIAGNOSIS — E875 Hyperkalemia: Principal | ICD-10-CM

## 2014-03-30 DIAGNOSIS — F3289 Other specified depressive episodes: Secondary | ICD-10-CM

## 2014-03-30 DIAGNOSIS — N390 Urinary tract infection, site not specified: Secondary | ICD-10-CM

## 2014-03-30 DIAGNOSIS — I150 Renovascular hypertension: Secondary | ICD-10-CM

## 2014-03-30 DIAGNOSIS — F17200 Nicotine dependence, unspecified, uncomplicated: Secondary | ICD-10-CM

## 2014-03-30 DIAGNOSIS — R109 Unspecified abdominal pain: Secondary | ICD-10-CM

## 2014-03-30 DIAGNOSIS — N186 End stage renal disease: Secondary | ICD-10-CM

## 2014-03-30 DIAGNOSIS — I120 Hypertensive chronic kidney disease with stage 5 chronic kidney disease or end stage renal disease: Secondary | ICD-10-CM | POA: Diagnosis present

## 2014-03-30 DIAGNOSIS — M1710 Unilateral primary osteoarthritis, unspecified knee: Secondary | ICD-10-CM

## 2014-03-30 DIAGNOSIS — R300 Dysuria: Secondary | ICD-10-CM

## 2014-03-30 DIAGNOSIS — Z9115 Patient's noncompliance with renal dialysis: Secondary | ICD-10-CM

## 2014-03-30 DIAGNOSIS — Z8249 Family history of ischemic heart disease and other diseases of the circulatory system: Secondary | ICD-10-CM | POA: Diagnosis not present

## 2014-03-30 DIAGNOSIS — M549 Dorsalgia, unspecified: Secondary | ICD-10-CM

## 2014-03-30 DIAGNOSIS — I739 Peripheral vascular disease, unspecified: Secondary | ICD-10-CM | POA: Diagnosis present

## 2014-03-30 DIAGNOSIS — Z791 Long term (current) use of non-steroidal anti-inflammatories (NSAID): Secondary | ICD-10-CM | POA: Diagnosis not present

## 2014-03-30 DIAGNOSIS — Z823 Family history of stroke: Secondary | ICD-10-CM | POA: Diagnosis not present

## 2014-03-30 DIAGNOSIS — Z8679 Personal history of other diseases of the circulatory system: Secondary | ICD-10-CM

## 2014-03-30 DIAGNOSIS — M25561 Pain in right knee: Secondary | ICD-10-CM

## 2014-03-30 DIAGNOSIS — D649 Anemia, unspecified: Secondary | ICD-10-CM

## 2014-03-30 DIAGNOSIS — R2681 Unsteadiness on feet: Secondary | ICD-10-CM

## 2014-03-30 DIAGNOSIS — G43009 Migraine without aura, not intractable, without status migrainosus: Secondary | ICD-10-CM

## 2014-03-30 DIAGNOSIS — K279 Peptic ulcer, site unspecified, unspecified as acute or chronic, without hemorrhage or perforation: Secondary | ICD-10-CM

## 2014-03-30 DIAGNOSIS — I10 Essential (primary) hypertension: Secondary | ICD-10-CM

## 2014-03-30 DIAGNOSIS — Z91158 Patient's noncompliance with renal dialysis for other reason: Secondary | ICD-10-CM

## 2014-03-30 DIAGNOSIS — N039 Chronic nephritic syndrome with unspecified morphologic changes: Secondary | ICD-10-CM

## 2014-03-30 DIAGNOSIS — G4710 Hypersomnia, unspecified: Secondary | ICD-10-CM

## 2014-03-30 DIAGNOSIS — R1084 Generalized abdominal pain: Secondary | ICD-10-CM

## 2014-03-30 DIAGNOSIS — M899 Disorder of bone, unspecified: Secondary | ICD-10-CM | POA: Diagnosis present

## 2014-03-30 DIAGNOSIS — R269 Unspecified abnormalities of gait and mobility: Secondary | ICD-10-CM

## 2014-03-30 DIAGNOSIS — G8929 Other chronic pain: Secondary | ICD-10-CM

## 2014-03-30 DIAGNOSIS — R5381 Other malaise: Secondary | ICD-10-CM

## 2014-03-30 DIAGNOSIS — M8580 Other specified disorders of bone density and structure, unspecified site: Secondary | ICD-10-CM

## 2014-03-30 DIAGNOSIS — M5412 Radiculopathy, cervical region: Secondary | ICD-10-CM

## 2014-03-30 DIAGNOSIS — M25562 Pain in left knee: Secondary | ICD-10-CM

## 2014-03-30 DIAGNOSIS — N959 Unspecified menopausal and perimenopausal disorder: Secondary | ICD-10-CM

## 2014-03-30 DIAGNOSIS — E162 Hypoglycemia, unspecified: Secondary | ICD-10-CM

## 2014-03-30 DIAGNOSIS — R531 Weakness: Secondary | ICD-10-CM

## 2014-03-30 DIAGNOSIS — G9340 Encephalopathy, unspecified: Secondary | ICD-10-CM

## 2014-03-30 DIAGNOSIS — R7302 Impaired glucose tolerance (oral): Secondary | ICD-10-CM

## 2014-03-30 HISTORY — DX: End stage renal disease: N18.6

## 2014-03-30 HISTORY — DX: Dependence on renal dialysis: Z99.2

## 2014-03-30 LAB — URINALYSIS, ROUTINE W REFLEX MICROSCOPIC
Glucose, UA: NEGATIVE mg/dL
Hgb urine dipstick: NEGATIVE
Ketones, ur: NEGATIVE mg/dL
Nitrite: NEGATIVE
PH: 6.5 (ref 5.0–8.0)
Protein, ur: 100 mg/dL — AB
SPECIFIC GRAVITY, URINE: 1.016 (ref 1.005–1.030)
Urobilinogen, UA: 0.2 mg/dL (ref 0.0–1.0)

## 2014-03-30 LAB — I-STAT CHEM 8, ED
BUN: 55 mg/dL — ABNORMAL HIGH (ref 6–23)
CHLORIDE: 100 meq/L (ref 96–112)
Calcium, Ion: 1.04 mmol/L — ABNORMAL LOW (ref 1.13–1.30)
Creatinine, Ser: 14 mg/dL — ABNORMAL HIGH (ref 0.50–1.10)
GLUCOSE: 77 mg/dL (ref 70–99)
HEMATOCRIT: 32 % — AB (ref 36.0–46.0)
Hemoglobin: 10.9 g/dL — ABNORMAL LOW (ref 12.0–15.0)
POTASSIUM: 6 meq/L — AB (ref 3.7–5.3)
Sodium: 135 mEq/L — ABNORMAL LOW (ref 137–147)
TCO2: 25 mmol/L (ref 0–100)

## 2014-03-30 LAB — COMPREHENSIVE METABOLIC PANEL
ALBUMIN: 3.4 g/dL — AB (ref 3.5–5.2)
ALK PHOS: 111 U/L (ref 39–117)
ALT: <5 U/L (ref 0–35)
ANION GAP: 18 — AB (ref 5–15)
AST: 28 U/L (ref 0–37)
BUN: 58 mg/dL — ABNORMAL HIGH (ref 6–23)
CO2: 25 mEq/L (ref 19–32)
Calcium: 9 mg/dL (ref 8.4–10.5)
Chloride: 96 mEq/L (ref 96–112)
Creatinine, Ser: 13.34 mg/dL — ABNORMAL HIGH (ref 0.50–1.10)
GFR calc Af Amer: 3 mL/min — ABNORMAL LOW (ref >90)
GFR calc non Af Amer: 2 mL/min — ABNORMAL LOW (ref >90)
Glucose, Bld: 80 mg/dL (ref 70–99)
POTASSIUM: 6.2 meq/L — AB (ref 3.7–5.3)
SODIUM: 139 meq/L (ref 137–147)
TOTAL PROTEIN: 7.6 g/dL (ref 6.0–8.3)
Total Bilirubin: 0.3 mg/dL (ref 0.3–1.2)

## 2014-03-30 LAB — CBC
HEMATOCRIT: 29.4 % — AB (ref 36.0–46.0)
Hemoglobin: 9.9 g/dL — ABNORMAL LOW (ref 12.0–15.0)
MCH: 30.2 pg (ref 26.0–34.0)
MCHC: 33.7 g/dL (ref 30.0–36.0)
MCV: 89.6 fL (ref 78.0–100.0)
PLATELETS: 280 10*3/uL (ref 150–400)
RBC: 3.28 MIL/uL — ABNORMAL LOW (ref 3.87–5.11)
RDW: 15.9 % — AB (ref 11.5–15.5)
WBC: 8.2 10*3/uL (ref 4.0–10.5)

## 2014-03-30 LAB — CBG MONITORING, ED
GLUCOSE-CAPILLARY: 89 mg/dL (ref 70–99)
GLUCOSE-CAPILLARY: 54 mg/dL — AB (ref 70–99)
Glucose-Capillary: 51 mg/dL — ABNORMAL LOW (ref 70–99)

## 2014-03-30 LAB — URINE MICROSCOPIC-ADD ON

## 2014-03-30 MED ORDER — INSULIN ASPART 100 UNIT/ML ~~LOC~~ SOLN
SUBCUTANEOUS | Status: AC
  Administered 2014-03-30: 10 [IU]
  Filled 2014-03-30: qty 1

## 2014-03-30 MED ORDER — GLUCAGON HCL RDNA (DIAGNOSTIC) 1 MG IJ SOLR: 0.5000 mg | Freq: Once | INTRAMUSCULAR | Status: AC | PRN

## 2014-03-30 MED ORDER — DEXTROSE 50 % IV SOLN
1.0000 | Freq: Once | INTRAVENOUS | Status: AC
  Administered 2014-03-30: 50 mL via INTRAVENOUS
  Filled 2014-03-30: qty 50

## 2014-03-30 MED ORDER — INSULIN ASPART 100 UNIT/ML IV SOLN
10.0000 [IU] | Freq: Once | INTRAVENOUS | Status: AC
  Filled 2014-03-30: qty 0.1

## 2014-03-30 MED ORDER — DEXTROSE 50 % IV SOLN: 25.0000 mL | Freq: Once | INTRAVENOUS | Status: AC | PRN

## 2014-03-30 MED ORDER — DEXTROSE 10 % IV SOLN
INTRAVENOUS | Status: DC
  Administered 2014-03-30 – 2014-04-02 (×2): via INTRAVENOUS

## 2014-03-30 MED ORDER — ALBUTEROL SULFATE (2.5 MG/3ML) 0.083% IN NEBU
5.0000 mg | INHALATION_SOLUTION | Freq: Once | RESPIRATORY_TRACT | Status: AC
  Administered 2014-03-30: 5 mg via RESPIRATORY_TRACT
  Filled 2014-03-30: qty 6

## 2014-03-30 MED ORDER — ALBUTEROL SULFATE (2.5 MG/3ML) 0.083% IN NEBU: 2.5000 mg | INHALATION_SOLUTION | RESPIRATORY_TRACT | Status: AC | PRN

## 2014-03-30 MED ORDER — SODIUM POLYSTYRENE SULFONATE 15 GM/60ML PO SUSP
30.0000 g | Freq: Once | ORAL | Status: AC
  Administered 2014-03-30: 30 g via ORAL

## 2014-03-30 MED ORDER — GLUCAGON HCL RDNA (DIAGNOSTIC) 1 MG IJ SOLR: 1.0000 mg | Freq: Once | INTRAMUSCULAR | Status: AC | PRN

## 2014-03-30 MED ORDER — DEXTROSE 50 % IV SOLN: 50.0000 mL | Freq: Once | INTRAVENOUS | Status: DC

## 2014-03-30 MED ORDER — DEXTROSE 50 % IV SOLN
50.0000 mL | Freq: Once | INTRAVENOUS | Status: AC | PRN
  Filled 2014-03-30: qty 50

## 2014-03-30 MED ORDER — SODIUM POLYSTYRENE SULFONATE 15 GM/60ML PO SUSP
50.0000 g | Freq: Once | ORAL | Status: DC
  Filled 2014-03-30: qty 240

## 2014-03-30 MED ORDER — SODIUM CHLORIDE 0.9 % IV SOLN
1.0000 g | Freq: Once | INTRAVENOUS | Status: AC
  Administered 2014-03-30: 1 g via INTRAVENOUS
  Filled 2014-03-30: qty 10

## 2014-03-30 MED ORDER — GLUCOSE 40 % PO GEL: 1.0000 | ORAL | Status: DC | PRN

## 2014-03-30 MED ORDER — INSULIN ASPART 100 UNIT/ML ~~LOC~~ SOLN: 0.0000 [IU] | SUBCUTANEOUS | Status: DC

## 2014-03-30 NOTE — ED Notes (Signed)
Reported wheezing to [place], [place].

## 2014-03-30 NOTE — ED Notes (Signed)
Blood glucose recheck is 51, discussed with Hanna, PA-C, and clarified dosage PO route for kayexalate.  Patient will receive only 30g of kayexalate PO, and will be started on dextrose infusion.

## 2014-03-30 NOTE — ED Notes (Signed)
Spoke to [place] in pharmacy in regards to [employer] dosage.  Will clarify with provider.

## 2014-03-30 NOTE — ED Provider Notes (Signed)
Medical screening examination/treatment/procedure(s) were conducted as a shared visit with non-physician practitioner(s) and myself.  I personally evaluated the patient during the encounter.   EKG Interpretation   Date/Time:  Tuesday March 30 2014 19:43:07 EDT Ventricular Rate:  82 PR Interval:  186 QRS Duration: 86 QT Interval:  408 QTC Calculation: 476 R Axis:   -17 Text Interpretation:  Normal sinus rhythm Left ventricular hypertrophy  Inferior infarct , age undetermined Anterolateral infarct , age  undetermined Abnormal ECG No significant change since last tracing  Confirmed by Shermaine Brigham,  DO, [place] Hijazi [id number]) on 03/30/2014 8:05:25 PM      Pt is a 73 y.o. F who presents the emergency department after missing dialysis. She is hyperkalemic, appears confused per her neighbor, slightly hypoxic in volume overload. Will consult nephrology. We'll admit to medicine.  Murphy, DO 03/30/14 2148

## 2014-03-30 NOTE — ED Notes (Signed)
CBG is 45. Will administer dextrose from PRN orders and notify MD of downward trend.

## 2014-03-30 NOTE — ED Notes (Addendum)
Pt is a dialysis pt. Last dialysis was a week ago today. Pt has dialysis tues, thrus, sat. Pt had toe amputation last Thursday. Pt denies pain. Pt/friend reports weakness, body swelling, and tremors.

## 2014-03-30 NOTE — ED Notes (Signed)
Jarrett Soho, PA-C, at the bedside to start EJ.  Reported to her patient's labs and status, K+ of 6.2 with last dialysis on Thursday. Patient reported that during dialysis she couldn't walk while there.  2nd toe amputation on right foot assessed by Jarrett Soho, and redressed by RN.

## 2014-03-30 NOTE — ED Provider Notes (Signed)
CSN: [id number]     Arrival date & time 03/30/14  1928 History   First MD Initiated Contact with Patient 03/30/14 1954     Chief Complaint  Patient presents with  . Fatigue  . Tremors  . Altered Mental Status  . Edema     (Consider location/radiation/quality/duration/timing/severity/associated sxs/prior Treatment) The history is provided by the patient, a friend and medical records. No language interpreter was used.    [name] [name] is a 73 y.o. female  with a hx of diabetes, gout, end-stage renal disease on hemodialysis, chronic pain presents to the Emergency Department complaining of gradual, persistent, progressively worsening confusion, fatigue onset 3 days ago.  Pt reports she had her right 2nd toe amputated 1 week ago and has been unable to make it to dialysis for the last week; missing 3 dialysis session.  Associated symptoms include swelling of her legs, generalized weakness, shortness of breath.  Patient reports she has been caring for her toe as directed.  Nothing makes this conglomeration of symptoms better and exertion makes them worse. Patient denies fever, chills, headache, neck pain, chest pain, abdominal pain, nausea, vomiting, diarrhea, dysuria. Patient reports she does make urine but is unsure how much. Patient's friend accompanies her to the emergency department states that she has been falling a lot at home due to her weakness. She reports that today when attempting to help [name] [name] stand she noted that there were tremors but denied seizure activity.  Past Medical History  Diagnosis Date  . HYPOTHYROIDISM 02/17/2007    s/p surgical removal of goiter in 1997  . DIABETES MELLITUS, TYPE II 02/17/2007  . HYPERLIPIDEMIA 02/17/2007  . GOUT 05/29/2007  . ANEMIA-NOS 05/29/2007  . ANXIETY 03/23/2010  . 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 DEPRESSION 02/17/2007  . RESTLESS LEG SYNDROME 05/29/2007  . COMMON MIGRAINE 05/29/2007  . PERIPHERAL NEUROPATHY 05/29/2007  . HYPERTENSION  02/17/2007  . PERIPHERAL VASCULAR DISEASE 02/17/2007  . PEPTIC ULCER DISEASE 05/29/2007  . Chronic kidney disease 02/17/2007    Followed by Dr. Florene Glen  . OSTEOPENIA 09/22/2009  . SEIZURE DISORDER 02/17/2007  . Memory loss 01/24/2010  . Personal history of colonic polyps 11/16/2009  . Palpitations 09/08/2010  . Other diseases of vocal cords 1996  . HEART MURMUR, HX OF 10/02/2010  . 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 Chronic neck pain 12/13/2010  . GERD (gastroesophageal reflux disease)   . 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 Cancer of kidney 10/07/2012    Followed per Dr Despina Pole, MD, urology, [place]   . Arthritis   . Critical lower limb ischemia    Past Surgical History  Procedure Laterality Date  . Left toe amputated  2006  . Bunionectomy  1980  . Goiter removal  1997  . Stress cardiolite  06/18/2006  . Tranthoracic echocardiogram  06/18/2006  . Electrocardiogram  05/29/2006  . Cholecystectomy    . Rotator cuff repair  Left    Dr. Sharol Given  . Av fistula placement  03/13/2012    Procedure: ARTERIOVENOUS (AV) FISTULA CREATION;  Surgeon: Conrad Broken Arrow, MD;  Location: [place];  Service: Vascular;  Laterality: Right;  . Eye surgery      bilateral cataract removal  . Esophagoscopy w/ botox injection  07/22/2012    Procedure: ESOPHAGOSCOPY WITH BOTOX INJECTION;  Surgeon: Rozetta Nunnery, MD;  Location: [place];  Service: ENT;  Laterality: N/A;  esophageal dilation  .  Insertion of dialysis catheter N/A 02/05/2013    Procedure: INSERTION OF DIALYSIS CATHETER;  Surgeon: Angelia Mould, MD;  Location: [facility] OR;  Service: Vascular;  Laterality: N/A;  Ultrasound guided   Family History  Problem Relation Age of Onset  . Dementia Mother   . Hypertension Mother   . Coronary artery disease Other   . Hyperlipidemia Other   . Hypertension Other   . Ovarian cancer Other   . Stroke Other   . Hypertension Sister   . Hypertension  Brother   . Heart attack Brother   . Stroke Brother    History  Substance Use Topics  . Smoking status: Former Smoker -- 0.25 packs/day for 47 years    Types: Cigarettes  . Smokeless tobacco: Never Used     Comment: USING E CIG  . Alcohol Use: No   OB History   Grav Para Term Preterm Abortions TAB SAB Ect Mult Living                 Review of Systems  Constitutional: Positive for fatigue. Negative for fever, diaphoresis, appetite change and unexpected weight change.  HENT: Negative for mouth sores.   Eyes: Negative for visual disturbance.  Respiratory: Positive for shortness of breath. Negative for cough, chest tightness and wheezing.   Cardiovascular: Positive for leg swelling. Negative for chest pain.  Gastrointestinal: Negative for nausea, vomiting, abdominal pain, diarrhea and constipation.  Endocrine: Negative for polydipsia, polyphagia and polyuria.  Genitourinary: Negative for dysuria, urgency, frequency and hematuria.  Musculoskeletal: Negative for back pain and neck stiffness.  Skin: Negative for rash.  Allergic/Immunologic: Negative for immunocompromised state.  Neurological: Positive for weakness. Negative for syncope, light-headedness and headaches.  Hematological: Does not bruise/bleed easily.  Psychiatric/Behavioral: Positive for confusion. Negative for sleep disturbance. The patient is not nervous/anxious.       Allergies  Cephalosporins; Nsaids; Pioglitazone; and Vancomycin  Home Medications   Prior to Admission medications   Medication Sig Start Date End Date Taking? Authorizing Provider  albuterol (PROVENTIL HFA;VENTOLIN HFA) 108 (90 BASE) MCG/ACT inhaler Inhale 2 puffs into the lungs every 6 (six) hours as needed for wheezing or shortness of breath. 10/14/13  Yes Biagio Borg, MD  allopurinol (ZYLOPRIM) 100 MG tablet Take 100 mg by mouth daily.  01/04/12  Yes Historical Provider, MD  aspirin EC 81 MG tablet Take 81 mg by mouth daily.   Yes Historical  Provider, MD  atorvastatin (LIPITOR) 40 MG tablet Take 1 tablet (40 mg total) by mouth daily. 04/22/13  Yes Biagio Borg, MD  clindamycin (CLEOCIN) 300 MG capsule Take 1 capsule (300 mg total) by mouth 3 (three) times daily. 03/19/14  Yes Bronson Ing, DPM  clonazePAM (KLONOPIN) 2 MG tablet Take 2 mg by mouth 2 (two) times daily as needed for anxiety.   Yes Historical Provider, MD  cyclobenzaprine (FLEXERIL) 10 MG tablet Take 10 mg by mouth 3 (three) times daily as needed for muscle spasms.    Yes Historical Provider, MD  dextromethorphan-guaiFENesin (MUCINEX DM) 30-600 MG per 12 hr tablet Take 1 tablet by mouth 2 (two) times daily as needed for cough.    Yes Historical Provider, MD  docusate sodium (COLACE) 100 MG capsule Take 100 mg by mouth 2 (two) times daily as needed for mild constipation.    Yes Historical Provider, MD  DULoxetine (CYMBALTA) 30 MG capsule Take 1 capsule (30 mg total) by mouth daily. 12/09/13  Yes Biagio Borg, MD  HYDROcodone-acetaminophen (  NORCO/VICODIN) 5-325 MG per tablet Take 1 tablet by mouth every 4 (four) hours as needed for moderate pain.   Yes Historical Provider, MD  hydrOXYzine (ATARAX/VISTARIL) 25 MG tablet Take 25 mg by mouth every 6 (six) hours as needed for anxiety.   Yes Historical Provider, MD  lactulose (CHRONULAC) 10 GM/15ML solution Take 20 g by mouth daily.   Yes Historical Provider, MD  levothyroxine (SYNTHROID, LEVOTHROID) 125 MCG tablet TAKE ONE TABLET BY MOUTH ONCE DAILY   Yes Biagio Borg, MD  montelukast (SINGULAIR) 10 MG tablet TAKE ONE TABLET BY MOUTH AT BEDTIME   Yes Biagio Borg, MD  multivitamin (RENA-VIT) TABS tablet Take 1 tablet by mouth daily.   Yes Historical Provider, MD  omeprazole (PRILOSEC) 40 MG capsule Take 40 mg by mouth daily.  02/15/12  Yes Historical Provider, MD  promethazine (PHENERGAN) 25 MG tablet Take 1 tablet (25 mg total) by mouth every 6 (six) hours as needed for nausea. 10/23/13  Yes Biagio Borg, MD  QUEtiapine (SEROQUEL)  100 MG tablet TAKE ONE TABLET BY MOUTH AT BEDTIME 03/01/14  Yes Biagio Borg, MD  cyproheptadine (PERIACTIN) 4 MG tablet Take 1 tablet (4 mg total) by mouth 2 (two) times daily. 01/05/13   Biagio Borg, MD  mometasone-formoterol (DULERA) 100-5 MCG/ACT AERO Inhale 2 puffs into the lungs 2 (two) times daily. 03/24/14   Biagio Borg, MD  oxyCODONE-acetaminophen (PERCOCET) 7.5-325 MG per tablet Take 1 tablet by mouth every 4 (four) hours as needed for pain. 03/26/14   Bronson Ing, DPM   BP 103/54  Pulse 75  Temp(Src) 98.5 F (36.9 C) (Oral)  Resp 18  Ht 5\' 7"  (1.702 m)  Wt 144 lb (65.318 kg)  BMI 22.55 kg/m2  SpO2 93% Physical Exam  Nursing note and vitals reviewed. Constitutional: She appears well-developed and well-nourished. No distress.  Awake, alert, nontoxic appearance  HENT:  Head: Normocephalic and atraumatic.  Nose: Nose normal.  Mouth/Throat: Oropharynx is clear and moist. No oropharyngeal exudate.  Eyes: Conjunctivae are normal. No scleral icterus.  Neck: Normal range of motion. Neck supple.  Cardiovascular: Normal rate, regular rhythm, normal heart sounds and intact distal pulses.   Pulmonary/Chest: Effort normal. No respiratory distress. She has no wheezes. She has rales (bilateral bases).  Equal chest expansion  Abdominal: Soft. Bowel sounds are normal. She exhibits no mass. There is no tenderness. There is no rebound and no guarding.  Musculoskeletal: Normal range of motion. She exhibits edema (bilateral lower extremities).  Amputation of right 2nd toe with clean wound base, no purulent drainage, no erythema, swelling or increased warmth  Lymphadenopathy:    She has no cervical adenopathy.  Neurological: She is alert.  Speech is clear and goal oriented Moves extremities without ataxia  Skin: Skin is warm and dry. She is not diaphoretic. No erythema.  Psychiatric: She has a normal mood and affect.    ED Course  Procedures (including critical care time) Labs  Review Labs Reviewed  CBC - Abnormal; Notable for the following:    RBC 3.28 (*)    Hemoglobin 9.9 (*)    HCT 29.4 (*)    RDW 15.9 (*)    All other components within normal limits  COMPREHENSIVE METABOLIC PANEL - Abnormal; Notable for the following:    Potassium 6.2 (*)    BUN 58 (*)    Creatinine, Ser 13.34 (*)    Albumin 3.4 (*)    GFR calc non Af Amer 2 (*)  GFR calc Af Amer 3 (*)    Anion gap 18 (*)    All other components within normal limits  URINALYSIS, ROUTINE W REFLEX MICROSCOPIC - Abnormal; Notable for the following:    Color, Urine AMBER (*)    APPearance CLOUDY (*)    Bilirubin Urine SMALL (*)    Protein, ur 100 (*)    Leukocytes, UA MODERATE (*)    All other components within normal limits  I-STAT CHEM 8, ED - Abnormal; Notable for the following:    Sodium 135 (*)    Potassium 6.0 (*)    BUN 55 (*)    Creatinine, Ser 14.00 (*)    Calcium, Ion 1.04 (*)    Hemoglobin 10.9 (*)    HCT 32.0 (*)    All other components within normal limits  CBG MONITORING, ED - Abnormal; Notable for the following:    Glucose-Capillary 54 (*)    All other components within normal limits  CBG MONITORING, ED - Abnormal; Notable for the following:    Glucose-Capillary 51 (*)    All other components within normal limits  CBG MONITORING, ED - Abnormal; Notable for the following:    Glucose-Capillary 45 (*)    All other components within normal limits  CBG MONITORING, ED - Abnormal; Notable for the following:    Glucose-Capillary 166 (*)    All other components within normal limits  URINE CULTURE  URINE MICROSCOPIC-ADD ON  HEMOGLOBIN A1C  CBG MONITORING, ED    Imaging Review Dg Chest Portable 1 View  03/30/2014   CLINICAL DATA:  Shortness of breath.  Dialysis patient.  EXAM: PORTABLE CHEST - 1 VIEW  COMPARISON:  02/05/2013.  FINDINGS: The right jugular catheter has been removed. Surgical clips are noted at the thoracic inlet and in the lower neck bilaterally. Decreased  inspiration with bibasilar atelectasis. Borderline enlarged cardiac silhouette. Stable mild prominence of the interstitial markings with mild diffuse peribronchial thickening. Extensive left shoulder degenerative changes are again demonstrated.  IMPRESSION: 1. Poor inspiration with bibasilar atelectasis. 2. Stable chronic bronchitic changes.   Electronically Signed   By: Enrique Sack M.D.   On: 03/30/2014 21:50     EKG Interpretation   Date/Time:  Tuesday March 30 2014 19:43:07 EDT Ventricular Rate:  82 PR Interval:  186 QRS Duration: 86 QT Interval:  408 QTC Calculation: 476 R Axis:   -17 Text Interpretation:  Normal sinus rhythm Left ventricular hypertrophy  Inferior infarct , age undetermined Anterolateral infarct , age  undetermined Abnormal ECG No significant change since last tracing  Confirmed by Kenna Gilbert, KRISTEN [phone]) on 03/30/2014 8:05:25 PM      Angiocath insertion Performed by: Abigail Butts  Consent: Verbal consent obtained. Risks and benefits: risks, benefits and alternatives were discussed Time out: Immediately prior to procedure a "time out" was called to verify the correct patient, procedure, equipment, support staff and site/side marked as required.  Preparation: Patient was prepped and draped in the usual sterile fashion.  Vein Location: Left EJ  Not Ultrasound Guided  Gauge: 18ga  Normal blood return and flush without difficulty Patient tolerance: Patient tolerated the procedure well with no immediate complications.     MDM   Final diagnoses:  Hyperkalemia  ESRD (end stage renal disease) on dialysis  Chronic obstructive pulmonary disease, unspecified COPD, unspecified chronic bronchitis type    Anniece L [name] presents with weakness, confusion after missing 1 week of dialysis.  Pt with recent toe amputation, no evidence of infection.  Hyperkalemia at 6.2; rales in lung bases, pitting edema of the lower extremities; pt with fluid overload.   Patient is unable to tell me how much urine she makes. She is a poor historian and appears sleepy.    No tachycardia, fever or hypotension.  No significant hypertension.  Elevated BUN and creatinine as expected after missing dialysis.  Will temporize potassium with insulin, D50 and calcium.  Nephrology consult pending.  10:50 PM Discussed with Dr. Jimmy Footman who recommends Kayexalate and hospitalist admission with morning dialysis.    Pt CBG 54.  Will PO trial and recheck.    11:19 PM Discussed with Dr. Arnoldo Morale who will admit to tele bed.    11:45pm Patient blood sugar continued to decrease her by mouth intake. We'll begin D10 infusion.    BP 98/59  Pulse 69  Temp(Src) 98.5 F (36.9 C) (Oral)  Resp 13  Ht 5\' 7"  (1.702 m)  Wt 144 lb (65.318 kg)  BMI 22.55 kg/m2  SpO2 94%  The patient was discussed with and seen by Dr. Leonides Schanz who agrees with the treatment plan.   Jarrett Soho Nusayba Cadenas, PA-C 03/31/14 [phone]

## 2014-03-30 NOTE — ED Notes (Signed)
Reported CBG of 54 to [place], [place], after patient has received 10 units of novolog, dextrose amp, and calcuim gluconate for K+.  Patient is still alert, but "tired" feeling.  Also, friend is at the bedside and is concerned about lack of care at home.  Discussed with Gwenlyn Perking, she can have social work consult on the floor when admitted. Patient is given orange juice now and informed of recheck of CBG in 20 minutes.

## 2014-03-30 NOTE — ED Notes (Signed)
Reported to Dr. Leonides Schanz that the patient has K+ level of 6.2, and attempting to obtain IV access.  Patient reports being unable to walk since dialysis on Thursday from being weak. MD acknowledges, and advises to have ultrasound at the bedside for access. No new orders received.

## 2014-03-30 NOTE — ED Notes (Signed)
Patient reports her last dialysis was last thursay.

## 2014-03-30 NOTE — ED Notes (Signed)
Portable chest x-ray at the bedside.

## 2014-03-30 NOTE — ED Notes (Signed)
Called pharmacy for calcium gluconate.

## 2014-03-31 ENCOUNTER — Encounter (HOSPITAL_COMMUNITY): Payer: Self-pay | Admitting: *Deleted

## 2014-03-31 LAB — BASIC METABOLIC PANEL
Anion gap: 18 — ABNORMAL HIGH (ref 5–15)
BUN: 58 mg/dL — ABNORMAL HIGH (ref 6–23)
CO2: 25 mEq/L (ref 19–32)
Calcium: 8.6 mg/dL (ref 8.4–10.5)
Chloride: 95 mEq/L — ABNORMAL LOW (ref 96–112)
Creatinine, Ser: 13.21 mg/dL — ABNORMAL HIGH (ref 0.50–1.10)
GFR calc Af Amer: 3 mL/min — ABNORMAL LOW (ref >90)
GFR calc non Af Amer: 2 mL/min — ABNORMAL LOW (ref >90)
Glucose, Bld: 115 mg/dL — ABNORMAL HIGH (ref 70–99)
Potassium: 6.9 mEq/L (ref 3.7–5.3)
Sodium: 138 mEq/L (ref 137–147)

## 2014-03-31 LAB — GLUCOSE, CAPILLARY
Glucose-Capillary: 87 mg/dL (ref 70–99)
Glucose-Capillary: 133 mg/dL — ABNORMAL HIGH (ref 70–99)

## 2014-03-31 LAB — CBC
HCT: 32.1 % — ABNORMAL LOW (ref 36.0–46.0)
Hemoglobin: 10.6 g/dL — ABNORMAL LOW (ref 12.0–15.0)
MCH: 30.2 pg (ref 26.0–34.0)
MCHC: 33 g/dL (ref 30.0–36.0)
MCV: 91.5 fL (ref 78.0–100.0)
Platelets: 282 10*3/uL (ref 150–400)
RBC: 3.51 MIL/uL — ABNORMAL LOW (ref 3.87–5.11)
RDW: 16.1 % — ABNORMAL HIGH (ref 11.5–15.5)
WBC: 7.8 10*3/uL (ref 4.0–10.5)

## 2014-03-31 LAB — CBG MONITORING, ED
Glucose-Capillary: 166 mg/dL — ABNORMAL HIGH (ref 70–99)
Glucose-Capillary: 45 mg/dL — ABNORMAL LOW (ref 70–99)

## 2014-03-31 LAB — HEPATITIS B SURFACE ANTIGEN: Hepatitis B Surface Ag: NEGATIVE

## 2014-03-31 LAB — PHOSPHORUS: Phosphorus: 6 mg/dL — ABNORMAL HIGH (ref 2.3–4.6)

## 2014-03-31 LAB — HEMOGLOBIN A1C
Hgb A1c MFr Bld: 6.1 % — ABNORMAL HIGH (ref <5.7)
Mean Plasma Glucose: 128 mg/dL — ABNORMAL HIGH (ref <117)

## 2014-03-31 MED ORDER — DARBEPOETIN ALFA-POLYSORBATE 25 MCG/0.42ML IJ SOLN
25.0000 ug | INTRAMUSCULAR | Status: DC
  Administered 2014-04-01: 25 ug via INTRAVENOUS
  Filled 2014-03-31: qty 0.42

## 2014-03-31 MED ORDER — HEPARIN SODIUM (PORCINE) 1000 UNIT/ML DIALYSIS
2200.0000 [IU] | Freq: Once | INTRAMUSCULAR | Status: DC
  Filled 2014-03-31: qty 3

## 2014-03-31 MED ORDER — OXYCODONE HCL 5 MG PO TABS
5.0000 mg | ORAL_TABLET | ORAL | Status: DC | PRN
  Administered 2014-03-31 – 2014-04-05 (×9): 5 mg via ORAL
  Filled 2014-03-31 (×9): qty 1

## 2014-03-31 MED ORDER — SODIUM CHLORIDE 0.9 % IV SOLN: 100.0000 mL | INTRAVENOUS | Status: DC | PRN

## 2014-03-31 MED ORDER — HEPARIN SODIUM (PORCINE) 1000 UNIT/ML DIALYSIS
2200.0000 [IU] | Freq: Once | INTRAMUSCULAR | Status: DC
Start: 2014-03-31 — End: 2014-03-31
  Filled 2014-03-31: qty 3

## 2014-03-31 MED ORDER — LIDOCAINE-PRILOCAINE 2.5-2.5 % EX CREA: 1.0000 "application " | TOPICAL_CREAM | CUTANEOUS | Status: DC | PRN

## 2014-03-31 MED ORDER — ONDANSETRON HCL 4 MG PO TABS
4.0000 mg | ORAL_TABLET | Freq: Four times a day (QID) | ORAL | Status: DC | PRN
  Administered 2014-04-04: 4 mg via ORAL
  Filled 2014-03-31: qty 1

## 2014-03-31 MED ORDER — HEPARIN SODIUM (PORCINE) 1000 UNIT/ML DIALYSIS
1000.0000 [IU] | INTRAMUSCULAR | Status: DC | PRN
  Filled 2014-03-31: qty 1

## 2014-03-31 MED ORDER — ALTEPLASE 2 MG IJ SOLR
2.0000 mg | Freq: Once | INTRAMUSCULAR | Status: DC | PRN
  Filled 2014-03-31: qty 2

## 2014-03-31 MED ORDER — ALTEPLASE 2 MG IJ SOLR: 2.0000 mg | Freq: Once | INTRAMUSCULAR | Status: DC | PRN

## 2014-03-31 MED ORDER — PENTAFLUOROPROP-TETRAFLUOROETH EX AERO
1.0000 | INHALATION_SPRAY | CUTANEOUS | Status: DC | PRN
Start: 2014-03-31 — End: 2014-03-31

## 2014-03-31 MED ORDER — INSULIN ASPART 100 UNIT/ML ~~LOC~~ SOLN
0.0000 [IU] | Freq: Three times a day (TID) | SUBCUTANEOUS | Status: DC
  Administered 2014-04-01: 1 [IU] via SUBCUTANEOUS

## 2014-03-31 MED ORDER — LIDOCAINE HCL (PF) 1 % IJ SOLN: 5.0000 mL | INTRAMUSCULAR | Status: DC | PRN

## 2014-03-31 MED ORDER — SEVELAMER CARBONATE 800 MG PO TABS
2400.0000 mg | ORAL_TABLET | Freq: Three times a day (TID) | ORAL | Status: DC
  Administered 2014-03-31 – 2014-04-05 (×15): 2400 mg via ORAL
  Filled 2014-03-31 (×18): qty 3

## 2014-03-31 MED ORDER — ENOXAPARIN SODIUM 30 MG/0.3ML ~~LOC~~ SOLN
30.0000 mg | SUBCUTANEOUS | Status: DC
  Administered 2014-03-31 – 2014-04-05 (×6): 30 mg via SUBCUTANEOUS
  Filled 2014-03-31 (×6): qty 0.3

## 2014-03-31 MED ORDER — SODIUM CHLORIDE 0.9 % IV SOLN: 250.0000 mL | INTRAVENOUS | Status: DC | PRN

## 2014-03-31 MED ORDER — HYDROMORPHONE HCL PF 1 MG/ML IJ SOLN
0.5000 mg | INTRAMUSCULAR | Status: DC | PRN
  Administered 2014-04-01: 0.5 mg via INTRAVENOUS
  Filled 2014-03-31: qty 1

## 2014-03-31 MED ORDER — ACETAMINOPHEN 325 MG PO TABS
650.0000 mg | ORAL_TABLET | Freq: Four times a day (QID) | ORAL | Status: DC | PRN
  Administered 2014-04-01 (×2): 650 mg via ORAL
  Filled 2014-03-31 (×2): qty 2

## 2014-03-31 MED ORDER — INSULIN ASPART 100 UNIT/ML ~~LOC~~ SOLN: 0.0000 [IU] | Freq: Every day | SUBCUTANEOUS | Status: DC

## 2014-03-31 MED ORDER — SODIUM CHLORIDE 0.9 % IJ SOLN
3.0000 mL | Freq: Two times a day (BID) | INTRAMUSCULAR | Status: DC
  Administered 2014-03-31 – 2014-04-02 (×4): 3 mL via INTRAVENOUS
  Administered 2014-04-03 (×2): via INTRAVENOUS
  Administered 2014-04-03 – 2014-04-04 (×2): 3 mL via INTRAVENOUS
  Administered 2014-04-05: 11:00:00 via INTRAVENOUS
  Administered 2014-04-05: 3 mL via INTRAVENOUS

## 2014-03-31 MED ORDER — SODIUM CHLORIDE 0.9 % IJ SOLN
3.0000 mL | Freq: Two times a day (BID) | INTRAMUSCULAR | Status: DC
  Administered 2014-04-02 – 2014-04-04 (×4): 3 mL via INTRAVENOUS

## 2014-03-31 MED ORDER — NEPRO/CARBSTEADY PO LIQD: 237.0000 mL | ORAL | Status: DC | PRN

## 2014-03-31 MED ORDER — DOXERCALCIFEROL 4 MCG/2ML IV SOLN
4.0000 ug | INTRAVENOUS | Status: DC
  Administered 2014-04-01 – 2014-04-03 (×2): 4 ug via INTRAVENOUS
  Filled 2014-03-31 (×2): qty 2

## 2014-03-31 MED ORDER — NEPRO/CARBSTEADY PO LIQD
237.0000 mL | ORAL | Status: DC | PRN
  Filled 2014-03-31: qty 237

## 2014-03-31 MED ORDER — ACETAMINOPHEN 650 MG RE SUPP: 650.0000 mg | Freq: Four times a day (QID) | RECTAL | Status: DC | PRN

## 2014-03-31 MED ORDER — HEPARIN SODIUM (PORCINE) 1000 UNIT/ML DIALYSIS: 1000.0000 [IU] | INTRAMUSCULAR | Status: DC | PRN

## 2014-03-31 MED ORDER — ONDANSETRON HCL 4 MG/2ML IJ SOLN
4.0000 mg | Freq: Four times a day (QID) | INTRAMUSCULAR | Status: DC | PRN
  Administered 2014-04-01 – 2014-04-03 (×3): 4 mg via INTRAVENOUS
  Filled 2014-03-31 (×4): qty 2

## 2014-03-31 MED ORDER — PENTAFLUOROPROP-TETRAFLUOROETH EX AERO: 1.0000 "application " | INHALATION_SPRAY | CUTANEOUS | Status: DC | PRN

## 2014-03-31 MED ORDER — DEXTROSE 50 % IV SOLN
1.0000 | Freq: Once | INTRAVENOUS | Status: AC
  Administered 2014-03-31: 50 mL via INTRAVENOUS

## 2014-03-31 MED ORDER — SODIUM CHLORIDE 0.9 % IJ SOLN: 3.0000 mL | INTRAMUSCULAR | Status: DC | PRN

## 2014-03-31 NOTE — Procedures (Signed)
I was present at this dialysis session, have reviewed the session itself and made  appropriate changes  Kelly Splinter MD (pgr) [phone]    [phone] 03/31/2014, 4:45 PM

## 2014-03-31 NOTE — ED Notes (Signed)
Reported to Dr. Arnoldo Morale that patient's sugar was 45, given an amp of dextrose with D10 still infusing. During phone call, rechecked patient's blood glucose, reading of 166. MD advises that held orders will cover hypoglycemia during admission. Will now prepare to transfer.

## 2014-03-31 NOTE — ED Provider Notes (Signed)
Medical screening examination/treatment/procedure(s) were performed by non-physician practitioner and as supervising physician I was immediately available for consultation/collaboration.   EKG Interpretation   Date/Time:  Tuesday March 30 2014 19:43:07 EDT Ventricular Rate:  82 PR Interval:  186 QRS Duration: 86 QT Interval:  408 QTC Calculation: 476 R Axis:   -17 Text Interpretation:  Normal sinus rhythm Left ventricular hypertrophy  Inferior infarct , age undetermined Anterolateral infarct , age  undetermined Abnormal ECG No significant change since last tracing  Confirmed by Syrah Daughtrey,  DO, Esly Selvage [phone]) on 03/30/2014 8:05:25 PM        Lozano, DO 03/31/14 1655

## 2014-03-31 NOTE — Consult Note (Signed)
Renal Service Consult Note [facility]  Rhona Fusilier Rogowski 03/31/2014 Sol Blazing Requesting Physician:  Dr Eliseo Squires  Reason for Consult:  ESRD pt with high K, gen weakness HPI: The patient is a 73 y.o. year-old female with hx DM2, HTN, ESRD on HD, PAD.  She had toe amp surgery last Thursday and missed Thurs and Sat HD last week.  Presented to ED weak, fatigues, SOB, confusion. Also leg swelling. K in ED high at 6.5, given kayexalate and IV meds.  Admitted by [facility] team.   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 DM, etc 01/13 - Lethargy, mult falls > AMS due to polypharmacy (seroquel and trazodone started 1 week earlier). Improved. CKD stage IV, fu with CKA 08/13 - Increased falls and weakness, pyuria, A/C renal failure > IVF"s, abx, AVF placed. R renal mass MRI showed solid renal mass, seen by urology.  09/13 - R arm second stage BVT surgery  ROS:  no cp, no sob  no abd pain  no jt pain  +post op R foot pain  no HA, blurred visions  +mild confusion off and on per her sister  no focal weakness Past Medical History  Past Medical History  Diagnosis Date  . HYPOTHYROIDISM 02/17/2007    s/p surgical removal of goiter in 1997  . DIABETES MELLITUS, TYPE II 02/17/2007  . HYPERLIPIDEMIA 02/17/2007  . GOUT 05/29/2007  . ANEMIA-NOS 05/29/2007  . ANXIETY 03/23/2010  . 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 DEPRESSION 02/17/2007  . RESTLESS LEG SYNDROME 05/29/2007  . COMMON MIGRAINE 05/29/2007  . PERIPHERAL NEUROPATHY 05/29/2007  . HYPERTENSION 02/17/2007  . PERIPHERAL VASCULAR DISEASE 02/17/2007  . PEPTIC ULCER DISEASE 05/29/2007  . Chronic kidney disease 02/17/2007    Followed by Dr. Florene Glen  .  OSTEOPENIA 09/22/2009  . SEIZURE DISORDER 02/17/2007  . Memory loss 01/24/2010  . Personal history of colonic polyps 11/16/2009  . Palpitations 09/08/2010  . Other diseases of vocal cords 1996  . HEART MURMUR, HX OF 10/02/2010  . 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 Chronic neck pain 12/13/2010  . GERD (gastroesophageal reflux disease)   . 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 Cancer of kidney 10/07/2012    Followed per Dr Despina Pole, MD, urology, [place]   . Arthritis   . Critical lower limb ischemia    Past Surgical History  Past Surgical History  Procedure Laterality Date  . Left toe amputated  2006  . Bunionectomy  1980  . Goiter removal  1997  . Stress cardiolite  06/18/2006  . Tranthoracic echocardiogram  06/18/2006  . Electrocardiogram  05/29/2006  . Cholecystectomy    . Rotator cuff repair  Left    Dr. Sharol Given  . Av fistula placement  03/13/2012    Procedure: ARTERIOVENOUS (AV) FISTULA CREATION;  Surgeon: Conrad Delta, MD;  Location: [facility];  Service: Vascular;  Laterality: Right;  . Eye surgery      bilateral cataract removal  . Esophagoscopy w/ botox injection  07/22/2012    Procedure: ESOPHAGOSCOPY WITH BOTOX INJECTION;  Surgeon: Rozetta Nunnery, MD;  Location: [facility];  Service: ENT;  Laterality: N/A;  esophageal dilation  . Insertion of dialysis catheter N/A 02/05/2013    Procedure: INSERTION OF DIALYSIS CATHETER;  Surgeon: Angelia Mould, MD;  Location: [facility] OR;  Service: Vascular;  Laterality: N/A;  Ultrasound guided   Family History  Family History  Problem Relation Age of Onset  . Dementia Mother   . Hypertension Mother   . Coronary artery disease Other   . Hyperlipidemia Other   . Hypertension Other   . Ovarian cancer Other   . Stroke Other   . Hypertension Sister   . Hypertension Brother   . Heart attack Brother   . Stroke Brother    Social History  reports that she has quit smoking. Her  smoking use included Cigarettes. She has a 11.75 pack-year smoking history. She has never used smokeless tobacco. She reports that she does not drink alcohol or use illicit drugs. Allergies  Allergies  Allergen Reactions  . Cephalosporins Itching and Rash    Vanc and fortaz given at the same time in June for several doses at dialysis with systemic rash and itching; received zinacef 7/5 and had worseningsystemicrash/ itching and swelling of eyes - so unclear if allergic to either or both  . Nsaids     REACTION: renal dysfunction  . Pioglitazone     Actos REACTION: EDEMA  . Vancomycin     See comment under cephalosporin   Home medications Prior to Admission medications   Medication Sig Start Date End Date Taking? Authorizing Provider  albuterol (PROVENTIL HFA;VENTOLIN HFA) 108 (90 BASE) MCG/ACT inhaler Inhale 2 puffs into the lungs every 6 (six) hours as needed for wheezing or shortness of breath. 10/14/13  Yes Biagio Borg, MD  allopurinol (ZYLOPRIM) 100 MG tablet Take 100 mg by mouth daily.  01/04/12  Yes Historical Provider, MD  aspirin EC 81 MG tablet Take 81 mg by mouth daily.   Yes Historical Provider, MD  atorvastatin (LIPITOR) 40 MG tablet Take 1 tablet (40 mg total) by mouth daily. 04/22/13  Yes Biagio Borg, MD  clindamycin (CLEOCIN) 300 MG capsule Take 1 capsule (300 mg total) by mouth 3 (three) times daily. 03/19/14  Yes Bronson Ing, DPM  clonazePAM (KLONOPIN) 2 MG tablet Take 2 mg by mouth 2 (two) times daily as needed for anxiety.   Yes Historical Provider, MD  cyclobenzaprine (FLEXERIL) 10 MG tablet Take 10 mg by mouth 3 (three) times daily as needed for muscle spasms.    Yes Historical Provider, MD  dextromethorphan-guaiFENesin (MUCINEX DM) 30-600 MG per 12 hr tablet Take 1 tablet by mouth 2 (two) times daily as needed for cough.    Yes Historical Provider, MD  docusate sodium (COLACE) 100 MG capsule Take 100 mg by mouth 2 (two) times daily as needed for mild constipation.     Yes Historical Provider, MD  DULoxetine (CYMBALTA) 30 MG capsule Take 1 capsule (30 mg total) by mouth daily. 12/09/13  Yes Biagio Borg, MD  HYDROcodone-acetaminophen (NORCO/VICODIN) 5-325 MG per tablet Take 1 tablet by mouth every 4 (four) hours as needed for moderate pain.   Yes Historical Provider, MD  hydrOXYzine (ATARAX/VISTARIL) 25 MG tablet Take 25 mg by mouth every 6 (six) hours as needed for anxiety.   Yes Historical Provider, MD  lactulose (CHRONULAC) 10 GM/15ML solution Take 20 g by mouth daily.   Yes Historical Provider, MD  levothyroxine (SYNTHROID, LEVOTHROID) 125 MCG tablet TAKE ONE TABLET BY MOUTH ONCE DAILY   Yes Biagio Borg, MD  montelukast (SINGULAIR) 10 MG tablet TAKE ONE TABLET BY MOUTH AT BEDTIME   Yes Biagio Borg, MD  multivitamin (RENA-VIT) TABS tablet Take 1 tablet by mouth daily.   Yes Historical Provider, MD  omeprazole (PRILOSEC) 40 MG capsule Take 40 mg by mouth daily.  02/15/12  Yes Historical Provider, MD  promethazine (PHENERGAN) 25 MG tablet Take 1 tablet (25 mg total) by mouth every 6 (six) hours as needed for nausea. 10/23/13  Yes Biagio Borg, MD  QUEtiapine (SEROQUEL) 100 MG tablet TAKE ONE TABLET BY MOUTH AT BEDTIME 03/01/14  Yes Biagio Borg, MD  cyproheptadine (PERIACTIN) 4 MG tablet Take 1 tablet (4 mg total) by mouth 2 (two) times daily. 01/05/13   Biagio Borg, MD  mometasone-formoterol (DULERA) 100-5 MCG/ACT AERO Inhale 2 puffs into the lungs 2 (two) times daily. 03/24/14   Biagio Borg, MD  oxyCODONE-acetaminophen (PERCOCET) 7.5-325 MG per tablet Take 1 tablet by mouth every 4 (four) hours as needed for pain. 03/26/14   Bronson Ing, DPM   Liver Function Tests  Recent Labs Lab 03/30/14 1939  AST 28  ALT <5  ALKPHOS 111  BILITOT 0.3  PROT 7.6  ALBUMIN 3.4*   No results found for this basename: LIPASE, AMYLASE,  in the last 168 hours CBC  Recent Labs Lab 03/30/14 1939 03/30/14 2002  WBC 8.2  --   HGB 9.9* 10.9*  HCT 29.4* 32.0*  MCV 89.6   --   PLT 280  --    Basic Metabolic Panel  Recent Labs Lab 03/30/14 1939 03/30/14 2002  NA 139 135*  K 6.2* 6.0*  CL 96 100  CO2 25  --   GLUCOSE 80 77  BUN 58* 55*  CREATININE 13.34* 14.00*  CALCIUM 9.0  --     Filed Vitals:   03/31/14 0000 03/31/14 0030 03/31/14 0100 03/31/14 0536  BP: 103/54 98/52 98/59  104/56  Pulse: 75 71 69 109  Temp:    97.7 F (36.5 C)  TempSrc:    Oral  Resp: 18 13  18   Height:      Weight:      SpO2: 93% 93% 94% 97%   Exam A little sluggish, but coherent and oriented No rash, cyanosis or gangrene Sclera anicteric, throat clear +JVD Chest bilat exp wheezing, bilat rales 1/3 up RRR no MRG Abd soft, mod distended, tympanitic, nontender, +BS Mild R pretib edema, R foot wrapped.  Old left toe/ray amps, no other edema Neuro +mild myoclonic jerking, Ox 3, nonfocal  CXR 8.25 poor inspiration, bibasilar atx, no edema  HD: [facility] 3h 66min   69kg   300/600   2/2.25 [place]  P4  RUA AVF   Heparin 2200 Hectorol 4 ug TIW,   Aranesp 25 ug q Thurs  Assessment: 1 Falls / gen weakness / asterixis - due to uremia from missed HD 2 Recent R 2nd toe amp - done last week at [facility] 3 Hyperkalemia 4 ESRD on HD 5 Volume - below dry wt, BP's low and CXR clear 6 Chronic pain / psych - on seroquel, percocet, Cymbalta, Flexeril, Klonopin, vicodin per home med list 7 Anemia cont aranesp 25 / wk 8 HPTH cont renvela 3ac, vit D 9 PAD w prior toe amps  Plan- HD today and again tomorrow  Kelly Splinter MD (pgr) [phone]    [phone] 03/31/2014, 9:19 AM

## 2014-03-31 NOTE — Progress Notes (Addendum)
PROGRESS NOTE  [name] [name] IDP:[id number] DOB: Jul 09, 1941 DOA: 03/30/2014 PCP: Cathlean Cower, MD  [name] [name] is a 73 y.o. female with a history of ESRD on HD, Seizures Disorder, HTN, DM2, Hyperlipidemia, Hypothyroid, Anemia who presents to the ED with complaints of increasing confusion for the past few days. She had a recent amputation of her right second toe 5 days ago and has missed a total of 3 Dialysis treatments. She reports that she takes the medical transportation to her dialysis treatments but was unable to walk out to the [place]. A family friend was checking on her and found her to be confused and have increased weakness and inability to care for herself and had her brought to the ED.   Assessment/Plan: Hyperkalemia  Monitor on Telemetry  Given Kayexalate 30 gms PO x1  And IV Dextrose, Insulin, Calcium Gluconate  And 1 amp IV Bicarb  Dialysis    Acute Encephalopathy- Most Likely due to Uremia  Appears to be back to baseline ?from missed dialysis- can not get there post surgery- PT eval  Hypoglycemia  Monitor Glucose Levels  SSI   ESRD on dialysis  Dialysis   PERIPHERAL VASCULAR DISEASE- Recent 2nd Toe Amputation  Wound Care Evaluation   SEIZURE DISORDER   Seizure Meds   COPD (chronic obstructive pulmonary disease)  DUONebs   Anemia of renal disease  Monitor Hb levels  Send Anemia Panel    HYPERTENSION   HYPERLIPIDEMIA  Continue Statin Rx    HYPOTHYROIDISM  Continue levothyroxine  Check TSH level   Code Status: full Family Communication: patient Disposition Plan:    Consultants:  renal  Procedures:      HPI/Subjective: No SOB, no CP  Objective: Filed Vitals:   03/31/14 0536  BP: 104/56  Pulse: 109  Temp: 97.7 F (36.5 C)  Resp: 18    Intake/Output Summary (Last 24 hours) at 03/31/14 1158 Last data filed at 03/31/14 0932  Gross per 24 hour  Intake 648.33 ml  Output    350 ml  Net 298.33 ml   Filed Weights   03/30/14 1934    Weight: 65.318 kg (144 lb)    Exam:   General:  On phone, declined to hang up  Cardiovascular: rrr  Respiratory: no crackles  Abdomen: +Bs, soft  Musculoskeletal:    Data Reviewed: Basic Metabolic Panel:  Recent Labs Lab 03/30/14 1939 03/30/14 2002 03/31/14 0900  NA 139 135* 138  K 6.2* 6.0* 6.9*  CL 96 100 95*  CO2 25  --  25  GLUCOSE 80 77 115*  BUN 58* 55* 58*  CREATININE 13.34* 14.00* 13.21*  CALCIUM 9.0  --  8.6   Liver Function Tests:  Recent Labs Lab 03/30/14 1939  AST 28  ALT <5  ALKPHOS 111  BILITOT 0.3  PROT 7.6  ALBUMIN 3.4*   No results found for this basename: LIPASE, AMYLASE,  in the last 168 hours No results found for this basename: AMMONIA,  in the last 168 hours CBC:  Recent Labs Lab 03/30/14 1939 03/30/14 2002 03/31/14 0900  WBC 8.2  --  7.8  HGB 9.9* 10.9* 10.6*  HCT 29.4* 32.0* 32.1*  MCV 89.6  --  91.5  PLT 280  --  282   Cardiac Enzymes: No results found for this basename: CKTOTAL, CKMB, CKMBINDEX, TROPONINI,  in the last 168 hours BNP (last 3 results) No results found for this basename: PROBNP,  in the last 8760 hours CBG:  Recent Labs Lab  03/30/14 2241 03/30/14 2305 03/30/14 2357 03/31/14 0020 03/31/14 0143  GLUCAP 54* 51* 45* 166* 87    No results found for this or any previous visit (from the past 240 hour(s)).   Studies: Dg Chest Portable 1 View  03/30/2014   CLINICAL DATA:  Shortness of breath.  Dialysis patient.  EXAM: PORTABLE CHEST - 1 VIEW  COMPARISON:  02/05/2013.  FINDINGS: The right jugular catheter has been removed. Surgical clips are noted at the thoracic inlet and in the lower neck bilaterally. Decreased inspiration with bibasilar atelectasis. Borderline enlarged cardiac silhouette. Stable mild prominence of the interstitial markings with mild diffuse peribronchial thickening. Extensive left shoulder degenerative changes are again demonstrated.  IMPRESSION: 1. Poor inspiration with bibasilar  atelectasis. 2. Stable chronic bronchitic changes.   Electronically Signed   By: Enrique Sack M.D.   On: 03/30/2014 21:50    Scheduled Meds: . [START ON 04/01/2014] darbepoetin (ARANESP) injection - DIALYSIS  25 mcg Intravenous Q Thu-HD  . [START ON 04/01/2014] doxercalciferol  4 mcg Intravenous Q T,Th,Sa-HD  . enoxaparin (LOVENOX) injection  30 mg Subcutaneous Q24H  . insulin aspart  0-9 Units Subcutaneous 6 times per day  . sevelamer carbonate  2,400 mg Oral TID WC  . sodium chloride  3 mL Intravenous Q12H  . sodium chloride  3 mL Intravenous Q12H   Continuous Infusions: . dextrose 25 mL/hr at 03/30/14 2328   Antibiotics Given (last 72 hours)   None      Principal Problem:   Hyperkalemia Active Problems:   HYPOTHYROIDISM   HYPERLIPIDEMIA   HYPERTENSION   PERIPHERAL VASCULAR DISEASE   SEIZURE DISORDER   COPD (chronic obstructive pulmonary disease)   Hypoglycemia   ESRD on hemodialysis   Anemia of renal disease    Time spent: 35 min    Marquon Alcala  [facility] Pager [phone] If 7PM-7AM, please contact night-coverage at [email or web address], password [facility] 03/31/2014, 11:58 AM  LOS: 1 day

## 2014-03-31 NOTE — H&P (Signed)
[facility] Admission History and Physical       Jeraline L Gosch DGL:[id number] DOB: [birth year] DOA: 03/30/2014  Referring physician: EDP PCP: Cathlean Cower, MD  Specialists:   Chief Complaint: Confusion   HPI: [name] [name] is a 73 y.o. female with a history of ESRD on HD, Seizures Disorder, HTN, DM2, Hyperlipidemia, Hypothyroid, Anemia who presents to the ED with complaints of increasing confusion for the past few days.  She had a recent amputation of her right second toe 5 days ago and has missed a total of 3 Dialysis treatments.  She reports that she takes the medical transportation to her dialysis treatments but was unable to walk out to the [facility].  A family friend was checking on her and found her to be confused and have increased weakness and inability to care for herself and had her brought to the ED.  In the ED she was found to have a potassium level of 6.0.   The EDP contacted and consulted the Nephrologist on Call DR Deterding who will have patient dialyzed in the AM.  The patient has been administered Kayexalate, and IV Dextrose, Insulin and Calcium Gluconate and was referred for medical admission.      Review of Systems:  Unable to Obtain from Patient   Past Medical History  Diagnosis Date  . HYPOTHYROIDISM 02/17/2007    s/p surgical removal of goiter in 1997  . DIABETES MELLITUS, TYPE II 02/17/2007  . HYPERLIPIDEMIA 02/17/2007  . GOUT 05/29/2007  . ANEMIA-NOS 05/29/2007  . ANXIETY 03/23/2010  . 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 DEPRESSION 02/17/2007  . RESTLESS LEG SYNDROME 05/29/2007  . COMMON MIGRAINE 05/29/2007  . PERIPHERAL NEUROPATHY 05/29/2007  . HYPERTENSION 02/17/2007  . PERIPHERAL VASCULAR DISEASE 02/17/2007  . PEPTIC ULCER DISEASE 05/29/2007  . Chronic kidney disease 02/17/2007    Followed by Dr. Florene Glen  . OSTEOPENIA 09/22/2009  . SEIZURE DISORDER 02/17/2007  . Memory loss 01/24/2010  . Personal history of colonic polyps 11/16/2009  . Palpitations 09/08/2010  . Other  diseases of vocal cords 1996  . HEART MURMUR, HX OF 10/02/2010  . 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 Chronic neck pain 12/13/2010  . GERD (gastroesophageal reflux disease)   . 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 Cancer of kidney 10/07/2012    Followed per Dr Despina Pole, MD, urology, [place]   . Arthritis   . Critical lower limb ischemia     Past Surgical History  Procedure Laterality Date  . Left toe amputated  2006  . Bunionectomy  1980  . Goiter removal  1997  . Stress cardiolite  06/18/2006  . Tranthoracic echocardiogram  06/18/2006  . Electrocardiogram  05/29/2006  . Cholecystectomy    . Rotator cuff repair  Left    Dr. Sharol Given  . Av fistula placement  03/13/2012    Procedure: ARTERIOVENOUS (AV) FISTULA CREATION;  Surgeon: Conrad Weatherford, MD;  Location: [place];  Service: Vascular;  Laterality: Right;  . Eye surgery      bilateral cataract removal  . Esophagoscopy w/ botox injection  07/22/2012    Procedure: ESOPHAGOSCOPY WITH BOTOX INJECTION;  Surgeon: Rozetta Nunnery, MD;  Location: [place];  Service: ENT;  Laterality: N/A;  esophageal dilation  . Insertion of dialysis catheter N/A 02/05/2013    Procedure: INSERTION OF DIALYSIS CATHETER;  Surgeon: Angelia Mould, MD;  Location: [place];  Service: Vascular;  Laterality: N/A;  Ultrasound guided     Prior to Admission medications   Medication Sig Start Date End Date Taking? Authorizing Provider  albuterol (PROVENTIL HFA;VENTOLIN HFA) 108 (90 BASE) MCG/ACT inhaler Inhale 2 puffs into the lungs every 6 (six) hours as needed for wheezing or shortness of breath. 10/14/13  Yes Biagio Borg, MD  allopurinol (ZYLOPRIM) 100 MG tablet Take 100 mg by mouth daily.  01/04/12  Yes Historical Provider, MD  aspirin EC 81 MG tablet Take 81 mg by mouth daily.   Yes Historical Provider, MD  atorvastatin (LIPITOR) 40 MG tablet Take 1 tablet (40 mg total) by mouth daily. 04/22/13  Yes  Biagio Borg, MD  clindamycin (CLEOCIN) 300 MG capsule Take 1 capsule (300 mg total) by mouth 3 (three) times daily. 03/19/14  Yes Bronson Ing, DPM  clonazePAM (KLONOPIN) 2 MG tablet Take 2 mg by mouth 2 (two) times daily as needed for anxiety.   Yes Historical Provider, MD  cyclobenzaprine (FLEXERIL) 10 MG tablet Take 10 mg by mouth 3 (three) times daily as needed for muscle spasms.    Yes Historical Provider, MD  dextromethorphan-guaiFENesin (MUCINEX DM) 30-600 MG per 12 hr tablet Take 1 tablet by mouth 2 (two) times daily as needed for cough.    Yes Historical Provider, MD  docusate sodium (COLACE) 100 MG capsule Take 100 mg by mouth 2 (two) times daily as needed for mild constipation.    Yes Historical Provider, MD  DULoxetine (CYMBALTA) 30 MG capsule Take 1 capsule (30 mg total) by mouth daily. 12/09/13  Yes Biagio Borg, MD  HYDROcodone-acetaminophen (NORCO/VICODIN) 5-325 MG per tablet Take 1 tablet by mouth every 4 (four) hours as needed for moderate pain.   Yes Historical Provider, MD  hydrOXYzine (ATARAX/VISTARIL) 25 MG tablet Take 25 mg by mouth every 6 (six) hours as needed for anxiety.   Yes Historical Provider, MD  lactulose (CHRONULAC) 10 GM/15ML solution Take 20 g by mouth daily.   Yes Historical Provider, MD  levothyroxine (SYNTHROID, LEVOTHROID) 125 MCG tablet TAKE ONE TABLET BY MOUTH ONCE DAILY   Yes Biagio Borg, MD  montelukast (SINGULAIR) 10 MG tablet TAKE ONE TABLET BY MOUTH AT BEDTIME   Yes Biagio Borg, MD  multivitamin (RENA-VIT) TABS tablet Take 1 tablet by mouth daily.   Yes Historical Provider, MD  omeprazole (PRILOSEC) 40 MG capsule Take 40 mg by mouth daily.  02/15/12  Yes Historical Provider, MD  promethazine (PHENERGAN) 25 MG tablet Take 1 tablet (25 mg total) by mouth every 6 (six) hours as needed for nausea. 10/23/13  Yes Biagio Borg, MD  QUEtiapine (SEROQUEL) 100 MG tablet TAKE ONE TABLET BY MOUTH AT BEDTIME 03/01/14  Yes Biagio Borg, MD  cyproheptadine  (PERIACTIN) 4 MG tablet Take 1 tablet (4 mg total) by mouth 2 (two) times daily. 01/05/13   Biagio Borg, MD  mometasone-formoterol (DULERA) 100-5 MCG/ACT AERO Inhale 2 puffs into the lungs 2 (two) times daily. 03/24/14   Biagio Borg, MD  oxyCODONE-acetaminophen (PERCOCET) 7.5-325 MG per tablet Take 1 tablet by mouth every 4 (four) hours as needed for pain. 03/26/14   Bronson Ing, DPM     Allergies  Allergen Reactions  . Cephalosporins Itching and Rash    Vanc and fortaz given at the same time in June for several doses at dialysis with systemic rash and itching; received zinacef 7/5 and had worseningsystemicrash/ itching and swelling of eyes - so unclear if allergic to either  or both  . Nsaids     REACTION: renal dysfunction  . Pioglitazone     Actos REACTION: EDEMA  . Vancomycin     See comment under cephalosporin     Social History:  reports that she has quit smoking. Her smoking use included Cigarettes. She has a 11.75 pack-year smoking history. She has never used smokeless tobacco. She reports that she does not drink alcohol or use illicit drugs.     Family History  Problem Relation Age of Onset  . Dementia Mother   . Hypertension Mother   . Coronary artery disease Other   . Hyperlipidemia Other   . Hypertension Other   . Ovarian cancer Other   . Stroke Other   . Hypertension Sister   . Hypertension Brother   . Heart attack Brother   . Stroke Brother        Physical Exam:  GEN:  Pleasantly Confused Elderly 73 y.o. African American female examined and in no acute distress; cooperative with exam Filed Vitals:   03/30/14 2215 03/30/14 2230 03/30/14 2245 03/30/14 2315  BP: 125/63 122/61 148/77 142/91  Pulse: 73 73 75 74  Temp:      TempSrc:      Resp: 12 12 16 23   Height:      Weight:      SpO2: 98% 97% 99% 94%   Blood pressure 142/91, pulse 74, temperature 98.5 F (36.9 C), temperature source Oral, resp. rate 23, height 5\' 7"  (1.702 m), weight 65.318 kg (144  lb), SpO2 94.00%. PSYCH: She is alert and oriented x 3 but confused; does not appear anxious does not appear depressed; affect is normal HEENT: Normocephalic and Atraumatic, Mucous membranes pink; PERRLA; EOM intact; Fundi:  Benign;  No scleral icterus, Nares: Patent, Oropharynx: Clear,     Neck:  FROM, No Cervical Lymphadenopathy nor Thyromegaly or Carotid Bruit; No JVD; Breasts:: Not examined CHEST WALL: No tenderness CHEST: Normal respiration, clear to auscultation bilaterally HEART: Regular rate and rhythm; no murmurs rubs or gallops BACK: No kyphosis or scoliosis; No CVA tenderness ABDOMEN: Positive Bowel Sounds, Soft Non-Tender; No Masses, No Organomegaly, No Pannus; No Intertriginous candida. Rectal Exam: Not done EXTREMITIES: Right Foot Dressed and clean an dry,     LLE:   Previous Great Toe Amputation,  No Cyanosis, Clubbing, or Edema; No Ulcerations. Genitalia: not examined PULSES: 2+ and symmetric SKIN: Normal hydration no rash or ulceration CNS:  Alert and oriented x 4, NonFocal Vascular: pulses palpable throughout    Labs on Admission:  Basic Metabolic Panel:  Recent Labs Lab 03/30/14 1939 03/30/14 2002  NA 139 135*  K 6.2* 6.0*  CL 96 100  CO2 25  --   GLUCOSE 80 77  BUN 58* 55*  CREATININE 13.34* 14.00*  CALCIUM 9.0  --    Liver Function Tests:  Recent Labs Lab 03/30/14 1939  AST 28  ALT <5  ALKPHOS 111  BILITOT 0.3  PROT 7.6  ALBUMIN 3.4*   No results found for this basename: LIPASE, AMYLASE,  in the last 168 hours No results found for this basename: AMMONIA,  in the last 168 hours CBC:  Recent Labs Lab 03/30/14 1939 03/30/14 2002  WBC 8.2  --   HGB 9.9* 10.9*  HCT 29.4* 32.0*  MCV 89.6  --   PLT 280  --    Cardiac Enzymes: No results found for this basename: CKTOTAL, CKMB, CKMBINDEX, TROPONINI,  in the last 168 hours  BNP (last 3  results) No results found for this basename: PROBNP,  in the last 8760 hours CBG:  Recent Labs Lab  03/30/14 1955 03/30/14 2241 03/30/14 2305  GLUCAP 89 54* 51*    Radiological Exams on Admission: Dg Chest Portable 1 View  03/30/2014   CLINICAL DATA:  Shortness of breath.  Dialysis patient.  EXAM: PORTABLE CHEST - 1 VIEW  COMPARISON:  02/05/2013.  FINDINGS: The right jugular catheter has been removed. Surgical clips are noted at the thoracic inlet and in the lower neck bilaterally. Decreased inspiration with bibasilar atelectasis. Borderline enlarged cardiac silhouette. Stable mild prominence of the interstitial markings with mild diffuse peribronchial thickening. Extensive left shoulder degenerative changes are again demonstrated.  IMPRESSION: 1. Poor inspiration with bibasilar atelectasis. 2. Stable chronic bronchitic changes.   Electronically Signed   By: Enrique Sack M.D.   On: 03/30/2014 21:50     EKG: Independently reviewed.    Assessment/Plan:   73 y.o. female with  Principal Problem:   1.   Hyperkalemia   Monitor on Telemetry   Given Kayexalate 30 gms PO x1    And IV Dextrose, Insulin, Calcium Gluconate    And 1 amp IV  Bicarb   Dialysis in AM, Nephrology consulted by EDP  Active Problems:   2.   Acute Encephalopathy- Most Likely due to Uremia   Neuro Checks      2.   Hypoglycemia   Monitor Glucose Levels   Follow Hypoglycemic protocol     3.   ESRD on dialysis   Dialysis in AM     4.   PERIPHERAL VASCULAR DISEASE- Recent 2nd Toe Amputation   Wound Care Evaluation     5.   SEIZURE DISORDER   Verify Seizure Meds     6.   COPD (chronic obstructive pulmonary disease)    DUONebs       7.   Anemia of renal disease   Monitor Hb levels   Send Anemia Panel    8.   HYPERTENSION        9.   HYPERLIPIDEMIA   Continue Statin Rx  10.   HYPOTHYROIDISM   Continue levothyroxine   Check TSH level   11.   DVT Prophylaxis    Lovenox.         Code Status:  FULL CODE   Family Communication:    POA ( Ms. Randall An, Friend ) at bedside Disposition Plan:     Inpatient  Time spent:  60 minutes  [facility] Pager [phone]   If [place] Please Contact the Day Rounding Team MD for [facility]  If 7PM-7AM, Please Contact night-coverage  [email or web address] Password [email or web address] 03/31/2014, 12:02 AM

## 2014-03-31 NOTE — Progress Notes (Signed)
PT Cancellation Note  Patient Details Name: [name] [name] MRN: [id number] DOB: 26-Mar-1941   Cancelled Treatment:    Reason Eval/Treat Not Completed: Patient not medically ready , pt has not been dialyzed yet, preparing to go currently. Will hold on PT eval until after HD. Discussed with MD.   Manus Rudd, [place] 03/31/2014, 10:51 AM

## 2014-03-31 NOTE — Progress Notes (Signed)
Inpatient Diabetes Program Recommendations  AACE/ADA: New Consensus Statement on Inpatient Glycemic Control (2013)  Target Ranges:  Prepandial:   less than 140 mg/dL      Peak postprandial:   less than 180 mg/dL (1-2 hours)      Critically ill patients:  140 - 180 mg/dL    Note patient is on PO diet.  Experienced hypoglycemia overnight.  Please consider changing Novolog correction to tid with meals and HS.    Thanks, Adah Perl, RN, BC-ADM Inpatient Diabetes Coordinator Pager [phone]

## 2014-04-01 LAB — RENAL FUNCTION PANEL
Albumin: 2.4 g/dL — ABNORMAL LOW (ref 3.5–5.2)
Anion gap: 12 (ref 5–15)
BUN: 22 mg/dL (ref 6–23)
CALCIUM: 8.3 mg/dL — AB (ref 8.4–10.5)
CHLORIDE: 99 meq/L (ref 96–112)
CO2: 29 meq/L (ref 19–32)
CREATININE: 6.77 mg/dL — AB (ref 0.50–1.10)
GFR calc Af Amer: 6 mL/min — ABNORMAL LOW (ref >90)
GFR, EST NON AFRICAN AMERICAN: 5 mL/min — AB (ref >90)
GLUCOSE: 105 mg/dL — AB (ref 70–99)
Phosphorus: 4.2 mg/dL (ref 2.3–4.6)
Potassium: 4.6 mEq/L (ref 3.7–5.3)
Sodium: 140 mEq/L (ref 137–147)

## 2014-04-01 LAB — GLUCOSE, CAPILLARY
GLUCOSE-CAPILLARY: 136 mg/dL — AB (ref 70–99)
GLUCOSE-CAPILLARY: 95 mg/dL (ref 70–99)
Glucose-Capillary: 127 mg/dL — ABNORMAL HIGH (ref 70–99)
Glucose-Capillary: 131 mg/dL — ABNORMAL HIGH (ref 70–99)
Glucose-Capillary: 92 mg/dL (ref 70–99)
Glucose-Capillary: 96 mg/dL (ref 70–99)

## 2014-04-01 LAB — CBC
HCT: 27.6 % — ABNORMAL LOW (ref 36.0–46.0)
HEMOGLOBIN: 9.1 g/dL — AB (ref 12.0–15.0)
MCH: 30.4 pg (ref 26.0–34.0)
MCHC: 33 g/dL (ref 30.0–36.0)
MCV: 92.3 fL (ref 78.0–100.0)
Platelets: 224 10*3/uL (ref 150–400)
RBC: 2.99 MIL/uL — ABNORMAL LOW (ref 3.87–5.11)
RDW: 16.1 % — ABNORMAL HIGH (ref 11.5–15.5)
WBC: 5.5 10*3/uL (ref 4.0–10.5)

## 2014-04-01 LAB — URINE CULTURE
Colony Count: 60000
Special Requests: NORMAL

## 2014-04-01 MED ORDER — ATORVASTATIN CALCIUM 40 MG PO TABS
40.0000 mg | ORAL_TABLET | Freq: Every day | ORAL | Status: DC
  Administered 2014-04-01 – 2014-04-05 (×5): 40 mg via ORAL
  Filled 2014-04-01 (×5): qty 1

## 2014-04-01 MED ORDER — CLONAZEPAM 1 MG PO TABS
2.0000 mg | ORAL_TABLET | Freq: Two times a day (BID) | ORAL | Status: DC | PRN
  Administered 2014-04-01 – 2014-04-04 (×4): 2 mg via ORAL
  Filled 2014-04-01 (×5): qty 2

## 2014-04-01 MED ORDER — GUAIFENESIN ER 600 MG PO TB12
600.0000 mg | ORAL_TABLET | Freq: Two times a day (BID) | ORAL | Status: DC | PRN
  Administered 2014-04-01 – 2014-04-04 (×3): 600 mg via ORAL
  Filled 2014-04-01 (×5): qty 1

## 2014-04-01 MED ORDER — LACTULOSE 10 GM/15ML PO SOLN
20.0000 g | Freq: Every day | ORAL | Status: DC
  Administered 2014-04-02 – 2014-04-05 (×3): 20 g via ORAL
  Filled 2014-04-01 (×5): qty 30

## 2014-04-01 MED ORDER — QUETIAPINE FUMARATE 100 MG PO TABS
100.0000 mg | ORAL_TABLET | Freq: Every day | ORAL | Status: DC
  Administered 2014-04-01 – 2014-04-04 (×5): 100 mg via ORAL
  Filled 2014-04-01 (×7): qty 1

## 2014-04-01 MED ORDER — ALLOPURINOL 100 MG PO TABS
100.0000 mg | ORAL_TABLET | Freq: Every day | ORAL | Status: DC
  Administered 2014-04-01 – 2014-04-05 (×5): 100 mg via ORAL
  Filled 2014-04-01 (×5): qty 1

## 2014-04-01 MED ORDER — DOXERCALCIFEROL 4 MCG/2ML IV SOLN
INTRAVENOUS | Status: AC
  Administered 2014-04-01: 4 ug via INTRAVENOUS
  Filled 2014-04-01: qty 2

## 2014-04-01 MED ORDER — RENA-VITE PO TABS
1.0000 | ORAL_TABLET | Freq: Every day | ORAL | Status: DC
  Administered 2014-04-01 – 2014-04-02 (×2): 1 via ORAL
  Administered 2014-04-03: 17:00:00 via ORAL
  Administered 2014-04-04: 1 via ORAL
  Administered 2014-04-05: 11:00:00 via ORAL
  Filled 2014-04-01 (×6): qty 1

## 2014-04-01 MED ORDER — CYCLOBENZAPRINE HCL 10 MG PO TABS: 10.0000 mg | ORAL_TABLET | Freq: Three times a day (TID) | ORAL | Status: DC | PRN

## 2014-04-01 MED ORDER — DARBEPOETIN ALFA-POLYSORBATE 25 MCG/0.42ML IJ SOLN
INTRAMUSCULAR | Status: AC
  Filled 2014-04-01: qty 0.42

## 2014-04-01 MED ORDER — ASPIRIN EC 81 MG PO TBEC
81.0000 mg | DELAYED_RELEASE_TABLET | Freq: Every day | ORAL | Status: DC
  Administered 2014-04-01 – 2014-04-05 (×4): 81 mg via ORAL
  Filled 2014-04-01 (×5): qty 1

## 2014-04-01 MED ORDER — LEVOTHYROXINE SODIUM 125 MCG PO TABS
125.0000 ug | ORAL_TABLET | Freq: Every day | ORAL | Status: DC
  Administered 2014-04-02 – 2014-04-05 (×4): 125 ug via ORAL
  Filled 2014-04-01 (×5): qty 1

## 2014-04-01 MED ORDER — ALBUTEROL SULFATE (2.5 MG/3ML) 0.083% IN NEBU: 2.5000 mg | INHALATION_SOLUTION | Freq: Four times a day (QID) | RESPIRATORY_TRACT | Status: DC | PRN

## 2014-04-01 MED ORDER — DULOXETINE HCL 30 MG PO CPEP
30.0000 mg | ORAL_CAPSULE | Freq: Every day | ORAL | Status: DC
  Administered 2014-04-01 – 2014-04-05 (×5): 30 mg via ORAL
  Filled 2014-04-01 (×5): qty 1

## 2014-04-01 MED ORDER — MONTELUKAST SODIUM 10 MG PO TABS
10.0000 mg | ORAL_TABLET | Freq: Every day | ORAL | Status: DC
  Administered 2014-04-01 – 2014-04-04 (×5): 10 mg via ORAL
  Filled 2014-04-01 (×6): qty 1

## 2014-04-01 MED ORDER — CLONAZEPAM 1 MG PO TABS
2.0000 mg | ORAL_TABLET | Freq: Every day | ORAL | Status: DC
  Administered 2014-04-01: 2 mg via ORAL
  Filled 2014-04-01: qty 2

## 2014-04-01 MED ORDER — PANTOPRAZOLE SODIUM 40 MG PO TBEC
40.0000 mg | DELAYED_RELEASE_TABLET | Freq: Every day | ORAL | Status: DC
  Administered 2014-04-01 – 2014-04-04 (×4): 40 mg via ORAL
  Filled 2014-04-01 (×3): qty 1

## 2014-04-01 MED ORDER — DOCUSATE SODIUM 100 MG PO CAPS: 100.0000 mg | ORAL_CAPSULE | Freq: Two times a day (BID) | ORAL | Status: DC | PRN

## 2014-04-01 MED ORDER — MOMETASONE FURO-FORMOTEROL FUM 100-5 MCG/ACT IN AERO
2.0000 | INHALATION_SPRAY | Freq: Two times a day (BID) | RESPIRATORY_TRACT | Status: DC
  Administered 2014-04-01 – 2014-04-05 (×8): 2 via RESPIRATORY_TRACT
  Filled 2014-04-01: qty 8.8

## 2014-04-01 MED ORDER — ALBUTEROL SULFATE HFA 108 (90 BASE) MCG/ACT IN AERS: 2.0000 | INHALATION_SPRAY | Freq: Four times a day (QID) | RESPIRATORY_TRACT | Status: DC | PRN

## 2014-04-01 NOTE — Evaluation (Signed)
Physical Therapy Evaluation Patient Details Name: [name] [name] MRN: [id number] DOB: 1941/01/06 Today's Date: 04/01/2014   History of Present Illness  [name] [name] is a 73 y.o. female with a history of ESRD on HD, Seizures Disorder, HTN, DM2, Hyperlipidemia, Hypothyroid, Anemia who presents to the ED with complaints of increasing confusion.  She had a recent amputation of her right second toe 03/25/14 and has missed a total of 3 Dialysis treatments. Hyperkalemia  Clinical Impression  Pt very pleasant and moving well with one partial LOB on initial standing with need to sit and stand again but without physical assist. Pt reports no falls in the last year and that a friend checks on her daily. Pt educated for transfers and gait and would benefit from acute therapy to maximize gait, transfers and independence. It pt able to reach modI level prior to D/C no further needs but it not would warrant HHPT and increased assist from friends given pt circumstances leading to admission. Will follow and recommend daily mobility with nursing.     Follow Up Recommendations Supervision - Intermittent;Home health PT (pending if pt does not meet acute goals)    Equipment Recommendations  None recommended by PT    Recommendations for Other Services       Precautions / Restrictions Precautions Precautions: Fall Required Braces or Orthoses: Other Brace/Splint Other Brace/Splint: Darco shoe for RLE Restrictions Weight Bearing Restrictions: No RLE Weight Bearing: Partial weight bearing (per pt-has hard shoe to wear) Other Position/Activity Restrictions: per pt no weight bearing restrictions      Mobility  Bed Mobility Overal bed mobility: Modified Independent                Transfers Overall transfer level: Needs assistance   Transfers: Sit to/from Stand Sit to Stand: Supervision         General transfer comment: cues for hand placement  Ambulation/Gait Ambulation/Gait assistance:  Supervision Ambulation Distance (Feet): 250 Feet Assistive device: Rolling walker (2 wheeled) Gait Pattern/deviations: Step-to pattern   Gait velocity interpretation: at or above normal speed for age/gender    Stairs Stairs: Yes Stairs assistance: Supervision Stair Management: One rail Right;Step to pattern Number of Stairs: 3 General stair comments: cues for sequence  Wheelchair Mobility    Modified Rankin (Stroke Patients Only)       Balance Overall balance assessment: Needs assistance   Sitting balance-Leahy Scale: Good       Standing balance-Leahy Scale: Fair                               Pertinent Vitals/Pain Pain Assessment: No/denies pain    Home Living Family/patient expects to be discharged to:: Private residence Living Arrangements: Alone Available Help at Discharge: Friend(s);Available PRN/intermittently Type of Home: House Home Access: Stairs to enter Entrance Stairs-Rails: Right Entrance Stairs-Number of Steps: 3 Home Layout: One level Home Equipment: Walker - 2 wheels;Cane - single point;Bedside commode      Prior Function Level of Independence: Needs assistance   Gait / Transfers Assistance Needed: pt states she was using darco shoe and RW for mobility     Comments: pt states she receives Meals on Wheels, public transportation to dialysis     Hand Dominance        Extremity/Trunk Assessment   Upper Extremity Assessment: Overall [facility] for tasks assessed           Lower Extremity Assessment: Overall WFL for tasks assessed  Cervical / Trunk Assessment: Normal  Communication   Communication: No difficulties  Cognition Arousal/Alertness: Awake/alert Behavior During Therapy: WFL for tasks assessed/performed Overall Cognitive Status: Impaired/Different from baseline Area of Impairment: Orientation;Memory Orientation Level: Time   Memory: Decreased short-term memory         General Comments: Pt tangential at  times and at least 3x during session forgot what she was trying to say. Can recall events preceeding admission but unaware of day of week.    General Comments      Exercises        Assessment/Plan    PT Assessment Patient needs continued PT services  PT Diagnosis Altered mental status;Difficulty walking   PT Problem List Decreased activity tolerance;Decreased balance;Decreased knowledge of use of DME;Decreased mobility  PT Treatment Interventions Gait training;DME instruction;Functional mobility training;Therapeutic activities;Therapeutic exercise;Patient/family education   PT Goals (Current goals can be found in the Care Plan section) Acute Rehab PT Goals Patient Stated Goal: return home PT Goal Formulation: With patient Time For Goal Achievement: 04/08/14 Potential to Achieve Goals: Good    Frequency Min 3X/week   Barriers to discharge Decreased caregiver support      Co-evaluation               End of Session Equipment Utilized During Treatment: Gait belt Activity Tolerance: Patient tolerated treatment well Patient left: in chair;with call bell/phone within reach           Time: 1159-1218 PT Time Calculation (min): 19 min   Charges:   PT Evaluation $Initial PT Evaluation Tier I: 1 Procedure PT Treatments $Therapeutic Activity: 8-22 mins   PT G CodesMelford Aase 04/01/2014, 12:56 PM Elwyn Reach, [place]

## 2014-04-01 NOTE — Clinical Social Work Note (Signed)
Clinical Social Worker received referral from RN for Dialysis transport, stating that patient is not mobile enough to get to the [facility] that picks her up for dialysis.  CSW reviewed chart.  PT/OT recommending home with home health.  Spoke with RN Case Manager who will follow up with patient to discuss home health needs.  Patient is ambulating 250 feet with walker and supervision, therefore should be able to get to dialysis [facility] outside of her home.  CSW signing off - please re consult if social work needs arise.  Barbette Or, [place]

## 2014-04-01 NOTE — Progress Notes (Signed)
PT Cancellation Note  Patient Details Name: [name] [name] MRN: [id number] DOB: July 30, 1941   Cancelled Treatment:    Reason Eval/Treat Not Completed: Patient at procedure or test/unavailable (in HD and not available)   Lanetta Inch Beth 04/01/2014, 7:23 AM  Elwyn Reach, [place]

## 2014-04-01 NOTE — Procedures (Signed)
I was present at this dialysis session, have reviewed the session itself and made  appropriate changes  Kelly Splinter MD (pgr) [phone]    [phone] 04/01/2014, 9:34 AM

## 2014-04-01 NOTE — Progress Notes (Signed)
Subjective:  Seen on dialysis, no current complaints, but little appetite  Objective: Vital signs in last 24 hours: Temp:  [97.4 F (36.3 C)-98.5 F (36.9 C)] 97.4 F (36.3 C) (08/27 0710) Pulse Rate:  [73-82] 73 (08/27 0710) Resp:  [12-18] 13 (08/27 0710) BP: (102-147)/(53-75) 102/59 mmHg (08/27 0710) SpO2:  [94 %-100 %] 94 % (08/27 0710) Weight:  [73.5 kg (162 lb 0.6 oz)-75.2 kg (165 lb 12.6 oz)] 73.5 kg (162 lb 0.6 oz) (08/27 0710) Weight change: 9.882 kg (21 lb 12.6 oz)  Intake/Output from previous day: 08/26 0701 - 08/27 0700 In: 240 [P.O.:240] Out: 1000  Intake/Output this shift:   Lab Results:  Recent Labs  03/30/14 1939 03/30/14 2002 03/31/14 0900  WBC 8.2  --  7.8  HGB 9.9* 10.9* 10.6*  HCT 29.4* 32.0* 32.1*  PLT 280  --  282   BMET:  Recent Labs  03/30/14 1939 03/30/14 2002 03/31/14 0900  NA 139 135* 138  K 6.2* 6.0* 6.9*  CL 96 100 95*  CO2 25  --  25  GLUCOSE 80 77 115*  BUN 58* 55* 58*  CREATININE 13.34* 14.00* 13.21*  CALCIUM 9.0  --  8.6  ALBUMIN 3.4*  --   --    No results found for this basename: PTH,  in the last 72 hours Iron Studies: No results found for this basename: IRON, TIBC, TRANSFERRIN, FERRITIN,  in the last 72 hours  Studies/Results: No results found.  EXAM: General appearance:  Alert, in no apparent distress Resp:  CTA without rales, rhonchi, or wheezes Cardio:  RRR without murmur or rub GI:  + BS, soft and nontender Extremities:  No edema  Access:  AVF @ [facility] with BFR 300 cc/min  HD: [facility]  3h 28min 69kg 300/600 2/2.25 [place] P4 R[facility] AVF Heparin 2200  Hectorol 4 ug TIW, Aranesp 25 ug q Thurs  Assessment/Plan: 1. Gen weakness / confusion / asterixis - due to uremia from missed HD, improving with HD yesterday and today. 2. 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 3. Hyperkalemia - sec to missed HD, K 6.9 pre-HD yesterday.   4. ESRD - HD on TTS @ [facility], but missed 1 wk before yesterday.  HD again today. 5. HTN/Volume -  BP 114/61, no meds; below EDW today. 6. Anemia - Hgb 10.6 on Aranesp 25 mcg on Thurs. 7. Sec HPT - Ca 8.6 (9.1 corrected), P 6; Hectorol 4 mcg, Renvela 3 with meals. 8. Nutrition - Alb 3.4, renal diet, multivitamin. 9. Chronic pain / psych - on seroquel, percocet, Cymbalta, Flexeril, Klonopin, vicodin per home med list. 10. PAD - previous toe amputations 11. Dispo- stable for dc from renal stanpdoint    LOS: 2 days   [name] [name],[name] [name] 04/01/2014,7:30 AM  Pt seen, examined and agree w A/P as above. Looks much better after HD yesterday.  No new suggestions. Kelly Splinter MD pager [phone]    cell [phone] 04/01/2014, 9:33 AM

## 2014-04-01 NOTE — Progress Notes (Addendum)
PROGRESS NOTE  [name] [name] DOB: 1941/06/04 DOA: 03/30/2014 PCP: Cathlean Cower, MD  [name] [name] is a 73 y.o. female with a history of ESRD on HD, Seizures Disorder, HTN, DM2, Hyperlipidemia, Hypothyroid, Anemia who presents to the ED with complaints of increasing confusion for the past few days. She had a recent amputation of her right second toe 5 days ago and has missed a total of 3 Dialysis treatments. She reports that she takes the medical transportation to her dialysis treatments but was unable to walk out to the [facility]. A family friend was checking on her and found her to be confused and have increased weakness and inability to care for herself and had her brought to the ED.   Assessment/Plan: Hyperkalemia   Monitor on Telemetry-sinus rhythm.  She was initially treated with Kayexalate 30 gms PO x1, IV Dextrose, Insulin, Calcium Gluconate & 1 amp IV Bicarb  She also received dialysis.  Resolved.  Acute Encephalopathy Most Likely due to Uremia From missed hemodialysis.  Appears to be back to baseline  Hypoglycemia/ ? DM 2   Monitor Glucose Levels. A1c 6.1   seems to have resolved. Soft CBGs. Continue NovoLog SSI without HS coverage.   ESRD on dialysis -On HD TTS. Missed one week hemodialysis before yesterday.  Dialysis & management per nephrology.  PERIPHERAL VASCULAR DISEASE- Recent 2nd Toe Amputation  Wound Care Evaluation   SEIZURE DISORDER   Seizure Meds   COPD (chronic obstructive pulmonary disease)  DUONebs. Stable   Anemia of renal disease  Monitor Hb levels   management per nephrology. Getting Aranesp across analysis.   HYPERTENSION  Controlled.  HYPERLIPIDEMIA  Continue Statin Rx    HYPOTHYROIDISM  Continue levothyroxine   recent TSH 10/14/13:3.74  Chronic pain: Patient on several home medications.  Chronic benzodiazepine use: Resume Klonopin.   Code Status: Full Family Communication:  none at bedside.  Disposition Plan: there are concerns  that patient is not mobile enough to get to the [facility] that picks her up for dialysis. PT and OT recommending home with home health. Case management to follow up with patient to discuss home health needs. Patient ambulating 250 feet with walker and supervision and therefore should be able to get to dialysis [facility] outside of her home. Possible DC 8/28.   Consultants:  Renal  Procedures:  Hemodialysis    HPI/Subjective:  patient was seen on hemodialysis this morning. She denied complaints. Denied pain.   Objective: Filed Vitals:   04/01/14 1419  BP: 136/65  Pulse: 81  Temp: 98.2 F (36.8 C)  Resp: 18  Oxygen saturation 94%.   Intake/Output Summary (Last 24 hours) at 04/01/14 1725 Last data filed at 04/01/14 1010  Gross per 24 hour  Intake      0 ml  Output   1000 ml  Net  -1000 ml   Filed Weights   03/31/14 1740 04/01/14 0710 04/01/14 1010  Weight: 74 kg (163 lb 2.3 oz) 73.5 kg (162 lb 0.6 oz) 73.6 kg (162 lb 4.1 oz)    Exam:   General:   moderately built and nourished female lying comfortably on bed having dialysis this morning.   Cardiovascular: rrr. Telemetry: Sinus rhythm.   Respiratory:  clear to auscultation. No increased work of breathing.   Abdomen: +Bs, soft & nontender.   Extremities: Amputated left first and second toes- healed. Right foot dressing, clean dry and intact.  Data Reviewed: Basic Metabolic Panel:  Recent Labs Lab 03/30/14 1939 03/30/14 2002  03/31/14 0900 04/01/14 0726  NA 139 135* 138 140  K 6.2* 6.0* 6.9* 4.6  CL 96 100 95* 99  CO2 25  --  25 29  GLUCOSE 80 77 115* 105*  BUN 58* 55* 58* 22  CREATININE 13.34* 14.00* 13.21* 6.77*  CALCIUM 9.0  --  8.6 8.3*  PHOS  --   --  6.0* 4.2   Liver Function Tests:  Recent Labs Lab 03/30/14 1939 04/01/14 0726  AST 28  --   ALT <5  --   ALKPHOS 111  --   BILITOT 0.3  --   PROT 7.6  --   ALBUMIN 3.4* 2.4*   No results found for this basename: LIPASE, AMYLASE,  in the last 168  hours No results found for this basename: AMMONIA,  in the last 168 hours CBC:  Recent Labs Lab 03/30/14 1939 03/30/14 2002 03/31/14 0900 04/01/14 0726  WBC 8.2  --  7.8 5.5  HGB 9.9* 10.9* 10.6* 9.1*  HCT 29.4* 32.0* 32.1* 27.6*  MCV 89.6  --  91.5 92.3  PLT 280  --  282 224   Cardiac Enzymes: No results found for this basename: CKTOTAL, CKMB, CKMBINDEX, TROPONINI,  in the last 168 hours BNP (last 3 results) No results found for this basename: PROBNP,  in the last 8760 hours CBG:  Recent Labs Lab 03/31/14 2256 04/01/14 0054 04/01/14 0624 04/01/14 1138 04/01/14 1617  GLUCAP 133* 127* 131* 92 96    Recent Results (from the past 240 hour(s))  URINE CULTURE     Status: None   Collection Time    03/30/14  8:38 PM      Result Value Ref Range Status   Specimen Description URINE, CATHETERIZED   Final   Special Requests Normal   Final   Culture  Setup Time     Final   Value: 03/31/2014 08:42     Performed at [facility]     Final   Value: 60,000 COLONIES/ML     Performed at [facility]   Culture     Final   Value: YEAST     Performed at [facility]   Report Status 04/01/2014 FINAL   Final     Studies: Dg Chest Portable 1 View  03/30/2014   CLINICAL DATA:  Shortness of breath.  Dialysis patient.  EXAM: PORTABLE CHEST - 1 VIEW  COMPARISON:  02/05/2013.  FINDINGS: The right jugular catheter has been removed. Surgical clips are noted at the thoracic inlet and in the lower neck bilaterally. Decreased inspiration with bibasilar atelectasis. Borderline enlarged cardiac silhouette. Stable mild prominence of the interstitial markings with mild diffuse peribronchial thickening. Extensive left shoulder degenerative changes are again demonstrated.  IMPRESSION: 1. Poor inspiration with bibasilar atelectasis. 2. Stable chronic bronchitic changes.   Electronically Signed   By: Enrique Sack M.D.   On: 03/30/2014 21:50    Scheduled Meds: .  clonazePAM  2 mg Oral QHS  . darbepoetin      . darbepoetin (ARANESP) injection - DIALYSIS  25 mcg Intravenous Q Thu-HD  . doxercalciferol  4 mcg Intravenous Q T,Th,Sa-HD  . enoxaparin (LOVENOX) injection  30 mg Subcutaneous Q24H  . insulin aspart  0-5 Units Subcutaneous QHS  . insulin aspart  0-9 Units Subcutaneous TID WC  . montelukast  10 mg Oral QHS  . QUEtiapine  100 mg Oral QHS  . sevelamer carbonate  2,400 mg Oral TID WC  .  sodium chloride  3 mL Intravenous Q12H  . sodium chloride  3 mL Intravenous Q12H   Continuous Infusions: . dextrose 25 mL/hr at 03/30/14 2328   Antibiotics Given (last 72 hours)   None      Principal Problem:   Hyperkalemia Active Problems:   HYPOTHYROIDISM   HYPERLIPIDEMIA   HYPERTENSION   PERIPHERAL VASCULAR DISEASE   SEIZURE DISORDER   COPD (chronic obstructive pulmonary disease)   Hypoglycemia   ESRD on hemodialysis   Anemia of renal disease    Time spent: 69 min   Rachella Basden, MD, FACP, FHM. [facility] Pager [phone]  If 7PM-7AM, please contact night-coverage [email or web address] Password [facility] 04/01/2014, 5:36 PM    LOS: 2 days

## 2014-04-02 ENCOUNTER — Encounter: Payer: Medicare Other | Admitting: Podiatrist

## 2014-04-02 DIAGNOSIS — R112 Nausea with vomiting, unspecified: Secondary | ICD-10-CM

## 2014-04-02 LAB — CBC
HCT: 30.6 % — ABNORMAL LOW (ref 36.0–46.0)
Hemoglobin: 10.1 g/dL — ABNORMAL LOW (ref 12.0–15.0)
MCH: 30.5 pg (ref 26.0–34.0)
MCHC: 33 g/dL (ref 30.0–36.0)
MCV: 92.4 fL (ref 78.0–100.0)
Platelets: 208 10*3/uL (ref 150–400)
RBC: 3.31 MIL/uL — AB (ref 3.87–5.11)
RDW: 16 % — ABNORMAL HIGH (ref 11.5–15.5)
WBC: 6.1 10*3/uL (ref 4.0–10.5)

## 2014-04-02 LAB — GLUCOSE, CAPILLARY
Glucose-Capillary: 83 mg/dL (ref 70–99)
Glucose-Capillary: 94 mg/dL (ref 70–99)
Glucose-Capillary: 122 mg/dL — ABNORMAL HIGH (ref 70–99)
Glucose-Capillary: 105 mg/dL — ABNORMAL HIGH (ref 70–99)

## 2014-04-02 MED ORDER — ONDANSETRON HCL 4 MG/2ML IJ SOLN
4.0000 mg | Freq: Once | INTRAMUSCULAR | Status: AC
  Administered 2014-04-02: 4 mg via INTRAVENOUS

## 2014-04-02 NOTE — Progress Notes (Signed)
PT Cancellation Note  Patient Details Name: [name] [name] MRN: [id number] DOB: [birth year]   Cancelled Treatment:    Reason Eval/Treat Not Completed: Patient declined, no reason specified ("not now")   Tymeir Weathington 04/02/2014, 3:27 PM

## 2014-04-02 NOTE — Care Management (Signed)
Met with pt to discuss dc planning.  Pt states she missed HD because her foot was hurting, and she didn't feel like going.  Pt states she wants to go to a skilled nursing facility until her foot is better.  Pt refused to work with Physical Therapy today--I explained that if she goes to SNF, she will need to participate with therapy in order to continue to stay there.  She stated, "no one from PT has seen me today, I have been asleep."  Physical therapist clearly documented in chart that she visited pt.    Lionel December, RN, BSN Phone [phone]

## 2014-04-02 NOTE — Care Management Note (Signed)
    Page 1 of 1   04/08/2014     11:12:14 AM CARE MANAGEMENT NOTE 04/08/2014  Patient:  [name] [name], [name] [name]   Account Number:  [id number]  Date Initiated:  04/02/2014  Documentation initiated by:  Thandiwe Siragusa  Subjective/Objective Assessment:   Pt adm on 03/30/14 with AMS; missed HD.  PTA, pt lives alone.  Recent toe amputation, per report.     Action/Plan:   Pt states she did not go to HD because her foot was hurting, and she had to walk to HD transport van.  She wants to go to SNF until her foot is feeling better.  CSW to follow.   Anticipated DC Date:  04/05/2014   Anticipated DC Plan:  SKILLED NURSING FACILITY  In-house referral  Clinical Social Worker      DC Planning Services  CM consult      Choice offered to / List presented to:             Status of service:  Completed, signed off Medicare Important Message given?  YES (If response is "NO", the following Medicare IM given date fields will be blank) Date Medicare IM given:  04/02/2014 Medicare IM given by:  Salote Weidmann Date Additional Medicare IM given:  04/05/2014 Additional Medicare IM given by:  Shadia Larose  Discharge Disposition:  [address]  Per UR Regulation:  Reviewed for med. necessity/level of care/duration of stay  If discussed at [place] of Stay Meetings, dates discussed:    Comments:  04/05/14 Ellan Lambert, RN, BSN [phone] Pt discharging to SNF today,per CSW arrangements.

## 2014-04-02 NOTE — Progress Notes (Signed)
Subjective:  NO complaints  Objective: Vital signs in last 24 hours: Temp:  [98 F (36.7 C)-98.2 F (36.8 C)] 98.2 F (36.8 C) (08/28 0512) Pulse Rate:  [73-83] 73 (08/28 0512) Resp:  [16-18] 16 (08/28 0512) BP: (135-162)/(60-85) 135/60 mmHg (08/28 0512) SpO2:  [91 %-95 %] 91 % (08/28 0959) Weight change: -1.7 kg (-3 lb 12 oz)  Intake/Output from previous day: 08/27 0701 - 08/28 0700 In: 250 [P.O.:250] Out: 0  Intake/Output this shift:   Lab Results:  Recent Labs  04/01/14 0726 04/02/14 0555  WBC 5.5 6.1  HGB 9.1* 10.1*  HCT 27.6* 30.6*  PLT 224 208   BMET:  Recent Labs  03/30/14 1939  03/31/14 0900 04/01/14 0726  NA 139  < > 138 140  K 6.2*  < > 6.9* 4.6  CL 96  < > 95* 99  CO2 25  --  25 29  GLUCOSE 80  < > 115* 105*  BUN 58*  < > 58* 22  CREATININE 13.34*  < > 13.21* 6.77*  CALCIUM 9.0  --  8.6 8.3*  ALBUMIN 3.4*  --   --  2.4*  < > = values in this interval not displayed. No results found for this basename: PTH,  in the last 72 hours [place] Studies: No results found for this basename: IRON, TIBC, TRANSFERRIN, FERRITIN,  in the last 72 hours  Studies/Results: No results found.  EXAM: Gen: Frail, responsive Resp:  CTA without rales, rhonchi, or wheezes Cardio:  RRR without murmur or rub GI:  + BS, soft and nontender Extremities:  No edema  Neuro : gen diffuse weakness Access:  AVF @ [facility] with BFR 300 cc/min  HD: [facility] [place]  3h 10min 69kg 300/600 2/2.25 [place] P4 RUA AVF Heparin 2200  Hectorol 4 ug TIW, Aranesp 25 ug q Thurs  Assessment/Plan: 1. AMS / gen weakness - uremia treated and MS back to baseline but pt still very weak and lives alone 2. R 2nd toe amputation - @ [facility] last wk 3. ESRD - HD TTS 4. HTN/Volume - no meds, below EDW  5. Anemia - Hgb 10.6 on Aranesp 25 mcg on Thurs. 6. Sec HPT - Ca 8.6 (9.1 corrected), P 6; Hectorol 4 mcg, Renvela 3 with meals 7. Chronic pain / psych - on seroquel, percocet, Cymbalta, Flexeril, Klonopin, vicodin per  home med list. 8. Dispo- probably needs SNF placement for rehab, pt in agreement when we discussed it this morning   Kelly Splinter MD pager [phone]    cell [phone] 04/02/2014, 11:21 AM

## 2014-04-02 NOTE — Consult Note (Signed)
WOC wound consult note Reason for Consult: evaluation of right second toe site. Recent amputation per Dr. Rolley Sims at [facility], performed last Friday.  Wound type: surgical  Pressure Ulcer POA: No Measurement: 4cm linear incision with sutures in place and intact Wound SVX:[id number] incision Drainage (amount, consistency, odor) none Periwound:intact  Dressing procedure/placement/frequency: Dry dressing to protect. Change daily for assessment. Offloading shoe when ambulating. Follow up with podiatrist as scheduled. May need new appt if she is inpatient during current scheduled appt.   Discussed POC with patient and bedside nurse.  Re consult if needed, will not follow at this time. Thanks  Rjay Revolorio Kellogg, [place] [phone])

## 2014-04-02 NOTE — Progress Notes (Signed)
PROGRESS NOTE  [name] [name] QZE:[id number] DOB: 03/05/1941 DOA: 03/30/2014 PCP: Cathlean Cower, MD  [name] [name] is a 73 y.o. female with a history of ESRD on HD, Seizures Disorder, HTN, DM2, Hyperlipidemia, Hypothyroid, Anemia who presents to the ED with complaints of increasing confusion for the past few days. She had a recent amputation of her right second toe 5 days ago and has missed a total of 3 Dialysis treatments. She reports that she takes the medical transportation to her dialysis treatments but was unable to walk out to the [facility]. A family friend was checking on her and found her to be confused and have increased weakness and inability to care for herself and had her brought to the ED.   Assessment/Plan: Nausea PRN zosyn  Hyperkalemia   Resolved.  D/C telemetry  Acute Encephalopathy Most Likely due to Uremia  Appears to be back to baseline  Hypoglycemia/ ? DM 2   Hold SSI until eating better. CBGs running borderline low   ESRD on dialysis -On HD TTS. Missed one week hemodialysis.  Likely contributing to nausea  PERIPHERAL VASCULAR DISEASE- Recent 2nd Toe Amputation   SEIZURE DISORDER   Seizure Meds   COPD (chronic obstructive pulmonary disease)  DUONebs. Stable   Anemia of renal disease  Monitor Hb levels   management per nephrology. Getting Aranesp across analysis.   HYPERTENSION  Controlled.  HYPERLIPIDEMIA  Continue Statin Rx    HYPOTHYROIDISM  Continue levothyroxine   recent TSH 10/14/13:3.74  Chronic pain: Patient on several home medications.  Chronic benzodiazepine use: Resume Klonopin.   Code Status: Full Family Communication:  none at bedside.  Disposition Plan: now looking into SNF per nephrology.   Consultants:  Renal  Procedures:  Hemodialysis  HPI/Subjective: C/o nausea starting yesterday. Thinks it was from strawberry shortcake  Objective: Filed Vitals:   04/02/14 1436  BP: 157/96  Pulse: 81  Temp: 99.2 F (37.3 C)  Resp: 17    Oxygen saturation 94%.   Intake/Output Summary (Last 24 hours) at 04/02/14 1506 Last data filed at 04/02/14 1435  Gross per 24 hour  Intake    370 ml  Output      0 ml  Net    370 ml   Filed Weights   03/31/14 1740 04/01/14 0710 04/01/14 1010  Weight: 74 kg (163 lb 2.3 oz) 73.5 kg (162 lb 0.6 oz) 73.6 kg (162 lb 4.1 oz)    Exam:   General:   Chronically ill appearing. Oriented and appropriate  Cardiovascular: rrr. Telemetry: Sinus rhythm.   Respiratory:  clear to auscultation. No increased work of breathing.   Abdomen: +Bs, soft & nontender.   Extremities: Amputated left first and second toes- healed. Right foot dressing, clean dry and intact.  Data Reviewed: Basic Metabolic Panel:  Recent Labs Lab 03/30/14 1939 03/30/14 2002 03/31/14 0900 04/01/14 0726  NA 139 135* 138 140  K 6.2* 6.0* 6.9* 4.6  CL 96 100 95* 99  CO2 25  --  25 29  GLUCOSE 80 77 115* 105*  BUN 58* 55* 58* 22  CREATININE 13.34* 14.00* 13.21* 6.77*  CALCIUM 9.0  --  8.6 8.3*  PHOS  --   --  6.0* 4.2   Liver Function Tests:  Recent Labs Lab 03/30/14 1939 04/01/14 0726  AST 28  --   ALT <5  --   ALKPHOS 111  --   BILITOT 0.3  --   PROT 7.6  --   ALBUMIN 3.4* 2.4*  No results found for this basename: LIPASE, AMYLASE,  in the last 168 hours No results found for this basename: AMMONIA,  in the last 168 hours CBC:  Recent Labs Lab 03/30/14 1939 03/30/14 2002 03/31/14 0900 04/01/14 0726 04/02/14 0555  WBC 8.2  --  7.8 5.5 6.1  HGB 9.9* 10.9* 10.6* 9.1* 10.1*  HCT 29.4* 32.0* 32.1* 27.6* 30.6*  MCV 89.6  --  91.5 92.3 92.4  PLT 280  --  282 224 208   Cardiac Enzymes: No results found for this basename: CKTOTAL, CKMB, CKMBINDEX, TROPONINI,  in the last 168 hours BNP (last 3 results) No results found for this basename: PROBNP,  in the last 8760 hours CBG:  Recent Labs Lab 04/01/14 1138 04/01/14 1617 04/01/14 2045 04/02/14 0627 04/02/14 1125  GLUCAP 92 96 95 83 94     Recent Results (from the past 240 hour(s))  URINE CULTURE     Status: None   Collection Time    03/30/14  8:38 PM      Result Value Ref Range Status   Specimen Description URINE, CATHETERIZED   Final   Special Requests Normal   Final   Culture  Setup Time     Final   Value: 03/31/2014 08:42     Performed at [facility]     Final   Value: 60,000 COLONIES/ML     Performed at [facility]   Culture     Final   Value: YEAST     Performed at [facility]   Report Status 04/01/2014 FINAL   Final     Studies: No results found.  Scheduled Meds: . allopurinol  100 mg Oral Daily  . aspirin EC  81 mg Oral Daily  . atorvastatin  40 mg Oral Daily  . darbepoetin (ARANESP) injection - DIALYSIS  25 mcg Intravenous Q Thu-HD  . doxercalciferol  4 mcg Intravenous Q T,Th,Sa-HD  . DULoxetine  30 mg Oral Daily  . enoxaparin (LOVENOX) injection  30 mg Subcutaneous Q24H  . insulin aspart  0-9 Units Subcutaneous TID WC  . lactulose  20 g Oral Daily  . levothyroxine  125 mcg Oral QAC breakfast  . mometasone-formoterol  2 puff Inhalation BID  . montelukast  10 mg Oral QHS  . multivitamin  1 tablet Oral Daily  . pantoprazole  40 mg Oral Daily  . QUEtiapine  100 mg Oral QHS  . sevelamer carbonate  2,400 mg Oral TID WC  . sodium chloride  3 mL Intravenous Q12H  . sodium chloride  3 mL Intravenous Q12H   Continuous Infusions: . dextrose 25 mL/hr at 04/02/14 0705   Antibiotics Given (last 72 hours)   None      Time spent: 25 min  Delfina Redwood, MD [facility] Pager [phone]  If 7PM-7AM, please contact night-coverage [email or web address] Password [email or web address] 04/02/2014, 3:06 PM    LOS: 3 days

## 2014-04-03 LAB — RENAL FUNCTION PANEL
ALBUMIN: 2.7 g/dL — AB (ref 3.5–5.2)
ANION GAP: 12 (ref 5–15)
BUN: 18 mg/dL (ref 6–23)
CO2: 27 mEq/L (ref 19–32)
CREATININE: 6.26 mg/dL — AB (ref 0.50–1.10)
Calcium: 8.5 mg/dL (ref 8.4–10.5)
Chloride: 96 mEq/L (ref 96–112)
GFR calc Af Amer: 7 mL/min — ABNORMAL LOW (ref >90)
GFR calc non Af Amer: 6 mL/min — ABNORMAL LOW (ref >90)
Glucose, Bld: 123 mg/dL — ABNORMAL HIGH (ref 70–99)
POTASSIUM: 4.8 meq/L (ref 3.7–5.3)
Phosphorus: 3.8 mg/dL (ref 2.3–4.6)
Sodium: 135 mEq/L — ABNORMAL LOW (ref 137–147)

## 2014-04-03 LAB — CBC
HCT: 30.4 % — ABNORMAL LOW (ref 36.0–46.0)
Hemoglobin: 10.3 g/dL — ABNORMAL LOW (ref 12.0–15.0)
MCH: 30 pg (ref 26.0–34.0)
MCHC: 33.9 g/dL (ref 30.0–36.0)
MCV: 88.6 fL (ref 78.0–100.0)
Platelets: 242 10*3/uL (ref 150–400)
RBC: 3.43 MIL/uL — ABNORMAL LOW (ref 3.87–5.11)
RDW: 15.5 % (ref 11.5–15.5)
WBC: 7.7 10*3/uL (ref 4.0–10.5)

## 2014-04-03 LAB — GLUCOSE, CAPILLARY
GLUCOSE-CAPILLARY: 90 mg/dL (ref 70–99)
GLUCOSE-CAPILLARY: 93 mg/dL (ref 70–99)
Glucose-Capillary: 98 mg/dL (ref 70–99)

## 2014-04-03 MED ORDER — ALTEPLASE 2 MG IJ SOLR: 2.0000 mg | Freq: Once | INTRAMUSCULAR | Status: DC | PRN

## 2014-04-03 MED ORDER — LIDOCAINE HCL (PF) 1 % IJ SOLN
5.0000 mL | INTRAMUSCULAR | Status: DC | PRN
Start: 2014-04-03 — End: 2014-04-03

## 2014-04-03 MED ORDER — NEPRO/CARBSTEADY PO LIQD
237.0000 mL | ORAL | Status: DC | PRN
Start: 2014-04-03 — End: 2014-04-03

## 2014-04-03 MED ORDER — OXYCODONE HCL 5 MG PO TABS
ORAL_TABLET | ORAL | Status: AC
  Filled 2014-04-03: qty 1

## 2014-04-03 MED ORDER — DOXERCALCIFEROL 4 MCG/2ML IV SOLN
INTRAVENOUS | Status: AC
  Filled 2014-04-03: qty 2

## 2014-04-03 MED ORDER — PENTAFLUOROPROP-TETRAFLUOROETH EX AERO
1.0000 | INHALATION_SPRAY | CUTANEOUS | Status: DC | PRN
Start: 2014-04-03 — End: 2014-04-03

## 2014-04-03 MED ORDER — HEPARIN SODIUM (PORCINE) 1000 UNIT/ML DIALYSIS: 2000.0000 [IU] | Freq: Once | INTRAMUSCULAR | Status: DC

## 2014-04-03 MED ORDER — OXYCODONE-ACETAMINOPHEN 5-325 MG PO TABS
ORAL_TABLET | ORAL | Status: AC
  Filled 2014-04-03: qty 1

## 2014-04-03 MED ORDER — SODIUM CHLORIDE 0.9 % IV SOLN: 100.0000 mL | INTRAVENOUS | Status: DC | PRN

## 2014-04-03 MED ORDER — HEPARIN SODIUM (PORCINE) 1000 UNIT/ML DIALYSIS
1000.0000 [IU] | INTRAMUSCULAR | Status: DC | PRN
Start: 2014-04-03 — End: 2014-04-03

## 2014-04-03 MED ORDER — LIDOCAINE-PRILOCAINE 2.5-2.5 % EX CREA: 1.0000 "application " | TOPICAL_CREAM | CUTANEOUS | Status: DC | PRN

## 2014-04-03 NOTE — Progress Notes (Signed)
PROGRESS NOTE  [name] [name] MEQ:[id number] DOB: [birth year] DOA: 03/30/2014 PCP: Cathlean Cower, MD  [name] [name] is a 73 y.o. female with a history of ESRD on HD, Seizures Disorder, HTN, DM2, Hyperlipidemia, Hypothyroid, Anemia who presents to the ED with complaints of increasing confusion for the past few days. She had a recent amputation of her right second toe 5 days ago and has missed a total of 3 Dialysis treatments. She reports that she takes the medical transportation to her dialysis treatments but was unable to walk out to the [facility]. A family friend was checking on her and found her to be confused and have increased weakness and inability to care for herself and had her brought to the ED.   Assessment/Plan: Nausea PRN zofran  Hyperkalemia   Resolved.  D/C telemetry  Acute Encephalopathy Most Likely due to Uremia  Appears to be back to baseline  Hypoglycemia/ ? DM 2   Hold SSI until eating better. CBGs running borderline low   ESRD on dialysis -On HD TTS. Missed one week hemodialysis.  Likely contributing to nausea  PERIPHERAL VASCULAR DISEASE- Recent 2nd Toe Amputation   SEIZURE DISORDER   Seizure Meds   COPD (chronic obstructive pulmonary disease)  DUONebs. Stable   Anemia of renal disease  Monitor Hb levels   management per nephrology. Getting Aranesp across analysis.   HYPERTENSION  Controlled.  HYPERLIPIDEMIA  Continue Statin Rx    HYPOTHYROIDISM  Continue levothyroxine   recent TSH 10/14/13:3.74  Chronic pain: Patient on several home medications.  Chronic benzodiazepine use: Resume Klonopin.   Code Status: Full Family Communication:  none at bedside.  Disposition Plan: now looking into SNF per nephrology.   Consultants:  Renal  Procedures:  Hemodialysis  HPI/Subjective: No new complaints  Objective: Filed Vitals:   04/03/14 1315  BP: 149/83  Pulse: 83  Temp: 98.6 F (37 C)  Resp: 18  Oxygen saturation 94%.   Intake/Output Summary  (Last 24 hours) at 04/03/14 1423 Last data filed at 04/03/14 1300  Gross per 24 hour  Intake 2034.17 ml  Output   1224 ml  Net 810.17 ml   Filed Weights   04/03/14 0606 04/03/14 1110 04/03/14 1300  Weight: 72.8 kg (160 lb 7.9 oz) 72.8 kg (160 lb 7.9 oz) 70.3 kg (154 lb 15.7 oz)    Exam:   General:   Getting dialysis  Cardiovascular: rrr. Telemetry: Sinus rhythm.   Respiratory:  clear to auscultation. No increased work of breathing.   Abdomen: +Bs, soft & nontender.   Extremities: Amputated left first and second toes- healed. Right foot dressing, clean dry and intact.  Data Reviewed: Basic Metabolic Panel:  Recent Labs Lab 03/30/14 1939 03/30/14 2002 03/31/14 0900 04/01/14 0726 04/03/14 1204  NA 139 135* 138 140 135*  K 6.2* 6.0* 6.9* 4.6 4.8  CL 96 100 95* 99 96  CO2 25  --  25 29 27   GLUCOSE 80 77 115* 105* 123*  BUN 58* 55* 58* 22 18  CREATININE 13.34* 14.00* 13.21* 6.77* 6.26*  CALCIUM 9.0  --  8.6 8.3* 8.5  PHOS  --   --  6.0* 4.2 3.8   Liver Function Tests:  Recent Labs Lab 03/30/14 1939 04/01/14 0726 04/03/14 1204  AST 28  --   --   ALT <5  --   --   ALKPHOS 111  --   --   BILITOT 0.3  --   --   PROT 7.6  --   --  ALBUMIN 3.4* 2.4* 2.7*   No results found for this basename: LIPASE, AMYLASE,  in the last 168 hours No results found for this basename: AMMONIA,  in the last 168 hours CBC:  Recent Labs Lab 03/30/14 1939 03/30/14 2002 03/31/14 0900 04/01/14 0726 04/02/14 0555 04/03/14 1204  WBC 8.2  --  7.8 5.5 6.1 7.7  HGB 9.9* 10.9* 10.6* 9.1* 10.1* 10.3*  HCT 29.4* 32.0* 32.1* 27.6* 30.6* 30.4*  MCV 89.6  --  91.5 92.3 92.4 88.6  PLT 280  --  282 224 208 242   Cardiac Enzymes: No results found for this basename: CKTOTAL, CKMB, CKMBINDEX, TROPONINI,  in the last 168 hours BNP (last 3 results) No results found for this basename: PROBNP,  in the last 8760 hours CBG:  Recent Labs Lab 04/02/14 0627 04/02/14 1125 04/02/14 1619  04/02/14 2048 04/03/14 0604  GLUCAP 83 94 122* 105* 90    Recent Results (from the past 240 hour(s))  URINE CULTURE     Status: None   Collection Time    03/30/14  8:38 PM      Result Value Ref Range Status   Specimen Description URINE, CATHETERIZED   Final   Special Requests Normal   Final   Culture  Setup Time     Final   Value: 03/31/2014 08:42     Performed at [facility]     Final   Value: 60,000 COLONIES/ML     Performed at [facility]   Culture     Final   Value: YEAST     Performed at [facility]   Report Status 04/01/2014 FINAL   Final     Studies: No results found.  Scheduled Meds: . allopurinol  100 mg Oral Daily  . aspirin EC  81 mg Oral Daily  . atorvastatin  40 mg Oral Daily  . darbepoetin (ARANESP) injection - DIALYSIS  25 mcg Intravenous Q Thu-HD  . doxercalciferol      . doxercalciferol  4 mcg Intravenous Q T,Th,Sa-HD  . DULoxetine  30 mg Oral Daily  . enoxaparin (LOVENOX) injection  30 mg Subcutaneous Q24H  . lactulose  20 g Oral Daily  . levothyroxine  125 mcg Oral QAC breakfast  . mometasone-formoterol  2 puff Inhalation BID  . montelukast  10 mg Oral QHS  . multivitamin  1 tablet Oral Daily  . oxyCODONE      . pantoprazole  40 mg Oral Daily  . QUEtiapine  100 mg Oral QHS  . sevelamer carbonate  2,400 mg Oral TID WC  . sodium chloride  3 mL Intravenous Q12H  . sodium chloride  3 mL Intravenous Q12H   Continuous Infusions: . dextrose 25 mL/hr at 04/02/14 0705   Antibiotics Given (last 72 hours)   None      Time spent: 25 min  Delfina Redwood, MD [facility] Pager [phone]  If 7PM-7AM, please contact night-coverage [email or web address] Password [email or web address] 04/03/2014, 2:23 PM    LOS: 4 days

## 2014-04-03 NOTE — Procedures (Signed)
I was present at this dialysis session, have reviewed the session itself and made  appropriate changes  Kelly Splinter MD (pgr) [phone]    ([phone] 04/03/2014, 1:22 PM

## 2014-04-03 NOTE — Progress Notes (Signed)
Pt starting oozing blood around arterial needle site during hemodialysis Tx. Not able to tell what the problem was. BFR decreased, surgifoam and pressure applied and bleeding went to a constant trickle. Tx stopped and needle pulled. Pt decided she did not want to start her Tx back at this time. Dr. Jonnie Finner aware and state this is OK. Pt ran 1hr 85min of her 3.5hr.

## 2014-04-03 NOTE — Progress Notes (Signed)
Subjective:  Seen on dialysis, continues to have nausea and poor appetite; had bleeding from access during dialysis, but when needle was removed for repositioning, she refused to continue treatment ( after 1:45 of 3-hr Tx)  Objective: Vital signs in last 24 hours: Temp:  [98.3 F (36.8 C)-99.2 F (37.3 C)] 98.6 F (37 C) (08/29 1315) Pulse Rate:  [71-88] 83 (08/29 1315) Resp:  [16-20] 18 (08/29 1315) BP: (143-177)/(71-97) 149/83 mmHg (08/29 1315) SpO2:  [93 %-97 %] 94 % (08/29 1315) Weight:  [70.3 kg (154 lb 15.7 oz)-72.8 kg (160 lb 7.9 oz)] 70.3 kg (154 lb 15.7 oz) (08/29 1300) Weight change: -0.7 kg (-1 lb 8.7 oz)  Intake/Output from previous day: 08/28 0701 - 08/29 0700 In: 2034.2 [P.O.:240; I.V.:1794.2] Out: -  Intake/Output this shift: Total I/O In: -  Out: 1224 [Other:1224]  Lab Results:  Recent Labs  04/02/14 0555 04/03/14 1204  WBC 6.1 7.7  HGB 10.1* 10.3*  HCT 30.6* 30.4*  PLT 208 242   BMET:  Recent Labs  04/01/14 0726 04/03/14 1204  NA 140 135*  K 4.6 4.8  CL 99 96  CO2 29 27  GLUCOSE 105* 123*  BUN 22 18  CREATININE 6.77* 6.26*  CALCIUM 8.3* 8.5  ALBUMIN 2.4* 2.7*   No results found for this basename: PTH,  in the last 72 hours Iron Studies: No results found for this basename: IRON, TIBC, TRANSFERRIN, FERRITIN,  in the last 72 hours  Studies/Results: No results found.  EXAM:  General appearance: Alert, in no apparent distress  Resp: CTA without rales, rhonchi, or wheezes  Cardio: RRR without murmur or rub  GI: + BS, soft and nontender  Extremities: No edema  Access: AVF @ RUA with BFR 300 cc/min   [facility]  3h 84min 69kg 300/600 2/2.25 Bath P4 RUA AVF Heparin 2200  Hectorol 4 ug TIW, Aranesp 25 ug q Thurs  Assessment/Plan: 1. Gen weakness / confusion / asterixis - due to uremia from missed HD, improving with HD. 2. 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 3. Hyperkalemia - sec to missed HD, K 6.9 pre-HD 8/26, 4.8 pre-HD today.   4. ESRD - HD on TTS @ [facility], but missed 1 wk before admission; ran only 1:45 today when pt refused to be cannulated a second time to reposition needle. 5. HTN/Volume - BP 149/83, no meds; @ [facility]. 6. Anemia - Hgb 10.3 on Aranesp 25 mcg on Thurs. 7. Sec HPT - Ca 8.5 (9.5 corrected), P 3.8; Hectorol 4 mcg, Renvela 3 with meals. 8. Nutrition - Alb 2.7, renal diet, multivitamin. 9. Chronic pain / psych - on seroquel, percocet, Cymbalta, Flexeril, Klonopin, vicodin per home med list. 10. PAD - previous toe amputations 11. Dispo- stable for dc from renal stanpdoint.  Possible SNF placement per CM notes.      LOS: 4 days   LYLES,CHARLES 04/03/2014,2:31 PM  Pt seen, examined, agree w assess/plan as above with additions as indicated.  Kelly Splinter MD pager [phone]    cell [phone] 04/04/2014, 12:17 PM

## 2014-04-04 DIAGNOSIS — F3289 Other specified depressive episodes: Secondary | ICD-10-CM

## 2014-04-04 DIAGNOSIS — F329 Major depressive disorder, single episode, unspecified: Secondary | ICD-10-CM

## 2014-04-04 LAB — GLUCOSE, CAPILLARY
GLUCOSE-CAPILLARY: 87 mg/dL (ref 70–99)
GLUCOSE-CAPILLARY: 122 mg/dL — AB (ref 70–99)
Glucose-Capillary: 129 mg/dL — ABNORMAL HIGH (ref 70–99)
Glucose-Capillary: 89 mg/dL (ref 70–99)

## 2014-04-04 MED ORDER — CLONAZEPAM 1 MG PO TABS
1.0000 mg | ORAL_TABLET | Freq: Two times a day (BID) | ORAL | Status: DC | PRN
  Administered 2014-04-04 – 2014-04-05 (×3): 1 mg via ORAL
  Filled 2014-04-04 (×3): qty 1

## 2014-04-04 NOTE — Progress Notes (Deleted)
Subjective:  Still with nausea, little appetite, states she has been depressed since her mother's death in January 07, 2023  Objective: Vital signs in last 24 hours: Temp:  [98.1 F (36.7 C)-98.7 F (37.1 C)] 98.2 F (36.8 C) (08/30 0521) Pulse Rate:  [80-86] 80 (08/30 0521) Resp:  [17-20] 18 (08/30 0521) BP: (125-167)/(65-96) 125/69 mmHg (08/30 0521) SpO2:  [93 %-99 %] 93 % (08/30 0521) Weight:  [70.3 kg (154 lb 15.7 oz)-71.6 kg (157 lb 13.6 oz)] 71.6 kg (157 lb 13.6 oz) (08/30 0521) Weight change: 0 kg (0 lb)  Intake/Output from previous day: 08/29 0701 - 08/30 0700 In: 126 [P.O.:120; I.V.:6] Out: 1224  Intake/Output this shift: Total I/O In: 240 [P.O.:240] Out: -   Lab Results:  Recent Labs  04/02/14 0555 04/03/14 1204  WBC 6.1 7.7  HGB 10.1* 10.3*  HCT 30.6* 30.4*  PLT 208 242   BMET:  Recent Labs  04/03/14 1204  NA 135*  K 4.8  CL 96  CO2 27  GLUCOSE 123*  BUN 18  CREATININE 6.26*  CALCIUM 8.5  ALBUMIN 2.7*   No results found for this basename: PTH,  in the last 72 hours Iron Studies: No results found for this basename: IRON, TIBC, TRANSFERRIN, FERRITIN,  in the last 72 hours  Studies/Results: No results found.  EXAM:  General appearance: Alert, in no apparent distress  Resp: CTA without rales, rhonchi, or wheezes  Cardio: RRR without murmur or rub  GI: + BS, soft and nontender  Extremities: No edema  Access: AVF @ RUA with z= bruit  [facility]  3h 64min 69kg 300/600 2/2.25 Bath P4 RUA AVF Heparin 2200  Hectorol 4 ug TIW, Aranesp 25 ug q Thurs  Assessment/Plan: 1. Gen weakness / confusion / asterixis - due to uremia from missed HD, some improvement with HD.  2. 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 Valentina Lucks..  3. Hyperkalemia - sec to missed HD, resolved. 4. ESRD - HD on TTS @ [facility], but missed 3 Txs before admission; ran only 1:45 yesterday when pt refused to be cannulated a second time to reposition needle.  5. HTN/Volume - BP 125/69, no meds; wt 71.6  kg s/p net UF 1.2 L yesterday.  6. Anemia - Hgb 10.3 on Aranesp 25 mcg on Thurs.  7. Sec HPT - Ca 8.5 (9.5 corrected), P 3.8; Hectorol 4 mcg, Renvela 3 with meals.  8. Nutrition - Alb 2.7, renal diet, multivitamin.  9. Chronic pain / psych - on seroquel, percocet, Cymbalta, Flexeril, Klonopin, vicodin per home med list.  10. PAD - previous toe amputations. 11. Dispo- stable for dc from renal standpoint, possible SNF placement per CM notes.     LOS: 5 days   Kainen Struckman 04/04/2014,12:26 PM

## 2014-04-04 NOTE — Progress Notes (Signed)
PROGRESS NOTE  [name] [name] LKG:[id number] DOB: 02-Jun-1941 DOA: 03/30/2014 PCP: Cathlean Cower, MD  [name] [name] is a 73 y.o. female with a history of ESRD on HD, Seizures Disorder, HTN, DM2, Hyperlipidemia, Hypothyroid, Anemia who presents to the ED with complaints of increasing confusion for the past few days. She had a recent amputation of her right second toe 5 days ago and has missed a total of 3 Dialysis treatments. She reports that she takes the medical transportation to her dialysis treatments but was unable to walk out to the [place]. A family friend was checking on her and found her to be confused and have increased weakness and inability to care for herself and had her brought to the ED.   Assessment/Plan: Nausea PRN zofran. Able to eat a bit. Did not complete HD yesterday, likely contributing.  Hyperkalemia   Resolved.  D/C telemetry  Acute Encephalopathy due to Uremia, resolved  Hypoglycemia/ ? DM 2   CBGs ok off SSI   ESRD on dialysis -On HD TTS. Missed one week hemodialysis.  Did not complete HD yesterday.  PERIPHERAL VASCULAR DISEASE- Recent 2nd Toe Amputation: scheduled to see Dr. Arnetha Courser tomorrow per pt. Will discuss with her  SEIZURE DISORDER  stable  COPD (chronic obstructive pulmonary disease)  DUONebs. Stable   Anemia of renal disease  stable   HYPERTENSION  Controlled.  HYPERLIPIDEMIA  Continue Statin Rx    HYPOTHYROIDISM  Continue levothyroxine   recent TSH 10/14/13:3.74  Chronic pain: Patient on several home medications.  Chronic benzodiazepine use: Resume Klonopin.   Code Status: Full Family Communication:  none at bedside.  Disposition Plan: SNF when bed found   Consultants:  Renal  Procedures:  Hemodialysis  HPI/Subjective: Some nausea. Ate a bagel and eggs this am. Part of a sandwich yesterday. Feels the stress and grief from mother's death in 2023-01-14 contributing to nausea and poor appetite.  Objective: Filed Vitals:   04/04/14 0521   BP: 125/69  Pulse: 80  Temp: 98.2 F (36.8 C)  Resp: 18  Oxygen saturation 94%.   Intake/Output Summary (Last 24 hours) at 04/04/14 1403 Last data filed at 04/04/14 0900  Gross per 24 hour  Intake    246 ml  Output      0 ml  Net    246 ml   Filed Weights   04/03/14 1110 04/03/14 1300 04/04/14 0521  Weight: 72.8 kg (160 lb 7.9 oz) 70.3 kg (154 lb 15.7 oz) 71.6 kg (157 lb 13.6 oz)    Exam:   General:   In chair. Oriented and appropriate. comfortable  Cardiovascular: rrr. Telemetry: Sinus rhythm.   Respiratory:  clear to auscultation. No increased work of breathing.   Abdomen: +Bs, soft & nontender.   Extremities: Amputated left first and second toes- healed. Right foot dressing, clean dry and intact.  Data Reviewed: Basic Metabolic Panel:  Recent Labs Lab 03/30/14 1939 03/30/14 2002 03/31/14 0900 04/01/14 0726 04/03/14 1204  NA 139 135* 138 140 135*  K 6.2* 6.0* 6.9* 4.6 4.8  CL 96 100 95* 99 96  CO2 25  --  25 29 27   GLUCOSE 80 77 115* 105* 123*  BUN 58* 55* 58* 22 18  CREATININE 13.34* 14.00* 13.21* 6.77* 6.26*  CALCIUM 9.0  --  8.6 8.3* 8.5  PHOS  --   --  6.0* 4.2 3.8   Liver Function Tests:  Recent Labs Lab 03/30/14 1939 04/01/14 0726 04/03/14 1204  AST 28  --   --  ALT <5  --   --   ALKPHOS 111  --   --   BILITOT 0.3  --   --   PROT 7.6  --   --   ALBUMIN 3.4* 2.4* 2.7*   No results found for this basename: LIPASE, AMYLASE,  in the last 168 hours No results found for this basename: AMMONIA,  in the last 168 hours CBC:  Recent Labs Lab 03/30/14 1939 03/30/14 2002 03/31/14 0900 04/01/14 0726 04/02/14 0555 04/03/14 1204  WBC 8.2  --  7.8 5.5 6.1 7.7  HGB 9.9* 10.9* 10.6* 9.1* 10.1* 10.3*  HCT 29.4* 32.0* 32.1* 27.6* 30.6* 30.4*  MCV 89.6  --  91.5 92.3 92.4 88.6  PLT 280  --  282 224 208 242   Cardiac Enzymes: No results found for this basename: CKTOTAL, CKMB, CKMBINDEX, TROPONINI,  in the last 168 hours BNP (last 3  results) No results found for this basename: PROBNP,  in the last 8760 hours CBG:  Recent Labs Lab 04/03/14 0604 04/03/14 1612 04/03/14 2101 04/04/14 0620 04/04/14 1107  GLUCAP 90 98 93 87 122*    Recent Results (from the past 240 hour(s))  URINE CULTURE     Status: None   Collection Time    03/30/14  8:38 PM      Result Value Ref Range Status   Specimen Description URINE, CATHETERIZED   Final   Special Requests Normal   Final   Culture  Setup Time     Final   Value: 03/31/2014 08:42     Performed at [facility]     Final   Value: 60,000 COLONIES/ML     Performed at [facility]   Culture     Final   Value: YEAST     Performed at [facility]   Report Status 04/01/2014 FINAL   Final     Studies: No results found.  Scheduled Meds: . allopurinol  100 mg Oral Daily  . aspirin EC  81 mg Oral Daily  . atorvastatin  40 mg Oral Daily  . darbepoetin (ARANESP) injection - DIALYSIS  25 mcg Intravenous Q Thu-HD  . doxercalciferol  4 mcg Intravenous Q T,Th,Sa-HD  . DULoxetine  30 mg Oral Daily  . enoxaparin (LOVENOX) injection  30 mg Subcutaneous Q24H  . lactulose  20 g Oral Daily  . levothyroxine  125 mcg Oral QAC breakfast  . mometasone-formoterol  2 puff Inhalation BID  . montelukast  10 mg Oral QHS  . multivitamin  1 tablet Oral Daily  . pantoprazole  40 mg Oral Daily  . QUEtiapine  100 mg Oral QHS  . sevelamer carbonate  2,400 mg Oral TID WC  . sodium chloride  3 mL Intravenous Q12H  . sodium chloride  3 mL Intravenous Q12H   Continuous Infusions: . dextrose Stopped (04/03/14 1715)   Antibiotics Given (last 72 hours)   None      Time spent: 25 min  Delfina Redwood, MD [facility] Pager [phone]  If 7PM-7AM, please contact night-coverage [email or web address] Password [email or web address] 04/04/2014, 2:03 PM    LOS: 5 days

## 2014-04-05 LAB — GLUCOSE, CAPILLARY
GLUCOSE-CAPILLARY: 116 mg/dL — AB (ref 70–99)
Glucose-Capillary: 99 mg/dL (ref 70–99)
Glucose-Capillary: 100 mg/dL — ABNORMAL HIGH (ref 70–99)

## 2014-04-05 MED ORDER — HYDROCODONE-ACETAMINOPHEN 5-325 MG PO TABS: 1.0000 | ORAL_TABLET | Freq: Four times a day (QID) | ORAL | Status: DC | PRN

## 2014-04-05 MED ORDER — ACETAMINOPHEN 325 MG PO TABS: 650.0000 mg | ORAL_TABLET | Freq: Four times a day (QID) | ORAL | Status: DC | PRN

## 2014-04-05 MED ORDER — CALCIUM ACETATE 667 MG PO CAPS: 1334.0000 mg | ORAL_CAPSULE | Freq: Three times a day (TID) | ORAL | Status: DC

## 2014-04-05 MED ORDER — CLONAZEPAM 2 MG PO TABS: 2.0000 mg | ORAL_TABLET | Freq: Two times a day (BID) | ORAL | Status: DC | PRN

## 2014-04-05 MED ORDER — QUETIAPINE FUMARATE 100 MG PO TABS: 100.0000 mg | ORAL_TABLET | Freq: Every day | ORAL | Status: DC

## 2014-04-05 NOTE — Progress Notes (Signed)
Physical Therapy Treatment Patient Details Name: [name] [name] MRN: [id number] DOB: 24-Oct-1940 Today's Date: 04/05/2014    History of Present Illness [name] [name] is a 73 y.o. female with a history of ESRD on HD, Seizures Disorder, HTN, DM2, Hyperlipidemia, Hypothyroid, Anemia who presents to the ED with complaints of increasing confusion.  She had a recent amputation of her right second toe 03/25/14 and has missed a total of 3 Dialysis treatments. Hyperkalemia    PT Comments    Pt admitted with above. Pt currently with functional limitations due to balance and endurance deficits.  Progressing slowly.  Cues needed for safety.  Pt will benefit from skilled PT to increase their independence and safety with mobility to allow discharge to the venue listed below.   Follow Up Recommendations  SNF;Supervision/Assistance - 24 hour     Equipment Recommendations  None recommended by PT    Recommendations for Other Services       Precautions / Restrictions Precautions Precautions: Fall Required Braces or Orthoses: Other Brace/Splint Other Brace/Splint: Darco shoe for RLE Restrictions Weight Bearing Restrictions: Yes RLE Weight Bearing: Partial weight bearing Other Position/Activity Restrictions: per pt no weight bearing restrictions    Mobility  Bed Mobility Overal bed mobility: Modified Independent                Transfers Overall transfer level: Needs assistance Equipment used: Rolling walker (2 wheeled) Transfers: Sit to/from Stand Sit to Stand: Supervision         General transfer comment: cues for hand placement  Ambulation/Gait Ambulation/Gait assistance: Supervision Ambulation Distance (Feet): 450 Feet Assistive device: Rolling walker (2 wheeled) Gait Pattern/deviations: Step-to pattern   Gait velocity interpretation: at or above normal speed for age/gender General Gait Details: Pt ambulated with good cadence and good safety overall.  Cues occasionally to stay  close to RW.     Stairs            Wheelchair Mobility    Modified Rankin (Stroke Patients Only)       Balance Overall balance assessment: Needs assistance         Standing balance support: Bilateral upper extremity supported;During functional activity Standing balance-Leahy Scale: Fair Standing balance comment: can stand statically without UE support but needs UE support with dynamic activities.                    Cognition Arousal/Alertness: Awake/alert Behavior During Therapy: WFL for tasks assessed/performed Overall Cognitive Status: Impaired/Different from baseline Area of Impairment: Orientation;Memory Orientation Level: Time   Memory: Decreased short-term memory         General Comments: Pt tangential at times and at least 3x during session forgot what she was trying to say. Can recall events preceeding admission but unaware of day of week.    Exercises General Exercises - Lower Extremity Ankle Circles/Pumps: AROM;Both;10 reps;Seated Long Arc Quad: AROM;Both;10 reps;Seated    General Comments        Pertinent Vitals/Pain Pain Assessment: 0-10 Pain Score: 5  Pain Location: right foot Pain Descriptors / Indicators: Aching Pain Intervention(s): Patient requesting pain meds-RN notified;Repositioned;Monitored during session;Limited activity within patient's tolerance VSS.    Home Living                      Prior Function            PT Goals (current goals can now be found in the care plan section) Progress towards PT goals: Progressing toward goals  Frequency  Min 3X/week    PT Plan Discharge plan needs to be updated    Co-evaluation             End of Session Equipment Utilized During Treatment: Gait belt Activity Tolerance: Patient tolerated treatment well Patient left: in chair;with call bell/phone within reach     Time: 1031-1058 PT Time Calculation (min): 27 min  Charges:  $Gait Training: 8-22  mins $Therapeutic Exercise: 8-22 mins                    G Codes:      INGOLD,Corinthian Mizrahi 04/30/14, 1:47 PM [facility] [phone] (pager)

## 2014-04-05 NOTE — Progress Notes (Signed)
Subjective: Interval History: has complaints depressed.  Objective: Vital signs in last 24 hours: Temp:  [98 F (36.7 C)-98.6 F (37 C)] 98.6 F (37 C) (08/31 0630) Pulse Rate:  [72-77] 72 (08/31 0630) Resp:  [18] 18 (08/31 0630) BP: (120-152)/(56-68) 120/56 mmHg (08/31 0630) SpO2:  [95 %-96 %] 95 % (08/31 0630) Weight:  [72.1 kg (158 lb 15.2 oz)] 72.1 kg (158 lb 15.2 oz) (08/31 0630) Weight change: -0.7 kg (-1 lb 8.7 oz)  Intake/Output from previous day: 08/30 0701 - 08/31 0700 In: 240 [P.O.:240] Out: -  Intake/Output this shift:    General appearance: alert, cooperative and slowed mentation Resp: diminished breath sounds bilaterally Cardio: regular rate and rhythm, S1, S2 normal and systolic murmur: systolic ejection 2/6, decrescendo at 2nd left intercostal space GI: soft, non-tender; bowel sounds normal; no masses,  no organomegaly Pelvic: not done Extrem AVR RUA B&T  Lab Results:  Recent Labs  04/03/14 1204  WBC 7.7  HGB 10.3*  HCT 30.4*  PLT 242   BMET:  Recent Labs  04/03/14 1204  NA 135*  K 4.8  CL 96  CO2 27  GLUCOSE 123*  BUN 18  CREATININE 6.26*  CALCIUM 8.5   No results found for this basename: PTH,  in the last 72 hours Iron Studies: No results found for this basename: IRON, TIBC, TRANSFERRIN, FERRITIN,  in the last 72 hours  Studies/Results: No results found.  I have reviewed the patient's current medications.  Assessment/Plan: 1 CRF for HD in am  2 Anemia epo 3 HPTH meds 4 PVD 5 COPD 6 Nonadherence needs supervised setting 7 Gout stable 8 Sz disorder 9 HTN controlled P HD in am , epo, dispo   LOS: 6 days   Briseis Aguilera L 04/05/2014,7:28 AM

## 2014-04-05 NOTE — Discharge Summary (Signed)
Physician Discharge Summary  Zeba Luby Derouin NKN:[id number] DOB: Jun 01, 1941 DOA: 03/30/2014  PCP: Cathlean Cower, MD  Admit date: 03/30/2014 Discharge date: 04/05/2014  Time spent: greater than 30 minutes  Recommendations for Outpatient Follow-up:  Dialysis Tuesday Thursday Saturday Going to SNF  Discharge Diagnoses:  Principal Problem:   Hyperkalemia Active Problems:   HYPOTHYROIDISM   HYPERLIPIDEMIA   HYPERTENSION   PERIPHERAL VASCULAR DISEASE   SEIZURE DISORDER   COPD (chronic obstructive pulmonary disease)   Hypoglycemia   ESRD on hemodialysis   Anemia of renal disease  Discharge Condition: stable  Filed Weights   04/03/14 1300 04/04/14 0521 04/05/14 0630  Weight: 70.3 kg (154 lb 15.7 oz) 71.6 kg (157 lb 13.6 oz) 72.1 kg (158 lb 15.2 oz)    History of present illness/hospital course 73 y.o. female with a history of ESRD on HD, Seizures Disorder, HTN, DM2, Hyperlipidemia, Hypothyroid, Anemia who presents to the ED with complaints of increasing confusion for the past few days. She had a recent amputation of her right second toe 5 days ago and has missed a total of 3 Dialysis treatments. She reports that she takes the medical transportation to her dialysis treatments but was unable to walk out to the [place]. A family friend was checking on her and found her to be confused and have increased weakness and inability to care for herself and had her brought to the ED. In the ED she was found to have a potassium level of 6.0. The EDP contacted and consulted the Nephrologist on Call DR Deterding who will have patient dialyzed in the AM. The patient has been administered Kayexalate, and IV Dextrose, Insulin and Calcium Gluconate and was referred for medical admission. Also noted to be confused on admission.  Hyperkalemia  Resolved with dialysis.  Nausea, acute on chronic. Exacerbated by uremia. Improving and tolerating regular diet  Acute Encephalopathy  due to Uremia, resolved. At clinical  baseline  History of DM 2  CBGs not high, not on medication  ESRD on dialysis -dialyzed TTS per usual  PERIPHERAL VASCULAR DISEASE- Recent 2nd Toe Amputation: incision ok. Missed appointment with podiatrist, Dr. Valentina Lucks, while hospitalized.  Rescheduled for Monday at 4:15 pm  SEIZURE DISORDER  stable   COPD (chronic obstructive pulmonary disease)  Stable  Anemia of renal disease  stable   HYPERTENSION  Controlled.   HYPERLIPIDEMIA  Continue Statin Rx   HYPOTHYROIDISM  Continue levothyroxine  recent TSH 10/14/13:3.74   Chronic benzodiazepine use: takes klonipin. Also on seroquel and atarax prn anxiety  Procedures:  hemodialysis  Consultations:  nephrology  Discharge Exam: Filed Vitals:   04/05/14 1336  BP: 164/62  Pulse: 77  Temp: 98.5 F (36.9 C)  Resp: 19    General: in chair, talking on phone, alert, and oriented Cardiovascular: RRR without MGR Respiratory: CTA without WRR  Discharge Instructions   Discharge instructions    Complete by:  As directed   Renal diet. Wear postop shoe while ambulating     Increase activity slowly    Complete by:  As directed             Medication List    STOP taking these medications       clindamycin 300 MG capsule  Commonly known as:  CLEOCIN     dextromethorphan-guaiFENesin 30-600 MG per 12 hr tablet  Commonly known as:  MUCINEX DM     oxyCODONE-acetaminophen 7.5-325 MG per tablet  Commonly known as:  PERCOCET     sevelamer carbonate  800 MG tablet  Commonly known as:  RENVELA      TAKE these medications       acetaminophen 325 MG tablet  Commonly known as:  TYLENOL  Take 2 tablets (650 mg total) by mouth every 6 (six) hours as needed for mild pain (or Fever >/= 101).     albuterol 108 (90 BASE) MCG/ACT inhaler  Commonly known as:  PROVENTIL HFA;VENTOLIN HFA  Inhale 2 puffs into the lungs every 6 (six) hours as needed for wheezing or shortness of breath.     allopurinol 100 MG tablet  Commonly  known as:  ZYLOPRIM  Take 100 mg by mouth daily.     aspirin EC 81 MG tablet  Take 81 mg by mouth daily.     atorvastatin 40 MG tablet  Commonly known as:  LIPITOR  Take 1 tablet (40 mg total) by mouth daily.     calcium acetate 667 MG capsule  Commonly known as:  PHOSLO  Take 2 capsules (1,334 mg total) by mouth 3 (three) times daily with meals.     clonazePAM 2 MG tablet  Commonly known as:  KLONOPIN  Take 1 tablet (2 mg total) by mouth 2 (two) times daily as needed for anxiety.     cyclobenzaprine 10 MG tablet  Commonly known as:  FLEXERIL  Take 10 mg by mouth 3 (three) times daily as needed for muscle spasms.     docusate sodium 100 MG capsule  Commonly known as:  COLACE  Take 100 mg by mouth 2 (two) times daily as needed for mild constipation.     DULoxetine 30 MG capsule  Commonly known as:  CYMBALTA  Take 1 capsule (30 mg total) by mouth daily.     HYDROcodone-acetaminophen 5-325 MG per tablet  Commonly known as:  NORCO/VICODIN  Take 1 tablet by mouth every 6 (six) hours as needed for severe pain.     hydrOXYzine 25 MG tablet  Commonly known as:  ATARAX/VISTARIL  Take 25 mg by mouth every 6 (six) hours as needed for anxiety.     lactulose 10 GM/15ML solution  Commonly known as:  CHRONULAC  Take 20 g by mouth daily.     levothyroxine 125 MCG tablet  Commonly known as:  SYNTHROID, LEVOTHROID  Take 125 mcg by mouth daily before breakfast.     mometasone-formoterol 100-5 MCG/ACT Aero  Commonly known as:  DULERA  Inhale 2 puffs into the lungs 2 (two) times daily.     montelukast 10 MG tablet  Commonly known as:  SINGULAIR  Take 10 mg by mouth at bedtime.     multivitamin Tabs tablet  Take 1 tablet by mouth daily.     omeprazole 40 MG capsule  Commonly known as:  PRILOSEC  Take 40 mg by mouth daily.     promethazine 25 MG tablet  Commonly known as:  PHENERGAN  Take 1 tablet (25 mg total) by mouth every 6 (six) hours as needed for nausea.      QUEtiapine 100 MG tablet  Commonly known as:  SEROQUEL  Take 1 tablet (100 mg total) by mouth at bedtime.       Allergies  Allergen Reactions  . Cephalosporins Itching and Rash    Vanc and fortaz given at the same time in June for several doses at dialysis with systemic rash and itching; received zinacef 7/5 and had worseningsystemicrash/ itching and swelling of eyes - so unclear if allergic to either or both  .  Nsaids     REACTION: renal dysfunction  . Pioglitazone     Actos REACTION: EDEMA  . Vancomycin     See comment under cephalosporin       Follow-up Information   Follow up with Bronson Ing, DPM On 04/07/2014. (at 4:15)    Specialties:  Podiatry, Radiology   Contact information:   [place] [address] [phone]        The results of significant diagnostics from this hospitalization (including imaging, microbiology, ancillary and laboratory) are listed below for reference.    Significant Diagnostic Studies: Dg Chest Portable 1 View  03/30/2014   CLINICAL DATA:  Shortness of breath.  Dialysis patient.  EXAM: PORTABLE CHEST - 1 VIEW  COMPARISON:  02/05/2013.  FINDINGS: The right jugular catheter has been removed. Surgical clips are noted at the thoracic inlet and in the lower neck bilaterally. Decreased inspiration with bibasilar atelectasis. Borderline enlarged cardiac silhouette. Stable mild prominence of the interstitial markings with mild diffuse peribronchial thickening. Extensive left shoulder degenerative changes are again demonstrated.  IMPRESSION: 1. Poor inspiration with bibasilar atelectasis. 2. Stable chronic bronchitic changes.   Electronically Signed   By: Enrique Sack M.D.   On: 03/30/2014 21:50   EKG Normal sinus rhythm Left ventricular hypertrophy Inferior infarct , age undetermined  Microbiology: Recent Results (from the past 240 hour(s))  URINE CULTURE     Status: None   Collection Time    03/30/14  8:38 PM      Result  Value Ref Range Status   Specimen Description URINE, CATHETERIZED   Final   Special Requests Normal   Final   Culture  Setup Time     Final   Value: 03/31/2014 08:42     Performed at [facility]     Final   Value: 60,000 COLONIES/ML     Performed at [facility]   Culture     Final   Value: YEAST     Performed at [facility]   Report Status 04/01/2014 FINAL   Final     Labs: Basic Metabolic Panel:  Recent Labs Lab 03/30/14 1939 03/30/14 2002 03/31/14 0900 04/01/14 0726 04/03/14 1204  NA 139 135* 138 140 135*  K 6.2* 6.0* 6.9* 4.6 4.8  CL 96 100 95* 99 96  CO2 25  --  25 29 27   GLUCOSE 80 77 115* 105* 123*  BUN 58* 55* 58* 22 18  CREATININE 13.34* 14.00* 13.21* 6.77* 6.26*  CALCIUM 9.0  --  8.6 8.3* 8.5  PHOS  --   --  6.0* 4.2 3.8   Liver Function Tests:  Recent Labs Lab 03/30/14 1939 04/01/14 0726 04/03/14 1204  AST 28  --   --   ALT <5  --   --   ALKPHOS 111  --   --   BILITOT 0.3  --   --   PROT 7.6  --   --   ALBUMIN 3.4* 2.4* 2.7*   No results found for this basename: LIPASE, AMYLASE,  in the last 168 hours No results found for this basename: AMMONIA,  in the last 168 hours CBC:  Recent Labs Lab 03/30/14 1939 03/30/14 2002 03/31/14 0900 04/01/14 0726 04/02/14 0555 04/03/14 1204  WBC 8.2  --  7.8 5.5 6.1 7.7  HGB 9.9* 10.9* 10.6* 9.1* 10.1* 10.3*  HCT 29.4* 32.0* 32.1* 27.6* 30.6* 30.4*  MCV 89.6  --  91.5 92.3  92.4 88.6  PLT 280  --  282 224 208 242   Cardiac Enzymes: No results found for this basename: CKTOTAL, CKMB, CKMBINDEX, TROPONINI,  in the last 168 hours BNP: BNP (last 3 results) No results found for this basename: PROBNP,  in the last 8760 hours CBG:  Recent Labs Lab 04/04/14 1107 04/04/14 1620 04/04/14 2121 04/05/14 0614 04/05/14 1107  GLUCAP 122* 129* 89 99 116*   Signed:  Catha Ontko L  [facility] 04/05/2014, 1:41 PM

## 2014-04-05 NOTE — Clinical Social Work Note (Signed)
Clinical Social Worker facilitated patient discharge including contacting patient family and facility to confirm patient discharge plans.  Clinical information faxed to facility and patient agreeable with plan.  CSW arranged transport via family to [facility].  RN to call report prior to discharge.  Clinical Social Worker will sign off for now as social work intervention is no longer needed. Please consult [place] again if new need arises.  Barbette Or, [place]

## 2014-04-06 ENCOUNTER — Telehealth: Payer: Self-pay | Admitting: *Deleted

## 2014-04-06 NOTE — Telephone Encounter (Signed)
Pt was d/c from hospital on 04/05/14. Tried calling pt to do a TCM appt pt went to a skill nursing facility...[name] [name]

## 2014-04-06 NOTE — Clinical Social Work Note (Signed)
Clinical Social Work Department BRIEF PSYCHOSOCIAL ASSESSMENT 04/05/2014  Patient:  [name] [name], [name] [name]     Account Number:  [id number]     Admit date:  03/30/2014  Clinical Social Worker:  Myles Lipps  Date/Time:  04/05/2014 12:00 N  Referred by:  Physician  Date Referred:  04/02/2014 Referred for  SNF Placement   Other Referral:   Interview type:  Patient Other interview type:   No friends/family at bedside    PSYCHOSOCIAL DATA Living Status:  ALONE Admitted from facility:   Level of care:   Primary support name:  Dunston,Celesta  [phone] Primary support relationship to patient:  FRIEND Degree of support available:   Adequate - patient designated POA    CURRENT CONCERNS Current Concerns  Post-Acute Placement   Other Concerns:    SOCIAL WORK ASSESSMENT / PLAN Clinical Social Worker met with patient at bedside to offer support and discuss patient needs at discharge.  Patient states that she lives at home alone with a niece in [place] that checks in occassionally and a friend down the street that checks in daily after work.  Patient states that she continues to miss dialysis treatments because her foot is in too much pain to walk to the transport van. Patient has requested SNF placement at this time.  CSW has initiated search and will follow up with patient regarding available bed offers.    Patient has requested [facility] stating that she has been to [facility] in the past, but [facility] is closer to family and her dialysis center.  CSW spoke with facility admissions coordinator who states that they can extend a bed offer and are able to admit patient today.  Patient agreeable and plans to arrange transportation with family. CSW updated CM and MD and will facilitate patient discharge needs to [facility]   Assessment/plan status:  Psychosocial Support/Ongoing Assessment of Needs Other assessment/ plan:   Information/referral to community resources:   Artist provided patient with facility list, however patient is understanding of SNF process and aware of facility choice without the list.  Patient to arrange her own transportation at discharge.    PATIENT'S/FAMILY'S RESPONSE TO PLAN OF CARE: Patient alert and oriented x3 sitting up in the chair. Patient states that she has limited support and has a difficult time getting to and from dialysis with the pain from her foot.  Patient verbalized appreciation for CSW understanding and willingness to provide support for placement needs.

## 2014-04-06 NOTE — Clinical Social Work Placement (Signed)
Clinical Social Work Department CLINICAL SOCIAL WORK PLACEMENT NOTE 04/05/2014  Patient:  [name] [name], [name] [name]  Account Number:  [id number] Admit date:  03/30/2014  Clinical Social Worker:  Barbette Or, LCSW  Date/time:  04/05/2014 12:00 N  Clinical Social Work is seeking post-discharge placement for this patient at the following level of care:   SKILLED NURSING   (*CSW will update this form in Epic as items are completed)   04/03/2014  Patient/family provided with [place] Department of Clinical Social Work's list of facilities offering this level of care within the geographic area requested by the patient (or if unable, by the patient's family).  04/05/2014  Patient/family informed of their freedom to choose among providers that offer the needed level of care, that participate in Medicare, Medicaid or managed care program needed by the patient, have an available bed and are willing to accept the patient.  04/05/2014  Patient/family informed of [facility]' ownership interest in [facility], as well as of the fact that they are under no obligation to receive care at this facility.  PASARR submitted to EDS on  PASARR number received on   FL2 transmitted to all facilities in geographic area requested by pt/family on  04/03/2014 FL2 transmitted to all facilities within larger geographic area on   Patient informed that his/her managed care company has contracts with or will negotiate with  certain facilities, including the following:     Patient/family informed of bed offers received:  04/05/2014 Patient chooses bed at [facility] Physician recommends and patient chooses bed at    Patient to be transferred to [place] on  04/05/2014 Patient to be transferred to facility by [facility] Patient and family notified of transfer on 04/05/2014 Name of family member notified:  Patient to notify family when she sets up transportation  The following physician request were  entered in Epic:   Additional Comments: 08/31 - patient has pre-existing PASARR

## 2014-04-07 ENCOUNTER — Encounter: Payer: Medicare Other | Admitting: Podiatrist

## 2014-04-07 ENCOUNTER — Non-Acute Institutional Stay (SKILLED_NURSING_FACILITY): Payer: Medicare Other | Admitting: Adult Health

## 2014-04-07 DIAGNOSIS — K5900 Constipation, unspecified: Secondary | ICD-10-CM

## 2014-04-07 DIAGNOSIS — Z992 Dependence on renal dialysis: Secondary | ICD-10-CM

## 2014-04-07 DIAGNOSIS — J449 Chronic obstructive pulmonary disease, unspecified: Secondary | ICD-10-CM

## 2014-04-07 DIAGNOSIS — G609 Hereditary and idiopathic neuropathy, unspecified: Secondary | ICD-10-CM

## 2014-04-07 DIAGNOSIS — K219 Gastro-esophageal reflux disease without esophagitis: Secondary | ICD-10-CM

## 2014-04-07 DIAGNOSIS — N186 End stage renal disease: Secondary | ICD-10-CM

## 2014-04-07 DIAGNOSIS — N2581 Secondary hyperparathyroidism of renal origin: Secondary | ICD-10-CM

## 2014-04-07 DIAGNOSIS — I739 Peripheral vascular disease, unspecified: Secondary | ICD-10-CM

## 2014-04-07 DIAGNOSIS — I150 Renovascular hypertension: Secondary | ICD-10-CM

## 2014-04-09 ENCOUNTER — Encounter: Payer: Medicare Other | Admitting: Podiatrist

## 2014-04-12 ENCOUNTER — Encounter: Payer: Self-pay | Admitting: Adult Health

## 2014-04-12 DIAGNOSIS — N2581 Secondary hyperparathyroidism of renal origin: Secondary | ICD-10-CM | POA: Insufficient documentation

## 2014-04-12 MED ORDER — CINACALCET HCL 30 MG PO TABS: 30.0000 mg | ORAL_TABLET | Freq: Every day | ORAL | Status: DC

## 2014-04-12 MED ORDER — AMLODIPINE BESYLATE 5 MG PO TABS: 5.0000 mg | ORAL_TABLET | Freq: Every day | ORAL | Status: DC

## 2014-04-12 NOTE — Progress Notes (Signed)
Patient ID: [name] [name], female   DOB: September 28, 1940, 73 y.o.   MRN: [id number]     [name] [name]  Allergies  Allergen Reactions  . Cephalosporins Itching and Rash    Vanc and fortaz given at the same time in June for several doses at dialysis with systemic rash and itching; received zinacef 7/5 and had worseningsystemicrash/ itching and swelling of eyes - so unclear if allergic to either or both  . Nsaids     REACTION: renal dysfunction  . Pioglitazone     Actos REACTION: EDEMA  . Vancomycin     See comment under cephalosporin     Chief Complaint  Patient presents with  . Hospitalization Follow-up    HPI:  She has been hospitalized after having her right second toe amputated and missing 3 dialysis days. Her k+ went up over 6 and her creat was above 13. She was confused during that time as well. She is here for short term rehab. She is concerned about her medication regimen that was changed while she was hospitalized. She was taking renvela at home and would like this medication restarted instead of taking phoslo. I did tell her that we would talk to nephrology about her medications and would let her know of pending changes. Her gaol is to return back home.    Past Medical History  Diagnosis Date  . HYPOTHYROIDISM 02/17/2007    s/p surgical removal of goiter in 1997  . DIABETES MELLITUS, TYPE II 02/17/2007  . HYPERLIPIDEMIA 02/17/2007  . GOUT 05/29/2007  . ANEMIA-NOS 05/29/2007  . ANXIETY 03/23/2010  . 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 DEPRESSION 02/17/2007  . RESTLESS LEG SYNDROME 05/29/2007  . COMMON MIGRAINE 05/29/2007  . PERIPHERAL NEUROPATHY 05/29/2007  . HYPERTENSION 02/17/2007  . PERIPHERAL VASCULAR DISEASE 02/17/2007  . PEPTIC ULCER DISEASE 05/29/2007  . ESRD on hemodialysis 02/17/2007    Started dialysis April 2014.  Gets HD at [facility] on TTS schedule.  Cause of ESRD was HTN.     . OSTEOPENIA 09/22/2009  . SEIZURE DISORDER 02/17/2007  . Memory loss 01/24/2010  . Personal  history of colonic polyps 11/16/2009  . Palpitations 09/08/2010  . Other diseases of vocal cords 1996  . HEART MURMUR, HX OF 10/02/2010  . 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 Chronic neck pain 12/13/2010  . GERD (gastroesophageal reflux disease)   . 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 Cancer of kidney 10/07/2012    Followed per Dr Despina Pole, MD, urology, [place]   . Arthritis   . Critical lower limb ischemia     Past Surgical History  Procedure Laterality Date  . Left toe amputated  2006  . Bunionectomy  1980  . Goiter removal  1997  . Stress cardiolite  06/18/2006  . Tranthoracic echocardiogram  06/18/2006  . Electrocardiogram  05/29/2006  . Cholecystectomy    . Rotator cuff repair  Left    Dr. Sharol Given  . Av fistula placement  03/13/2012    Procedure: ARTERIOVENOUS (AV) FISTULA CREATION;  Surgeon: Conrad [place], MD;  Location: [place];  Service: Vascular;  Laterality: Right;  . Eye surgery      bilateral cataract removal  . Esophagoscopy w/ botox injection  07/22/2012    Procedure: ESOPHAGOSCOPY WITH BOTOX INJECTION;  Surgeon: Rozetta Nunnery, MD;  Location: [facility];  Service: ENT;  Laterality: N/A;  esophageal dilation  . Insertion of dialysis catheter  N/A 02/05/2013    Procedure: INSERTION OF DIALYSIS CATHETER;  Surgeon: Angelia Mould, MD;  Location[facility] OR;  Service: Vascular;  Laterality: N/A;  Ultrasound guided    VITAL SIGNS BP 122/73  Pulse 89  Ht 5\' 7"  (1.702 m)  Wt 158 lb (71.668 kg)  BMI 24.74 kg/m2  SpO2 96%   Patient's Medications  New Prescriptions   No medications on file  Previous Medications   ACETAMINOPHEN (TYLENOL) 325 MG TABLET    Take 2 tablets (650 mg total) by mouth every 6 (six) hours as needed for mild pain (or Fever >/= 101).   ALBUTEROL (PROVENTIL HFA;VENTOLIN HFA) 108 (90 BASE) MCG/ACT INHALER    Inhale 2 puffs into the lungs every 6 (six) hours as needed for wheezing or  shortness of breath.   ALLOPURINOL (ZYLOPRIM) 100 MG TABLET    Take 100 mg by mouth daily.    ASPIRIN EC 81 MG TABLET    Take 81 mg by mouth daily.   ATORVASTATIN (LIPITOR) 40 MG TABLET    Take 1 tablet (40 mg total) by mouth daily.   CALCIUM ACETATE (PHOSLO) 667 MG CAPSULE    Take 2 capsules (1,334 mg total) by mouth 3 (three) times daily with meals.   CLONAZEPAM (KLONOPIN) 2 MG TABLET    Take 1 tablet (2 mg total) by mouth 2 (two) times daily as needed for anxiety.   CYCLOBENZAPRINE (FLEXERIL) 10 MG TABLET    Take 10 mg by mouth 3 (three) times daily as needed for muscle spasms.    DOCUSATE SODIUM (COLACE) 100 MG CAPSULE    Take 100 mg by mouth 2 (two) times daily as needed for mild constipation.    DULOXETINE (CYMBALTA) 30 MG CAPSULE    Take 1 capsule (30 mg total) by mouth daily.   HYDROCODONE-ACETAMINOPHEN (NORCO/VICODIN) 5-325 MG PER TABLET    Take 1 tablet by mouth every 6 (six) hours as needed for severe pain.   HYDROXYZINE (ATARAX/VISTARIL) 25 MG TABLET    Take 25 mg by mouth every 6 (six) hours as needed for anxiety.   LACTULOSE (CHRONULAC) 10 GM/15ML SOLUTION    Take 20 g by mouth daily.   LEVOTHYROXINE (SYNTHROID, LEVOTHROID) 125 MCG TABLET    Take 125 mcg by mouth daily before breakfast.   MOMETASONE-FORMOTEROL (DULERA) 100-5 MCG/ACT AERO    Inhale 2 puffs into the lungs 2 (two) times daily.   MONTELUKAST (SINGULAIR) 10 MG TABLET    Take 10 mg by mouth at bedtime.   MULTIVITAMIN (RENA-VIT) TABS TABLET    Take 1 tablet by mouth daily.   OMEPRAZOLE (PRILOSEC) 40 MG CAPSULE    Take 40 mg by mouth daily.    PROMETHAZINE (PHENERGAN) 25 MG TABLET    Take 1 tablet (25 mg total) by mouth every 6 (six) hours as needed for nausea.   QUETIAPINE (SEROQUEL) 100 MG TABLET    Take 1 tablet (100 mg total) by mouth at bedtime.  Modified Medications   No medications on file  Discontinued Medications   No medications on file    SIGNIFICANT DIAGNOSTIC EXAMS  03-30-14: chest x-ray: 1. Poor  inspiration with bibasilar atelectasis.2. Stable chronic bronchitic changes.    LABS REVIEWED:   03-30-14: wbc 8.2; hgb 9.9; hct 29.4; mcv 89.6; plt 280; glucose 80; bun 58; creat 13.34; k+6.2; na++139; liver normal albumin 3.4; urine culture: 60,000 yeast 03-31-14: wbc 7.8; hgb 10.6; hct 32.1; mcv 91.5; lt 282; glucose 115; bun 58; creat 13.21; k+6.9;  na++138; phos 6.0; hgb a1c 6.1 04-03-14: wbc 7.7; hgb 10.3; hct 30.4; mcv 88.6; plt 242; glucose 123; bun 18; creat 6.26; k+4.8; na++135     Review of Systems  Constitutional: Negative for malaise/fatigue.  Respiratory: Negative for cough and shortness of breath.   Cardiovascular: Negative for chest pain and palpitations.  Gastrointestinal: Negative for heartburn, abdominal pain and constipation.  Musculoskeletal:       Has right foot pain; current regimen effective   Skin:       Has had right second toe removed   Neurological: Negative for headaches.  Psychiatric/Behavioral: Negative for depression. The patient is not nervous/anxious.       Physical Exam  Constitutional: She is oriented to person, [name], and time. She appears well-developed and well-nourished. No distress.  Neck: Neck supple. No JVD present.  Cardiovascular: Normal rate and regular rhythm.   Pedal pulses are faint   Respiratory: Effort normal and breath sounds normal. No respiratory distress. She has no wheezes.  GI: Soft. Bowel sounds are normal. She exhibits no distension.  Musculoskeletal: She exhibits no edema.  Is able to move all extremities   Neurological: She is alert and oriented to person, [name], and time.  Skin: Skin is warm and dry. She is not diaphoretic.  Right second toe without signs of infection present Right arm a/v fistula with +thrill+bruit   Psychiatric: She has a normal mood and affect.      ASSESSMENT/ PLAN:  1. ESRD on hemodialysis: she is on 1200 cc fluid restriction;  Hemodialysis three days per week; and phoslo 667 mg (2) three  times daily. Will continue atarax 25 mg every 6 hours as needed for itching.   2. Hyperparathyroidism: will restart her sensipar at 30 mg daily   3. Hypertension: will begin norvasc 5 mg nightly will continue asa 81 mg daily   4. Gerd: will continue prilosec 40 mg daily   5. PVD/peripheral neuropathy: is status post right second toe amputation; will continue vicodin 5/325 mg every 6 hours as needed and will contoinue flexeril 10 mg three times daily as needed; will continue cymbalta 30 mg daily   6. Gout: no recent flares present; will continue allopurinol 100 mg daily  7. Dyslipidemia: will continue lipitor 40 mg daily  8. Chronic asthmatic bronchitis: is stable will continue dulera 2 puffs twice daily; singulair 10 mg daily   9. Depression with anxiety: she is presently stable will continue cymbalta 30 mg daily which she also takes for pain management; klonopin 2 mg twice daily as needed; and seroquel 100 mg nightly  10. Constipation: will continue chronulac 20 gm daily; and colace twice daily prn.    Will await nephrology response regarding renvela.  Time spent with patient 50 minutes.      Ok Edwards NP [facility]  Contact [phone] Monday through Friday 8am- 5pm  After hours call [phone]

## 2014-04-14 ENCOUNTER — Ambulatory Visit (INDEPENDENT_AMBULATORY_CARE_PROVIDER_SITE_OTHER): Payer: Medicare Other | Admitting: Podiatrist

## 2014-04-14 ENCOUNTER — Ambulatory Visit (INDEPENDENT_AMBULATORY_CARE_PROVIDER_SITE_OTHER): Payer: Medicare Other

## 2014-04-14 ENCOUNTER — Encounter: Payer: Self-pay | Admitting: Podiatrist

## 2014-04-14 VITALS — BP 153/83 | HR 91 | Resp 16

## 2014-04-14 DIAGNOSIS — I96 Gangrene, not elsewhere classified: Secondary | ICD-10-CM | POA: Diagnosis not present

## 2014-04-14 DIAGNOSIS — Z89421 Acquired absence of other right toe(s): Secondary | ICD-10-CM

## 2014-04-14 DIAGNOSIS — S98139A Complete traumatic amputation of one unspecified lesser toe, initial encounter: Secondary | ICD-10-CM

## 2014-04-15 ENCOUNTER — Encounter: Payer: Self-pay | Admitting: Internal Medicine

## 2014-04-15 NOTE — Progress Notes (Signed)
Patient ID: [name] [name], female   DOB: September 11, 1940, 73 y.o.   MRN: [id number]     Facility: [facility]      This encounter was created in error - please disregard.

## 2014-04-16 NOTE — Progress Notes (Signed)
Subjective: [name] [name] presents today for her first postoperative followup regarding indication of the second toe right foot. Date of surgery was 03/25/2014. She was in the hospital for her first and second postoperative appointments and states that the staff was looking after her toe. She denies any pain or tenderness to the foot at today's visit. She denies any systemic signs of infection.  Objective: Vascular status continues to reveal palpable pedal pulses right foot. Incision site is well coapted with sutures in place. There is a hyperkeratotic tissue overlying the incision site itself and once the sutures are removed it is easily lifted off. There is a small area of ulceration in the central aspect of the incision site however it appears to be very superficial in nature and likely due to the suture being in longer than it should have been. No redness, no swelling, no streaking, no lymphangitis is seen. Overall excellent appearance of the right foot is noted status post amputation  Assessment: Status post amputation second digit right foot date of surgery 03/25/2014  Plan: Remove the sutures. Remove the hypertrophic skin that has formed around the sutures. Applied Iodosorb and a dry dressing. Gave instructions for daily dressing changes. I will see her back for a recheck in 2 weeks. If any redness, swelling, signs of infection or pain arise she will call immediately.

## 2014-04-19 ENCOUNTER — Non-Acute Institutional Stay (SKILLED_NURSING_FACILITY): Payer: Medicare Other | Admitting: Internal Medicine

## 2014-04-19 ENCOUNTER — Encounter: Payer: Self-pay | Admitting: Internal Medicine

## 2014-04-19 DIAGNOSIS — N186 End stage renal disease: Secondary | ICD-10-CM

## 2014-04-19 DIAGNOSIS — N2581 Secondary hyperparathyroidism of renal origin: Secondary | ICD-10-CM

## 2014-04-19 DIAGNOSIS — G4700 Insomnia, unspecified: Secondary | ICD-10-CM

## 2014-04-19 DIAGNOSIS — Z992 Dependence on renal dialysis: Secondary | ICD-10-CM

## 2014-04-19 DIAGNOSIS — K219 Gastro-esophageal reflux disease without esophagitis: Secondary | ICD-10-CM

## 2014-04-19 DIAGNOSIS — F329 Major depressive disorder, single episode, unspecified: Secondary | ICD-10-CM

## 2014-04-19 DIAGNOSIS — D631 Anemia in chronic kidney disease: Secondary | ICD-10-CM

## 2014-04-19 DIAGNOSIS — F3289 Other specified depressive episodes: Secondary | ICD-10-CM

## 2014-04-19 DIAGNOSIS — I10 Essential (primary) hypertension: Secondary | ICD-10-CM

## 2014-04-19 DIAGNOSIS — N039 Chronic nephritic syndrome with unspecified morphologic changes: Secondary | ICD-10-CM

## 2014-04-19 DIAGNOSIS — I739 Peripheral vascular disease, unspecified: Secondary | ICD-10-CM

## 2014-04-19 DIAGNOSIS — K5900 Constipation, unspecified: Secondary | ICD-10-CM

## 2014-04-19 DIAGNOSIS — R5383 Other fatigue: Secondary | ICD-10-CM

## 2014-04-19 DIAGNOSIS — R5381 Other malaise: Secondary | ICD-10-CM

## 2014-04-19 DIAGNOSIS — N189 Chronic kidney disease, unspecified: Secondary | ICD-10-CM

## 2014-04-19 DIAGNOSIS — R531 Weakness: Secondary | ICD-10-CM

## 2014-04-19 NOTE — Progress Notes (Signed)
Patient ID: [name] [name], female   DOB: 1940/10/09, 73 y.o.   MRN: [id number]     Facility: [facility]    PCP: Cathlean Cower, MD   Allergies  Allergen Reactions  . Cephalosporins Itching and Rash    Vanc and fortaz given at the same time in June for several doses at dialysis with systemic rash and itching; received zinacef 7/5 and had worseningsystemicrash/ itching and swelling of eyes - so unclear if allergic to either or both  . Nsaids     REACTION: renal dysfunction  . Pioglitazone     Actos REACTION: EDEMA  . Vancomycin     See comment under cephalosporin    Chief Complaint: new admission  HPI:  73 y/o female patient is here for STR after hospital admission from 03/30/14- 04/05/14 with AMS. She had missed a couple of days of dialysis and had elevated creatinine and hyperkalemia. Nephrology was consulted and she was started on dialysis. Her uremia was causing her to be nauseous and she received rx for hyperkalemia. Her encephalopathy resolved and she returned to her baseline. With her generalized weakness, STR was recommended She has history of ESRD on dialysis 3 days a week, seizure disorder, dm, HTN, hypothyroidism and anemia.  She is seen in her room today. She complaints of feeling low with her family problems. She also has been constipated. Pain under control with current regimen. As per staff, she was seen by psychiatry recently and cymbalta dosing was increased to 60 mg. She mentions that her mood has been slightly better since then. She complaints of occassional scratchy feeling in her throat and occassional cough and mentions that mucinex normally helps and thus would like that. She also is having trouble sleeping  Review of Systems:  Constitutional: Negative for fever, chills, diaphoresis. feels tired HENT: Negative for congestion, hearing loss and sore throat.   Eyes: Negative for eye pain, blurred vision, double vision and discharge.  Respiratory:  Negative for sputum production, shortness of breath and wheezing.   Cardiovascular: Negative for chest pain, palpitations.  Gastrointestinal: Negative for heartburn, nausea, vomiting, abdominal pain, diarrhea. Has constipation.  Genitourinary: Negative for dysuria.  Musculoskeletal: Negative for falls.has chronic pain.  Skin: Negative for itching and rash.  Neurological: Negative for dizziness, tingling, focal weakness and headaches. has generalized weakness Psychiatric/Behavioral: has anxiety and feels depressed with her losing her mother and brother being diagnosed with lung cancer with less than 6 weeks of life predicted by the oncologist.     Past Medical History  Diagnosis Date  . HYPOTHYROIDISM 02/17/2007    s/p surgical removal of goiter in 1997  . DIABETES MELLITUS, TYPE II 02/17/2007  . HYPERLIPIDEMIA 02/17/2007  . GOUT 05/29/2007  . ANEMIA-NOS 05/29/2007  . ANXIETY 03/23/2010  . 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 DEPRESSION 02/17/2007  . RESTLESS LEG SYNDROME 05/29/2007  . COMMON MIGRAINE 05/29/2007  . PERIPHERAL NEUROPATHY 05/29/2007  . HYPERTENSION 02/17/2007  . PERIPHERAL VASCULAR DISEASE 02/17/2007  . PEPTIC ULCER DISEASE 05/29/2007  . ESRD on hemodialysis 02/17/2007    Started dialysis April 2014.  Gets HD at [facility] on TTS schedule.  Cause of ESRD was HTN.     . OSTEOPENIA 09/22/2009  . SEIZURE DISORDER 02/17/2007  . Memory loss 01/24/2010  . Personal history of colonic polyps 11/16/2009  . Palpitations 09/08/2010  . Other diseases of vocal cords 1996  . HEART MURMUR, HX OF 10/02/2010  . 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 Chronic neck pain  12/13/2010  . GERD (gastroesophageal reflux disease)   . 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 Cancer of kidney 10/07/2012    Followed per Dr Despina Pole, MD, urology, [place]   . Arthritis   . Critical lower limb ischemia    Past Surgical History  Procedure Laterality Date  . Left toe  amputated  2006  . Bunionectomy  1980  . Goiter removal  1997  . Stress cardiolite  06/18/2006  . Tranthoracic echocardiogram  06/18/2006  . Electrocardiogram  05/29/2006  . Cholecystectomy    . Rotator cuff repair  Left    Dr. Sharol Given  . Av fistula placement  03/13/2012    Procedure: ARTERIOVENOUS (AV) FISTULA CREATION;  Surgeon: Conrad Kent, MD;  Location: [place];  Service: Vascular;  Laterality: Right;  . Eye surgery      bilateral cataract removal  . Esophagoscopy w/ botox injection  07/22/2012    Procedure: ESOPHAGOSCOPY WITH BOTOX INJECTION;  Surgeon: Rozetta Nunnery, MD;  Location: [place];  Service: ENT;  Laterality: N/A;  esophageal dilation  . Insertion of dialysis catheter N/A 02/05/2013    Procedure: INSERTION OF DIALYSIS CATHETER;  Surgeon: Angelia Mould, MD;  Location: [place];  Service: Vascular;  Laterality: N/A;  Ultrasound guided   Social History:   reports that she has quit smoking. Her smoking use included Cigarettes. She has a 11.75 pack-year smoking history. She has never used smokeless tobacco. She reports that she does not drink alcohol or use illicit drugs.  Family History  Problem Relation Age of Onset  . Dementia Mother   . Hypertension Mother   . Coronary artery disease Other   . Hyperlipidemia Other   . Hypertension Other   . Ovarian cancer Other   . Stroke Other   . Hypertension Sister   . Hypertension Brother   . Heart attack Brother   . Stroke Brother     Medications: Patient's Medications  New Prescriptions   No medications on file  Previous Medications   ACETAMINOPHEN (TYLENOL) 325 MG TABLET    Take 2 tablets (650 mg total) by mouth every 6 (six) hours as needed for mild pain (or Fever >/= 101).   ALBUTEROL (PROVENTIL HFA;VENTOLIN HFA) 108 (90 BASE) MCG/ACT INHALER    Inhale 2 puffs into the lungs every 6 (six) hours as needed for wheezing or shortness of breath.   ALLOPURINOL (ZYLOPRIM) 100 MG TABLET    Take 100 mg by  mouth daily.    AMLODIPINE (NORVASC) 5 MG TABLET    Take 1 tablet (5 mg total) by mouth daily.   ASPIRIN EC 81 MG TABLET    Take 81 mg by mouth daily.   ATORVASTATIN (LIPITOR) 40 MG TABLET    Take 1 tablet (40 mg total) by mouth daily.   CALCIUM ACETATE (PHOSLO) 667 MG CAPSULE    Take 2 capsules (1,334 mg total) by mouth 3 (three) times daily with meals.   CINACALCET (SENSIPAR) 30 MG TABLET    Take 1 tablet (30 mg total) by mouth daily.   CLONAZEPAM (KLONOPIN) 2 MG TABLET    Take 1 tablet (2 mg total) by mouth 2 (two) times daily as needed for anxiety.   CYCLOBENZAPRINE (FLEXERIL) 10 MG TABLET    Take 10 mg by mouth 3 (three) times daily as needed for muscle spasms.    DOCUSATE SODIUM (COLACE) 100 MG CAPSULE  Take 100 mg by mouth 2 (two) times daily as needed for mild constipation.    DULOXETINE (CYMBALTA) 30 MG CAPSULE    Take 1 capsule (30 mg total) by mouth daily.   HYDROCODONE-ACETAMINOPHEN (NORCO/VICODIN) 5-325 MG PER TABLET    Take 1 tablet by mouth every 6 (six) hours as needed for severe pain.   HYDROXYZINE (ATARAX/VISTARIL) 25 MG TABLET    Take 25 mg by mouth every 6 (six) hours as needed for anxiety.   LACTULOSE (CHRONULAC) 10 GM/15ML SOLUTION    Take 20 g by mouth daily.   LEVOTHYROXINE (SYNTHROID, LEVOTHROID) 125 MCG TABLET    Take 125 mcg by mouth daily before breakfast.   MOMETASONE-FORMOTEROL (DULERA) 100-5 MCG/ACT AERO    Inhale 2 puffs into the lungs 2 (two) times daily.   MONTELUKAST (SINGULAIR) 10 MG TABLET    Take 10 mg by mouth at bedtime.   MULTIVITAMIN (RENA-VIT) TABS TABLET    Take 1 tablet by mouth daily.   OMEPRAZOLE (PRILOSEC) 40 MG CAPSULE    Take 40 mg by mouth daily.    PROMETHAZINE (PHENERGAN) 25 MG TABLET    Take 1 tablet (25 mg total) by mouth every 6 (six) hours as needed for nausea.   QUETIAPINE (SEROQUEL) 100 MG TABLET    Take 1 tablet (100 mg total) by mouth at bedtime.  Modified Medications   No medications on file  Discontinued Medications   No  medications on file     Physical Exam: Filed Vitals:   04/19/14 1344  BP: 132/72  Pulse: 84  Temp: 98 F (36.7 C)  Resp: 18  SpO2: 96%    General- elderly female in no acute distress Head- atraumatic, normocephalic Eyes- PERRLA, EOMI, no pallor, no icterus, no discharge Neck- no lymphadenopathy, no thyromegaly Throat- moist mucus membrane, normal oropharynx Cardiovascular- normal s1,s2, no murmurs Respiratory- bilateral clear to auscultation, no wheeze, no rhonchi, no crackles, no use of accessory muscles Abdomen- bowel sounds present, soft, non tender Musculoskeletal- able to move all 4 extremities, no leg edema, generalized weakness Neurological- no focal deficit Skin- warm and dry, right arm fistula with good thrill Psychiatry- alert and oriented, some anxiety noted    Labs reviewed: Basic Metabolic Panel:  Recent Labs  03/31/14 0900 04/01/14 0726 04/03/14 1204  NA 138 140 135*  K 6.9* 4.6 4.8  CL 95* 99 96  CO2 25 29 27   GLUCOSE 115* 105* 123*  BUN 58* 22 18  CREATININE 13.21* 6.77* 6.26*  CALCIUM 8.6 8.3* 8.5  PHOS 6.0* 4.2 3.8   Liver Function Tests:  Recent Labs  10/14/13 1525 03/30/14 1939 04/01/14 0726 04/03/14 1204  AST 18 28  --   --   ALT 17 <5  --   --   ALKPHOS 71 111  --   --   BILITOT 1.2 0.3  --   --   PROT 7.6 7.6  --   --   ALBUMIN 4.2 3.4* 2.4* 2.7*   No results found for this basename: LIPASE, AMYLASE,  in the last 8760 hours No results found for this basename: AMMONIA,  in the last 8760 hours CBC:  Recent Labs  10/14/13 1525  04/01/14 0726 04/02/14 0555 04/03/14 1204  WBC 7.6  < > 5.5 6.1 7.7  NEUTROABS 4.4  --   --   --   --   HGB 12.4  < > 9.1* 10.1* 10.3*  HCT 38.0  < > 27.6* 30.6* 30.4*  MCV 99.4  < >  92.3 92.4 88.6  PLT 342.0  < > 224 208 242  < > = values in this interval not displayed. Cardiac Enzymes: No results found for this basename: CKTOTAL, CKMB, CKMBINDEX, TROPONINI,  in the last 8760 hours BNP: No  components found with this basename: POCBNP,  CBG:  Recent Labs  04/05/14 0614 04/05/14 1107 04/05/14 1610  GLUCAP 99 116* 100*    Radiological Exams: Dg Chest Portable 1 View  03/30/2014   CLINICAL DATA:  Shortness of breath.  Dialysis patient.  EXAM: PORTABLE CHEST - 1 VIEW  COMPARISON:  02/05/2013.  FINDINGS: The right jugular catheter has been removed. Surgical clips are noted at the thoracic inlet and in the lower neck bilaterally. Decreased inspiration with bibasilar atelectasis. Borderline enlarged cardiac silhouette. Stable mild prominence of the interstitial markings with mild diffuse peribronchial thickening. Extensive left shoulder degenerative changes are again demonstrated.  IMPRESSION: 1. Poor inspiration with bibasilar atelectasis. 2. Stable chronic bronchitic changes.   Electronically Signed   By: Enrique Sack M.D.   On: 03/30/2014 21:50   EKG Normal sinus rhythm Left ventricular hypertrophy Inferior infarct , age undetermined  Assessment/Plan  Generalized weakness Will have her work with physical therapy and occupational therapy team to help with gait training and muscle strengthening exercises.fall precautions. Skin care. Encourage to be out of bed.   Depression and anxiety Continue cymbalta 60 mg daily, tolerating this well. Also on klonopin and seroquel.  Insomnia Add melatonin 3 mg qhs prn and reassess  ESRD  on hemodialysis 3 days a week. Continue phoslo and fluid restriction.   PVD S/p right second toe amputation. Pain under control. Continue her prn vicodin and flexeril, monitor clinically  gerd Continue prilosec 40 mg daily  Hyperparathyroidism Continue sensipar   Hypertension Stable, continue norvasc 5 mg daily with her statin and aspirin  Constipation Continue lactulose 20 gm daily with her prn colace and add miralax daily  Cough Will add mucinex 5 cc q8h prn for now  Anemia of ckd Monitor h&h  Family/ staff Communication: reviewed care  plan with patient and nursing supervisor   Goals of care: short term rehabilitation   Labs/tests ordered: cbc, cmp next lab    Blanchie Serve, MD  [facility] [phone] (Monday-Friday 8 am - 5 pm) [phone] (afterhours)

## 2014-04-20 ENCOUNTER — Other Ambulatory Visit: Payer: Self-pay | Admitting: *Deleted

## 2014-04-20 MED ORDER — HYDROCODONE-ACETAMINOPHEN 5-325 MG PO TABS: 1.0000 | ORAL_TABLET | Freq: Four times a day (QID) | ORAL | Status: DC | PRN

## 2014-04-20 MED ORDER — CLONAZEPAM 2 MG PO TABS: ORAL_TABLET | ORAL | Status: DC

## 2014-04-20 NOTE — Telephone Encounter (Signed)
[facility] 

## 2014-04-21 ENCOUNTER — Telehealth: Payer: Self-pay | Admitting: *Deleted

## 2014-04-21 NOTE — Telephone Encounter (Signed)
I have an appointment scheduled on 04/28/2014 at 1:30pm but beneath that, well I have one with Dr. Jenny Reichmann on the 18th.  Beneath Dr. Gwynn Burly it says as directed imaging dg foot complete right.  Is that some imaging I'm supposed to go somewhere and have done or is it what has been done?  I don't have my phone with me. I left it on the bus. You can call the scheduler at [facility] [place].  Her number is [phone].  She can relay the message to me until I get my phone back.  Thank you and have a great day.

## 2014-04-23 ENCOUNTER — Telehealth: Payer: Self-pay | Admitting: *Deleted

## 2014-04-23 ENCOUNTER — Ambulatory Visit: Payer: Medicare Other | Admitting: Internal Medicine

## 2014-04-23 NOTE — Telephone Encounter (Signed)
I attempted to call the patient back, she did not answer and her voicemail was full.  I left a message with Cristela Felt to tell patient that the dg imaging is for [place] about x-rays, nothing for her to worry about.  She said she will let her know.

## 2014-04-23 NOTE — Telephone Encounter (Signed)
Someone called me I'm just returning her call.  I had a second toe amputated.  I just got in from physical therapy.  Please give me a call back thank you.  I attempted to call her back, could leave a message because mailbox was full.

## 2014-04-27 ENCOUNTER — Encounter: Payer: Self-pay | Admitting: Podiatrist

## 2014-04-28 ENCOUNTER — Encounter: Payer: Medicare Other | Admitting: Podiatrist

## 2014-04-29 ENCOUNTER — Encounter: Payer: Self-pay | Admitting: Family

## 2014-04-30 ENCOUNTER — Ambulatory Visit (INDEPENDENT_AMBULATORY_CARE_PROVIDER_SITE_OTHER): Payer: Medicare Other | Admitting: Family

## 2014-04-30 ENCOUNTER — Encounter: Payer: Self-pay | Admitting: Family

## 2014-04-30 VITALS — BP 141/81 | HR 93 | Resp 16 | Ht 67.5 in | Wt 154.0 lb

## 2014-04-30 DIAGNOSIS — L988 Other specified disorders of the skin and subcutaneous tissue: Secondary | ICD-10-CM

## 2014-04-30 DIAGNOSIS — M79621 Pain in right upper arm: Secondary | ICD-10-CM | POA: Insufficient documentation

## 2014-04-30 DIAGNOSIS — T82898A Other specified complication of vascular prosthetic devices, implants and grafts, initial encounter: Secondary | ICD-10-CM

## 2014-04-30 DIAGNOSIS — R234 Changes in skin texture: Secondary | ICD-10-CM

## 2014-04-30 DIAGNOSIS — M79609 Pain in unspecified limb: Secondary | ICD-10-CM

## 2014-04-30 NOTE — Progress Notes (Signed)
Established Dialysis Access  History of Present Illness  [name] [name] is a 73 y.o. ([birth year]) female patient of Dr. Scot Dock who is s/p  basilic vein transposition placed in the right arm. The second stage was performed on 04/23/2012. The fistula had been working well. Saw her in consultation on 7-14 she had a rash over her entire right upper arm. I do not think it would be wise to cannulate the fistula with a rash and therefore I placed a catheter on 02/05/2013. She comes in for a follow up visit. Rash has completely resolved. She continues to have some problems with itching after dialysis. She is scheduled to see a dermatologist. She returns today, referred by Dr. Marval Regal, with C/O right AVF site bleeding, and painful, duration 2 wks. HD: Tue-Thur-Sat. Patient's complaint is that the right upper arm AVF site is "raw", rash is returning, she sees a dermatologist on Monday.  Her temporary right chest catheter was removed March, 2015. She uses lidocaine cream prior to HD  Accessing. Patient denies tingling, numbness, or pain in either hand or arm.   Past Medical History  Diagnosis Date  . HYPOTHYROIDISM 02/17/2007    s/p surgical removal of goiter in 1997  . DIABETES MELLITUS, TYPE II 02/17/2007  . HYPERLIPIDEMIA 02/17/2007  . GOUT 05/29/2007  . ANEMIA-NOS 05/29/2007  . ANXIETY 03/23/2010  . 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 DEPRESSION 02/17/2007  . RESTLESS LEG SYNDROME 05/29/2007  . COMMON MIGRAINE 05/29/2007  . PERIPHERAL NEUROPATHY 05/29/2007  . HYPERTENSION 02/17/2007  . PERIPHERAL VASCULAR DISEASE 02/17/2007  . PEPTIC ULCER DISEASE 05/29/2007  . ESRD on hemodialysis 02/17/2007    Started dialysis April 2014.  Gets HD at [facility] on TTS schedule.  Cause of ESRD was HTN.     . OSTEOPENIA 09/22/2009  . SEIZURE DISORDER 02/17/2007  . Memory loss 01/24/2010  . Personal history of colonic polyps 11/16/2009  . Palpitations 09/08/2010  . Other diseases of vocal cords 1996  . HEART MURMUR,  HX OF 10/02/2010  . 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 Chronic neck pain 12/13/2010  . GERD (gastroesophageal reflux disease)   . 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 Cancer of kidney 10/07/2012    Followed per Dr Despina Pole, MD, urology, [place]   . Arthritis   . Critical lower limb ischemia     Social History History  Substance Use Topics  . Smoking status: Former Smoker -- 0.25 packs/day for 47 years    Types: Cigarettes  . Smokeless tobacco: Never Used     Comment: USING E CIG  . Alcohol Use: No    Family History Family History  Problem Relation Age of Onset  . Dementia Mother   . Hypertension Mother   . Coronary artery disease Other   . Hyperlipidemia Other   . Hypertension Other   . Ovarian cancer Other   . Stroke Other   . Hypertension Sister   . Hypertension Brother   . Heart attack Brother   . Stroke Brother     Surgical History Past Surgical History  Procedure Laterality Date  . Left toe amputated  2006  . Bunionectomy  1980  . Goiter removal  1997  . Stress cardiolite  06/18/2006  . Tranthoracic echocardiogram  06/18/2006  . Electrocardiogram  05/29/2006  . Cholecystectomy    . Rotator cuff repair  Left    Dr. Sharol Given  . Av fistula placement  03/13/2012    Procedure: ARTERIOVENOUS (AV) FISTULA CREATION;  Surgeon: Conrad Spokane, MD;  Location: [place];  Service: Vascular;  Laterality: Right;  . Eye surgery      bilateral cataract removal  . Esophagoscopy w/ botox injection  07/22/2012    Procedure: ESOPHAGOSCOPY WITH BOTOX INJECTION;  Surgeon: Rozetta Nunnery, MD;  Location: [place];  Service: ENT;  Laterality: N/A;  esophageal dilation  . Insertion of dialysis catheter N/A 02/05/2013    Procedure: INSERTION OF DIALYSIS CATHETER;  Surgeon: Angelia Mould, MD;  Location: [facility];  Service: Vascular;  Laterality: N/A;  Ultrasound guided    Allergies  Allergen Reactions  .  Cephalosporins Itching and Rash    Vanc and fortaz given at the same time in June for several doses at dialysis with systemic rash and itching; received zinacef 7/5 and had worseningsystemicrash/ itching and swelling of eyes - so unclear if allergic to either or both  . Nsaids     REACTION: renal dysfunction  . Pioglitazone     Actos REACTION: EDEMA  . Vancomycin     See comment under cephalosporin    Current Outpatient Prescriptions  Medication Sig Dispense Refill  . acetaminophen (TYLENOL) 325 MG tablet Take 2 tablets (650 mg total) by mouth every 6 (six) hours as needed for mild pain (or Fever >/= 101).      Marland Kitchen albuterol (PROVENTIL HFA;VENTOLIN HFA) 108 (90 BASE) MCG/ACT inhaler Inhale 2 puffs into the lungs every 6 (six) hours as needed for wheezing or shortness of breath.  1 Inhaler  11  . allopurinol (ZYLOPRIM) 100 MG tablet Take 100 mg by mouth daily.       Marland Kitchen amLODipine (NORVASC) 5 MG tablet Take 1 tablet (5 mg total) by mouth daily.  90 tablet  3  . aspirin EC 81 MG tablet Take 81 mg by mouth daily.      Marland Kitchen atorvastatin (LIPITOR) 40 MG tablet Take 1 tablet (40 mg total) by mouth daily.  30 tablet  11  . calcium acetate (PHOSLO) 667 MG capsule Take 2 capsules (1,334 mg total) by mouth 3 (three) times daily with meals.      . cinacalcet (SENSIPAR) 30 MG tablet Take 1 tablet (30 mg total) by mouth daily.  60 tablet  11  . clonazePAM (KLONOPIN) 2 MG tablet Take one tablet by mouth twice daily as needed for anxiety  60 tablet  5  . cyclobenzaprine (FLEXERIL) 10 MG tablet Take 10 mg by mouth 3 (three) times daily as needed for muscle spasms.       Marland Kitchen docusate sodium (COLACE) 100 MG capsule Take 100 mg by mouth 2 (two) times daily as needed for mild constipation.       . DULoxetine (CYMBALTA) 30 MG capsule Take 1 capsule (30 mg total) by mouth daily.  90 capsule  3  . HYDROcodone-acetaminophen (NORCO/VICODIN) 5-325 MG per tablet Take 1 tablet by mouth every 6 (six) hours as needed for severe  pain.  120 tablet  0  . hydrOXYzine (ATARAX/VISTARIL) 25 MG tablet Take 25 mg by mouth every 6 (six) hours as needed for anxiety.      Marland Kitchen lactulose (CHRONULAC) 10 GM/15ML solution Take 20 g by mouth daily.      Marland Kitchen levothyroxine (SYNTHROID, LEVOTHROID) 125 MCG tablet Take 125 mcg by mouth daily before breakfast.      . mometasone-formoterol (DULERA) 100-5 MCG/ACT AERO Inhale 2 puffs into the lungs 2 (two) times  daily.  1 Inhaler  6  . montelukast (SINGULAIR) 10 MG tablet Take 10 mg by mouth at bedtime.      . multivitamin (RENA-VIT) TABS tablet Take 1 tablet by mouth daily.      Marland Kitchen omeprazole (PRILOSEC) 40 MG capsule Take 40 mg by mouth daily.       . QUEtiapine (SEROQUEL) 100 MG tablet Take 1 tablet (100 mg total) by mouth at bedtime.  30 tablet  0  . promethazine (PHENERGAN) 25 MG tablet Take 1 tablet (25 mg total) by mouth every 6 (six) hours as needed for nausea.  30 tablet  2   No current facility-administered medications for this visit.     REVIEW OF SYSTEMS: see HPI for pertinent positives and negatives    PHYSICAL EXAMINATION:  Filed Vitals:   04/30/14 1509  BP: 141/81  Pulse: 93  Resp: 16  Height: 5' 7.5" (1.715 m)  Weight: 154 lb (69.854 kg)  SpO2: 100%   Body mass index is 23.75 kg/(m^2).  General: The patient appears their stated age.   HEENT:  No gross abnormalities Pulmonary: Respirations are non-labored Abdomen: Soft and non-tender. Musculoskeletal: There are no major deformities.   Neurologic: No focal weakness or paresthesias are detected, Skin: There are no ulcers noted;  pink irritated areas with dry scaly skin at right upper arm AVF. No signs of infection. Psychiatric: The patient has normal affect. Cardiovascular: There is a regular rate and rhythm. Bilateral radial pulses are 2+ palpable and =. Palpable thrill right upper arm AVF.    Medical Decision Making  Aneisa L Lawrence is a 73 y.o. female who presents with returning irritated rash on the skin of right  upper arm AVF. Dr. Scot Dock examined and spoke with pt. Avoid HD access at red raw areas of right UE AVF, defer to dermatologist advice and treatment of raw irritated skin over AVF, she has an appointment on Monday (3 days). Follow up as needed.  NICKEL, Sharmon Leyden, RN, MSN, FNP-C [facility] Office: [phone]  04/30/2014, 3:13 PM  Clinic MD: Bridgett Larsson

## 2014-05-04 ENCOUNTER — Non-Acute Institutional Stay (SKILLED_NURSING_FACILITY): Payer: Medicare Other | Admitting: Adult Health

## 2014-05-04 DIAGNOSIS — I739 Peripheral vascular disease, unspecified: Secondary | ICD-10-CM

## 2014-05-04 DIAGNOSIS — I150 Renovascular hypertension: Secondary | ICD-10-CM

## 2014-05-04 DIAGNOSIS — Z992 Dependence on renal dialysis: Secondary | ICD-10-CM

## 2014-05-04 DIAGNOSIS — N186 End stage renal disease: Secondary | ICD-10-CM

## 2014-05-04 NOTE — Progress Notes (Signed)
Patient ID: [name] [name], female   DOB: 1940/09/23, 73 y.o.   MRN: [id number]     [facility]  Allergies  Allergen Reactions  . Cephalosporins Itching and Rash    Vanc and fortaz given at the same time in June for several doses at dialysis with systemic rash and itching; received zinacef 7/5 and had worseningsystemicrash/ itching and swelling of eyes - so unclear if allergic to either or both  . Nsaids     REACTION: renal dysfunction  . Pioglitazone     Actos REACTION: EDEMA  . Vancomycin     See comment under cephalosporin     Chief Complaint  Patient presents with  . Discharge Note    HPI:  She is being discharged to home with home health for pt/ot/nursing/aid/sw. She will need a wheelchair in order to maintain her current level of independence which cannot be achieved with a walker. Marland Kitchen She will need her prescriptions to be written and will need a follow up with her pcp.  She had been hospitalized after missing some dialysis treatments.    Past Medical History  Diagnosis Date  . HYPOTHYROIDISM 02/17/2007    s/p surgical removal of goiter in 1997  . DIABETES MELLITUS, TYPE II 02/17/2007  . HYPERLIPIDEMIA 02/17/2007  . GOUT 05/29/2007  . ANEMIA-NOS 05/29/2007  . ANXIETY 03/23/2010  . 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 DEPRESSION 02/17/2007  . RESTLESS LEG SYNDROME 05/29/2007  . COMMON MIGRAINE 05/29/2007  . PERIPHERAL NEUROPATHY 05/29/2007  . HYPERTENSION 02/17/2007  . PERIPHERAL VASCULAR DISEASE 02/17/2007  . PEPTIC ULCER DISEASE 05/29/2007  . ESRD on hemodialysis 02/17/2007    Started dialysis April 2014.  Gets HD at [facility] on TTS schedule.  Cause of ESRD was HTN.     . OSTEOPENIA 09/22/2009  . SEIZURE DISORDER 02/17/2007  . Memory loss 01/24/2010  . Personal history of colonic polyps 11/16/2009  . Palpitations 09/08/2010  . Other diseases of vocal cords 1996  . HEART MURMUR, HX OF 10/02/2010  . 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 Chronic neck pain 12/13/2010  . GERD (gastroesophageal  reflux disease)   . 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 Cancer of kidney 10/07/2012    Followed per Dr Despina Pole, MD, urology, [place]   . Arthritis   . Critical lower limb ischemia     Past Surgical History  Procedure Laterality Date  . Left toe amputated  2006  . Bunionectomy  1980  . Goiter removal  1997  . Stress cardiolite  06/18/2006  . Tranthoracic echocardiogram  06/18/2006  . Electrocardiogram  05/29/2006  . Cholecystectomy    . Rotator cuff repair  Left    Dr. Sharol Given  . Av fistula placement  03/13/2012    Procedure: ARTERIOVENOUS (AV) FISTULA CREATION;  Surgeon: Conrad [place], MD;  Location: [place];  Service: Vascular;  Laterality: Right;  . Eye surgery      bilateral cataract removal  . Esophagoscopy w/ botox injection  07/22/2012    Procedure: ESOPHAGOSCOPY WITH BOTOX INJECTION;  Surgeon: Rozetta Nunnery, MD;  Location: [place];  Service: ENT;  Laterality: N/A;  esophageal dilation  . Insertion of dialysis catheter N/A 02/05/2013    Procedure: INSERTION OF DIALYSIS CATHETER;  Surgeon: Angelia Mould, MD;  Location: [place];  Service: Vascular;  Laterality: N/A;  Ultrasound guided  . Toe amputation Right Aug. 2015    2nd  VITAL SIGNS BP 143/80  Pulse 70  Ht 5' 7.5" (1.715 m)  Wt 154 lb (69.854 kg)  BMI 23.75 kg/m2   Patient's Medications  New Prescriptions   No medications on file  Previous Medications   ACETAMINOPHEN (TYLENOL) 325 MG TABLET    Take 2 tablets (650 mg total) by mouth every 6 (six) hours as needed for mild pain (or Fever >/= 101).   ALBUTEROL (PROVENTIL HFA;VENTOLIN HFA) 108 (90 BASE) MCG/ACT INHALER    Inhale 2 puffs into the lungs every 6 (six) hours as needed for wheezing or shortness of breath.   ALLOPURINOL (ZYLOPRIM) 100 MG TABLET    Take 100 mg by mouth daily.    AMLODIPINE (NORVASC) 5 MG TABLET    Take 1 tablet (5 mg total) by mouth daily.   ASPIRIN EC 81  MG TABLET    Take 81 mg by mouth daily.   ATORVASTATIN (LIPITOR) 40 MG TABLET    Take 1 tablet (40 mg total) by mouth daily.   CINACALCET (SENSIPAR) 30 MG TABLET    Take 1 tablet (30 mg total) by mouth daily.   CLONAZEPAM (KLONOPIN) 2 MG TABLET    Take one tablet by mouth twice daily as needed for anxiety   COLCHICINE 0.6 MG TABLET    Take 0.6 mg by mouth daily as needed.   CYCLOBENZAPRINE (FLEXERIL) 10 MG TABLET    Take 10 mg by mouth 3 (three) times daily as needed for muscle spasms.    DARBEPOETIN (ARANESP) 25 MCG/0.42ML SOLN INJECTION    Inject 25 mcg into the vein every 7 (seven) days. Every Thursday at dialysis   DOCUSATE SODIUM (COLACE) 100 MG CAPSULE    Take 100 mg by mouth 2 (two) times daily as needed for mild constipation.    DOXERCALCIFEROL (HECTOROL) 4 MCG/2ML INJECTION    Inject 4 mcg into the vein Every Tuesday,Thursday,and Saturday with dialysis.   HYDROCODONE-ACETAMINOPHEN (NORCO/VICODIN) 5-325 MG PER TABLET    Take 1 tablet by mouth every 6 (six) hours as needed for severe pain.   HYDROXYZINE (ATARAX/VISTARIL) 25 MG TABLET    Take 25 mg by mouth every 6 (six) hours as needed for anxiety.   LACTULOSE (CHRONULAC) 10 GM/15ML SOLUTION    Take 20 g by mouth daily.   LEVOTHYROXINE (SYNTHROID, LEVOTHROID) 125 MCG TABLET    Take 125 mcg by mouth daily before breakfast.   MELATONIN 3 MG TABS    Take 3 mg by mouth at bedtime as needed.   MOMETASONE-FORMOTEROL (DULERA) 100-5 MCG/ACT AERO    Inhale 2 puffs into the lungs 2 (two) times daily.   MONTELUKAST (SINGULAIR) 10 MG TABLET    Take 10 mg by mouth at bedtime.   MULTIVITAMIN (RENA-VIT) TABS TABLET    Take 1 tablet by mouth daily.   OMEPRAZOLE (PRILOSEC) 40 MG CAPSULE    Take 40 mg by mouth daily.    ONDANSETRON (ZOFRAN) 4 MG TABLET    Take 4 mg by mouth every 6 (six) hours as needed for nausea or vomiting.   POLYETHYLENE GLYCOL (MIRALAX / GLYCOLAX) PACKET    Take 17 g by mouth daily.   QUETIAPINE (SEROQUEL) 100 MG TABLET    Take 1  tablet (100 mg total) by mouth at bedtime.   SEVELAMER CARBONATE (RENVELA) 800 MG TABLET    Take 2,400 mg by mouth 3 (three) times daily with meals.   TRIAMCINOLONE CREAM (KENALOG) 0.1 %    Apply 1 application topically 2 (two) times  daily. Apply over dialysis shun bid for 14 days.  Modified Medications   Modified Medication Previous Medication   DULOXETINE (CYMBALTA) 30 MG CAPSULE DULoxetine (CYMBALTA) 30 MG capsule      Take 60 mg by mouth daily.    Take 1 capsule (30 mg total) by mouth daily.  Discontinued Medications   CALCIUM ACETATE (PHOSLO) 667 MG CAPSULE    Take 2 capsules (1,334 mg total) by mouth 3 (three) times daily with meals.   PROMETHAZINE (PHENERGAN) 25 MG TABLET    Take 1 tablet (25 mg total) by mouth every 6 (six) hours as needed for nausea.    SIGNIFICANT DIAGNOSTIC EXAMS  03-30-14: chest x-ray: 1. Poor inspiration with bibasilar atelectasis.2. Stable chronic bronchitic changes.    LABS REVIEWED:   03-30-14: wbc 8.2; hgb 9.9; hct 29.4; mcv 89.6; plt 280; glucose 80; bun 58; creat 13.34; k+6.2; na++139; liver normal albumin 3.4; urine culture: 60,000 yeast 03-31-14: wbc 7.8; hgb 10.6; hct 32.1; mcv 91.5; lt 282; glucose 115; bun 58; creat 13.21; k+6.9; na++138; phos 6.0; hgb a1c 6.1 04-03-14: wbc 7.7; hgb 10.3; hct 30.4; mcv 88.6; plt 242; glucose 123; bun 18; creat 6.26; k+4.8; na++135     Review of Systems  Constitutional: Negative for malaise/fatigue.  Respiratory: Negative for cough and shortness of breath.   Cardiovascular: Negative for chest pain and palpitations.  Gastrointestinal: Negative for heartburn, abdominal pain and constipation.  Musculoskeletal:       Has right foot pain; current regimen effective   Skin:       Has had right second toe removed   Neurological: Negative for headaches.  Psychiatric/Behavioral: Negative for depression. The patient is not nervous/anxious.       Physical Exam  Constitutional: She is oriented to person, place, and  time. She appears well-developed and well-nourished. No distress.  Neck: Neck supple. No JVD present.  Cardiovascular: Normal rate and regular rhythm.   Pedal pulses are faint   Respiratory: Effort normal and breath sounds normal. No respiratory distress. She has no wheezes.  GI: Soft. Bowel sounds are normal. She exhibits no distension.  Musculoskeletal: She exhibits no edema.  Is able to move all extremities   Neurological: She is alert and oriented to person, place, and time.  Skin: Skin is warm and dry. She is not diaphoretic.  Right second toe without signs of infection present Right arm a/v fistula with +thrill+bruit   Psychiatric: She has a normal mood and affect.       ASSESSMENT/ PLAN:  Will discharge her to home with home health for pt/ot: to improve upon her strength; mobility and independence with adl's; rn/aid for disease/medication management/adl care; sw services. She will need a wheelchair. Her prescriptions have been written for a 30 day supply with klonopin 2 mg #30 tabs and vicodin 5/325 mg #30 tabs. She has a follow up appointment set up with: Dr. Cathlean Cower on 05-12-14 at 4 pm.       Time spent with patient 40 minutes.    Ok Edwards NP [facility]  Contact [phone] Monday through Friday 8am- 5pm  After hours call [phone]

## 2014-05-05 ENCOUNTER — Ambulatory Visit (INDEPENDENT_AMBULATORY_CARE_PROVIDER_SITE_OTHER): Payer: Medicare Other | Admitting: Podiatrist

## 2014-05-05 VITALS — BP 137/69 | HR 97 | Resp 16

## 2014-05-05 DIAGNOSIS — S98139A Complete traumatic amputation of one unspecified lesser toe, initial encounter: Secondary | ICD-10-CM

## 2014-05-05 DIAGNOSIS — Z89421 Acquired absence of other right toe(s): Secondary | ICD-10-CM

## 2014-05-05 NOTE — Progress Notes (Signed)
Subjective: Mrs. Murillo presents today for her second postoperative followup regarding amputation of the second toe right foot. Date of surgery was 03/25/2014. She has been in rehab and the wound care nurse has been changing her dressing daily. She denies any local or systemic signs of infection and states she has no pain in the toe.  She also states she is getting ready to leave rehab today.  She is doing well.  Objective: Vascular status continues to reveal palpable pedal pulses right foot. Incision site is well healed.  There is mild hyperkeratotic tissue overlying the incision site itself.  No redness, no swelling, no streaking, no lymphangitis is seen. Overall excellent appearance of the right foot is noted status post amputation   Assessment: Status post amputation second digit right foot date of surgery 03/25/2014   Plan: Discussed shoe gear changes and that she may wear whatever shoes are comfortable at this time-she has a good pair of new balance issues which I recommended. I also debrided her toenails today as a courtesy. She'll be seen back for a final followup in 6 weeks and if any problems or concerns arise prior that visit she will call. If any drainage, swelling or pain in arises she's going to call.

## 2014-05-09 ENCOUNTER — Other Ambulatory Visit: Payer: Self-pay | Admitting: Internal Medicine

## 2014-05-12 ENCOUNTER — Ambulatory Visit: Payer: Medicare Other | Admitting: Internal Medicine

## 2014-05-20 ENCOUNTER — Encounter (HOSPITAL_COMMUNITY): Payer: Self-pay | Admitting: Emergency Medicine

## 2014-05-20 ENCOUNTER — Ambulatory Visit (HOSPITAL_COMMUNITY): Payer: Medicare Other | Attending: Emergency Medicine

## 2014-05-20 ENCOUNTER — Emergency Department (INDEPENDENT_AMBULATORY_CARE_PROVIDER_SITE_OTHER)
Admission: EM | Admit: 2014-05-20 | Discharge: 2014-05-20 | Disposition: A | Discharge status: Home / Self Care | Payer: Medicare Other | Source: Home / Self Care | Attending: Family Medicine | Admitting: Family Medicine

## 2014-05-20 ENCOUNTER — Ambulatory Visit: Payer: Medicare Other | Admitting: Internal Medicine

## 2014-05-20 DIAGNOSIS — R05 Cough: Secondary | ICD-10-CM | POA: Insufficient documentation

## 2014-05-20 DIAGNOSIS — R4689 Other symptoms and signs involving appearance and behavior: Secondary | ICD-10-CM | POA: Insufficient documentation

## 2014-05-20 DIAGNOSIS — Z0289 Encounter for other administrative examinations: Secondary | ICD-10-CM

## 2014-05-20 DIAGNOSIS — J441 Chronic obstructive pulmonary disease with (acute) exacerbation: Secondary | ICD-10-CM

## 2014-05-20 MED ORDER — PREDNISONE 50 MG PO TABS: 50.0000 mg | ORAL_TABLET | Freq: Every day | ORAL | Status: DC

## 2014-05-20 MED ORDER — ALBUTEROL SULFATE HFA 108 (90 BASE) MCG/ACT IN AERS: 2.0000 | INHALATION_SPRAY | RESPIRATORY_TRACT | Status: DC | PRN

## 2014-05-20 MED ORDER — LEVOFLOXACIN 500 MG PO TABS: 500.0000 mg | ORAL_TABLET | Freq: Every day | ORAL | Status: DC

## 2014-05-20 MED ORDER — IPRATROPIUM BROMIDE 0.02 % IN SOLN
0.5000 mg | Freq: Once | RESPIRATORY_TRACT | Status: AC
  Administered 2014-05-20: 0.5 mg via RESPIRATORY_TRACT

## 2014-05-20 MED ORDER — PREDNISONE 20 MG PO TABS
ORAL_TABLET | ORAL | Status: AC
  Filled 2014-05-20: qty 3

## 2014-05-20 MED ORDER — ALBUTEROL SULFATE (2.5 MG/3ML) 0.083% IN NEBU
INHALATION_SOLUTION | RESPIRATORY_TRACT | Status: AC
  Filled 2014-05-20: qty 6

## 2014-05-20 MED ORDER — IPRATROPIUM BROMIDE 0.02 % IN SOLN
RESPIRATORY_TRACT | Status: AC
  Filled 2014-05-20: qty 2.5

## 2014-05-20 MED ORDER — PREDNISONE 20 MG PO TABS
60.0000 mg | ORAL_TABLET | Freq: Once | ORAL | Status: AC
  Administered 2014-05-20: 60 mg via ORAL

## 2014-05-20 MED ORDER — ALBUTEROL SULFATE (2.5 MG/3ML) 0.083% IN NEBU
5.0000 mg | INHALATION_SOLUTION | Freq: Once | RESPIRATORY_TRACT | Status: AC
  Administered 2014-05-20: 5 mg via RESPIRATORY_TRACT

## 2014-05-20 NOTE — ED Provider Notes (Signed)
CSN: [id number]     Arrival date & time 05/20/14  1412 History   First MD Initiated Contact with Patient 05/20/14 1448     Chief Complaint  Patient presents with  . Cough   (Consider location/radiation/quality/duration/timing/severity/associated sxs/prior Treatment) HPI    73 year old female with history of ESRD on dialysis, type 2 diabetes, COPD, current smoker, presents for evaluation of possible bronchitis and laryngitis. She has been sick for about 3 days. Presents as a been constant and worsening. She is cough, wheezing, hoarseness in her voice. She denies shortness of breath, no fever or vomiting. No recent travel or sick contacts. She does have a history of pneumonia.  Past Medical History  Diagnosis Date  . HYPOTHYROIDISM 02/17/2007    s/p surgical removal of goiter in 1997  . DIABETES MELLITUS, TYPE II 02/17/2007  . HYPERLIPIDEMIA 02/17/2007  . GOUT 05/29/2007  . ANEMIA-NOS 05/29/2007  . ANXIETY 03/23/2010  . 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 DEPRESSION 02/17/2007  . RESTLESS LEG SYNDROME 05/29/2007  . COMMON MIGRAINE 05/29/2007  . PERIPHERAL NEUROPATHY 05/29/2007  . HYPERTENSION 02/17/2007  . PERIPHERAL VASCULAR DISEASE 02/17/2007  . PEPTIC ULCER DISEASE 05/29/2007  . ESRD on hemodialysis 02/17/2007    Started dialysis April 2014.  Gets HD at [facility] on TTS schedule.  Cause of ESRD was HTN.     . OSTEOPENIA 09/22/2009  . SEIZURE DISORDER 02/17/2007  . Memory loss 01/24/2010  . Personal history of colonic polyps 11/16/2009  . Palpitations 09/08/2010  . Other diseases of vocal cords 1996  . HEART MURMUR, HX OF 10/02/2010  . 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 Chronic neck pain 12/13/2010  . GERD (gastroesophageal reflux disease)   . 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 Cancer of kidney 10/07/2012    Followed per Dr Despina Pole, MD, urology, [place]   . Arthritis   . Critical lower limb ischemia    Past Surgical History  Procedure  Laterality Date  . Left toe amputated  2006  . Bunionectomy  1980  . Goiter removal  1997  . Stress cardiolite  06/18/2006  . Tranthoracic echocardiogram  06/18/2006  . Electrocardiogram  05/29/2006  . Cholecystectomy    . Rotator cuff repair  Left    Dr. Sharol Given  . Av fistula placement  03/13/2012    Procedure: ARTERIOVENOUS (AV) FISTULA CREATION;  Surgeon: Conrad [place], MD;  Location: [place];  Service: Vascular;  Laterality: Right;  . Eye surgery      bilateral cataract removal  . Esophagoscopy w/ botox injection  07/22/2012    Procedure: ESOPHAGOSCOPY WITH BOTOX INJECTION;  Surgeon: Rozetta Nunnery, MD;  Location: [place];  Service: ENT;  Laterality: N/A;  esophageal dilation  . Insertion of dialysis catheter N/A 02/05/2013    Procedure: INSERTION OF DIALYSIS CATHETER;  Surgeon: Angelia Mould, MD;  Location: [place];  Service: Vascular;  Laterality: N/A;  Ultrasound guided  . Toe amputation Right Aug. 2015    2nd    Family History  Problem Relation Age of Onset  . Dementia Mother   . Hypertension Mother   . Coronary artery disease Other   . Hyperlipidemia Other   . Hypertension Other   . Ovarian cancer Other   . Stroke Other   . Hypertension Sister   . Hypertension Brother   . Heart attack Brother   . Stroke Brother    History  Substance Use  Topics  . Smoking status: Former Smoker -- 0.25 packs/day for 47 years    Types: Cigarettes  . Smokeless tobacco: Never Used     Comment: USING E CIG  . Alcohol Use: No   OB History   Grav Para Term Preterm Abortions TAB SAB Ect Mult Living                 Review of Systems  Constitutional: Positive for fatigue. Negative for fever and chills.  HENT: Positive for congestion and voice change.   Respiratory: Positive for cough, chest tightness and wheezing. Negative for shortness of breath.   All other systems reviewed and are negative.   Allergies  Cephalosporins; Nsaids; Pioglitazone; and  Vancomycin  Home Medications   Prior to Admission medications   Medication Sig Start Date End Date Taking? Authorizing Provider  acetaminophen (TYLENOL) 325 MG tablet Take 2 tablets (650 mg total) by mouth every 6 (six) hours as needed for mild pain (or Fever >/= 101). 04/05/14   Delfina Redwood, MD  albuterol (PROVENTIL HFA;VENTOLIN HFA) 108 (90 BASE) MCG/ACT inhaler Inhale 2 puffs into the lungs every 6 (six) hours as needed for wheezing or shortness of breath. 10/14/13   Biagio Borg, MD  albuterol (PROVENTIL HFA;VENTOLIN HFA) 108 (90 BASE) MCG/ACT inhaler Inhale 2 puffs into the lungs every 4 (four) hours as needed for wheezing. 05/20/14   Liam Graham, PA-C  allopurinol (ZYLOPRIM) 100 MG tablet Take 100 mg by mouth daily.  01/04/12   Historical Provider, MD  amLODipine (NORVASC) 5 MG tablet Take 1 tablet (5 mg total) by mouth daily. 04/12/14   Gerlene Fee, NP  aspirin EC 81 MG tablet Take 81 mg by mouth daily.    Historical Provider, MD  atorvastatin (LIPITOR) 40 MG tablet TAKE ONE TABLET BY MOUTH ONCE DAILY 05/10/14   Hendricks Limes, MD  cinacalcet (SENSIPAR) 30 MG tablet Take 1 tablet (30 mg total) by mouth daily. 04/12/14   Gerlene Fee, NP  clonazePAM Bobbye [place]) 2 MG tablet Take one tablet by mouth twice daily as needed for anxiety 04/20/14   Estill Dooms, MD  colchicine 0.6 MG tablet Take 0.6 mg by mouth daily as needed.    Historical Provider, MD  cyclobenzaprine (FLEXERIL) 10 MG tablet Take 10 mg by mouth 3 (three) times daily as needed for muscle spasms.     Historical Provider, MD  darbepoetin (ARANESP) 25 MCG/0.42ML SOLN injection Inject 25 mcg into the vein every 7 (seven) days. Every Thursday at dialysis    Historical Provider, MD  docusate sodium (COLACE) 100 MG capsule Take 100 mg by mouth 2 (two) times daily as needed for mild constipation.     Historical Provider, MD  doxercalciferol (HECTOROL) 4 MCG/2ML injection Inject 4 mcg into the vein Every Tuesday,Thursday,and  Saturday with dialysis.    Historical Provider, MD  DULoxetine (CYMBALTA) 30 MG capsule Take 60 mg by mouth daily. 12/09/13   Biagio Borg, MD  HYDROcodone-acetaminophen (NORCO/VICODIN) 5-325 MG per tablet Take 1 tablet by mouth every 6 (six) hours as needed for severe pain. 04/20/14   Estill Dooms, MD  hydrOXYzine (ATARAX/VISTARIL) 25 MG tablet Take 25 mg by mouth every 6 (six) hours as needed for anxiety.    Historical Provider, MD  lactulose (CHRONULAC) 10 GM/15ML solution Take 20 g by mouth daily.    Historical Provider, MD  levofloxacin (LEVAQUIN) 500 MG tablet Take 1 tablet (500 mg total) by mouth daily. 05/20/14  Freeman Caldron Kjersti Dittmer, PA-C  levothyroxine (SYNTHROID, LEVOTHROID) 125 MCG tablet Take 125 mcg by mouth daily before breakfast.    Historical Provider, MD  Melatonin 3 MG TABS Take 3 mg by mouth at bedtime as needed.    Historical Provider, MD  mometasone-formoterol (DULERA) 100-5 MCG/ACT AERO Inhale 2 puffs into the lungs 2 (two) times daily. 03/24/14   Biagio Borg, MD  montelukast (SINGULAIR) 10 MG tablet Take 10 mg by mouth at bedtime.    Historical Provider, MD  multivitamin (RENA-VIT) TABS tablet Take 1 tablet by mouth daily.    Historical Provider, MD  omeprazole (PRILOSEC) 40 MG capsule Take 40 mg by mouth daily.  02/15/12   Historical Provider, MD  ondansetron (ZOFRAN) 4 MG tablet Take 4 mg by mouth every 6 (six) hours as needed for nausea or vomiting.    Historical Provider, MD  polyethylene glycol (MIRALAX / GLYCOLAX) packet Take 17 g by mouth daily.    Historical Provider, MD  predniSONE (DELTASONE) 50 MG tablet Take 1 tablet (50 mg total) by mouth daily with breakfast. 05/20/14   Liam Graham, PA-C  QUEtiapine (SEROQUEL) 100 MG tablet Take 1 tablet (100 mg total) by mouth at bedtime. 04/05/14   Delfina Redwood, MD  sevelamer carbonate (RENVELA) 800 MG tablet Take 2,400 mg by mouth 3 (three) times daily with meals.    Historical Provider, MD   BP 130/72  Pulse 98   Temp(Src) 98.9 F (37.2 C) (Oral)  Resp 16 Physical Exam  Nursing note and vitals reviewed. Constitutional: She is oriented to person, place, and time. Vital signs are normal. She appears well-developed and well-nourished. No distress.  HENT:  Head: Normocephalic and atraumatic.  Eyes: Conjunctivae are normal. Right eye exhibits no discharge. Left eye exhibits no discharge.  Neck: Normal range of motion. Neck supple.  Cardiovascular: Normal rate, regular rhythm, normal heart sounds and intact distal pulses.  Exam reveals no gallop and no friction rub.   No murmur heard. Pulmonary/Chest: Effort normal. No respiratory distress. She has wheezes (diffuse, expiratory). She has no rhonchi. She has no rales.  pursed lip breathing  Neurological: She is alert and oriented to person, place, and time. She has normal strength. Coordination normal.  Skin: Skin is warm and dry. No rash noted. She is not diaphoretic.  Psychiatric: She has a normal mood and affect. Judgment normal.    ED Course  Procedures (including critical care time) Labs Review Labs Reviewed - No data to display  Imaging Review Dg Chest 2 View  05/20/2014   CLINICAL DATA:  COUGH  EXAM: CHEST  2 VIEW  COMPARISON:  03/30/2014  FINDINGS: There is left basilar atelectasis versus scarring. There is no focal parenchymal opacity, pleural effusion, or pneumothorax. The heart and mediastinal contours are unremarkable.  The osseous structures are unremarkable.  IMPRESSION: No active cardiopulmonary disease.   Electronically Signed   By: Kathreen Devoid   On: 05/20/2014 15:55     MDM   1. COPD exacerbation    Complete resolution of wheezing with one breathing treatment. No signs of pneumonia on x-ray. Treat with Levaquin, prednisone, albuterol. ER if worsening  Meds ordered this encounter  Medications  . predniSONE (DELTASONE) tablet 60 mg    Sig:   . albuterol (PROVENTIL) (2.5 MG/3ML) 0.083% nebulizer solution 5 mg    Sig:   .  ipratropium (ATROVENT) nebulizer solution 0.5 mg    Sig:   . predniSONE (DELTASONE) 50 MG tablet  Sig: Take 1 tablet (50 mg total) by mouth daily with breakfast.    Dispense:  4 tablet    Refill:  0    Order Specific Question:  Supervising Provider    Answer:  Jake Michaelis, DAVID C [id number]  . albuterol (PROVENTIL HFA;VENTOLIN HFA) 108 (90 BASE) MCG/ACT inhaler    Sig: Inhale 2 puffs into the lungs every 4 (four) hours as needed for wheezing.    Dispense:  1 Inhaler    Refill:  0    Order Specific Question:  Supervising Provider    Answer:  Jake Michaelis, DAVID C [id number]  . levofloxacin (LEVAQUIN) 500 MG tablet    Sig: Take 1 tablet (500 mg total) by mouth daily.    Dispense:  7 tablet    Refill:  0    Order Specific Question:  Supervising Provider    Answer:  Jake Michaelis, DAVID C [[id number]       Liam Graham, PA-C 05/20/14 1625

## 2014-05-20 NOTE — ED Notes (Signed)
Unable to assess O2 Sat. Patient has acrylic finger nails. Also tried it on her toes with no results. Provider Z. Baker informed.

## 2014-05-20 NOTE — Discharge Instructions (Signed)

## 2014-05-20 NOTE — ED Notes (Signed)
Pt  Reports         Cough      Congested     -  sorethroat           Pt  Is  A  Smoker             Symptoms  X  sev  Weeks           Cap  Refill  Is     Sluggish    Pt  Is  Awake   And  Alert

## 2014-05-26 ENCOUNTER — Other Ambulatory Visit: Payer: Self-pay | Admitting: Adult Health

## 2014-05-26 ENCOUNTER — Ambulatory Visit (INDEPENDENT_AMBULATORY_CARE_PROVIDER_SITE_OTHER): Payer: Medicare Other | Admitting: Internal Medicine

## 2014-05-26 ENCOUNTER — Encounter: Payer: Self-pay | Admitting: Internal Medicine

## 2014-05-26 VITALS — BP 132/72 | HR 81 | Temp 98.5°F | Wt 153.0 lb

## 2014-05-26 DIAGNOSIS — I10 Essential (primary) hypertension: Secondary | ICD-10-CM

## 2014-05-26 DIAGNOSIS — J438 Other emphysema: Secondary | ICD-10-CM

## 2014-05-26 DIAGNOSIS — J209 Acute bronchitis, unspecified: Secondary | ICD-10-CM | POA: Insufficient documentation

## 2014-05-26 MED ORDER — LEVOFLOXACIN 250 MG PO TABS: 250.0000 mg | ORAL_TABLET | Freq: Every day | ORAL | Status: DC

## 2014-05-26 MED ORDER — BENZONATATE 100 MG PO CAPS: ORAL_CAPSULE | ORAL | Status: DC

## 2014-05-26 NOTE — Progress Notes (Signed)
Subjective:    Patient ID: [name] [name], female    DOB: 1941/05/09, 73 y.o.   MRN: [id number]  HPIn Here with acute onset mild to mod 2-3 days ST, HA, general weakness and malaise, with prod cough greenish sputum, but Pt denies chest pain, increased sob or doe, wheezing, orthopnea, PND, increased LE swelling, palpitations, dizziness or syncope. Pt denies new neurological symptoms such as new headache, or facial or extremity weakness or numbness   Pt denies polydipsia, polyuria,  Has hx of renal cancer, also ESRD with HD Past Medical History  Diagnosis Date  . HYPOTHYROIDISM 02/17/2007    s/p surgical removal of goiter in 1997  . DIABETES MELLITUS, TYPE II 02/17/2007  . HYPERLIPIDEMIA 02/17/2007  . GOUT 05/29/2007  . ANEMIA-NOS 05/29/2007  . ANXIETY 03/23/2010  . 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 DEPRESSION 02/17/2007  . RESTLESS LEG SYNDROME 05/29/2007  . COMMON MIGRAINE 05/29/2007  . PERIPHERAL NEUROPATHY 05/29/2007  . HYPERTENSION 02/17/2007  . PERIPHERAL VASCULAR DISEASE 02/17/2007  . PEPTIC ULCER DISEASE 05/29/2007  . ESRD on hemodialysis 02/17/2007    Started dialysis April 2014.  Gets HD at [facility] on TTS schedule.  Cause of ESRD was HTN.     . OSTEOPENIA 09/22/2009  . SEIZURE DISORDER 02/17/2007  . Memory loss 01/24/2010  . Personal history of colonic polyps 11/16/2009  . Palpitations 09/08/2010  . Other diseases of vocal cords 1996  . HEART MURMUR, HX OF 10/02/2010  . 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 Chronic neck pain 12/13/2010  . GERD (gastroesophageal reflux disease)   . 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 Cancer of kidney 10/07/2012    Followed per Dr Despina Pole, MD, urology, [place]   . Arthritis   . Critical lower limb ischemia    Past Surgical History  Procedure Laterality Date  . Left toe amputated  2006  . Bunionectomy  1980  . Goiter removal  1997  . Stress cardiolite  06/18/2006  . Tranthoracic echocardiogram   06/18/2006  . Electrocardiogram  05/29/2006  . Cholecystectomy    . Rotator cuff repair  Left    Dr. Sharol Given  . Av fistula placement  03/13/2012    Procedure: ARTERIOVENOUS (AV) FISTULA CREATION;  Surgeon: Conrad Ship Bottom, MD;  Location: [place];  Service: Vascular;  Laterality: Right;  . Eye surgery      bilateral cataract removal  . Esophagoscopy w/ botox injection  07/22/2012    Procedure: ESOPHAGOSCOPY WITH BOTOX INJECTION;  Surgeon: Rozetta Nunnery, MD;  Location: [place];  Service: ENT;  Laterality: N/A;  esophageal dilation  . Insertion of dialysis catheter N/A 02/05/2013    Procedure: INSERTION OF DIALYSIS CATHETER;  Surgeon: Angelia Mould, MD;  Location: [place];  Service: Vascular;  Laterality: N/A;  Ultrasound guided  . Toe amputation Right Aug. 2015    2nd     reports that she has quit smoking. Her smoking use included Cigarettes. She has a 11.75 pack-year smoking history. She has never used smokeless tobacco. She reports that she does not drink alcohol or use illicit drugs. family history includes Coronary artery disease in her other; Dementia in her mother; Heart attack in her brother; Hyperlipidemia in her other; Hypertension in her brother, mother, other, and sister; Ovarian cancer in her other; Stroke in her brother and other. Allergies  Allergen Reactions  . Cephalosporins Itching and Rash    Vanc and  fortaz given at the same time in June for several doses at dialysis with systemic rash and itching; received zinacef 7/5 and had worseningsystemicrash/ itching and swelling of eyes - so unclear if allergic to either or both  . Nsaids     REACTION: renal dysfunction  . Pioglitazone     Actos REACTION: EDEMA  . Vancomycin     See comment under cephalosporin   Current Outpatient Prescriptions on File Prior to Visit  Medication Sig Dispense Refill  . acetaminophen (TYLENOL) 325 MG tablet Take 2 tablets (650 mg total) by mouth every 6 (six) hours as needed  for mild pain (or Fever >/= 101).      Marland Kitchen albuterol (PROVENTIL HFA;VENTOLIN HFA) 108 (90 BASE) MCG/ACT inhaler Inhale 2 puffs into the lungs every 6 (six) hours as needed for wheezing or shortness of breath.  1 Inhaler  11  . allopurinol (ZYLOPRIM) 100 MG tablet Take 100 mg by mouth daily.       Marland Kitchen amLODipine (NORVASC) 5 MG tablet Take 1 tablet (5 mg total) by mouth daily.  90 tablet  3  . aspirin EC 81 MG tablet Take 81 mg by mouth daily.      Marland Kitchen atorvastatin (LIPITOR) 40 MG tablet TAKE ONE TABLET BY MOUTH ONCE DAILY  30 tablet  5  . cinacalcet (SENSIPAR) 30 MG tablet Take 1 tablet (30 mg total) by mouth daily.  60 tablet  11  . clonazePAM (KLONOPIN) 2 MG tablet Take one tablet by mouth twice daily as needed for anxiety  60 tablet  5  . colchicine 0.6 MG tablet Take 0.6 mg by mouth daily as needed.      . cyclobenzaprine (FLEXERIL) 10 MG tablet Take 10 mg by mouth 3 (three) times daily as needed for muscle spasms.       . darbepoetin (ARANESP) 25 MCG/0.42ML SOLN injection Inject 25 mcg into the vein every 7 (seven) days. Every Thursday at dialysis      . docusate sodium (COLACE) 100 MG capsule Take 100 mg by mouth 2 (two) times daily as needed for mild constipation.       Marland Kitchen doxercalciferol (HECTOROL) 4 MCG/2ML injection Inject 4 mcg into the vein Every Tuesday,Thursday,and Saturday with dialysis.      . DULoxetine (CYMBALTA) 30 MG capsule Take 60 mg by mouth daily.      Marland Kitchen HYDROcodone-acetaminophen (NORCO/VICODIN) 5-325 MG per tablet Take 1 tablet by mouth every 6 (six) hours as needed for severe pain.  120 tablet  0  . hydrOXYzine (ATARAX/VISTARIL) 25 MG tablet Take 25 mg by mouth every 6 (six) hours as needed for anxiety.      Marland Kitchen lactulose (CHRONULAC) 10 GM/15ML solution Take 20 g by mouth daily.      Marland Kitchen levothyroxine (SYNTHROID, LEVOTHROID) 125 MCG tablet Take 125 mcg by mouth daily before breakfast.      . Melatonin 3 MG TABS Take 3 mg by mouth at bedtime as needed.      . mometasone-formoterol  (DULERA) 100-5 MCG/ACT AERO Inhale 2 puffs into the lungs 2 (two) times daily.  1 Inhaler  6  . montelukast (SINGULAIR) 10 MG tablet Take 10 mg by mouth at bedtime.      . multivitamin (RENA-VIT) TABS tablet Take 1 tablet by mouth daily.      Marland Kitchen omeprazole (PRILOSEC) 40 MG capsule Take 40 mg by mouth daily.       . ondansetron (ZOFRAN) 4 MG tablet Take 4 mg by mouth  every 6 (six) hours as needed for nausea or vomiting.      . polyethylene glycol (MIRALAX / GLYCOLAX) packet Take 17 g by mouth daily.      . predniSONE (DELTASONE) 50 MG tablet Take 1 tablet (50 mg total) by mouth daily with breakfast.  4 tablet  0  . QUEtiapine (SEROQUEL) 100 MG tablet Take 1 tablet (100 mg total) by mouth at bedtime.  30 tablet  0  . sevelamer carbonate (RENVELA) 800 MG tablet Take 2,400 mg by mouth 3 (three) times daily with meals.       No current facility-administered medications on file prior to visit.   Review of Systems  Constitutional: Negative for unusual diaphoresis or other sweats  HENT: Negative for ringing in ear Eyes: Negative for double vision or worsening visual disturbance.  Respiratory: Negative for choking and stridor.   Gastrointestinal: Negative for vomiting or other signifcant bowel change Genitourinary: Negative for hematuria or decreased urine volume.  Musculoskeletal: Negative for other MSK pain or swelling Skin: Negative for color change and worsening wound.  Neurological: Negative for tremors and numbness other than noted  Psychiatric/Behavioral: Negative for decreased concentration or agitation other than above       Objective:   Physical Exam BP 132/72  Pulse 81  Temp(Src) 98.5 F (36.9 C) (Oral)  Wt 153 lb (69.4 kg) VS noted, mild ill Constitutional: Pt appears well-developed, well-nourished.  HENT: Head: NCAT.  Right Ear: External ear normal.  Left Ear: External ear normal.  Eyes: . Pupils are equal, round, and reactive to light. Conjunctivae and EOM are normal Bilat  tm's with mild erythema.  Max sinus areas non tender.  Pharynx with mild erythema, no exudate Neck: Normal range of motion. Neck supple.  Cardiovascular: Normal rate and regular rhythm.   Pulmonary/Chest: Effort normal and breath sounds with few RLL crackles, no wheezing.  Abd:  Soft, NT, ND, + BS Neurological: Pt is alert. Not confused , motor grossly intact Skin: Skin is warm. No rash Psychiatric: Pt behavior is normal. No agitation.     Assessment & Plan:

## 2014-05-26 NOTE — Patient Instructions (Signed)
Please take all new medication as prescribed - the antibiotic , and cough pills  Please continue all other medications as before,   Please have the pharmacy call with any other refills you may need.  Please keep your appointments with your specialists as you may have planned

## 2014-05-26 NOTE — Assessment & Plan Note (Signed)
Mild to mod, for antibx course,  to f/u any worsening symptoms or concerns, with RLL crackles cant r/o pna, declines cxr for now, to f/u any worsening s/s

## 2014-05-26 NOTE — Progress Notes (Signed)
Pre visit review using our clinic review tool, if applicable. No additional management support is needed unless otherwise documented below in the visit note. 

## 2014-05-26 NOTE — Assessment & Plan Note (Signed)
stable overall by history and exam, recent data reviewed with pt, and pt to continue medical treatment as before,  to f/u any worsening symptoms or concerns BP Readings from Last 3 Encounters:  05/26/14 132/72  05/20/14 130/72  05/05/14 137/69

## 2014-05-26 NOTE — Assessment & Plan Note (Signed)
stable overall by history and exam, recent data reviewed with pt, and pt to continue medical treatment as before,  to f/u any worsening symptoms or concerns SpO2 Readings from Last 3 Encounters:  04/30/14 100%  04/19/14 96%  04/07/14 96%

## 2014-06-04 ENCOUNTER — Other Ambulatory Visit: Payer: Self-pay | Admitting: Adult Health

## 2014-06-05 NOTE — ED Provider Notes (Signed)
Medical screening examination/treatment/procedure(s) were performed by a resident physician or non-physician practitioner and as the supervising physician I was immediately available for consultation/collaboration.  Linna Darner, MD Family Medicine   Waldemar Dickens, MD 06/05/14 [phone]

## 2014-06-06 ENCOUNTER — Other Ambulatory Visit: Payer: Self-pay | Admitting: Internal Medicine

## 2014-06-07 DIAGNOSIS — E119 Type 2 diabetes mellitus without complications: Secondary | ICD-10-CM

## 2014-06-07 DIAGNOSIS — N186 End stage renal disease: Secondary | ICD-10-CM

## 2014-06-07 DIAGNOSIS — I120 Hypertensive chronic kidney disease with stage 5 chronic kidney disease or end stage renal disease: Secondary | ICD-10-CM

## 2014-06-07 DIAGNOSIS — D631 Anemia in chronic kidney disease: Secondary | ICD-10-CM

## 2014-06-11 ENCOUNTER — Other Ambulatory Visit: Payer: Self-pay | Admitting: Adult Health

## 2014-06-11 ENCOUNTER — Encounter: Payer: Self-pay | Admitting: Podiatrist

## 2014-06-11 ENCOUNTER — Ambulatory Visit (INDEPENDENT_AMBULATORY_CARE_PROVIDER_SITE_OTHER): Payer: Medicare Other | Admitting: Podiatrist

## 2014-06-11 ENCOUNTER — Ambulatory Visit (INDEPENDENT_AMBULATORY_CARE_PROVIDER_SITE_OTHER): Payer: Medicare Other

## 2014-06-11 VITALS — BP 119/62 | HR 105 | Resp 16

## 2014-06-11 DIAGNOSIS — Z89421 Acquired absence of other right toe(s): Secondary | ICD-10-CM

## 2014-06-11 NOTE — Progress Notes (Signed)
   Subjective: Patient presents today for follow-up of amputation right second toe.  She states that the second toe amputation site is feeling well however She also relates pain in the right third toe.  Objective: Vascular status continues to reveal fairly palpable pedal pulses DP right. Capillary refill time is decreased. Third toe has decreased capillary fill time noted. Callus at the distal tip of the toe was also noted with a thickened, mycotic nail present as well. Concern for a similar problem that she had on the right second toe which was gangrene of the digit itself is noted.  Assessment: Status post right second toe amputation, questionable circulation and pain right third toe  Plan: Debrided the toenail and debrided the callus without complication hoping this was the area of discomfort. Did have discussion with her that this could be a similar condition that happened to the right second toe and gangrene could be occurring. Unfortunately it at the digital level and therefore intervention is unlikely. We will keep a close eye on the toe and she will watch for any darkening or discoloration of the toe itself.

## 2014-06-14 ENCOUNTER — Other Ambulatory Visit: Payer: Self-pay | Admitting: Adult Health

## 2014-06-14 ENCOUNTER — Other Ambulatory Visit: Payer: Self-pay | Admitting: Internal Medicine

## 2014-06-15 ENCOUNTER — Other Ambulatory Visit: Payer: Self-pay

## 2014-06-15 MED ORDER — HYDROXYZINE HCL 25 MG PO TABS: 25.0000 mg | ORAL_TABLET | Freq: Four times a day (QID) | ORAL | Status: DC | PRN

## 2014-06-15 MED ORDER — DULOXETINE HCL 30 MG PO CPEP: 60.0000 mg | ORAL_CAPSULE | Freq: Every day | ORAL | Status: DC

## 2014-06-15 MED ORDER — ALBUTEROL SULFATE HFA 108 (90 BASE) MCG/ACT IN AERS: 2.0000 | INHALATION_SPRAY | Freq: Four times a day (QID) | RESPIRATORY_TRACT | Status: DC | PRN

## 2014-06-15 NOTE — Telephone Encounter (Signed)
Called informed the patient of MD instructions regarding refill.  The patient wanted PCP to know that she is still having hoarseness as discussed at her last OV.

## 2014-06-15 NOTE — Telephone Encounter (Signed)
i dont normally prescribe this medication,  Please have pt request refill from renal

## 2014-06-16 ENCOUNTER — Encounter: Payer: Medicare Other | Admitting: Podiatrist

## 2014-06-16 DIAGNOSIS — N186 End stage renal disease: Secondary | ICD-10-CM

## 2014-06-16 DIAGNOSIS — E119 Type 2 diabetes mellitus without complications: Secondary | ICD-10-CM

## 2014-06-16 DIAGNOSIS — D631 Anemia in chronic kidney disease: Secondary | ICD-10-CM

## 2014-06-16 DIAGNOSIS — I120 Hypertensive chronic kidney disease with stage 5 chronic kidney disease or end stage renal disease: Secondary | ICD-10-CM

## 2014-07-07 ENCOUNTER — Encounter: Payer: Medicare Other | Admitting: Podiatrist

## 2014-07-09 ENCOUNTER — Other Ambulatory Visit: Payer: Self-pay | Admitting: Internal Medicine

## 2014-07-09 ENCOUNTER — Other Ambulatory Visit: Payer: Self-pay | Admitting: Adult Health

## 2014-07-09 NOTE — Telephone Encounter (Signed)
klonazepam too soon  Had 6 mo refills starting sept 15

## 2014-07-10 ENCOUNTER — Other Ambulatory Visit: Payer: Self-pay | Admitting: Adult Health

## 2014-07-12 ENCOUNTER — Telehealth: Payer: Self-pay | Admitting: *Deleted

## 2014-07-12 NOTE — Telephone Encounter (Signed)
[facility] - Client TELEPHONE ADVICE RECORD [facility] Medical Call Center Patient Name: [facility] Gender: Female DOB: 1940/09/16 Age: 73 Y 13 M 28 D Return Phone Number: [phone] (Primary), [phone] (Secondary) Address: [address] City/State/Zip: [place] [address] Client [facility] - Client Client Site [place] - Day Physician Cathlean Cower Contact Type Call Call Type Triage / Clinical Relationship To Patient Self Return Phone Number [phone] (Primary) Chief Complaint Foot Pain Initial Comment Caller States she is having neuropathy in her right foot. PreDisposition Did not know what to do Nurse Assessment Nurse: Orvan Seen, RN, Jacquilin Date/Time (Eastern Time): 07/12/2014 2:34:13 PM Confirm and document reason for call. If symptomatic, describe symptoms. ---Caller States she is having neuropathy in her right foot. -Has been taking Lyrica and Cymbalta - Pain a 10/10 Has the patient traveled out of the country within the last 30 days? ---Not Applicable Does the patient require triage? ---Yes Related visit to physician within the last 2 weeks? ---No Does the PT have any chronic conditions? (i.e. diabetes, asthma, etc.) ---Yes List chronic conditions. ---neuropathy Guidelines Guideline Title Affirmed Question Affirmed Notes Nurse Date/Time Eilene Ghazi Time) Foot Pain [1] SEVERE pain (e.g., excruciating, unable to do any normal activities) AND [2] not improved after 2 hours of pain medicine Atkins, RN, Jacquilin 07/12/2014 2:35:01 PM Disp. Time Eilene Ghazi Time) Disposition Final User 07/12/2014 2:42:52 PM See Physician within 4 Hours (or PCP triage) Yes Orvan Seen, RN, Jacquilin Caller Understands: Yes Disagree/Comply: Disagree PLEASE NOTE: All timestamps contained within this report are represented as [place] Standard Time. CONFIDENTIALTY NOTICE: This fax transmission is intended only for the addressee. It  contains information that is legally privileged, confidential or otherwise protected from use or disclosure. If you are not the intended recipient, you are strictly prohibited from reviewing, disclosing, copying using or disseminating any of this information or taking any action in reliance on or regarding this information. If you have received this fax in error, please notify us immediately by telephone so that we can arrange for its return to [place]. Phone: [phone], Toll-Free: [phone], Fax: [phone] Page: 2 of 2 Call Id: [id number] Disagree/Comply Reason: Karen Kays to find transportation Care Advice Given Per Guideline SEE PHYSICIAN WITHIN 4 HOURS (or PCP triage): * IF NO PCP TRIAGE: You need to be seen. Go to _______________ (ED/UCC or office if it will be open) within the next 3 or 4 hours. Go sooner if you become worse. CALL BACK IF: * You become worse. CARE ADVICE given per Foot Pain (Adult) guideline. After Care Instructions Given Call Event Type User Date / Time Description Comments User: Donnie Aho, RN Date/Time Eilene Ghazi Time): 07/12/2014 2:43:44 PM spoke with office they state the PCP is not in the office today and they are booked- Advised patient of this and outcome Referrals REFERRED TO PCP OFFICE

## 2014-07-13 ENCOUNTER — Other Ambulatory Visit: Payer: Self-pay | Admitting: *Deleted

## 2014-07-13 MED ORDER — CYCLOBENZAPRINE HCL 10 MG PO TABS: 10.0000 mg | ORAL_TABLET | Freq: Three times a day (TID) | ORAL | Status: DC | PRN

## 2014-07-13 MED ORDER — OMEPRAZOLE 40 MG PO CPDR: 40.0000 mg | DELAYED_RELEASE_CAPSULE | Freq: Every day | ORAL | Status: DC

## 2014-07-14 ENCOUNTER — Other Ambulatory Visit: Payer: Self-pay | Admitting: Internal Medicine

## 2014-07-14 ENCOUNTER — Encounter: Payer: Medicare Other | Admitting: Podiatrist

## 2014-07-14 NOTE — Telephone Encounter (Signed)
I am not comfortable with continuing to prescribe this medication for sleep as she is on 5 other potentially psychoactive/psycho-effecting medications

## 2014-07-20 ENCOUNTER — Other Ambulatory Visit: Payer: Self-pay | Admitting: Adult Health

## 2014-07-21 ENCOUNTER — Other Ambulatory Visit: Payer: Self-pay | Admitting: Adult Health

## 2014-07-26 ENCOUNTER — Other Ambulatory Visit: Payer: Self-pay | Admitting: Adult Health

## 2014-07-26 ENCOUNTER — Other Ambulatory Visit: Payer: Self-pay | Admitting: Internal Medicine

## 2014-07-28 ENCOUNTER — Encounter: Payer: Self-pay | Admitting: Podiatrist

## 2014-07-28 ENCOUNTER — Ambulatory Visit (INDEPENDENT_AMBULATORY_CARE_PROVIDER_SITE_OTHER): Payer: Medicare Other

## 2014-07-28 ENCOUNTER — Ambulatory Visit (INDEPENDENT_AMBULATORY_CARE_PROVIDER_SITE_OTHER): Payer: Medicare Other | Admitting: Podiatrist

## 2014-07-28 VITALS — BP 100/59 | HR 98 | Resp 16

## 2014-07-28 DIAGNOSIS — Z89421 Acquired absence of other right toe(s): Secondary | ICD-10-CM

## 2014-07-28 DIAGNOSIS — I739 Peripheral vascular disease, unspecified: Secondary | ICD-10-CM

## 2014-07-28 NOTE — Patient Instructions (Signed)
I am sending you to speak with Dr. Quay Burow to see if there is anything he can suggest to help your circulation--  We will call to set up the appointment and their office will be contacting you about the date and time you will be seen.

## 2014-07-28 NOTE — Progress Notes (Signed)
   Subjective: Patient presents today for follow-up of amputation right second toe.  She states that the second toe amputation site is feeling well however She continues to have pain in the right third toe.  Objective: Vascular status continues to reveal faint palpable pedal pulses DP right. Capillary refill time is decreased. Third toe has decreased capillary fill time noted. Callus at the distal tip of the toe was also noted with a thickened, mycotic nail present as well. Concern for a similar problem that she had on the right second toe which was gangrene of the digit itself is noted.  Assessment: Status post right second toe amputation, questionable circulation and pain right third toe  Plan: Debrided the toenail and debrided the callus without complication.  Recommended a follow-up with Dr. Gwenlyn Found regarding her circulation study to see if there is any options for her to prevent an ulceration and gangrene on the right third toe. We will set up the appointment for her. I will see her back in 2 weeks for follow-up.

## 2014-08-02 ENCOUNTER — Telehealth: Payer: Self-pay | Admitting: *Deleted

## 2014-08-02 DIAGNOSIS — I739 Peripheral vascular disease, unspecified: Secondary | ICD-10-CM

## 2014-08-02 NOTE — Telephone Encounter (Signed)
Per Dr. Valentina Lucks, I faxed a referral to Dr. Gwenlyn Found for a consultation.

## 2014-08-02 NOTE — Telephone Encounter (Signed)
-----   Message from Bronson Ing, DPM sent at 07/28/2014  3:46 PM EST ----- Regarding: vascular testing- with Dr. Glori Luis,  Can you set [name] [name] up for a consult with Dr. Gwenlyn Found regarding her right foot-  We did a amputation of the 2nd toe for gangrene and it is fine but the 3rd toe is starting to hurt her.  Wanted to follow up regarding her last study.  Thanks!!  E

## 2014-08-03 ENCOUNTER — Emergency Department (HOSPITAL_COMMUNITY): Payer: Medicare Other

## 2014-08-03 ENCOUNTER — Inpatient Hospital Stay (HOSPITAL_COMMUNITY)
Admission: EM | Admit: 2014-08-03 | Discharge: 2014-08-05 | DRG: 070 | Disposition: A | Discharge status: Home / Self Care | Payer: Medicare Other | Attending: Family Medicine | Admitting: Family Medicine

## 2014-08-03 ENCOUNTER — Encounter (HOSPITAL_COMMUNITY): Payer: Self-pay | Admitting: Emergency Medicine

## 2014-08-03 ENCOUNTER — Ambulatory Visit: Payer: Medicare Other | Admitting: Cardiovascular Disease

## 2014-08-03 DIAGNOSIS — Z89421 Acquired absence of other right toe(s): Secondary | ICD-10-CM

## 2014-08-03 DIAGNOSIS — R0902 Hypoxemia: Secondary | ICD-10-CM

## 2014-08-03 DIAGNOSIS — E039 Hypothyroidism, unspecified: Secondary | ICD-10-CM | POA: Diagnosis present

## 2014-08-03 DIAGNOSIS — J449 Chronic obstructive pulmonary disease, unspecified: Secondary | ICD-10-CM | POA: Diagnosis not present

## 2014-08-03 DIAGNOSIS — I871 Compression of vein: Secondary | ICD-10-CM | POA: Diagnosis present

## 2014-08-03 DIAGNOSIS — R4182 Altered mental status, unspecified: Secondary | ICD-10-CM | POA: Diagnosis not present

## 2014-08-03 DIAGNOSIS — Z7982 Long term (current) use of aspirin: Secondary | ICD-10-CM

## 2014-08-03 DIAGNOSIS — K279 Peptic ulcer, site unspecified, unspecified as acute or chronic, without hemorrhage or perforation: Secondary | ICD-10-CM | POA: Diagnosis present

## 2014-08-03 DIAGNOSIS — D696 Thrombocytopenia, unspecified: Secondary | ICD-10-CM | POA: Diagnosis not present

## 2014-08-03 DIAGNOSIS — Z992 Dependence on renal dialysis: Secondary | ICD-10-CM

## 2014-08-03 DIAGNOSIS — Z7952 Long term (current) use of systemic steroids: Secondary | ICD-10-CM

## 2014-08-03 DIAGNOSIS — D649 Anemia, unspecified: Secondary | ICD-10-CM | POA: Diagnosis present

## 2014-08-03 DIAGNOSIS — K219 Gastro-esophageal reflux disease without esophagitis: Secondary | ICD-10-CM | POA: Diagnosis present

## 2014-08-03 DIAGNOSIS — E875 Hyperkalemia: Secondary | ICD-10-CM | POA: Diagnosis present

## 2014-08-03 DIAGNOSIS — N390 Urinary tract infection, site not specified: Secondary | ICD-10-CM | POA: Diagnosis present

## 2014-08-03 DIAGNOSIS — Z881 Allergy status to other antibiotic agents status: Secondary | ICD-10-CM

## 2014-08-03 DIAGNOSIS — D6959 Other secondary thrombocytopenia: Secondary | ICD-10-CM | POA: Diagnosis present

## 2014-08-03 DIAGNOSIS — J45909 Unspecified asthma, uncomplicated: Secondary | ICD-10-CM | POA: Diagnosis present

## 2014-08-03 DIAGNOSIS — I739 Peripheral vascular disease, unspecified: Secondary | ICD-10-CM | POA: Diagnosis present

## 2014-08-03 DIAGNOSIS — G9340 Encephalopathy, unspecified: Principal | ICD-10-CM | POA: Diagnosis present

## 2014-08-03 DIAGNOSIS — E038 Other specified hypothyroidism: Secondary | ICD-10-CM

## 2014-08-03 DIAGNOSIS — E119 Type 2 diabetes mellitus without complications: Secondary | ICD-10-CM | POA: Diagnosis present

## 2014-08-03 DIAGNOSIS — M1990 Unspecified osteoarthritis, unspecified site: Secondary | ICD-10-CM | POA: Diagnosis present

## 2014-08-03 DIAGNOSIS — M109 Gout, unspecified: Secondary | ICD-10-CM | POA: Diagnosis present

## 2014-08-03 DIAGNOSIS — I10 Essential (primary) hypertension: Secondary | ICD-10-CM | POA: Diagnosis present

## 2014-08-03 DIAGNOSIS — G2581 Restless legs syndrome: Secondary | ICD-10-CM | POA: Diagnosis present

## 2014-08-03 DIAGNOSIS — E785 Hyperlipidemia, unspecified: Secondary | ICD-10-CM | POA: Diagnosis present

## 2014-08-03 DIAGNOSIS — Z888 Allergy status to other drugs, medicaments and biological substances status: Secondary | ICD-10-CM

## 2014-08-03 DIAGNOSIS — N186 End stage renal disease: Secondary | ICD-10-CM | POA: Diagnosis not present

## 2014-08-03 DIAGNOSIS — Z89422 Acquired absence of other left toe(s): Secondary | ICD-10-CM

## 2014-08-03 DIAGNOSIS — Z9049 Acquired absence of other specified parts of digestive tract: Secondary | ICD-10-CM | POA: Diagnosis present

## 2014-08-03 DIAGNOSIS — G43009 Migraine without aura, not intractable, without status migrainosus: Secondary | ICD-10-CM | POA: Diagnosis present

## 2014-08-03 DIAGNOSIS — Z87891 Personal history of nicotine dependence: Secondary | ICD-10-CM

## 2014-08-03 DIAGNOSIS — R569 Unspecified convulsions: Secondary | ICD-10-CM

## 2014-08-03 DIAGNOSIS — I120 Hypertensive chronic kidney disease with stage 5 chronic kidney disease or end stage renal disease: Secondary | ICD-10-CM | POA: Diagnosis present

## 2014-08-03 DIAGNOSIS — G40909 Epilepsy, unspecified, not intractable, without status epilepticus: Secondary | ICD-10-CM | POA: Diagnosis present

## 2014-08-03 DIAGNOSIS — Z8711 Personal history of peptic ulcer disease: Secondary | ICD-10-CM

## 2014-08-03 DIAGNOSIS — J441 Chronic obstructive pulmonary disease with (acute) exacerbation: Secondary | ICD-10-CM | POA: Diagnosis present

## 2014-08-03 DIAGNOSIS — Z86010 Personal history of colonic polyps: Secondary | ICD-10-CM

## 2014-08-03 DIAGNOSIS — C649 Malignant neoplasm of unspecified kidney, except renal pelvis: Secondary | ICD-10-CM | POA: Diagnosis present

## 2014-08-03 LAB — I-STAT ARTERIAL BLOOD GAS, ED
Acid-Base Excess: 3 mmol/L — ABNORMAL HIGH (ref 0.0–2.0)
Bicarbonate: 29.8 mEq/L — ABNORMAL HIGH (ref 20.0–24.0)
O2 Saturation: 94 %
Patient temperature: 98.6
TCO2: 31 mmol/L (ref 0–100)
pCO2 arterial: 55.6 mmHg — ABNORMAL HIGH (ref 35.0–45.0)
pH, Arterial: 7.338 — ABNORMAL LOW (ref 7.350–7.450)
pO2, Arterial: 78 mmHg — ABNORMAL LOW (ref 80.0–100.0)

## 2014-08-03 LAB — COMPREHENSIVE METABOLIC PANEL
ALT: 25 U/L (ref 0–35)
AST: 38 U/L — ABNORMAL HIGH (ref 0–37)
Albumin: 3.4 g/dL — ABNORMAL LOW (ref 3.5–5.2)
Alkaline Phosphatase: 94 U/L (ref 39–117)
Anion gap: 12 (ref 5–15)
BUN: 31 mg/dL — ABNORMAL HIGH (ref 6–23)
CO2: 27 mmol/L (ref 19–32)
Calcium: 8.2 mg/dL — ABNORMAL LOW (ref 8.4–10.5)
Chloride: 98 mEq/L (ref 96–112)
Creatinine, Ser: 7.28 mg/dL — ABNORMAL HIGH (ref 0.50–1.10)
GFR calc Af Amer: 6 mL/min — ABNORMAL LOW (ref >90)
GFR calc non Af Amer: 5 mL/min — ABNORMAL LOW (ref >90)
Glucose, Bld: 83 mg/dL (ref 70–99)
Potassium: 4.8 mmol/L (ref 3.5–5.1)
Sodium: 137 mmol/L (ref 135–145)
Total Bilirubin: 0.9 mg/dL (ref 0.3–1.2)
Total Protein: 6.7 g/dL (ref 6.0–8.3)

## 2014-08-03 LAB — CBC
HCT: 34.3 % — ABNORMAL LOW (ref 36.0–46.0)
Hemoglobin: 11.3 g/dL — ABNORMAL LOW (ref 12.0–15.0)
MCH: 31.5 pg (ref 26.0–34.0)
MCHC: 32.9 g/dL (ref 30.0–36.0)
MCV: 95.5 fL (ref 78.0–100.0)
Platelets: 37 10*3/uL — ABNORMAL LOW (ref 150–400)
RBC: 3.59 MIL/uL — ABNORMAL LOW (ref 3.87–5.11)
RDW: 16.2 % — ABNORMAL HIGH (ref 11.5–15.5)
WBC: 7.5 10*3/uL (ref 4.0–10.5)

## 2014-08-03 LAB — LACTIC ACID, PLASMA: Lactic Acid, Venous: 2.6 mmol/L — ABNORMAL HIGH (ref 0.5–2.2)

## 2014-08-03 LAB — PROTIME-INR
INR: 1.19 (ref 0.00–1.49)
Prothrombin Time: 15.2 seconds (ref 11.6–15.2)

## 2014-08-03 LAB — TROPONIN I: Troponin I: <0.03 ng/mL (ref <0.031)

## 2014-08-03 LAB — I-STAT TROPONIN, ED: Troponin i, poc: 0.02 ng/mL (ref 0.00–0.08)

## 2014-08-03 LAB — MAGNESIUM: Magnesium: 2.4 mg/dL (ref 1.5–2.5)

## 2014-08-03 LAB — CBG MONITORING, ED: Glucose-Capillary: 76 mg/dL (ref 70–99)

## 2014-08-03 LAB — BRAIN NATRIURETIC PEPTIDE: B Natriuretic Peptide: 223.3 pg/mL — ABNORMAL HIGH (ref 0.0–100.0)

## 2014-08-03 LAB — APTT: aPTT: 34 seconds (ref 24–37)

## 2014-08-03 MED ORDER — METHYLPREDNISOLONE SODIUM SUCC 125 MG IJ SOLR
80.0000 mg | Freq: Once | INTRAMUSCULAR | Status: AC
  Administered 2014-08-03: 80 mg via INTRAVENOUS
  Filled 2014-08-03: qty 2

## 2014-08-03 MED ORDER — SODIUM CHLORIDE 0.9 % IV BOLUS (SEPSIS)
500.0000 mL | Freq: Once | INTRAVENOUS | Status: AC
  Administered 2014-08-03: 500 mL via INTRAVENOUS

## 2014-08-03 MED ORDER — PIPERACILLIN-TAZOBACTAM IN DEX 2-0.25 GM/50ML IV SOLN
2.2500 g | Freq: Three times a day (TID) | INTRAVENOUS | Status: DC
  Administered 2014-08-03: 2.25 g via INTRAVENOUS
  Filled 2014-08-03 (×2): qty 50

## 2014-08-03 MED ORDER — IPRATROPIUM-ALBUTEROL 0.5-2.5 (3) MG/3ML IN SOLN
3.0000 mL | Freq: Once | RESPIRATORY_TRACT | Status: AC
  Administered 2014-08-03: 3 mL via RESPIRATORY_TRACT
  Filled 2014-08-03: qty 3

## 2014-08-03 MED ORDER — VANCOMYCIN HCL IN DEXTROSE 750-5 MG/150ML-% IV SOLN: 750.0000 mg | INTRAVENOUS | Status: DC

## 2014-08-03 MED ORDER — VANCOMYCIN HCL 10 G IV SOLR
1500.0000 mg | Freq: Once | INTRAVENOUS | Status: AC
  Administered 2014-08-03: 1500 mg via INTRAVENOUS
  Filled 2014-08-03: qty 1500

## 2014-08-03 NOTE — ED Notes (Signed)
RT notified that pt need breathing Tx and ABGs, pt on non-rebreather.

## 2014-08-03 NOTE — H&P (Signed)
[facility] History and Physical  [name] [name] CHE:[id number] DOB: [birth year] DOA: 08/03/2014  Referring physician: ED physician PCP: Cathlean Cower, MD  Specialists:   Chief Complaint: AMS  HPI: [name] [name] is a 73 y.o. female with PMH of ESRD on HD (T/T/S) , hx of seizures disorder, HTN, Hyperlipidemia, Hypothyroid, Anemia,  who presents with AMS.   Patient went to dialysis center for HD today. She was noticed by the staff to be slugish and slow to response when she got to dialysis center prior to starting dialysis. Dialysis center staff reported that pt's BP was running in low 90s. Per EMS, BP-93/36, CBG-71.   When I evaluated patient in Ed, she has slow response, but she is ordered to the time, person and place. She reports that she still urinates a little. This morning she started having dysuria, no burning on urination. She also reports having cough with brown-yellow colored sputum production. She has congestion and mild SOB. She does not have chest pain. She does not have subjective fever, but her temperature is 99.1 in emergency room. She denies fever, chills, headaches, chest pain, abdominal pain, diarrhea, hematuria, skin rashes, joint pain or leg swelling.  Work up in the ED demonstrates thrombocytopenia with platelet decreased from previous 242 on 04/03/14 to 37. Troponin negative. Lactate 2.6. INR 1.19. Chest x-ray shows atelectasis, but no infiltration. No leukocytosis. CT-head without acute abnormalities. Patient is admitted to inpatient for further evaluation and treatment.  Review of Systems: As presented in the history of presenting illness, rest negative.  Where does patient live? At home  Can patient participate in ADLs? little  Allergy:  Allergies  Allergen Reactions  . Cephalosporins Itching and Rash    Vanc and fortaz given at the same time in June for several doses at dialysis with systemic rash and itching; received zinacef 7/5 and had worseningsystemicrash/  itching and swelling of eyes - so unclear if allergic to either or both  . Nsaids     REACTION: renal dysfunction  . Pioglitazone     Actos REACTION: EDEMA  . Vancomycin     See comment under cephalosporin    Past Medical History  Diagnosis Date  . HYPOTHYROIDISM 02/17/2007    s/p surgical removal of goiter in 1997  . DIABETES MELLITUS, TYPE II 02/17/2007  . HYPERLIPIDEMIA 02/17/2007  . GOUT 05/29/2007  . ANEMIA-NOS 05/29/2007  . ANXIETY 03/23/2010  . 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 DEPRESSION 02/17/2007  . RESTLESS LEG SYNDROME 05/29/2007  . COMMON MIGRAINE 05/29/2007  . PERIPHERAL NEUROPATHY 05/29/2007  . HYPERTENSION 02/17/2007  . PERIPHERAL VASCULAR DISEASE 02/17/2007  . PEPTIC ULCER DISEASE 05/29/2007  . ESRD on hemodialysis 02/17/2007    Started dialysis April 2014.  Gets HD at [facility] on TTS schedule.  Cause of ESRD was HTN.     . OSTEOPENIA 09/22/2009  . SEIZURE DISORDER 02/17/2007  . Memory loss 01/24/2010  . Personal history of colonic polyps 11/16/2009  . Palpitations 09/08/2010  . Other diseases of vocal cords 1996  . HEART MURMUR, HX OF 10/02/2010  . 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 Chronic neck pain 12/13/2010  . GERD (gastroesophageal reflux disease)   . 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 Cancer of kidney 10/07/2012    Followed per Dr Despina Pole, MD, urology, [place]   . Arthritis   . Critical lower limb ischemia     Past Surgical History  Procedure Laterality  Date  . Left toe amputated  2006  . Bunionectomy  1980  . Goiter removal  1997  . Stress cardiolite  06/18/2006  . Tranthoracic echocardiogram  06/18/2006  . Electrocardiogram  05/29/2006  . Cholecystectomy    . Rotator cuff repair  Left    Dr. Sharol Given  . Av fistula placement  03/13/2012    Procedure: ARTERIOVENOUS (AV) FISTULA CREATION;  Surgeon: Conrad [place], MD;  Location: [place];  Service: Vascular;  Laterality: Right;  . Eye surgery      bilateral  cataract removal  . Esophagoscopy w/ botox injection  07/22/2012    Procedure: ESOPHAGOSCOPY WITH BOTOX INJECTION;  Surgeon: Rozetta Nunnery, MD;  Location: [place];  Service: ENT;  Laterality: N/A;  esophageal dilation  . Insertion of dialysis catheter N/A 02/05/2013    Procedure: INSERTION OF DIALYSIS CATHETER;  Surgeon: Angelia Mould, MD;  Location: [place];  Service: Vascular;  Laterality: N/A;  Ultrasound guided  . Toe amputation Right Aug. 2015    2nd     Social History:  reports that she has quit smoking. Her smoking use included Cigarettes. She has a 11.75 pack-year smoking history. She has never used smokeless tobacco. She reports that she does not drink alcohol or use illicit drugs.  Family History:  Family History  Problem Relation Age of Onset  . Dementia Mother   . Hypertension Mother   . Coronary artery disease Other   . Hyperlipidemia Other   . Hypertension Other   . Ovarian cancer Other   . Stroke Other   . Hypertension Sister   . Hypertension Brother   . Heart attack Brother   . Stroke Brother      Prior to Admission medications   Medication Sig Start Date End Date Taking? Authorizing Provider  acetaminophen (TYLENOL) 325 MG tablet Take 2 tablets (650 mg total) by mouth every 6 (six) hours as needed for mild pain (or Fever >/= 101). 04/05/14   Delfina Redwood, MD  albuterol (PROVENTIL HFA;VENTOLIN HFA) 108 (90 BASE) MCG/ACT inhaler Inhale 2 puffs into the lungs every 6 (six) hours as needed for wheezing or shortness of breath. 06/15/14   Biagio Borg, MD  allopurinol (ZYLOPRIM) 100 MG tablet Take 100 mg by mouth daily.  01/04/12   Historical Provider, MD  amLODipine (NORVASC) 5 MG tablet Take 1 tablet (5 mg total) by mouth daily. 04/12/14   Gerlene Fee, NP  aspirin EC 81 MG tablet Take 81 mg by mouth daily.    Historical Provider, MD  atorvastatin (LIPITOR) 40 MG tablet TAKE ONE TABLET BY MOUTH ONCE DAILY 05/10/14   Hendricks Limes, MD   benzonatate (TESSALON) 100 MG capsule TAKE ONE TO TWO CAPSULES BY MOUTH EVERY 6 HOURS AS NEEDED FOR COUGH 06/14/14   Biagio Borg, MD  cinacalcet (SENSIPAR) 30 MG tablet Take 1 tablet (30 mg total) by mouth daily. 04/12/14   Gerlene Fee, NP  clonazePAM [facility] [place]) 2 MG tablet Take one tablet by mouth twice daily as needed for anxiety 04/20/14   Estill Dooms, MD  colchicine 0.6 MG tablet Take 0.6 mg by mouth daily as needed.    Historical Provider, MD  cyclobenzaprine (FLEXERIL) 10 MG tablet Take 1 tablet (10 mg total) by mouth 3 (three) times daily as needed for muscle spasms. 07/13/14   Biagio Borg, MD  darbepoetin [facility]) 25 MCG/0.42ML SOLN injection Inject 25 mcg into the vein every 7 (seven)  days. Every Thursday at dialysis    Historical Provider, MD  docusate sodium (COLACE) 100 MG capsule Take 100 mg by mouth 2 (two) times daily as needed for mild constipation.     Historical Provider, MD  doxercalciferol (HECTOROL) 4 MCG/2ML injection Inject 4 mcg into the vein Every Tuesday,Thursday,and Saturday with dialysis.    Historical Provider, MD  DULoxetine (CYMBALTA) 30 MG capsule Take 2 capsules (60 mg total) by mouth daily. 06/15/14   Biagio Borg, MD  HYDROcodone-acetaminophen (NORCO/VICODIN) 5-325 MG per tablet Take 1 tablet by mouth every 6 (six) hours as needed for severe pain. 04/20/14   Estill Dooms, MD  hydrOXYzine (ATARAX/VISTARIL) 25 MG tablet TAKE ONE TABLET BY MOUTH EVERY 6 HOURS AS NEEDED 06/14/14   Biagio Borg, MD  hydrOXYzine (ATARAX/VISTARIL) 25 MG tablet Take 1 tablet (25 mg total) by mouth every 6 (six) hours as needed for anxiety. 06/15/14   Biagio Borg, MD  lactulose (CHRONULAC) 10 GM/15ML solution Take 20 g by mouth daily.    Historical Provider, MD  levothyroxine (SYNTHROID, LEVOTHROID) 125 MCG tablet Take 125 mcg by mouth daily before breakfast.    Historical Provider, MD  Melatonin 3 MG TABS Take 3 mg by mouth at bedtime as needed.    Historical Provider, MD   mometasone-formoterol (DULERA) 100-5 MCG/ACT AERO Inhale 2 puffs into the lungs 2 (two) times daily. 03/24/14   Biagio Borg, MD  montelukast (SINGULAIR) 10 MG tablet Take 10 mg by mouth at bedtime.    Historical Provider, MD  multivitamin (RENA-VIT) TABS tablet Take 1 tablet by mouth daily.    Historical Provider, MD  omeprazole (PRILOSEC) 40 MG capsule Take 1 capsule (40 mg total) by mouth daily. 07/13/14   Biagio Borg, MD  ondansetron (ZOFRAN) 4 MG tablet Take 4 mg by mouth every 6 (six) hours as needed for nausea or vomiting.    Historical Provider, MD  polyethylene glycol (MIRALAX / GLYCOLAX) packet Take 17 g by mouth daily.    Historical Provider, MD  predniSONE (DELTASONE) 50 MG tablet Take 1 tablet (50 mg total) by mouth daily with breakfast. 05/20/14   Liam Graham, PA-C  QUEtiapine (SEROQUEL) 100 MG tablet Take 1 tablet (100 mg total) by mouth at bedtime. 04/05/14   Delfina Redwood, MD  sevelamer carbonate (RENVELA) 800 MG tablet Take 2,400 mg by mouth 3 (three) times daily with meals.    Historical Provider, MD    Physical Exam: Filed Vitals:   08/03/14 2250 08/03/14 2310 08/03/14 2318 08/03/14 2345  BP:  96/70  103/58  Pulse:   78 80  Temp: 99.1 F (37.3 C)     TempSrc:      Resp:   17 16  Height:      Weight:      SpO2:   98% 94%   General: Not in acute distress HEENT:       Eyes: PERRL, EOMI, no scleral icterus       ENT: No discharge from the ears and nose, no pharynx injection, no tonsillar enlargement.        Neck: No JVD, no bruit, no mass felt. Cardiac: S1/S2, RRR, No murmurs, No gallops or rubs Pulm: Good air movement bilaterally. Clear to auscultation bilaterally. No rales, wheezing, rhonchi or rubs. Abd: Soft, nondistended, nontender, no rebound pain, no organomegaly, BS present Ext: No edema bilaterally. 2+DP/PT pulse bilaterally Musculoskeletal: No joint deformities, erythema, or stiffness, ROM full Skin: No rashes.  Neuro: drowsy, but alert and  oriented X3, cranial nerves II-XII grossly intact, muscle strength 5/5 in all extremeties, sensation to light touch intact. Brachial reflex 2+ bilaterally. Knee reflex 1+ bilaterally. Negative Babinski's sign. Normal finger to nose test. Psych: Patient is not psychotic, no suicidal or hemocidal ideation.  Labs on Admission:  Basic Metabolic Panel:  Recent Labs Lab 08/03/14 1848  NA 137  K 4.8  CL 98  CO2 27  GLUCOSE 83  BUN 31*  CREATININE 7.28*  CALCIUM 8.2*  MG 2.4   Liver Function Tests:  Recent Labs Lab 08/03/14 1848  AST 38*  ALT 25  ALKPHOS 94  BILITOT 0.9  PROT 6.7  ALBUMIN 3.4*   No results for input(s): LIPASE, AMYLASE in the last 168 hours. No results for input(s): AMMONIA in the last 168 hours. CBC:  Recent Labs Lab 08/03/14 1848  WBC 7.5  HGB 11.3*  HCT 34.3*  MCV 95.5  PLT 37*   Cardiac Enzymes:  Recent Labs Lab 08/03/14 1848  TROPONINI <0.03    BNP (last 3 results) No results for input(s): PROBNP in the last 8760 hours. CBG:  Recent Labs Lab 08/03/14 1824  GLUCAP 76    Radiological Exams on Admission: Ct Head Wo Contrast  08/03/2014   CLINICAL DATA:  Confusion.  EXAM: CT HEAD WITHOUT CONTRAST  TECHNIQUE: Contiguous axial images were obtained from the base of the skull through the vertex without intravenous contrast.  COMPARISON:  03/08/2012  FINDINGS: Skull and Sinuses:No acute fracture destructive process.  Mild inflammatory mucosal thickening in the right ethmoid sinuses.  Orbits: No acute findings.  Bilateral cataract resection.  Brain: No evidence of acute infarction, hemorrhage, hydrocephalus, or mass lesion/mass effect. Generalized cerebral volume loss which is typical for age and not progressed since 2013. No significant ischemic change.  IMPRESSION: Age normal head CT.   Electronically Signed   By: Jorje Guild M.D.   On: 08/03/2014 23:29   Dg Chest Portable 1 View  08/03/2014   CLINICAL DATA:  Acute onset of altered mental  status. Cough and shortness of breath. Hypoxemia.  EXAM: PORTABLE CHEST - 1 VIEW  COMPARISON:  Chest x-ray dated 05/20/2014 and 03/30/2014  FINDINGS: The heart size and pulmonary vascularity are normal. There is tortuosity and calcification of the thoracic aorta.  The patient has bibasilar atelectasis with a shallow inspiration. No effusions. Moderate thoracolumbar scoliosis. Fairly severe arthritis the left shoulder. No acute osseous abnormality.  IMPRESSION: Bibasilar atelectasis, right greater than left. At least some of this is due to a shallow inspiration.   Electronically Signed   By: Rozetta Nunnery M.D.   On: 08/03/2014 19:42    EKG: Independently reviewed.   Assessment/Plan Principal Problem:   Acute encephalopathy Active Problems:   Hypothyroidism   HLD (hyperlipidemia)   Gout   Essential hypertension   Peripheral vascular disease   Asthmatic bronchitis , chronic   GERD   Peptic ulcer   Seizure   Cancer of kidney   COPD (chronic obstructive pulmonary disease)   ESRD on hemodialysis  Acute encephalopathy: Etiology is not clear. CT-head is negative for acute abnormalities. Patient does not have signs of stroke. Potential differential diagnoses include UTI (patient has dysuria) and COPD exacerbation (patient has productive cough and shortness of breath). Another differential diagnosis is TTP given patient's altered mental status and new onset of thrombocytopenia. My impression is infection though source is not clear given mild fever with temperature 99.1 and increased lactate 2.6. Her thrombocytopenia  is also likely due to infection.  -will admit to tele bed -IV Levaquin for possible UTI and COPD exacerbation -DuoNeb and albuterol nebs -prednisone 50 mg daily -UA and urine culture -blood culture -rule out TTP: LDH, peripheral smear, haptoglobin, and INR, PTT  Thrombocytopenia: Likely due to infection. - needed to rule out TTP as above -CBC in AM  HTN: -hold amlodipine given  soft bp -given gentle IVF: NS 75 cc/h for 6 hours  ESRD-HD (TTS): BUN 31 and Cre 7.28, K=4.8 -left message to renal for HD   Hypothyroidism: Last TSH was 3.74 on 10/14/13. Patient is on Synthroid at home. -Continue Synthroid -Check TSH  HLD: LDL was 59 on 10/14/13. Patient is on Lipitor at home. -Continue Lipitor  COPD: as #1    DVT ppx: SCD     Code Status: Full code Family Communication: None at bed side.      Disposition Plan: Admit to inpatient   Date of Service 08/03/2014    Ivor Costa [facility] Pager [phone]  If 7PM-7AM, please contact night-coverage [email or web address] Password [facility] 08/03/2014, 11:56 PM

## 2014-08-03 NOTE — ED Provider Notes (Signed)
CSN: [id number]     Arrival date & time 08/03/14  1730 History   First MD Initiated Contact with Patient 08/03/14 1756     Chief Complaint  Patient presents with  . Altered Mental Status     (Consider location/radiation/quality/duration/timing/severity/associated sxs/prior Treatment) HPI   73yF sent from dialysis after noted to be very slow to respond. Apparently arrived this way. Not clear if or how long she may have been dialyzed. Friend is at bedside. Reports that she talked to pt yesterday and that her voice sounded hoarse but that she did not seem confused. Apparently pt has been complaining of cough and SOB for the past couple days. Is a smoker. Not on home o2. Pt denies pain anywhere. Can't get much additional history from her though as she is so drowsy.   Past Medical History  Diagnosis Date  . HYPOTHYROIDISM 02/17/2007    s/p surgical removal of goiter in 1997  . DIABETES MELLITUS, TYPE II 02/17/2007  . HYPERLIPIDEMIA 02/17/2007  . GOUT 05/29/2007  . ANEMIA-NOS 05/29/2007  . ANXIETY 03/23/2010  . 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 DEPRESSION 02/17/2007  . RESTLESS LEG SYNDROME 05/29/2007  . COMMON MIGRAINE 05/29/2007  . PERIPHERAL NEUROPATHY 05/29/2007  . HYPERTENSION 02/17/2007  . PERIPHERAL VASCULAR DISEASE 02/17/2007  . PEPTIC ULCER DISEASE 05/29/2007  . ESRD on hemodialysis 02/17/2007    Started dialysis April 2014.  Gets HD at [facility] on TTS schedule.  Cause of ESRD was HTN.     . OSTEOPENIA 09/22/2009  . SEIZURE DISORDER 02/17/2007  . Memory loss 01/24/2010  . Personal history of colonic polyps 11/16/2009  . Palpitations 09/08/2010  . Other diseases of vocal cords 1996  . HEART MURMUR, HX OF 10/02/2010  . 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 Chronic neck pain 12/13/2010  . GERD (gastroesophageal reflux disease)   . 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 Cancer of kidney 10/07/2012    Followed per Dr Despina Pole, MD, urology, [place]   . Arthritis   . Critical lower limb ischemia    Past Surgical History  Procedure Laterality Date  . Left toe amputated  2006  . Bunionectomy  1980  . Goiter removal  1997  . Stress cardiolite  06/18/2006  . Tranthoracic echocardiogram  06/18/2006  . Electrocardiogram  05/29/2006  . Cholecystectomy    . Rotator cuff repair  Left    Dr. Sharol Given  . Av fistula placement  03/13/2012    Procedure: ARTERIOVENOUS (AV) FISTULA CREATION;  Surgeon: Conrad La Habra, MD;  Location: [place];  Service: Vascular;  Laterality: Right;  . Eye surgery      bilateral cataract removal  . Esophagoscopy w/ botox injection  07/22/2012    Procedure: ESOPHAGOSCOPY WITH BOTOX INJECTION;  Surgeon: Rozetta Nunnery, MD;  Location: [place];  Service: ENT;  Laterality: N/A;  esophageal dilation  . Insertion of dialysis catheter N/A 02/05/2013    Procedure: INSERTION OF DIALYSIS CATHETER;  Surgeon: Angelia Mould, MD;  Location: [place];  Service: Vascular;  Laterality: N/A;  Ultrasound guided  . Toe amputation Right Aug. 2015    2nd    Family History  Problem Relation Age of Onset  . Dementia Mother   . Hypertension Mother   . Coronary artery disease Other   . Hyperlipidemia Other   . Hypertension Other   . Ovarian cancer Other   . Stroke Other   .  Hypertension Sister   . Hypertension Brother   . Heart attack Brother   . Stroke Brother    History  Substance Use Topics  . Smoking status: Former Smoker -- 0.25 packs/day for 47 years    Types: Cigarettes  . Smokeless tobacco: Never Used     Comment: USING E CIG  . Alcohol Use: No   OB History    No data available     Review of Systems  Level 5 caveat because pt is encephalopathic.   Allergies  Cephalosporins; Nsaids; Pioglitazone; and Vancomycin  Home Medications   Prior to Admission medications   Medication Sig Start Date End Date Taking? Authorizing Provider  acetaminophen (TYLENOL) 325 MG tablet Take 2 tablets  (650 mg total) by mouth every 6 (six) hours as needed for mild pain (or Fever >/= 101). 04/05/14   Delfina Redwood, MD  albuterol (PROVENTIL HFA;VENTOLIN HFA) 108 (90 BASE) MCG/ACT inhaler Inhale 2 puffs into the lungs every 6 (six) hours as needed for wheezing or shortness of breath. 06/15/14   Biagio Borg, MD  allopurinol (ZYLOPRIM) 100 MG tablet Take 100 mg by mouth daily.  01/04/12   Historical Provider, MD  amLODipine (NORVASC) 5 MG tablet Take 1 tablet (5 mg total) by mouth daily. 04/12/14   Gerlene Fee, NP  aspirin EC 81 MG tablet Take 81 mg by mouth daily.    Historical Provider, MD  atorvastatin (LIPITOR) 40 MG tablet TAKE ONE TABLET BY MOUTH ONCE DAILY 05/10/14   Hendricks Limes, MD  benzonatate (TESSALON) 100 MG capsule TAKE ONE TO TWO CAPSULES BY MOUTH EVERY 6 HOURS AS NEEDED FOR COUGH 06/14/14   Biagio Borg, MD  cinacalcet (SENSIPAR) 30 MG tablet Take 1 tablet (30 mg total) by mouth daily. 04/12/14   Gerlene Fee, NP  clonazePAM [facility] [place]) 2 MG tablet Take one tablet by mouth twice daily as needed for anxiety 04/20/14   Estill Dooms, MD  colchicine 0.6 MG tablet Take 0.6 mg by mouth daily as needed.    Historical Provider, MD  cyclobenzaprine (FLEXERIL) 10 MG tablet Take 1 tablet (10 mg total) by mouth 3 (three) times daily as needed for muscle spasms. 07/13/14   Biagio Borg, MD  darbepoetin [facility]) 25 MCG/0.42ML SOLN injection Inject 25 mcg into the vein every 7 (seven) days. Every Thursday at dialysis    Historical Provider, MD  docusate sodium (COLACE) 100 MG capsule Take 100 mg by mouth 2 (two) times daily as needed for mild constipation.     Historical Provider, MD  doxercalciferol (HECTOROL) 4 MCG/2ML injection Inject 4 mcg into the vein Every Tuesday,Thursday,and Saturday with dialysis.    Historical Provider, MD  DULoxetine (CYMBALTA) 30 MG capsule Take 2 capsules (60 mg total) by mouth daily. 06/15/14   Biagio Borg, MD  HYDROcodone-acetaminophen (NORCO/VICODIN) 5-325 MG  per tablet Take 1 tablet by mouth every 6 (six) hours as needed for severe pain. 04/20/14   Estill Dooms, MD  hydrOXYzine (ATARAX/VISTARIL) 25 MG tablet TAKE ONE TABLET BY MOUTH EVERY 6 HOURS AS NEEDED 06/14/14   Biagio Borg, MD  hydrOXYzine (ATARAX/VISTARIL) 25 MG tablet Take 1 tablet (25 mg total) by mouth every 6 (six) hours as needed for anxiety. 06/15/14   Biagio Borg, MD  lactulose (CHRONULAC) 10 GM/15ML solution Take 20 g by mouth daily.    Historical Provider, MD  levothyroxine (SYNTHROID, LEVOTHROID) 125 MCG tablet Take 125 mcg by mouth daily before  breakfast.    Historical Provider, MD  Melatonin 3 MG TABS Take 3 mg by mouth at bedtime as needed.    Historical Provider, MD  mometasone-formoterol (DULERA) 100-5 MCG/ACT AERO Inhale 2 puffs into the lungs 2 (two) times daily. 03/24/14   Biagio Borg, MD  montelukast (SINGULAIR) 10 MG tablet Take 10 mg by mouth at bedtime.    Historical Provider, MD  multivitamin (RENA-VIT) TABS tablet Take 1 tablet by mouth daily.    Historical Provider, MD  omeprazole (PRILOSEC) 40 MG capsule Take 1 capsule (40 mg total) by mouth daily. 07/13/14   Biagio Borg, MD  ondansetron (ZOFRAN) 4 MG tablet Take 4 mg by mouth every 6 (six) hours as needed for nausea or vomiting.    Historical Provider, MD  polyethylene glycol (MIRALAX / GLYCOLAX) packet Take 17 g by mouth daily.    Historical Provider, MD  predniSONE (DELTASONE) 50 MG tablet Take 1 tablet (50 mg total) by mouth daily with breakfast. 05/20/14   Liam Graham, PA-C  QUEtiapine (SEROQUEL) 100 MG tablet Take 1 tablet (100 mg total) by mouth at bedtime. 04/05/14   Delfina Redwood, MD  sevelamer carbonate (RENVELA) 800 MG tablet Take 2,400 mg by mouth 3 (three) times daily with meals.    Historical Provider, MD   BP 90/74 mmHg  Pulse 78  Temp(Src) 98.8 F (37.1 C) (Oral)  Resp 12  Ht 5' 7.5" (1.715 m)  Wt 170 lb (77.111 kg)  BMI 26.22 kg/m2  SpO2 90% Physical Exam  Constitutional: She appears  well-developed and well-nourished.  Laying in bed. Appears to be sleeping.   HENT:  Head: Normocephalic and atraumatic.  Eyes: Conjunctivae are normal. Right eye exhibits no discharge. Left eye exhibits no discharge.  Neck: Neck supple.  Cardiovascular: Normal rate, regular rhythm and normal heart sounds.  Exam reveals no gallop and no friction rub.   No murmur heard. AV fistula RUE  Pulmonary/Chest: Effort normal. No respiratory distress. She has wheezes.  B/l wheezing.   Abdominal: Soft. She exhibits no distension. There is no tenderness.  Musculoskeletal: She exhibits no edema or tenderness.  Neurological:  Able to give correct month but thinks Saturday and 1980. Very drowsy. Does open eyes with a lot of encouragement. Will squeeze fingers and move feet on command. Globally weak/slow but seems to be moving extremities equally. No facial droop.   Skin: Skin is warm and dry.  Nursing note and vitals reviewed.   ED Course  Procedures (including critical care time) Labs Review Labs Reviewed  CBC - Abnormal; Notable for the following:    RBC 3.59 (*)    Hemoglobin 11.3 (*)    HCT 34.3 (*)    RDW 16.2 (*)    Platelets 37 (*)    All other components within normal limits  COMPREHENSIVE METABOLIC PANEL - Abnormal; Notable for the following:    BUN 31 (*)    Creatinine, Ser 7.28 (*)    Calcium 8.2 (*)    Albumin 3.4 (*)    AST 38 (*)    GFR calc non Af Amer 5 (*)    GFR calc Af Amer 6 (*)    All other components within normal limits  BRAIN NATRIURETIC PEPTIDE - Abnormal; Notable for the following:    B Natriuretic Peptide 223.3 (*)    All other components within normal limits  LACTIC ACID, PLASMA - Abnormal; Notable for the following:    Lactic Acid, Venous 2.6 (*)  All other components within normal limits  I-STAT ARTERIAL BLOOD GAS, ED - Abnormal; Notable for the following:    pH, Arterial 7.338 (*)    pCO2 arterial 55.6 (*)    pO2, Arterial 78.0 (*)    Bicarbonate 29.8  (*)    Acid-Base Excess 3.0 (*)    All other components within normal limits  CULTURE, BLOOD (ROUTINE X 2)  CULTURE, BLOOD (ROUTINE X 2)  MAGNESIUM  TROPONIN I  APTT  PROTIME-INR  BLOOD GAS, ARTERIAL  LACTATE DEHYDROGENASE  CBG MONITORING, ED  I-STAT TROPOININ, ED    Imaging Review Dg Chest Portable 1 View  08/03/2014   CLINICAL DATA:  Acute onset of altered mental status. Cough and shortness of breath. Hypoxemia.  EXAM: PORTABLE CHEST - 1 VIEW  COMPARISON:  Chest x-ray dated 05/20/2014 and 03/30/2014  FINDINGS: The heart size and pulmonary vascularity are normal. There is tortuosity and calcification of the thoracic aorta.  The patient has bibasilar atelectasis with a shallow inspiration. No effusions. Moderate thoracolumbar scoliosis. Fairly severe arthritis the left shoulder. No acute osseous abnormality.  IMPRESSION: Bibasilar atelectasis, right greater than left. At least some of this is due to a shallow inspiration.   Electronically Signed   By: Rozetta Nunnery M.D.   On: 08/03/2014 19:42     EKG Interpretation None      MDM   Final diagnoses:  Hypoxemia  Altered mental status  Thrombocytopenia    73yF with AMS/hypoxemia. Wheezing on exam. Long smoking hx. Not on home o2. ABG as above. Nebs and steroids.  CXR not all that impressive, but will tx for possible infection, particularly with unreliable ROS at this time and hypotension. Does not appear volume overloaded. Had dialysis today although not clear if completed session. Lytes ok. She denies pain anywhere. Troponin normal. Afebrile orally. Will check rectal temp. Thrombocytopenia noted. No overt bleeding. Likely multifactorial in dialysis patient. Will CT head. Needs admit.     Virgel Manifold, MD 08/04/14 [phone]

## 2014-08-03 NOTE — ED Notes (Signed)
Placed pt on 2 liters of oxygen nasal cannula due to low oxygen saturations

## 2014-08-03 NOTE — Progress Notes (Addendum)
ANTIBIOTIC CONSULT NOTE - INITIAL  Pharmacy Consult for Vancomycin Indication: rule out pneumonia  Allergies  Allergen Reactions  . Cephalosporins Itching and Rash    Vanc and fortaz given at the same time in June for several doses at dialysis with systemic rash and itching; received zinacef 7/5 and had worseningsystemicrash/ itching and swelling of eyes - so unclear if allergic to either or both  . Nsaids     REACTION: renal dysfunction  . Pioglitazone     Actos REACTION: EDEMA  . Vancomycin     See comment under cephalosporin    Patient Measurements: Height: 5' 7.5" (171.5 cm) Weight: 170 lb (77.111 kg) (self reported) IBW/kg (Calculated) : 62.75 Adjusted Body Weight: 68.5 kg  Vital Signs: Temp: 98.8 F (37.1 C) (12/29 1740) Temp Source: Oral (12/29 1740) BP: 88/41 mmHg (12/29 1915) Pulse Rate: 79 (12/29 1915) Intake/Output from previous day:   Intake/Output from this shift:    Labs:  Recent Labs  08/03/14 1848  WBC 7.5  HGB 11.3*  PLT PENDING  CREATININE 7.28*   CrCl cannot be calculated (Unknown ideal weight.). No results for input(s): VANCOTROUGH, VANCOPEAK, VANCORANDOM, GENTTROUGH, GENTPEAK, GENTRANDOM, TOBRATROUGH, TOBRAPEAK, TOBRARND, AMIKACINPEAK, AMIKACINTROU, AMIKACIN in the last 72 hours.   Microbiology: No results found for this or any previous visit (from the past 720 hour(s)).  Medical History: Past Medical History  Diagnosis Date  . HYPOTHYROIDISM 02/17/2007    s/p surgical removal of goiter in 1997  . DIABETES MELLITUS, TYPE II 02/17/2007  . HYPERLIPIDEMIA 02/17/2007  . GOUT 05/29/2007  . ANEMIA-NOS 05/29/2007  . ANXIETY 03/23/2010  . 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 DEPRESSION 02/17/2007  . RESTLESS LEG SYNDROME 05/29/2007  . COMMON MIGRAINE 05/29/2007  . PERIPHERAL NEUROPATHY 05/29/2007  . HYPERTENSION 02/17/2007  . PERIPHERAL VASCULAR DISEASE 02/17/2007  . PEPTIC ULCER DISEASE 05/29/2007  . ESRD on hemodialysis 02/17/2007    Started  dialysis April 2014.  Gets HD at [facility] on TTS schedule.  Cause of ESRD was HTN.     . OSTEOPENIA 09/22/2009  . SEIZURE DISORDER 02/17/2007  . Memory loss 01/24/2010  . Personal history of colonic polyps 11/16/2009  . Palpitations 09/08/2010  . Other diseases of vocal cords 1996  . HEART MURMUR, HX OF 10/02/2010  . 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 Chronic neck pain 12/13/2010  . GERD (gastroesophageal reflux disease)   . 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 Cancer of kidney 10/07/2012    Followed per Dr Despina Pole, MD, urology, [place]   . Arthritis   . Critical lower limb ischemia     Medications:   (Not in a hospital admission) Scheduled:   Infusions:  . sodium chloride     Assessment: 73yo female with ESRD on HD TTS presents with AMS along with cough and SOB for last few days. Pharmacy is consulted to dose vancomycin and zosyn for possible pneumonia. Pt is afebrile, WBC wnl. Pt was in dialysis today, but was unable to tolerate her session.  Goal of Therapy:  Vancomycin pre-HD target level 15-25 mcg/mL  Plan:  Vancomycin 1500mg  IV load followed by 750mg  IV qHD Zosyn 2.25g IV q8h Measure antibiotic drug levels at steady state Follow up culture results, renal function, and clinical course   Andrey Cota. Diona Foley, PharmD Clinical Pharmacist Pager [phone] 08/03/2014,7:33 PM

## 2014-08-03 NOTE — ED Notes (Signed)
Pt presents to ED via EMS from dialysis center for report of altered mental status. Dialysis center staff report pt was slugish and slow to response when she got to dialysis center prior to starting dialysis Tx. BP was running in low 90s. Pt reports has been coughing and SOB for few days. Per EMS, BP-93/36, CBG-71.

## 2014-08-04 ENCOUNTER — Other Ambulatory Visit: Payer: Self-pay

## 2014-08-04 DIAGNOSIS — J45909 Unspecified asthma, uncomplicated: Secondary | ICD-10-CM | POA: Diagnosis present

## 2014-08-04 DIAGNOSIS — I10 Essential (primary) hypertension: Secondary | ICD-10-CM | POA: Diagnosis not present

## 2014-08-04 DIAGNOSIS — R4182 Altered mental status, unspecified: Secondary | ICD-10-CM | POA: Diagnosis present

## 2014-08-04 DIAGNOSIS — Z89421 Acquired absence of other right toe(s): Secondary | ICD-10-CM | POA: Diagnosis not present

## 2014-08-04 DIAGNOSIS — I871 Compression of vein: Secondary | ICD-10-CM | POA: Diagnosis present

## 2014-08-04 DIAGNOSIS — Z9049 Acquired absence of other specified parts of digestive tract: Secondary | ICD-10-CM | POA: Diagnosis present

## 2014-08-04 DIAGNOSIS — E785 Hyperlipidemia, unspecified: Secondary | ICD-10-CM | POA: Diagnosis present

## 2014-08-04 DIAGNOSIS — Z89422 Acquired absence of other left toe(s): Secondary | ICD-10-CM | POA: Diagnosis not present

## 2014-08-04 DIAGNOSIS — M1990 Unspecified osteoarthritis, unspecified site: Secondary | ICD-10-CM | POA: Diagnosis present

## 2014-08-04 DIAGNOSIS — Z87891 Personal history of nicotine dependence: Secondary | ICD-10-CM | POA: Diagnosis not present

## 2014-08-04 DIAGNOSIS — I120 Hypertensive chronic kidney disease with stage 5 chronic kidney disease or end stage renal disease: Secondary | ICD-10-CM | POA: Diagnosis present

## 2014-08-04 DIAGNOSIS — N390 Urinary tract infection, site not specified: Secondary | ICD-10-CM | POA: Diagnosis present

## 2014-08-04 DIAGNOSIS — E039 Hypothyroidism, unspecified: Secondary | ICD-10-CM | POA: Diagnosis present

## 2014-08-04 DIAGNOSIS — J441 Chronic obstructive pulmonary disease with (acute) exacerbation: Secondary | ICD-10-CM | POA: Diagnosis present

## 2014-08-04 DIAGNOSIS — D649 Anemia, unspecified: Secondary | ICD-10-CM | POA: Diagnosis present

## 2014-08-04 DIAGNOSIS — G2581 Restless legs syndrome: Secondary | ICD-10-CM | POA: Diagnosis present

## 2014-08-04 DIAGNOSIS — Z888 Allergy status to other drugs, medicaments and biological substances status: Secondary | ICD-10-CM | POA: Diagnosis not present

## 2014-08-04 DIAGNOSIS — D696 Thrombocytopenia, unspecified: Secondary | ICD-10-CM | POA: Diagnosis not present

## 2014-08-04 DIAGNOSIS — Z7982 Long term (current) use of aspirin: Secondary | ICD-10-CM | POA: Diagnosis not present

## 2014-08-04 DIAGNOSIS — D6959 Other secondary thrombocytopenia: Secondary | ICD-10-CM | POA: Diagnosis present

## 2014-08-04 DIAGNOSIS — I739 Peripheral vascular disease, unspecified: Secondary | ICD-10-CM | POA: Diagnosis present

## 2014-08-04 DIAGNOSIS — G43009 Migraine without aura, not intractable, without status migrainosus: Secondary | ICD-10-CM | POA: Diagnosis present

## 2014-08-04 DIAGNOSIS — Z8711 Personal history of peptic ulcer disease: Secondary | ICD-10-CM | POA: Diagnosis not present

## 2014-08-04 DIAGNOSIS — Z86010 Personal history of colonic polyps: Secondary | ICD-10-CM | POA: Diagnosis not present

## 2014-08-04 DIAGNOSIS — J449 Chronic obstructive pulmonary disease, unspecified: Secondary | ICD-10-CM | POA: Diagnosis not present

## 2014-08-04 DIAGNOSIS — Z7952 Long term (current) use of systemic steroids: Secondary | ICD-10-CM | POA: Diagnosis not present

## 2014-08-04 DIAGNOSIS — G9340 Encephalopathy, unspecified: Secondary | ICD-10-CM | POA: Diagnosis present

## 2014-08-04 DIAGNOSIS — M109 Gout, unspecified: Secondary | ICD-10-CM | POA: Diagnosis present

## 2014-08-04 DIAGNOSIS — G40909 Epilepsy, unspecified, not intractable, without status epilepticus: Secondary | ICD-10-CM | POA: Diagnosis present

## 2014-08-04 DIAGNOSIS — Z992 Dependence on renal dialysis: Secondary | ICD-10-CM | POA: Diagnosis not present

## 2014-08-04 DIAGNOSIS — Z881 Allergy status to other antibiotic agents status: Secondary | ICD-10-CM | POA: Diagnosis not present

## 2014-08-04 DIAGNOSIS — N186 End stage renal disease: Secondary | ICD-10-CM | POA: Diagnosis present

## 2014-08-04 DIAGNOSIS — K219 Gastro-esophageal reflux disease without esophagitis: Secondary | ICD-10-CM | POA: Diagnosis present

## 2014-08-04 DIAGNOSIS — E119 Type 2 diabetes mellitus without complications: Secondary | ICD-10-CM | POA: Diagnosis present

## 2014-08-04 DIAGNOSIS — E875 Hyperkalemia: Secondary | ICD-10-CM | POA: Diagnosis present

## 2014-08-04 LAB — URINALYSIS, ROUTINE W REFLEX MICROSCOPIC
Glucose, UA: NEGATIVE mg/dL
KETONES UR: 15 mg/dL — AB
Nitrite: NEGATIVE
Protein, ur: >300 mg/dL — AB
Specific Gravity, Urine: 1.017 (ref 1.005–1.030)
UROBILINOGEN UA: 0.2 mg/dL (ref 0.0–1.0)
pH: 6 (ref 5.0–8.0)

## 2014-08-04 LAB — BASIC METABOLIC PANEL
Anion gap: 13 (ref 5–15)
BUN: 37 mg/dL — AB (ref 6–23)
CO2: 24 mmol/L (ref 19–32)
Calcium: 8.1 mg/dL — ABNORMAL LOW (ref 8.4–10.5)
Chloride: 99 mEq/L (ref 96–112)
Creatinine, Ser: 7.37 mg/dL — ABNORMAL HIGH (ref 0.50–1.10)
GFR calc non Af Amer: 5 mL/min — ABNORMAL LOW (ref >90)
GFR, EST AFRICAN AMERICAN: 6 mL/min — AB (ref >90)
Glucose, Bld: 138 mg/dL — ABNORMAL HIGH (ref 70–99)
Potassium: 6.3 mmol/L (ref 3.5–5.1)
Sodium: 136 mmol/L (ref 135–145)

## 2014-08-04 LAB — GLUCOSE, CAPILLARY: GLUCOSE-CAPILLARY: 120 mg/dL — AB (ref 70–99)

## 2014-08-04 LAB — SAVE SMEAR

## 2014-08-04 LAB — APTT: APTT: 38 s — AB (ref 24–37)

## 2014-08-04 LAB — LACTATE DEHYDROGENASE: LDH: 175 U/L (ref 94–250)

## 2014-08-04 LAB — URINE MICROSCOPIC-ADD ON

## 2014-08-04 LAB — CBC
HCT: 27.8 % — ABNORMAL LOW (ref 36.0–46.0)
Hemoglobin: 8.9 g/dL — ABNORMAL LOW (ref 12.0–15.0)
MCH: 29.9 pg (ref 26.0–34.0)
MCHC: 32 g/dL (ref 30.0–36.0)
MCV: 93.3 fL (ref 78.0–100.0)
Platelets: 181 10*3/uL (ref 150–400)
RBC: 2.98 MIL/uL — ABNORMAL LOW (ref 3.87–5.11)
RDW: 16.4 % — ABNORMAL HIGH (ref 11.5–15.5)
WBC: 9.6 10*3/uL (ref 4.0–10.5)

## 2014-08-04 LAB — HEPATITIS B SURFACE ANTIGEN: Hepatitis B Surface Ag: NEGATIVE

## 2014-08-04 LAB — HAPTOGLOBIN: HAPTOGLOBIN: 160 mg/dL (ref 45–215)

## 2014-08-04 LAB — TSH: TSH: 10.537 u[IU]/mL — AB (ref 0.350–4.500)

## 2014-08-04 LAB — MRSA PCR SCREENING: MRSA by PCR: POSITIVE — AB

## 2014-08-04 MED ORDER — DOXERCALCIFEROL 4 MCG/2ML IV SOLN
4.0000 ug | INTRAVENOUS | Status: DC
  Filled 2014-08-04: qty 2

## 2014-08-04 MED ORDER — HEPARIN SODIUM (PORCINE) 5000 UNIT/ML IJ SOLN
5000.0000 [IU] | Freq: Three times a day (TID) | INTRAMUSCULAR | Status: DC
  Administered 2014-08-04 – 2014-08-05 (×3): 5000 [IU] via SUBCUTANEOUS
  Filled 2014-08-04 (×7): qty 1

## 2014-08-04 MED ORDER — SODIUM CHLORIDE 0.9 % IV SOLN
INTRAVENOUS | Status: AC
  Administered 2014-08-04: 03:00:00 via INTRAVENOUS

## 2014-08-04 MED ORDER — LEVOTHYROXINE SODIUM 150 MCG PO TABS
150.0000 ug | ORAL_TABLET | Freq: Every day | ORAL | Status: DC
  Administered 2014-08-05: 150 ug via ORAL
  Filled 2014-08-04 (×3): qty 1

## 2014-08-04 MED ORDER — HEPARIN SODIUM (PORCINE) 1000 UNIT/ML DIALYSIS
2000.0000 [IU] | Freq: Once | INTRAMUSCULAR | Status: AC
  Administered 2014-08-04: 2000 [IU] via INTRAVENOUS_CENTRAL

## 2014-08-04 MED ORDER — QUETIAPINE FUMARATE 100 MG PO TABS
100.0000 mg | ORAL_TABLET | Freq: Every day | ORAL | Status: DC
  Administered 2014-08-04 (×2): 100 mg via ORAL
  Filled 2014-08-04 (×3): qty 1

## 2014-08-04 MED ORDER — ATORVASTATIN CALCIUM 40 MG PO TABS
40.0000 mg | ORAL_TABLET | Freq: Every day | ORAL | Status: DC
  Administered 2014-08-04 – 2014-08-05 (×2): 40 mg via ORAL
  Filled 2014-08-04 (×2): qty 1

## 2014-08-04 MED ORDER — LACTULOSE 10 GM/15ML PO SOLN
20.0000 g | Freq: Every day | ORAL | Status: DC
  Administered 2014-08-04 – 2014-08-05 (×2): 20 g via ORAL
  Filled 2014-08-04 (×2): qty 30

## 2014-08-04 MED ORDER — IPRATROPIUM-ALBUTEROL 0.5-2.5 (3) MG/3ML IN SOLN
3.0000 mL | RESPIRATORY_TRACT | Status: DC
  Administered 2014-08-04 (×3): 3 mL via RESPIRATORY_TRACT
  Filled 2014-08-04 (×3): qty 3

## 2014-08-04 MED ORDER — LEVOFLOXACIN IN D5W 500 MG/100ML IV SOLN
500.0000 mg | INTRAVENOUS | Status: DC
  Filled 2014-08-04: qty 100

## 2014-08-04 MED ORDER — SODIUM CHLORIDE 0.9 % IV SOLN: 100.0000 mL | INTRAVENOUS | Status: DC | PRN

## 2014-08-04 MED ORDER — HEPARIN SODIUM (PORCINE) 1000 UNIT/ML DIALYSIS: 1000.0000 [IU] | INTRAMUSCULAR | Status: DC | PRN

## 2014-08-04 MED ORDER — ALBUTEROL SULFATE (2.5 MG/3ML) 0.083% IN NEBU: 2.5000 mg | INHALATION_SOLUTION | RESPIRATORY_TRACT | Status: DC | PRN

## 2014-08-04 MED ORDER — HYDROCODONE-ACETAMINOPHEN 5-325 MG PO TABS
1.0000 | ORAL_TABLET | Freq: Four times a day (QID) | ORAL | Status: DC | PRN
  Administered 2014-08-04 – 2014-08-05 (×3): 1 via ORAL
  Filled 2014-08-04 (×3): qty 1

## 2014-08-04 MED ORDER — LIDOCAINE-PRILOCAINE 2.5-2.5 % EX CREA
1.0000 "application " | TOPICAL_CREAM | CUTANEOUS | Status: DC | PRN
  Filled 2014-08-04: qty 5

## 2014-08-04 MED ORDER — NEPRO/CARBSTEADY PO LIQD
237.0000 mL | ORAL | Status: DC | PRN
  Filled 2014-08-04: qty 237

## 2014-08-04 MED ORDER — PENTAFLUOROPROP-TETRAFLUOROETH EX AERO: 1.0000 "application " | INHALATION_SPRAY | CUTANEOUS | Status: DC | PRN

## 2014-08-04 MED ORDER — LEVOTHYROXINE SODIUM 125 MCG PO TABS
125.0000 ug | ORAL_TABLET | Freq: Every day | ORAL | Status: DC
  Administered 2014-08-04: 125 ug via ORAL
  Filled 2014-08-04 (×2): qty 1

## 2014-08-04 MED ORDER — SODIUM CHLORIDE 0.9 % IJ SOLN
3.0000 mL | Freq: Two times a day (BID) | INTRAMUSCULAR | Status: DC
  Administered 2014-08-04: 3 mL via INTRAVENOUS

## 2014-08-04 MED ORDER — PREDNISONE 50 MG PO TABS
50.0000 mg | ORAL_TABLET | Freq: Every day | ORAL | Status: DC
  Administered 2014-08-04: 50 mg via ORAL
  Filled 2014-08-04 (×2): qty 1

## 2014-08-04 MED ORDER — SEVELAMER CARBONATE 800 MG PO TABS
2400.0000 mg | ORAL_TABLET | Freq: Three times a day (TID) | ORAL | Status: DC
  Administered 2014-08-04 – 2014-08-05 (×3): 2400 mg via ORAL
  Filled 2014-08-04 (×7): qty 3

## 2014-08-04 MED ORDER — CHLORHEXIDINE GLUCONATE CLOTH 2 % EX PADS
6.0000 | MEDICATED_PAD | Freq: Every day | CUTANEOUS | Status: DC
  Administered 2014-08-05: 6 via TOPICAL

## 2014-08-04 MED ORDER — CYCLOBENZAPRINE HCL 10 MG PO TABS: 10.0000 mg | ORAL_TABLET | Freq: Three times a day (TID) | ORAL | Status: DC | PRN

## 2014-08-04 MED ORDER — DOCUSATE SODIUM 100 MG PO CAPS: 100.0000 mg | ORAL_CAPSULE | Freq: Two times a day (BID) | ORAL | Status: DC | PRN

## 2014-08-04 MED ORDER — ALLOPURINOL 100 MG PO TABS
100.0000 mg | ORAL_TABLET | Freq: Every day | ORAL | Status: DC
  Administered 2014-08-04 – 2014-08-05 (×2): 100 mg via ORAL
  Filled 2014-08-04 (×2): qty 1

## 2014-08-04 MED ORDER — ONDANSETRON HCL 4 MG PO TABS: 4.0000 mg | ORAL_TABLET | Freq: Four times a day (QID) | ORAL | Status: DC | PRN

## 2014-08-04 MED ORDER — ASPIRIN EC 81 MG PO TBEC
81.0000 mg | DELAYED_RELEASE_TABLET | Freq: Every day | ORAL | Status: DC
  Administered 2014-08-04 – 2014-08-05 (×2): 81 mg via ORAL
  Filled 2014-08-04 (×2): qty 1

## 2014-08-04 MED ORDER — LEVOFLOXACIN IN D5W 750 MG/150ML IV SOLN
750.0000 mg | Freq: Once | INTRAVENOUS | Status: AC
  Administered 2014-08-04: 750 mg via INTRAVENOUS
  Filled 2014-08-04: qty 150

## 2014-08-04 MED ORDER — HYDROXYZINE HCL 10 MG PO TABS
10.0000 mg | ORAL_TABLET | Freq: Three times a day (TID) | ORAL | Status: DC | PRN
  Filled 2014-08-04: qty 1

## 2014-08-04 MED ORDER — CLONAZEPAM 0.5 MG PO TABS
0.2500 mg | ORAL_TABLET | Freq: Two times a day (BID) | ORAL | Status: DC | PRN
  Administered 2014-08-04 – 2014-08-05 (×2): 0.25 mg via ORAL
  Filled 2014-08-04 (×2): qty 1

## 2014-08-04 MED ORDER — RENA-VITE PO TABS
1.0000 | ORAL_TABLET | Freq: Every day | ORAL | Status: DC
  Administered 2014-08-04 – 2014-08-05 (×2): 1 via ORAL
  Filled 2014-08-04 (×3): qty 1

## 2014-08-04 MED ORDER — DOXERCALCIFEROL 4 MCG/2ML IV SOLN
6.0000 ug | INTRAVENOUS | Status: DC
  Administered 2014-08-05: 6 ug via INTRAVENOUS
  Filled 2014-08-04: qty 4

## 2014-08-04 MED ORDER — IPRATROPIUM-ALBUTEROL 0.5-2.5 (3) MG/3ML IN SOLN
3.0000 mL | Freq: Three times a day (TID) | RESPIRATORY_TRACT | Status: DC
  Administered 2014-08-04: 3 mL via RESPIRATORY_TRACT
  Filled 2014-08-04 (×2): qty 3

## 2014-08-04 MED ORDER — NICOTINE 14 MG/24HR TD PT24
14.0000 mg | MEDICATED_PATCH | Freq: Every day | TRANSDERMAL | Status: DC
  Administered 2014-08-04 – 2014-08-05 (×3): 14 mg via TRANSDERMAL
  Filled 2014-08-04 (×3): qty 1

## 2014-08-04 MED ORDER — ALTEPLASE 2 MG IJ SOLR
2.0000 mg | Freq: Once | INTRAMUSCULAR | Status: DC | PRN
Start: 2014-08-04 — End: 2014-08-04
  Filled 2014-08-04: qty 2

## 2014-08-04 MED ORDER — PANTOPRAZOLE SODIUM 40 MG PO TBEC
40.0000 mg | DELAYED_RELEASE_TABLET | Freq: Every day | ORAL | Status: DC
  Administered 2014-08-04 – 2014-08-05 (×2): 40 mg via ORAL
  Filled 2014-08-04 (×2): qty 1

## 2014-08-04 MED ORDER — CINACALCET HCL 30 MG PO TABS
30.0000 mg | ORAL_TABLET | Freq: Every day | ORAL | Status: DC
  Administered 2014-08-04 – 2014-08-05 (×2): 30 mg via ORAL
  Filled 2014-08-04 (×2): qty 1

## 2014-08-04 MED ORDER — BENZONATATE 100 MG PO CAPS: 100.0000 mg | ORAL_CAPSULE | Freq: Three times a day (TID) | ORAL | Status: DC | PRN

## 2014-08-04 MED ORDER — MELATONIN 3 MG PO TABS: 3.0000 mg | ORAL_TABLET | Freq: Every evening | ORAL | Status: DC | PRN

## 2014-08-04 MED ORDER — ACETAMINOPHEN 325 MG PO TABS: 650.0000 mg | ORAL_TABLET | Freq: Four times a day (QID) | ORAL | Status: DC | PRN

## 2014-08-04 MED ORDER — POLYETHYLENE GLYCOL 3350 17 G PO PACK
17.0000 g | PACK | Freq: Every day | ORAL | Status: DC
  Administered 2014-08-04 – 2014-08-05 (×2): 17 g via ORAL
  Filled 2014-08-04 (×2): qty 1

## 2014-08-04 MED ORDER — MUPIROCIN 2 % EX OINT
1.0000 "application " | TOPICAL_OINTMENT | Freq: Two times a day (BID) | CUTANEOUS | Status: DC
  Administered 2014-08-04 – 2014-08-05 (×2): 1 via NASAL
  Filled 2014-08-04: qty 22

## 2014-08-04 MED ORDER — DULOXETINE HCL 60 MG PO CPEP
60.0000 mg | ORAL_CAPSULE | Freq: Every day | ORAL | Status: DC
Start: 2014-08-04 — End: 2014-08-05
  Administered 2014-08-04 – 2014-08-05 (×2): 60 mg via ORAL
  Filled 2014-08-04 (×2): qty 1

## 2014-08-04 MED ORDER — MONTELUKAST SODIUM 10 MG PO TABS
10.0000 mg | ORAL_TABLET | Freq: Every day | ORAL | Status: DC
  Administered 2014-08-04 (×2): 10 mg via ORAL
  Filled 2014-08-04 (×3): qty 1

## 2014-08-04 MED ORDER — LIDOCAINE HCL (PF) 1 % IJ SOLN: 5.0000 mL | INTRAMUSCULAR | Status: DC | PRN

## 2014-08-04 MED ORDER — COLCHICINE 0.6 MG PO TABS
0.6000 mg | ORAL_TABLET | Freq: Every day | ORAL | Status: DC | PRN
  Filled 2014-08-04: qty 1

## 2014-08-04 NOTE — Evaluation (Signed)
Physical Therapy Evaluation Patient Details Name: [name] [name] MRN: [id number] DOB: [birth year] Today's Date: 08/04/2014   History of Present Illness  Pt is a 73 y/o female who went to dialysis center for HD today. She was noticed by the staff to be slugish and slow to respond when she got to dialysis center prior to starting dialysis. She also reports having cough with brown-yellow colored sputum production.  Clinical Impression  Pt admitted with above diagnosis. Pt currently with functional limitations due to the deficits listed below (see PT Problem List). At the time of PT eval pt was able to perform transfers and ambulation with supervision and occasional min assist during balance activity.   Pt will benefit from skilled PT to increase their independence and safety with mobility to allow discharge to the venue listed below.  Do not feel pt is safe to be alone at home at this time, and PT recommending 24 hour assistance.      Follow Up Recommendations Home health PT;Supervision/Assistance - 24 hour    Equipment Recommendations  None recommended by PT    Recommendations for Other Services       Precautions / Restrictions Precautions Precautions: Fall Restrictions Weight Bearing Restrictions: No      Mobility  Bed Mobility Overal bed mobility: Modified Independent Bed Mobility: Supine to Sit           General bed mobility comments: Pt was able to transition to/from EOB with no assistance and minimal bed rail use.   Transfers Overall transfer level: Needs assistance Equipment used: None Transfers: Sit to/from Stand Sit to Stand: Min assist         General transfer comment: Steadying assist only to power-up to full standing. Pt stood for ~!0 seconds prior to initiating ambulation.  Ambulation/Gait Ambulation/Gait assistance: Min assist Ambulation Distance (Feet): 250 Feet Assistive device: None Gait Pattern/deviations: Step-through pattern;Decreased stride  length;Trunk flexed;Antalgic Gait velocity: Decreased Gait velocity interpretation: Below normal speed for age/gender General Gait Details: Pt with antalgic gait pattern and appears to have significant arch collapse and pes planus with weight bearing. Pt was not able to tolerate head turns or higher balance activity during gait training without assistance to prevent a fall.   Stairs            Wheelchair Mobility    Modified Rankin (Stroke Patients Only)       Balance Overall balance assessment: Needs assistance Sitting-balance support: Feet supported;No upper extremity supported Sitting balance-Leahy Scale: Good     Standing balance support: No upper extremity supported Standing balance-Leahy Scale: Fair               High level balance activites: Direction changes;Head turns;Other (comment) (Stepping over an object) High Level Balance Comments: Pt required min assist to prevent a fall with stepping over an object. Pt with minimal head motion during head turns as she could not tolerate at this time.              Pertinent Vitals/Pain Pain Assessment: No/denies pain    Home Living Family/patient expects to be discharged to:: Private residence Living Arrangements: Alone Available Help at Discharge: Friend(s);Available PRN/intermittently Type of Home: House Home Access: Stairs to enter Entrance Stairs-Rails: Right Entrance Stairs-Number of Steps: 3 Home Layout: One level Home Equipment: Walker - 2 wheels;Cane - single point;Bedside commode      Prior Function Level of Independence: Independent   Gait / Transfers Assistance Needed: Pt states she was not requiring use of  an AD PTA.            Hand Dominance   Dominant Hand: Right    Extremity/Trunk Assessment   Upper Extremity Assessment: Defer to OT evaluation;LUE deficits/detail       LUE Deficits / Details: Pt reports she had RCR 3 years ago and has had limited mobility ever since.     Lower Extremity Assessment: Generalized weakness (Grossly 4/5 bilaterally)      Cervical / Trunk Assessment: Normal  Communication   Communication: No difficulties  Cognition Arousal/Alertness: Awake/alert Behavior During Therapy: WFL for tasks assessed/performed Overall Cognitive Status: No family/caregiver present to determine baseline cognitive functioning                      General Comments      Exercises        Assessment/Plan    PT Assessment Patient needs continued PT services  PT Diagnosis Difficulty walking;Generalized weakness   PT Problem List Decreased strength;Decreased range of motion;Decreased activity tolerance;Decreased balance;Decreased mobility;Decreased knowledge of use of DME;Decreased safety awareness;Decreased knowledge of precautions  PT Treatment Interventions DME instruction;Gait training;Stair training;Functional mobility training;Therapeutic activities;Therapeutic exercise;Neuromuscular re-education;Patient/family education   PT Goals (Current goals can be found in the Care Plan section) Acute Rehab PT Goals Patient Stated Goal: Return home PT Goal Formulation: With patient Time For Goal Achievement: 08/11/14 Potential to Achieve Goals: Good    Frequency Min 3X/week   Barriers to discharge        Co-evaluation               End of Session Equipment Utilized During Treatment: Gait belt Activity Tolerance: Patient tolerated treatment well Patient left: in bed;with call bell/phone within reach Nurse Communication: Mobility status         Time: 5621-3086 PT Time Calculation (min) (ACUTE ONLY): 27 min   Charges:   PT Evaluation $Initial PT Evaluation Tier I: 1 Procedure PT Treatments $Gait Training: 8-22 mins $Therapeutic Activity: 8-22 mins   PT G Codes:        Rolinda Roan 2014-09-03, 3:10 PM   Rolinda Roan, PT, DPT Acute Rehabilitation Services Pager: [phone]

## 2014-08-04 NOTE — Progress Notes (Signed)
PHARMACIST - PHYSICIAN ORDER COMMUNICATION  CONCERNING: P&T Medication Policy on Herbal Medications  DESCRIPTION:  This patient's order for:  Melatonin 3mg    has been noted.  This product(s) is classified as an "herbal" or natural product. Due to a lack of definitive safety studies or FDA approval, nonstandard manufacturing practices, plus the potential risk of unknown drug-drug interactions while on inpatient medications, the Pharmacy and Therapeutics Committee does not permit the use of "herbal" or natural products of this type within [facility].   ACTION TAKEN: The pharmacy department is unable to verify this order at this time and your patient has been informed of this safety policy. Please reevaluate patient's clinical condition at discharge and address if the herbal or natural product(s) should be resumed at that time.

## 2014-08-04 NOTE — Progress Notes (Signed)
[facility] PROGRESS NOTE  [name] [name] TMH:[id number] DOB: Aug 14, 1940 DOA: 08/03/2014 PCP: Cathlean Cower, MD  Assessment/Plan: 1. Altered mental status- resolved, patient back to her baseline. Started on empiric antibiotics, Levaquin. Urine culture is pending. Chest x-ray does not show any infiltrate. 2. Thrombocytopenia- ? Lab error, yesterday when patient came off. White count was 37. This morning platelet count is back to 181. Continue to monitor the CBC in the hospital. 3. History of bronchitis- patient recently was given tapering dose of prednisone, she is currently not taking prednisone at home. Continue DuoNeb nebulizers 3 times a day 4. End-stage renal disease- patient is on hemodialysis Tuesday Thursday and Saturday. Her creatinine is 7.28. Potassium 6.3. Nephrology consulted.  dialysis in a.m. 5. Hypothyroidism- continue Synthroid. TSH is elevated 10.537. We'll increase the dose of Synthroid to 150 g by mouth daily.  Code Status: Full code Family Communication: No family at bedside Disposition Plan: Home when stable   Consultants:  None  Procedures:  None  Antibiotics:  Levaquin  HPI/Subjective: 73 y.o. female with PMH of ESRD on HD (T/T/S) , hx of seizures disorder, HTN, Hyperlipidemia, Hypothyroid, Anemia, who presents with AMS.   Patient went to dialysis center for HD today. She was noticed by the staff to be slugish and slow to response when she got to dialysis center prior to starting dialysis. Dialysis center staff reported that pt's BP was running in low 90s. Per EMS, BP-93/36, CBG-71.   Patient feels better today, her mental status is back to baseline.     Objective: Filed Vitals:   08/04/14 1756  BP: 117/63  Pulse: 91  Temp: 98.9 F (37.2 C)  Resp: 15    Intake/Output Summary (Last 24 hours) at 08/04/14 1807 Last data filed at 08/04/14 1756  Gross per 24 hour  Intake    360 ml  Output   1010 ml  Net   -650 ml   Filed Weights   08/04/14  1126 08/04/14 1503 08/04/14 1756  Weight: 74.1 kg (163 lb 5.8 oz) 75.2 kg (165 lb 12.6 oz) 72.9 kg (160 lb 11.5 oz)    Exam:  Physical Exam: Eyes: No icterus, extraocular muscles intact  Lungs: Normal respiratory effort, bilateral clear to auscultation, no crackles or wheezes.  Heart: Regular rate and rhythm, S1 and S2 normal, no murmurs, rubs auscultated Abdomen: BS normoactive,soft,nondistended,non-tender to palpation,no organomegaly Extremities: No pretibial edema, no erythema, no cyanosis, no clubbing Neuro : Alert and oriented to time, place and person, No focal deficits  Data Reviewed: Basic Metabolic Panel:  Recent Labs Lab 08/03/14 1848 08/04/14 0550  NA 137 136  K 4.8 6.3*  CL 98 99  CO2 27 24  GLUCOSE 83 138*  BUN 31* 37*  CREATININE 7.28* 7.37*  CALCIUM 8.2* 8.1*  MG 2.4  --    Liver Function Tests:  Recent Labs Lab 08/03/14 1848  AST 38*  ALT 25  ALKPHOS 94  BILITOT 0.9  PROT 6.7  ALBUMIN 3.4*   No results for input(s): LIPASE, AMYLASE in the last 168 hours. No results for input(s): AMMONIA in the last 168 hours. CBC:  Recent Labs Lab 08/03/14 1848 08/04/14 0550  WBC 7.5 9.6  HGB 11.3* 8.9*  HCT 34.3* 27.8*  MCV 95.5 93.3  PLT 37* 181   Cardiac Enzymes:  Recent Labs Lab 08/03/14 1848  TROPONINI <0.03   BNP (last 3 results) No results for input(s): PROBNP in the last 8760 hours. CBG:  Recent Labs Lab 08/03/14 1824  08/04/14 0759  GLUCAP 76 120*    Recent Results (from the past 240 hour(s))  MRSA PCR Screening     Status: Abnormal   Collection Time: 08/04/14  3:29 AM  Result Value Ref Range Status   MRSA by PCR POSITIVE (A) NEGATIVE Final    Comment:        The GeneXpert MRSA Assay (FDA approved for NASAL specimens only), is one component of a comprehensive MRSA colonization surveillance program. It is not intended to diagnose MRSA infection nor to guide or monitor treatment for MRSA infections. RESULT CALLED TO, READ  BACK BY AND VERIFIED WITH: CALLED TO RN Mickel Baas [facility] [id number] @0504  THANEY      Studies: Ct Head Wo Contrast  08/03/2014   CLINICAL DATA:  Confusion.  EXAM: CT HEAD WITHOUT CONTRAST  TECHNIQUE: Contiguous axial images were obtained from the base of the skull through the vertex without intravenous contrast.  COMPARISON:  03/08/2012  FINDINGS: Skull and Sinuses:No acute fracture destructive process.  Mild inflammatory mucosal thickening in the right ethmoid sinuses.  Orbits: No acute findings.  Bilateral cataract resection.  Brain: No evidence of acute infarction, hemorrhage, hydrocephalus, or mass lesion/mass effect. Generalized cerebral volume loss which is typical for age and not progressed since 2013. No significant ischemic change.  IMPRESSION: Age normal head CT.   Electronically Signed   By: Jorje Guild M.D.   On: 08/03/2014 23:29   Dg Chest Portable 1 View  08/03/2014   CLINICAL DATA:  Acute onset of altered mental status. Cough and shortness of breath. Hypoxemia.  EXAM: PORTABLE CHEST - 1 VIEW  COMPARISON:  Chest x-ray dated 05/20/2014 and 03/30/2014  FINDINGS: The heart size and pulmonary vascularity are normal. There is tortuosity and calcification of the thoracic aorta.  The patient has bibasilar atelectasis with a shallow inspiration. No effusions. Moderate thoracolumbar scoliosis. Fairly severe arthritis the left shoulder. No acute osseous abnormality.  IMPRESSION: Bibasilar atelectasis, right greater than left. At least some of this is due to a shallow inspiration.   Electronically Signed   By: Rozetta Nunnery M.D.   On: 08/03/2014 19:42    Scheduled Meds: . allopurinol  100 mg Oral Daily  . aspirin EC  81 mg Oral Daily  . atorvastatin  40 mg Oral Daily  . cinacalcet  30 mg Oral Daily  . [START ON 08/05/2014] doxercalciferol  4 mcg Intravenous Q T,Th,Sa-HD  . [START ON 08/05/2014] doxercalciferol  6 mcg Intravenous Q T,Th,Sa-HD  . DULoxetine  60 mg Oral Daily  . heparin  5,000  Units Subcutaneous 3 times per day  . ipratropium-albuterol  3 mL Nebulization TID  . lactulose  20 g Oral Daily  . [START ON 08/05/2014] levofloxacin (LEVAQUIN) IV  500 mg Intravenous Q48H  . levothyroxine  125 mcg Oral QAC breakfast  . montelukast  10 mg Oral QHS  . multivitamin  1 tablet Oral Daily  . nicotine  14 mg Transdermal Daily  . pantoprazole  40 mg Oral Daily  . polyethylene glycol  17 g Oral Daily  . predniSONE  50 mg Oral Q breakfast  . QUEtiapine  100 mg Oral QHS  . sevelamer carbonate  2,400 mg Oral TID WC  . sodium chloride  3 mL Intravenous Q12H   Continuous Infusions:   Principal Problem:   Acute encephalopathy Active Problems:   Hypothyroidism   HLD (hyperlipidemia)   Gout   Essential hypertension   Peripheral vascular disease   Asthmatic bronchitis , chronic  GERD   Peptic ulcer   Seizure   Cancer of kidney   COPD (chronic obstructive pulmonary disease)   ESRD on hemodialysis   Thrombocytopenia    Time spent: 25 min    [facility] Pager [phone] If 7PM-7AM, please contact night-coverage at [email or web address], password [facility] 08/04/2014, 6:07 PM  LOS: 1 day

## 2014-08-04 NOTE — Progress Notes (Signed)
Admission note:  Arrival Method: Pt arrived by stretcher from ED with RN Mental Orientation: Alert and oriented x 4 Telemetry: Box 19 Telemetry applied per MD order Assessment: See flowsheets Skin: Skin dry, and intact. Toe amputations- Right great and second toes, left second toe. Blisters on third toe of the left foot, gauze dressing in place.  IV: Left forearm IV, NS going at 72ml/hr. Clean, dry and intact Pain: states no pain at this time Tubes: Telemetry, oxygen, continuous pulse ox, and IV line all secured.  Safety Measures: Fall risk Fall Prevention Safety Plan: Yellow socks placed, bed in lowest position, bed alarm on, call light in reach, all tubes secured.  Admission Screening: Completed  6700 Orientation: Patient has been oriented to the unit, staff and to the room.  Call light within reach, pt stated no needs at this time. Will continue to monitor.

## 2014-08-04 NOTE — Consult Note (Signed)
Renal Service Consult Note [facility]  [name] [name] 08/04/2014 Sol Blazing Requesting Physician:  Dr Darrick Meigs  Reason for Consult:  ESRD pt with AMS HPI: The patient is a 72 y.o. year-old with hx of DM2, HL, gout, tobacco, COPD, RLS, PUD, PAD w hx toe amps, ESRD on HD, seizure d/o, depression/anxiety who presented to ED with AMS brought by friends/family yesterday.  Unclear cause, pt admitted and is better today, fully oriented. Work up showed CO2 retention on ABG, bibasilar air space disease on CXR, pyuria on UA.  K up today at 6.3, but pt had full HD yesterday.    ROS  no CP   no SOB  +cough  no abd pain, n/v/d  no jt pain  no rash or HA  Past Medical History  Past Medical History  Diagnosis Date  . HYPOTHYROIDISM 02/17/2007    s/p surgical removal of goiter in 1997  . DIABETES MELLITUS, TYPE II 02/17/2007  . HYPERLIPIDEMIA 02/17/2007  . GOUT 05/29/2007  . ANEMIA-NOS 05/29/2007  . ANXIETY 03/23/2010  . 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 DEPRESSION 02/17/2007  . RESTLESS LEG SYNDROME 05/29/2007  . COMMON MIGRAINE 05/29/2007  . PERIPHERAL NEUROPATHY 05/29/2007  . HYPERTENSION 02/17/2007  . PERIPHERAL VASCULAR DISEASE 02/17/2007  . PEPTIC ULCER DISEASE 05/29/2007  . ESRD on hemodialysis 02/17/2007    Started dialysis April 2014.  Gets HD at [facility] on TTS schedule.  Cause of ESRD was HTN.     . OSTEOPENIA 09/22/2009  . SEIZURE DISORDER 02/17/2007  . Memory loss 01/24/2010  . Personal history of colonic polyps 11/16/2009  . Palpitations 09/08/2010  . Other diseases of vocal cords 1996  . HEART MURMUR, HX OF 10/02/2010  . 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 Chronic neck pain 12/13/2010  . GERD (gastroesophageal reflux disease)   . 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 Cancer of kidney 10/07/2012    Followed per Dr Despina Pole, MD, urology, [place]   . Arthritis   . Critical lower limb ischemia    Past Surgical History   Past Surgical History  Procedure Laterality Date  . Left toe amputated  2006  . Bunionectomy  1980  . Goiter removal  1997  . Stress cardiolite  06/18/2006  . Tranthoracic echocardiogram  06/18/2006  . Electrocardiogram  05/29/2006  . Cholecystectomy    . Rotator cuff repair  Left    Dr. Sharol Given  . Av fistula placement  03/13/2012    Procedure: ARTERIOVENOUS (AV) FISTULA CREATION;  Surgeon: Conrad Mascot, MD;  Location: [place];  Service: Vascular;  Laterality: Right;  . Eye surgery      bilateral cataract removal  . Esophagoscopy w/ botox injection  07/22/2012    Procedure: ESOPHAGOSCOPY WITH BOTOX INJECTION;  Surgeon: Rozetta Nunnery, MD;  Location: [place];  Service: ENT;  Laterality: N/A;  esophageal dilation  . Insertion of dialysis catheter N/A 02/05/2013    Procedure: INSERTION OF DIALYSIS CATHETER;  Surgeon: Angelia Mould, MD;  Location: [place];  Service: Vascular;  Laterality: N/A;  Ultrasound guided  . Toe amputation Right Aug. 2015    2nd    Family History  Family History  Problem Relation Age of Onset  . Dementia Mother   . Hypertension Mother   . Coronary artery disease Other   . Hyperlipidemia Other   . Hypertension Other   . Ovarian cancer Other   .  Stroke Other   . Hypertension Sister   . Hypertension Brother   . Heart attack Brother   . Stroke Brother    Social History  reports that she has quit smoking. Her smoking use included Cigarettes. She has a 11.75 pack-year smoking history. She has never used smokeless tobacco. She reports that she does not drink alcohol or use illicit drugs. Allergies  Allergies  Allergen Reactions  . Cephalosporins Itching and Rash    Vanc and fortaz given at the same time in June for several doses at dialysis with systemic rash and itching; received zinacef 7/5 and had worseningsystemicrash/ itching and swelling of eyes - so unclear if allergic to either or both  . Nsaids     REACTION: renal dysfunction   . Pioglitazone     Actos REACTION: EDEMA  . Vancomycin     See comment under cephalosporin   Home medications Prior to Admission medications   Medication Sig Start Date End Date Taking? Authorizing Provider  acetaminophen (TYLENOL) 325 MG tablet Take 2 tablets (650 mg total) by mouth every 6 (six) hours as needed for mild pain (or Fever >/= 101). 04/05/14  Yes Delfina Redwood, MD  albuterol (PROVENTIL HFA;VENTOLIN HFA) 108 (90 BASE) MCG/ACT inhaler Inhale 2 puffs into the lungs every 6 (six) hours as needed for wheezing or shortness of breath. 06/15/14  Yes Biagio Borg, MD  allopurinol (ZYLOPRIM) 100 MG tablet Take 100 mg by mouth daily.  01/04/12  Yes Historical Provider, MD  aspirin EC 81 MG tablet Take 81 mg by mouth daily.   Yes Historical Provider, MD  atorvastatin (LIPITOR) 40 MG tablet Take 40 mg by mouth daily.   Yes Historical Provider, MD  benzonatate (TESSALON) 100 MG capsule Take 100-200 mg by mouth every 6 (six) hours as needed for cough.   Yes Historical Provider, MD  cinacalcet (SENSIPAR) 30 MG tablet Take 1 tablet (30 mg total) by mouth daily. 04/12/14  Yes Gerlene Fee, NP  clonazePAM (KLONOPIN) 2 MG tablet Take one tablet by mouth twice daily as needed for anxiety Patient taking differently: Take 2 mg by mouth 2 (two) times daily.  04/20/14  Yes Estill Dooms, MD  colchicine 0.6 MG tablet Take 0.6 mg by mouth daily.    Yes Historical Provider, MD  cyclobenzaprine (FLEXERIL) 10 MG tablet Take 1 tablet (10 mg total) by mouth 3 (three) times daily as needed for muscle spasms. Patient taking differently: Take 10 mg by mouth 3 (three) times daily.  07/13/14  Yes Biagio Borg, MD  darbepoetin [facility]) 25 MCG/0.42ML SOLN injection Inject 25 mcg into the vein every 7 (seven) days. Every Thursday at dialysis   Yes Historical Provider, MD  docusate sodium (COLACE) 100 MG capsule Take 100 mg by mouth 2 (two) times daily.    Yes Historical Provider, MD  doxercalciferol (HECTOROL) 4  MCG/2ML injection Inject 4 mcg into the vein Every Tuesday,Thursday,and Saturday with dialysis.   Yes Historical Provider, MD  DULoxetine (CYMBALTA) 30 MG capsule Take 2 capsules (60 mg total) by mouth daily. 06/15/14  Yes Biagio Borg, MD  HYDROcodone-acetaminophen (NORCO/VICODIN) 5-325 MG per tablet Take 1 tablet by mouth every 6 (six) hours as needed for severe pain. 04/20/14  Yes Estill Dooms, MD  hydrOXYzine (ATARAX/VISTARIL) 25 MG tablet Take 1 tablet (25 mg total) by mouth every 6 (six) hours as needed for anxiety. 06/15/14  Yes Biagio Borg, MD  lactulose [facility]) 10 GM/15ML solution Take  20 g by mouth 2 (two) times daily.    Yes Historical Provider, MD  levothyroxine (SYNTHROID, LEVOTHROID) 125 MCG tablet Take 125 mcg by mouth daily before breakfast.   Yes Historical Provider, MD  LYRICA 75 MG capsule Take 75 mg by mouth daily.  07/09/14  Yes Historical Provider, MD  mometasone-formoterol (DULERA) 100-5 MCG/ACT AERO Inhale 2 puffs into the lungs 2 (two) times daily. 03/24/14  Yes Biagio Borg, MD  montelukast (SINGULAIR) 10 MG tablet Take 10 mg by mouth at bedtime.   Yes Historical Provider, MD  multivitamin (RENA-VIT) TABS tablet Take 1 tablet by mouth daily.   Yes Historical Provider, MD  omeprazole (PRILOSEC) 40 MG capsule Take 1 capsule (40 mg total) by mouth daily. 07/13/14  Yes Biagio Borg, MD  ondansetron (ZOFRAN) 4 MG tablet Take 4 mg by mouth every 6 (six) hours as needed for nausea or vomiting.   Yes Historical Provider, MD  polyethylene glycol (MIRALAX / GLYCOLAX) packet Take 17 g by mouth daily.   Yes Historical Provider, MD  QUEtiapine (SEROQUEL) 100 MG tablet Take 1 tablet (100 mg total) by mouth at bedtime. 04/05/14  Yes Delfina Redwood, MD  sevelamer carbonate (RENVELA) 800 MG tablet Take 2,400 mg by mouth 3 (three) times daily with meals.   Yes Historical Provider, MD   Liver Function Tests  Recent Labs Lab 08/03/14 1848  AST 38*  ALT 25  ALKPHOS 94  BILITOT 0.9   PROT 6.7  ALBUMIN 3.4*   No results for input(s): LIPASE, AMYLASE in the last 168 hours. CBC  Recent Labs Lab 08/03/14 1848 08/04/14 0550  WBC 7.5 9.6  HGB 11.3* 8.9*  HCT 34.3* 27.8*  MCV 95.5 93.3  PLT 37* 841   Basic Metabolic Panel  Recent Labs Lab 08/03/14 1848 08/04/14 0550  NA 137 136  K 4.8 6.3*  CL 98 99  CO2 27 24  GLUCOSE 83 138*  BUN 31* 37*  CREATININE 7.28* 7.37*  CALCIUM 8.2* 8.1*    Filed Vitals:   08/04/14 0157 08/04/14 0329 08/04/14 0531 08/04/14 0953  BP: 113/63  115/100 121/50  Pulse: 78  74 90  Temp: 99.3 F (37.4 C)  98.7 F (37.1 C) 98.8 F (37.1 C)  TempSrc: Oral  Oral Oral  Resp: 16  16 18   Height: 5' 7.5" (1.715 m)     Weight: 77.5 kg (170 lb 13.7 oz)     SpO2: 100% 99% 95% 98%   Exam Awake, alert, no sure how she got here, coughing off and on No rash, cyanosis or gangrene Sclera anicteric, throat clear No jvd or bruits Chest faint bibasilar rales, no wheezing or rhonchi RRR 2/6 SEM no rub/gallop Abd soft, NTND, no ascites, no HSM No LE or UE edema RUA AVF +bruit, +eschar 1x1 cm medial arm just above elbow, no signs of infection Neuro is alert, Ox 3 today, nonfocal  HD:  [place] 3h 67min   69.5 kg   2/2.25 [place]  P4  RUA AVF  Heparin 2200 Hectorol 6 ug TIW, no other meds   Assessment: 1. AMS - unclear cause, PNA , UTI, CO2 retention, polypharmacy 2. Fever - low grade bilat infiltrates on CXR, mild fever, pyurica. Better today , on empiric abx 3. Tobacco / COPD - had CO2 retention on ABG, prob acute as pt was wheezing in ED.  4. ESRD on HD TTS; had full HD yesterday 5. Seizure disorder 6. Hx "memory loss" - listed in  History 7. HTN/volume - no BP meds, up 8 kg by wt but no extra vol on exam, will reweigh pt.  BP soft.  8. 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 Hyperkalemia - despite having had full HD yesterday 10. Psych - on Seroquel, Cymbalta, Klonopin prn 11. Anemia - Hb down today 8.9, not on ESA at OP center,  follow   Plan - short HD today for high K, regular HD tomorrow. On emp abx, f/u cx's.   Kelly Splinter MD (pgr) [phone]    ([phone] 08/04/2014, 10:58 AM

## 2014-08-04 NOTE — Progress Notes (Signed)
CRITICAL VALUE ALERT  Critical value received:  K+ 6.3  Date of notification:  08/04/2014  Time of notification:  0714  Critical value read back:Yes.    Nurse who received alert:  Maggie Font, RN   MD notified (1st page):  Georgiann Mohs MD  Time of first page:  [phone]  MD notified (2nd page):  Time of second page:  Responding MD:  Georgiann Mohs MD  Time MD responded:  2620  Comments:   Potassium 3.5 - 5.1 mmol/L 6.3 (HH) 4.8CM 4.8R 4.6R   Comments: Please note change in reference range.  NO VISIBLE HEMOLYSIS  REPEATED TO VERIFY  CRITICAL RESULT CALLED TO, READ BACK BY AND VERIFIED WITH:  R.HILL,RN 3559 08/04/14 CLARK,S

## 2014-08-04 NOTE — Progress Notes (Signed)
Pt tested positive for MRSA PCR. Contact precautions placed and MD notified.

## 2014-08-05 LAB — URINE CULTURE: Colony Count: 15000

## 2014-08-05 LAB — CBC
HCT: 29.1 % — ABNORMAL LOW (ref 36.0–46.0)
Hemoglobin: 9.6 g/dL — ABNORMAL LOW (ref 12.0–15.0)
MCH: 30.1 pg (ref 26.0–34.0)
MCHC: 33 g/dL (ref 30.0–36.0)
MCV: 91.2 fL (ref 78.0–100.0)
Platelets: 201 10*3/uL (ref 150–400)
RBC: 3.19 MIL/uL — ABNORMAL LOW (ref 3.87–5.11)
RDW: 16.1 % — ABNORMAL HIGH (ref 11.5–15.5)
WBC: 8.2 10*3/uL (ref 4.0–10.5)

## 2014-08-05 LAB — BASIC METABOLIC PANEL
Anion gap: 13 (ref 5–15)
BUN: 21 mg/dL (ref 6–23)
CHLORIDE: 98 meq/L (ref 96–112)
CO2: 26 mmol/L (ref 19–32)
Calcium: 8.8 mg/dL (ref 8.4–10.5)
Creatinine, Ser: 5.57 mg/dL — ABNORMAL HIGH (ref 0.50–1.10)
GFR, EST AFRICAN AMERICAN: 8 mL/min — AB (ref >90)
GFR, EST NON AFRICAN AMERICAN: 7 mL/min — AB (ref >90)
Glucose, Bld: 112 mg/dL — ABNORMAL HIGH (ref 70–99)
POTASSIUM: 4.5 mmol/L (ref 3.5–5.1)
SODIUM: 137 mmol/L (ref 135–145)

## 2014-08-05 LAB — PHOSPHORUS: Phosphorus: 4.9 mg/dL — ABNORMAL HIGH (ref 2.3–4.6)

## 2014-08-05 LAB — ALBUMIN: ALBUMIN: 3 g/dL — AB (ref 3.5–5.2)

## 2014-08-05 MED ORDER — LEVOFLOXACIN 500 MG PO TABS: 500.0000 mg | ORAL_TABLET | ORAL | Status: DC

## 2014-08-05 MED ORDER — HYDROCODONE-ACETAMINOPHEN 5-325 MG PO TABS: 1.0000 | ORAL_TABLET | Freq: Four times a day (QID) | ORAL | Status: DC | PRN

## 2014-08-05 MED ORDER — LEVOTHYROXINE SODIUM 150 MCG PO TABS: 150.0000 ug | ORAL_TABLET | Freq: Every day | ORAL | Status: DC

## 2014-08-05 MED ORDER — SODIUM CHLORIDE 0.9 % IV SOLN: 100.0000 mL | INTRAVENOUS | Status: DC | PRN

## 2014-08-05 MED ORDER — LEVOFLOXACIN 500 MG PO TABS
500.0000 mg | ORAL_TABLET | ORAL | Status: DC
  Filled 2014-08-05: qty 1

## 2014-08-05 MED ORDER — CLONAZEPAM 0.5 MG PO TABS: 0.5000 mg | ORAL_TABLET | Freq: Two times a day (BID) | ORAL | Status: DC | PRN

## 2014-08-05 MED ORDER — LIDOCAINE-PRILOCAINE 2.5-2.5 % EX CREA: 1.0000 "application " | TOPICAL_CREAM | CUTANEOUS | Status: DC | PRN

## 2014-08-05 MED ORDER — NEPRO/CARBSTEADY PO LIQD: 237.0000 mL | ORAL | Status: DC | PRN

## 2014-08-05 MED ORDER — ALTEPLASE 2 MG IJ SOLR
2.0000 mg | Freq: Once | INTRAMUSCULAR | Status: DC | PRN
  Filled 2014-08-05: qty 2

## 2014-08-05 MED ORDER — HEPARIN SODIUM (PORCINE) 1000 UNIT/ML DIALYSIS: 1000.0000 [IU] | INTRAMUSCULAR | Status: DC | PRN

## 2014-08-05 MED ORDER — HYDROXYZINE HCL 10 MG PO TABS: 10.0000 mg | ORAL_TABLET | Freq: Three times a day (TID) | ORAL | Status: DC | PRN

## 2014-08-05 MED ORDER — DOXERCALCIFEROL 4 MCG/2ML IV SOLN
INTRAVENOUS | Status: DC
Start: 2014-08-05 — End: 2014-08-05
  Filled 2014-08-05: qty 4

## 2014-08-05 MED ORDER — HEPARIN SODIUM (PORCINE) 1000 UNIT/ML DIALYSIS: 2200.0000 [IU] | Freq: Once | INTRAMUSCULAR | Status: DC

## 2014-08-05 MED ORDER — PREGABALIN 25 MG PO CAPS: 25.0000 mg | ORAL_CAPSULE | Freq: Every day | ORAL | Status: DC

## 2014-08-05 MED ORDER — PENTAFLUOROPROP-TETRAFLUOROETH EX AERO: 1.0000 "application " | INHALATION_SPRAY | CUTANEOUS | Status: DC | PRN

## 2014-08-05 MED ORDER — QUETIAPINE FUMARATE 100 MG PO TABS: 50.0000 mg | ORAL_TABLET | Freq: Every day | ORAL | Status: DC

## 2014-08-05 MED ORDER — WHITE PETROLATUM GEL
Status: AC
  Administered 2014-08-05: 1
  Filled 2014-08-05: qty 5

## 2014-08-05 MED ORDER — LIDOCAINE HCL (PF) 1 % IJ SOLN: 5.0000 mL | INTRAMUSCULAR | Status: DC | PRN

## 2014-08-05 MED ORDER — DARBEPOETIN ALFA 40 MCG/0.4ML IJ SOSY
40.0000 ug | PREFILLED_SYRINGE | INTRAMUSCULAR | Status: DC
  Filled 2014-08-05: qty 0.4

## 2014-08-05 NOTE — Progress Notes (Signed)
Patient discharge teaching given patient and patient's friend, including activity, diet, follow-up appoints, and medications. Patient verbalized understanding of all discharge instructions. IV access was d/c'd. Vitals are stable. Skin is intact except as charted in most recent assessments. Pt to be escorted out by NT, to be driven home by family.  Jillyn Ledger, MBA, BS, RN

## 2014-08-05 NOTE — Procedures (Signed)
I was present at this dialysis session, have reviewed the session itself and made  appropriate changes  Kelly Splinter MD (pgr) [phone]    ([phone] 08/05/2014, 10:23 AM

## 2014-08-05 NOTE — Progress Notes (Signed)
PHARMACIST - PHYSICIAN COMMUNICATION DR:   Darrick Meigs CONCERNING: Antibiotic IV to Oral Route Change Policy  RECOMMENDATION: This patient is receiving Levaquin by the intravenous route.  Based on criteria approved by the Pharmacy and Therapeutics Committee, the antibiotic(s) is/are being converted to the equivalent oral dose form(s).   DESCRIPTION: These criteria include:  Patient being treated for a respiratory tract infection, urinary tract infection, cellulitis or clostridium difficile associated diarrhea if on metronidazole  The patient is not neutropenic and does not exhibit a GI malabsorption state  The patient is eating (either orally or via tube) and/or has been taking other orally administered medications for a least 24 hours  The patient is improving clinically and has a Tmax < 100.5  If you have questions about this conversion, please contact the Pharmacy Department  []   [phone] )  [name] [name] [x]   [phone] )  [name] [name]  []   [phone] )  [facility] []   [phone] )  [place], PharmD, BCPS

## 2014-08-05 NOTE — Care Management Note (Signed)
CARE MANAGEMENT NOTE 08/05/2014  Patient:  [name] [name], [name] [name]   Account Number:  [id number]  Date Initiated:  08/05/2014  Documentation initiated by:  Yariah Selvey  Subjective/Objective Assessment:   CM following for progression and d/c planning     Action/Plan:   Met with pt 08/05/2014 who selected [facility] for [facility] services. [facility] notified of orders, pt HD schedule and cell phone number [phone].   Anticipated DC Date:  08/05/2014   Anticipated DC Plan:  [place]         Choice offered to / List presented to:          [facility] arranged  HH-1 RN  [place] agency  [facility]   Status of service:  Completed, signed off Medicare Important Message given?  NA - LOS <3 / Initial given by admissions (If response is "NO", the following Medicare IM given date fields will be blank) Date Medicare IM given:   Medicare IM given by:   Date Additional Medicare IM given:   Additional Medicare IM given by:    Discharge Disposition:  [place]  Per UR Regulation:    If discussed at Long Length of Stay Meetings, dates discussed:    Comments:

## 2014-08-05 NOTE — Progress Notes (Signed)
OT Cancellation Note  Patient Details Name: [name] [name] MRN: [id number] DOB: 07-Dec-1940   Cancelled Treatment:    Reason Eval/Treat Not Completed: Patient at procedure or test/ unavailable  Parke Poisson B 08/05/2014, 10:57 AM

## 2014-08-05 NOTE — Discharge Summary (Addendum)
Physician Discharge Summary  [name] [name] WUJ:[id number] DOB: [birth year] DOA: 08/03/2014  PCP: Cathlean Cower, MD  Admit date: 08/03/2014 Discharge date: 08/05/2014  Time spent: *50 minutes  Recommendations for Outpatient Follow-up:  1. Follow up PCP in 2 weeks  Discharge Diagnoses:  Principal Problem:   Acute encephalopathy Active Problems:   Hypothyroidism   HLD (hyperlipidemia)   Gout   Essential hypertension   Peripheral vascular disease   Asthmatic bronchitis , chronic   GERD   Peptic ulcer   Seizure   Cancer of kidney   COPD (chronic obstructive pulmonary disease)   ESRD on hemodialysis   Thrombocytopenia   Discharge Condition: Stable  Diet recommendation: Low salt diet  Filed Weights   08/04/14 2139 08/05/14 0723 08/05/14 1126  Weight: 72.6 kg (160 lb 0.9 oz) 72.5 kg (159 lb 13.3 oz) 69.9 kg (154 lb 1.6 oz)    History of present illness:  73 y.o. female with PMH of ESRD on HD (T/T/S) , hx of seizures disorder, HTN, Hyperlipidemia, Hypothyroid, Anemia, who presents with AMS.   Patient went to dialysis center for HD today. She was noticed by the staff to be slugish and slow to response when she got to dialysis center prior to starting dialysis. Dialysis center staff reported that pt's BP was running in low 90s. Per EMS, BP-93/36, CBG-71.   When I evaluated patient in Ed, she has slow response, but she is ordered to the time, person and place. She reports that she still urinates a little. This morning she started having dysuria, no burning on urination. She also reports having cough with brown-yellow colored sputum production. She has congestion and mild SOB. She does not have chest pain. She does not have subjective fever, but her temperature is 99.1 in emergency room. She denies fever, chills, headaches, chest pain, abdominal pain, diarrhea, hematuria, skin rashes, joint pain or leg swelling.  Work up in the ED demonstrates thrombocytopenia with platelet decreased  from previous 242 on 04/03/14 to 37. Troponin negative. Lactate 2.6. INR 1.19. Chest x-ray shows atelectasis, but no infiltration. No leukocytosis. CT-head without acute abnormalities. Patient is admitted to inpatient for further evaluation and treatment.  Hospital Course:  1. Altered mental status- resolved, patient back to her baseline. Started on empiric antibiotics, Levaquin. Urine culture is pending. Chest x-ray does not show any infiltrate. 2. Thrombocytopenia- ? Lab error, yesterday when patient came off. White count was 37. This morning platelet count is back to 181. Continue to monitor the CBC in the hospital. 3. History of bronchitis- patient recently was given tapering dose of prednisone, she is currently not taking prednisone at home. Will discharge on po levaquin 500 mg po Q 48 hrs for five more days. 4. End-stage renal disease- patient is on hemodialysis Tuesday Thursday and Saturday. Her creatinine is 7.28. Potassium 6.3. Nephrology consulted.Dialysed today. . 5. Hypothyroidism- continue Synthroid. TSH is elevated 10.537. We'll increase the dose of Synthroid to 150 g by mouth daily 6. Hyperkalemia- resolved after HD 7. Deconditioning/falls- Psych meds adjusted, have cut dow the dose of psychotropics which was making patient very drowsy. HHPT/RN ordered.  Procedures:  None  Consultations:  Nephrology  Discharge Exam: Filed Vitals:   08/05/14 1126  BP: 141/68  Pulse: 83  Temp: 97.8 F (36.6 C)  Resp: 18    General: Appear in no acute distress Cardiovascular: S1s2 RRR Respiratory: *clear bilaterally  Discharge Instructions   Discharge Instructions    Diet - low sodium heart healthy  Complete by:  As directed      Discharge instructions    Complete by:  As directed   Home health physical therapy     Increase activity slowly    Complete by:  As directed           Current Discharge Medication List    START taking these medications   Details  levofloxacin  (LEVAQUIN) 500 MG tablet Take 1 tablet (500 mg total) by mouth every other day. Qty: 3 tablet, Refills: 0      CONTINUE these medications which have CHANGED   Details  clonazePAM (KLONOPIN) 0.5 MG tablet Take 1 tablet (0.5 mg total) by mouth 2 (two) times daily as needed (anxiety). Qty: 30 tablet, Refills: 0    HYDROcodone-acetaminophen (NORCO/VICODIN) 5-325 MG per tablet Take 1 tablet by mouth every 6 (six) hours as needed for severe pain. Qty: 30 tablet, Refills: 0    hydrOXYzine (ATARAX/VISTARIL) 10 MG tablet Take 1 tablet (10 mg total) by mouth 3 (three) times daily as needed for anxiety. Qty: 30 tablet, Refills: 0    levothyroxine (SYNTHROID, LEVOTHROID) 150 MCG tablet Take 1 tablet (150 mcg total) by mouth daily before breakfast. Qty: 30 tablet, Refills: 2    pregabalin (LYRICA) 25 MG capsule Take 1 capsule (25 mg total) by mouth daily. Qty: 30 capsule, Refills: 2    QUEtiapine (SEROQUEL) 100 MG tablet Take 0.5 tablets (50 mg total) by mouth at bedtime. Qty: 30 tablet, Refills: 0      CONTINUE these medications which have NOT CHANGED   Details  acetaminophen (TYLENOL) 325 MG tablet Take 2 tablets (650 mg total) by mouth every 6 (six) hours as needed for mild pain (or Fever >/= 101).    albuterol (PROVENTIL HFA;VENTOLIN HFA) 108 (90 BASE) MCG/ACT inhaler Inhale 2 puffs into the lungs every 6 (six) hours as needed for wheezing or shortness of breath. Qty: 1 Inhaler, Refills: 11    allopurinol (ZYLOPRIM) 100 MG tablet Take 100 mg by mouth daily.     aspirin EC 81 MG tablet Take 81 mg by mouth daily.    atorvastatin (LIPITOR) 40 MG tablet Take 40 mg by mouth daily.    benzonatate (TESSALON) 100 MG capsule Take 100-200 mg by mouth every 6 (six) hours as needed for cough.    cinacalcet (SENSIPAR) 30 MG tablet Take 1 tablet (30 mg total) by mouth daily. Qty: 60 tablet, Refills: 11   Associated Diagnoses: Hyperparathyroidism, secondary renal    colchicine 0.6 MG tablet Take  0.6 mg by mouth daily.     cyclobenzaprine (FLEXERIL) 10 MG tablet Take 1 tablet (10 mg total) by mouth 3 (three) times daily as needed for muscle spasms. Qty: 30 tablet, Refills: 2    darbepoetin (ARANESP) 25 MCG/0.42ML SOLN injection Inject 25 mcg into the vein every 7 (seven) days. Every Thursday at dialysis    docusate sodium (COLACE) 100 MG capsule Take 100 mg by mouth 2 (two) times daily.     doxercalciferol (HECTOROL) 4 MCG/2ML injection Inject 4 mcg into the vein Every Tuesday,Thursday,and Saturday with dialysis.    DULoxetine (CYMBALTA) 30 MG capsule Take 2 capsules (60 mg total) by mouth daily. Qty: 60 capsule, Refills: 11    lactulose (CHRONULAC) 10 GM/15ML solution Take 20 g by mouth 2 (two) times daily.     mometasone-formoterol (DULERA) 100-5 MCG/ACT AERO Inhale 2 puffs into the lungs 2 (two) times daily. Qty: 1 Inhaler, Refills: 6    montelukast (SINGULAIR) 10  MG tablet Take 10 mg by mouth at bedtime.    multivitamin (RENA-VIT) TABS tablet Take 1 tablet by mouth daily.    omeprazole (PRILOSEC) 40 MG capsule Take 1 capsule (40 mg total) by mouth daily. Qty: 30 capsule, Refills: 5    ondansetron (ZOFRAN) 4 MG tablet Take 4 mg by mouth every 6 (six) hours as needed for nausea or vomiting.    polyethylene glycol (MIRALAX / GLYCOLAX) packet Take 17 g by mouth daily.    sevelamer carbonate (RENVELA) 800 MG tablet Take 2,400 mg by mouth 3 (three) times daily with meals.       Allergies  Allergen Reactions  . Cephalosporins Itching and Rash    Vanc and fortaz given at the same time in June for several doses at dialysis with systemic rash and itching; received zinacef 7/5 and had worseningsystemicrash/ itching and swelling of eyes - so unclear if allergic to either or both  . Nsaids     REACTION: renal dysfunction  . Pioglitazone     Actos REACTION: EDEMA  . Vancomycin     See comment under cephalosporin   Follow-up Information    Follow up with Cathlean Cower, MD In 2  weeks.   Specialties:  Internal Medicine, Radiology   Contact information:   [address] [place] [address] [phone]        The results of significant diagnostics from this hospitalization (including imaging, microbiology, ancillary and laboratory) are listed below for reference.    Significant Diagnostic Studies: Ct Head Wo Contrast  08/03/2014   CLINICAL DATA:  Confusion.  EXAM: CT HEAD WITHOUT CONTRAST  TECHNIQUE: Contiguous axial images were obtained from the base of the skull through the vertex without intravenous contrast.  COMPARISON:  03/08/2012  FINDINGS: Skull and Sinuses:No acute fracture destructive process.  Mild inflammatory mucosal thickening in the right ethmoid sinuses.  Orbits: No acute findings.  Bilateral cataract resection.  Brain: No evidence of acute infarction, hemorrhage, hydrocephalus, or mass lesion/mass effect. Generalized cerebral volume loss which is typical for age and not progressed since 2013. No significant ischemic change.  IMPRESSION: Age normal head CT.   Electronically Signed   By: Jorje Guild M.D.   On: 08/03/2014 23:29   Dg Chest Portable 1 View  08/03/2014   CLINICAL DATA:  Acute onset of altered mental status. Cough and shortness of breath. Hypoxemia.  EXAM: PORTABLE CHEST - 1 VIEW  COMPARISON:  Chest x-ray dated 05/20/2014 and 03/30/2014  FINDINGS: The heart size and pulmonary vascularity are normal. There is tortuosity and calcification of the thoracic aorta.  The patient has bibasilar atelectasis with a shallow inspiration. No effusions. Moderate thoracolumbar scoliosis. Fairly severe arthritis the left shoulder. No acute osseous abnormality.  IMPRESSION: Bibasilar atelectasis, right greater than left. At least some of this is due to a shallow inspiration.   Electronically Signed   By: Rozetta Nunnery M.D.   On: 08/03/2014 19:42    Microbiology: Recent Results (from the past 240 hour(s))  Blood culture (routine x 2)     Status:  None (Preliminary result)   Collection Time: 08/03/14  8:11 PM  Result Value Ref Range Status   Specimen Description BLOOD LEFT FOREARM  Final   Special Requests BOTTLES DRAWN AEROBIC ONLY 2 CC  Final   Culture   Final           BLOOD CULTURE RECEIVED NO GROWTH TO DATE CULTURE WILL BE HELD FOR 5 DAYS BEFORE ISSUING  A FINAL NEGATIVE REPORT Performed at [facility]    Report Status PENDING  Incomplete  Blood culture (routine x 2)     Status: None (Preliminary result)   Collection Time: 08/03/14  8:18 PM  Result Value Ref Range Status   Specimen Description BLOOD LEFT HAND  Final   Special Requests   Final    BOTTLES DRAWN AEROBIC AND ANAEROBIC BLUE 3 CC RED 2 CC   Culture   Final           BLOOD CULTURE RECEIVED NO GROWTH TO DATE CULTURE WILL BE HELD FOR 5 DAYS BEFORE ISSUING A FINAL NEGATIVE REPORT Performed at [facility]    Report Status PENDING  Incomplete  Urine culture     Status: None   Collection Time: 08/04/14  2:54 AM  Result Value Ref Range Status   Specimen Description URINE, RANDOM  Final   Special Requests NONE  Final   Colony Count   Final    15,000 COLONIES/ML Performed at [facility]    Culture   Final    Multiple bacterial morphotypes present, none predominant. Suggest appropriate recollection if clinically indicated. Performed at [facility]    Report Status 08/05/2014 FINAL  Final  MRSA PCR Screening     Status: Abnormal   Collection Time: 08/04/14  3:29 AM  Result Value Ref Range Status   MRSA by PCR POSITIVE (A) NEGATIVE Final    Comment:        The GeneXpert MRSA Assay (FDA approved for NASAL specimens only), is one component of a comprehensive MRSA colonization surveillance program. It is not intended to diagnose MRSA infection nor to guide or monitor treatment for MRSA infections. RESULT CALLED TO, READ BACK BY AND VERIFIED WITH: CALLED TO RN Mickel Baas [facility] [phone] @[phone]  THANEY      Labs: Basic Metabolic  Panel:  Recent Labs Lab 08/03/14 1848 08/04/14 0550 08/05/14 0851  NA 137 136 137  K 4.8 6.3* 4.5  CL 98 99 98  CO2 27 24 26   GLUCOSE 83 138* 112*  BUN 31* 37* 21  CREATININE 7.28* 7.37* 5.57*  CALCIUM 8.2* 8.1* 8.8  MG 2.4  --   --    Liver Function Tests:  Recent Labs Lab 08/03/14 1848  AST 38*  ALT 25  ALKPHOS 94  BILITOT 0.9  PROT 6.7  ALBUMIN 3.4*   No results for input(s): LIPASE, AMYLASE in the last 168 hours. No results for input(s): AMMONIA in the last 168 hours. CBC:  Recent Labs Lab 08/03/14 1848 08/04/14 0550 08/05/14 0851  WBC 7.5 9.6 8.2  HGB 11.3* 8.9* 9.6*  HCT 34.3* 27.8* 29.1*  MCV 95.5 93.3 91.2  PLT 37* 181 201   Cardiac Enzymes:  Recent Labs Lab 08/03/14 1848  TROPONINI <0.03   BNP: BNP (last 3 results) No results for input(s): PROBNP in the last 8760 hours. CBG:  Recent Labs Lab 08/03/14 1824 08/04/14 0759  GLUCAP 76 120*       Signed:  Parthenia Tellefsen S  [facility] 08/05/2014, 12:53 PM

## 2014-08-05 NOTE — Progress Notes (Signed)
[facility] Progress Note  Assessment/Plan: 1. AMS - unclear cause, PNA , UTI, CO2 retention, polypharmacy - pt at baseline now 2. Fever - low grade bilat infiltrates on CXR, mild fever, pyurica. Better today , on empiric abx 3. Tobacco / COPD - had CO2 retention on ABG, prob acute as pt was wheezing in ED.  4. ESRD on HD TTS; had full HD 12/29 with ^ K 12/30 - had extra HD for 2.5 hours with UF 1 L- Next HD due Sunday per holiday schedule- repeat labs today pending 5. Seizure disorder 6. Hx "memory loss" - listed in History 7. HTN/volume - no BP meds, up 8 kg by wt but no extra vol on exam, will reweigh pt. BP soft.- not getting to edw at outpt center- likely will ^ to about 70 at d/c 8. 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 Hyperkalemia - despite having had full HD 12/29 - two things may have been contributory - she missed 12/27 HD and also had a history of PTA of 88% basilic vein stenosis 82/8 for low AF in November but then never had a follow up AF in Dec post intervention.  Calculated machine kt/v and URR ok; dietary indescretion could be the other contributory  10. Psych - on Seroquel, Cymbalta, Klonopin prn 11. Anemia - Hb down today 8.9 12/30- previously 11.2 at outpt center - last aranesp given 50 on 12/17 - held - repeat CBC today if still down, need to resume ESA 12. Thrombocytopenia - 37 on admission - wnl now ? Lab error- follow  Myriam Jacobson, PA-C [facility] [phone] 08/05/2014,8:42 AM  LOS: 2 days   Pt seen, examined and agree w A/P as above. AMS unclear cause but polypharmacy likely contributing.  Have d/w primary MD who will look at potentially sedating psych/pain meds prior to probable dc today.   Kelly Splinter MD pager [phone]    cell [phone] 08/05/2014, 10:16 AM    Subjective:   No appetite or SOB  Objective Filed Vitals:   08/05/14 0500 08/05/14 0723 08/05/14 0741 08/05/14 0746  BP: 120/62 119/56 111/58 111/59  Pulse: 79  82 77 75  Temp: 98.1 F (36.7 C) 98.2 F (36.8 C)    TempSrc: Oral Oral    Resp: 20     Height:      Weight:  72.5 kg (159 lb 13.3 oz)    SpO2: 93% 95%     Physical Exam General: NAD, voice raspy hoarse Heart: RRR Lungs: no rales Abdomen: soft NT Extremities: no sig edema Dialysis Access: right upper AVF Qb 400 AP/VP ok  Dialysis Orders: [facility] 3h 71min 69.5 kg 2/2.25 [place] P4 RUA AVF Heparin 2200 Hectorol 6 ug TIW, no other meds Recent labs:  Hgb 11.2 26% sat ferritin 1663 - Aranesp last given 40 12/17 - then held  Additional Objective Labs: Basic Metabolic Panel:  Recent Labs Lab 08/03/14 1848 08/04/14 0550  NA 137 136  K 4.8 6.3*  CL 98 99  CO2 27 24  GLUCOSE 83 138*  BUN 31* 37*  CREATININE 7.28* 7.37*  CALCIUM 8.2* 8.1*   Liver Function Tests:  Recent Labs Lab 08/03/14 1848  AST 38*  ALT 25  ALKPHOS 94  BILITOT 0.9  PROT 6.7  ALBUMIN 3.4*   CBC:  Recent Labs Lab 08/03/14 1848 08/04/14 0550  WBC 7.5 9.6  HGB 11.3* 8.9*  HCT 34.3* 27.8*  MCV 95.5 93.3  PLT 37* 181   Cardiac  Enzymes:  Recent Labs Lab 08/03/14 1848  TROPONINI <0.03   CBG:  Recent Labs Lab 08/03/14 1824 08/04/14 0759  GLUCAP 76 120*  Studies/Results: Ct Head Wo Contrast  08/03/2014   CLINICAL DATA:  Confusion.  EXAM: CT HEAD WITHOUT CONTRAST  TECHNIQUE: Contiguous axial images were obtained from the base of the skull through the vertex without intravenous contrast.  COMPARISON:  03/08/2012  FINDINGS: Skull and Sinuses:No acute fracture destructive process.  Mild inflammatory mucosal thickening in the right ethmoid sinuses.  Orbits: No acute findings.  Bilateral cataract resection.  Brain: No evidence of acute infarction, hemorrhage, hydrocephalus, or mass lesion/mass effect. Generalized cerebral volume loss which is typical for age and not progressed since 2013. No significant ischemic change.  IMPRESSION: Age normal head CT.   Electronically Signed   By:  Jorje Guild M.D.   On: 08/03/2014 23:29   Dg Chest Portable 1 View  08/03/2014   CLINICAL DATA:  Acute onset of altered mental status. Cough and shortness of breath. Hypoxemia.  EXAM: PORTABLE CHEST - 1 VIEW  COMPARISON:  Chest x-ray dated 05/20/2014 and 03/30/2014  FINDINGS: The heart size and pulmonary vascularity are normal. There is tortuosity and calcification of the thoracic aorta.  The patient has bibasilar atelectasis with a shallow inspiration. No effusions. Moderate thoracolumbar scoliosis. Fairly severe arthritis the left shoulder. No acute osseous abnormality.  IMPRESSION: Bibasilar atelectasis, right greater than left. At least some of this is due to a shallow inspiration.   Electronically Signed   By: Rozetta Nunnery M.D.   On: 08/03/2014 19:42   Medications:   . allopurinol  100 mg Oral Daily  . aspirin EC  81 mg Oral Daily  . atorvastatin  40 mg Oral Daily  . Chlorhexidine Gluconate Cloth  6 each Topical Q0600  . cinacalcet  30 mg Oral Daily  . doxercalciferol  4 mcg Intravenous Q T,Th,Sa-HD  . doxercalciferol  6 mcg Intravenous Q T,Th,Sa-HD  . DULoxetine  60 mg Oral Daily  . [START ON 08/06/2014] heparin  2,200 Units Dialysis Once in dialysis  . heparin  5,000 Units Subcutaneous 3 times per day  . ipratropium-albuterol  3 mL Nebulization TID  . lactulose  20 g Oral Daily  . levofloxacin (LEVAQUIN) IV  500 mg Intravenous Q48H  . levothyroxine  150 mcg Oral QAC breakfast  . montelukast  10 mg Oral QHS  . multivitamin  1 tablet Oral Daily  . mupirocin ointment  1 application Nasal BID  . nicotine  14 mg Transdermal Daily  . pantoprazole  40 mg Oral Daily  . polyethylene glycol  17 g Oral Daily  . QUEtiapine  100 mg Oral QHS  . sevelamer carbonate  2,400 mg Oral TID WC  . sodium chloride  3 mL Intravenous Q12H

## 2014-08-10 ENCOUNTER — Other Ambulatory Visit: Payer: Self-pay | Admitting: Orthopedic Surgery

## 2014-08-10 DIAGNOSIS — M545 Low back pain: Secondary | ICD-10-CM

## 2014-08-10 LAB — CULTURE, BLOOD (ROUTINE X 2)
Culture: NO GROWTH
Culture: NO GROWTH

## 2014-08-12 ENCOUNTER — Telehealth: Payer: Self-pay | Admitting: Internal Medicine

## 2014-08-12 NOTE — Telephone Encounter (Signed)
[name] [name] calling for home health nurse orders needs verbal order   --- [phone]

## 2014-08-12 NOTE — Telephone Encounter (Signed)
Ok for verbal 

## 2014-08-13 NOTE — Telephone Encounter (Signed)
HHRN informed of verbal ok.

## 2014-08-19 ENCOUNTER — Telehealth: Payer: Self-pay | Admitting: Internal Medicine

## 2014-08-19 NOTE — Telephone Encounter (Signed)
[name] [name] / PT with [facility] is needing orders for twice a week for 6 weeks strength, balance, walking training  [phone]

## 2014-08-19 NOTE — Telephone Encounter (Signed)
Ok for verbal 

## 2014-08-19 NOTE — Telephone Encounter (Signed)
HHRN informed of verbal ok.

## 2014-08-20 ENCOUNTER — Ambulatory Visit
Admission: RE | Admit: 2014-08-20 | Discharge: 2014-08-20 | Disposition: A | Discharge status: Home / Self Care | Payer: Medicare Other | Source: Ambulatory Visit | Attending: Orthopedic Surgery | Admitting: Orthopedic Surgery

## 2014-08-20 DIAGNOSIS — M545 Low back pain: Secondary | ICD-10-CM

## 2014-08-22 ENCOUNTER — Other Ambulatory Visit: Payer: Self-pay | Admitting: Internal Medicine

## 2014-08-22 ENCOUNTER — Other Ambulatory Visit: Payer: Self-pay | Admitting: Adult Health

## 2014-08-25 ENCOUNTER — Ambulatory Visit: Payer: Medicare Other | Admitting: Podiatrist

## 2014-08-27 ENCOUNTER — Ambulatory Visit: Payer: Medicare Other | Admitting: Internal Medicine

## 2014-08-31 DIAGNOSIS — J441 Chronic obstructive pulmonary disease with (acute) exacerbation: Secondary | ICD-10-CM

## 2014-09-01 ENCOUNTER — Ambulatory Visit: Payer: Medicare Other | Admitting: Internal Medicine

## 2014-09-01 ENCOUNTER — Other Ambulatory Visit: Payer: Self-pay | Admitting: Adult Health

## 2014-09-04 ENCOUNTER — Other Ambulatory Visit: Payer: Self-pay | Admitting: Adult Health

## 2014-09-04 ENCOUNTER — Other Ambulatory Visit: Payer: Self-pay | Admitting: Internal Medicine

## 2014-09-07 MED ORDER — CLONAZEPAM 0.5 MG PO TABS: 0.5000 mg | ORAL_TABLET | Freq: Two times a day (BID) | ORAL | Status: DC | PRN

## 2014-09-07 NOTE — Telephone Encounter (Signed)
Done hardcopy to robin  

## 2014-09-08 ENCOUNTER — Encounter: Payer: Self-pay | Admitting: Internal Medicine

## 2014-09-08 ENCOUNTER — Ambulatory Visit (INDEPENDENT_AMBULATORY_CARE_PROVIDER_SITE_OTHER): Payer: Medicare Other | Admitting: Internal Medicine

## 2014-09-08 ENCOUNTER — Other Ambulatory Visit: Payer: Self-pay | Admitting: Adult Health

## 2014-09-08 VITALS — BP 132/72 | HR 101 | Temp 99.0°F | Ht 68.0 in | Wt 153.5 lb

## 2014-09-08 DIAGNOSIS — G9340 Encephalopathy, unspecified: Secondary | ICD-10-CM

## 2014-09-08 DIAGNOSIS — I10 Essential (primary) hypertension: Secondary | ICD-10-CM

## 2014-09-08 DIAGNOSIS — R7302 Impaired glucose tolerance (oral): Secondary | ICD-10-CM

## 2014-09-08 DIAGNOSIS — E039 Hypothyroidism, unspecified: Secondary | ICD-10-CM

## 2014-09-08 NOTE — Assessment & Plan Note (Signed)
stable overall by history and exam, recent data reviewed with pt, and pt to continue medical treatment as before,  to f/u any worsening symptoms or concerns BP Readings from Last 3 Encounters:  09/08/14 132/72  08/05/14 141/68  07/28/14 100/59

## 2014-09-08 NOTE — Progress Notes (Signed)
Pre visit review using our clinic review tool, if applicable. No additional management support is needed unless otherwise documented below in the visit note. 

## 2014-09-08 NOTE — Telephone Encounter (Signed)
Faxed hardcopy for Clonazepam to [place]

## 2014-09-08 NOTE — Assessment & Plan Note (Signed)
Resolved, likely combination I suspect of bronchitic illness and polypharmacy , now improved, to cont meds as is for now, I declined to change her meds back to original dosing, especially the psychotropics.

## 2014-09-08 NOTE — Assessment & Plan Note (Signed)
Now on higher dose synthroid - for fu thyroid

## 2014-09-08 NOTE — Assessment & Plan Note (Signed)
stable overall by history and exam, recent data reviewed with pt, and pt to continue medical treatment as before,  to f/u any worsening symptoms or concerns Lab Results  Component Value Date   HGBA1C 6.1* 03/31/2014

## 2014-09-08 NOTE — Patient Instructions (Signed)
Please continue all other medications as before, and refills have been done if requested.  Please have the pharmacy call with any other refills you may need.  Please continue your efforts at being more active, low cholesterol diet, and weight control.  Please keep your appointments with your specialists as you may have planned  Please return in 4 weeks for thyroid testing at the lab  Please return in 6 months, or sooner if needed

## 2014-09-08 NOTE — Progress Notes (Signed)
Subjective:    Patient ID: [name] [name], female    DOB: [birth year], 74 y.o.   MRN: [id number]  HPI  Here to f/u recent hospn with acute encephalopathy of uncertain etiology;  hospd dec 27-29, significant exam done , ? Helped with levaquin, but also ? Med side effect/over medicated. Received HD while there.  D/c'd on prednisone taper, and lower dose psychotropics.  But pt states she has gone back on original doses post hosp until today, and states she does not think it could be drug side effect since she wasn't taking most of them anyway. Synthroid incresaed from 125 to 150 qd. Finished the levaquin. Past Medical History  Diagnosis Date  . HYPOTHYROIDISM 02/17/2007    s/p surgical removal of goiter in 1997  . DIABETES MELLITUS, TYPE II 02/17/2007  . HYPERLIPIDEMIA 02/17/2007  . GOUT 05/29/2007  . ANEMIA-NOS 05/29/2007  . ANXIETY 03/23/2010  . 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 DEPRESSION 02/17/2007  . RESTLESS LEG SYNDROME 05/29/2007  . COMMON MIGRAINE 05/29/2007  . PERIPHERAL NEUROPATHY 05/29/2007  . HYPERTENSION 02/17/2007  . PERIPHERAL VASCULAR DISEASE 02/17/2007  . PEPTIC ULCER DISEASE 05/29/2007  . ESRD on hemodialysis 02/17/2007    Started dialysis April 2014.  Gets HD at [facility] on TTS schedule.  Cause of ESRD was HTN.     . OSTEOPENIA 09/22/2009  . SEIZURE DISORDER 02/17/2007  . Memory loss 01/24/2010  . Personal history of colonic polyps 11/16/2009  . Palpitations 09/08/2010  . Other diseases of vocal cords 1996  . HEART MURMUR, HX OF 10/02/2010  . 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 Chronic neck pain 12/13/2010  . GERD (gastroesophageal reflux disease)   . 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 Cancer of kidney 10/07/2012    Followed per Dr Despina Pole, MD, urology, [place]   . Arthritis   . Critical lower limb ischemia    Past Surgical History  Procedure Laterality Date  . Left toe amputated  2006  . Bunionectomy  1980  . Goiter  removal  1997  . Stress cardiolite  06/18/2006  . Tranthoracic echocardiogram  06/18/2006  . Electrocardiogram  05/29/2006  . Cholecystectomy    . Rotator cuff repair  Left    Dr. Sharol Given  . Av fistula placement  03/13/2012    Procedure: ARTERIOVENOUS (AV) FISTULA CREATION;  Surgeon: Conrad [place], MD;  Location: [place];  Service: Vascular;  Laterality: Right;  . Eye surgery      bilateral cataract removal  . Esophagoscopy w/ botox injection  07/22/2012    Procedure: ESOPHAGOSCOPY WITH BOTOX INJECTION;  Surgeon: Rozetta Nunnery, MD;  Location: [place];  Service: ENT;  Laterality: N/A;  esophageal dilation  . Insertion of dialysis catheter N/A 02/05/2013    Procedure: INSERTION OF DIALYSIS CATHETER;  Surgeon: Angelia Mould, MD;  Location: [place];  Service: Vascular;  Laterality: N/A;  Ultrasound guided  . Toe amputation Right Aug. 2015    2nd     reports that she has quit smoking. Her smoking use included Cigarettes. She has a 11.75 pack-year smoking history. She has never used smokeless tobacco. She reports that she does not drink alcohol or use illicit drugs. family history includes Coronary artery disease in her other; Dementia in her mother; Heart attack in her brother; Hyperlipidemia in her other; Hypertension in her brother, mother, other, and sister; Ovarian cancer in her other; Stroke in her  brother and other. Allergies  Allergen Reactions  . Cephalosporins Itching and Rash    Vanc and fortaz given at the same time in June for several doses at dialysis with systemic rash and itching; received zinacef 7/5 and had worseningsystemicrash/ itching and swelling of eyes - so unclear if allergic to either or both  . Nsaids     REACTION: renal dysfunction  . Pioglitazone     Actos REACTION: EDEMA  . Vancomycin     See comment under cephalosporin   Current Outpatient Prescriptions on File Prior to Visit  Medication Sig Dispense Refill  . acetaminophen (TYLENOL) 325  MG tablet Take 2 tablets (650 mg total) by mouth every 6 (six) hours as needed for mild pain (or Fever >/= 101).    Marland Kitchen albuterol (PROVENTIL HFA;VENTOLIN HFA) 108 (90 BASE) MCG/ACT inhaler Inhale 2 puffs into the lungs every 6 (six) hours as needed for wheezing or shortness of breath. 1 Inhaler 11  . allopurinol (ZYLOPRIM) 100 MG tablet Take 100 mg by mouth daily.     Marland Kitchen aspirin EC 81 MG tablet Take 81 mg by mouth daily.    Marland Kitchen atorvastatin (LIPITOR) 40 MG tablet Take 40 mg by mouth daily.    . benzonatate (TESSALON) 100 MG capsule Take 100-200 mg by mouth every 6 (six) hours as needed for cough.    . cinacalcet (SENSIPAR) 30 MG tablet Take 1 tablet (30 mg total) by mouth daily. 60 tablet 11  . clonazePAM (KLONOPIN) 0.5 MG tablet Take 1 tablet (0.5 mg total) by mouth 2 (two) times daily as needed (anxiety). 60 tablet 1  . colchicine 0.6 MG tablet Take 0.6 mg by mouth daily.     . cyclobenzaprine (FLEXERIL) 10 MG tablet Take 1 tablet (10 mg total) by mouth 3 (three) times daily as needed for muscle spasms. (Patient taking differently: Take 10 mg by mouth 3 (three) times daily. ) 30 tablet 2  . darbepoetin (ARANESP) 25 MCG/0.42ML SOLN injection Inject 25 mcg into the vein every 7 (seven) days. Every Thursday at dialysis    . docusate sodium (COLACE) 100 MG capsule Take 100 mg by mouth 2 (two) times daily.     Marland Kitchen doxercalciferol (HECTOROL) 4 MCG/2ML injection Inject 4 mcg into the vein Every Tuesday,Thursday,and Saturday with dialysis.    . DULoxetine (CYMBALTA) 30 MG capsule Take 2 capsules (60 mg total) by mouth daily. 60 capsule 11  . HYDROcodone-acetaminophen (NORCO/VICODIN) 5-325 MG per tablet Take 1 tablet by mouth every 6 (six) hours as needed for severe pain. 30 tablet 0  . hydrOXYzine (ATARAX/VISTARIL) 10 MG tablet Take 1 tablet (10 mg total) by mouth 3 (three) times daily as needed for anxiety. 30 tablet 0  . lactulose (CHRONULAC) 10 GM/15ML solution Take 20 g by mouth 2 (two) times daily.     Marland Kitchen  levofloxacin (LEVAQUIN) 500 MG tablet Take 1 tablet (500 mg total) by mouth every other day. 3 tablet 0  . levothyroxine (SYNTHROID, LEVOTHROID) 150 MCG tablet Take 1 tablet (150 mcg total) by mouth daily before breakfast. 30 tablet 2  . mometasone-formoterol (DULERA) 100-5 MCG/ACT AERO Inhale 2 puffs into the lungs 2 (two) times daily. 1 Inhaler 6  . montelukast (SINGULAIR) 10 MG tablet Take 10 mg by mouth at bedtime.    . multivitamin (RENA-VIT) TABS tablet Take 1 tablet by mouth daily.    Marland Kitchen omeprazole (PRILOSEC) 40 MG capsule Take 1 capsule (40 mg total) by mouth daily. 30 capsule 5  .  ondansetron (ZOFRAN) 4 MG tablet Take 4 mg by mouth every 6 (six) hours as needed for nausea or vomiting.    . polyethylene glycol (MIRALAX / GLYCOLAX) packet Take 17 g by mouth daily.    . pregabalin (LYRICA) 25 MG capsule Take 1 capsule (25 mg total) by mouth daily. 30 capsule 2  . QUEtiapine (SEROQUEL) 100 MG tablet Take 0.5 tablets (50 mg total) by mouth at bedtime. 30 tablet 0  . sevelamer carbonate (RENVELA) 800 MG tablet Take 2,400 mg by mouth 3 (three) times daily with meals.     No current facility-administered medications on file prior to visit.   Review of Systems  Constitutional: Negative for unusual diaphoresis or other sweats  HENT: Negative for ringing in ear Eyes: Negative for double vision or worsening visual disturbance.  Respiratory: Negative for choking and stridor.   Gastrointestinal: Negative for vomiting or other signifcant bowel change Genitourinary: Negative for hematuria or decreased urine volume.  Musculoskeletal: Negative for other MSK pain or swelling Skin: Negative for color change and worsening wound.  Neurological: Negative for tremors and numbness other than noted  Psychiatric/Behavioral: Negative for decreased concentration or agitation other than above       Objective:   Physical Exam BP 132/72 mmHg  Pulse 101  Temp(Src) 99 F (37.2 C) (Oral)  Ht 5\' 8"  (1.727 m)   Wt 153 lb 8 oz (69.627 kg)  BMI 23.34 kg/m2  SpO2 94% VS noted,  Constitutional: Pt appears well-developed, well-nourished.  HENT: Head: NCAT.  Right Ear: External ear normal.  Left Ear: External ear normal.  Eyes: . Pupils are equal, round, and reactive to light. Conjunctivae and EOM are normal Neck: Normal range of motion. Neck supple.  Cardiovascular: Normal rate and regular rhythm.   Pulmonary/Chest: Effort normal and breath sounds without rales or wheezing.  Abd:  Soft, NT, ND, + BS Neurological: Pt is alert. Not confused , motor grossly intact Skin: Skin is warm. No rash Psychiatric: Pt behavior is normal. No agitation.     Assessment & Plan:

## 2014-09-09 ENCOUNTER — Telehealth: Payer: Self-pay | Admitting: Internal Medicine

## 2014-09-09 NOTE — Telephone Encounter (Signed)
emmi emailed °

## 2014-09-10 ENCOUNTER — Ambulatory Visit: Payer: Medicare Other | Admitting: Podiatrist

## 2014-10-07 ENCOUNTER — Other Ambulatory Visit: Payer: Self-pay | Admitting: Internal Medicine

## 2014-10-13 ENCOUNTER — Other Ambulatory Visit: Payer: Self-pay | Admitting: Internal Medicine

## 2014-10-20 ENCOUNTER — Other Ambulatory Visit: Payer: Self-pay

## 2014-10-20 MED ORDER — ALBUTEROL SULFATE HFA 108 (90 BASE) MCG/ACT IN AERS: 2.0000 | INHALATION_SPRAY | Freq: Four times a day (QID) | RESPIRATORY_TRACT | Status: DC | PRN

## 2014-11-03 ENCOUNTER — Other Ambulatory Visit: Payer: Self-pay | Admitting: Internal Medicine

## 2014-11-03 NOTE — Telephone Encounter (Signed)
Done hardcopy to [facility] - the klonopin  cymbalta  done erx

## 2014-11-03 NOTE — Telephone Encounter (Signed)
Rx done. 

## 2014-11-05 ENCOUNTER — Telehealth: Payer: Self-pay | Admitting: *Deleted

## 2014-11-05 NOTE — Telephone Encounter (Signed)
Pt called to schedule an appt.

## 2014-11-08 NOTE — Telephone Encounter (Signed)
Patient is scheduled 05.05.2016 with Dr. Valentina Lucks.

## 2014-11-11 ENCOUNTER — Telehealth: Payer: Self-pay | Admitting: Internal Medicine

## 2014-11-11 NOTE — Telephone Encounter (Signed)
Called [employer] back spoke with PA rep gave her additional information for the PA [facility]. Med has been approved. She stated will fax over approval letter and notify pt that med was approved...Johny Chess

## 2014-11-11 NOTE — Telephone Encounter (Signed)
Need to clarify her diagnoses for a PA

## 2014-11-24 ENCOUNTER — Ambulatory Visit: Payer: Medicare Other | Admitting: Internal Medicine

## 2014-12-07 ENCOUNTER — Ambulatory Visit: Payer: Medicare Other

## 2014-12-08 ENCOUNTER — Encounter: Payer: Self-pay | Admitting: Internal Medicine

## 2014-12-09 ENCOUNTER — Ambulatory Visit: Payer: Medicare Other

## 2014-12-17 ENCOUNTER — Encounter: Payer: Self-pay | Admitting: Physician Assistant

## 2014-12-17 ENCOUNTER — Ambulatory Visit (INDEPENDENT_AMBULATORY_CARE_PROVIDER_SITE_OTHER): Payer: Medicare Other | Admitting: Physician Assistant

## 2014-12-17 ENCOUNTER — Telehealth: Payer: Self-pay | Admitting: Physician Assistant

## 2014-12-17 VITALS — BP 132/70 | HR 66 | Ht 67.5 in | Wt 154.0 lb

## 2014-12-17 DIAGNOSIS — Z1211 Encounter for screening for malignant neoplasm of colon: Secondary | ICD-10-CM | POA: Diagnosis not present

## 2014-12-17 DIAGNOSIS — Z86010 Personal history of colonic polyps: Secondary | ICD-10-CM | POA: Diagnosis not present

## 2014-12-17 DIAGNOSIS — R112 Nausea with vomiting, unspecified: Secondary | ICD-10-CM

## 2014-12-17 MED ORDER — OMEPRAZOLE 40 MG PO CPDR: DELAYED_RELEASE_CAPSULE | ORAL | Status: DC

## 2014-12-17 MED ORDER — MOVIPREP 100 G PO SOLR: 1.0000 | ORAL | Status: DC

## 2014-12-17 NOTE — Patient Instructions (Signed)
We sent prescriptions to [facility]. 1. Pepcid 40 mg.  2. Moviprep colonoscopy prep  You have been scheduled for an endoscopy and colonoscopy. Please follow the written instructions given to you at your visit today. Please pick up your prep supplies at the pharmacy within the next 1-3 days. [place] If you use inhalers (even only as needed), please bring them with you on the day of your procedure. Your physician has requested that you go to www.startemmi.com and enter the access code given to you at your visit today. This web site gives a general overview about your procedure. However, you should still follow specific instructions given to you by our office regarding your preparation for the procedure.

## 2014-12-19 NOTE — Progress Notes (Signed)
Patient ID: [name] [name], female   DOB: 02-09-1941, 74 y.o.   MRN: [id number]   Subjective:    Patient ID: [name] Hay Rottmann, female    DOB: [birth year], 74 y.o.   MRN: [id number]  HPI Joslin is a 73 year old African-American female known to Dr. Ardis Hughs. She was last seen in the office by myself in February 2015 with similar complaints of nausea and intermittent vomiting. At that time she declined to schedule an EGD and was thinking about returning to the care of Dr. Collene Mares. She comes back to the office today, referred by Dr. Marval Regal  for evaluation of nausea vomiting and weight loss. Patient has adult-onset diabetes mellitus, depression and restless leg syndrome history of seizures, hypertension, memory loss, chronic kidney disease with renal failure on dialysis and history of renal cancer. She also has COPD not O2 dependent. She is status post cholecystectomy. Patient last had colonoscopy in April 2011 per Dr. Ardis Hughs she had 3 polyps at that time removed all of which were tubular adenomas and she was to follow-up in 5 years.   Patient relates constant nausea over the past year or so and intermittent weak spells. She says she generally feels nauseated all day long and vomits intermittently. Her appetite is very poor however according to our scales her weight is stable over the past year. She was 154 today and weighed 146 in February 2015. No specific complaints of abdominal pain no problems with constipation diarrhea melena or hematochezia. She is using Zofran regularly but says she generally just tries to take 1 a day. She also takes Pepto-Bismol and omeprazole to help with her nausea. She is on multiple medications including hydrocodone and psychotropics.  Review of Systems Pertinent positive and negative review of systems were noted in the above HPI section.  All other review of systems was otherwise negative.  Outpatient Encounter Prescriptions as of 12/17/2014  Medication Sig  . acetaminophen (TYLENOL)  325 MG tablet Take 2 tablets (650 mg total) by mouth every 6 (six) hours as needed for mild pain (or Fever >/= 101).  Marland Kitchen albuterol (PROVENTIL HFA;VENTOLIN HFA) 108 (90 BASE) MCG/ACT inhaler Inhale 2 puffs into the lungs every 6 (six) hours as needed for wheezing or shortness of breath.  . allopurinol (ZYLOPRIM) 100 MG tablet Take 100 mg by mouth daily.   Marland Kitchen aspirin EC 81 MG tablet Take 81 mg by mouth daily.  Marland Kitchen atorvastatin (LIPITOR) 40 MG tablet Take 40 mg by mouth daily.  . benzonatate (TESSALON) 100 MG capsule Take 100-200 mg by mouth every 6 (six) hours as needed for cough.  . cinacalcet (SENSIPAR) 30 MG tablet Take 1 tablet (30 mg total) by mouth daily.  . clonazePAM (KLONOPIN) 0.5 MG tablet TAKE ONE TABLET BY MOUTH TWICE DAILY AS NEEDED FOR ANXIETY  . colchicine 0.6 MG tablet Take 0.6 mg by mouth daily.   . cyclobenzaprine (FLEXERIL) 10 MG tablet TAKE ONE TABLET BY MOUTH THREE TIMES DAILY AS NEEDED FOR MUSCLE SPASM  . darbepoetin (ARANESP) 25 MCG/0.42ML SOLN injection Inject 25 mcg into the vein every 7 (seven) days. Every Thursday at dialysis  . docusate sodium (COLACE) 100 MG capsule Take 100 mg by mouth 2 (two) times daily.   Marland Kitchen doxercalciferol (HECTOROL) 4 MCG/2ML injection Inject 4 mcg into the vein Every Tuesday,Thursday,and Saturday with dialysis.  . DULoxetine (CYMBALTA) 30 MG capsule Take 2 capsules (60 mg total) by mouth daily.  Marland Kitchen HYDROcodone-acetaminophen (NORCO/VICODIN) 5-325 MG per tablet Take 1 tablet by mouth every  6 (six) hours as needed for severe pain.  . hydrOXYzine (ATARAX/VISTARIL) 10 MG tablet Take 1 tablet (10 mg total) by mouth 3 (three) times daily as needed for anxiety. (Patient taking differently: Take 25 mg by mouth 3 (three) times daily as needed for anxiety. )  . lactulose (CHRONULAC) 10 GM/15ML solution TAKE 30 ML BY MOUTH 3 TIMES DAILY  . levofloxacin (LEVAQUIN) 500 MG tablet Take 1 tablet (500 mg total) by mouth every other day.  . levothyroxine (SYNTHROID,  LEVOTHROID) 150 MCG tablet Take 1 tablet (150 mcg total) by mouth daily before breakfast.  . mometasone-formoterol (DULERA) 100-5 MCG/ACT AERO Inhale 2 puffs into the lungs 2 (two) times daily.  . montelukast (SINGULAIR) 10 MG tablet Take 10 mg by mouth at bedtime.  . multivitamin (RENA-VIT) TABS tablet Take 1 tablet by mouth daily.  Marland Kitchen omeprazole (PRILOSEC) 40 MG capsule Take 1 capsule twice daily.  . ondansetron (ZOFRAN) 4 MG tablet Take 4 mg by mouth every 6 (six) hours as needed for nausea or vomiting.  . pregabalin (LYRICA) 25 MG capsule Take 1 capsule (25 mg total) by mouth daily. (Patient taking differently: Take 75 mg by mouth daily. )  . QUEtiapine (SEROQUEL) 100 MG tablet Take 0.5 tablets (50 mg total) by mouth at bedtime. (Patient taking differently: Take 100 mg by mouth at bedtime. )  . sevelamer carbonate (RENVELA) 800 MG tablet Take 2,400 mg by mouth 3 (three) times daily with meals.  . [DISCONTINUED] omeprazole (PRILOSEC) 40 MG capsule Take 1 capsule (40 mg total) by mouth daily.  Marland Kitchen MOVIPREP 100 G SOLR Take 1 kit (200 g total) by mouth as directed.  . [DISCONTINUED] DULoxetine (CYMBALTA) 30 MG capsule TAKE ONE CAPSULE BY MOUTH ONCE DAILY  . [DISCONTINUED] lactulose (CHRONULAC) 10 GM/15ML solution Take 20 g by mouth 2 (two) times daily.   . [DISCONTINUED] polyethylene glycol (MIRALAX / GLYCOLAX) packet Take 17 g by mouth daily.   No facility-administered encounter medications on file as of 12/17/2014.   Allergies  Allergen Reactions  . Cephalosporins Itching and Rash    Vanc and fortaz given at the same time in June for several doses at dialysis with systemic rash and itching; received zinacef 7/5 and had worseningsystemicrash/ itching and swelling of eyes - so unclear if allergic to either or both  . Nsaids     REACTION: renal dysfunction  . Pioglitazone     Actos REACTION: EDEMA  . Vancomycin     See comment under cephalosporin   Patient Active Problem List   Diagnosis Date  Noted  . Thrombocytopenia 08/04/2014  . Acute encephalopathy 08/03/2014  . Acute bronchitis 05/26/2014  . Non-compliant behavior 05/20/2014  . Pain of right upper arm 04/30/2014  . Other complications due to renal dialysis device, implant, and graft 04/30/2014  . Hyperparathyroidism, secondary renal 04/12/2014  . Hyperkalemia 03/30/2014  . Hypoglycemia 03/30/2014  . ESRD on hemodialysis 03/30/2014  . Anemia of renal disease 03/30/2014  . Peripheral neuropathy 12/12/2013  . Hives 02/09/2013  . Thinning of skin 02/04/2013  . 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 Unspecified constipation 01/02/2013  . Muscle spasm 01/02/2013  . COPD (chronic obstructive pulmonary disease) 01/02/2013  . Cancer of kidney 10/07/2012  . Insomnia 10/07/2012  . Dysuria 01/24/2012  . N&V (nausea and vomiting) 01/24/2012  . Generalized weakness 08/17/2011  . Gait instability 08/17/2011  . UTI (lower urinary tract infection) 08/17/2011  . Renal failure (ARF), acute on chronic 08/17/2011  . Bilateral knee  pain 04/20/2011  . 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 Chronic neck pain 12/13/2010  . Osteopenia 12/13/2010  . Preventative health care 12/12/2010  . HEART MURMUR, HX OF 10/02/2010  . Palpitations 09/08/2010  . HYPERSOMNIA 05/26/2010  . ANXIETY 03/23/2010  . Memory loss 01/24/2010  . BACK PAIN 12/12/2009  . Personal history of colonic polyps 11/16/2009  . MENOPAUSAL DISORDER 05/25/2009  . SHOULDER PAIN, LEFT, CHRONIC 09/15/2008  . CERVICAL RADICULOPATHY, LEFT 05/27/2008  . Osteoarth NOS-L/Leg 03/11/2008  . 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 FATIGUE 09/17/2007  . Abnormality of gait 07/30/2007  . Gout 05/29/2007  . RESTLESS LEG SYNDROME 05/29/2007  . COMMON MIGRAINE 05/29/2007  . PERIPHERAL NEUROPATHY 05/29/2007  . Asthmatic bronchitis , chronic 05/29/2007  . Peptic ulcer 05/29/2007  . Hypothyroidism 02/17/2007  . Impaired glucose tolerance 02/17/2007  . HLD (hyperlipidemia)  02/17/2007  . DEPRESSION 02/17/2007  . Essential hypertension 02/17/2007  . Peripheral vascular disease 02/17/2007  . ALLERGIC RHINITIS 02/17/2007  . GERD 02/17/2007  . Seizure 02/17/2007   History   Social History  . Marital Status: Single    Spouse Name: N/A  . Number of Children: N/A  . Years of Education: N/A   Occupational History  . disabled     c-spine and back pain   Social History Main Topics  . Smoking status: Former Smoker -- 0.25 packs/day for 47 years    Types: Cigarettes  . Smokeless tobacco: Never Used     Comment: USING E CIG  . Alcohol Use: No  . Drug Use: No  . Sexual Activity: Not on file   Other Topics Concern  . Not on file   Social History Narrative   Currently on disability for her back pain.  Lives alone, though has been thinking about moving to ALF/SNF recently.    Ms. Mourer family history includes Coronary artery disease in her other; Dementia in her mother; Heart attack in her brother; Hyperlipidemia in her other; Hypertension in her brother, mother, other, and sister; Ovarian cancer in her other; Stroke in her brother and other.      Objective:    Filed Vitals:   12/17/14 1036  BP: 132/70  Pulse: 66    Physical Exam  well-developed elderly African-American female chronically ill-appearing in no acute distress blood pressure 132/70 pulse 66 height 5 foot 7 weight 154. HEENT; nontraumatic normocephalic EOMI PERRLA sclera anicteric, Supple; no JVD, Cardiovascular; regular rate and rhythm with S1-S2 no murmur rub or gallop, Pulmonary; somewhat decreased breath sounds bilaterally, Abdomen ;soft no focal tenderness no guarding or rebound bowel sounds are present no succussion splash no palpable mass or hepatosplenomegaly, Rectal ;exam not done, Extremities; no clubbing cyanosis or edema, Neuropsych ;mood and affect appropriate       Assessment & Plan:   #1 74 yo female with chronic nausea and intermittent vomiting over more than one year-  however no weight loss by our records. Multiple possible etiologies including chronic renal failure, polypharmacy and gastroparesis. R/O PUD ,chronic gastritis #2 Renal failure-on dialysis #3 HTN #4 AODM #5 depression #6 PVD #7 GERD #8Memory loss #9 hx of adenomatous polyps- due for follow up #10- hx of renal CA  Plan; Use Zofran 4 mg  Qam, then q6-8 hours regularly Switch omeprazole to pepcid 40 mg po BID(pt concerned about side effects) Schedule for EGD and colonoscopy with  Dr Ardis Hughs- procedures discussed in detail with pt and she is agreeable. If above negative will need GE scan    Kimyetta Flott S  Tiffani Kadow PA-C 12/19/2014   Cc: Biagio Borg, MD

## 2014-12-20 ENCOUNTER — Other Ambulatory Visit: Payer: Self-pay | Admitting: *Deleted

## 2014-12-20 MED ORDER — FAMOTIDINE 40 MG PO TABS: 40.0000 mg | ORAL_TABLET | Freq: Two times a day (BID) | ORAL | Status: DC

## 2014-12-20 NOTE — Progress Notes (Signed)
i agree with the above note, plan 

## 2014-12-20 NOTE — Telephone Encounter (Signed)
Sent prescription for the Pepcid 40 mg, twice daily, # 60 with 11 refills. Pharmacy: [facility].  Called patient to inform her.

## 2014-12-20 NOTE — Telephone Encounter (Signed)
I spoke to [name] [name] and told [name] [name] since she picked up the Omeprazole 40 mg twice daily, [name] [name] said to finish that medication and then she can get the pepcid 40 mg.  I thought I sent Pepcid on Friday 12-17-14 but I sent Omeperazole again.  [name] [name] was only taking it once daily over the weekend and I urged [name] [name] to take it twice daily.  Also to still take the Zofran 4 mg , 1 tab every 6 hours as needed.

## 2014-12-26 ENCOUNTER — Other Ambulatory Visit: Payer: Self-pay | Admitting: Internal Medicine

## 2015-01-07 ENCOUNTER — Telehealth: Payer: Self-pay | Admitting: Gastroenterology

## 2015-01-07 NOTE — Telephone Encounter (Signed)
Returned phone call and went over  Prep instructions with the patient. Verbalized understanding.

## 2015-01-10 ENCOUNTER — Encounter: Payer: Self-pay | Admitting: Gastroenterology

## 2015-01-10 ENCOUNTER — Ambulatory Visit (AMBULATORY_SURGERY_CENTER): Payer: Medicare Other | Admitting: Gastroenterology

## 2015-01-10 VITALS — BP 116/63 | HR 81 | Temp 96.9°F | Resp 16 | Ht 67.0 in | Wt 154.0 lb

## 2015-01-10 DIAGNOSIS — R112 Nausea with vomiting, unspecified: Secondary | ICD-10-CM | POA: Diagnosis present

## 2015-01-10 DIAGNOSIS — D125 Benign neoplasm of sigmoid colon: Secondary | ICD-10-CM

## 2015-01-10 DIAGNOSIS — Z860100 Personal history of colon polyps, unspecified: Secondary | ICD-10-CM

## 2015-01-10 DIAGNOSIS — K2990 Gastroduodenitis, unspecified, without bleeding: Secondary | ICD-10-CM | POA: Diagnosis not present

## 2015-01-10 DIAGNOSIS — D122 Benign neoplasm of ascending colon: Secondary | ICD-10-CM

## 2015-01-10 DIAGNOSIS — K2970 Gastritis, unspecified, without bleeding: Secondary | ICD-10-CM

## 2015-01-10 MED ORDER — SODIUM CHLORIDE 0.9 % IV SOLN: 500.0000 mL | INTRAVENOUS | Status: DC

## 2015-01-10 NOTE — Op Note (Signed)
[facility]  [address]. [place], [address]   ENDOSCOPY PROCEDURE REPORT  PATIENT: [name], [name]  MR#: [id number] BIRTHDATE: Jan 14, 1941 , 74  yrs. old GENDER: female ENDOSCOPIST: Milus Banister, MD PROCEDURE DATE:  01/10/2015 PROCEDURE:  EGD w/ biopsy ASA CLASS:     Class III INDICATIONS:  chronic nausea. MEDICATIONS: Monitored anesthesia care and Propofol 40 mg IV TOPICAL ANESTHETIC: none  DESCRIPTION OF PROCEDURE: After the risks benefits and alternatives of the procedure were thoroughly explained, informed consent was obtained.  The [id number] endoscope was introduced through the mouth and advanced to the second portion of the duodenum , Without limitations.  The instrument was slowly withdrawn as the mucosa was fully examined.  There was moderate non-specific pangastritis.  This was biopsied distally and sent to pathology.  There was mild bulbar duodenitis and a small soft, yellow lipoma in second duodenum.  There was a small hiatal hernia.  The examination was otherwise normal. Retroflexed views revealed no abnormalities.     The scope was then withdrawn from the patient and the procedure completed. COMPLICATIONS: There were no immediate complications.  ENDOSCOPIC IMPRESSION: There was moderate non-specific pangastritis.  This was biopsied distally and sent to pathology.  There was mild bulbar duodenitis and a small soft, yellow lipoma in second duodenum.  There was a small hiatal hernia.  The examination was otherwise normal  RECOMMENDATIONS: If biopsies show H.  pylori, you will be started on appropriate antibiotics.  If not, will consider further testing (?Gastric emptying scan).   eSigned:  Milus Banister, MD 01/10/2015 2:57 PM    CC: Markus Jarvis, MD

## 2015-01-10 NOTE — Progress Notes (Signed)
Report to PACU, RN, vss, BBS= Clear.  

## 2015-01-10 NOTE — Patient Instructions (Signed)

## 2015-01-10 NOTE — Op Note (Signed)
[facility]  [address] [place], [id number]   COLONOSCOPY PROCEDURE REPORT  PATIENT: [name], [name] Costilow  MR#: [id number] BIRTHDATE: 03-Mar-1941 , 74  yrs. old GENDER: female ENDOSCOPIST: Milus Banister, MD PROCEDURE DATE:  01/10/2015 PROCEDURE:   Colonoscopy, surveillance and Colonoscopy with snare polypectomy First Screening Colonoscopy - Avg.  risk and is 50 yrs.  old or older - No.  Prior Negative Screening - Now for repeat screening. N/A  History of Adenoma - Now for follow-up colonoscopy & has been > or = to 3 yrs.  Yes hx of adenoma.  Has been 3 or more years since last colonoscopy.  Polyps removed today? Yes ASA CLASS:   Class III INDICATIONS:Surveillance due to prior colonic neoplasia and colonoscopy in April 2011 per Dr.  Ardis Hughs she had 3 polyps at that time removed all of which were tubular adenomas and she was to follow-up in 5 years.Marland Kitchen MEDICATIONS: Monitored anesthesia care and Propofol 200 mg IV  DESCRIPTION OF PROCEDURE:   After the risks benefits and alternatives of the procedure were thoroughly explained, informed consent was obtained.  The digital rectal exam revealed no abnormalities of the rectum.   The LB Olympus Loaner [id number] endoscope was introduced through the anus and advanced to the cecum, which was identified by both the appendix and ileocecal valve. No adverse events experienced.   The quality of the prep was good.  The instrument was then slowly withdrawn as the colon was fully examined. Estimated blood loss is zero unless otherwise noted in this procedure report.   COLON FINDINGS: Three sessile polyps ranging between 3-33m in size were found in the sigmoid colon and ascending colon.  Polypectomies were performed with a cold snare.  The resection was complete, the polyp tissue was completely retrieved and sent to histology.   The examination was otherwise normal.  Retroflexed views revealed no abnormalities. The time to cecum = 5.8  Withdrawal time = 9.2   The scope was withdrawn and the procedure completed. COMPLICATIONS: There were no immediate complications.  ENDOSCOPIC IMPRESSION: 1.  Three sessile polyps ranging between 3-798min size were found in the sigmoid colon and ascending colon; polypectomies were performed with a cold snare 2.   The examination was otherwise normal  RECOMMENDATIONS: If the polyp(s) removed today are proven to be adenomatous (pre-cancerous) polyps, you will need a colonoscopy in 3-5 years. You will receive a letter within 1-2 weeks with the results of your biopsy as well as final recommendations.  Please call my office if you have not received a letter after 3 weeks.  eSigned:  DaMilus BanisterMD 01/10/2015 2:53 PM   cc: JoMarkus JarvisMD

## 2015-01-10 NOTE — Progress Notes (Signed)
Called to room to assist during endoscopic procedure.  Patient ID and intended procedure confirmed with present staff. Received instructions for my participation in the procedure from the performing physician.  

## 2015-01-11 ENCOUNTER — Telehealth: Payer: Self-pay

## 2015-01-11 NOTE — Telephone Encounter (Signed)
Left message on answering machine. 

## 2015-01-19 ENCOUNTER — Other Ambulatory Visit: Payer: Self-pay

## 2015-01-19 DIAGNOSIS — R112 Nausea with vomiting, unspecified: Secondary | ICD-10-CM

## 2015-01-21 ENCOUNTER — Other Ambulatory Visit: Payer: Self-pay | Admitting: Internal Medicine

## 2015-01-26 ENCOUNTER — Other Ambulatory Visit: Payer: Self-pay | Admitting: Internal Medicine

## 2015-01-26 NOTE — Telephone Encounter (Signed)
Done hardcopy to steph 

## 2015-01-28 NOTE — Telephone Encounter (Signed)
rx faxed

## 2015-02-04 ENCOUNTER — Other Ambulatory Visit: Payer: Self-pay | Admitting: Internal Medicine

## 2015-02-08 ENCOUNTER — Ambulatory Visit (HOSPITAL_COMMUNITY): Payer: Medicare Other

## 2015-02-14 ENCOUNTER — Ambulatory Visit (HOSPITAL_COMMUNITY)
Admission: RE | Admit: 2015-02-14 | Discharge: 2015-02-14 | Disposition: A | Discharge status: Home / Self Care | Payer: Medicare Other | Source: Ambulatory Visit | Attending: Gastroenterology | Admitting: Gastroenterology

## 2015-02-14 DIAGNOSIS — R112 Nausea with vomiting, unspecified: Secondary | ICD-10-CM | POA: Diagnosis not present

## 2015-02-14 MED ORDER — TECHNETIUM TC 99M SULFUR COLLOID
2.0000 | Freq: Once | INTRAVENOUS | Status: AC | PRN
  Administered 2015-02-14: 2 via ORAL

## 2015-02-15 ENCOUNTER — Other Ambulatory Visit: Payer: Self-pay | Admitting: Internal Medicine

## 2015-02-21 ENCOUNTER — Telehealth: Payer: Self-pay | Admitting: Geriatric Medicine

## 2015-02-21 NOTE — Telephone Encounter (Signed)
Patient had mammogram 3 months ago at [facility].

## 2015-02-23 ENCOUNTER — Other Ambulatory Visit: Payer: Self-pay | Admitting: Internal Medicine

## 2015-02-25 ENCOUNTER — Ambulatory Visit: Payer: Medicare Other | Admitting: Internal Medicine

## 2015-03-01 ENCOUNTER — Other Ambulatory Visit (INDEPENDENT_AMBULATORY_CARE_PROVIDER_SITE_OTHER): Payer: Medicare Other

## 2015-03-01 ENCOUNTER — Encounter: Payer: Self-pay | Admitting: Internal Medicine

## 2015-03-01 ENCOUNTER — Ambulatory Visit (INDEPENDENT_AMBULATORY_CARE_PROVIDER_SITE_OTHER): Payer: Medicare Other | Admitting: Internal Medicine

## 2015-03-01 VITALS — BP 138/60 | HR 100 | Temp 98.8°F | Wt 147.0 lb

## 2015-03-01 DIAGNOSIS — E785 Hyperlipidemia, unspecified: Secondary | ICD-10-CM

## 2015-03-01 DIAGNOSIS — R7302 Impaired glucose tolerance (oral): Secondary | ICD-10-CM

## 2015-03-01 DIAGNOSIS — J449 Chronic obstructive pulmonary disease, unspecified: Secondary | ICD-10-CM | POA: Diagnosis not present

## 2015-03-01 DIAGNOSIS — E039 Hypothyroidism, unspecified: Secondary | ICD-10-CM

## 2015-03-01 LAB — LIPID PANEL
CHOL/HDL RATIO: 2
Cholesterol: 139 mg/dL (ref 0–200)
HDL: 61.9 mg/dL (ref >39.00)
LDL CALC: 42 mg/dL (ref 0–99)
NonHDL: 77.1
Triglycerides: 178 mg/dL — ABNORMAL HIGH (ref 0.0–149.0)
VLDL: 35.6 mg/dL (ref 0.0–40.0)

## 2015-03-01 LAB — TSH: TSH: 7.33 u[IU]/mL — ABNORMAL HIGH (ref 0.35–4.50)

## 2015-03-01 LAB — HEMOGLOBIN A1C: HEMOGLOBIN A1C: 5 % (ref 4.6–6.5)

## 2015-03-01 MED ORDER — ALBUTEROL SULFATE HFA 108 (90 BASE) MCG/ACT IN AERS: 2.0000 | INHALATION_SPRAY | Freq: Four times a day (QID) | RESPIRATORY_TRACT | Status: DC | PRN

## 2015-03-01 MED ORDER — CLONAZEPAM 2 MG PO TABS: 2.0000 mg | ORAL_TABLET | Freq: Two times a day (BID) | ORAL | Status: DC

## 2015-03-01 NOTE — Patient Instructions (Signed)
Please take all new medication as prescribed - the klonopin 2 mg  Please continue all other medications as before, and refills have been done if requested - the proventil  Please have the pharmacy call with any other refills you may need.  Please continue your efforts at being more active, low cholesterol diet, and weight control.  You are otherwise up to date with prevention measures today.  Please keep your appointments with your specialists as you may have planned  Please go to the LAB in the Basement (turn left off the elevator) for the tests to be done today  You will be contacted by phone if any changes need to be made immediately.  Otherwise, you will receive a letter about your results with an explanation, but please check with MyChart first.  Please remember to sign up for MyChart if you have not done so, as this will be important to you in the future with finding out test results, communicating by private email, and scheduling acute appointments online when needed.  Please return in 6 months, or sooner if needed

## 2015-03-01 NOTE — Assessment & Plan Note (Signed)
stable overall by history and exam, recent data reviewed with pt, and pt to continue medical treatment as before,  to f/u any worsening symptoms or concerns Lab Results  Component Value Date   HGBA1C 6.1* 03/31/2014    

## 2015-03-01 NOTE — Assessment & Plan Note (Signed)
stable overall by history and exam, recent data reviewed with pt, and pt to continue medical treatment as before,  to f/u any worsening symptoms or concerns Lab Results  Component Value Date   LDLCALC 59 10/14/2013   For f/u lipid, low chol diet

## 2015-03-01 NOTE — Progress Notes (Signed)
Subjective:    Patient ID: [name] [name], female    DOB: 01-17-1941, 74 y.o.   MRN: [id number]  HPI   Here for yearly f/u;  Overall doing ok;  Pt denies Chest pain, worsening SOB, DOE, wheezing, orthopnea, PND, worsening LE edema, palpitations, dizziness or syncope.  Pt denies neurological change such as new headache, facial or extremity weakness.  Pt denies polydipsia, polyuria, or low sugar symptoms. Pt states overall good compliance with treatment and medications, good tolerability, and has been trying to follow appropriate diet.  Pt denies worsening depressive symptoms, suicidal ideation or panic. No fever, night sweats, wt loss, loss of appetite, or other constitutional symptoms.  Pt states good ability with ADL's, has low fall risk, home safety reviewed and adequate, no other significant changes in hearing or vision, and only occasionally active with exercise.  Had signficiant nausea with jun 6 egd/colonospcy neg for acute, s/p 3 polyp with f/u sched for 3 yrs.  Nausea now improved, etiology not clear. BP Readings from Last 3 Encounters:  03/01/15 138/60  01/10/15 116/63  12/17/14 132/70  Remains on dialysis HD - T-Th-Sat.  Not currently being seen per psychiatry, but plans to re-establish. Denies worsening depressive symptoms, suicidal ideation, or panic; has ongoing anxiety.   Denies hyper or hypo thyroid symptoms such as voice, skin or hair change. Wt Readings from Last 3 Encounters:  03/01/15 147 lb (66.679 kg)  01/10/15 154 lb (69.854 kg)  12/17/14 154 lb (69.854 kg)    Past Medical History  Diagnosis Date  . HYPOTHYROIDISM 02/17/2007    s/p surgical removal of goiter in 1997  . DIABETES MELLITUS, TYPE II 02/17/2007  . HYPERLIPIDEMIA 02/17/2007  . GOUT 05/29/2007  . ANEMIA-NOS 05/29/2007  . ANXIETY 03/23/2010  . 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 DEPRESSION 02/17/2007  . RESTLESS LEG SYNDROME 05/29/2007  . COMMON MIGRAINE 05/29/2007  . PERIPHERAL NEUROPATHY 05/29/2007  . HYPERTENSION  02/17/2007  . PERIPHERAL VASCULAR DISEASE 02/17/2007  . PEPTIC ULCER DISEASE 05/29/2007  . ESRD on hemodialysis 02/17/2007    Started dialysis April 2014.  Gets HD at [facility] on TTS schedule.  Cause of ESRD was HTN.     . OSTEOPENIA 09/22/2009  . SEIZURE DISORDER 02/17/2007  . Memory loss 01/24/2010  . Personal history of colonic polyps 11/16/2009  . Palpitations 09/08/2010  . Other diseases of vocal cords 1996  . HEART MURMUR, HX OF 10/02/2010  . 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 Chronic neck pain 12/13/2010  . GERD (gastroesophageal reflux disease)   . 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 Cancer of kidney 10/07/2012    Followed per Dr Despina Pole, MD, urology, [place]   . Arthritis   . Critical lower limb ischemia    Past Surgical History  Procedure Laterality Date  . Left toe amputated  2006  . Bunionectomy  1980  . Goiter removal  1997  . Stress cardiolite  06/18/2006  . Tranthoracic echocardiogram  06/18/2006  . Electrocardiogram  05/29/2006  . Cholecystectomy    . Rotator cuff repair  Left    Dr. Sharol Given  . Av fistula placement  03/13/2012    Procedure: ARTERIOVENOUS (AV) FISTULA CREATION;  Surgeon: Conrad St. Stephens, MD;  Location: [facility];  Service: Vascular;  Laterality: Right;  . Eye surgery      bilateral cataract removal  . Esophagoscopy w/ botox injection  07/22/2012    Procedure: ESOPHAGOSCOPY WITH BOTOX  INJECTION;  Surgeon: Rozetta Nunnery, MD;  Location: [facility];  Service: ENT;  Laterality: N/A;  esophageal dilation  . Insertion of dialysis catheter N/A 02/05/2013    Procedure: INSERTION OF DIALYSIS CATHETER;  Surgeon: Angelia Mould, MD;  Location: [facility];  Service: Vascular;  Laterality: N/A;  Ultrasound guided  . Toe amputation Right Aug. 2015    2nd     reports that she has quit smoking. Her smoking use included Cigarettes. She has a 11.75 pack-year smoking history. She has never used smokeless  tobacco. She reports that she does not drink alcohol or use illicit drugs. family history includes Coronary artery disease in her other; Dementia in her mother; Heart attack in her brother; Hyperlipidemia in her other; Hypertension in her brother, mother, other, and sister; Ovarian cancer in her other; Stroke in her brother and other. Allergies  Allergen Reactions  . Nsaids     REACTION: renal dysfunction  . Vancomycin     See comment under cephalosporin   Current Outpatient Prescriptions on File Prior to Visit  Medication Sig Dispense Refill  . acetaminophen (TYLENOL) 325 MG tablet Take 2 tablets (650 mg total) by mouth every 6 (six) hours as needed for mild pain (or Fever >/= 101).    Marland Kitchen albuterol (PROVENTIL HFA;VENTOLIN HFA) 108 (90 BASE) MCG/ACT inhaler Inhale 2 puffs into the lungs every 6 (six) hours as needed for wheezing or shortness of breath. 1 Inhaler 11  . allopurinol (ZYLOPRIM) 100 MG tablet Take 100 mg by mouth daily.     Marland Kitchen aspirin EC 81 MG tablet Take 81 mg by mouth daily.    Marland Kitchen atorvastatin (LIPITOR) 40 MG tablet Take 40 mg by mouth daily.    . benzonatate (TESSALON) 100 MG capsule Take 100-200 mg by mouth every 6 (six) hours as needed for cough.    . cinacalcet (SENSIPAR) 30 MG tablet Take 1 tablet (30 mg total) by mouth daily. 60 tablet 11  . colchicine 0.6 MG tablet Take 0.6 mg by mouth daily.     . cyclobenzaprine (FLEXERIL) 10 MG tablet TAKE ONE TABLET BY MOUTH THREE TIMES DAILY AS NEEDED FOR MUSCLE SPASMS 30 tablet 0  . darbepoetin (ARANESP) 25 MCG/0.42ML SOLN injection Inject 25 mcg into the vein every 7 (seven) days. Every Thursday at dialysis    . docusate sodium (COLACE) 100 MG capsule Take 100 mg by mouth 2 (two) times daily.     Marland Kitchen doxercalciferol (HECTOROL) 4 MCG/2ML injection Inject 4 mcg into the vein Every Tuesday,Thursday,and Saturday with dialysis.    . DULoxetine (CYMBALTA) 30 MG capsule Take 2 capsules (60 mg total) by mouth daily. 60 capsule 11  . famotidine  (PEPCID) 40 MG tablet Take 1 tablet (40 mg total) by mouth 2 (two) times daily. 60 tablet 11  . HYDROcodone-acetaminophen (NORCO/VICODIN) 5-325 MG per tablet Take 1 tablet by mouth every 6 (six) hours as needed for severe pain. 30 tablet 0  . hydrOXYzine (ATARAX/VISTARIL) 10 MG tablet Take 1 tablet (10 mg total) by mouth 3 (three) times daily as needed for anxiety. (Patient taking differently: Take 25 mg by mouth 3 (three) times daily as needed for anxiety. ) 30 tablet 0  . lactulose (CHRONULAC) 10 GM/15ML solution Take 30 mLs (20 g total) by mouth 3 (three) times daily. 473 mL 3  . levothyroxine (SYNTHROID, LEVOTHROID) 150 MCG tablet Take 1 tablet (150 mcg total) by mouth daily before breakfast. 30 tablet 2  . mometasone-formoterol (DULERA) 100-5  MCG/ACT AERO Inhale 2 puffs into the lungs 2 (two) times daily. 1 Inhaler 6  . montelukast (SINGULAIR) 10 MG tablet TAKE ONE TABLET BY MOUTH ONCE DAILY AT BEDTIME 30 tablet 5  . multivitamin (RENA-VIT) TABS tablet Take 1 tablet by mouth daily.    Marland Kitchen omeprazole (PRILOSEC) 40 MG capsule Take 1 capsule twice daily. 60 capsule 11  . ondansetron (ZOFRAN) 4 MG tablet Take 4 mg by mouth every 6 (six) hours as needed for nausea or vomiting.    . pregabalin (LYRICA) 25 MG capsule Take 1 capsule (25 mg total) by mouth daily. (Patient taking differently: Take 75 mg by mouth daily. ) 30 capsule 2  . QUEtiapine (SEROQUEL) 100 MG tablet Take 0.5 tablets (50 mg total) by mouth at bedtime. (Patient taking differently: Take 100 mg by mouth at bedtime. ) 30 tablet 0  . sevelamer carbonate (RENVELA) 800 MG tablet Take 2,400 mg by mouth 3 (three) times daily with meals.    . famotidine (PEPCID) 40 MG tablet Take 1 tablet (40 mg total) by mouth 2 (two) times daily. (Patient not taking: Reported on 01/10/2015) 60 tablet 6  . montelukast (SINGULAIR) 10 MG tablet Take 10 mg by mouth at bedtime.     No current facility-administered medications on file prior to visit.    Review of  Systems Constitutional: Negative for increased diaphoresis, other activity, appetite or siginficant weight change other than noted HENT: Negative for worsening hearing loss, ear pain, facial swelling, mouth sores and neck stiffness.   Eyes: Negative for other worsening pain, redness or visual disturbance.  Respiratory: Negative for shortness of breath and wheezing  Cardiovascular: Negative for chest pain and palpitations.  Gastrointestinal: Negative for diarrhea, blood in stool, abdominal distention or other pain Genitourinary: Negative for hematuria, flank pain or change in urine volume.  Musculoskeletal: Negative for myalgias or other joint complaints.  Skin: Negative for color change and wound or drainage.  Neurological: Negative for syncope and numbness. other than noted Hematological: Negative for adenopathy. or other swelling Psychiatric/Behavioral: Negative for hallucinations, SI, self-injury, decreased concentration or other worsening agitation.      Objective:   Physical Exam BP 138/60 mmHg  Pulse 100  Temp(Src) 98.8 F (37.1 C)  Wt 147 lb (66.679 kg)  SpO2 93% VS noted,  Constitutional: Pt is oriented to person, place, and time. Appears well-developed and well-nourished, in no significant distress Head: Normocephalic and atraumatic.  Right Ear: External ear normal.  Left Ear: External ear normal.  Nose: Nose normal.  Mouth/Throat: Oropharynx is clear and moist.  Eyes: Conjunctivae and EOM are normal. Pupils are equal, round, and reactive to light.  Neck: Normal range of motion. Neck supple. No JVD present. No tracheal deviation present or significant neck LA or mass Cardiovascular: Normal rate, regular rhythm, normal heart sounds and intact distal pulses.   Pulmonary/Chest: Effort normal and breath sounds without rales or wheezing  Abdominal: Soft. Bowel sounds are normal. NT. No HSM  Musculoskeletal: Normal range of motion. Exhibits no edema.  Lymphadenopathy:  Has no  cervical adenopathy.  Neurological: Pt is alert and oriented to person, place, and time. Pt has normal reflexes. No cranial nerve deficit. Motor grossly intact Skin: Skin is warm and dry. No rash noted.  Psychiatric:  Has normal mood and affect. Behavior is normal.         Assessment & Plan:

## 2015-03-01 NOTE — Assessment & Plan Note (Signed)
stable overall by history and exam, recent data reviewed with pt, and pt to continue medical treatment as before,  to f/u any worsening symptoms or concerns SpO2 Readings from Last 3 Encounters:  03/01/15 93%  01/10/15 96%  09/08/14 94%   For proventil prn refill

## 2015-03-01 NOTE — Assessment & Plan Note (Signed)
stable overall by history and exam, recent data reviewed with pt, and pt to continue medical treatment as before,  to f/u any worsening symptoms or concerns Lab Results  Component Value Date   TSH 10.537* 08/04/2014   To recheck , has been on 150 in the past, now 125 after reduced during a hospn

## 2015-03-01 NOTE — Progress Notes (Signed)
Pre visit review using our clinic review tool, if applicable. No additional management support is needed unless otherwise documented below in the visit note. 

## 2015-03-02 ENCOUNTER — Other Ambulatory Visit: Payer: Self-pay | Admitting: Internal Medicine

## 2015-03-02 ENCOUNTER — Encounter: Payer: Self-pay | Admitting: Internal Medicine

## 2015-03-02 MED ORDER — LEVOTHYROXINE SODIUM 175 MCG PO TABS: 175.0000 ug | ORAL_TABLET | Freq: Every day | ORAL | Status: AC

## 2015-03-09 ENCOUNTER — Ambulatory Visit: Payer: Medicare Other | Admitting: Internal Medicine

## 2015-03-21 ENCOUNTER — Other Ambulatory Visit: Payer: Self-pay | Admitting: Internal Medicine

## 2015-03-22 ENCOUNTER — Telehealth: Payer: Self-pay | Admitting: Internal Medicine

## 2015-03-22 NOTE — Telephone Encounter (Signed)
[employer] called and was wanting to know if you received the request for a back brace for pt. Their number is [phone] Ref [phone]

## 2015-03-24 NOTE — Telephone Encounter (Signed)
Company and pt advised that pt has no qualifying Dxs to require brace

## 2015-03-30 ENCOUNTER — Other Ambulatory Visit: Payer: Self-pay | Admitting: Internal Medicine

## 2015-03-31 ENCOUNTER — Other Ambulatory Visit: Payer: Self-pay

## 2015-03-31 MED ORDER — ATORVASTATIN CALCIUM 40 MG PO TABS: 40.0000 mg | ORAL_TABLET | Freq: Every day | ORAL | Status: DC

## 2015-04-20 ENCOUNTER — Other Ambulatory Visit: Payer: Self-pay | Admitting: Internal Medicine

## 2015-04-29 ENCOUNTER — Ambulatory Visit: Payer: Medicare Other | Admitting: Gastroenterology

## 2015-05-23 ENCOUNTER — Ambulatory Visit: Payer: Self-pay | Admitting: Podiatry

## 2015-05-25 ENCOUNTER — Observation Stay (HOSPITAL_COMMUNITY): Payer: Medicare Other

## 2015-05-25 ENCOUNTER — Encounter (HOSPITAL_COMMUNITY): Payer: Self-pay | Admitting: General Practice

## 2015-05-25 ENCOUNTER — Observation Stay (HOSPITAL_COMMUNITY)
Admission: AD | Admit: 2015-05-25 | Discharge: 2015-05-27 | Disposition: A | Discharge status: Home Health | Payer: Medicare Other | Source: Ambulatory Visit | Attending: Cardiovascular Disease | Admitting: Cardiovascular Disease

## 2015-05-25 DIAGNOSIS — M549 Dorsalgia, unspecified: Secondary | ICD-10-CM | POA: Diagnosis present

## 2015-05-25 DIAGNOSIS — Z86010 Personal history of colonic polyps: Secondary | ICD-10-CM

## 2015-05-25 DIAGNOSIS — E1122 Type 2 diabetes mellitus with diabetic chronic kidney disease: Secondary | ICD-10-CM | POA: Diagnosis not present

## 2015-05-25 DIAGNOSIS — Z85528 Personal history of other malignant neoplasm of kidney: Secondary | ICD-10-CM | POA: Diagnosis not present

## 2015-05-25 DIAGNOSIS — I10 Essential (primary) hypertension: Secondary | ICD-10-CM | POA: Diagnosis not present

## 2015-05-25 DIAGNOSIS — I120 Hypertensive chronic kidney disease with stage 5 chronic kidney disease or end stage renal disease: Secondary | ICD-10-CM | POA: Insufficient documentation

## 2015-05-25 DIAGNOSIS — I739 Peripheral vascular disease, unspecified: Secondary | ICD-10-CM | POA: Diagnosis not present

## 2015-05-25 DIAGNOSIS — D631 Anemia in chronic kidney disease: Secondary | ICD-10-CM | POA: Diagnosis not present

## 2015-05-25 DIAGNOSIS — Z7982 Long term (current) use of aspirin: Secondary | ICD-10-CM | POA: Insufficient documentation

## 2015-05-25 DIAGNOSIS — Z8673 Personal history of transient ischemic attack (TIA), and cerebral infarction without residual deficits: Secondary | ICD-10-CM | POA: Diagnosis not present

## 2015-05-25 DIAGNOSIS — R262 Difficulty in walking, not elsewhere classified: Secondary | ICD-10-CM | POA: Diagnosis not present

## 2015-05-25 DIAGNOSIS — N186 End stage renal disease: Secondary | ICD-10-CM | POA: Insufficient documentation

## 2015-05-25 DIAGNOSIS — Z89421 Acquired absence of other right toe(s): Secondary | ICD-10-CM | POA: Insufficient documentation

## 2015-05-25 DIAGNOSIS — Z992 Dependence on renal dialysis: Secondary | ICD-10-CM | POA: Diagnosis not present

## 2015-05-25 DIAGNOSIS — F17200 Nicotine dependence, unspecified, uncomplicated: Secondary | ICD-10-CM | POA: Diagnosis present

## 2015-05-25 DIAGNOSIS — E785 Hyperlipidemia, unspecified: Secondary | ICD-10-CM | POA: Diagnosis present

## 2015-05-25 DIAGNOSIS — M109 Gout, unspecified: Secondary | ICD-10-CM | POA: Diagnosis present

## 2015-05-25 DIAGNOSIS — E039 Hypothyroidism, unspecified: Secondary | ICD-10-CM | POA: Diagnosis not present

## 2015-05-25 DIAGNOSIS — Z8249 Family history of ischemic heart disease and other diseases of the circulatory system: Secondary | ICD-10-CM | POA: Insufficient documentation

## 2015-05-25 DIAGNOSIS — R112 Nausea with vomiting, unspecified: Secondary | ICD-10-CM

## 2015-05-25 DIAGNOSIS — F411 Generalized anxiety disorder: Secondary | ICD-10-CM | POA: Diagnosis present

## 2015-05-25 DIAGNOSIS — I651 Occlusion and stenosis of basilar artery: Secondary | ICD-10-CM | POA: Insufficient documentation

## 2015-05-25 DIAGNOSIS — G2581 Restless legs syndrome: Secondary | ICD-10-CM | POA: Diagnosis present

## 2015-05-25 DIAGNOSIS — R55 Syncope and collapse: Principal | ICD-10-CM | POA: Insufficient documentation

## 2015-05-25 DIAGNOSIS — Z87891 Personal history of nicotine dependence: Secondary | ICD-10-CM | POA: Diagnosis not present

## 2015-05-25 DIAGNOSIS — Z1211 Encounter for screening for malignant neoplasm of colon: Secondary | ICD-10-CM

## 2015-05-25 LAB — COMPREHENSIVE METABOLIC PANEL
ALBUMIN: 3.7 g/dL (ref 3.5–5.0)
ALT: 29 U/L (ref 14–54)
ANION GAP: 22 — AB (ref 5–15)
AST: 31 U/L (ref 15–41)
Alkaline Phosphatase: 85 U/L (ref 38–126)
BILIRUBIN TOTAL: 1.6 mg/dL — AB (ref 0.3–1.2)
BUN: 85 mg/dL — ABNORMAL HIGH (ref 6–20)
CO2: 25 mmol/L (ref 22–32)
Calcium: 9 mg/dL (ref 8.9–10.3)
Chloride: 93 mmol/L — ABNORMAL LOW (ref 101–111)
Creatinine, Ser: 12.98 mg/dL — ABNORMAL HIGH (ref 0.44–1.00)
GFR, EST AFRICAN AMERICAN: 3 mL/min — AB (ref >60)
GFR, EST NON AFRICAN AMERICAN: 2 mL/min — AB (ref >60)
GLUCOSE: 73 mg/dL (ref 65–99)
POTASSIUM: 6.6 mmol/L — AB (ref 3.5–5.1)
Sodium: 140 mmol/L (ref 135–145)
TOTAL PROTEIN: 7.9 g/dL (ref 6.5–8.1)

## 2015-05-25 LAB — CBC WITH DIFFERENTIAL/PLATELET
BASOS ABS: 0 10*3/uL (ref 0.0–0.1)
Basophils Relative: 0 %
Eosinophils Absolute: 0.6 10*3/uL (ref 0.0–0.7)
Eosinophils Relative: 6 %
HEMATOCRIT: 42.4 % (ref 36.0–46.0)
HEMOGLOBIN: 14.4 g/dL (ref 12.0–15.0)
LYMPHS PCT: 26 %
Lymphs Abs: 2.6 10*3/uL (ref 0.7–4.0)
MCH: 30.3 pg (ref 26.0–34.0)
MCHC: 34 g/dL (ref 30.0–36.0)
MCV: 89.3 fL (ref 78.0–100.0)
Monocytes Absolute: 0.9 10*3/uL (ref 0.1–1.0)
Monocytes Relative: 9 %
NEUTROS ABS: 5.7 10*3/uL (ref 1.7–7.7)
NEUTROS PCT: 58 %
Platelets: 160 10*3/uL (ref 150–400)
RBC: 4.75 MIL/uL (ref 3.87–5.11)
RDW: 17.7 % — ABNORMAL HIGH (ref 11.5–15.5)
WBC: 9.8 10*3/uL (ref 4.0–10.5)

## 2015-05-25 LAB — MRSA PCR SCREENING: MRSA by PCR: POSITIVE — AB

## 2015-05-25 LAB — GLUCOSE, CAPILLARY: Glucose-Capillary: 124 mg/dL — ABNORMAL HIGH (ref 65–99)

## 2015-05-25 MED ORDER — FAMOTIDINE 20 MG PO TABS
40.0000 mg | ORAL_TABLET | Freq: Two times a day (BID) | ORAL | Status: DC
Start: 2015-05-25 — End: 2015-05-26
  Administered 2015-05-25 – 2015-05-26 (×2): 40 mg via ORAL
  Filled 2015-05-25 (×2): qty 2

## 2015-05-25 MED ORDER — ONDANSETRON HCL 4 MG PO TABS
4.0000 mg | ORAL_TABLET | Freq: Four times a day (QID) | ORAL | Status: DC | PRN
Start: 2015-05-25 — End: 2015-05-27

## 2015-05-25 MED ORDER — PREGABALIN 75 MG PO CAPS
75.0000 mg | ORAL_CAPSULE | Freq: Every day | ORAL | Status: DC
  Filled 2015-05-25: qty 1

## 2015-05-25 MED ORDER — CINACALCET HCL 30 MG PO TABS
30.0000 mg | ORAL_TABLET | Freq: Every day | ORAL | Status: DC
  Filled 2015-05-25: qty 1

## 2015-05-25 MED ORDER — MUPIROCIN 2 % EX OINT
1.0000 "application " | TOPICAL_OINTMENT | Freq: Two times a day (BID) | CUTANEOUS | Status: DC
  Administered 2015-05-25 – 2015-05-27 (×4): 1 via NASAL
  Filled 2015-05-25 (×2): qty 22

## 2015-05-25 MED ORDER — DULOXETINE HCL 60 MG PO CPEP
60.0000 mg | ORAL_CAPSULE | Freq: Every day | ORAL | Status: DC
  Administered 2015-05-25 – 2015-05-27 (×3): 60 mg via ORAL
  Filled 2015-05-25 (×3): qty 1

## 2015-05-25 MED ORDER — CHLORHEXIDINE GLUCONATE CLOTH 2 % EX PADS
6.0000 | MEDICATED_PAD | Freq: Every day | CUTANEOUS | Status: DC
  Administered 2015-05-26 – 2015-05-27 (×2): 6 via TOPICAL

## 2015-05-25 MED ORDER — SEVELAMER CARBONATE 800 MG PO TABS
2400.0000 mg | ORAL_TABLET | Freq: Three times a day (TID) | ORAL | Status: DC
  Administered 2015-05-25 – 2015-05-26 (×2): 2400 mg via ORAL
  Filled 2015-05-25 (×2): qty 3

## 2015-05-25 MED ORDER — SODIUM POLYSTYRENE SULFONATE 15 GM/60ML PO SUSP
30.0000 g | Freq: Once | ORAL | Status: AC
  Administered 2015-05-25: 30 g via ORAL
  Filled 2015-05-25: qty 120

## 2015-05-25 MED ORDER — SODIUM CHLORIDE 0.9 % IJ SOLN
3.0000 mL | Freq: Two times a day (BID) | INTRAMUSCULAR | Status: DC
  Administered 2015-05-25 – 2015-05-26 (×4): 3 mL via INTRAVENOUS

## 2015-05-25 MED ORDER — PREGABALIN 75 MG PO CAPS
75.0000 mg | ORAL_CAPSULE | Freq: Every day | ORAL | Status: DC
  Administered 2015-05-25 – 2015-05-26 (×2): 75 mg via ORAL
  Filled 2015-05-25 (×2): qty 1

## 2015-05-25 MED ORDER — LEVOTHYROXINE SODIUM 175 MCG PO TABS
175.0000 ug | ORAL_TABLET | Freq: Every day | ORAL | Status: DC
  Administered 2015-05-26 – 2015-05-27 (×2): 175 ug via ORAL
  Filled 2015-05-25 (×2): qty 1

## 2015-05-25 MED ORDER — HYDROXYZINE HCL 25 MG PO TABS: 25.0000 mg | ORAL_TABLET | Freq: Three times a day (TID) | ORAL | Status: DC | PRN

## 2015-05-25 MED ORDER — CLONAZEPAM 0.5 MG PO TABS: 2.0000 mg | ORAL_TABLET | Freq: Two times a day (BID) | ORAL | Status: DC

## 2015-05-25 MED ORDER — HYDROCODONE-ACETAMINOPHEN 5-325 MG PO TABS
1.0000 | ORAL_TABLET | Freq: Four times a day (QID) | ORAL | Status: DC | PRN
  Administered 2015-05-25 – 2015-05-27 (×3): 1 via ORAL
  Filled 2015-05-25 (×3): qty 1

## 2015-05-25 MED ORDER — QUETIAPINE FUMARATE 25 MG PO TABS
100.0000 mg | ORAL_TABLET | Freq: Every day | ORAL | Status: DC
  Administered 2015-05-25 – 2015-05-26 (×2): 100 mg via ORAL
  Filled 2015-05-25 (×2): qty 4

## 2015-05-25 MED ORDER — DOCUSATE SODIUM 100 MG PO CAPS
100.0000 mg | ORAL_CAPSULE | Freq: Two times a day (BID) | ORAL | Status: DC
  Administered 2015-05-25 – 2015-05-27 (×4): 100 mg via ORAL
  Filled 2015-05-25 (×5): qty 1

## 2015-05-25 MED ORDER — PANTOPRAZOLE SODIUM 40 MG PO TBEC
40.0000 mg | DELAYED_RELEASE_TABLET | Freq: Every day | ORAL | Status: DC
  Administered 2015-05-26 – 2015-05-27 (×2): 40 mg via ORAL
  Filled 2015-05-25 (×2): qty 1

## 2015-05-25 MED ORDER — ACETAMINOPHEN 325 MG PO TABS
650.0000 mg | ORAL_TABLET | Freq: Four times a day (QID) | ORAL | Status: DC | PRN
  Administered 2015-05-25: 650 mg via ORAL
  Filled 2015-05-25: qty 2

## 2015-05-25 MED ORDER — HEPARIN SODIUM (PORCINE) 5000 UNIT/ML IJ SOLN
5000.0000 [IU] | Freq: Three times a day (TID) | INTRAMUSCULAR | Status: DC
  Administered 2015-05-25 – 2015-05-27 (×5): 5000 [IU] via SUBCUTANEOUS
  Filled 2015-05-25 (×5): qty 1

## 2015-05-25 MED ORDER — CINACALCET HCL 30 MG PO TABS
30.0000 mg | ORAL_TABLET | Freq: Every day | ORAL | Status: DC
  Administered 2015-05-25: 30 mg via ORAL
  Filled 2015-05-25: qty 1

## 2015-05-25 MED ORDER — ALBUTEROL SULFATE (2.5 MG/3ML) 0.083% IN NEBU
2.5000 mg | INHALATION_SOLUTION | Freq: Four times a day (QID) | RESPIRATORY_TRACT | Status: DC | PRN
Start: 2015-05-25 — End: 2015-05-27

## 2015-05-25 MED ORDER — ALLOPURINOL 100 MG PO TABS
100.0000 mg | ORAL_TABLET | Freq: Every day | ORAL | Status: DC
  Administered 2015-05-25 – 2015-05-27 (×3): 100 mg via ORAL
  Filled 2015-05-25 (×3): qty 1

## 2015-05-25 MED ORDER — ADULT MULTIVITAMIN W/MINERALS CH
1.0000 | ORAL_TABLET | Freq: Every day | ORAL | Status: DC
Start: 2015-05-25 — End: 2015-05-26
  Administered 2015-05-25 – 2015-05-26 (×2): 1 via ORAL
  Filled 2015-05-25 (×2): qty 1

## 2015-05-25 MED ORDER — BENZONATATE 100 MG PO CAPS: 100.0000 mg | ORAL_CAPSULE | Freq: Four times a day (QID) | ORAL | Status: DC | PRN

## 2015-05-25 MED ORDER — ATORVASTATIN CALCIUM 40 MG PO TABS
40.0000 mg | ORAL_TABLET | Freq: Every day | ORAL | Status: DC
  Administered 2015-05-25 – 2015-05-27 (×3): 40 mg via ORAL
  Filled 2015-05-25 (×4): qty 1

## 2015-05-25 MED ORDER — INFLUENZA VAC SPLIT QUAD 0.5 ML IM SUSY
0.5000 mL | PREFILLED_SYRINGE | INTRAMUSCULAR | Status: DC
  Filled 2015-05-25 (×2): qty 0.5

## 2015-05-25 MED ORDER — DOXERCALCIFEROL 4 MCG/2ML IV SOLN
4.0000 ug | INTRAVENOUS | Status: DC
  Administered 2015-05-26: 4 ug via INTRAVENOUS
  Filled 2015-05-25: qty 2

## 2015-05-25 MED ORDER — COLCHICINE 0.6 MG PO TABS
0.6000 mg | ORAL_TABLET | Freq: Every day | ORAL | Status: DC
  Administered 2015-05-25 – 2015-05-27 (×3): 0.6 mg via ORAL
  Filled 2015-05-25 (×3): qty 1

## 2015-05-25 MED ORDER — MONTELUKAST SODIUM 10 MG PO TABS
10.0000 mg | ORAL_TABLET | Freq: Every day | ORAL | Status: DC
  Administered 2015-05-25 – 2015-05-26 (×2): 10 mg via ORAL
  Filled 2015-05-25 (×2): qty 1

## 2015-05-25 NOTE — H&P (Signed)
Referring Physician:  Rada Hay Frieze is an 74 y.o. female.                       Chief Complaint: Syncope  HPI: 74 year old female presents with syncopal episode yesterday without chest pain or palpitaion. She has history of hypertension, DM, II, Dyslipidemia, Gout, anemia, tobacco use disorder, Anxiety and depression, ESRD on hemodialysis, PVD with left and right 2nd toe amputations, back pain and hip pain.  Past Medical History  Diagnosis Date  . HYPOTHYROIDISM 02/17/2007    s/p surgical removal of goiter in 1997  . DIABETES MELLITUS, TYPE II 02/17/2007  . HYPERLIPIDEMIA 02/17/2007  . GOUT 05/29/2007  . ANEMIA-NOS 05/29/2007  . ANXIETY 03/23/2010  . 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 DEPRESSION 02/17/2007  . RESTLESS LEG SYNDROME 05/29/2007  . COMMON MIGRAINE 05/29/2007  . PERIPHERAL NEUROPATHY 05/29/2007  . HYPERTENSION 02/17/2007  . PERIPHERAL VASCULAR DISEASE 02/17/2007  . PEPTIC ULCER DISEASE 05/29/2007  . ESRD on hemodialysis 02/17/2007    Started dialysis April 2014.  Gets HD at [facility] on TTS schedule.  Cause of ESRD was HTN.     . OSTEOPENIA 09/22/2009  . SEIZURE DISORDER 02/17/2007  . Memory loss 01/24/2010  . Personal history of colonic polyps 11/16/2009  . Palpitations 09/08/2010  . Other diseases of vocal cords 1996  . HEART MURMUR, HX OF 10/02/2010  . 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 Chronic neck pain 12/13/2010  . GERD (gastroesophageal reflux disease)   . 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 Cancer of kidney 10/07/2012    Followed per Dr Despina Pole, MD, urology, [place]   . Arthritis   . Critical lower limb ischemia       Past Surgical History  Procedure Laterality Date  . Left toe amputated  2006  . Bunionectomy  1980  . Goiter removal  1997  . Stress cardiolite  06/18/2006  . Tranthoracic echocardiogram  06/18/2006  . Electrocardiogram  05/29/2006  . Cholecystectomy    . Rotator cuff repair  Left    Dr. Sharol Given  .  Av fistula placement  03/13/2012    Procedure: ARTERIOVENOUS (AV) FISTULA CREATION;  Surgeon: Conrad [place], MD;  Location: [place];  Service: Vascular;  Laterality: Right;  . Eye surgery      bilateral cataract removal  . Esophagoscopy w/ botox injection  07/22/2012    Procedure: ESOPHAGOSCOPY WITH BOTOX INJECTION;  Surgeon: Rozetta Nunnery, MD;  Location: [place];  Service: ENT;  Laterality: N/A;  esophageal dilation  . Insertion of dialysis catheter N/A 02/05/2013    Procedure: INSERTION OF DIALYSIS CATHETER;  Surgeon: Angelia Mould, MD;  Location: [place];  Service: Vascular;  Laterality: N/A;  Ultrasound guided  . Toe amputation Right Aug. 2015    2nd     Family History  Problem Relation Age of Onset  . Dementia Mother   . Hypertension Mother   . Coronary artery disease Other   . Hyperlipidemia Other   . Hypertension Other   . Ovarian cancer Other   . Stroke Other   . Hypertension Sister   . Hypertension Brother   . Heart attack Brother   . Stroke Brother    Social History:  reports that she has quit smoking. Her smoking use included Cigarettes. She has a 11.75 pack-year smoking history. She has never used smokeless tobacco. She reports that  she does not drink alcohol or use illicit drugs.  Allergies:  Allergies  Allergen Reactions  . Nsaids     REACTION: renal dysfunction  . Vancomycin     See comment under cephalosporin    Medications Prior to Admission  Medication Sig Dispense Refill  . acetaminophen (TYLENOL) 325 MG tablet Take 2 tablets (650 mg total) by mouth every 6 (six) hours as needed for mild pain (or Fever >/= 101).    Marland Kitchen albuterol (PROVENTIL HFA;VENTOLIN HFA) 108 (90 BASE) MCG/ACT inhaler Inhale 2 puffs into the lungs every 6 (six) hours as needed for wheezing or shortness of breath. 1 Inhaler 11  . allopurinol (ZYLOPRIM) 100 MG tablet Take 100 mg by mouth daily.     Marland Kitchen aspirin EC 81 MG tablet Take 81 mg by mouth daily.    Marland Kitchen  atorvastatin (LIPITOR) 40 MG tablet TAKE ONE TABLET BY MOUTH ONCE DAILY 30 tablet 5  . benzonatate (TESSALON) 100 MG capsule Take 100-200 mg by mouth every 6 (six) hours as needed for cough.    . cinacalcet (SENSIPAR) 30 MG tablet Take 1 tablet (30 mg total) by mouth daily. 60 tablet 11  . clonazePAM (KLONOPIN) 2 MG tablet Take 1 tablet (2 mg total) by mouth 2 (two) times daily. As needed 60 tablet 5  . colchicine 0.6 MG tablet Take 0.6 mg by mouth daily.     . darbepoetin (ARANESP) 25 MCG/0.42ML SOLN injection Inject 25 mcg into the vein every 7 (seven) days. Every Thursday at dialysis    . docusate sodium (COLACE) 100 MG capsule Take 100 mg by mouth 2 (two) times daily.     Marland Kitchen doxercalciferol (HECTOROL) 4 MCG/2ML injection Inject 4 mcg into the vein Every Tuesday,Thursday,and Saturday with dialysis.    . DULERA 100-5 MCG/ACT AERO INHALE TWO PUFFS INTO LUNGS TWICE DAILY 1 Inhaler 5  . DULoxetine (CYMBALTA) 30 MG capsule Take 2 capsules (60 mg total) by mouth daily. 60 capsule 11  . famotidine (PEPCID) 40 MG tablet Take 1 tablet (40 mg total) by mouth 2 (two) times daily. 60 tablet 11  . HYDROcodone-acetaminophen (NORCO/VICODIN) 5-325 MG per tablet Take 1 tablet by mouth every 6 (six) hours as needed for severe pain. 30 tablet 0  . hydrOXYzine (ATARAX/VISTARIL) 10 MG tablet Take 1 tablet (10 mg total) by mouth 3 (three) times daily as needed for anxiety. (Patient taking differently: Take 25 mg by mouth 3 (three) times daily as needed for anxiety. ) 30 tablet 0  . lactulose (CHRONULAC) 10 GM/15ML solution Take 30 mLs (20 g total) by mouth 3 (three) times daily. 473 mL 3  . levothyroxine (SYNTHROID, LEVOTHROID) 175 MCG tablet Take 1 tablet (175 mcg total) by mouth daily before breakfast. 90 tablet 3  . montelukast (SINGULAIR) 10 MG tablet Take 10 mg by mouth at bedtime.    . montelukast (SINGULAIR) 10 MG tablet TAKE ONE TABLET BY MOUTH ONCE DAILY AT BEDTIME 30 tablet 5  . multivitamin (RENA-VIT) TABS  tablet Take 1 tablet by mouth daily.    Marland Kitchen omeprazole (PRILOSEC) 40 MG capsule Take 1 capsule twice daily. 60 capsule 11  . ondansetron (ZOFRAN) 4 MG tablet Take 4 mg by mouth every 6 (six) hours as needed for nausea or vomiting.    . pregabalin (LYRICA) 25 MG capsule Take 1 capsule (25 mg total) by mouth daily. (Patient taking differently: Take 75 mg by mouth daily. ) 30 capsule 2  . QUEtiapine (SEROQUEL) 100 MG tablet Take 0.5  tablets (50 mg total) by mouth at bedtime. (Patient taking differently: Take 100 mg by mouth at bedtime. ) 30 tablet 0  . QUEtiapine (SEROQUEL) 100 MG tablet TAKE ONE TABLET BY MOUTH AT BEDTIME 90 tablet 1  . sevelamer carbonate (RENVELA) 800 MG tablet Take 2,400 mg by mouth 3 (three) times daily with meals.    . [DISCONTINUED] allopurinol (ZYLOPRIM) 100 MG tablet TAKE ONE TABLET BY MOUTH ONCE DAILY 90 tablet 1  . [DISCONTINUED] atorvastatin (LIPITOR) 40 MG tablet Take 1 tablet (40 mg total) by mouth daily. 90 tablet 1  . [DISCONTINUED] cyclobenzaprine (FLEXERIL) 10 MG tablet TAKE ONE TABLET BY MOUTH THREE TIMES DAILY AS NEEDED FOR MUSCLE SPASMS 30 tablet 0  . [DISCONTINUED] famotidine (PEPCID) 40 MG tablet Take 1 tablet (40 mg total) by mouth 2 (two) times daily. (Patient not taking: Reported on 01/10/2015) 60 tablet 6    No results found for this or any previous visit (from the past 48 hour(s)). No results found.  Review Of Systems The patient denies fever, vision loss, decreased hearing, hoarseness, chest pain, peripheral edema, hemoptysis, abdominal pain, melena, hematochezia, severe indigestion/heartburn, hematuria, incontinence, genital sores, suspicious skin lesions, transient blindness, unusual weight change, abnormal bleeding, enlarged lymph nodes, angioedema, and breast masses. She has anorexia, weight loss, syncope, depression, arhtritis, ESRD and DM, II  Blood pressure 112/62, pulse 82, temperature 98.3 F (36.8 C), temperature source Oral, resp. rate 18, height  '5\' 7"'$  (1.702 m), weight 67.359 kg (148 lb 8 oz), SpO2 100 %.  HEENT: Normocephalic. Small bruise left upper eye-lid. Mucous membranes pale pink; PERRLA; EOM intact;  No scleral icterus, Nares: Patent, Oropharynx: Clear, Edentulous or Fair Dentition, Neck: FROM, no cervical lymphadenopathy nor thyromegaly or carotid bruit; no JVD; CHEST WALL: No tenderness CHEST: Normal respiration, clear to auscultation bilaterally. HEART: Regular rate and rhythm; no rubs or gallops. II/VI systolic murmur. BACK: No kyphosis or scoliosis; no CVA tenderness ABDOMEN: Positive Bowel Sounds, Scaphoid, Obese, soft non-tender; no masses, no organomegaly, no pannus; no intertriginous candida. EXTREMITIES: No bone or joint deformity; age-appropriate arthropathy of the hands and knees; no cyanosis, clubbing or edema; no ulcerations. Right hypothenar area bruise. Left elbow small bruise.  PULSES: 2+ and symmetric SKIN: Normal hydration no rash or ulceration CNS: Cranial nerves 2-12 grossly intact no focal neurologic deficit  Assessment/Plan Syncope Hypertension DM, II Dyslipidemia Gout Anemia of chronic disease Tobacco use disorder Anxiety and depression ESRD on hemodialysis PVD with left and right 2nd toe amputations Back pain and hip pain.  Place in observation Telemetry Carotid doppler Echocardiogram MRI/MRA brain-Neurology consult if needed. Nephrology consult for dialysis   Birdie Riddle, MD  05/25/2015, 2:23 PM

## 2015-05-25 NOTE — Progress Notes (Signed)
   05/25/15 2217  Vitals  BP (!) 85/53 mmHg  MAP (mmHg) 60  BP Location Left Arm  BP Method Automatic  Patient Position (if appropriate) Lying  Pulse Rate 84  Pt's BP low, pt alert and oriented, Dr. Candyce Churn notified and ordered not to give prn norco and hold klonopin. Will continue to monitor.

## 2015-05-25 NOTE — Progress Notes (Signed)
Patient has arrived to unit and to room 3E01. Patient alert and oriented x4 and appears to be in no distress at this time. Patient appears to have great hygiene. Patient able to state reasons for being admitted to hospital. Patient has no complaints at this time. Patient placed on tele. Will call CCMD to make aware.

## 2015-05-25 NOTE — Progress Notes (Signed)
CRITICAL VALUE ALERT  Critical value received:  Potassium 6.6  Date of notification:  05/25/2015  Time of notification:  0947  Critical value read back:Yes.    Nurse who received alert:  Sherlon Handing, RN  MD notified (1st page):  Dr. Doylene Canard  Time of first page:  1610  MD notified (2nd page):  Time of second page:  Responding MD:  Dr. Doylene Canard  Time MD responded:  1610  New orders placed in regards to critical value

## 2015-05-25 NOTE — Progress Notes (Signed)
Dr. Doylene Canard made aware that patient was in room 3E01

## 2015-05-26 ENCOUNTER — Observation Stay (HOSPITAL_BASED_OUTPATIENT_CLINIC_OR_DEPARTMENT_OTHER): Payer: Medicare Other

## 2015-05-26 ENCOUNTER — Observation Stay (HOSPITAL_COMMUNITY): Payer: Medicare Other

## 2015-05-26 DIAGNOSIS — R55 Syncope and collapse: Secondary | ICD-10-CM

## 2015-05-26 LAB — CBC
HEMATOCRIT: 34.5 % — AB (ref 36.0–46.0)
Hemoglobin: 11.8 g/dL — ABNORMAL LOW (ref 12.0–15.0)
MCH: 30 pg (ref 26.0–34.0)
MCHC: 34.2 g/dL (ref 30.0–36.0)
MCV: 87.8 fL (ref 78.0–100.0)
Platelets: 153 10*3/uL (ref 150–400)
RBC: 3.93 MIL/uL (ref 3.87–5.11)
RDW: 17.3 % — ABNORMAL HIGH (ref 11.5–15.5)
WBC: 6.9 10*3/uL (ref 4.0–10.5)

## 2015-05-26 LAB — BASIC METABOLIC PANEL
ANION GAP: 16 — AB (ref 5–15)
BUN: 93 mg/dL — ABNORMAL HIGH (ref 6–20)
CALCIUM: 7.6 mg/dL — AB (ref 8.9–10.3)
CHLORIDE: 92 mmol/L — AB (ref 101–111)
CO2: 24 mmol/L (ref 22–32)
Creatinine, Ser: 12.61 mg/dL — ABNORMAL HIGH (ref 0.44–1.00)
GFR calc non Af Amer: 3 mL/min — ABNORMAL LOW (ref >60)
GFR, EST AFRICAN AMERICAN: 3 mL/min — AB (ref >60)
GLUCOSE: 103 mg/dL — AB (ref 65–99)
POTASSIUM: 4.7 mmol/L (ref 3.5–5.1)
Sodium: 132 mmol/L — ABNORMAL LOW (ref 135–145)

## 2015-05-26 LAB — IRON AND TIBC
Iron: 119 ug/dL (ref 28–170)
SATURATION RATIOS: 54 % — AB (ref 10.4–31.8)
TIBC: 220 ug/dL — ABNORMAL LOW (ref 250–450)
UIBC: 101 ug/dL

## 2015-05-26 LAB — TSH: TSH: 1.166 u[IU]/mL (ref 0.350–4.500)

## 2015-05-26 LAB — FERRITIN: FERRITIN: 950 ng/mL — AB (ref 11–307)

## 2015-05-26 MED ORDER — DOXERCALCIFEROL 4 MCG/2ML IV SOLN
INTRAVENOUS | Status: AC
  Filled 2015-05-26: qty 2

## 2015-05-26 MED ORDER — CLONAZEPAM 0.5 MG PO TABS
1.0000 mg | ORAL_TABLET | Freq: Two times a day (BID) | ORAL | Status: DC
  Administered 2015-05-26 – 2015-05-27 (×3): 1 mg via ORAL
  Filled 2015-05-26 (×3): qty 2

## 2015-05-26 MED ORDER — RENA-VITE PO TABS
1.0000 | ORAL_TABLET | Freq: Every day | ORAL | Status: DC
  Administered 2015-05-26: 1 via ORAL
  Filled 2015-05-26: qty 1

## 2015-05-26 MED ORDER — HYDROCORTISONE 1 % EX CREA
TOPICAL_CREAM | Freq: Two times a day (BID) | CUTANEOUS | Status: DC
  Administered 2015-05-26 – 2015-05-27 (×2): via TOPICAL
  Filled 2015-05-26 (×2): qty 28

## 2015-05-26 MED ORDER — SEVELAMER CARBONATE 800 MG PO TABS
1600.0000 mg | ORAL_TABLET | Freq: Three times a day (TID) | ORAL | Status: DC
  Administered 2015-05-26 – 2015-05-27 (×2): 1600 mg via ORAL
  Filled 2015-05-26 (×2): qty 2

## 2015-05-26 MED ORDER — FAMOTIDINE 20 MG PO TABS
20.0000 mg | ORAL_TABLET | Freq: Every day | ORAL | Status: DC
  Administered 2015-05-27: 20 mg via ORAL
  Filled 2015-05-26: qty 1

## 2015-05-26 NOTE — Progress Notes (Signed)
  Echocardiogram 2D Echocardiogram has been performed.  [name] [name] 05/26/2015, 10:31 AM

## 2015-05-26 NOTE — Consult Note (Signed)
[facility] Renal Consultation Note  Indication for Consultation:  Management of ESRD/hemodialysis; anemia, hypertension/volume and secondary hyperparathyroidism  HPI: [name] [name] is a 74 y.o. female ESRD on HD TTS last hd was SAt oct 15, "i missed because of this dizziness, admitted for syncopal wu by Dr. Loetta Rough. She denies fevers, chills, chest pain , sob, gi or urololgy symptoms. Some reported am dry cough ,"smoking cigarettes sometimes." She lives alone with ho anxiety, depression, DM type 2, PVD L and R toe amputations, chronic hip and back pain on Lyrica,clonazepam , Seroquel ,and hydrocodone .At recent hd lowish bp pre and post and leaving above edw with no bp meds listed. Now in room asking when going home.     Past Medical History  Diagnosis Date  . HYPOTHYROIDISM 02/17/2007    s/p surgical removal of goiter in 1997  . DIABETES MELLITUS, TYPE II 02/17/2007  . HYPERLIPIDEMIA 02/17/2007  . GOUT 05/29/2007  . ANEMIA-NOS 05/29/2007  . ANXIETY 03/23/2010  . 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 DEPRESSION 02/17/2007  . RESTLESS LEG SYNDROME 05/29/2007  . COMMON MIGRAINE 05/29/2007  . PERIPHERAL NEUROPATHY 05/29/2007  . HYPERTENSION 02/17/2007  . PERIPHERAL VASCULAR DISEASE 02/17/2007  . PEPTIC ULCER DISEASE 05/29/2007  . ESRD on hemodialysis ([place]) 02/17/2007    Started dialysis April 2014.  Gets HD at [facility] on TTS schedule.  Cause of ESRD was HTN.     . OSTEOPENIA 09/22/2009  . SEIZURE DISORDER 02/17/2007  . Memory loss 01/24/2010  . Personal history of colonic polyps 11/16/2009  . Palpitations 09/08/2010  . Other diseases of vocal cords 1996  . HEART MURMUR, HX OF 10/02/2010  . 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 Chronic neck pain 12/13/2010  . GERD (gastroesophageal reflux disease)   . 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 Cancer of kidney ([place]) 10/07/2012    Followed per Dr Despina Pole, MD, urology, [place]   . Arthritis   .  Critical lower limb ischemia     Past Surgical History  Procedure Laterality Date  . Left toe amputated  2006  . Bunionectomy  1980  . Goiter removal  1997  . Stress cardiolite  06/18/2006  . Tranthoracic echocardiogram  06/18/2006  . Electrocardiogram  05/29/2006  . Cholecystectomy    . Rotator cuff repair  Left    Dr. Sharol Given  . Av fistula placement  03/13/2012    Procedure: ARTERIOVENOUS (AV) FISTULA CREATION;  Surgeon: Conrad Dewey, MD;  Location: [place];  Service: Vascular;  Laterality: Right;  . Eye surgery      bilateral cataract removal  . Esophagoscopy w/ botox injection  07/22/2012    Procedure: ESOPHAGOSCOPY WITH BOTOX INJECTION;  Surgeon: Rozetta Nunnery, MD;  Location: [place];  Service: ENT;  Laterality: N/A;  esophageal dilation  . Insertion of dialysis catheter N/A 02/05/2013    Procedure: INSERTION OF DIALYSIS CATHETER;  Surgeon: Angelia Mould, MD;  Location: [place];  Service: Vascular;  Laterality: N/A;  Ultrasound guided  . Toe amputation Right Aug. 2015    2nd       Family History  Problem Relation Age of Onset  . Dementia Mother   . Hypertension Mother   . Coronary artery disease Other   . Hyperlipidemia Other   . Hypertension Other   . Ovarian cancer Other   . Stroke Other   . Hypertension Sister   . Hypertension Brother   .  Heart attack Brother   . Stroke Brother       reports that she has been smoking Cigarettes.  She has a 11.75 pack-year smoking history. She has never used smokeless tobacco. She reports that she does not drink alcohol or use illicit drugs.   Allergies  Allergen Reactions  . Nsaids     REACTION: renal dysfunction  . Vancomycin     See comment under cephalosporin    Prior to Admission medications   Medication Sig Start Date End Date Taking? Authorizing Provider  acetaminophen (TYLENOL) 325 MG tablet Take 2 tablets (650 mg total) by mouth every 6 (six) hours as needed for mild pain (or Fever >/= 101).  04/05/14  Yes Delfina Redwood, MD  albuterol (PROVENTIL HFA;VENTOLIN HFA) 108 (90 BASE) MCG/ACT inhaler Inhale 2 puffs into the lungs every 6 (six) hours as needed for wheezing or shortness of breath. 03/01/15  Yes Biagio Borg, MD  allopurinol (ZYLOPRIM) 100 MG tablet Take 100 mg by mouth daily.  01/04/12  Yes Historical Provider, MD  atorvastatin (LIPITOR) 40 MG tablet TAKE ONE TABLET BY MOUTH ONCE DAILY 04/01/15  Yes Biagio Borg, MD  benzonatate (TESSALON) 100 MG capsule Take 100-200 mg by mouth every 6 (six) hours as needed for cough.   Yes Historical Provider, MD  cinacalcet (SENSIPAR) 30 MG tablet Take 1 tablet (30 mg total) by mouth daily. 04/12/14  Yes Gerlene Fee, NP  clonazePAM (KLONOPIN) 2 MG tablet Take 1 tablet (2 mg total) by mouth 2 (two) times daily. As needed 03/01/15  Yes Biagio Borg, MD  colchicine 0.6 MG tablet Take 0.6 mg by mouth daily.    Yes Historical Provider, MD  darbepoetin (ARANESP) 25 MCG/0.42ML SOLN injection Inject 25 mcg into the vein every 7 (seven) days. Every Thursday at dialysis   Yes Historical Provider, MD  docusate sodium (COLACE) 100 MG capsule Take 100 mg by mouth 2 (two) times daily.    Yes Historical Provider, MD  doxercalciferol (HECTOROL) 4 MCG/2ML injection Inject 4 mcg into the vein Every Tuesday,Thursday,and Saturday with dialysis.   Yes Historical Provider, MD  DULERA 100-5 MCG/ACT AERO INHALE TWO PUFFS INTO LUNGS TWICE DAILY 04/20/15  Yes Biagio Borg, MD  DULoxetine (CYMBALTA) 30 MG capsule Take 2 capsules (60 mg total) by mouth daily. 06/15/14  Yes Biagio Borg, MD  famotidine (PEPCID) 40 MG tablet Take 1 tablet (40 mg total) by mouth 2 (two) times daily. Patient taking differently: Take 40 mg by mouth daily.  12/20/14  Yes Amy S Esterwood, PA-C  HYDROcodone-acetaminophen (NORCO/VICODIN) 5-325 MG per tablet Take 1 tablet by mouth every 6 (six) hours as needed for severe pain. 08/05/14  Yes Oswald Hillock, MD  hydrOXYzine (ATARAX/VISTARIL) 10 MG  tablet Take 1 tablet (10 mg total) by mouth 3 (three) times daily as needed for anxiety. Patient taking differently: Take 25 mg by mouth 3 (three) times daily as needed for anxiety.  08/05/14  Yes Oswald Hillock, MD  lactulose (CHRONULAC) 10 GM/15ML solution Take 30 mLs (20 g total) by mouth 3 (three) times daily. Patient taking differently: Take 20 g by mouth every other day.  02/15/15  Yes Biagio Borg, MD  levothyroxine (SYNTHROID, LEVOTHROID) 175 MCG tablet Take 1 tablet (175 mcg total) by mouth daily before breakfast. 03/02/15  Yes Biagio Borg, MD  montelukast (SINGULAIR) 10 MG tablet TAKE ONE TABLET BY MOUTH ONCE DAILY AT BEDTIME 02/04/15  Yes Biagio Borg,  MD  multivitamin (RENA-VIT) TABS tablet Take 1 tablet by mouth daily.   Yes Historical Provider, MD  ondansetron (ZOFRAN) 4 MG tablet Take 4 mg by mouth every 6 (six) hours as needed for nausea or vomiting.   Yes Historical Provider, MD  pregabalin (LYRICA) 50 MG capsule Take 50 mg by mouth daily.   Yes Historical Provider, MD  QUEtiapine (SEROQUEL) 100 MG tablet TAKE ONE TABLET BY MOUTH AT BEDTIME 03/21/15  Yes Biagio Borg, MD  sevelamer carbonate (RENVELA) 800 MG tablet Take 3,200 mg by mouth 3 (three) times daily with meals.    Yes Historical Provider, MD    Continuous:   Results for orders placed or performed during the hospital encounter of 05/25/15 (from the past 48 hour(s))  MRSA PCR Screening     Status: Abnormal   Collection Time: 05/25/15  2:12 PM  Result Value Ref Range   MRSA by PCR POSITIVE (A) NEGATIVE    Comment:        The GeneXpert MRSA Assay (FDA approved for NASAL specimens only), is one component of a comprehensive MRSA colonization surveillance program. It is not intended to diagnose MRSA infection nor to guide or monitor treatment for MRSA infections. RESULT CALLED TO, READ BACK BY AND VERIFIED WITH: Roxana Hires RN 16:45 05/25/15 ([email or web address])   Comprehensive metabolic panel     Status: Abnormal   Collection  Time: 05/25/15  3:15 PM  Result Value Ref Range   Sodium 140 135 - 145 mmol/L   Potassium 6.6 (HH) 3.5 - 5.1 mmol/L    Comment: SLIGHT HEMOLYSIS CRITICAL RESULT CALLED TO, READ BACK BY AND VERIFIED WITH: B QUAKENBUSH,RN 1609 05/25/15 D BRADLEY    Chloride 93 (L) 101 - 111 mmol/L   CO2 25 22 - 32 mmol/L   Glucose, Bld 73 65 - 99 mg/dL   BUN 85 (H) 6 - 20 mg/dL   Creatinine, Ser 12.98 (H) 0.44 - 1.00 mg/dL   Calcium 9.0 8.9 - 10.3 mg/dL   Total Protein 7.9 6.5 - 8.1 g/dL   Albumin 3.7 3.5 - 5.0 g/dL   AST 31 15 - 41 U/L   ALT 29 14 - 54 U/L   Alkaline Phosphatase 85 38 - 126 U/L   Total Bilirubin 1.6 (H) 0.3 - 1.2 mg/dL   GFR calc non Af Amer 2 (L) >60 mL/min   GFR calc Af Amer 3 (L) >60 mL/min    Comment: (NOTE) The eGFR has been calculated using the CKD EPI equation. This calculation has not been validated in all clinical situations. eGFR's persistently <60 mL/min signify possible Chronic Kidney Disease.    Anion gap 22 (H) 5 - 15  CBC WITH DIFFERENTIAL     Status: Abnormal   Collection Time: 05/25/15  3:15 PM  Result Value Ref Range   WBC 9.8 4.0 - 10.5 K/uL   RBC 4.75 3.87 - 5.11 MIL/uL   Hemoglobin 14.4 12.0 - 15.0 g/dL   HCT 42.4 36.0 - 46.0 %   MCV 89.3 78.0 - 100.0 fL   MCH 30.3 26.0 - 34.0 pg   MCHC 34.0 30.0 - 36.0 g/dL   RDW 17.7 (H) 11.5 - 15.5 %   Platelets 160 150 - 400 K/uL   Neutrophils Relative % 58 %   Neutro Abs 5.7 1.7 - 7.7 K/uL   Lymphocytes Relative 26 %   Lymphs Abs 2.6 0.7 - 4.0 K/uL   Monocytes Relative 9 %   Monocytes Absolute 0.9 0.1 -  1.0 K/uL   Eosinophils Relative 6 %   Eosinophils Absolute 0.6 0.0 - 0.7 K/uL   Basophils Relative 0 %   Basophils Absolute 0.0 0.0 - 0.1 K/uL  Glucose, capillary     Status: Abnormal   Collection Time: 05/25/15  9:12 PM  Result Value Ref Range   Glucose-Capillary 124 (H) 65 - 99 mg/dL  Basic metabolic panel     Status: Abnormal   Collection Time: 05/26/15  2:31 AM  Result Value Ref Range   Sodium 132  (L) 135 - 145 mmol/L    Comment: DELTA CHECK NOTED   Potassium 4.7 3.5 - 5.1 mmol/L    Comment: DELTA CHECK NOTED   Chloride 92 (L) 101 - 111 mmol/L   CO2 24 22 - 32 mmol/L   Glucose, Bld 103 (H) 65 - 99 mg/dL   BUN 93 (H) 6 - 20 mg/dL   Creatinine, Ser 12.61 (H) 0.44 - 1.00 mg/dL   Calcium 7.6 (L) 8.9 - 10.3 mg/dL   GFR calc non Af Amer 3 (L) >60 mL/min   GFR calc Af Amer 3 (L) >60 mL/min    Comment: (NOTE) The eGFR has been calculated using the CKD EPI equation. This calculation has not been validated in all clinical situations. eGFR's persistently <60 mL/min signify possible Chronic Kidney Disease.    Anion gap 16 (H) 5 - 15  CBC     Status: Abnormal   Collection Time: 05/26/15  2:31 AM  Result Value Ref Range   WBC 6.9 4.0 - 10.5 K/uL   RBC 3.93 3.87 - 5.11 MIL/uL   Hemoglobin 11.8 (L) 12.0 - 15.0 g/dL   HCT 34.5 (L) 36.0 - 46.0 %   MCV 87.8 78.0 - 100.0 fL   MCH 30.0 26.0 - 34.0 pg   MCHC 34.2 30.0 - 36.0 g/dL   RDW 17.3 (H) 11.5 - 15.5 %   Platelets 153 150 - 400 K/uL    ROS: positives only as listed in hpi  Physical Exam: Filed Vitals:   05/26/15 0704  BP: 87/48  Pulse: 79  Temp: 98.2 F (36.8 C)  Resp: 18     General:alert elderly  BF , OX3 , pleasant , normal MS HEENT: L upper eye lid abrasion( "from my fall"), MMM.nonicteric Neck: suple no jvd Heart: RRR 2/6 sem lsb, no rub or gallop Lungs: exp. Wheezing, non labored breathing Abdomen: soft , nontender, nondistended  Extremities: no pedal edema, [place] R  Toe amputation site healed Skin: warm dry , no overt rash Neuro: moves all extrem,  No acute focal deficits  Noted  Dialysis Access: pos bruit  R UA  AVF  Dialysis Orders: Center: [facility]  on TTS . EDW 66kg  HD Bath 2k ,2.25 ca  Time 3hr 83mn Heparin 2200. Access RUA AVF     Hectorol 1 mcg IV/HD    O esa  Other op labs HGB 12.8/  ca 8.7 phos 4.7   Assessment/Plan 1. Syncope -wu by admit team- Neurology to see/ ? [facility] in 74yo lives alone  with  lowish bp  2. ESRD -  HD TTS on Schedule no uf today, may benefit from Midodrine for bp support on hd  3. Hypotension  -as above /no uf on hd ?Midodrine  start / 3 kg today over edw by BED WT no vol on exam Weights are not accurate, no vol on exam and BP's very low.  No BP meds and not on midodrine.  BP's  at HD have been 13's recently, perhaps she is gaining wt and we need to give her some fluid and raise her dry wt. Will give 1 liter bolus w HD today. Repeat wt's after HD. ECHO normal EF and normal looking RV.  4. Anemia  - hgb 11.8 no esa or fe 5. Metabolic bone disease -  Ca 7.8 corrected , dc sensipar , on renvela. Use 2.5 ca bath , Hec 29mg on hd 6. HO Anxiety/ depression- on meds 7. DM type 2 8. pvd  9. Chronic hip/ back pain  DErnest Haber PA-C CM[place] [id number]/20/2016, 11:39 AM   Pt seen, examined and agree w A/P as above.  RKelly SplinterMD CNewell Rubbermaidpager [phone]   cell [phone]/20/2016, 2:22 PM

## 2015-05-26 NOTE — Progress Notes (Signed)
VASCULAR LAB PRELIMINARY  PRELIMINARY  PRELIMINARY  PRELIMINARY  Carotid duplex completed.    Preliminary report:  40-59% Right ICA stenosis.  601-79% left ICA stenosis.  Vertebral artery flow is antegrade.   Agustin Swatek, RVT 05/26/2015, 11:48 AM

## 2015-05-26 NOTE — Progress Notes (Signed)
Ref: Cathlean Cower, MD   Subjective:  Feeling better post 1 litre fluid bolus post late hemodialysis. Improving blood pressure. Good LV systolic function. Mild to moderate carotid disease with antegrade flow.  Objective:  Vital Signs in the last 24 hours: Temp:  [97.7 F (36.5 C)-98.3 F (36.8 C)] 98.1 F (36.7 C) (10/20 1817) Pulse Rate:  [79-90] 85 (10/20 1817) Cardiac Rhythm:  [-] Normal sinus rhythm (10/20 1756) Resp:  [14-20] 16 (10/20 1755) BP: (82-135)/(42-61) 103/50 mmHg (10/20 1817) SpO2:  [95 %-100 %] 100 % (10/20 1817) Weight:  [69.627 kg (153 lb 8 oz)-72.3 kg (159 lb 6.3 oz)] 72.3 kg (159 lb 6.3 oz) (10/20 1755)  Physical Exam: BP Readings from Last 1 Encounters:  05/26/15 103/50    Wt Readings from Last 1 Encounters:  05/26/15 72.3 kg (159 lb 6.3 oz)    Weight change:   HEENT: [place]/AT, Eyes-Brown, PERL, EOMI, Conjunctiva-Pink, Sclera-Non-icteric Neck: No JVD, No bruit, Trachea midline. Lungs:  Clear, Bilateral. Cardiac:  Regular rhythm, normal S1 and S2, no S3. II/VI systolic murmur Abdomen:  Soft, non-tender. Extremities:  No edema present. No cyanosis. No clubbing. CNS: AxOx3, Cranial nerves grossly intact, moves all 4 extremities. Right handed. Skin: Warm and dry. Skin bruises as before.   Intake/Output from previous day: 10/19 0701 - 10/20 0700 In: 720 [P.O.:720] Out: 0     Lab Results: BMET    Component Value Date/Time   NA 132* 05/26/2015 0231   NA 140 05/25/2015 1515   NA 137 08/05/2014 0851   K 4.7 05/26/2015 0231   K 6.6* 05/25/2015 1515   K 4.5 08/05/2014 0851   CL 92* 05/26/2015 0231   CL 93* 05/25/2015 1515   CL 98 08/05/2014 0851   CO2 24 05/26/2015 0231   CO2 25 05/25/2015 1515   CO2 26 08/05/2014 0851   GLUCOSE 103* 05/26/2015 0231   GLUCOSE 73 05/25/2015 1515   GLUCOSE 112* 08/05/2014 0851   BUN 93* 05/26/2015 0231   BUN 85* 05/25/2015 1515   BUN 21 08/05/2014 0851   CREATININE 12.61* 05/26/2015 0231   CREATININE 12.98*  05/25/2015 1515   CREATININE 5.57* 08/05/2014 0851   CALCIUM 7.6* 05/26/2015 0231   CALCIUM 9.0 05/25/2015 1515   CALCIUM 8.8 08/05/2014 0851   GFRNONAA 3* 05/26/2015 0231   GFRNONAA 2* 05/25/2015 1515   GFRNONAA 7* 08/05/2014 0851   GFRAA 3* 05/26/2015 0231   GFRAA 3* 05/25/2015 1515   GFRAA 8* 08/05/2014 0851   CBC    Component Value Date/Time   WBC 6.9 05/26/2015 0231   RBC 3.93 05/26/2015 0231   RBC 3.08* 03/09/2012 0500   HGB 11.8* 05/26/2015 0231   HCT 34.5* 05/26/2015 0231   PLT 153 05/26/2015 0231   MCV 87.8 05/26/2015 0231   MCH 30.0 05/26/2015 0231   MCHC 34.2 05/26/2015 0231   RDW 17.3* 05/26/2015 0231   LYMPHSABS 2.6 05/25/2015 1515   MONOABS 0.9 05/25/2015 1515   EOSABS 0.6 05/25/2015 1515   BASOSABS 0.0 05/25/2015 1515   HEPATIC Function Panel  Recent Labs  08/03/14 1848 05/25/15 1515  PROT 6.7 7.9   HEMOGLOBIN A1C No components found for: HGA1C,  MPG CARDIAC ENZYMES Lab Results  Component Value Date   CKTOTAL 84 03/08/2012   TROPONINI <0.03 08/03/2014   BNP No results for input(s): PROBNP in the last 8760 hours. TSH  Recent Labs  08/04/14 0550 03/01/15 1430 05/26/15 1040  TSH 10.537* 7.33* 1.166   CHOLESTEROL  Recent Labs  03/01/15 1430  CHOL 139    Scheduled Meds: . allopurinol  100 mg Oral Daily  . atorvastatin  40 mg Oral Daily  . Chlorhexidine Gluconate Cloth  6 each Topical Q0600  . clonazePAM  1 mg Oral BID  . colchicine  0.6 mg Oral Daily  . docusate sodium  100 mg Oral BID  . doxercalciferol  4 mcg Intravenous Q T,Th,Sa-HD  . DULoxetine  60 mg Oral Daily  . [START ON 05/27/2015] famotidine  20 mg Oral Daily  . heparin  5,000 Units Subcutaneous 3 times per day  . hydrocortisone cream   Topical BID  . Influenza vac split quadrivalent PF  0.5 mL Intramuscular Tomorrow-1000  . levothyroxine  175 mcg Oral QAC breakfast  . montelukast  10 mg Oral QHS  . multivitamin  1 tablet Oral QHS  . mupirocin ointment  1  application Nasal BID  . pantoprazole  40 mg Oral Daily  . pregabalin  75 mg Oral Daily  . QUEtiapine  100 mg Oral QHS  . sevelamer carbonate  1,600 mg Oral TID WC  . 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 Old lacunar infarct  Increase activity. Home in AM     Dixie Dials  MD  05/26/2015, 7:38 PM

## 2015-05-26 NOTE — Procedures (Signed)
  I was present at this dialysis session, have reviewed the session itself and made  appropriate changes Kelly Splinter MD [place] pager [phone]    cell [phone] 05/26/2015, 2:33 PM

## 2015-05-26 NOTE — Progress Notes (Signed)
Hemodialysis- Tolerated well. No uf removed, gave 1L bolus per order. Pt has no complaints. Report called to primary RN.

## 2015-05-27 DIAGNOSIS — R55 Syncope and collapse: Secondary | ICD-10-CM | POA: Diagnosis not present

## 2015-05-27 LAB — GLUCOSE, CAPILLARY: Glucose-Capillary: 82 mg/dL (ref 65–99)

## 2015-05-27 MED ORDER — CLONAZEPAM 1 MG PO TABS: 1.0000 mg | ORAL_TABLET | Freq: Two times a day (BID) | ORAL | Status: DC

## 2015-05-27 MED ORDER — MUPIROCIN 2 % EX OINT: 1.0000 "application " | TOPICAL_OINTMENT | Freq: Two times a day (BID) | CUTANEOUS | Status: DC

## 2015-05-27 MED ORDER — HYDROXYZINE HCL 10 MG PO TABS: 25.0000 mg | ORAL_TABLET | Freq: Three times a day (TID) | ORAL | Status: DC | PRN

## 2015-05-27 MED ORDER — LACTULOSE 10 GM/15ML PO SOLN: 20.0000 g | ORAL | Status: DC

## 2015-05-27 NOTE — Progress Notes (Signed)
For DC today.  We will raise her dry weight to 71kg, she has gained significant body weight.    Kelly Splinter MD [employer] pager [phone]    cell [phone] 05/27/2015, 10:15 AM

## 2015-05-27 NOTE — Discharge Summary (Signed)
Physician Discharge Summary  Patient ID: [name] [name] MRN: [id number] DOB/AGE: 09-08-1940 74 y.o.  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  Discharge Diagnoses:  Principal Problem: * Syncope * Active Problems:   Hypotension   Anxiety state   Hypothyroidism   HLD (hyperlipidemia)   Gout   RESTLESS LEG SYNDROME   H/O Essential hypertension   Backache   DM, II   Anemia of chronic disease   Tobacco use disorder   ESRD on hemodialysis   PVD with left and right 2nd toe amputations   Hip pain.   Old lacunar infarct  Discharged Condition: fair  Hospital Course: 74 year old female presents with syncopal episode day before admission without chest pain or palpitaion. She has history of hypertension, DM, II, Dyslipidemia, Gout, anemia, tobacco use disorder, Anxiety and depression, ESRD on hemodialysis, PVD with left and right 2nd toe amputations, back pain and hip pain. She underwent neuro work up showing old lacunar infarct. She felt better post IV fluids by nephrology. She was discharged home with home health aid. She will be followed by me in 1 week and by primary care in 1 month.  Consults: nephrology  Significant Diagnostic Studies: labs: Near normal CBC, BMET except elevated potassium responding to oral kayexalate and elevated BUN/CR as expected.  MRI/MRA: 1. 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 2. Flow is preserved in the posterior cerebral arteries bilaterally which are both fed the a prominent posterior communicating arteries. 3. A small right P1 segment feeds the basilar tip. 4. PICA arteries are visualized bilaterally. The left vertebral artery terminates at the left PICA. 5. Mild  atrophy and white matter disease. 6. Lacunar infarct in the inferior left cerebellum is new since the prior study, but not acute.  Echocardiogram:  Left ventricle: The cavity size was normal. There was mild concentric hypertrophy. Systolic function was normal. The estimated ejection fraction was in the range of 60% to 65%. Wallmotion was normal; there were no regional wall motionabnormalities. Doppler parameters are consistent with abnormalleft ventricular relaxation (grade 1 diastolic dysfunction). - Mitral valve: There was mild regurgitation. - Left atrium: The atrium was mildly dilated.   Carotid US: Bilateral: severe calcific plaque origin and proximal ICA and ECA. Right: 40-59% ICA stenosis. Left: 60-79% ICA stenosis. ECA stenosis. Vertebral artery flow is antegrade. ICA/CCA ratio: R-1.37 L-3.8.  Treatments: IV hydration  Discharge Exam: Blood pressure 103/44, pulse 80, temperature 98.1 F (36.7 C), temperature source Oral, resp. rate 18, height '5\' 7"'$  (1.702 m), weight 71.85 kg (158 lb 6.4 oz), SpO2 96 %.  HEENT: Normocephalic. Small bruise left upper eye-lid. Mucous membranes pale pink; PERRLA; EOM intact; No scleral icterus, Nares: Patent, Oropharynx: Clear, Edentulous or Fair Dentition, Neck: FROM, no cervical lymphadenopathy nor thyromegaly or carotid bruit; no JVD; CHEST WALL: No tenderness CHEST: Normal respiration, clear to auscultation bilaterally. HEART: Regular rate and rhythm; no rubs or gallops. II/VI systolic murmur. BACK: No kyphosis or scoliosis; no CVA tenderness ABDOMEN: Positive Bowel Sounds, Scaphoid, Obese, soft non-tender; no masses, no organomegaly. EXTREMITIES: No bone or joint deformity; age-appropriate arthropathy of the hands and knees; no cyanosis, clubbing or edema; no ulcerations. Right hypothenar area bruise. Left elbow small bruise.  PULSES: 2+ and symmetric SKIN: Normal hydration no rash or  ulceration CNS: Cranial nerves 2-12 grossly intact no  focal neurologic deficit  Disposition: 01-Home or Self Care     Medication List    STOP taking these medications        cinacalcet 30 MG tablet  Commonly known as:  SENSIPAR      TAKE these medications        acetaminophen 325 MG tablet  Commonly known as:  TYLENOL  Take 2 tablets (650 mg total) by mouth every 6 (six) hours as needed for mild pain (or Fever >/= 101).     albuterol 108 (90 BASE) MCG/ACT inhaler  Commonly known as:  PROVENTIL HFA;VENTOLIN HFA  Inhale 2 puffs into the lungs every 6 (six) hours as needed for wheezing or shortness of breath.     allopurinol 100 MG tablet  Commonly known as:  ZYLOPRIM  Take 100 mg by mouth daily.     atorvastatin 40 MG tablet  Commonly known as:  LIPITOR  TAKE ONE TABLET BY MOUTH ONCE DAILY     benzonatate 100 MG capsule  Commonly known as:  TESSALON  Take 100-200 mg by mouth every 6 (six) hours as needed for cough.     clonazePAM 1 MG tablet  Commonly known as:  KLONOPIN  Take 1 tablet (1 mg total) by mouth 2 (two) times daily. As needed     colchicine 0.6 MG tablet  Take 0.6 mg by mouth daily.     darbepoetin 25 MCG/0.42ML Soln injection  Commonly known as:  ARANESP  Inject 25 mcg into the vein every 7 (seven) days. Every Thursday at dialysis     docusate sodium 100 MG capsule  Commonly known as:  COLACE  Take 100 mg by mouth 2 (two) times daily.     DULERA 100-5 MCG/ACT Aero  Generic drug:  mometasone-formoterol  INHALE TWO PUFFS INTO LUNGS TWICE DAILY     DULoxetine 30 MG capsule  Commonly known as:  CYMBALTA  Take 2 capsules (60 mg total) by mouth daily.     famotidine 40 MG tablet  Commonly known as:  PEPCID  Take 1 tablet (40 mg total) by mouth 2 (two) times daily.     HECTOROL 4 MCG/2ML injection  Generic drug:  doxercalciferol  Inject 4 mcg into the vein Every Tuesday,Thursday,and Saturday with dialysis.     HYDROcodone-acetaminophen 5-325 MG tablet  Commonly known as:  NORCO/VICODIN  Take 1  tablet by mouth every 6 (six) hours as needed for severe pain.     hydrOXYzine 10 MG tablet  Commonly known as:  ATARAX/VISTARIL  Take 2.5 tablets (25 mg total) by mouth 3 (three) times daily as needed for anxiety.     lactulose 10 GM/15ML solution  Commonly known as:  CHRONULAC  Take 30 mLs (20 g total) by mouth every other day.     levothyroxine 175 MCG tablet  Commonly known as:  SYNTHROID, LEVOTHROID  Take 1 tablet (175 mcg total) by mouth daily before breakfast.     montelukast 10 MG tablet  Commonly known as:  SINGULAIR  TAKE ONE TABLET BY MOUTH ONCE DAILY AT BEDTIME     multivitamin Tabs tablet  Take 1 tablet by mouth daily.     mupirocin ointment 2 %  Commonly known as:  BACTROBAN  Place 1 application into the nose 2 (two) times daily.     ondansetron 4 MG tablet  Commonly known as:  ZOFRAN  Take 4 mg by mouth every 6 (six) hours  as needed for nausea or vomiting.     pregabalin 50 MG capsule  Commonly known as:  LYRICA  Take 50 mg by mouth daily.     QUEtiapine 100 MG tablet  Commonly known as:  SEROQUEL  TAKE ONE TABLET BY MOUTH AT BEDTIME     sevelamer carbonate 800 MG tablet  Commonly known as:  RENVELA  Take 3,200 mg by mouth 3 (three) times daily with meals.           Follow-up Information    Follow up with Cathlean Cower, MD. Schedule an appointment as soon as possible for a visit in 1 month.   Specialties:  Internal Medicine, Radiology   Contact information:   [address] [place] [address] [phone]       Follow up with [facility], MD. Schedule an appointment as soon as possible for a visit in 1 week.   Specialty:  Cardiology   Contact information:   [place] [address] [phone]       Signed: Birdie Riddle 05/27/2015, 9:45 AM

## 2015-05-27 NOTE — Plan of Care (Signed)
Problem: Phase I Progression Outcomes Goal: Voiding-avoid urinary catheter unless indicated Outcome: Not Applicable Date Met:  46/19/01 Patient is a dialysis patient and has oliguria

## 2015-05-27 NOTE — Evaluation (Signed)
Physical Therapy Evaluation Patient Details Name: [name] [name] MRN: [id number] DOB: [birth year] Today's Date: 05/27/2015   History of Present Illness  Pt is a 74 y/o female presenting with syncopal episode without chest pain or palpitaion. 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  Clinical Impression  Pt presents with impairments as indicated below.  Pt will benefit from further acute PT services to maximize safety and independence with functional mobility.  PT recommends HHPT following discharge.    Follow Up Recommendations Home health PT    Equipment Recommendations  None recommended by PT    Recommendations for Other Services       Precautions / Restrictions Precautions Precautions: Fall Restrictions Weight Bearing Restrictions: No      Mobility  Bed Mobility Overal bed mobility: Modified Independent             General bed mobility comments: increased time; mild lightheadedness with supine to sit but this resolved quickly  Transfers Overall transfer level: Modified independent Equipment used: None                Ambulation/Gait Ambulation/Gait assistance: Supervision (VCs for proper use of RW) Ambulation Distance (Feet): 110 Feet Assistive device: Rolling walker (2 wheeled);None (55' walking without AD and 25' with RW) Gait Pattern/deviations: Step-through pattern;Decreased stride length;Antalgic Gait velocity: slow Gait velocity interpretation: Below normal speed for age/gender General Gait Details: pt with bilateral foot pain with weight bearing which she attributes to falling prior to admission.  She was slightly unsteady without AD and demonstrated improved stability and safety when using RW.  Stairs            Wheelchair Mobility    Modified Rankin (Stroke Patients Only)       Balance Overall  balance assessment: Needs assistance Sitting-balance support: No upper extremity supported;Feet supported Sitting balance-Leahy Scale: Good       Standing balance-Leahy Scale: Good                               Pertinent Vitals/Pain Pain Assessment: Faces Faces Pain Scale: Hurts even more Pain Location: bilateral feet with weight bearing which she states is from falling before coming to the hospital; L shoulder pain Pain Descriptors / Indicators: Guarding;Grimacing Pain Intervention(s): Monitored during session    Home Living Family/patient expects to be discharged to:: Private residence Living Arrangements: Alone Available Help at Discharge: Friend(s);Available PRN/intermittently Type of Home: House Home Access: Stairs to enter Entrance Stairs-Rails: Can reach both Entrance Stairs-Number of Steps: 3 Home Layout: Multi-level;Able to live on main level with bedroom/bathroom Home Equipment: Gilford Rile - 2 wheels;Cane - single point;Bedside commode Additional Comments: Pt states that she was using no AD at home    Prior Function Level of Independence: Independent               Hand Dominance   Dominant Hand: Right    Extremity/Trunk Assessment   Upper Extremity Assessment: Overall WFL for tasks assessed           Lower Extremity Assessment: Overall WFL for tasks assessed         Communication   Communication: No difficulties  Cognition Arousal/Alertness: Awake/alert Behavior During Therapy: WFL for tasks assessed/performed Overall Cognitive Status: Within Functional Limits for tasks assessed  General Comments General comments (skin integrity, edema, etc.): Ptstates that at home she sometimes gets lightheaded when shes walking.  She also notes that she always forgets to reach back to sit in the chair behind her and that she is afraid she might fall because of this.  Pt has been educated on safety with functional mobility  at home.    Exercises        Assessment/Plan    PT Assessment Patient needs continued PT services  PT Diagnosis Difficulty walking;Abnormality of gait   PT Problem List Decreased activity tolerance;Decreased balance;Decreased mobility;Decreased knowledge of use of DME;Decreased safety awareness  PT Treatment Interventions DME instruction;Gait training;Stair training;Functional mobility training;Therapeutic activities;Therapeutic exercise;Balance training;Patient/family education   PT Goals (Current goals can be found in the Care Plan section) Acute Rehab PT Goals Patient Stated Goal: to get home PT Goal Formulation: With patient Time For Goal Achievement: 06/10/15 Potential to Achieve Goals: Good    Frequency Min 3X/week   Barriers to discharge Decreased caregiver support (pt lives alone)      Co-evaluation               End of Session Equipment Utilized During Treatment: Gait belt Activity Tolerance: Patient tolerated treatment well Patient left: in chair;with call bell/phone within reach;with chair alarm set Nurse Communication: Mobility status         Time: 3888-2800 PT Time Calculation (min) (ACUTE ONLY): 15 min   Charges:   PT Evaluation $Initial PT Evaluation Tier I: 1 Procedure     PT G Codes:   PT G-Codes **NOT FOR INPATIENT CLASS** Functional Assessment Tool Used: clinical judgement Functional Limitation: Mobility: Walking and moving around Mobility: Walking and Moving Around Current Status (L4917): At least 1 percent but less than 20 percent impaired, limited or restricted Mobility: Walking and Moving Around Goal Status [phone]): 0 percent impaired, limited or restricted    Lorita Officer 05/27/2015, 11:53 AM  Lorita Officer, SPT

## 2015-05-27 NOTE — Progress Notes (Signed)
At 1152 pt received all d/c instructions. Verbalized understanding.  D/c off floor via w/c to awaiting transport.  Karie Kirks, Therapist, sports.

## 2015-05-30 ENCOUNTER — Ambulatory Visit (INDEPENDENT_AMBULATORY_CARE_PROVIDER_SITE_OTHER): Payer: Medicare Other | Admitting: Podiatry

## 2015-05-30 ENCOUNTER — Ambulatory Visit (INDEPENDENT_AMBULATORY_CARE_PROVIDER_SITE_OTHER): Payer: Medicare Other

## 2015-05-30 ENCOUNTER — Encounter: Payer: Self-pay | Admitting: Podiatry

## 2015-05-30 VITALS — BP 105/61 | HR 72 | Resp 18

## 2015-05-30 DIAGNOSIS — M79673 Pain in unspecified foot: Secondary | ICD-10-CM

## 2015-05-30 DIAGNOSIS — S9030XA Contusion of unspecified foot, initial encounter: Secondary | ICD-10-CM | POA: Diagnosis not present

## 2015-05-30 DIAGNOSIS — L84 Corns and callosities: Secondary | ICD-10-CM | POA: Diagnosis not present

## 2015-05-30 DIAGNOSIS — I739 Peripheral vascular disease, unspecified: Secondary | ICD-10-CM

## 2015-05-30 DIAGNOSIS — M79676 Pain in unspecified toe(s): Secondary | ICD-10-CM

## 2015-05-30 DIAGNOSIS — B351 Tinea unguium: Secondary | ICD-10-CM | POA: Diagnosis not present

## 2015-05-30 NOTE — Progress Notes (Signed)
Patient ID: Rada Hay Lanagan, female   DOB: Nov 17, 1940, 74 y.o.   MRN: [id number]  Subjective:  patient presents the office today for complaints of thick, painful, elongated toenails which she has and position herself. She denies any redness strands of the nail sites. She also states that fossae one week ago she did fall she was in the hospital after her fall. Since then she states that she fell injuring her toes to both feet. She said it has gotten better although since the fall she has had some pain to her feet. She had some swelling to her feet as well as she states that she had swelling to her feet before this. She denies any redness or increase in warmth. She said no previous treatment. No other complaints at this time in no acute changes. She denies any calf pain, chest pain, soreness of breath.  Objective: AAO 3, NAD DP/PT pulses slightly palpable Protective sensation decreased with Simms Weinstein monofilament Nails are hypertrophic, dystrophic, brittle, discolored, elongated to the left third, fourth, fifth digit in the right hallux, third, fourth, fifth digit. There is mild tenderness on palpation on the nails. There is no surrounding erythema or drainage. There is a hyperkeratotic lesion distal aspect of the right third toe. Upon debridement no underlying ulceration, drainage or other sign of infection. There are no areas of pinpoint bony tenderness or pain the vibratory sensation of bilateral lower extremities suggest that she has pain overlying her toes although is no pain upon palpation of this time.. There is mild overlying edema to the foot with a left worse than the right although it appears to be chronic without any evidence of erythema or increase in warmth. She has a previous  Amputation of the left hallux and second toe as well as the right second toe. No other open lesions or pre-ulcer lesions identified bilaterally. No pain with calf compression, swelling, warmth, erythema.   Assessment:  74 year old female with symptomatic onychomycosis,  Hyperkeratotic lesion,  Subjective pain status post fall bilateral feet   Plan: -X-rays were obtained and reviewed with the patient.  -Treatment options discussed including all alternatives, risks, and complications -Nails are sharply debrided without complication/bleeding 7. -Hyperkeratotic lesion 1 sharply debrided without complication/bleeding -Although she surgically has pain to both of her feet since her fall there was no pain upon palpation on clinical exam. X-rays were also obtained. Follow-up with me if the swelling is not improved within 4 weeks' sugars continued pain. Otherwise I'll follow up with her in 3 months or routine care. Call the office with any question, concerns, change in symptoms in the meantime.  Celesta Gentile, DPM

## 2015-06-09 ENCOUNTER — Inpatient Hospital Stay: Payer: Medicare Other | Admitting: Internal Medicine

## 2015-06-10 ENCOUNTER — Other Ambulatory Visit: Payer: Self-pay | Admitting: Internal Medicine

## 2015-06-10 ENCOUNTER — Inpatient Hospital Stay: Payer: Medicare Other | Admitting: Internal Medicine

## 2015-06-15 ENCOUNTER — Inpatient Hospital Stay: Payer: Medicare Other | Admitting: Internal Medicine

## 2015-06-15 ENCOUNTER — Encounter: Payer: Self-pay | Admitting: Internal Medicine

## 2015-06-15 ENCOUNTER — Ambulatory Visit (INDEPENDENT_AMBULATORY_CARE_PROVIDER_SITE_OTHER): Payer: Medicare Other | Admitting: Internal Medicine

## 2015-06-15 VITALS — BP 124/86 | HR 92 | Ht 67.0 in | Wt 165.0 lb

## 2015-06-15 DIAGNOSIS — E039 Hypothyroidism, unspecified: Secondary | ICD-10-CM

## 2015-06-15 DIAGNOSIS — Z992 Dependence on renal dialysis: Secondary | ICD-10-CM

## 2015-06-15 DIAGNOSIS — J449 Chronic obstructive pulmonary disease, unspecified: Secondary | ICD-10-CM

## 2015-06-15 DIAGNOSIS — R7302 Impaired glucose tolerance (oral): Secondary | ICD-10-CM

## 2015-06-15 DIAGNOSIS — Z23 Encounter for immunization: Secondary | ICD-10-CM | POA: Diagnosis not present

## 2015-06-15 DIAGNOSIS — R059 Cough, unspecified: Secondary | ICD-10-CM | POA: Insufficient documentation

## 2015-06-15 DIAGNOSIS — R05 Cough: Secondary | ICD-10-CM

## 2015-06-15 DIAGNOSIS — N186 End stage renal disease: Secondary | ICD-10-CM | POA: Diagnosis not present

## 2015-06-15 MED ORDER — MOMETASONE FURO-FORMOTEROL FUM 100-5 MCG/ACT IN AERO: INHALATION_SPRAY | RESPIRATORY_TRACT | Status: DC

## 2015-06-15 MED ORDER — AZITHROMYCIN 250 MG PO TABS: ORAL_TABLET | ORAL | Status: DC

## 2015-06-15 MED ORDER — ALBUTEROL SULFATE HFA 108 (90 BASE) MCG/ACT IN AERS
2.0000 | INHALATION_SPRAY | Freq: Four times a day (QID) | RESPIRATORY_TRACT | Status: AC | PRN
Start: 2015-06-15 — End: ?

## 2015-06-15 NOTE — Progress Notes (Signed)
Pre visit review using our clinic review tool, if applicable. No additional management support is needed unless otherwise documented below in the visit note. 

## 2015-06-15 NOTE — Addendum Note (Signed)
Addended by: Biagio Borg on: 06/15/2015 11:46 AM   Modules accepted: Orders

## 2015-06-15 NOTE — Assessment & Plan Note (Signed)
C/w bronchitis, for zpack x 1,  to f/u any worsening symptoms or concerns, cont otc mucinex prn as well

## 2015-06-15 NOTE — Assessment & Plan Note (Signed)
To cont HD tues/thur/sat, now running on the the higher volume side it seems with recent overdialysis/volume low leading to syncope,  to f/u any worsening symptoms or concerns

## 2015-06-15 NOTE — Progress Notes (Signed)
Subjective:    Patient ID: [name] [name], female    DOB: 12/12/1940, 74 y.o.   MRN: [id number]  HPI  Here to f/u recent syncope felt possibly due to overDialysis and volume depletion, with fall and small lac to left temple area, with improvement in hosp with IVF per nephrology.  Wt has increased significantly.  She's not sure of what her dry wt is supposed to be.   Wt Readings from Last 3 Encounters:  06/15/15 165 lb (74.844 kg)  05/27/15 158 lb 6.4 oz (71.85 kg)  03/01/15 147 lb (66.679 kg)  Now with increased left > right ankle edema, not painful, no joint or skin pain.Out of dulera and albut MDI, needs restart per pt, still taking mucinex daily as well.  Has known carotid stenosis, has f/u appt with dr Doylene Canard next wk, may not need surgury soon.  Incidnetly , Here with acute onset mild to mod 2-3 days ST, HA, general weakness and malaise, with prod cough greenish sputum, but Pt denies chest pain, increased sob or doe, wheezing, orthopnea, PND, increased LE swelling, palpitations, dizziness or syncope.  Denies hyper or hypo thyroid symptoms such as voice, skin or hair change. Past Medical History  Diagnosis Date  . HYPOTHYROIDISM 02/17/2007    s/p surgical removal of goiter in 1997  . DIABETES MELLITUS, TYPE II 02/17/2007  . HYPERLIPIDEMIA 02/17/2007  . GOUT 05/29/2007  . ANEMIA-NOS 05/29/2007  . ANXIETY 03/23/2010  . 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 DEPRESSION 02/17/2007  . RESTLESS LEG SYNDROME 05/29/2007  . COMMON MIGRAINE 05/29/2007  . PERIPHERAL NEUROPATHY 05/29/2007  . HYPERTENSION 02/17/2007  . PERIPHERAL VASCULAR DISEASE 02/17/2007  . PEPTIC ULCER DISEASE 05/29/2007  . ESRD on hemodialysis ([place]) 02/17/2007    Started dialysis April 2014.  Gets HD at [facility] on TTS schedule.  Cause of ESRD was HTN.     . OSTEOPENIA 09/22/2009  . SEIZURE DISORDER 02/17/2007  . Memory loss 01/24/2010  . Personal history of colonic polyps 11/16/2009  . Palpitations 09/08/2010  . Other diseases of vocal  cords 1996  . HEART MURMUR, HX OF 10/02/2010  . 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 Chronic neck pain 12/13/2010  . GERD (gastroesophageal reflux disease)   . 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 Cancer of kidney ([place]) 10/07/2012    Followed per Dr Despina Pole, MD, urology, [place]   . Arthritis   . Critical lower limb ischemia    Past Surgical History  Procedure Laterality Date  . Left toe amputated  2006  . Bunionectomy  1980  . Goiter removal  1997  . Stress cardiolite  06/18/2006  . Tranthoracic echocardiogram  06/18/2006  . Electrocardiogram  05/29/2006  . Cholecystectomy    . Rotator cuff repair  Left    Dr. Sharol Given  . Av fistula placement  03/13/2012    Procedure: ARTERIOVENOUS (AV) FISTULA CREATION;  Surgeon: Conrad Fletcher, MD;  Location: [facility];  Service: Vascular;  Laterality: Right;  . Eye surgery      bilateral cataract removal  . Esophagoscopy w/ botox injection  07/22/2012    Procedure: ESOPHAGOSCOPY WITH BOTOX INJECTION;  Surgeon: Rozetta Nunnery, MD;  Location: [place];  Service: ENT;  Laterality: N/A;  esophageal dilation  . Insertion of dialysis catheter N/A 02/05/2013    Procedure: INSERTION OF DIALYSIS CATHETER;  Surgeon: Angelia Mould, MD;  Location: [facility];  Service: Vascular;  Laterality: N/A;  Ultrasound guided  . Toe amputation Right Aug. 2015    2nd     reports that she has been smoking Cigarettes.  She has a 11.75 pack-year smoking history. She has never used smokeless tobacco. She reports that she does not drink alcohol or use illicit drugs. family history includes Coronary artery disease in her other; Dementia in her mother; Heart attack in her brother; Hyperlipidemia in her other; Hypertension in her brother, mother, other, and sister; Ovarian cancer in her other; Stroke in her brother and other. Allergies  Allergen Reactions  . Nsaids     REACTION: renal dysfunction  . Vancomycin       See comment under cephalosporin   Current Outpatient Prescriptions on File Prior to Visit  Medication Sig Dispense Refill  . acetaminophen (TYLENOL) 325 MG tablet Take 2 tablets (650 mg total) by mouth every 6 (six) hours as needed for mild pain (or Fever >/= 101).    Marland Kitchen allopurinol (ZYLOPRIM) 100 MG tablet Take 100 mg by mouth daily.     Marland Kitchen atorvastatin (LIPITOR) 40 MG tablet TAKE ONE TABLET BY MOUTH ONCE DAILY 30 tablet 5  . benzonatate (TESSALON) 100 MG capsule Take 100-200 mg by mouth every 6 (six) hours as needed for cough.    . clonazePAM (KLONOPIN) 1 MG tablet Take 1 tablet (1 mg total) by mouth 2 (two) times daily. As needed    . colchicine 0.6 MG tablet Take 0.6 mg by mouth daily.     . darbepoetin (ARANESP) 25 MCG/0.42ML SOLN injection Inject 25 mcg into the vein every 7 (seven) days. Every Thursday at dialysis    . docusate sodium (COLACE) 100 MG capsule Take 100 mg by mouth 2 (two) times daily.     Marland Kitchen doxercalciferol (HECTOROL) 4 MCG/2ML injection Inject 4 mcg into the vein Every Tuesday,Thursday,and Saturday with dialysis.    . DULoxetine (CYMBALTA) 30 MG capsule Take 2 capsules (60 mg total) by mouth daily. 60 capsule 11  . famotidine (PEPCID) 40 MG tablet Take 1 tablet (40 mg total) by mouth 2 (two) times daily. (Patient taking differently: Take 40 mg by mouth daily. ) 60 tablet 11  . HYDROcodone-acetaminophen (NORCO/VICODIN) 5-325 MG per tablet Take 1 tablet by mouth every 6 (six) hours as needed for severe pain. 30 tablet 0  . hydrOXYzine (ATARAX/VISTARIL) 10 MG tablet Take 2.5 tablets (25 mg total) by mouth 3 (three) times daily as needed for anxiety. 30 tablet 0  . lactulose (CHRONULAC) 10 GM/15ML solution Take 30 mLs (20 g total) by mouth every other day. 473 mL 3  . levothyroxine (SYNTHROID, LEVOTHROID) 175 MCG tablet Take 1 tablet (175 mcg total) by mouth daily before breakfast. 90 tablet 3  . montelukast (SINGULAIR) 10 MG tablet TAKE ONE TABLET BY MOUTH ONCE DAILY AT  BEDTIME 30 tablet 5  . multivitamin (RENA-VIT) TABS tablet Take 1 tablet by mouth daily.    . mupirocin ointment (BACTROBAN) 2 % Place 1 application into the nose 2 (two) times daily. 22 g 0  . ondansetron (ZOFRAN) 4 MG tablet Take 4 mg by mouth every 6 (six) hours as needed for nausea or vomiting.    . pregabalin (LYRICA) 50 MG capsule Take 50 mg by mouth daily.    . QUEtiapine (SEROQUEL) 100 MG tablet TAKE ONE TABLET BY MOUTH AT BEDTIME 90 tablet 1  . sevelamer carbonate (RENVELA) 800 MG tablet Take 3,200 mg by mouth 3 (three) times daily with meals.  No current facility-administered medications on file prior to visit.     Review of Systems  Constitutional: Negative for unusual diaphoresis or night sweats HENT: Negative for ringing in ear or discharge Eyes: Negative for double vision or worsening visual disturbance.  Respiratory: Negative for choking and stridor.   Gastrointestinal: Negative for vomiting or other signifcant bowel change Genitourinary: Negative for hematuria or change in urine volume.  Musculoskeletal: Negative for other MSK pain or swelling Skin: Negative for color change and worsening wound.  Neurological: Negative for tremors and numbness other than noted  Psychiatric/Behavioral: Negative for decreased concentration or agitation other than above       Objective:   Physical Exam BP 124/86 mmHg  Pulse 92  Ht '5\' 7"'$  (1.702 m)  Wt 165 lb (74.844 kg)  BMI 25.84 kg/m2  SpO2 98% VS noted, mild ill Constitutional: Pt appears in no significant distress HENT: Head: NCAT.  Right Ear: External ear normal.  Left Ear: External ear normal.  Eyes: . Pupils are equal, round, and reactive to light. Conjunctivae and EOM are normal Bilat tm's with mild erythema.  Max sinus areas non tender.  Pharynx with mild erythema, no exudate Neck: Normal range of motion. Neck supple.  Cardiovascular: Normal rate and regular rhythm.   Pulmonary/Chest: Effort normal and breath sounds  without rales or wheezing.  Neurological: Pt is alert. Not confused , motor grossly intact Skin: Skin is warm. No rash, trace to 1+ left > right ankle LE edema Psychiatric: Pt behavior is normal. No agitation.     Assessment & Plan:

## 2015-06-15 NOTE — Assessment & Plan Note (Signed)
stable overall by history and exam, recent data reviewed with pt, and pt to continue medical treatment as before,  to f/u any worsening symptoms or concerns Lab Results  Component Value Date   TSH 1.166 05/26/2015

## 2015-06-15 NOTE — Assessment & Plan Note (Signed)
stable overall by history and exam, recent data reviewed with pt, and pt to continue medical treatment as before,  to f/u any worsening symptoms or concerns Lab Results  Component Value Date   HGBA1C 5.0 03/01/2015

## 2015-06-15 NOTE — Patient Instructions (Addendum)
Please take all new medication as prescribed - the antibiotic  Please continue all other medications as before, and refills have been done if requested.  Please have the pharmacy call with any other refills you may need.  Please continue your efforts at being more active, low cholesterol diet, and weight control.  Please keep your appointments with your specialists as you may have planned  Please return in 6 months, or sooner if needed

## 2015-06-15 NOTE — Assessment & Plan Note (Signed)
stable overall by history and exam, recent data reviewed with pt, and pt to continue medical treatment as before,  to f/u any worsening symptoms or concerns BP Readings from Last 3 Encounters:  06/15/15 124/86  05/30/15 105/61  05/27/15 103/44

## 2015-06-17 ENCOUNTER — Ambulatory Visit: Payer: Medicare Other | Admitting: Internal Medicine

## 2015-06-19 ENCOUNTER — Other Ambulatory Visit: Payer: Self-pay | Admitting: Internal Medicine

## 2015-06-20 ENCOUNTER — Ambulatory Visit
Admission: RE | Admit: 2015-06-20 | Discharge: 2015-06-20 | Disposition: A | Discharge status: Home / Self Care | Payer: Medicare Other | Source: Ambulatory Visit | Attending: Cardiovascular Disease | Admitting: Cardiovascular Disease

## 2015-06-20 ENCOUNTER — Other Ambulatory Visit: Payer: Self-pay | Admitting: Cardiovascular Disease

## 2015-06-20 DIAGNOSIS — R609 Edema, unspecified: Secondary | ICD-10-CM

## 2015-06-22 ENCOUNTER — Encounter: Payer: Medicare Other | Admitting: Vascular Surgery

## 2015-06-22 ENCOUNTER — Inpatient Hospital Stay: Payer: Medicare Other | Admitting: Internal Medicine

## 2015-07-27 ENCOUNTER — Encounter: Payer: Medicare Other | Admitting: Vascular Surgery

## 2015-08-11 ENCOUNTER — Other Ambulatory Visit: Payer: Self-pay | Admitting: Internal Medicine

## 2015-08-12 ENCOUNTER — Other Ambulatory Visit: Payer: Self-pay | Admitting: Internal Medicine

## 2015-08-12 ENCOUNTER — Encounter: Payer: Self-pay | Admitting: Vascular Surgery

## 2015-08-19 ENCOUNTER — Encounter: Payer: Self-pay | Admitting: Vascular Surgery

## 2015-08-19 ENCOUNTER — Ambulatory Visit (INDEPENDENT_AMBULATORY_CARE_PROVIDER_SITE_OTHER): Payer: Medicare Other | Admitting: Vascular Surgery

## 2015-08-19 VITALS — BP 136/76 | HR 94 | Temp 98.8°F | Resp 16 | Ht 67.5 in | Wt 151.0 lb

## 2015-08-19 DIAGNOSIS — I6523 Occlusion and stenosis of bilateral carotid arteries: Secondary | ICD-10-CM

## 2015-08-19 NOTE — Progress Notes (Signed)
Vascular and Vein Specialist of [facility]  Patient name: [name] [name] MRN: [id number] DOB: Jun 22, 1941 Sex: female  REASON FOR CONSULT: Carotid disease. Referred by Dr. Marval Regal.   HPI: [name] [name] is a 75 y.o. female, who was hospitalized in October 2016 with syncope. Her workup at that time included a carotid duplex scan which showed a 40-59% right carotid stenosis and a 60-79% left carotid stenosis. She is referred for vascular consultation. She is left-handed. She denies any history of stroke, TIAs, expressive or receptive aphasia, or amaurosis fugax.  She dialyzes on Tuesdays Thursdays and Saturdays and has a functioning right upper arm basilic vein transposition. She has not been taking aspirin because she says that she has bleeding problems after dialysis when she is taking aspirin. She is on a statin.  Have reviewed the records that were sent from the kidney center. She does have diabetes which has been under reasonable control. She dialyzes on Tuesdays Thursdays and Saturdays.  Past Medical History  Diagnosis Date  . HYPOTHYROIDISM 02/17/2007    s/p surgical removal of goiter in 1997  . DIABETES MELLITUS, TYPE II 02/17/2007  . HYPERLIPIDEMIA 02/17/2007  . GOUT 05/29/2007  . ANEMIA-NOS 05/29/2007  . ANXIETY 03/23/2010  . 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 DEPRESSION 02/17/2007  . RESTLESS LEG SYNDROME 05/29/2007  . COMMON MIGRAINE 05/29/2007  . PERIPHERAL NEUROPATHY 05/29/2007  . HYPERTENSION 02/17/2007  . PERIPHERAL VASCULAR DISEASE 02/17/2007  . PEPTIC ULCER DISEASE 05/29/2007  . ESRD on hemodialysis ([place]) 02/17/2007    Started dialysis April 2014.  Gets HD at [facility] on TTS schedule.  Cause of ESRD was HTN.     . OSTEOPENIA 09/22/2009  . SEIZURE DISORDER 02/17/2007  . Memory loss 01/24/2010  . Personal history of colonic polyps 11/16/2009  . Palpitations 09/08/2010  . Other diseases of vocal cords 1996  . HEART MURMUR, HX OF 10/02/2010  . 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 Chronic  neck pain 12/13/2010  . GERD (gastroesophageal reflux disease)   . 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 Cancer of kidney ([place]) 10/07/2012    Followed per Dr Despina Pole, MD, urology, [place]   . Arthritis   . Critical lower limb ischemia     Family History  Problem Relation Age of Onset  . Dementia Mother   . Hypertension Mother   . Coronary artery disease Other   . Hyperlipidemia Other   . Hypertension Other   . Ovarian cancer Other   . Stroke Other   . Hypertension Sister   . Hypertension Brother   . Heart attack Brother   . Stroke Brother     SOCIAL HISTORY: Social History   Social History  . Marital Status: Single    Spouse Name: N/A  . Number of Children: N/A  . Years of Education: N/A   Occupational History  . disabled     c-spine and back pain   Social History Main Topics  . Smoking status: Current Some Day Smoker -- 0.25 packs/day for 47 years    Types: Cigarettes  . Smokeless tobacco: Never Used     Comment: USING E CIG  . Alcohol Use: No  . Drug Use: No  . Sexual Activity: Not on file   Other Topics Concern  . Not on file   Social History Narrative   Currently on disability for her back pain.  Lives alone, though has been thinking about moving  to ALF/SNF recently.    Allergies  Allergen Reactions  . Nsaids     REACTION: renal dysfunction  . Vancomycin     See comment under cephalosporin    Current Outpatient Prescriptions  Medication Sig Dispense Refill  . acetaminophen (TYLENOL) 325 MG tablet Take 2 tablets (650 mg total) by mouth every 6 (six) hours as needed for mild pain (or Fever >/= 101).    Marland Kitchen albuterol (PROVENTIL HFA;VENTOLIN HFA) 108 (90 BASE) MCG/ACT inhaler Inhale 2 puffs into the lungs every 6 (six) hours as needed for wheezing or shortness of breath. 1 Inhaler 5  . allopurinol (ZYLOPRIM) 100 MG tablet Take 100 mg by mouth daily.     Marland Kitchen allopurinol (ZYLOPRIM) 100 MG tablet TAKE  ONE TABLET BY MOUTH ONCE DAILY 90 tablet 0  . atorvastatin (LIPITOR) 40 MG tablet TAKE ONE TABLET BY MOUTH ONCE DAILY 30 tablet 5  . azithromycin (ZITHROMAX Z-PAK) 250 MG tablet Use as directed 6 tablet 1  . benzonatate (TESSALON) 100 MG capsule Take 100-200 mg by mouth every 6 (six) hours as needed for cough.    . clonazePAM (KLONOPIN) 1 MG tablet Take 1 tablet (1 mg total) by mouth 2 (two) times daily. As needed    . colchicine 0.6 MG tablet Take 0.6 mg by mouth daily.     . cyclobenzaprine (FLEXERIL) 10 MG tablet TAKE ONE TABLET BY MOUTH THREE TIMES DAILY AS NEEDED FOR MUSCLE SPASM 30 tablet 0  . darbepoetin (ARANESP) 25 MCG/0.42ML SOLN injection Inject 25 mcg into the vein every 7 (seven) days. Every Thursday at dialysis    . docusate sodium (COLACE) 100 MG capsule Take 100 mg by mouth 2 (two) times daily.     Marland Kitchen doxercalciferol (HECTOROL) 4 MCG/2ML injection Inject 4 mcg into the vein Every Tuesday,Thursday,and Saturday with dialysis.    . DULoxetine (CYMBALTA) 30 MG capsule Take 2 capsules (60 mg total) by mouth daily. 60 capsule 11  . famotidine (PEPCID) 40 MG tablet Take 1 tablet (40 mg total) by mouth 2 (two) times daily. (Patient taking differently: Take 40 mg by mouth daily. ) 60 tablet 11  . HYDROcodone-acetaminophen (NORCO/VICODIN) 5-325 MG per tablet Take 1 tablet by mouth every 6 (six) hours as needed for severe pain. 30 tablet 0  . hydrOXYzine (ATARAX/VISTARIL) 10 MG tablet Take 2.5 tablets (25 mg total) by mouth 3 (three) times daily as needed for anxiety. 30 tablet 0  . hydrOXYzine (ATARAX/VISTARIL) 25 MG tablet TAKE ONE TABLET BY MOUTH EVERY 6 HOURS AS NEEDED FOR ANXIETY. 30 tablet 0  . lactulose (CHRONULAC) 10 GM/15ML solution Take 30 mLs (20 g total) by mouth every other day. 473 mL 3  . levothyroxine (SYNTHROID, LEVOTHROID) 175 MCG tablet Take 1 tablet (175 mcg total) by mouth daily before breakfast. 90 tablet 3  . mometasone-formoterol (DULERA) 100-5 MCG/ACT AERO INHALE TWO  PUFFS INTO LUNGS TWICE DAILY 1 Inhaler 5  . montelukast (SINGULAIR) 10 MG tablet TAKE ONE TABLET BY MOUTH ONCE DAILY AT BEDTIME 30 tablet 5  . multivitamin (RENA-VIT) TABS tablet Take 1 tablet by mouth daily.    . mupirocin ointment (BACTROBAN) 2 % Place 1 application into the nose 2 (two) times daily. 22 g 0  . omeprazole (PRILOSEC) 40 MG capsule Take 1 capsule (40 mg total) by mouth daily. --patient needs office visit before any further refills 30 capsule 0  . ondansetron (ZOFRAN) 4 MG tablet Take 4 mg by mouth every 6 (six) hours as needed for  nausea or vomiting.    . pregabalin (LYRICA) 50 MG capsule Take 50 mg by mouth daily.    . QUEtiapine (SEROQUEL) 100 MG tablet TAKE ONE TABLET BY MOUTH AT BEDTIME 90 tablet 0  . sevelamer carbonate (RENVELA) 800 MG tablet Take 3,200 mg by mouth 3 (three) times daily with meals.      No current facility-administered medications for this visit.    REVIEW OF SYSTEMS:  '[X]'$  denotes positive finding, '[ ]'$  denotes negative finding Cardiac  Comments:  Chest pain or chest pressure:    Shortness of breath upon exertion:    Short of breath when lying flat:    Irregular heart rhythm:        Vascular    Pain in calf, thigh, or hip brought on by ambulation:    Pain in feet at night that wakes you up from your sleep:  X   Blood clot in your veins:    Leg swelling:         Pulmonary    Oxygen at home:    Productive cough:     Wheezing:         Neurologic    Sudden weakness in arms or legs:     Sudden numbness in arms or legs:     Sudden onset of difficulty speaking or slurred speech:    Temporary loss of vision in one eye:     Problems with dizziness:         Gastrointestinal    Blood in stool:     Vomited blood:         Genitourinary    Burning when urinating:     Blood in urine:        Psychiatric    Major depression:  X       Hematologic    Bleeding problems:    Problems with blood clotting too easily:        Skin    Rashes or  ulcers: X       Constitutional    Fever or chills:      PHYSICAL EXAM: Filed Vitals:   08/19/15 1313  BP: 136/76  Pulse: 94  Temp: 98.8 F (37.1 C)  TempSrc: Oral  Resp: 16  Height: 5' 7.5" (1.715 m)  Weight: 151 lb (68.493 kg)  SpO2: 97%    GENERAL: The patient is a well-nourished female, in no acute distress. The vital signs are documented above. CARDIAC: There is a regular rate and rhythm.  VASCULAR: she has bilateral carotid bruits. She has palpable femoral pulses are palpable right dorsalis pedis pulse. I cannot palpate pedis pedal pulses on the left side Her right upper arm fistula has an excellent bruit and thrill. PULMONARY: There is good air exchange bilaterally without wheezing or rales. ABDOMEN: Soft and non-tender with normal pitched bowel sounds.  MUSCULOSKELETAL: she's had a previous right second toe amputation which is healed. She has had a previous left first and fifth toe amputations which are healed. NEUROLOGIC: No focal weakness or parsthesias are detected. SKIN: she has a rash over her right upper arm fistula which has been chronic. PSYCHIATRIC: The patient has a normal affect.  DATA:   CAROTID DUPLEX: I have reviewed the carotid duplex scan that was done on 05/26/2015. On the right side there is a 40-59% stenosis. On the left side there is a 60-79% stenosis. Both vertebral arteries had antegrade flow.  MEDICAL ISSUES:  BILATERAL CAROTID STENOSES: This patient has asymptomatic bilateral  carotid stenoses. I do not think that her syncopal episode in October can't be attributed to her carotid disease. Likewise she did not have evidence of significant vertebrobasilar disease on her duplex scan. Given that she is asymptomatic, I would only recommend carotid endarterectomy if the stenosis progressed to greater than 80% or she develops new neurologic symptoms. I have also asked her to try taking 81 mg of aspirin at least several times a week. She stopped taking it  because of bleeding problems after dialysis. I ordered a follow up carotid duplex scan in 6 months and I'll see her back at that time. She knows to call sooner she has problems.   Deitra Mayo [facility] of [place]: [phone]

## 2015-08-19 NOTE — Addendum Note (Signed)
Addended by: Dorthula Rue L on: 08/19/2015 06:00 PM   Modules accepted: Orders

## 2015-09-05 ENCOUNTER — Ambulatory Visit: Payer: Medicare Other | Admitting: Podiatry

## 2015-09-12 ENCOUNTER — Other Ambulatory Visit: Payer: Self-pay | Admitting: Internal Medicine

## 2015-09-29 ENCOUNTER — Other Ambulatory Visit: Payer: Self-pay | Admitting: Internal Medicine

## 2015-09-30 NOTE — Telephone Encounter (Signed)
Please advise, patient is requesting refill on klonopin

## 2015-09-30 NOTE — Telephone Encounter (Signed)
Done hardcopy to Corinne  

## 2015-09-30 NOTE — Telephone Encounter (Signed)
Rx faxed to pharmacy  

## 2015-10-04 ENCOUNTER — Other Ambulatory Visit: Payer: Self-pay | Admitting: Internal Medicine

## 2015-10-20 ENCOUNTER — Inpatient Hospital Stay (HOSPITAL_COMMUNITY): Payer: Medicare Other

## 2015-10-20 ENCOUNTER — Encounter (HOSPITAL_COMMUNITY): Payer: Self-pay | Admitting: General Practice

## 2015-10-20 ENCOUNTER — Inpatient Hospital Stay (HOSPITAL_COMMUNITY)
Admission: AD | Admit: 2015-10-20 | Discharge: 2015-10-28 | DRG: 166 | Disposition: A | Discharge status: Skilled Nursing Facility | Payer: Medicare Other | Source: Ambulatory Visit | Attending: Cardiovascular Disease | Admitting: Cardiovascular Disease

## 2015-10-20 DIAGNOSIS — F329 Major depressive disorder, single episode, unspecified: Secondary | ICD-10-CM | POA: Diagnosis present

## 2015-10-20 DIAGNOSIS — R918 Other nonspecific abnormal finding of lung field: Secondary | ICD-10-CM

## 2015-10-20 DIAGNOSIS — J45901 Unspecified asthma with (acute) exacerbation: Secondary | ICD-10-CM | POA: Diagnosis not present

## 2015-10-20 DIAGNOSIS — K219 Gastro-esophageal reflux disease without esophagitis: Secondary | ICD-10-CM | POA: Diagnosis present

## 2015-10-20 DIAGNOSIS — J440 Chronic obstructive pulmonary disease with acute lower respiratory infection: Secondary | ICD-10-CM | POA: Diagnosis present

## 2015-10-20 DIAGNOSIS — J208 Acute bronchitis due to other specified organisms: Secondary | ICD-10-CM | POA: Diagnosis present

## 2015-10-20 DIAGNOSIS — M542 Cervicalgia: Secondary | ICD-10-CM | POA: Diagnosis present

## 2015-10-20 DIAGNOSIS — R05 Cough: Secondary | ICD-10-CM

## 2015-10-20 DIAGNOSIS — I120 Hypertensive chronic kidney disease with stage 5 chronic kidney disease or end stage renal disease: Secondary | ICD-10-CM | POA: Diagnosis present

## 2015-10-20 DIAGNOSIS — Z85528 Personal history of other malignant neoplasm of kidney: Secondary | ICD-10-CM

## 2015-10-20 DIAGNOSIS — E1151 Type 2 diabetes mellitus with diabetic peripheral angiopathy without gangrene: Secondary | ICD-10-CM | POA: Diagnosis present

## 2015-10-20 DIAGNOSIS — F419 Anxiety disorder, unspecified: Secondary | ICD-10-CM | POA: Diagnosis present

## 2015-10-20 DIAGNOSIS — J209 Acute bronchitis, unspecified: Secondary | ICD-10-CM | POA: Diagnosis present

## 2015-10-20 DIAGNOSIS — C3491 Malignant neoplasm of unspecified part of right bronchus or lung: Secondary | ICD-10-CM | POA: Diagnosis not present

## 2015-10-20 DIAGNOSIS — C3411 Malignant neoplasm of upper lobe, right bronchus or lung: Secondary | ICD-10-CM | POA: Diagnosis present

## 2015-10-20 DIAGNOSIS — G2581 Restless legs syndrome: Secondary | ICD-10-CM | POA: Diagnosis present

## 2015-10-20 DIAGNOSIS — J101 Influenza due to other identified influenza virus with other respiratory manifestations: Secondary | ICD-10-CM | POA: Diagnosis present

## 2015-10-20 DIAGNOSIS — C3490 Malignant neoplasm of unspecified part of unspecified bronchus or lung: Secondary | ICD-10-CM

## 2015-10-20 DIAGNOSIS — J111 Influenza due to unidentified influenza virus with other respiratory manifestations: Secondary | ICD-10-CM | POA: Diagnosis not present

## 2015-10-20 DIAGNOSIS — Z992 Dependence on renal dialysis: Secondary | ICD-10-CM

## 2015-10-20 DIAGNOSIS — E1122 Type 2 diabetes mellitus with diabetic chronic kidney disease: Secondary | ICD-10-CM | POA: Diagnosis present

## 2015-10-20 DIAGNOSIS — E890 Postprocedural hypothyroidism: Secondary | ICD-10-CM | POA: Diagnosis present

## 2015-10-20 DIAGNOSIS — D631 Anemia in chronic kidney disease: Secondary | ICD-10-CM | POA: Diagnosis present

## 2015-10-20 DIAGNOSIS — Z7951 Long term (current) use of inhaled steroids: Secondary | ICD-10-CM

## 2015-10-20 DIAGNOSIS — N2581 Secondary hyperparathyroidism of renal origin: Secondary | ICD-10-CM | POA: Diagnosis present

## 2015-10-20 DIAGNOSIS — Z905 Acquired absence of kidney: Secondary | ICD-10-CM

## 2015-10-20 DIAGNOSIS — F17210 Nicotine dependence, cigarettes, uncomplicated: Secondary | ICD-10-CM | POA: Diagnosis present

## 2015-10-20 DIAGNOSIS — Z8041 Family history of malignant neoplasm of ovary: Secondary | ICD-10-CM

## 2015-10-20 DIAGNOSIS — J441 Chronic obstructive pulmonary disease with (acute) exacerbation: Secondary | ICD-10-CM | POA: Diagnosis present

## 2015-10-20 DIAGNOSIS — G40909 Epilepsy, unspecified, not intractable, without status epilepticus: Secondary | ICD-10-CM | POA: Diagnosis present

## 2015-10-20 DIAGNOSIS — F17200 Nicotine dependence, unspecified, uncomplicated: Secondary | ICD-10-CM | POA: Diagnosis present

## 2015-10-20 DIAGNOSIS — N186 End stage renal disease: Secondary | ICD-10-CM | POA: Diagnosis present

## 2015-10-20 DIAGNOSIS — E039 Hypothyroidism, unspecified: Secondary | ICD-10-CM | POA: Diagnosis present

## 2015-10-20 DIAGNOSIS — R059 Cough, unspecified: Secondary | ICD-10-CM

## 2015-10-20 DIAGNOSIS — G8929 Other chronic pain: Secondary | ICD-10-CM | POA: Diagnosis present

## 2015-10-20 DIAGNOSIS — R0602 Shortness of breath: Secondary | ICD-10-CM | POA: Diagnosis present

## 2015-10-20 HISTORY — DX: Paralysis of vocal cords and larynx, unspecified: J38.00

## 2015-10-20 HISTORY — DX: Unspecified chronic bronchitis: J42

## 2015-10-20 HISTORY — DX: Dependence on renal dialysis: Z99.2

## 2015-10-20 HISTORY — DX: Type 2 diabetes mellitus without complications: E11.9

## 2015-10-20 HISTORY — DX: Cardiac murmur, unspecified: R01.1

## 2015-10-20 HISTORY — DX: Disorder of kidney and ureter, unspecified: N28.9

## 2015-10-20 HISTORY — DX: Other chronic pain: G89.29

## 2015-10-20 HISTORY — DX: Low back pain, unspecified: M54.50

## 2015-10-20 HISTORY — DX: Low back pain: M54.5

## 2015-10-20 HISTORY — DX: End stage renal disease: N18.6

## 2015-10-20 LAB — CBC
HEMATOCRIT: 38.7 % (ref 36.0–46.0)
Hemoglobin: 12.3 g/dL (ref 12.0–15.0)
MCH: 29.6 pg (ref 26.0–34.0)
MCHC: 31.8 g/dL (ref 30.0–36.0)
MCV: 93.3 fL (ref 78.0–100.0)
PLATELETS: 227 10*3/uL (ref 150–400)
RBC: 4.15 MIL/uL (ref 3.87–5.11)
RDW: 16.6 % — AB (ref 11.5–15.5)
WBC: 8.4 10*3/uL (ref 4.0–10.5)

## 2015-10-20 LAB — CBC WITH DIFFERENTIAL/PLATELET
Basophils Absolute: 0 10*3/uL (ref 0.0–0.1)
Basophils Relative: 0 %
EOS PCT: 1 %
Eosinophils Absolute: 0.1 10*3/uL (ref 0.0–0.7)
HCT: 43.2 % (ref 36.0–46.0)
Hemoglobin: 13.5 g/dL (ref 12.0–15.0)
LYMPHS ABS: 1.1 10*3/uL (ref 0.7–4.0)
LYMPHS PCT: 12 %
MCH: 29.8 pg (ref 26.0–34.0)
MCHC: 31.3 g/dL (ref 30.0–36.0)
MCV: 95.4 fL (ref 78.0–100.0)
MONO ABS: 0.6 10*3/uL (ref 0.1–1.0)
MONOS PCT: 6 %
Neutro Abs: 7.9 10*3/uL — ABNORMAL HIGH (ref 1.7–7.7)
Neutrophils Relative %: 81 %
PLATELETS: 220 10*3/uL (ref 150–400)
RBC: 4.53 MIL/uL (ref 3.87–5.11)
RDW: 16.7 % — ABNORMAL HIGH (ref 11.5–15.5)
WBC: 9.7 10*3/uL (ref 4.0–10.5)

## 2015-10-20 LAB — COMPREHENSIVE METABOLIC PANEL
ALT: 27 U/L (ref 14–54)
ANION GAP: 20 — AB (ref 5–15)
AST: 35 U/L (ref 15–41)
Albumin: 3.9 g/dL (ref 3.5–5.0)
Alkaline Phosphatase: 97 U/L (ref 38–126)
BUN: 63 mg/dL — AB (ref 6–20)
CHLORIDE: 97 mmol/L — AB (ref 101–111)
CO2: 19 mmol/L — ABNORMAL LOW (ref 22–32)
CREATININE: 9.47 mg/dL — AB (ref 0.44–1.00)
Calcium: 9.1 mg/dL (ref 8.9–10.3)
GFR, EST AFRICAN AMERICAN: 4 mL/min — AB (ref >60)
GFR, EST NON AFRICAN AMERICAN: 4 mL/min — AB (ref >60)
Glucose, Bld: 92 mg/dL (ref 65–99)
Potassium: >7.5 mmol/L (ref 3.5–5.1)
Sodium: 136 mmol/L (ref 135–145)
Total Bilirubin: 1.8 mg/dL — ABNORMAL HIGH (ref 0.3–1.2)
Total Protein: 8.2 g/dL — ABNORMAL HIGH (ref 6.5–8.1)

## 2015-10-20 LAB — INFLUENZA PANEL BY PCR (TYPE A & B)
H1N1 flu by pcr: NOT DETECTED
INFLAPCR: POSITIVE — AB
Influenza B By PCR: NEGATIVE

## 2015-10-20 LAB — POTASSIUM: Potassium: >7.5 mmol/L (ref 3.5–5.1)

## 2015-10-20 LAB — MRSA PCR SCREENING: MRSA by PCR: NEGATIVE

## 2015-10-20 MED ORDER — LEVOTHYROXINE SODIUM 75 MCG PO TABS
175.0000 ug | ORAL_TABLET | Freq: Every day | ORAL | Status: DC
  Administered 2015-10-21 – 2015-10-28 (×8): 175 ug via ORAL
  Filled 2015-10-20 (×8): qty 1

## 2015-10-20 MED ORDER — FAMOTIDINE 20 MG PO TABS
40.0000 mg | ORAL_TABLET | Freq: Every day | ORAL | Status: DC
  Administered 2015-10-21 – 2015-10-22 (×2): 40 mg via ORAL
  Filled 2015-10-20 (×2): qty 2

## 2015-10-20 MED ORDER — SODIUM CHLORIDE 0.9% FLUSH: 3.0000 mL | INTRAVENOUS | Status: DC | PRN

## 2015-10-20 MED ORDER — SEVELAMER CARBONATE 800 MG PO TABS
3200.0000 mg | ORAL_TABLET | Freq: Three times a day (TID) | ORAL | Status: DC
  Administered 2015-10-21 – 2015-10-28 (×16): 3200 mg via ORAL
  Filled 2015-10-20 (×22): qty 4

## 2015-10-20 MED ORDER — DARBEPOETIN ALFA-POLYSORBATE 25 MCG/0.42ML IJ SOLN: 25.0000 ug | INTRAMUSCULAR | Status: DC

## 2015-10-20 MED ORDER — HYDROCODONE-ACETAMINOPHEN 5-325 MG PO TABS
1.0000 | ORAL_TABLET | Freq: Four times a day (QID) | ORAL | Status: DC | PRN
  Administered 2015-10-21 – 2015-10-26 (×6): 1 via ORAL
  Filled 2015-10-20 (×5): qty 1

## 2015-10-20 MED ORDER — ACETAMINOPHEN 650 MG RE SUPP: 650.0000 mg | Freq: Four times a day (QID) | RECTAL | Status: DC | PRN

## 2015-10-20 MED ORDER — MONTELUKAST SODIUM 10 MG PO TABS
10.0000 mg | ORAL_TABLET | Freq: Every day | ORAL | Status: DC
  Administered 2015-10-20 – 2015-10-27 (×8): 10 mg via ORAL
  Filled 2015-10-20 (×8): qty 1

## 2015-10-20 MED ORDER — ATORVASTATIN CALCIUM 40 MG PO TABS
40.0000 mg | ORAL_TABLET | Freq: Every day | ORAL | Status: DC
  Administered 2015-10-21 – 2015-10-28 (×8): 40 mg via ORAL
  Filled 2015-10-20 (×8): qty 1

## 2015-10-20 MED ORDER — DOXERCALCIFEROL 4 MCG/2ML IV SOLN: 4.0000 ug | INTRAVENOUS | Status: DC

## 2015-10-20 MED ORDER — OSELTAMIVIR PHOSPHATE 30 MG PO CAPS: 30.0000 mg | ORAL_CAPSULE | ORAL | Status: DC

## 2015-10-20 MED ORDER — ALBUTEROL SULFATE (2.5 MG/3ML) 0.083% IN NEBU
3.0000 mL | INHALATION_SOLUTION | Freq: Four times a day (QID) | RESPIRATORY_TRACT | Status: DC | PRN
  Filled 2015-10-20: qty 3

## 2015-10-20 MED ORDER — SODIUM CHLORIDE 0.9% FLUSH
3.0000 mL | Freq: Two times a day (BID) | INTRAVENOUS | Status: DC
  Administered 2015-10-22 – 2015-10-28 (×9): 3 mL via INTRAVENOUS

## 2015-10-20 MED ORDER — OSELTAMIVIR PHOSPHATE 30 MG PO CAPS: 30.0000 mg | ORAL_CAPSULE | Freq: Once | ORAL | Status: DC

## 2015-10-20 MED ORDER — PREGABALIN 50 MG PO CAPS: 50.0000 mg | ORAL_CAPSULE | Freq: Every day | ORAL | Status: DC

## 2015-10-20 MED ORDER — DEXTROSE 5 % IV SOLN
1.0000 g | INTRAVENOUS | Status: DC
  Filled 2015-10-20: qty 10

## 2015-10-20 MED ORDER — ONDANSETRON HCL 4 MG PO TABS
4.0000 mg | ORAL_TABLET | Freq: Four times a day (QID) | ORAL | Status: DC | PRN
  Administered 2015-10-26 – 2015-10-28 (×2): 4 mg via ORAL
  Filled 2015-10-20 (×2): qty 1

## 2015-10-20 MED ORDER — PATIROMER SORBITEX CALCIUM 8.4 G PO PACK
16.8000 g | PACK | Freq: Every day | ORAL | Status: DC
  Filled 2015-10-20: qty 4

## 2015-10-20 MED ORDER — COLCHICINE 0.6 MG PO TABS
0.6000 mg | ORAL_TABLET | Freq: Every day | ORAL | Status: DC
  Administered 2015-10-21 – 2015-10-22 (×2): 0.6 mg via ORAL
  Filled 2015-10-20 (×2): qty 1

## 2015-10-20 MED ORDER — HYDROXYZINE HCL 25 MG PO TABS: 25.0000 mg | ORAL_TABLET | Freq: Three times a day (TID) | ORAL | Status: DC | PRN

## 2015-10-20 MED ORDER — DULOXETINE HCL 60 MG PO CPEP
60.0000 mg | ORAL_CAPSULE | Freq: Every day | ORAL | Status: DC
  Administered 2015-10-21 – 2015-10-28 (×8): 60 mg via ORAL
  Filled 2015-10-20 (×8): qty 1

## 2015-10-20 MED ORDER — ALBUTEROL SULFATE (2.5 MG/3ML) 0.083% IN NEBU
2.5000 mg | INHALATION_SOLUTION | Freq: Two times a day (BID) | RESPIRATORY_TRACT | Status: DC
  Administered 2015-10-21 – 2015-10-27 (×12): 2.5 mg via RESPIRATORY_TRACT
  Filled 2015-10-20 (×15): qty 3

## 2015-10-20 MED ORDER — PREGABALIN 50 MG PO CAPS
50.0000 mg | ORAL_CAPSULE | Freq: Every day | ORAL | Status: DC
  Administered 2015-10-21 – 2015-10-28 (×8): 50 mg via ORAL
  Filled 2015-10-20 (×9): qty 1

## 2015-10-20 MED ORDER — CLONAZEPAM 1 MG PO TABS
1.0000 mg | ORAL_TABLET | Freq: Two times a day (BID) | ORAL | Status: DC
  Administered 2015-10-20 – 2015-10-28 (×16): 1 mg via ORAL
  Filled 2015-10-20 (×16): qty 1

## 2015-10-20 MED ORDER — OSELTAMIVIR PHOSPHATE 30 MG PO CAPS
30.0000 mg | ORAL_CAPSULE | ORAL | Status: AC
  Administered 2015-10-21: 30 mg via ORAL
  Filled 2015-10-20: qty 1

## 2015-10-20 MED ORDER — ALLOPURINOL 100 MG PO TABS
100.0000 mg | ORAL_TABLET | Freq: Every day | ORAL | Status: DC
  Administered 2015-10-21 – 2015-10-28 (×8): 100 mg via ORAL
  Filled 2015-10-20 (×8): qty 1

## 2015-10-20 MED ORDER — QUETIAPINE FUMARATE 50 MG PO TABS
100.0000 mg | ORAL_TABLET | Freq: Every day | ORAL | Status: DC
  Administered 2015-10-20 – 2015-10-27 (×8): 100 mg via ORAL
  Filled 2015-10-20 (×8): qty 2

## 2015-10-20 MED ORDER — RENA-VITE PO TABS
1.0000 | ORAL_TABLET | Freq: Every day | ORAL | Status: DC
  Administered 2015-10-22 – 2015-10-27 (×6): 1 via ORAL
  Filled 2015-10-20 (×7): qty 1

## 2015-10-20 MED ORDER — ACETAMINOPHEN 325 MG PO TABS
650.0000 mg | ORAL_TABLET | Freq: Four times a day (QID) | ORAL | Status: DC | PRN
  Administered 2015-10-20 – 2015-10-22 (×3): 650 mg via ORAL
  Filled 2015-10-20 (×2): qty 2

## 2015-10-20 MED ORDER — HEPARIN SODIUM (PORCINE) 5000 UNIT/ML IJ SOLN
5000.0000 [IU] | Freq: Three times a day (TID) | INTRAMUSCULAR | Status: DC
  Administered 2015-10-20 – 2015-10-28 (×22): 5000 [IU] via SUBCUTANEOUS
  Filled 2015-10-20 (×19): qty 1

## 2015-10-20 MED ORDER — BENZONATATE 100 MG PO CAPS
100.0000 mg | ORAL_CAPSULE | Freq: Four times a day (QID) | ORAL | Status: DC | PRN
  Administered 2015-10-21: 100 mg via ORAL
  Administered 2015-10-23 – 2015-10-24 (×2): 200 mg via ORAL
  Filled 2015-10-20: qty 1
  Filled 2015-10-20 (×2): qty 2

## 2015-10-20 MED ORDER — DOCUSATE SODIUM 100 MG PO CAPS
100.0000 mg | ORAL_CAPSULE | Freq: Two times a day (BID) | ORAL | Status: DC
  Administered 2015-10-20 – 2015-10-28 (×16): 100 mg via ORAL
  Filled 2015-10-20 (×16): qty 1

## 2015-10-20 MED ORDER — CEFTRIAXONE SODIUM 1 G IJ SOLR: 1.0000 g | INTRAMUSCULAR | Status: DC

## 2015-10-20 MED ORDER — ALBUTEROL SULFATE (2.5 MG/3ML) 0.083% IN NEBU
2.5000 mg | INHALATION_SOLUTION | Freq: Four times a day (QID) | RESPIRATORY_TRACT | Status: DC
  Administered 2015-10-20: 2.5 mg via RESPIRATORY_TRACT
  Filled 2015-10-20: qty 3

## 2015-10-20 MED ORDER — SODIUM CHLORIDE 0.9 % IV SOLN: 250.0000 mL | INTRAVENOUS | Status: DC | PRN

## 2015-10-20 NOTE — Progress Notes (Signed)
Was called about patient being inhouse- supposedly had HD on Tuesday but missed Thursday.  Initial K was >7.5 but also slight hemolysis.  Recheck is the same still hemolysis-not entirely sure these results are correct,  HR is OK but to be on the safe side will do HD overnight and not wait til the AM- also give dose of veltassa   [name] [name] A

## 2015-10-20 NOTE — Progress Notes (Signed)
Admission note:  Arrival Method: Wheelchair from clinic Mental Orientation:A&OX4 Telemetry: [place] notified Assessment: See Flowsheet Skin: Dry; Flaky IV: N/A Pain: Denies Tubes: N/A Safety Measures: Bed alarm activated, call light within reach, nonskid socks in place.  Fall Prevention Safety Plan: Reviewed with pt Admission Screening: To be completed 6700 Orientation: Patient has been oriented to the unit, staff and to the room.  Awaiting orders. Will continue to assess and monitor pt.

## 2015-10-20 NOTE — Progress Notes (Signed)
Dr. Doylene Canard made aware of pt's critical potassium.

## 2015-10-20 NOTE — Progress Notes (Signed)
CRITICAL VALUE ALERT  Critical value received:  Potassium over 7.5  Date of notification:  10/20/2015  Time of notification:  10:38 pm  Critical value read back:Yes.    Nurse who received alert:  Arlyss Queen  MD notified (1st page):  Dr. Doylene Canard  Time of first page:  11:23 pm  Responding MD:  Dr. Doylene Canard  Time MD responded:  11:24 pm

## 2015-10-20 NOTE — Progress Notes (Signed)
IV team RN called this RN and stated that 3 RNs were unable to start a peripheral IV.  Patient also tested positive for flu.  Notified Dr. Doylene Canard regarding this information.  Verbal orders received to start tamiflu 75 mg PO twice daily, to discontinue IV antibiotic order, and to place order for PICC line.  Will continue to monitor.

## 2015-10-20 NOTE — Progress Notes (Signed)
Verbal orders received from Dr. Doylene Canard to start patient's home dose of lyrica 50 mg PO once daily.  Patient also requested to restart her home dose of sensipar.  Dr. Doylene Canard stated that the nephrologist will handle the latter medication.  Dr. Doylene Canard also made aware of patient's high potassium level and for plan for hemodialysis tonight.  No further orders.  Will continue to monitor.

## 2015-10-20 NOTE — H&P (Signed)
Referring Physician:  [name] is an 75 y.o. female.                       Chief Complaint: Cough and shortness of breath.  HPI: 75 year old female with past history of hypertension, DM, II, Dyslipidemia, Gout, anemia, tobacco use disorder, Anxiety and depression, ESRD on hemodialysis, PVD with left and right 2nd toe amputations, back pain and hip pain, has fever, cough and shortness of breath progressively worsening over 1 week. Chest X-ray suggestive of right upper lung mass.  Past Medical History  Diagnosis Date  . HYPOTHYROIDISM 02/17/2007    s/p surgical removal of goiter in 1997  . DIABETES MELLITUS, TYPE II 02/17/2007  . HYPERLIPIDEMIA 02/17/2007  . GOUT 05/29/2007  . ANEMIA-NOS 05/29/2007  . ANXIETY 03/23/2010  . 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 DEPRESSION 02/17/2007  . RESTLESS LEG SYNDROME 05/29/2007  . COMMON MIGRAINE 05/29/2007  . PERIPHERAL NEUROPATHY 05/29/2007  . HYPERTENSION 02/17/2007  . PERIPHERAL VASCULAR DISEASE 02/17/2007  . PEPTIC ULCER DISEASE 05/29/2007  . ESRD on hemodialysis ([place]) 02/17/2007    Started dialysis April 2014.  Gets HD at [facility] on TTS schedule.  Cause of ESRD was HTN.     . OSTEOPENIA 09/22/2009  . SEIZURE DISORDER 02/17/2007  . Memory loss 01/24/2010  . Personal history of colonic polyps 11/16/2009  . Palpitations 09/08/2010  . Other diseases of vocal cords 1996  . HEART MURMUR, HX OF 10/02/2010  . 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 Chronic neck pain 12/13/2010  . GERD (gastroesophageal reflux disease)   . 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 Cancer of kidney ([place]) 10/07/2012    Followed per Dr Despina Pole, MD, urology, [place]   . Arthritis   . Critical lower limb ischemia       Past Surgical History  Procedure Laterality Date  . Left toe amputated  2006  . Bunionectomy  1980  . Goiter removal  1997  . Stress cardiolite  06/18/2006  . Tranthoracic echocardiogram  06/18/2006  .  Electrocardiogram  05/29/2006  . Cholecystectomy    . Rotator cuff repair  Left    Dr. Sharol Given  . Av fistula placement  03/13/2012    Procedure: ARTERIOVENOUS (AV) FISTULA CREATION;  Surgeon: Conrad Cylinder, MD;  Location: [place];  Service: Vascular;  Laterality: Right;  . Eye surgery      bilateral cataract removal  . Esophagoscopy w/ botox injection  07/22/2012    Procedure: ESOPHAGOSCOPY WITH BOTOX INJECTION;  Surgeon: Rozetta Nunnery, MD;  Location: [place];  Service: ENT;  Laterality: N/A;  esophageal dilation  . Insertion of dialysis catheter N/A 02/05/2013    Procedure: INSERTION OF DIALYSIS CATHETER;  Surgeon: Angelia Mould, MD;  Location: [place];  Service: Vascular;  Laterality: N/A;  Ultrasound guided  . Toe amputation Right Aug. 2015    2nd     Family History  Problem Relation Age of Onset  . Dementia Mother   . Hypertension Mother   . Coronary artery disease Other   . Hyperlipidemia Other   . Hypertension Other   . Ovarian cancer Other   . Stroke Other   . Hypertension Sister   . Hypertension Brother   . Heart attack Brother   . Stroke Brother    Social History:  reports that she has been smoking Cigarettes.  She has a  11.75 pack-year smoking history. She has never used smokeless tobacco. She reports that she does not drink alcohol or use illicit drugs.  Allergies:  Allergies  Allergen Reactions  . Nsaids     REACTION: renal dysfunction  . Vancomycin     See comment under cephalosporin    Medications Prior to Admission  Medication Sig Dispense Refill  . acetaminophen (TYLENOL) 325 MG tablet Take 2 tablets (650 mg total) by mouth every 6 (six) hours as needed for mild pain (or Fever >/= 101).    Marland Kitchen albuterol (PROVENTIL HFA;VENTOLIN HFA) 108 (90 BASE) MCG/ACT inhaler Inhale 2 puffs into the lungs every 6 (six) hours as needed for wheezing or shortness of breath. 1 Inhaler 5  . allopurinol (ZYLOPRIM) 100 MG tablet Take 100 mg by mouth daily.      Marland Kitchen allopurinol (ZYLOPRIM) 100 MG tablet TAKE ONE TABLET BY MOUTH ONCE DAILY 90 tablet 0  . atorvastatin (LIPITOR) 40 MG tablet TAKE ONE TABLET BY MOUTH ONCE DAILY 30 tablet 5  . azithromycin (ZITHROMAX Z-PAK) 250 MG tablet Use as directed 6 tablet 1  . benzonatate (TESSALON) 100 MG capsule Take 100-200 mg by mouth every 6 (six) hours as needed for cough.    . clonazePAM (KLONOPIN) 1 MG tablet Take 1 tablet (1 mg total) by mouth 2 (two) times daily. As needed    . clonazePAM (KLONOPIN) 2 MG tablet TAKE ONE TABLET BY MOUTH TWICE DAILY AS NEEDED 60 tablet 2  . colchicine 0.6 MG tablet Take 0.6 mg by mouth daily.     . cyclobenzaprine (FLEXERIL) 10 MG tablet TAKE ONE TABLET BY MOUTH THREE TIMES DAILY AS NEEDED FOR MUSCLE SPASM 30 tablet 0  . darbepoetin (ARANESP) 25 MCG/0.42ML SOLN injection Inject 25 mcg into the vein every 7 (seven) days. Every Thursday at dialysis    . docusate sodium (COLACE) 100 MG capsule Take 100 mg by mouth 2 (two) times daily.     Marland Kitchen doxercalciferol (HECTOROL) 4 MCG/2ML injection Inject 4 mcg into the vein Every Tuesday,Thursday,and Saturday with dialysis.    . DULoxetine (CYMBALTA) 30 MG capsule Take 2 capsules (60 mg total) by mouth daily. 60 capsule 11  . famotidine (PEPCID) 40 MG tablet Take 1 tablet (40 mg total) by mouth 2 (two) times daily. (Patient taking differently: Take 40 mg by mouth daily. ) 60 tablet 11  . HYDROcodone-acetaminophen (NORCO/VICODIN) 5-325 MG per tablet Take 1 tablet by mouth every 6 (six) hours as needed for severe pain. 30 tablet 0  . hydrOXYzine (ATARAX/VISTARIL) 10 MG tablet Take 2.5 tablets (25 mg total) by mouth 3 (three) times daily as needed for anxiety. 30 tablet 0  . hydrOXYzine (ATARAX/VISTARIL) 25 MG tablet TAKE ONE TABLET BY MOUTH EVERY 6 HOURS AS NEEDED FOR ANXIETY. 30 tablet 0  . lactulose (CHRONULAC) 10 GM/15ML solution Take 30 mLs (20 g total) by mouth every other day. 473 mL 3  . levothyroxine (SYNTHROID, LEVOTHROID) 175 MCG tablet  Take 1 tablet (175 mcg total) by mouth daily before breakfast. 90 tablet 3  . mometasone-formoterol (DULERA) 100-5 MCG/ACT AERO INHALE TWO PUFFS INTO LUNGS TWICE DAILY 1 Inhaler 5  . montelukast (SINGULAIR) 10 MG tablet TAKE ONE TABLET BY MOUTH AT BEDTIME 30 tablet 5  . multivitamin (RENA-VIT) TABS tablet Take 1 tablet by mouth daily.    . mupirocin ointment (BACTROBAN) 2 % Place 1 application into the nose 2 (two) times daily. 22 g 0  . omeprazole (PRILOSEC) 40 MG capsule TAKE ONE  CAPSULE BY MOUTH ONCE DAILY (PATIENT  NEEDS  OFFICE  VISIT  BEFORE  ANY  FURTHER  REFILLS) 30 capsule 0  . ondansetron (ZOFRAN) 4 MG tablet Take 4 mg by mouth every 6 (six) hours as needed for nausea or vomiting.    . pregabalin (LYRICA) 50 MG capsule Take 50 mg by mouth daily.    . QUEtiapine (SEROQUEL) 100 MG tablet TAKE ONE TABLET BY MOUTH AT BEDTIME 90 tablet 0  . sevelamer carbonate (RENVELA) 800 MG tablet Take 3,200 mg by mouth 3 (three) times daily with meals.       Results for orders placed or performed during the hospital encounter of 10/20/15 (from the past 48 hour(s))  CBC WITH DIFFERENTIAL     Status: Abnormal   Collection Time: 10/20/15  4:38 PM  Result Value Ref Range   WBC 9.7 4.0 - 10.5 K/uL   RBC 4.53 3.87 - 5.11 MIL/uL   Hemoglobin 13.5 12.0 - 15.0 g/dL   HCT 43.2 36.0 - 46.0 %   MCV 95.4 78.0 - 100.0 fL   MCH 29.8 26.0 - 34.0 pg   MCHC 31.3 30.0 - 36.0 g/dL   RDW 16.7 (H) 11.5 - 15.5 %   Platelets 220 150 - 400 K/uL   Neutrophils Relative % 81 %   Neutro Abs 7.9 (H) 1.7 - 7.7 K/uL   Lymphocytes Relative 12 %   Lymphs Abs 1.1 0.7 - 4.0 K/uL   Monocytes Relative 6 %   Monocytes Absolute 0.6 0.1 - 1.0 K/uL   Eosinophils Relative 1 %   Eosinophils Absolute 0.1 0.0 - 0.7 K/uL   Basophils Relative 0 %   Basophils Absolute 0.0 0.0 - 0.1 K/uL  Comprehensive metabolic panel     Status: Abnormal   Collection Time: 10/20/15  4:38 PM  Result Value Ref Range   Sodium 136 135 - 145 mmol/L    Potassium >7.5 (HH) 3.5 - 5.1 mmol/L    Comment: SLIGHT HEMOLYSIS REPEATED TO VERIFY CRITICAL RESULT CALLED TO, READ BACK BY AND VERIFIED WITH: K KELLY,RN 1812 10/20/2015 WBOND    Chloride 97 (L) 101 - 111 mmol/L   CO2 19 (L) 22 - 32 mmol/L   Glucose, Bld 92 65 - 99 mg/dL   BUN 63 (H) 6 - 20 mg/dL   Creatinine, Ser 9.47 (H) 0.44 - 1.00 mg/dL   Calcium 9.1 8.9 - 10.3 mg/dL   Total Protein 8.2 (H) 6.5 - 8.1 g/dL   Albumin 3.9 3.5 - 5.0 g/dL   AST 35 15 - 41 U/L   ALT 27 14 - 54 U/L   Alkaline Phosphatase 97 38 - 126 U/L   Total Bilirubin 1.8 (H) 0.3 - 1.2 mg/dL   GFR calc non Af Amer 4 (L) >60 mL/min   GFR calc Af Amer 4 (L) >60 mL/min    Comment: (NOTE) The eGFR has been calculated using the CKD EPI equation. This calculation has not been validated in all clinical situations. eGFR's persistently <60 mL/min signify possible Chronic Kidney Disease.    Anion gap 20 (H) 5 - 15   X-ray Chest Pa And Lateral  10/20/2015  CLINICAL DATA:  Cough progressively getting worse. EXAM: CHEST  2 VIEW COMPARISON:  08/03/2014 FINDINGS: Lungs are hypoinflated with a 3.1 cm masslike opacity over the right upper lobe. Mild prominence of the perihilar markings. No evidence of effusion. Cardiomediastinal silhouette is within normal. There is calcified plaque over the aortic arch. There are degenerative changes of  the spine with curvature of the thoracic spine convex right unchanged. Degenerative change of the left shoulder. Surgical clips are present over the neck base. IMPRESSION: No acute cardiopulmonary disease per 3.1 cm masslike opacity over the right upper lobe. Recommend chest CT for further evaluation to exclude malignancy. Electronically Signed   By: Marin Olp M.D.   On: 10/20/2015 17:50    Review Of Systems The patient denies chronic fever, vision loss, decreased hearing. No hoarseness, chest pain, peripheral edema, hemoptysis. No abdominal pain, melena, hematochezia, severe  indigestion/heartburn. No hematuria, incontinence, genital sores, suspicious skin lesions.  No transient blindness, unusual weight change, abnormal bleeding, enlarged lymph nodes, angioedema, and breast masses.  She has anorexia, weight loss.  She has syncope, depression. She has arhtritis, ESRD and DM, II.  Blood pressure 137/58, pulse 100, temperature 101.7 F (38.7 C), temperature source Oral, resp. rate 17, SpO2 100 %.  HEENT: Normocephalic. Mucous membranes pale pink; PERRLA; EOM intact; No scleral icterus, Nares: Patent, Oropharynx: Clear, Edentulous. Neck: 50 % ROM, no cervical lymphadenopathy nor thyromegaly or carotid bruit; no JVD; CHEST WALL: No tenderness. CHEST: Mild respiratory distress at rest. Decreased air entry and expiratory wheezes on auscultation bilaterally. HEART: Regular rate and rhythm; no rubs or gallops. II/VI systolic murmur. BACK: Mild kyphosis. No scoliosis; no CVA tenderness ABDOMEN: Positive Bowel Sounds, Scaphoid, Obese, soft non-tender; no masses, no organomegaly, no pannus; no intertriginous candida. EXTREMITIES: No bone or joint deformity; age-appropriate arthropathy of the hands and knees; no cyanosis, +ve clubbing. No edema; no ulcerations.   PULSES: 2+ and symmetric SKIN: Normal hydration no rash or ulceration CNS: Cranial nerves 2-12 grossly intact no focal neurologic deficit.  Assessment/Plan Acute asthmatic bronchitis Hypertension DM, II Dyslipidemia Gout Anemia of chronic disease Tobacco use disorder Anxiety and depression ESRD on hemodialysis PVD with left and right 2nd toe amputations  Plan: Admit/R/O influenza/Albuterol breathing treatments/Renal consult for dialysis. CT chest to r/o malignancy.  Birdie Riddle, MD  10/20/2015, 6:55 PM

## 2015-10-21 LAB — RENAL FUNCTION PANEL
Albumin: 3.1 g/dL — ABNORMAL LOW (ref 3.5–5.0)
Anion gap: 16 — ABNORMAL HIGH (ref 5–15)
BUN: 31 mg/dL — AB (ref 6–20)
CHLORIDE: 94 mmol/L — AB (ref 101–111)
CO2: 25 mmol/L (ref 22–32)
CREATININE: 5.16 mg/dL — AB (ref 0.44–1.00)
Calcium: 8.4 mg/dL — ABNORMAL LOW (ref 8.9–10.3)
GFR calc Af Amer: 9 mL/min — ABNORMAL LOW (ref >60)
GFR, EST NON AFRICAN AMERICAN: 7 mL/min — AB (ref >60)
Glucose, Bld: 96 mg/dL (ref 65–99)
Phosphorus: 3.2 mg/dL (ref 2.5–4.6)
Potassium: 3.7 mmol/L (ref 3.5–5.1)
Sodium: 135 mmol/L (ref 135–145)

## 2015-10-21 LAB — BASIC METABOLIC PANEL
Anion gap: 16 — ABNORMAL HIGH (ref 5–15)
BUN: 18 mg/dL (ref 6–20)
CO2: 27 mmol/L (ref 22–32)
Calcium: 9.1 mg/dL (ref 8.9–10.3)
Chloride: 94 mmol/L — ABNORMAL LOW (ref 101–111)
Creatinine, Ser: 4.97 mg/dL — ABNORMAL HIGH (ref 0.44–1.00)
GFR calc Af Amer: 9 mL/min — ABNORMAL LOW (ref >60)
GFR calc non Af Amer: 8 mL/min — ABNORMAL LOW (ref >60)
Glucose, Bld: 81 mg/dL (ref 65–99)
Potassium: 5.4 mmol/L — ABNORMAL HIGH (ref 3.5–5.1)
Sodium: 137 mmol/L (ref 135–145)

## 2015-10-21 MED ORDER — ALTEPLASE 2 MG IJ SOLR: 2.0000 mg | Freq: Once | INTRAMUSCULAR | Status: DC | PRN

## 2015-10-21 MED ORDER — ACETAMINOPHEN 325 MG PO TABS
ORAL_TABLET | ORAL | Status: AC
  Administered 2015-10-21: 650 mg via ORAL
  Filled 2015-10-21: qty 2

## 2015-10-21 MED ORDER — OSELTAMIVIR PHOSPHATE 30 MG PO CAPS
30.0000 mg | ORAL_CAPSULE | Freq: Once | ORAL | Status: AC
  Administered 2015-10-21: 30 mg via ORAL
  Filled 2015-10-21: qty 1

## 2015-10-21 MED ORDER — LIDOCAINE-PRILOCAINE 2.5-2.5 % EX CREA: 1.0000 "application " | TOPICAL_CREAM | CUTANEOUS | Status: DC | PRN

## 2015-10-21 MED ORDER — LIDOCAINE HCL (PF) 1 % IJ SOLN: 5.0000 mL | INTRAMUSCULAR | Status: DC | PRN

## 2015-10-21 MED ORDER — PENTAFLUOROPROP-TETRAFLUOROETH EX AERO: 1.0000 "application " | INHALATION_SPRAY | CUTANEOUS | Status: DC | PRN

## 2015-10-21 MED ORDER — HEPARIN SODIUM (PORCINE) 1000 UNIT/ML DIALYSIS: 1000.0000 [IU] | INTRAMUSCULAR | Status: DC | PRN

## 2015-10-21 MED ORDER — AMLODIPINE BESYLATE 5 MG PO TABS
5.0000 mg | ORAL_TABLET | Freq: Every day | ORAL | Status: DC
  Administered 2015-10-21 – 2015-10-26 (×6): 5 mg via ORAL
  Filled 2015-10-21 (×7): qty 1

## 2015-10-21 MED ORDER — HYDROCODONE-ACETAMINOPHEN 5-325 MG PO TABS
ORAL_TABLET | ORAL | Status: AC
  Filled 2015-10-21: qty 1

## 2015-10-21 MED ORDER — OSELTAMIVIR PHOSPHATE 30 MG PO CAPS
30.0000 mg | ORAL_CAPSULE | ORAL | Status: AC
  Administered 2015-10-22: 30 mg via ORAL
  Filled 2015-10-21: qty 1

## 2015-10-21 MED ORDER — ALBUMIN HUMAN 25 % IV SOLN
INTRAVENOUS | Status: AC
  Administered 2015-10-21: 25 g via INTRAVENOUS
  Filled 2015-10-21: qty 100

## 2015-10-21 MED ORDER — SODIUM CHLORIDE 0.9 % IV SOLN: 100.0000 mL | INTRAVENOUS | Status: DC | PRN

## 2015-10-21 MED ORDER — DOXERCALCIFEROL 4 MCG/2ML IV SOLN
2.0000 ug | INTRAVENOUS | Status: DC
  Administered 2015-10-22 – 2015-10-27 (×3): 2 ug via INTRAVENOUS
  Filled 2015-10-21 (×3): qty 2

## 2015-10-21 MED ORDER — HEPARIN SODIUM (PORCINE) 1000 UNIT/ML DIALYSIS: 20.0000 [IU]/kg | INTRAMUSCULAR | Status: DC | PRN

## 2015-10-21 MED ORDER — SODIUM CHLORIDE 0.9 % IV SOLN
100.0000 mL | INTRAVENOUS | Status: DC | PRN
Start: 2015-10-21 — End: 2015-10-21

## 2015-10-21 MED ORDER — ALBUMIN HUMAN 25 % IV SOLN
25.0000 g | Freq: Once | INTRAVENOUS | Status: AC
  Administered 2015-10-21: 25 g via INTRAVENOUS

## 2015-10-21 NOTE — Progress Notes (Signed)
Ref: Cathlean Cower, MD   Subjective:  Feeling exhausted. T max 101.7. Influenza A was positive by PCR. Hyperkalemia corrected by ultrafiltration/hemodialysis.  Objective:  Vital Signs in the last 24 hours: Temp:  [98 F (36.7 C)-101.7 F (38.7 C)] 99 F (37.2 C) (03/17 1300) Pulse Rate:  [94-120] 113 (03/17 1300) Cardiac Rhythm:  [-] Sinus tachycardia (03/17 0700) Resp:  [17-22] 20 (03/17 1300) BP: (58-137)/(30-68) 104/68 mmHg (03/17 1300) SpO2:  [96 %-100 %] 98 % (03/17 1300) Weight:  [70 kg (154 lb 5.2 oz)-72.7 kg (160 lb 4.4 oz)] 70 kg (154 lb 5.2 oz) (03/17 0545)  Physical Exam: BP Readings from Last 1 Encounters:  10/21/15 104/68    Wt Readings from Last 1 Encounters:  10/21/15 70 kg (154 lb 5.2 oz)    Weight change:   HEENT: [place]/AT, Eyes-Brown, PERL, EOMI, Conjunctiva-Pink, Sclera-Non-icteric Neck: No JVD, No bruit, Trachea midline. Lungs:  Coarse, Bilateral. Cardiac:  Regular rhythm, normal S1 and S2, no S3. II/VI systolic murmur. Abdomen:  Soft, non-tender. Extremities:  No edema present. No cyanosis. No clubbing. Right upper arm AVF.  CNS: AxOx3, Cranial nerves grossly intact, moves all 4 extremities. Right handed. Skin: Warm and dry.   Intake/Output from previous day: 03/16 0701 - 03/17 0700 In: 480 [P.O.:480] Out: 500     Lab Results: BMET    Component Value Date/Time   NA 137 10/21/2015 1117   NA 135 10/21/2015 0209   NA 136 10/20/2015 1638   K 5.4* 10/21/2015 1117   K 3.7 10/21/2015 0209   K >7.5* 10/20/2015 2200   CL 94* 10/21/2015 1117   CL 94* 10/21/2015 0209   CL 97* 10/20/2015 1638   CO2 27 10/21/2015 1117   CO2 25 10/21/2015 0209   CO2 19* 10/20/2015 1638   GLUCOSE 81 10/21/2015 1117   GLUCOSE 96 10/21/2015 0209   GLUCOSE 92 10/20/2015 1638   BUN 18 10/21/2015 1117   BUN 31* 10/21/2015 0209   BUN 63* 10/20/2015 1638   CREATININE 4.97* 10/21/2015 1117   CREATININE 5.16* 10/21/2015 0209   CREATININE 9.47* 10/20/2015 1638   CALCIUM 9.1  10/21/2015 1117   CALCIUM 8.4* 10/21/2015 0209   CALCIUM 9.1 10/20/2015 1638   GFRNONAA 8* 10/21/2015 1117   GFRNONAA 7* 10/21/2015 0209   GFRNONAA 4* 10/20/2015 1638   GFRAA 9* 10/21/2015 1117   GFRAA 9* 10/21/2015 0209   GFRAA 4* 10/20/2015 1638   CBC    Component Value Date/Time   WBC 8.4 10/20/2015 2200   RBC 4.15 10/20/2015 2200   RBC 3.08* 03/09/2012 0500   HGB 12.3 10/20/2015 2200   HCT 38.7 10/20/2015 2200   PLT 227 10/20/2015 2200   MCV 93.3 10/20/2015 2200   MCH 29.6 10/20/2015 2200   MCHC 31.8 10/20/2015 2200   RDW 16.6* 10/20/2015 2200   LYMPHSABS 1.1 10/20/2015 1638   MONOABS 0.6 10/20/2015 1638   EOSABS 0.1 10/20/2015 1638   BASOSABS 0.0 10/20/2015 1638   HEPATIC Function Panel  Recent Labs  05/25/15 1515 10/20/15 1638  PROT 7.9 8.2*   HEMOGLOBIN A1C No components found for: HGA1C,  MPG CARDIAC ENZYMES Lab Results  Component Value Date   CKTOTAL 84 03/08/2012   TROPONINI <0.03 08/03/2014   BNP No results for input(s): PROBNP in the last 8760 hours. TSH  Recent Labs  03/01/15 1430 05/26/15 1040  TSH 7.33* 1.166   CHOLESTEROL  Recent Labs  03/01/15 1430  CHOL 139    Scheduled Meds: . albuterol  2.5 mg Nebulization BID  . allopurinol  100 mg Oral Daily  . amLODipine  5 mg Oral QHS  . atorvastatin  40 mg Oral Daily  . clonazePAM  1 mg Oral BID  . colchicine  0.6 mg Oral Daily  . docusate sodium  100 mg Oral BID  . [START ON 10/22/2015] doxercalciferol  2 mcg Intravenous Q T,Th,Sa-HD  . DULoxetine  60 mg Oral Daily  . famotidine  40 mg Oral Daily  . heparin  5,000 Units Subcutaneous 3 times per day  . HYDROcodone-acetaminophen      . levothyroxine  175 mcg Oral QAC breakfast  . montelukast  10 mg Oral QHS  . multivitamin  1 tablet Oral QHS  . [START ON 10/22/2015] oseltamivir  30 mg Oral Q T,Th,Sa-HD  . pregabalin  50 mg Oral Daily  . QUEtiapine  100 mg Oral QHS  . sevelamer carbonate  3,200 mg Oral TID WC  . sodium chloride  flush  3 mL Intravenous Q12H   Continuous Infusions:  PRN Meds:.sodium chloride, acetaminophen **OR** acetaminophen, albuterol, benzonatate, HYDROcodone-acetaminophen, hydrOXYzine, ondansetron, sodium chloride flush  Assessment/Plan: Acute asthmatic bronchitis with influenza A Hypertension DM, II Dyslipidemia Gout Anemia of chronic disease Tobacco use disorder Anxiety and depression ESRD on hemodialysis PVD with left and right 2nd toe amputations Hyperkalemia-corrected  Continue current treatment. Appreciate nephrology consult and treatment.     LOS: 1 day    Dixie Dials  MD  10/21/2015, 2:42 PM

## 2015-10-21 NOTE — Progress Notes (Addendum)
Pt had HD that ended this am.  Repeat BMP at 10 am to make sure equilibrated K is ok.  Plan next HD Saturday to keep on shcdule. Net UF 500.

## 2015-10-21 NOTE — Progress Notes (Signed)
Received order for PICC placement. Low CRCL and ESRD. Arlyss Queen RN notified to contact Renal MD for order to approve PICC placement.

## 2015-10-21 NOTE — Progress Notes (Signed)
Spoke with Dr. Power regarding PICC order.  Dialysis  Pt.  Does not want PICC and will consider other type of line.

## 2015-10-21 NOTE — Consult Note (Signed)
Reason for Consult: To manage dialysis and dialysis related needs Referring Physician: Payton Emerald Ra is an 75 y.o. female with DM, HTN, hyperlipidemia, gout, tobacco use, PVD and also ESRD- TTS at [place].  She was admitted today as a direct admit by Dr. Doylene Canard for fever, cough, SOB- CXR shows "mass like opacity" Her initial labs showed a potassium of >7.5 with slight hemolysis- repeat was the same.  She did go to HD on 3/14 and stayed her full time.  Decision was made to do semi emergent HD overnight- K after 30 min of zero K was 3.7.  Also of note, flu screen was positive.  Patient very sleepy on HD    Dialyzes at [place] 68.5- . HD Bath 2/2.5 calc, Dialyzer 180, Heparin yes. Access AVF. Profile #4  Hectorol 2, no ESA- last hgb over 12, last calc 8.4, PTH 257 and phos 6.0  Past Medical History  Diagnosis Date  . HYPOTHYROIDISM 02/17/2007    s/p surgical removal of goiter in 1997  . HYPERLIPIDEMIA 02/17/2007  . GOUT 05/29/2007  . ANEMIA-NOS 05/29/2007  . ANXIETY 03/23/2010  . 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 DEPRESSION 02/17/2007  . RESTLESS LEG SYNDROME 05/29/2007  . PERIPHERAL NEUROPATHY 05/29/2007  . HYPERTENSION 02/17/2007  . PERIPHERAL VASCULAR DISEASE 02/17/2007  . PEPTIC ULCER DISEASE 05/29/2007    "when I was in college"  . OSTEOPENIA 09/22/2009  . SEIZURE DISORDER 02/17/2007  . Memory loss 01/24/2010  . Personal history of colonic polyps 11/16/2009  . Palpitations 09/08/2010  . Vocal cord paralysis 1996  . 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 Chronic neck pain 12/13/2010  . GERD (gastroesophageal reflux disease)   . Critical lower limb ischemia   . ESRD on hemodialysis ([place]) 02/17/2007    Started dialysis April 2014.  Gets HD at [facility] on TTS schedule.  Cause of ESRD was HTN.     . Cancer of kidney ([place]) 10/07/2012    Followed per Dr Despina Pole, MD, urology, [place]   . ESRD (end stage renal disease) on dialysis ([place])     "TTS. [address]" (10/20/2015)  .  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 Heart murmur 10/02/2010    hx  . Chronic bronchitis ([place])   . Type II diabetes mellitus ([place]) 02/17/2007    "haven't had it since ~ 2010" (10/20/2015)  . COMMON MIGRAINE     "stress common migraines"  . Arthritis     "all over my body"  . Chronic lower back pain     Past Surgical History  Procedure Laterality Date  . Toe amputation Left 2006  . Bunionectomy Bilateral 1980  . Thyroid surgery  1997    goiter removal  . Stress cardiolite  06/18/2006  . Tranthoracic echocardiogram  06/18/2006  . Electrocardiogram  05/29/2006  . Shoulder open rotator cuff repair Left     Dr. Sharol Given  . Av fistula placement  03/13/2012    Procedure: ARTERIOVENOUS (AV) FISTULA CREATION;  Surgeon: Conrad Cale, MD;  Location: [place];  Service: Vascular;  Laterality: Right;  . Cataract extraction, bilateral Bilateral     bilateral cataract removal  . Esophagoscopy w/ botox injection  07/22/2012    Procedure: ESOPHAGOSCOPY WITH BOTOX INJECTION;  Surgeon: Rozetta Nunnery, MD;  Location: [place];  Service: ENT;  Laterality: N/A;  esophageal dilation  . Insertion of dialysis catheter N/A 02/05/2013  Procedure: INSERTION OF DIALYSIS CATHETER;  Surgeon: Angelia Mould, MD;  Location: [place];  Service: Vascular;  Laterality: N/A;  Ultrasound guided  . Toe amputation Right Aug. 2015    2nd   . Laparoscopic cholecystectomy      Family History  Problem Relation Age of Onset  . Dementia Mother   . Hypertension Mother   . Coronary artery disease Other   . Hyperlipidemia Other   . Hypertension Other   . Ovarian cancer Other   . Stroke Other   . Hypertension Sister   . Hypertension Brother   . Heart attack Brother   . Stroke Brother     Social History:  reports that she has been smoking Cigarettes.  She has a 12.75 pack-year smoking history. She has never used smokeless tobacco. She reports that she does  not drink alcohol or use illicit drugs.  Allergies:  Allergies  Allergen Reactions  . Nsaids     REACTION: renal dysfunction  . Vancomycin     See comment under cephalosporin    Medications: I have reviewed the patient's current medications.   Results for orders placed or performed during the hospital encounter of 10/20/15 (from the past 48 hour(s))  CBC WITH DIFFERENTIAL     Status: Abnormal   Collection Time: 10/20/15  4:38 PM  Result Value Ref Range   WBC 9.7 4.0 - 10.5 K/uL   RBC 4.53 3.87 - 5.11 MIL/uL   Hemoglobin 13.5 12.0 - 15.0 g/dL   HCT 43.2 36.0 - 46.0 %   MCV 95.4 78.0 - 100.0 fL   MCH 29.8 26.0 - 34.0 pg   MCHC 31.3 30.0 - 36.0 g/dL   RDW 16.7 (H) 11.5 - 15.5 %   Platelets 220 150 - 400 K/uL   Neutrophils Relative % 81 %   Neutro Abs 7.9 (H) 1.7 - 7.7 K/uL   Lymphocytes Relative 12 %   Lymphs Abs 1.1 0.7 - 4.0 K/uL   Monocytes Relative 6 %   Monocytes Absolute 0.6 0.1 - 1.0 K/uL   Eosinophils Relative 1 %   Eosinophils Absolute 0.1 0.0 - 0.7 K/uL   Basophils Relative 0 %   Basophils Absolute 0.0 0.0 - 0.1 K/uL  Comprehensive metabolic panel     Status: Abnormal   Collection Time: 10/20/15  4:38 PM  Result Value Ref Range   Sodium 136 135 - 145 mmol/L   Potassium >7.5 (HH) 3.5 - 5.1 mmol/L    Comment: SLIGHT HEMOLYSIS REPEATED TO VERIFY CRITICAL RESULT CALLED TO, READ BACK BY AND VERIFIED WITH: K KELLY,RN 1812 10/20/2015 WBOND    Chloride 97 (L) 101 - 111 mmol/L   CO2 19 (L) 22 - 32 mmol/L   Glucose, Bld 92 65 - 99 mg/dL   BUN 63 (H) 6 - 20 mg/dL   Creatinine, Ser 9.47 (H) 0.44 - 1.00 mg/dL   Calcium 9.1 8.9 - 10.3 mg/dL   Total Protein 8.2 (H) 6.5 - 8.1 g/dL   Albumin 3.9 3.5 - 5.0 g/dL   AST 35 15 - 41 U/L   ALT 27 14 - 54 U/L   Alkaline Phosphatase 97 38 - 126 U/L   Total Bilirubin 1.8 (H) 0.3 - 1.2 mg/dL   GFR calc non Af Amer 4 (L) >60 mL/min   GFR calc Af Amer 4 (L) >60 mL/min    Comment: (NOTE) The eGFR has been calculated using the  CKD EPI equation. This calculation has not been validated in all  clinical situations. eGFR's persistently <60 mL/min signify possible Chronic Kidney Disease.    Anion gap 20 (H) 5 - 15  Influenza panel by PCR (type A & B, H1N1)     Status: Abnormal   Collection Time: 10/20/15  5:15 PM  Result Value Ref Range   Influenza A By PCR POSITIVE (A) NEGATIVE   Influenza B By PCR NEGATIVE NEGATIVE   H1N1 flu by pcr NOT DETECTED NOT DETECTED    Comment:        The Xpert Flu assay (FDA approved for nasal aspirates or washes and nasopharyngeal swab specimens), is intended as an aid in the diagnosis of influenza and should not be used as a sole basis for treatment.   MRSA PCR Screening     Status: None   Collection Time: 10/20/15  5:15 PM  Result Value Ref Range   MRSA by PCR NEGATIVE NEGATIVE    Comment:        The GeneXpert MRSA Assay (FDA approved for NASAL specimens only), is one component of a comprehensive MRSA colonization surveillance program. It is not intended to diagnose MRSA infection nor to guide or monitor treatment for MRSA infections.   CBC     Status: Abnormal   Collection Time: 10/20/15 10:00 PM  Result Value Ref Range   WBC 8.4 4.0 - 10.5 K/uL   RBC 4.15 3.87 - 5.11 MIL/uL   Hemoglobin 12.3 12.0 - 15.0 g/dL   HCT 38.7 36.0 - 46.0 %   MCV 93.3 78.0 - 100.0 fL   MCH 29.6 26.0 - 34.0 pg   MCHC 31.8 30.0 - 36.0 g/dL   RDW 16.6 (H) 11.5 - 15.5 %   Platelets 227 150 - 400 K/uL  Potassium     Status: Abnormal   Collection Time: 10/20/15 10:00 PM  Result Value Ref Range   Potassium >7.5 (HH) 3.5 - 5.1 mmol/L    Comment: SLIGHT HEMOLYSIS CRITICAL RESULT CALLED TO, READ BACK BY AND VERIFIED WITH: QIU R,RN 10/20/15 2238 WAYK   Renal function panel     Status: Abnormal   Collection Time: 10/21/15  2:09 AM  Result Value Ref Range   Sodium 135 135 - 145 mmol/L   Potassium 3.7 3.5 - 5.1 mmol/L    Comment: DELTA CHECK NOTED   Chloride 94 (L) 101 - 111 mmol/L   CO2  25 22 - 32 mmol/L   Glucose, Bld 96 65 - 99 mg/dL   BUN 31 (H) 6 - 20 mg/dL   Creatinine, Ser 5.16 (H) 0.44 - 1.00 mg/dL    Comment: DELTA CHECK NOTED   Calcium 8.4 (L) 8.9 - 10.3 mg/dL   Phosphorus 3.2 2.5 - 4.6 mg/dL   Albumin 3.1 (L) 3.5 - 5.0 g/dL   GFR calc non Af Amer 7 (L) >60 mL/min   GFR calc Af Amer 9 (L) >60 mL/min    Comment: (NOTE) The eGFR has been calculated using the CKD EPI equation. This calculation has not been validated in all clinical situations. eGFR's persistently <60 mL/min signify possible Chronic Kidney Disease.    Anion gap 16 (H) 5 - 15    X-ray Chest Pa And Lateral  10/20/2015  CLINICAL DATA:  Cough progressively getting worse. EXAM: CHEST  2 VIEW COMPARISON:  08/03/2014 FINDINGS: Lungs are hypoinflated with a 3.1 cm masslike opacity over the right upper lobe. Mild prominence of the perihilar markings. No evidence of effusion. Cardiomediastinal silhouette is within normal. There is calcified plaque over the  aortic arch. There are degenerative changes of the spine with curvature of the thoracic spine convex right unchanged. Degenerative change of the left shoulder. Surgical clips are present over the neck base. IMPRESSION: No acute cardiopulmonary disease per 3.1 cm masslike opacity over the right upper lobe. Recommend chest CT for further evaluation to exclude malignancy. Electronically Signed   By: Marin Olp M.D.   On: 10/20/2015 17:50    ROS: cough, fever, SOB, poor appetite- negative for edema  Blood pressure 106/52, pulse 110, temperature 98.8 F (37.1 C), temperature source Oral, resp. rate 18, weight 72.7 kg (160 lb 4.4 oz), SpO2 100 %. General appearance: somnolent on HD Eyes: conjunctivae/corneas clear. PERRL, EOM's intact. Fundi benign. Resp: bronchophony bilaterally Cardio: regular rate and rhythm, S1, S2 normal, no murmur, click, rub or gallop GI: soft, non-tender; bowel sounds normal; no masses,  no organomegaly Extremities: extremities  normal, atraumatic, no cyanosis or edema right upper arm AVF accessed  Assessment/Plan: 75 year old BF with multiple medical issues including ESRD now presenting with upper resp process- screen for flu is positive  1 ID- upper respiratory issue- flu screen positive- on tamiflu and supportive care.  Also "mass like opacity" on CXR- rec a CT- per priamry team 2 ESRD: normally TTS at [place] via AVF- emergent HD overnight for high K- will try to make it to next treatment which should be on Saturday 3 Hypertension: not an issue- does not seem to be that volume overloaded- is 4 kg UP? BP soft, not much UF tonight I am afraid 4. Anemia of ESRD: has not been an issue- no ESA as OP 5. Metabolic Bone Disease: continue hectorol and renvela 6. Hyperkalemia- still not sure it was accurate- K of 3.7 after 30 min of zero K- will check again on Saturday AM 7. DM- per primary - no meds- sugar OK    Birtie Fellman A 10/21/2015, 3:02 AM

## 2015-10-21 NOTE — Progress Notes (Signed)
Chaplain provided spiritual care visitation for patient. At the time of the visit the patient was not feeling up to a visit. Chaplai will follow up at a later time. Chaplain Yaakov Guthrie [phone]

## 2015-10-21 NOTE — Procedures (Signed)
Patient was seen on dialysis and the procedure was supervised.  BFR 400  Via AVF BP is  106/52.   Patient appears to be tolerating treatment well- had some low BP initially - better after albumin   Caren Garske A 10/21/2015

## 2015-10-22 ENCOUNTER — Inpatient Hospital Stay (HOSPITAL_COMMUNITY): Payer: Medicare Other

## 2015-10-22 ENCOUNTER — Encounter (HOSPITAL_COMMUNITY): Payer: Self-pay | Admitting: Radiology

## 2015-10-22 DIAGNOSIS — J45901 Unspecified asthma with (acute) exacerbation: Secondary | ICD-10-CM

## 2015-10-22 DIAGNOSIS — R918 Other nonspecific abnormal finding of lung field: Secondary | ICD-10-CM

## 2015-10-22 DIAGNOSIS — J441 Chronic obstructive pulmonary disease with (acute) exacerbation: Secondary | ICD-10-CM

## 2015-10-22 LAB — RENAL FUNCTION PANEL
Albumin: 3 g/dL — ABNORMAL LOW (ref 3.5–5.0)
Anion gap: 18 — ABNORMAL HIGH (ref 5–15)
BUN: 38 mg/dL — ABNORMAL HIGH (ref 6–20)
CO2: 23 mmol/L (ref 22–32)
Calcium: 8.5 mg/dL — ABNORMAL LOW (ref 8.9–10.3)
Chloride: 92 mmol/L — ABNORMAL LOW (ref 101–111)
Creatinine, Ser: 7.26 mg/dL — ABNORMAL HIGH (ref 0.44–1.00)
GFR calc Af Amer: 6 mL/min — ABNORMAL LOW (ref >60)
GFR calc non Af Amer: 5 mL/min — ABNORMAL LOW (ref >60)
Glucose, Bld: 130 mg/dL — ABNORMAL HIGH (ref 65–99)
Phosphorus: 5.2 mg/dL — ABNORMAL HIGH (ref 2.5–4.6)
Potassium: 5.1 mmol/L (ref 3.5–5.1)
Sodium: 133 mmol/L — ABNORMAL LOW (ref 135–145)

## 2015-10-22 LAB — CBC
HCT: 35.7 % — ABNORMAL LOW (ref 36.0–46.0)
Hemoglobin: 11.4 g/dL — ABNORMAL LOW (ref 12.0–15.0)
MCH: 29.8 pg (ref 26.0–34.0)
MCHC: 31.9 g/dL (ref 30.0–36.0)
MCV: 93.2 fL (ref 78.0–100.0)
Platelets: 187 10*3/uL (ref 150–400)
RBC: 3.83 MIL/uL — ABNORMAL LOW (ref 3.87–5.11)
RDW: 16.7 % — ABNORMAL HIGH (ref 11.5–15.5)
WBC: 7.8 10*3/uL (ref 4.0–10.5)

## 2015-10-22 MED ORDER — LIDOCAINE-PRILOCAINE 2.5-2.5 % EX CREA: 1.0000 "application " | TOPICAL_CREAM | CUTANEOUS | Status: DC | PRN

## 2015-10-22 MED ORDER — HEPARIN SODIUM (PORCINE) 1000 UNIT/ML DIALYSIS: 20.0000 [IU]/kg | INTRAMUSCULAR | Status: DC | PRN

## 2015-10-22 MED ORDER — SODIUM CHLORIDE 0.9 % IV SOLN: 100.0000 mL | INTRAVENOUS | Status: DC | PRN

## 2015-10-22 MED ORDER — VANCOMYCIN HCL IN DEXTROSE 750-5 MG/150ML-% IV SOLN
750.0000 mg | INTRAVENOUS | Status: DC
  Administered 2015-10-25 – 2015-10-27 (×2): 750 mg via INTRAVENOUS
  Filled 2015-10-22 (×2): qty 150

## 2015-10-22 MED ORDER — DOXERCALCIFEROL 4 MCG/2ML IV SOLN
INTRAVENOUS | Status: AC
  Filled 2015-10-22: qty 2

## 2015-10-22 MED ORDER — COLCHICINE 0.6 MG PO TABS
0.3000 mg | ORAL_TABLET | ORAL | Status: DC
  Administered 2015-10-24 – 2015-10-27 (×2): 0.3 mg via ORAL
  Filled 2015-10-22 (×2): qty 1

## 2015-10-22 MED ORDER — TRAMADOL HCL 50 MG PO TABS
50.0000 mg | ORAL_TABLET | Freq: Once | ORAL | Status: AC
  Administered 2015-10-22: 50 mg via ORAL
  Filled 2015-10-22: qty 1

## 2015-10-22 MED ORDER — PENTAFLUOROPROP-TETRAFLUOROETH EX AERO: 1.0000 "application " | INHALATION_SPRAY | CUTANEOUS | Status: DC | PRN

## 2015-10-22 MED ORDER — VANCOMYCIN HCL 10 G IV SOLR
1500.0000 mg | Freq: Once | INTRAVENOUS | Status: AC
  Administered 2015-10-22: 1500 mg via INTRAVENOUS
  Filled 2015-10-22: qty 1500

## 2015-10-22 MED ORDER — FAMOTIDINE 20 MG PO TABS
20.0000 mg | ORAL_TABLET | Freq: Every day | ORAL | Status: DC
  Administered 2015-10-23 – 2015-10-28 (×6): 20 mg via ORAL
  Filled 2015-10-22 (×6): qty 1

## 2015-10-22 MED ORDER — ALTEPLASE 2 MG IJ SOLR: 2.0000 mg | Freq: Once | INTRAMUSCULAR | Status: DC | PRN

## 2015-10-22 MED ORDER — IOHEXOL 300 MG/ML  SOLN
100.0000 mL | Freq: Once | INTRAMUSCULAR | Status: AC | PRN
  Administered 2015-10-22: 75 mL via INTRAVENOUS

## 2015-10-22 MED ORDER — LIDOCAINE HCL (PF) 1 % IJ SOLN: 5.0000 mL | INTRAMUSCULAR | Status: DC | PRN

## 2015-10-22 MED ORDER — PIPERACILLIN-TAZOBACTAM IN DEX 2-0.25 GM/50ML IV SOLN
2.2500 g | Freq: Three times a day (TID) | INTRAVENOUS | Status: DC
  Administered 2015-10-22 – 2015-10-27 (×15): 2.25 g via INTRAVENOUS
  Filled 2015-10-22 (×19): qty 50

## 2015-10-22 MED ORDER — HEPARIN SODIUM (PORCINE) 1000 UNIT/ML DIALYSIS: 1000.0000 [IU] | INTRAMUSCULAR | Status: DC | PRN

## 2015-10-22 NOTE — Procedures (Signed)
Tolerating HD.  No hemodynamic instability. [name] [name] C

## 2015-10-22 NOTE — Progress Notes (Signed)
[facility] Progress Note   Dialyzes at [place] 68.5- . HD Bath 2/2.5 calc, Dialyzer 180, Heparin yes. Access AVF. Profile #4  Hectorol 2, no ESA- last hgb over 12, last calc 8.4, PTH 257 and phos 6.0  Assessment/Plan: 75 year old BF with multiple medical issues including ESRD now presenting with upper resp process- screen for flu is positive and probably bronchogenic right lung cancer with mediastinal lymphadenopathy and occlusion of right mainstem bronchus  1  Influenza A -on tamiflu 2. Lung cancer - new finding on xray/CT - long smoking history pulmonary to see (hx of right RCC/ s/p nephrectomy); possible obstructive PNA 2  ESRD with hyperkalemia - required emergent HD; K 5.4 yesterday post HD - plan HD today; repeat BMP in adm 3 Hypertension/Volume - net UF 500 - try for edw today - generally able to get close to edw 4. Anemia of ESRD: has not been an issue- no ESA as OP hgb 12.3 5. Metabolic Bone Disease: continue hectorol and renvela 6. Hyperkalemia- K 5.4 yesterday post HD - reasonable given high pre K - recheck today and on Sunday post HD 7. DM- per primary - no meds- sugar OK  8. Nutrition - add nepro  Myriam Jacobson, PA-C [place] [phone] 10/22/2015,11:44 AM  LOS: 2 days    Renal Attending; I agree with the note above. She is aware of the lung mass results(CT). Jennessa Trigo C   Subjective:  I coughed all night   Objective Filed Vitals:   10/22/15 0606 10/22/15 0815 10/22/15 0840 10/22/15 1010  BP: 99/53  115/58   Pulse: 95  93 94  Temp: 99.4 F (37.4 C)  99 F (37.2 C)   TempSrc: Oral  Oral   Resp: '16  17 17  '$ Height:  '5\' 5"'$  (1.651 m)    Weight:      SpO2: 94%  90% 92%   Physical Exam General: alert sitting up in bed Heart: RRR Lungs: rhonchi on right with diffiuse wheezes esp on right- Abdomen:soft NT Extremities: no sig LE edmea Dialysis Access:  Right AVF + bruit  Additional Objective Labs: Basic  Metabolic Panel:  Recent Labs Lab 10/20/15 1638 10/20/15 2200 10/21/15 0209 10/21/15 1117  NA 136  --  135 137  K >7.5* >7.5* 3.7 5.4*  CL 97*  --  94* 94*  CO2 19*  --  25 27  GLUCOSE 92  --  96 81  BUN 63*  --  31* 18  CREATININE 9.47*  --  5.16* 4.97*  CALCIUM 9.1  --  8.4* 9.1  PHOS  --   --  3.2  --    Liver Function Tests:  Recent Labs Lab 10/20/15 1638 10/21/15 0209  AST 35  --   ALT 27  --   ALKPHOS 97  --   BILITOT 1.8*  --   PROT 8.2*  --   ALBUMIN 3.9 3.1*   CBC:  Recent Labs Lab 10/20/15 1638 10/20/15 2200  WBC 9.7 8.4  NEUTROABS 7.9*  --   HGB 13.5 12.3  HCT 43.2 38.7  MCV 95.4 93.3  PLT 220 227  Studies/Results: X-ray Chest Pa And Lateral  10/20/2015  CLINICAL DATA:  Cough progressively getting worse. EXAM: CHEST  2 VIEW COMPARISON:  08/03/2014 FINDINGS: Lungs are hypoinflated with a 3.1 cm masslike opacity over the right upper lobe. Mild prominence of the perihilar markings. No evidence of effusion. Cardiomediastinal silhouette is within normal. There is calcified  plaque over the aortic arch. There are degenerative changes of the spine with curvature of the thoracic spine convex right unchanged. Degenerative change of the left shoulder. Surgical clips are present over the neck base. IMPRESSION: No acute cardiopulmonary disease per 3.1 cm masslike opacity over the right upper lobe. Recommend chest CT for further evaluation to exclude malignancy. Electronically Signed   By: Marin Olp M.D.   On: 10/20/2015 17:50   Ct Chest W Contrast  10/22/2015  CLINICAL DATA:  Masslike opacity in the right upper lung on recent chest radiograph. EXAM: CT CHEST WITH CONTRAST TECHNIQUE: Multidetector CT imaging of the chest was performed during intravenous contrast administration. CONTRAST:  42m OMNIPAQUE IOHEXOL 300 MG/ML  SOLN COMPARISON:  10/20/2015 chest radiograph. FINDINGS: Mediastinum/Nodes: Normal heart size. No pericardial fluid/thickening. Left main, left  anterior descending, left circumflex and right coronary atherosclerosis. Great vessels are normal in course and caliber. No central pulmonary emboli. Thyroid appears surgically absent. Normal esophagus. No axillary adenopathy. Mildly enlarged 1.2 cm subcarinal node (series 2/ image 24). Mildly enlarged 1.1 cm AP window node (series 2/ image 19). Mildly enlarged 1.0 cm right hilar node (series 2/ image 24). No left hilar adenopathy. Lungs/Pleura: No pneumothorax. No pleural effusion. There is a 4.4 x 2.8 cm solid lung mass in the peripheral apical right upper lobe (series 3/ image 11), which abuts the visceral pleural along a broad base. There is material occluding the right upper lobe bronchus. There is patchy peribronchovascular ground-glass opacity throughout the central right upper lobe. There are 2 subpleural anterior right middle lobe pulmonary nodules, largest 3 mm (series 3/image 38). There is a 5 mm apical left upper lobe pulmonary nodule (series 3/image 10). Mild centrilobular emphysema and diffuse bronchial wall thickening. Subsegmental atelectasis in the dependent lower lobes. Upper abdomen: Atrophic appearing kidneys.  Cholecystectomy. Musculoskeletal: No aggressive appearing focal osseous lesions. Mild-to-moderate degenerative changes in the thoracic spine. IMPRESSION: 1. Peripheral apical right upper lobe 4.4 cm lung mass, which abuts the visceral pleural, suspicious for a primary bronchogenic carcinoma. 2. Right hilar and subcarinal and AP window mediastinal lymphadenopathy, suspicious for nodal metastases. 3. Occlusion of right mainstem bronchus by nonspecific material, possibly tumor and/or mucous plugging. Patchy peribronchovascular ground-glass opacity throughout the central right upper lobe, probably postobstructive pneumonitis. 4. Left upper lobe 5 mm pulmonary nodule, indeterminate. Tiny subpleural right middle lobe pulmonary nodules are probably benign. 5. Additional findings include coronary  atherosclerosis and mild emphysema. Electronically Signed   By: JIlona SorrelM.D.   On: 10/22/2015 09:10   Medications:   . albuterol  2.5 mg Nebulization BID  . allopurinol  100 mg Oral Daily  . amLODipine  5 mg Oral QHS  . atorvastatin  40 mg Oral Daily  . clonazePAM  1 mg Oral BID  . colchicine  0.6 mg Oral Daily  . docusate sodium  100 mg Oral BID  . doxercalciferol  2 mcg Intravenous Q T,Th,Sa-HD  . DULoxetine  60 mg Oral Daily  . famotidine  40 mg Oral Daily  . heparin  5,000 Units Subcutaneous 3 times per day  . levothyroxine  175 mcg Oral QAC breakfast  . montelukast  10 mg Oral QHS  . multivitamin  1 tablet Oral QHS  . oseltamivir  30 mg Oral Q T,Th,Sa-HD  . pregabalin  50 mg Oral Daily  . QUEtiapine  100 mg Oral QHS  . sevelamer carbonate  3,200 mg Oral TID WC  . sodium chloride flush  3 mL Intravenous  Q12H

## 2015-10-22 NOTE — Progress Notes (Addendum)
ANTIBIOTIC CONSULT NOTE - INITIAL  Pharmacy Consult for Vancomycin/Zosyn Indication: pneumonia  Allergies  Allergen Reactions  . Nsaids     REACTION: renal dysfunction  . Vancomycin     See comment under cephalosporin    Patient Measurements: Height: '5\' 5"'$  (165.1 cm) Weight: 154 lb 5.2 oz (70 kg) (standing) IBW/kg (Calculated) : 57  Vital Signs: Temp: 99 F (37.2 C) (03/18 0840) Temp Source: Oral (03/18 0840) BP: 115/58 mmHg (03/18 0840) Pulse Rate: 94 (03/18 1010) Intake/Output from previous day: 03/17 0701 - 03/18 0700 In: 1080 [P.O.:1080] Out: 0  Intake/Output from this shift: Total I/O In: 243 [P.O.:240; I.V.:3] Out: 0   Labs:  Recent Labs  10/20/15 1638 10/20/15 2200 10/21/15 0209 10/21/15 1117  WBC 9.7 8.4  --   --   HGB 13.5 12.3  --   --   PLT 220 227  --   --   CREATININE 9.47*  --  5.16* 4.97*   Estimated Creatinine Clearance: 9.6 mL/min (by C-G formula based on Cr of 4.97). No results for input(s): VANCOTROUGH, VANCOPEAK, VANCORANDOM, GENTTROUGH, GENTPEAK, GENTRANDOM, TOBRATROUGH, TOBRAPEAK, TOBRARND, AMIKACINPEAK, AMIKACINTROU, AMIKACIN in the last 72 hours.   Microbiology: Recent Results (from the past 720 hour(s))  MRSA PCR Screening     Status: None   Collection Time: 10/20/15  5:15 PM  Result Value Ref Range Status   MRSA by PCR NEGATIVE NEGATIVE Final    Comment:        The GeneXpert MRSA Assay (FDA approved for NASAL specimens only), is one component of a comprehensive MRSA colonization surveillance program. It is not intended to diagnose MRSA infection nor to guide or monitor treatment for MRSA infections.     Medical History: Past Medical History  Diagnosis Date  . HYPOTHYROIDISM 02/17/2007    s/p surgical removal of goiter in 1997  . HYPERLIPIDEMIA 02/17/2007  . GOUT 05/29/2007  . ANEMIA-NOS 05/29/2007  . ANXIETY 03/23/2010  . 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 DEPRESSION 02/17/2007  . RESTLESS LEG SYNDROME 05/29/2007  .  PERIPHERAL NEUROPATHY 05/29/2007  . HYPERTENSION 02/17/2007  . PERIPHERAL VASCULAR DISEASE 02/17/2007  . PEPTIC ULCER DISEASE 05/29/2007    "when I was in college"  . OSTEOPENIA 09/22/2009  . SEIZURE DISORDER 02/17/2007  . Memory loss 01/24/2010  . Personal history of colonic polyps 11/16/2009  . Palpitations 09/08/2010  . Vocal cord paralysis 1996  . 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 Chronic neck pain 12/13/2010  . GERD (gastroesophageal reflux disease)   . Critical lower limb ischemia   . ESRD on hemodialysis ([place]) 02/17/2007    Started dialysis April 2014.  Gets HD at [facility] on TTS schedule.  Cause of ESRD was HTN.     . Cancer of kidney ([place]) 10/07/2012    Followed per Dr Despina Pole, MD, urology, [place]   . ESRD (end stage renal disease) on dialysis ([place])     "TTS. [address]" (10/20/2015)  . 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 Heart murmur 10/02/2010    hx  . Chronic bronchitis ([place])   . Type II diabetes mellitus ([place]) 02/17/2007    "haven't had it since ~ 2010" (10/20/2015)  . COMMON MIGRAINE     "stress common migraines"  . Arthritis     "all over my body"  . Chronic lower back pain   . Renal insufficiency     Medications:  Scheduled:  . albuterol  2.5  mg Nebulization BID  . allopurinol  100 mg Oral Daily  . amLODipine  5 mg Oral QHS  . atorvastatin  40 mg Oral Daily  . clonazePAM  1 mg Oral BID  . colchicine  0.6 mg Oral Daily  . docusate sodium  100 mg Oral BID  . doxercalciferol  2 mcg Intravenous Q T,Th,Sa-HD  . DULoxetine  60 mg Oral Daily  . famotidine  40 mg Oral Daily  . heparin  5,000 Units Subcutaneous 3 times per day  . levothyroxine  175 mcg Oral QAC breakfast  . montelukast  10 mg Oral QHS  . multivitamin  1 tablet Oral QHS  . oseltamivir  30 mg Oral Q T,Th,Sa-HD  . pregabalin  50 mg Oral Daily  . QUEtiapine  100 mg Oral QHS  . sevelamer carbonate  3,200 mg Oral TID WC  . sodium chloride flush  3  mL Intravenous Q12H   Infusions:   PRN: sodium chloride, sodium chloride, sodium chloride, acetaminophen **OR** acetaminophen, albuterol, alteplase, benzonatate, heparin, heparin, HYDROcodone-acetaminophen, hydrOXYzine, lidocaine (PF), lidocaine-prilocaine, ondansetron, pentafluoroprop-tetrafluoroeth, sodium chloride flush  Assessment: 31 YOF with ESRD on HD presenting with cough and shortness of breath.  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  Pharmacy consulted to dose Vanc/Zosyn for PNA. WBC wnl, currently Afebrile.   Tamiflu 3/16>> (3/20) Vancomycin 3/18 >> Zosyn 3/18 >>  3/16 MRSA PCR neg 3/18 BC NGTD  3/16 Influenza A positive  Pt has listed rash allergy to vanc/cephalosporins but has received these antibiotics in the past since the documented allergy with no documented reactions.    Goal of Therapy:  Vancomycin trough level 15-20 mcg/ml  Plan:  Vancomycin LD of 1500 mg Vancomycin T/Th/Sat - HD Zosyn 2.25 gm IV q8h F/U HD schedule Measure antibiotic drug levels at steady state Follow up culture results  [place] 10/22/2015,12:14 PM

## 2015-10-22 NOTE — Consult Note (Signed)
Name: [name] [name] MRN: [id number] DOB: 05/19/1941    ADMISSION DATE:  10/20/2015 CONSULTATION DATE:  3/18  REFERRING MD :  Cleon Dew   CHIEF COMPLAINT:  cough  BRIEF PATIENT DESCRIPTION:75 yo AAF NAD at rest  SIGNIFICANT EVENTS  Lung mass  STUDIES:     HISTORY OF PRESENT ILLNESS:   75 yo daily smoker, EDRD, anxiety,DM,Depression, HTN, PVD, PUD, GERD, Cancer of kidney, SZ DO who was admitted 3/16 with 4 days of cough, NP, found to be Influenza A positive. Cxr was suspicious for RUL lung mass and CT of the chest was performed with findings of Rt apical 4.4 cm mass, Rt hilar and subcarinal lymphadenopathy, rt mainstem occulusion, LUL 5 mm nodule. PCCM was contacted per Dr. Cleon Dew for possible FOB and tissue dx. CT reveals adequate tissue for FOB.    PAST MEDICAL HISTORY :   has a past medical history of HYPOTHYROIDISM (02/17/2007); HYPERLIPIDEMIA (02/17/2007); GOUT (05/29/2007); ANEMIA-NOS (05/29/2007); ANXIETY (03/23/2010); CIGARETTE SMOKER (09/17/2007); DEPRESSION (02/17/2007); RESTLESS LEG SYNDROME (05/29/2007); PERIPHERAL NEUROPATHY (05/29/2007); HYPERTENSION (02/17/2007); PERIPHERAL VASCULAR DISEASE (02/17/2007); PEPTIC ULCER DISEASE (05/29/2007); OSTEOPENIA (09/22/2009); SEIZURE DISORDER (02/17/2007); Memory loss (01/24/2010); Personal history of colonic polyps (11/16/2009); Palpitations (09/08/2010); Vocal cord paralysis (1996); 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  has past surgical history that includes Toe amputation (Left, 2006); Bunionectomy (Bilateral, 1980); Thyroid surgery  (1997); stress Cardiolite (06/18/2006); tranthoracic echocardiogram (06/18/2006); electrocardiogram (05/29/2006); Shoulder open rotator cuff repair (Left); AV fistula placement (03/13/2012); Cataract extraction, bilateral (Bilateral); Esophagoscopy w/botox injection (07/22/2012); Insertion of dialysis catheter (N/A, 02/05/2013); Toe amputation (Right, Aug. 2015); and Laparoscopic cholecystectomy. Prior to Admission medications   Medication Sig Start Date End Date Taking? Authorizing Provider  acetaminophen (TYLENOL) 325 MG tablet Take 2 tablets (650 mg total) by mouth every 6 (six) hours as needed for mild pain (or Fever >/= 101). 04/05/14  Yes Delfina Redwood, MD  albuterol (PROVENTIL HFA;VENTOLIN HFA) 108 (90 BASE) MCG/ACT inhaler Inhale 2 puffs into the lungs every 6 (six) hours as needed for wheezing or shortness of breath. 06/15/15  Yes Biagio Borg, MD  allopurinol (ZYLOPRIM) 100 MG tablet Take 100 mg by mouth daily.  01/04/12  Yes Historical Provider, MD  allopurinol (ZYLOPRIM) 100 MG tablet TAKE ONE TABLET BY MOUTH ONCE DAILY 08/11/15  Yes Biagio Borg, MD  amLODipine (NORVASC) 5 MG tablet Take 5 mg by mouth daily.   Yes Historical Provider, MD  benzonatate (TESSALON) 100 MG capsule Take 100-200 mg by mouth every 6 (six) hours as needed for cough.   Yes Historical Provider, MD  clonazePAM (KLONOPIN) 2 MG tablet TAKE ONE TABLET BY MOUTH TWICE DAILY AS NEEDED 09/30/15  Yes Biagio Borg, MD  colchicine 0.6 MG tablet Take 0.6 mg by mouth daily.    Yes Historical Provider, MD  cyclobenzaprine (FLEXERIL) 10 MG tablet TAKE ONE TABLET BY MOUTH THREE TIMES DAILY AS NEEDED FOR MUSCLE SPASM 08/11/15  Yes Biagio Borg, MD  darbepoetin [facility]) 25 MCG/0.42ML SOLN injection Inject 25 mcg into the vein every 7 (seven) days. Every Thursday at dialysis   Yes Historical Provider, MD  docusate sodium (COLACE) 100 MG capsule Take 100 mg by mouth 2 (two) times daily.  Yes Historical Provider, MD  DULoxetine (CYMBALTA) 30  MG capsule Take 2 capsules (60 mg total) by mouth daily. 06/15/14  Yes Biagio Borg, MD  famotidine (PEPCID) 40 MG tablet Take 1 tablet (40 mg total) by mouth 2 (two) times daily. Patient taking differently: Take 40 mg by mouth daily.  12/20/14  Yes Amy S Esterwood, PA-C  HYDROcodone-acetaminophen (NORCO/VICODIN) 5-325 MG per tablet Take 1 tablet by mouth every 6 (six) hours as needed for severe pain. 08/05/14  Yes Oswald Hillock, MD  hydrOXYzine (ATARAX/VISTARIL) 25 MG tablet TAKE ONE TABLET BY MOUTH EVERY 6 HOURS AS NEEDED FOR ANXIETY. 08/11/15  Yes Biagio Borg, MD  levothyroxine (SYNTHROID, LEVOTHROID) 175 MCG tablet Take 1 tablet (175 mcg total) by mouth daily before breakfast. 03/02/15  Yes Biagio Borg, MD  mometasone-formoterol [facility]) 100-5 MCG/ACT AERO INHALE TWO PUFFS INTO LUNGS TWICE DAILY 06/15/15  Yes Biagio Borg, MD  multivitamin (RENA-VIT) TABS tablet Take 1 tablet by mouth daily.   Yes Historical Provider, MD  omeprazole (PRILOSEC) 40 MG capsule TAKE ONE CAPSULE BY MOUTH ONCE DAILY (PATIENT  NEEDS  OFFICE  VISIT  BEFORE  ANY  FURTHER  REFILLS) 10/04/15  Yes Biagio Borg, MD  ondansetron (ZOFRAN) 4 MG tablet Take 4 mg by mouth every 6 (six) hours as needed for nausea or vomiting.   Yes Historical Provider, MD  pregabalin (LYRICA) 25 MG capsule Take 25 mg by mouth 2 (two) times daily.   Yes Historical Provider, MD  pregabalin (LYRICA) 50 MG capsule Take 50 mg by mouth daily.   Yes Historical Provider, MD  QUEtiapine (SEROQUEL) 100 MG tablet TAKE ONE TABLET BY MOUTH AT BEDTIME 08/11/15  Yes Biagio Borg, MD  sevelamer carbonate (RENVELA) 800 MG tablet Take 3,200 mg by mouth 3 (three) times daily with meals. 4 tablets with each meal and 3 tablets with each snack. ( pt takes 7 times daily)   Yes Historical Provider, MD  atorvastatin (LIPITOR) 40 MG tablet TAKE ONE TABLET BY MOUTH ONCE DAILY 04/01/15   Biagio Borg, MD  azithromycin (ZITHROMAX Z-PAK) 250 MG tablet Use as directed 06/15/15   Biagio Borg, MD  doxercalciferol (HECTOROL) 4 MCG/2ML injection Inject 4 mcg into the vein Every Tuesday,Thursday,and Saturday with dialysis.    Historical Provider, MD  lactulose (CHRONULAC) 10 GM/15ML solution Take 30 mLs (20 g total) by mouth every other day. 05/27/15   Dixie Dials, MD  montelukast (SINGULAIR) 10 MG tablet TAKE ONE TABLET BY MOUTH AT BEDTIME 09/12/15   Biagio Borg, MD   Allergies  Allergen Reactions  . Cephalosporins Itching and Rash    Vanc and fortaz given at the same time in June for several doses at dialysis with systemic rash and itching; received zinacef 7/5 and had worseningsystemicrash/ itching and swelling of eyes - so unclear if allergic to either or both-  Pt denies this  . Nsaids     REACTION: renal dysfunction  . Pioglitazone Swelling    Actos REACTION: EDEMA  . Vancomycin Rash    See comment under cephalosporin    FAMILY HISTORY:  family history includes Coronary artery disease in her other; Dementia in her mother; Heart attack in her brother; Hyperlipidemia in her other; Hypertension in her brother, mother, other, and sister; Ovarian cancer in her other; Stroke in her brother and other. SOCIAL HISTORY:  reports that she has been smoking Cigarettes.  She has a 12.75 pack-year smoking history. She has  never used smokeless tobacco. She reports that she does not drink alcohol or use illicit drugs.  REVIEW OF SYSTEMS:   10 point review of system taken, please see HPI for positives and negatives.  SUBJECTIVE:   VITAL SIGNS: Temp:  [98.8 F (37.1 C)-99.5 F (37.5 C)] 99 F (37.2 C) (03/18 0840) Pulse Rate:  [83-116] 85 (03/18 1600) Resp:  [16-20] 20 (03/18 1232) BP: (92-138)/(52-81) 132/77 mmHg (03/18 1600) SpO2:  [90 %-100 %] 90 % (03/18 1232) Weight:  [150 lb 12.7 oz (68.4 kg)] 150 lb 12.7 oz (68.4 kg) (03/18 1232)  PHYSICAL EXAMINATION: General:  NWDAAF Neuro:  Intact HEENT: No JVD/LAN Cardiovascular: HSR RRR Lungs:  Coarse rhonchi  bilat Abdomen:+bs Musculoskeletal: Intact Skin: warm and dry   Recent Labs Lab 10/21/15 0209 10/21/15 1117 10/22/15 1107  NA 135 137 133*  K 3.7 5.4* 5.1  CL 94* 94* 92*  CO2 '25 27 23  '$ BUN 31* 18 38*  CREATININE 5.16* 4.97* 7.26*  GLUCOSE 96 81 130*    Recent Labs Lab 10/20/15 1638 10/20/15 2200 10/22/15 1107  HGB 13.5 12.3 11.4*  HCT 43.2 38.7 35.7*  WBC 9.7 8.4 7.8  PLT 220 227 187   X-ray Chest Pa And Lateral  10/20/2015  CLINICAL DATA:  Cough progressively getting worse. EXAM: CHEST  2 VIEW COMPARISON:  08/03/2014 FINDINGS: Lungs are hypoinflated with a 3.1 cm masslike opacity over the right upper lobe. Mild prominence of the perihilar markings. No evidence of effusion. Cardiomediastinal silhouette is within normal. There is calcified plaque over the aortic arch. There are degenerative changes of the spine with curvature of the thoracic spine convex right unchanged. Degenerative change of the left shoulder. Surgical clips are present over the neck base. IMPRESSION: No acute cardiopulmonary disease per 3.1 cm masslike opacity over the right upper lobe. Recommend chest CT for further evaluation to exclude malignancy. Electronically Signed   By: Marin Olp M.D.   On: 10/20/2015 17:50   Ct Chest W Contrast  10/22/2015  CLINICAL DATA:  Masslike opacity in the right upper lung on recent chest radiograph. EXAM: CT CHEST WITH CONTRAST TECHNIQUE: Multidetector CT imaging of the chest was performed during intravenous contrast administration. CONTRAST:  58m OMNIPAQUE IOHEXOL 300 MG/ML  SOLN COMPARISON:  10/20/2015 chest radiograph. FINDINGS: Mediastinum/Nodes: Normal heart size. No pericardial fluid/thickening. Left main, left anterior descending, left circumflex and right coronary atherosclerosis. Great vessels are normal in course and caliber. No central pulmonary emboli. Thyroid appears surgically absent. Normal esophagus. No axillary adenopathy. Mildly enlarged 1.2 cm subcarinal  node (series 2/ image 24). Mildly enlarged 1.1 cm AP window node (series 2/ image 19). Mildly enlarged 1.0 cm right hilar node (series 2/ image 24). No left hilar adenopathy. Lungs/Pleura: No pneumothorax. No pleural effusion. There is a 4.4 x 2.8 cm solid lung mass in the peripheral apical right upper lobe (series 3/ image 11), which abuts the visceral pleural along a broad base. There is material occluding the right upper lobe bronchus. There is patchy peribronchovascular ground-glass opacity throughout the central right upper lobe. There are 2 subpleural anterior right middle lobe pulmonary nodules, largest 3 mm (series 3/image 38). There is a 5 mm apical left upper lobe pulmonary nodule (series 3/image 10). Mild centrilobular emphysema and diffuse bronchial wall thickening. Subsegmental atelectasis in the dependent lower lobes. Upper abdomen: Atrophic appearing kidneys.  Cholecystectomy. Musculoskeletal: No aggressive appearing focal osseous lesions. Mild-to-moderate degenerative changes in the thoracic spine. IMPRESSION: 1. Peripheral apical right upper lobe 4.4  cm lung mass, which abuts the visceral pleural, suspicious for a primary bronchogenic carcinoma. 2. Right hilar and subcarinal and AP window mediastinal lymphadenopathy, suspicious for nodal metastases. 3. Occlusion of right mainstem bronchus by nonspecific material, possibly tumor and/or mucous plugging. Patchy peribronchovascular ground-glass opacity throughout the central right upper lobe, probably postobstructive pneumonitis. 4. Left upper lobe 5 mm pulmonary nodule, indeterminate. Tiny subpleural right middle lobe pulmonary nodules are probably benign. 5. Additional findings include coronary atherosclerosis and mild emphysema. Electronically Signed   By: Ilona Sorrel M.D.   On: 10/22/2015 09:10    ASSESSMENT    + influenza A   Acute exacerbation of COPD with asthma ([place])   Hypothyroidism   CIGARETTE SMOKER   Acute bronchitis   Lung mass    ESRD HD T TH Sat  Discussion: 75 yo daily smoker, EDRD, anxiety,DM,Depression, HTN, PVD, PUD, GERD, Cancer of kidney, SZ DO who was admitted 3/16 with 4 days of cough, NP, found to be Influenza A positive. Cxr was suspicious for RUL lung mass and CT of the chest was performed with findings of Rt apical 4.4 cm mass, Rt hilar and subcarinal lymphadenopathy, rt mainstem occulusion, LUL 5 mm nodule. PCCM was contacted per Dr. Cleon Dew for possible FOB and tissue dx. CT reveals adequate tissue for FOB.    PLAN:  When Bronchitis and flu better she may be amenable for FOB. Dr. Lamonte Sakai has reviewed CT scan.  Continue current Tx For current tx for flu.   Richardson Landry Minor ACNP [facility] PCCM Pager [phone] till 3 pm If no answer page [phone] 10/22/2015, 5:07 PM   Attending Note:  I have examined patient, reviewed labs, studies and notes. I have discussed the case with S Minor, and I agree with the data and plans as amended above. Pt with hx ESRD, seizures, renal cell CA, admitted with influenza. Part eval included CT chest that I have reviewed,  shows a RUL peripheral mass. She may also have a RUL bronchus endobronchial lesion. I believe she will need FOB to biopsy the proximal lesion, may also need to consider transthoracic needle biopsy to get the more peripheral lesion depending on how reachable it is by FOB. We will follow with you, help arrange this week as she recovers from her acute illness.    Baltazar Apo, MD, PhD 10/22/2015, 6:00 PM [facility] [phone] or if no answer [phone]

## 2015-10-22 NOTE — Progress Notes (Signed)
Patient was complaining of lower back and left shoulder pain.  She stated that, during CT this morning, she was told to raise her arms above her head, which caused her shoulder to hurt.  Per patient, she had surgery on her shoulder years ago.  RN called Dr. Merrilee Jansky office, which referred RN to the office of Dr. Terrence Dupont.  Verbal orders received from Dr. Terrence Dupont to administer one-time dose of tramadol 50 mg PO.  Will administer.  Will continue to monitor.

## 2015-10-22 NOTE — Progress Notes (Signed)
Subjective:  Complains of cough and right-sided pleuritic chest pain denies any hemoptysis. CT of the chest showed peripheral apical right upper lobe 4 x 4 cm lung mass suspicious for Riemer a bronchogenic carcinoma associated with right hilar and subcarinal mediastinal lymphadenopathy. Occlusion of right mainstem bronchus with possible postobstructive pneumonitis  Objective:  Vital Signs in the last 24 hours: Temp:  [98.8 F (37.1 C)-99.5 F (37.5 C)] 99 F (37.2 C) (03/18 0840) Pulse Rate:  [85-116] 94 (03/18 1010) Resp:  [16-20] 17 (03/18 1010) BP: (99-136)/(52-70) 115/58 mmHg (03/18 0840) SpO2:  [90 %-100 %] 92 % (03/18 1010)  Intake/Output from previous day: 03/17 0701 - 03/18 0700 In: 1080 [P.O.:1080] Out: 0  Intake/Output from this shift: Total I/O In: 243 [P.O.:240; I.V.:3] Out: 0   Physical Exam: Neck: no adenopathy, no carotid bruit, no JVD and supple, symmetrical, trachea midline Lungs: Right lung field rhonchi noted Heart: regular rate and rhythm, S1, S2 normal and Soft systolic murmur noted Abdomen: soft, non-tender; bowel sounds normal; no masses,  no organomegaly Extremities: extremities normal, atraumatic, no cyanosis or edema  Lab Results:  Recent Labs  10/20/15 1638 10/20/15 2200  WBC 9.7 8.4  HGB 13.5 12.3  PLT 220 227    Recent Labs  10/21/15 0209 10/21/15 1117  NA 135 137  K 3.7 5.4*  CL 94* 94*  CO2 25 27  GLUCOSE 96 81  BUN 31* 18  CREATININE 5.16* 4.97*   No results for input(s): TROPONINI in the last 72 hours.  Invalid input(s): CK, MB Hepatic Function Panel  Recent Labs  10/20/15 1638 10/21/15 0209  PROT 8.2*  --   ALBUMIN 3.9 3.1*  AST 35  --   ALT 27  --   ALKPHOS 97  --   BILITOT 1.8*  --    No results for input(s): CHOL in the last 72 hours. No results for input(s): PROTIME in the last 72 hours.  Imaging: Imaging results have been reviewed and X-ray Chest Pa And Lateral  10/20/2015  CLINICAL DATA:  Cough  progressively getting worse. EXAM: CHEST  2 VIEW COMPARISON:  08/03/2014 FINDINGS: Lungs are hypoinflated with a 3.1 cm masslike opacity over the right upper lobe. Mild prominence of the perihilar markings. No evidence of effusion. Cardiomediastinal silhouette is within normal. There is calcified plaque over the aortic arch. There are degenerative changes of the spine with curvature of the thoracic spine convex right unchanged. Degenerative change of the left shoulder. Surgical clips are present over the neck base. IMPRESSION: No acute cardiopulmonary disease per 3.1 cm masslike opacity over the right upper lobe. Recommend chest CT for further evaluation to exclude malignancy. Electronically Signed   By: Marin Olp M.D.   On: 10/20/2015 17:50   Ct Chest W Contrast  10/22/2015  CLINICAL DATA:  Masslike opacity in the right upper lung on recent chest radiograph. EXAM: CT CHEST WITH CONTRAST TECHNIQUE: Multidetector CT imaging of the chest was performed during intravenous contrast administration. CONTRAST:  53m OMNIPAQUE IOHEXOL 300 MG/ML  SOLN COMPARISON:  10/20/2015 chest radiograph. FINDINGS: Mediastinum/Nodes: Normal heart size. No pericardial fluid/thickening. Left main, left anterior descending, left circumflex and right coronary atherosclerosis. Great vessels are normal in course and caliber. No central pulmonary emboli. Thyroid appears surgically absent. Normal esophagus. No axillary adenopathy. Mildly enlarged 1.2 cm subcarinal node (series 2/ image 24). Mildly enlarged 1.1 cm AP window node (series 2/ image 19). Mildly enlarged 1.0 cm right hilar node (series 2/ image 24). No left  hilar adenopathy. Lungs/Pleura: No pneumothorax. No pleural effusion. There is a 4.4 x 2.8 cm solid lung mass in the peripheral apical right upper lobe (series 3/ image 11), which abuts the visceral pleural along a broad base. There is material occluding the right upper lobe bronchus. There is patchy peribronchovascular  ground-glass opacity throughout the central right upper lobe. There are 2 subpleural anterior right middle lobe pulmonary nodules, largest 3 mm (series 3/image 38). There is a 5 mm apical left upper lobe pulmonary nodule (series 3/image 10). Mild centrilobular emphysema and diffuse bronchial wall thickening. Subsegmental atelectasis in the dependent lower lobes. Upper abdomen: Atrophic appearing kidneys.  Cholecystectomy. Musculoskeletal: No aggressive appearing focal osseous lesions. Mild-to-moderate degenerative changes in the thoracic spine. IMPRESSION: 1. Peripheral apical right upper lobe 4.4 cm lung mass, which abuts the visceral pleural, suspicious for a primary bronchogenic carcinoma. 2. Right hilar and subcarinal and AP window mediastinal lymphadenopathy, suspicious for nodal metastases. 3. Occlusion of right mainstem bronchus by nonspecific material, possibly tumor and/or mucous plugging. Patchy peribronchovascular ground-glass opacity throughout the central right upper lobe, probably postobstructive pneumonitis. 4. Left upper lobe 5 mm pulmonary nodule, indeterminate. Tiny subpleural right middle lobe pulmonary nodules are probably benign. 5. Additional findings include coronary atherosclerosis and mild emphysema. Electronically Signed   By: Ilona Sorrel M.D.   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

## 2015-10-23 LAB — BASIC METABOLIC PANEL
Anion gap: 14 (ref 5–15)
BUN: 16 mg/dL (ref 6–20)
CO2: 27 mmol/L (ref 22–32)
Calcium: 8.7 mg/dL — ABNORMAL LOW (ref 8.9–10.3)
Chloride: 95 mmol/L — ABNORMAL LOW (ref 101–111)
Creatinine, Ser: 4.59 mg/dL — ABNORMAL HIGH (ref 0.44–1.00)
GFR calc Af Amer: 10 mL/min — ABNORMAL LOW (ref >60)
GFR calc non Af Amer: 9 mL/min — ABNORMAL LOW (ref >60)
Glucose, Bld: 104 mg/dL — ABNORMAL HIGH (ref 65–99)
Potassium: 4.3 mmol/L (ref 3.5–5.1)
Sodium: 136 mmol/L (ref 135–145)

## 2015-10-23 NOTE — Progress Notes (Signed)
Subjective:  Appreciate pulmonary/critical care team consult and help. States coughing and right-sided pleuritic chest pain slightly better  Objective:  Vital Signs in the last 24 hours: Temp:  [98.4 F (36.9 C)-100.2 F (37.9 C)] 98.9 F (37.2 C) (03/19 0639) Pulse Rate:  [83-94] 83 (03/19 0639) Resp:  [17-24] 21 (03/19 0639) BP: (92-148)/(32-87) 117/57 mmHg (03/19 0639) SpO2:  [90 %-95 %] 90 % (03/19 0639) Weight:  [63.957 kg (141 lb)-70.1 kg (154 lb 8.7 oz)] 63.957 kg (141 lb) (03/18 2013)  Intake/Output from previous day: 03/18 0701 - 03/19 0700 In: 753 [P.O.:600; I.V.:3; IV Piggyback:150] Out: 0  Intake/Output from this shift:    Physical Exam: Neck: no adenopathy, no carotid bruit, no JVD and supple, symmetrical, trachea midline Lungs: Right lung field rhonchi and decreased breath sounds at left base noted Heart: regular rate and rhythm, S1, S2 normal and Soft systolic murmur noted Abdomen: soft, non-tender; bowel sounds normal; no masses,  no organomegaly Extremities: extremities normal, atraumatic, no cyanosis or edema  Lab Results:  Recent Labs  10/20/15 2200 10/22/15 1107  WBC 8.4 7.8  HGB 12.3 11.4*  PLT 227 187    Recent Labs  10/22/15 1107 10/23/15 0523  NA 133* 136  K 5.1 4.3  CL 92* 95*  CO2 23 27  GLUCOSE 130* 104*  BUN 38* 16  CREATININE 7.26* 4.59*   No results for input(s): TROPONINI in the last 72 hours.  Invalid input(s): CK, MB Hepatic Function Panel  Recent Labs  10/20/15 1638  10/22/15 1107  PROT 8.2*  --   --   ALBUMIN 3.9  < > 3.0*  AST 35  --   --   ALT 27  --   --   ALKPHOS 97  --   --   BILITOT 1.8*  --   --   < > = values in this interval not displayed. No results for input(s): CHOL in the last 72 hours. No results for input(s): PROTIME in the last 72 hours.  Imaging: Imaging results have been reviewed and Ct Chest W Contrast  10/22/2015  CLINICAL DATA:  Masslike opacity in the right upper lung on recent chest  radiograph. EXAM: CT CHEST WITH CONTRAST TECHNIQUE: Multidetector CT imaging of the chest was performed during intravenous contrast administration. CONTRAST:  46m OMNIPAQUE IOHEXOL 300 MG/ML  SOLN COMPARISON:  10/20/2015 chest radiograph. FINDINGS: Mediastinum/Nodes: Normal heart size. No pericardial fluid/thickening. Left main, left anterior descending, left circumflex and right coronary atherosclerosis. Great vessels are normal in course and caliber. No central pulmonary emboli. Thyroid appears surgically absent. Normal esophagus. No axillary adenopathy. Mildly enlarged 1.2 cm subcarinal node (series 2/ image 24). Mildly enlarged 1.1 cm AP window node (series 2/ image 19). Mildly enlarged 1.0 cm right hilar node (series 2/ image 24). No left hilar adenopathy. Lungs/Pleura: No pneumothorax. No pleural effusion. There is a 4.4 x 2.8 cm solid lung mass in the peripheral apical right upper lobe (series 3/ image 11), which abuts the visceral pleural along a broad base. There is material occluding the right upper lobe bronchus. There is patchy peribronchovascular ground-glass opacity throughout the central right upper lobe. There are 2 subpleural anterior right middle lobe pulmonary nodules, largest 3 mm (series 3/image 38). There is a 5 mm apical left upper lobe pulmonary nodule (series 3/image 10). Mild centrilobular emphysema and diffuse bronchial wall thickening. Subsegmental atelectasis in the dependent lower lobes. Upper abdomen: Atrophic appearing kidneys.  Cholecystectomy. Musculoskeletal: No aggressive appearing focal osseous lesions.  Mild-to-moderate degenerative changes in the thoracic spine. IMPRESSION: 1. Peripheral apical right upper lobe 4.4 cm lung mass, which abuts the visceral pleural, suspicious for a primary bronchogenic carcinoma. 2. Right hilar and subcarinal and AP window mediastinal lymphadenopathy, suspicious for nodal metastases. 3. Occlusion of right mainstem bronchus by nonspecific material,  possibly tumor and/or mucous plugging. Patchy peribronchovascular ground-glass opacity throughout the central right upper lobe, probably postobstructive pneumonitis. 4. Left upper lobe 5 mm pulmonary nodule, indeterminate. Tiny subpleural right middle lobe pulmonary nodules are probably benign. 5. Additional findings include coronary atherosclerosis and mild emphysema. Electronically Signed   By: Ilona Sorrel M.D.   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 Doylene Canard to follow from a.m. Possible fiberoptic bronchoscopy/biopsy in a.m. per CCM  LOS: 3 days    [name] [name] 10/23/2015, 8:38 AM

## 2015-10-23 NOTE — Progress Notes (Signed)
Assessment/Plan: 75 year old BF with multiple medical issues including ESRD now presenting with upper resp process- screen for flu is positive and right upper lung mass with mediastinal lymphadenopathy and occlusion of right mainstem bronchus  1 Influenza A -on tamiflu 2. Lung mass - new finding on xray/CT - smoking history(hx of right RCC/ s/p nephrectomy); obstructive PNA, per Pulmonary. 2 ESRD TTS [facility] (EDW 68.5-HD [place] 2/2.5 calc, Dialyzer 180, Heparin yes. Access AVF). HD Tues 3  Hypertension/Volume - ok Not sure of correctness of weights  Subjective: Interval History: Diff with coughing  Objective: Vital signs in last 24 hours: Temp:  [98.4 F (36.9 C)-100.2 F (37.9 C)] 99.1 F (37.3 C) (03/19 0902) Pulse Rate:  [83-94] 84 (03/19 0955) Resp:  [16-24] 16 (03/19 0955) BP: (92-148)/(32-87) 128/76 mmHg (03/19 0902) SpO2:  [90 %-95 %] 94 % (03/19 0902) Weight:  [63.957 kg (141 lb)-70.1 kg (154 lb 8.7 oz)] 63.957 kg (141 lb) (03/18 2013) Weight change:   Intake/Output from previous day: 03/18 0701 - 03/19 0700 In: 753 [P.O.:600; I.V.:3; IV Piggyback:150] Out: 0  Intake/Output this shift: Total I/O In: 240 [P.O.:240] Out: 0   General appearance: alert and cooperative Resp: rhonchi bilaterally and wheezes bilaterally Cardio: regular rate and rhythm, S1, S2 normal, no murmur, click, rub or gallop Extremities: extremities normal, atraumatic, no cyanosis or edema  Lab Results:  Recent Labs  10/20/15 2200 10/22/15 1107  WBC 8.4 7.8  HGB 12.3 11.4*  HCT 38.7 35.7*  PLT 227 187   BMET:  Recent Labs  10/22/15 1107 10/23/15 0523  NA 133* 136  K 5.1 4.3  CL 92* 95*  CO2 23 27  GLUCOSE 130* 104*  BUN 38* 16  CREATININE 7.26* 4.59*  CALCIUM 8.5* 8.7*   No results for input(s): PTH in the last 72 hours. Iron Studies: No results for input(s): IRON, TIBC, TRANSFERRIN, FERRITIN in the last 72 hours. Studies/Results: Ct Chest W Contrast  10/22/2015  CLINICAL DATA:   Masslike opacity in the right upper lung on recent chest radiograph. EXAM: CT CHEST WITH CONTRAST TECHNIQUE: Multidetector CT imaging of the chest was performed during intravenous contrast administration. CONTRAST:  66m OMNIPAQUE IOHEXOL 300 MG/ML  SOLN COMPARISON:  10/20/2015 chest radiograph. FINDINGS: Mediastinum/Nodes: Normal heart size. No pericardial fluid/thickening. Left main, left anterior descending, left circumflex and right coronary atherosclerosis. Great vessels are normal in course and caliber. No central pulmonary emboli. Thyroid appears surgically absent. Normal esophagus. No axillary adenopathy. Mildly enlarged 1.2 cm subcarinal node (series 2/ image 24). Mildly enlarged 1.1 cm AP window node (series 2/ image 19). Mildly enlarged 1.0 cm right hilar node (series 2/ image 24). No left hilar adenopathy. Lungs/Pleura: No pneumothorax. No pleural effusion. There is a 4.4 x 2.8 cm solid lung mass in the peripheral apical right upper lobe (series 3/ image 11), which abuts the visceral pleural along a broad base. There is material occluding the right upper lobe bronchus. There is patchy peribronchovascular ground-glass opacity throughout the central right upper lobe. There are 2 subpleural anterior right middle lobe pulmonary nodules, largest 3 mm (series 3/image 38). There is a 5 mm apical left upper lobe pulmonary nodule (series 3/image 10). Mild centrilobular emphysema and diffuse bronchial wall thickening. Subsegmental atelectasis in the dependent lower lobes. Upper abdomen: Atrophic appearing kidneys.  Cholecystectomy. Musculoskeletal: No aggressive appearing focal osseous lesions. Mild-to-moderate degenerative changes in the thoracic spine. IMPRESSION: 1. Peripheral apical right upper lobe 4.4 cm lung mass, which abuts the visceral pleural, suspicious  for a primary bronchogenic carcinoma. 2. Right hilar and subcarinal and AP window mediastinal lymphadenopathy, suspicious for nodal metastases. 3.  Occlusion of right mainstem bronchus by nonspecific material, possibly tumor and/or mucous plugging. Patchy peribronchovascular ground-glass opacity throughout the central right upper lobe, probably postobstructive pneumonitis. 4. Left upper lobe 5 mm pulmonary nodule, indeterminate. Tiny subpleural right middle lobe pulmonary nodules are probably benign. 5. Additional findings include coronary atherosclerosis and mild emphysema. Electronically Signed   By: Ilona Sorrel M.D.   On: 10/22/2015 09:10    Scheduled: . albuterol  2.5 mg Nebulization BID  . allopurinol  100 mg Oral Daily  . amLODipine  5 mg Oral QHS  . atorvastatin  40 mg Oral Daily  . clonazePAM  1 mg Oral BID  . [START ON 10/24/2015] colchicine  0.3 mg Oral Once per day on Mon Thu  . docusate sodium  100 mg Oral BID  . doxercalciferol  2 mcg Intravenous Q T,Th,Sa-HD  . DULoxetine  60 mg Oral Daily  . famotidine  20 mg Oral Daily  . heparin  5,000 Units Subcutaneous 3 times per day  . levothyroxine  175 mcg Oral QAC breakfast  . montelukast  10 mg Oral QHS  . multivitamin  1 tablet Oral QHS  . piperacillin-tazobactam (ZOSYN)  IV  2.25 g Intravenous 3 times per day  . pregabalin  50 mg Oral Daily  . QUEtiapine  100 mg Oral QHS  . sevelamer carbonate  3,200 mg Oral TID WC  . sodium chloride flush  3 mL Intravenous Q12H  . [START ON 10/25/2015] vancomycin  750 mg Intravenous Q T,Th,Sa-HD    LOS: 3 days   Luismiguel Lamere C 10/23/2015,11:47 AM

## 2015-10-24 NOTE — Consult Note (Signed)
   Name: [name] [name] MRN: [id number] DOB: 11-11-1940    ADMISSION DATE:  10/20/2015 CONSULTATION DATE:  3/18  REFERRING MD :  Cleon Dew   CHIEF COMPLAINT:  cough  BRIEF PATIENT DESCRIPTION:75 yo AAF NAD at rest  SIGNIFICANT EVENTS  Lung mass  STUDIES:     HISTORY OF PRESENT ILLNESS:   75 yo daily smoker, EDRD, anxiety,DM,Depression, HTN, PVD, PUD, GERD, Cancer of kidney, SZ DO who was admitted 3/16 with 4 days of cough, NP, found to be Influenza A positive. Cxr was suspicious for RUL lung mass and CT of the chest was performed with findings of Rt apical 4.4 cm mass, Rt hilar and subcarinal lymphadenopathy, rt mainstem occulusion, LUL 5 mm nodule. PCCM was contacted per Dr. Cleon Dew for possible FOB and tissue dx. CT reveals adequate tissue for FOB.    SUBJECTIVE:  Breathing better No cough  VITAL SIGNS: Temp:  [98 F (36.7 C)-99 F (37.2 C)] 98 F (36.7 C) (03/20 1000) Pulse Rate:  [77-88] 77 (03/20 1000) Resp:  [18-20] 20 (03/20 1000) BP: (121-133)/(62-71) 125/63 mmHg (03/20 1000) SpO2:  [91 %-98 %] 98 % (03/20 1000) Weight:  [141 lb 8.6 oz (64.2 kg)] 141 lb 8.6 oz (64.2 kg) (03/19 2017)  PHYSICAL EXAMINATION: General:  NWDAAF Neuro:  Intact HEENT: No JVD/LAN Cardiovascular: HSR RRR Lungs:  decreased rhonchi bilat Abdomen:+bs Musculoskeletal: Intact Skin: warm and dry   Recent Labs Lab 10/21/15 1117 10/22/15 1107 10/23/15 0523  NA 137 133* 136  K 5.4* 5.1 4.3  CL 94* 92* 95*  CO2 '27 23 27  '$ BUN 18 38* 16  CREATININE 4.97* 7.26* 4.59*  GLUCOSE 81 130* 104*    Recent Labs Lab 10/20/15 1638 10/20/15 2200 10/22/15 1107  HGB 13.5 12.3 11.4*  HCT 43.2 38.7 35.7*  WBC 9.7 8.4 7.8  PLT 220 227 187   No results found.  ASSESSMENT    + influenza A   Acute exacerbation of COPD with asthma ([place])   Hypothyroidism   [employer]   Acute bronchitis   Lung mass   ESRD HD T TH Sat  Discussion: 75 yo daily smoker, EDRD, anxiety,DM,Depression,  HTN, PVD, PUD, GERD, Cancer of kidney, SZ DO who was admitted 3/16 with 4 days of cough, NP, found to be Influenza A positive. Cxr was suspicious for RUL lung mass and CT of the chest was performed with findings of Rt apical 4.4 cm mass, Rt hilar and subcarinal lymphadenopathy, rt mainstem occulusion, LUL 5 mm nodule. PCCM was contacted per Dr. Cleon Dew for possible FOB and tissue dx. She may also have a RUL bronchus endobronchial lesion. I believe she will need FOB to biopsy the proximal lesion, may also need to consider transthoracic needle biopsy to get the more peripheral lesion depending on how reachable it is by FOB    PLAN: Bronchsocopy arranged for 3/21 0800 Npo after MN She will need PEt scan as outpt  Kara Mead MD. [facility]. [place] [facility] Pager [phone] If no response call [phone]   10/24/2015  10/24/2015, 12:44 PM    .

## 2015-10-24 NOTE — Progress Notes (Signed)
[facility] Progress Note  Assessment/Plan: 1. Influenza A: Improving, on Tamiflu. Tmax 99.0 WBC 7.8 2. Lung Mass: Rt apical 4.4 cm mass, Rt hilar and subcarinal lymphadenopathy, rt mainstem occulusion, LUL 5 mm nodule confirmed per CT. PCCM following. For possible FOB today or tomorrow.  2. ESRD -TTS at [facility]. For Hd tomorrow.  3. Anemia - HGB 11.4. No OP ESA. Follow HGB 4. Metabolic Bone Disease/Secondary hyperparathyroidism -continue hectoral/Renvela. Last Ca 8.7 C Ca 9.5 phos 5.2 5. HTN/volume - BP controlled. Last HD 03/18 Net UF 500 post wt 70.1 kg wt today 64 kg ??? Accuracy. Recheck. For HD tomorrow. If wt is correct, will run even.  6. Nutrition - Albumin 3.0. Renal diet, renal vit. Supplement.  7. COPD exacerbation: per primary   Rita H. Brown NP-C 10/24/2015, 11:37 AM  [facility] [phone]  Pt seen, examined and agree w A/P as above. Has been eating lemons for a "sour stomach" which likely cause of high K.  Feeling better, bronch cancelled, postponed to tomorrow.  Kelly Splinter MD [facility] pager [phone]    cell [phone] 10/24/2015, 1:31 PM    Subjective: Is on phone with Dr. Merrilee Jansky office inquiring about FOB.  "I feel a little better". Denies SOB at present  Objective Filed Vitals:   10/23/15 1818 10/23/15 2017 10/23/15 2154 10/24/15 1000  BP: 133/62 121/71  125/63  Pulse: 83 88  77  Temp: 98 F (36.7 C) 99 F (37.2 C)  98 F (36.7 C)  TempSrc: Oral Oral  Oral  Resp: '18 20  20  '$ Height:      Weight:  64.2 kg (141 lb 8.6 oz)    SpO2: 91% 94% 95% 98%   Physical Exam General: alert sitting up in bed Heart: RRR II/VI Systolic M. No JVD.  Lungs: Bilateral breath sounds, few scattered inspiratory wheezes, slightly decin bases Abdomen:soft non-tender, active BS Extremities: no LE edema Dialysis Access: RUA AVF + bruit  Dialysis Orders: TueThuSat, 3 hrs 45 min, 180NRe Optiflux, BFR 400, DFR Manual 800  mL/min, EDW 68.5 (kg), Dialysate 2.0 K, 2.5 Ca, UFR Profile: Profile 4, Sodium Model: Linear, Access: RUA AV Fistula Heparin: 2200 units per treatment Hectoral: 2 mcg IV Q tx TTS Venofer: 50 mg IV weekly (being restarted after Fe load T Sat 23% 09/30/15)  Additional Objective Labs: Basic Metabolic Panel:  Recent Labs Lab 10/21/15 0209 10/21/15 1117 10/22/15 1107 10/23/15 0523  NA 135 137 133* 136  K 3.7 5.4* 5.1 4.3  CL 94* 94* 92* 95*  CO2 '25 27 23 27  '$ GLUCOSE 96 81 130* 104*  BUN 31* 18 38* 16  CREATININE 5.16* 4.97* 7.26* 4.59*  CALCIUM 8.4* 9.1 8.5* 8.7*  PHOS 3.2  --  5.2*  --    Liver Function Tests:  Recent Labs Lab 10/20/15 1638 10/21/15 0209 10/22/15 1107  AST 35  --   --   ALT 27  --   --   ALKPHOS 97  --   --   BILITOT 1.8*  --   --   PROT 8.2*  --   --   ALBUMIN 3.9 3.1* 3.0*   No results for input(s): LIPASE, AMYLASE in the last 168 hours. CBC:  Recent Labs Lab 10/20/15 1638 10/20/15 2200 10/22/15 1107  WBC 9.7 8.4 7.8  NEUTROABS 7.9*  --   --   HGB 13.5 12.3 11.4*  HCT 43.2 38.7 35.7*  MCV 95.4 93.3 93.2  PLT 220 227 187  Blood Culture    Component Value Date/Time   SDES BLOOD BLOOD LEFT ARM 10/22/2015 1128   SPECREQUEST BOTTLES DRAWN AEROBIC ONLY 5CC 10/22/2015 1128   CULT NO GROWTH < 24 HOURS 10/22/2015 1128   REPTSTATUS PENDING 10/22/2015 1128    Cardiac Enzymes: No results for input(s): CKTOTAL, CKMB, CKMBINDEX, TROPONINI in the last 168 hours. CBG: No results for input(s): GLUCAP in the last 168 hours. Iron Studies: No results for input(s): IRON, TIBC, TRANSFERRIN, FERRITIN in the last 72 hours. '@lablastinr3'$ @ Studies/Results: No results found. Medications:   . albuterol  2.5 mg Nebulization BID  . allopurinol  100 mg Oral Daily  . amLODipine  5 mg Oral QHS  . atorvastatin  40 mg Oral Daily  . clonazePAM  1 mg Oral BID  . colchicine  0.3 mg Oral Once per day on Mon Thu  . docusate sodium  100 mg Oral BID  .  doxercalciferol  2 mcg Intravenous Q T,Th,Sa-HD  . DULoxetine  60 mg Oral Daily  . famotidine  20 mg Oral Daily  . heparin  5,000 Units Subcutaneous 3 times per day  . levothyroxine  175 mcg Oral QAC breakfast  . montelukast  10 mg Oral QHS  . multivitamin  1 tablet Oral QHS  . piperacillin-tazobactam (ZOSYN)  IV  2.25 g Intravenous 3 times per day  . pregabalin  50 mg Oral Daily  . QUEtiapine  100 mg Oral QHS  . sevelamer carbonate  3,200 mg Oral TID WC  . sodium chloride flush  3 mL Intravenous Q12H  . [START ON 10/25/2015] vancomycin  750 mg Intravenous Q T,Th,Sa-HD

## 2015-10-24 NOTE — Progress Notes (Signed)
Ref: Cathlean Cower, MD   Subjective:  Feeling better. Awaiting Bronchoscopy with biopsy. Now aware of lung mass.  Objective:  Vital Signs in the last 24 hours: Temp:  [98 F (36.7 C)-99 F (37.2 C)] 98 F (36.7 C) (03/20 1746) Pulse Rate:  [77-88] 87 (03/20 1746) Cardiac Rhythm:  [-] Normal sinus rhythm (03/20 1024) Resp:  [18-20] 20 (03/20 1746) BP: (121-133)/(62-71) 127/65 mmHg (03/20 1746) SpO2:  [91 %-98 %] 96 % (03/20 1746) Weight:  [64.2 kg (141 lb 8.6 oz)] 64.2 kg (141 lb 8.6 oz) (03/19 2017)  Physical Exam: BP Readings from Last 1 Encounters:  10/24/15 127/65    Wt Readings from Last 1 Encounters:  10/23/15 64.2 kg (141 lb 8.6 oz)    Weight change: -4.2 kg (-9 lb 4.1 oz)  HEENT: [place]/AT, Eyes-Brown, PERL, EOMI, Conjunctiva-Pink, Sclera-Non-icteric Neck: No JVD, No bruit, Trachea midline. Lungs:  Mild wheezing, Bilateral. Cardiac:  Regular rhythm, normal S1 and S2, no S3. II/VI systolic murmur. Abdomen:  Soft, non-tender. Extremities:  Right upper arm AVF. No edema present. No cyanosis. No clubbing. CNS: AxOx3, Cranial nerves grossly intact, moves all 4 extremities. Right handed. Skin: Warm and dry.   Intake/Output from previous day: 03/19 0701 - 03/20 0700 In: 510 [P.O.:360; IV Piggyback:150] Out: 0     Lab Results: BMET    Component Value Date/Time   NA 136 10/23/2015 0523   NA 133* 10/22/2015 1107   NA 137 10/21/2015 1117   K 4.3 10/23/2015 0523   K 5.1 10/22/2015 1107   K 5.4* 10/21/2015 1117   CL 95* 10/23/2015 0523   CL 92* 10/22/2015 1107   CL 94* 10/21/2015 1117   CO2 27 10/23/2015 0523   CO2 23 10/22/2015 1107   CO2 27 10/21/2015 1117   GLUCOSE 104* 10/23/2015 0523   GLUCOSE 130* 10/22/2015 1107   GLUCOSE 81 10/21/2015 1117   BUN 16 10/23/2015 0523   BUN 38* 10/22/2015 1107   BUN 18 10/21/2015 1117   CREATININE 4.59* 10/23/2015 0523   CREATININE 7.26* 10/22/2015 1107   CREATININE 4.97* 10/21/2015 1117   CALCIUM 8.7* 10/23/2015 0523   CALCIUM 8.5* 10/22/2015 1107   CALCIUM 9.1 10/21/2015 1117   GFRNONAA 9* 10/23/2015 0523   GFRNONAA 5* 10/22/2015 1107   GFRNONAA 8* 10/21/2015 1117   GFRAA 10* 10/23/2015 0523   GFRAA 6* 10/22/2015 1107   GFRAA 9* 10/21/2015 1117   CBC    Component Value Date/Time   WBC 7.8 10/22/2015 1107   RBC 3.83* 10/22/2015 1107   RBC 3.08* 03/09/2012 0500   HGB 11.4* 10/22/2015 1107   HCT 35.7* 10/22/2015 1107   PLT 187 10/22/2015 1107   MCV 93.2 10/22/2015 1107   MCH 29.8 10/22/2015 1107   MCHC 31.9 10/22/2015 1107   RDW 16.7* 10/22/2015 1107   LYMPHSABS 1.1 10/20/2015 1638   MONOABS 0.6 10/20/2015 1638   EOSABS 0.1 10/20/2015 1638   BASOSABS 0.0 10/20/2015 1638   HEPATIC Function Panel  Recent Labs  05/25/15 1515 10/20/15 1638  PROT 7.9 8.2*   HEMOGLOBIN A1C No components found for: HGA1C,  MPG CARDIAC ENZYMES Lab Results  Component Value Date   CKTOTAL 84 03/08/2012   TROPONINI <0.03 08/03/2014   BNP No results for input(s): PROBNP in the last 8760 hours. TSH  Recent Labs  03/01/15 1430 05/26/15 1040  TSH 7.33* 1.166   CHOLESTEROL  Recent Labs  03/01/15 1430  CHOL 139    Scheduled Meds: . albuterol  2.5  mg Nebulization BID  . allopurinol  100 mg Oral Daily  . amLODipine  5 mg Oral QHS  . atorvastatin  40 mg Oral Daily  . clonazePAM  1 mg Oral BID  . colchicine  0.3 mg Oral Once per day on Mon Thu  . docusate sodium  100 mg Oral BID  . doxercalciferol  2 mcg Intravenous Q T,Th,Sa-HD  . DULoxetine  60 mg Oral Daily  . famotidine  20 mg Oral Daily  . heparin  5,000 Units Subcutaneous 3 times per day  . levothyroxine  175 mcg Oral QAC breakfast  . montelukast  10 mg Oral QHS  . multivitamin  1 tablet Oral QHS  . piperacillin-tazobactam (ZOSYN)  IV  2.25 g Intravenous 3 times per day  . pregabalin  50 mg Oral Daily  . QUEtiapine  100 mg Oral QHS  . sevelamer carbonate  3,200 mg Oral TID WC  . sodium chloride flush  3 mL Intravenous Q12H  . 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   LOS: 4 days    Dixie Dials  MD  10/24/2015, 6:12 PM

## 2015-10-25 ENCOUNTER — Inpatient Hospital Stay (HOSPITAL_COMMUNITY): Payer: Medicare Other

## 2015-10-25 ENCOUNTER — Encounter (HOSPITAL_COMMUNITY)
Admission: AD | Disposition: A | Discharge status: Skilled Nursing Facility | Payer: Self-pay | Source: Ambulatory Visit | Attending: Cardiovascular Disease

## 2015-10-25 ENCOUNTER — Encounter (HOSPITAL_COMMUNITY): Payer: Self-pay | Admitting: Pulmonary Disease

## 2015-10-25 DIAGNOSIS — J111 Influenza due to unidentified influenza virus with other respiratory manifestations: Secondary | ICD-10-CM

## 2015-10-25 HISTORY — PX: VIDEO BRONCHOSCOPY: SHX5072

## 2015-10-25 LAB — CBC
HCT: 34.1 % — ABNORMAL LOW (ref 36.0–46.0)
Hemoglobin: 11.5 g/dL — ABNORMAL LOW (ref 12.0–15.0)
MCH: 30.5 pg (ref 26.0–34.0)
MCHC: 33.7 g/dL (ref 30.0–36.0)
MCV: 90.5 fL (ref 78.0–100.0)
Platelets: 198 10*3/uL (ref 150–400)
RBC: 3.77 MIL/uL — ABNORMAL LOW (ref 3.87–5.11)
RDW: 16.2 % — ABNORMAL HIGH (ref 11.5–15.5)
WBC: 7.1 10*3/uL (ref 4.0–10.5)

## 2015-10-25 LAB — RENAL FUNCTION PANEL
Albumin: 2.7 g/dL — ABNORMAL LOW (ref 3.5–5.0)
Anion gap: 17 — ABNORMAL HIGH (ref 5–15)
BUN: 45 mg/dL — ABNORMAL HIGH (ref 6–20)
CO2: 22 mmol/L (ref 22–32)
Calcium: 8.5 mg/dL — ABNORMAL LOW (ref 8.9–10.3)
Chloride: 96 mmol/L — ABNORMAL LOW (ref 101–111)
Creatinine, Ser: 8.44 mg/dL — ABNORMAL HIGH (ref 0.44–1.00)
GFR calc Af Amer: 5 mL/min — ABNORMAL LOW (ref >60)
GFR calc non Af Amer: 4 mL/min — ABNORMAL LOW (ref >60)
Glucose, Bld: 172 mg/dL — ABNORMAL HIGH (ref 65–99)
Phosphorus: 5 mg/dL — ABNORMAL HIGH (ref 2.5–4.6)
Potassium: 4.6 mmol/L (ref 3.5–5.1)
Sodium: 135 mmol/L (ref 135–145)

## 2015-10-25 SURGERY — BRONCHOSCOPY, WITH FLUOROSCOPY
Anesthesia: Moderate Sedation | Laterality: Bilateral

## 2015-10-25 MED ORDER — PHENYLEPHRINE HCL 0.25 % NA SOLN: 1.0000 | Freq: Four times a day (QID) | NASAL | Status: DC | PRN

## 2015-10-25 MED ORDER — SODIUM CHLORIDE 0.9 % IV SOLN: 100.0000 mL | INTRAVENOUS | Status: DC | PRN

## 2015-10-25 MED ORDER — SODIUM CHLORIDE 0.9 % IV SOLN
INTRAVENOUS | Status: DC
  Administered 2015-10-25: 08:00:00 via INTRAVENOUS

## 2015-10-25 MED ORDER — DOXERCALCIFEROL 4 MCG/2ML IV SOLN
INTRAVENOUS | Status: AC
  Administered 2015-10-25: 2 ug via INTRAVENOUS
  Filled 2015-10-25: qty 2

## 2015-10-25 MED ORDER — VANCOMYCIN HCL IN DEXTROSE 750-5 MG/150ML-% IV SOLN
INTRAVENOUS | Status: AC
  Administered 2015-10-25: 750 mg via INTRAVENOUS
  Filled 2015-10-25: qty 150

## 2015-10-25 MED ORDER — BUTAMBEN-TETRACAINE-BENZOCAINE 2-2-14 % EX AERO: 1.0000 | INHALATION_SPRAY | Freq: Once | CUTANEOUS | Status: DC

## 2015-10-25 MED ORDER — PHENYLEPHRINE HCL 0.25 % NA SOLN
NASAL | Status: DC | PRN
  Administered 2015-10-25: 2 via NASAL

## 2015-10-25 MED ORDER — HEPARIN SODIUM (PORCINE) 1000 UNIT/ML DIALYSIS: 2200.0000 [IU] | Freq: Once | INTRAMUSCULAR | Status: DC

## 2015-10-25 MED ORDER — LIDOCAINE HCL 2 % EX GEL
CUTANEOUS | Status: DC | PRN
  Administered 2015-10-25: 1

## 2015-10-25 MED ORDER — LIDOCAINE HCL 2 % EX GEL: 1.0000 "application " | Freq: Once | CUTANEOUS | Status: DC

## 2015-10-25 MED ORDER — LIDOCAINE HCL (PF) 1 % IJ SOLN
INTRAMUSCULAR | Status: DC | PRN
  Administered 2015-10-25: 6 mL

## 2015-10-25 MED ORDER — FENTANYL CITRATE (PF) 100 MCG/2ML IJ SOLN
INTRAMUSCULAR | Status: AC
  Filled 2015-10-25: qty 4

## 2015-10-25 MED ORDER — SEVELAMER CARBONATE 800 MG PO TABS
3200.0000 mg | ORAL_TABLET | ORAL | Status: DC | PRN
  Administered 2015-10-25 – 2015-10-27 (×2): 3200 mg via ORAL
  Filled 2015-10-25: qty 4

## 2015-10-25 MED ORDER — MIDAZOLAM HCL 10 MG/2ML IJ SOLN
INTRAMUSCULAR | Status: DC | PRN
  Administered 2015-10-25 (×2): 1 mg via INTRAVENOUS

## 2015-10-25 MED ORDER — MIDAZOLAM HCL 5 MG/ML IJ SOLN
INTRAMUSCULAR | Status: AC
  Filled 2015-10-25: qty 2

## 2015-10-25 MED ORDER — FENTANYL CITRATE (PF) 100 MCG/2ML IJ SOLN
25.0000 ug | INTRAMUSCULAR | Status: DC | PRN
  Administered 2015-10-25 (×4): 25 ug via INTRAVENOUS

## 2015-10-25 MED ORDER — EPINEPHRINE HCL 0.1 MG/ML IJ SOSY
PREFILLED_SYRINGE | INTRAMUSCULAR | Status: DC | PRN
  Administered 2015-10-25: 5 mL via ENDOTRACHEOPULMONARY

## 2015-10-25 NOTE — Op Note (Signed)
[facility] Cardiopulmonary Patient Name: [name] [name] Pocedure Date: 10/25/2015 MRN: [id number] Attending MD: Kara Mead , MD Date of Birth: 02/18/1941 CSN: Finalized Age: 75 Admit Type: Inpatient Gender: Female Procedure:            Bronchoscopy Indications:          Right mainstem mass, Right upper lobe mass Providers:            Kara Mead, MD, Doris Cheadle RRT,RCP, Phillis Knack RRT,                        RCP Referring MD:          Medicines:            Lidocaine applied to nares and subglottic space,                        Fentanyl 100 mcg IV, Midazolam 2 mg IV Complications:        No immediate complications. Estimated blood loss:                        Minimal Estimated Blood Loss: Estimated blood loss was minimal. Procedure:            Pre-Anesthesia Assessment:                       - A History and Physical has been performed. Patient                        meds and allergies have been reviewed. The risks and                        benefits of the procedure and the sedation options and                        risks were discussed with the patient. All questions                        were answered and informed consent was obtained.                        Patient identification and proposed procedure were                        verified prior to the procedure by the physician.                        Mental Status Examination: normal. ASA Grade                        Assessment: II - A patient with mild systemic disease.                        After reviewing the risks and benefits, the patient was                        deemed in satisfactory condition to undergo the                        procedure. The anesthesia plan was to use moderate  sedation / analgesia (conscious sedation). Immediately                        prior to administration of medications, the patient was                        re-assessed for adequacy to receive  sedatives. The                        heart rate, respiratory rate, oxygen saturations, blood                        pressure, adequacy of pulmonary ventilation, and                        response to care were monitored throughout the                        procedure. The physical status of the patient was                        re-assessed after the procedure.                       After obtaining informed consent, the bronchoscope was                        passed under direct vision. Throughout the procedure,                        the patient's blood pressure, pulse, and oxygen                        saturations were monitored continuously. the [id number]                        [id number] scope was introduced through the right nostril                        and advanced to the tracheobronchial tree. The                        procedure was accomplished without difficulty. The                        patient tolerated the procedure fairly well. Scope In: Scope Out: Findings:      The nasopharynx/oropharynx appears normal. The larynx appears normal.       The vocal cords appear normal. The subglottic space is normal. The       trachea is of normal caliber. The carina is sharp. The tracheobronchial       tree of the left lung was examined to at least the first subsegmental       level. Bronchial mucosa and anatomy in the left lung are normal; there       are no endobronchial lesions, and no secretions.      Right Lung Abnormalities: A completely obstructing mass was found       distally in the right mainstem bronchus and in the right upper lobe. The       mass was large and exophytic and fungating.  The lesion was not       traversed. Endobronchial biopsies were performed in the right upper lobe       using and sent for histopathology examination. Four samples were       obtained. Brushings were obtained in the right upper lobe and sent for       routine cytology. Three samples were obtained.  BAL was performed in the       lung and sent for cell count, bacterial culture, viral smears & culture,       and fungal & AFB analysis and cytology. The return was bloody. There       were no mucoid plugs in the return fluid. Multiple specimens were       obtained, and each sent for analysis. Impression:           - Right mainstem mass                       - Right upper lobe mass                       - The left lung was normal.                       - An exophytic and fungating mass was found in the                        right mainstem bronchus and in the right upper lobe.                       - An endobronchial biopsy was performed.                       - Brushings were obtained.                       - Bronchoalveolar lavage was performed. Moderate Sedation:      Moderate (conscious) sedation was personally administered by the       endoscopist. The following parameters were monitored: oxygen saturation,       heart rate, blood pressure, and response to care. Total physician       intraservice time was 22 minutes. Recommendation:       - Await BAL, biopsy and brushing results. Procedure Code(s):    --- Professional ---                       [phone], Bronchoscopy, rigid or flexible, including                        fluoroscopic guidance, when performed; with bronchial                        or endobronchial biopsy(s), single or multiple sites                       63785, Bronchoscopy, rigid or flexible, including                        fluoroscopic guidance, when performed; with brushing or                        protected brushings  [id number], Moderate sedation services provided by the same                        physician or other qualified health care professional                        performing the diagnostic or therapeutic service that                        the sedation supports, requiring the presence of an                        independent trained  observer to assist in the                        monitoring of the patient's level of consciousness and                        physiological status; initial 15 minutes of                        intraservice time, patient age 49 years or older Diagnosis Code(s):    --- Professional ---                       R91.8, Other nonspecific abnormal finding of lung field                       J98.9, Respiratory disorder, unspecified CPT copyright 2016 American Medical Association. All rights reserved. The codes documented in this report are preliminary and upon coder review may  be revised to meet current compliance requirements. Kara Mead, MD Kara Mead, MD 10/25/2015 8:47:49 AM This report has been signed electronically. Number of Addenda: 0

## 2015-10-25 NOTE — Procedures (Signed)
Had bronch today, fungating mass in RUL per report.  No complaints post procedure, on HD. Stable.  NO change in exam. Up 4 kg by wts.  K ok. HD today.   I was present at this dialysis session, have reviewed the session itself and made  appropriate changes Kelly Splinter MD [place] pager [phone]    cell [phone] 10/25/2015, 1:00 PM

## 2015-10-25 NOTE — Consult Note (Signed)
   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 Cxr was suspicious for RUL lung mass and CT of the chest was performed with findings of Rt apical 4.4 cm mass, Rt hilar and subcarinal lymphadenopathy, rt mainstem occulusion, LUL 5 mm nodule. PCCM was contacted per Dr. Cleon Dew for possible FOB and tissue dx. CT reveals adequate tissue for FOB.    SUBJECTIVE:  Breathing ok , 'little tight' No cough  VITAL SIGNS: Temp:  [98 F (36.7 C)-98.7 F (37.1 C)] 98.4 F (36.9 C) (03/21 0724) Pulse Rate:  [77-109] 102 (03/21 0845) Resp:  [8-25] 14 (03/21 0845) BP: (124-194)/(56-95) 137/64 mmHg (03/21 0845) SpO2:  [90 %-100 %] 97 % (03/21 0845) Weight:  [141 lb (63.957 kg)-141 lb 5 oz (64.1 kg)] 141 lb (63.957 kg) (03/21 0724)  PHYSICAL EXAMINATION: General:  NWDAAF Neuro:  Intact HEENT: No JVD/LAN Cardiovascular: HSR RRR Lungs:  No rhonchi bilat Abdomen:+bs Musculoskeletal: Intact Skin: warm and dry   Recent Labs Lab 10/21/15 1117 10/22/15 1107 10/23/15 0523  NA 137 133* 136  K 5.4* 5.1 4.3  CL 94* 92* 95*  CO2 '27 23 27  '$ BUN 18 38* 16  CREATININE 4.97* 7.26* 4.59*  GLUCOSE 81 130* 104*    Recent Labs Lab 10/20/15 1638 10/20/15 2200 10/22/15 1107  HGB 13.5 12.3 11.4*  HCT 43.2 38.7 35.7*  WBC 9.7 8.4 7.8  PLT 220 227 187   No results found.  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 Cxr was suspicious for RUL lung mass and CT of the chest was performed with findings of Rt apical 4.4 cm mass, Rt hilar and subcarinal lymphadenopathy, rt mainstem occulusion, LUL 5 mm nodule. PCCM was contacted per Dr. Cleon Dew for possible FOB and tissue dx.     PLAN: Bronchsocopy today She will need PEt scan as outpt & oncology referral Await results of procedure - fungating mass seen obstructing RUL orifice - brushings & biopsies taken  Kara Mead MD. FCCP. [place] [facility] Pager [phone] If no response call [phone]   10/25/2015  10/25/2015, 8:50 AM    .

## 2015-10-25 NOTE — Progress Notes (Signed)
Ref: Cathlean Cower, MD   Subjective:  Had Bronchoscopy with biopsy followed by hemodialysis done earlier in the day. Afebrile. Decreasing cough.  Objective:  Vital Signs in the last 24 hours: Temp:  [97.8 F (36.6 C)-99 F (37.2 C)] 98.8 F (37.1 C) (03/21 2049) Pulse Rate:  [78-109] 96 (03/21 2049) Cardiac Rhythm:  [-] Normal sinus rhythm (03/21 1901) Resp:  [8-25] 18 (03/21 2049) BP: (124-194)/(56-101) 146/71 mmHg (03/21 2049) SpO2:  [90 %-100 %] 94 % (03/21 2106) Weight:  [63.957 kg (141 lb)-73.4 kg (161 lb 13.1 oz)] 70.4 kg (155 lb 3.3 oz) (03/21 1620)  Physical Exam: BP Readings from Last 1 Encounters:  10/25/15 146/71    Wt Readings from Last 1 Encounters:  10/25/15 70.4 kg (155 lb 3.3 oz)    Weight change: -0.1 kg (-3.5 oz)  HEENT: [place]/AT, Eyes-Brown, PERL, EOMI, Conjunctiva-Pink, Sclera-Non-icteric Neck: No JVD, No bruit, Trachea midline. Lungs:  Clearing, Bilateral. Cardiac:  Regular rhythm, normal S1 and S2, no S3. II/VI systolic murmur. Abdomen:  Soft, non-tender. Extremities:  No edema present. No cyanosis. No clubbing. Right upper arm AVF. CNS: AxOx3, Cranial nerves grossly intact, moves all 4 extremities. Right handed. Skin: Warm and dry.   Intake/Output from previous day: 03/20 0701 - 03/21 0700 In: 750 [P.O.:600; IV Piggyback:150] Out: 0     Lab Results: BMET    Component Value Date/Time   NA 135 10/25/2015 1235   NA 136 10/23/2015 0523   NA 133* 10/22/2015 1107   K 4.6 10/25/2015 1235   K 4.3 10/23/2015 0523   K 5.1 10/22/2015 1107   CL 96* 10/25/2015 1235   CL 95* 10/23/2015 0523   CL 92* 10/22/2015 1107   CO2 22 10/25/2015 1235   CO2 27 10/23/2015 0523   CO2 23 10/22/2015 1107   GLUCOSE 172* 10/25/2015 1235   GLUCOSE 104* 10/23/2015 0523   GLUCOSE 130* 10/22/2015 1107   BUN 45* 10/25/2015 1235   BUN 16 10/23/2015 0523   BUN 38* 10/22/2015 1107   CREATININE 8.44* 10/25/2015 1235   CREATININE 4.59* 10/23/2015 0523   CREATININE 7.26*  10/22/2015 1107   CALCIUM 8.5* 10/25/2015 1235   CALCIUM 8.7* 10/23/2015 0523   CALCIUM 8.5* 10/22/2015 1107   GFRNONAA 4* 10/25/2015 1235   GFRNONAA 9* 10/23/2015 0523   GFRNONAA 5* 10/22/2015 1107   GFRAA 5* 10/25/2015 1235   GFRAA 10* 10/23/2015 0523   GFRAA 6* 10/22/2015 1107   CBC    Component Value Date/Time   WBC 7.1 10/25/2015 1236   RBC 3.77* 10/25/2015 1236   RBC 3.08* 03/09/2012 0500   HGB 11.5* 10/25/2015 1236   HCT 34.1* 10/25/2015 1236   PLT 198 10/25/2015 1236   MCV 90.5 10/25/2015 1236   MCH 30.5 10/25/2015 1236   MCHC 33.7 10/25/2015 1236   RDW 16.2* 10/25/2015 1236   LYMPHSABS 1.1 10/20/2015 1638   MONOABS 0.6 10/20/2015 1638   EOSABS 0.1 10/20/2015 1638   BASOSABS 0.0 10/20/2015 1638   HEPATIC Function Panel  Recent Labs  05/25/15 1515 10/20/15 1638  PROT 7.9 8.2*   HEMOGLOBIN A1C No components found for: HGA1C,  MPG CARDIAC ENZYMES Lab Results  Component Value Date   CKTOTAL 84 03/08/2012   TROPONINI <0.03 08/03/2014   BNP No results for input(s): PROBNP in the last 8760 hours. TSH  Recent Labs  03/01/15 1430 05/26/15 1040  TSH 7.33* 1.166   CHOLESTEROL  Recent Labs  03/01/15 1430  CHOL 139    Scheduled  Meds: . albuterol  2.5 mg Nebulization BID  . allopurinol  100 mg Oral Daily  . amLODipine  5 mg Oral QHS  . atorvastatin  40 mg Oral Daily  . clonazePAM  1 mg Oral BID  . colchicine  0.3 mg Oral Once per day on Mon Thu  . docusate sodium  100 mg Oral BID  . doxercalciferol  2 mcg Intravenous Q T,Th,Sa-HD  . DULoxetine  60 mg Oral Daily  . famotidine  20 mg Oral Daily  . heparin  5,000 Units Subcutaneous 3 times per day  . levothyroxine  175 mcg Oral QAC breakfast  . montelukast  10 mg Oral QHS  . multivitamin  1 tablet Oral QHS  . piperacillin-tazobactam (ZOSYN)  IV  2.25 g Intravenous 3 times per day  . pregabalin  50 mg Oral Daily  . QUEtiapine  100 mg Oral QHS  . sevelamer carbonate  3,200 mg Oral TID WC  .  sodium chloride flush  3 mL Intravenous Q12H  . 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    LOS: 5 days    Dixie Dials  MD  10/25/2015, 10:35 PM

## 2015-10-25 NOTE — Progress Notes (Signed)
Pharmacy Antibiotic Note  Nerida L Diemer is a 75 y.o. female admitted on 10/20/2015 with pneumonia.  Pharmacy has been consulted for vanc/zosyn dosing.  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 Bronchoscopy today confirmed lung mass of RUL and cultures were collected.  Pt has listed rash allergy to vanc/cephalosporins but has received these antibiotics in the past since the documented allergy with no documented reactions.   Plan: - Continue Vanc 750 mg IV qHD-TTS - Continue Zosyn 2.25g IV q8h - Monitor HD schedule, cx, abx LOT  Height: '5\' 5"'$  (165.1 cm) Weight: 161 lb 13.1 oz (73.4 kg) IBW/kg (Calculated) : 57  Temp (24hrs), Avg:98.3 F (36.8 C), Min:98 F (36.7 C), Max:98.7 F (37.1 C)   Recent Labs Lab 10/20/15 1638 10/20/15 2200 10/21/15 0209 10/21/15 1117 10/22/15 1107 10/23/15 0523 10/25/15 1235 10/25/15 1236  WBC 9.7 8.4  --   --  7.8  --   --  7.1  CREATININE 9.47*  --  5.16* 4.97* 7.26* 4.59* 8.44*  --     Estimated Creatinine Clearance: 5.8 mL/min (by C-G formula based on Cr of 8.44).    Allergies  Allergen Reactions  . Cephalosporins Itching and Rash    Vanc and fortaz given at the same time in June for several doses at dialysis with systemic rash and itching; received zinacef 7/5 and had worseningsystemicrash/ itching and swelling of eyes - so unclear if allergic to either or both-  Pt denies this  . Nsaids     REACTION: renal dysfunction  . Pioglitazone Swelling    Actos REACTION: EDEMA  . Vancomycin Rash    See comment under cephalosporin    Antimicrobials this admission: Tamiflu 3/16 >> 3/20 vanc 3/18 >>  Zosyn 3/18>>  Dose adjustments this admission: n/a  Microbiology results: 3/18 BCx: no growth x 2 days 3/21 Sputum/BAL: sent  3/16 MRSA PCR: negative 3/16 influenza A positive  Thank you for allowing  pharmacy to be a part of this patient's care.  Joya San, PharmD Clinical Pharmacy Resident Pager # [phone] 10/25/2015 2:07 PM

## 2015-10-25 NOTE — Progress Notes (Signed)
Video bronchoscopy performed.  Intervention bronchial washing. Intervention bronchial biopsy. Intervention bronchial brushing.  No complications noted.  Will continue to monitor. 

## 2015-10-25 NOTE — H&P (View-Only) (Signed)
   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 Cxr was suspicious for RUL lung mass and CT of the chest was performed with findings of Rt apical 4.4 cm mass, Rt hilar and subcarinal lymphadenopathy, rt mainstem occulusion, LUL 5 mm nodule. PCCM was contacted per Dr. Cleon Dew for possible FOB and tissue dx. CT reveals adequate tissue for FOB.    SUBJECTIVE:  Breathing better No cough  VITAL SIGNS: Temp:  [98 F (36.7 C)-99 F (37.2 C)] 98 F (36.7 C) (03/20 1000) Pulse Rate:  [77-88] 77 (03/20 1000) Resp:  [18-20] 20 (03/20 1000) BP: (121-133)/(62-71) 125/63 mmHg (03/20 1000) SpO2:  [91 %-98 %] 98 % (03/20 1000) Weight:  [141 lb 8.6 oz (64.2 kg)] 141 lb 8.6 oz (64.2 kg) (03/19 2017)  PHYSICAL EXAMINATION: General:  NWDAAF Neuro:  Intact HEENT: No JVD/LAN Cardiovascular: HSR RRR Lungs:  decreased rhonchi bilat Abdomen:+bs Musculoskeletal: Intact Skin: warm and dry   Recent Labs Lab 10/21/15 1117 10/22/15 1107 10/23/15 0523  NA 137 133* 136  K 5.4* 5.1 4.3  CL 94* 92* 95*  CO2 '27 23 27  '$ BUN 18 38* 16  CREATININE 4.97* 7.26* 4.59*  GLUCOSE 81 130* 104*    Recent Labs Lab 10/20/15 1638 10/20/15 2200 10/22/15 1107  HGB 13.5 12.3 11.4*  HCT 43.2 38.7 35.7*  WBC 9.7 8.4 7.8  PLT 220 227 187   No results found.  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 Cxr was suspicious for RUL lung mass and CT of the chest was performed with findings of Rt apical 4.4 cm mass, Rt hilar and subcarinal lymphadenopathy, rt mainstem occulusion, LUL 5 mm nodule. PCCM was contacted per Dr. Cleon Dew for possible FOB and tissue dx. She may also have a RUL bronchus endobronchial lesion. I believe she will need FOB to biopsy the proximal lesion, may also need to consider transthoracic needle biopsy to get the more peripheral lesion depending on how reachable it is by FOB    PLAN: Bronchsocopy arranged for 3/21 0800 Npo after MN She will need PEt scan as outpt  Kara Mead MD. [facility]. [place] [facility] Pager [phone] If no response call [phone]   10/24/2015  10/24/2015, 12:44 PM    .

## 2015-10-25 NOTE — Interval H&P Note (Signed)
History and Physical Interval Note:  10/25/2015 8:04 AM  [name] [name]  has presented today for surgery, with the diagnosis of lung mass  The various methods of treatment have been discussed with the patient and family. After consideration of risks, benefits and other options for treatment, the patient has consented to  Procedure(s): VIDEO BRONCHOSCOPY WITH FLUORO (Bilateral) as a surgical intervention .  The patient's history has been reviewed, patient examined, no change in status, stable for surgery.  I have reviewed the patient's chart and labs.  Questions were answered to the patient's satisfaction.     ALVA,RAKESH V.

## 2015-10-26 DIAGNOSIS — C3491 Malignant neoplasm of unspecified part of right bronchus or lung: Secondary | ICD-10-CM

## 2015-10-26 DIAGNOSIS — C3490 Malignant neoplasm of unspecified part of unspecified bronchus or lung: Secondary | ICD-10-CM

## 2015-10-26 LAB — ACID FAST SMEAR (AFB)

## 2015-10-26 LAB — ACID FAST SMEAR (AFB, MYCOBACTERIA): Acid Fast Smear: NEGATIVE

## 2015-10-26 NOTE — Care Management Important Message (Signed)
Important Message  Patient Details  Name: [name] [name] MRN: [id number] Date of Birth: February 10, 1941   Medicare Important Message Given:  Yes    Breane Grunwald, Rory Percy, RN 10/26/2015, 10:50 AM

## 2015-10-26 NOTE — Progress Notes (Signed)
[facility] Progress Note  Assessment: 1. Influenza A: Improving, on Tamiflu. Tmax 99.0 WBC 7.8. On vanc/ zosyn. Fevers resolved.  2. 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 ESRD - TTS HD 3. Anemia - HGB 11.4. No OP ESA. Follow HGB 4. Metabolic Bone Disease/Secondary hyperparathyroidism -continue hectoral/Renvela. Last Ca 8.7 C Ca 9.5 phos 5.2 5. HTN/volume - up 1-2kg 6. Nutrition - Albumin 3.0. Renal diet, renal vit. Supplement.  7. COPD exacerbation: per primary 8. Dispo - per primary, stable from renal standpoint    Plan - as above  Kelly Splinter MD [place] pager [phone]    cell [phone] 10/26/2015, 11:36 AM    Subjective:   Objective Filed Vitals:   10/25/15 2049 10/25/15 2106 10/26/15 0648 10/26/15 1000  BP: 146/71  126/57 142/68  Pulse: 96  90 91  Temp: 98.8 F (37.1 C)  98.7 F (37.1 C) 98.2 F (36.8 C)  TempSrc: Oral  Oral Oral  Resp: '18  18 18  '$ Height:      Weight:      SpO2: 96% 94% 98% 96%   Physical Exam General: alert sitting up in bed Heart: RRR II/VI Systolic M. No JVD.  Lungs: Bilateral breath sounds, few scattered inspiratory wheezes, slightly decin bases Abdomen:soft non-tender, active BS Extremities: no LE edema Dialysis Access: RUA AVF + bruit  Dialysis Orders: TTS  3h 25mn   68.5kg   2/2.5 bath  Prof 4  RUA AVF   Hep 2200 Hectoral: 2 mcg IV Q tx TTS Venofer: 50 mg IV weekly (being restarted after Fe load T Sat 23% 09/30/15)  Additional Objective Labs: Basic Metabolic Panel:  Recent Labs Lab 10/21/15 0209  10/22/15 1107 10/23/15 0523 10/25/15 1235  NA 135  < > 133* 136 135  K 3.7  < > 5.1 4.3 4.6  CL 94*  < > 92* 95* 96*  CO2 25  < > '23 27 22  '$ GLUCOSE 96  < > 130* 104* 172*  BUN 31*  < > 38* 16 45*  CREATININE 5.16*  < > 7.26* 4.59* 8.44*  CALCIUM 8.4*  < > 8.5* 8.7* 8.5*  PHOS 3.2  --  5.2*  --  5.0*  < > = values in this interval not displayed. Liver  Function Tests:  Recent Labs Lab 10/20/15 1638 10/21/15 0209 10/22/15 1107 10/25/15 1235  AST 35  --   --   --   ALT 27  --   --   --   ALKPHOS 97  --   --   --   BILITOT 1.8*  --   --   --   PROT 8.2*  --   --   --   ALBUMIN 3.9 3.1* 3.0* 2.7*   No results for input(s): LIPASE, AMYLASE in the last 168 hours. CBC:  Recent Labs Lab 10/20/15 1638 10/20/15 2200 10/22/15 1107 10/25/15 1236  WBC 9.7 8.4 7.8 7.1  NEUTROABS 7.9*  --   --   --   HGB 13.5 12.3 11.4* 11.5*  HCT 43.2 38.7 35.7* 34.1*  MCV 95.4 93.3 93.2 90.5  PLT 220 227 187 198   Blood Culture    Component Value Date/Time   SDES BRONCHIAL ALVEOLAR LAVAGE 10/25/2015 0830   SPECREQUEST NONE 10/25/2015 0830   CULT PENDING 10/25/2015 0830   REPTSTATUS PENDING 10/25/2015 0830    Cardiac Enzymes: No results for input(s): CKTOTAL, CKMB, CKMBINDEX, TROPONINI in the  last 168 hours. CBG: No results for input(s): GLUCAP in the last 168 hours. Iron Studies: No results for input(s): IRON, TIBC, TRANSFERRIN, FERRITIN in the last 72 hours. '@lablastinr3'$ @ Studies/Results: No results found. Medications:   . albuterol  2.5 mg Nebulization BID  . allopurinol  100 mg Oral Daily  . amLODipine  5 mg Oral QHS  . atorvastatin  40 mg Oral Daily  . clonazePAM  1 mg Oral BID  . colchicine  0.3 mg Oral Once per day on Mon Thu  . docusate sodium  100 mg Oral BID  . doxercalciferol  2 mcg Intravenous Q T,Th,Sa-HD  . DULoxetine  60 mg Oral Daily  . famotidine  20 mg Oral Daily  . heparin  5,000 Units Subcutaneous 3 times per day  . levothyroxine  175 mcg Oral QAC breakfast  . montelukast  10 mg Oral QHS  . multivitamin  1 tablet Oral QHS  . piperacillin-tazobactam (ZOSYN)  IV  2.25 g Intravenous 3 times per day  . pregabalin  50 mg Oral Daily  . QUEtiapine  100 mg Oral QHS  . sevelamer carbonate  3,200 mg Oral TID WC  . sodium chloride flush  3 mL Intravenous Q12H  . vancomycin  750 mg Intravenous Q T,Th,Sa-HD

## 2015-10-26 NOTE — Progress Notes (Signed)
Ref: Cathlean Cower, MD   Subjective:  Feeling stable. Afebrile.  Objective:  Vital Signs in the last 24 hours: Temp:  [98.2 F (36.8 C)-98.8 F (37.1 C)] 98.6 F (37 C) (03/22 2008) Pulse Rate:  [83-98] 83 (03/22 2008) Cardiac Rhythm:  [-] Normal sinus rhythm (03/22 0700) Resp:  [18-19] 19 (03/22 2008) BP: (126-146)/(57-71) 132/59 mmHg (03/22 2008) SpO2:  [94 %-99 %] 99 % (03/22 2008) Weight:  [70.2 kg (154 lb 12.2 oz)] 70.2 kg (154 lb 12.2 oz) (03/22 2008)  Physical Exam: BP Readings from Last 1 Encounters:  10/26/15 132/59    Wt Readings from Last 1 Encounters:  10/26/15 70.2 kg (154 lb 12.2 oz)    Weight change: -0.143 kg (-5 oz)  HEENT: [place]/AT, Eyes-Brown, PERL, EOMI, Conjunctiva-Pink, Sclera-Non-icteric Neck: No JVD, No bruit, Trachea midline. Lungs:  Clearing, Bilateral. Cardiac:  Regular rhythm, normal S1 and S2, no S3. II/VI systolic murmur. Abdomen:  Soft, non-tender. Extremities:  No edema present. No cyanosis. No clubbing.AVF in right upper arm. CNS: AxOx3, Cranial nerves grossly intact, moves all 4 extremities. Right handed. Skin: Warm and dry.   Intake/Output from previous day: 03/21 0701 - 03/22 0700 In: 520 [P.O.:360; I.V.:60; IV Piggyback:100] Out: 3002     Lab Results: BMET    Component Value Date/Time   NA 135 10/25/2015 1235   NA 136 10/23/2015 0523   NA 133* 10/22/2015 1107   K 4.6 10/25/2015 1235   K 4.3 10/23/2015 0523   K 5.1 10/22/2015 1107   CL 96* 10/25/2015 1235   CL 95* 10/23/2015 0523   CL 92* 10/22/2015 1107   CO2 22 10/25/2015 1235   CO2 27 10/23/2015 0523   CO2 23 10/22/2015 1107   GLUCOSE 172* 10/25/2015 1235   GLUCOSE 104* 10/23/2015 0523   GLUCOSE 130* 10/22/2015 1107   BUN 45* 10/25/2015 1235   BUN 16 10/23/2015 0523   BUN 38* 10/22/2015 1107   CREATININE 8.44* 10/25/2015 1235   CREATININE 4.59* 10/23/2015 0523   CREATININE 7.26* 10/22/2015 1107   CALCIUM 8.5* 10/25/2015 1235   CALCIUM 8.7* 10/23/2015 0523   CALCIUM  8.5* 10/22/2015 1107   GFRNONAA 4* 10/25/2015 1235   GFRNONAA 9* 10/23/2015 0523   GFRNONAA 5* 10/22/2015 1107   GFRAA 5* 10/25/2015 1235   GFRAA 10* 10/23/2015 0523   GFRAA 6* 10/22/2015 1107   CBC    Component Value Date/Time   WBC 7.1 10/25/2015 1236   RBC 3.77* 10/25/2015 1236   RBC 3.08* 03/09/2012 0500   HGB 11.5* 10/25/2015 1236   HCT 34.1* 10/25/2015 1236   PLT 198 10/25/2015 1236   MCV 90.5 10/25/2015 1236   MCH 30.5 10/25/2015 1236   MCHC 33.7 10/25/2015 1236   RDW 16.2* 10/25/2015 1236   LYMPHSABS 1.1 10/20/2015 1638   MONOABS 0.6 10/20/2015 1638   EOSABS 0.1 10/20/2015 1638   BASOSABS 0.0 10/20/2015 1638   HEPATIC Function Panel  Recent Labs  05/25/15 1515 10/20/15 1638  PROT 7.9 8.2*   HEMOGLOBIN A1C No components found for: HGA1C,  MPG CARDIAC ENZYMES Lab Results  Component Value Date   CKTOTAL 84 03/08/2012   TROPONINI <0.03 08/03/2014   BNP No results for input(s): PROBNP in the last 8760 hours. TSH  Recent Labs  03/01/15 1430 05/26/15 1040  TSH 7.33* 1.166   CHOLESTEROL  Recent Labs  03/01/15 1430  CHOL 139    Scheduled Meds: . albuterol  2.5 mg Nebulization BID  . allopurinol  100 mg  Oral Daily  . amLODipine  5 mg Oral QHS  . atorvastatin  40 mg Oral Daily  . clonazePAM  1 mg Oral BID  . colchicine  0.3 mg Oral Once per day on Mon Thu  . docusate sodium  100 mg Oral BID  . doxercalciferol  2 mcg Intravenous Q T,Th,Sa-HD  . DULoxetine  60 mg Oral Daily  . famotidine  20 mg Oral Daily  . heparin  5,000 Units Subcutaneous 3 times per day  . levothyroxine  175 mcg Oral QAC breakfast  . montelukast  10 mg Oral QHS  . multivitamin  1 tablet Oral QHS  . piperacillin-tazobactam (ZOSYN)  IV  2.25 g Intravenous 3 times per day  . pregabalin  50 mg Oral Daily  . QUEtiapine  100 mg Oral QHS  . sevelamer carbonate  3,200 mg Oral TID WC  . sodium chloride flush  3 mL Intravenous Q12H  . 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 Patient understood possible chemotherapy and radiation therapy and is willing to undergo PET scan and treatment.   LOS: 6 days    Dixie Dials  MD  10/26/2015, 8:45 PM

## 2015-10-26 NOTE — H&P (Addendum)
Referring Physician:  Rada [name] [name] is an 75 y.o. female.                       Chief Complaint: Cough and shortness of breath.  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 Chest X-ray suggestive of right upper lung mass.   SUBJECTIVE/OVERNIGHT/INTERVAL HX 3/22/1 - s/p bronch yesterday - bx reports show squamous cell carcinoma. She denies active complaitns   Blood pressure 142/68, pulse 91, temperature 98.2 F (36.8 C), temperature source Oral, resp. rate 18, height '5\' 5"'$  (1.651 m), weight 70.4 kg (155 lb 3.3 oz), SpO2 96 %.   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 Brand Males, M.D., [facility].C.P Pulmonary and Critical Care Medicine Staff Physician [facility] Pager: [phone], If no answer or between  15:00h - 7:00h: call [phone]  10/26/2015 11:03 AM

## 2015-10-27 LAB — BASIC METABOLIC PANEL
BUN: 28 mg/dL — AB (ref 4–21)
CREATININE: 7.4 mg/dL — AB (ref 0.5–1.1)
GLUCOSE: 76 mg/dL
Sodium: 139 mmol/L (ref 137–147)

## 2015-10-27 LAB — RENAL FUNCTION PANEL
ALBUMIN: 2.7 g/dL — AB (ref 3.5–5.0)
ANION GAP: 15 (ref 5–15)
BUN: 28 mg/dL — ABNORMAL HIGH (ref 6–20)
CALCIUM: 8.7 mg/dL — AB (ref 8.9–10.3)
CO2: 27 mmol/L (ref 22–32)
CREATININE: 7.4 mg/dL — AB (ref 0.44–1.00)
Chloride: 97 mmol/L — ABNORMAL LOW (ref 101–111)
GFR calc non Af Amer: 5 mL/min — ABNORMAL LOW (ref >60)
GFR, EST AFRICAN AMERICAN: 6 mL/min — AB (ref >60)
GLUCOSE: 76 mg/dL (ref 65–99)
PHOSPHORUS: 5.2 mg/dL — AB (ref 2.5–4.6)
Potassium: 4.6 mmol/L (ref 3.5–5.1)
SODIUM: 139 mmol/L (ref 135–145)

## 2015-10-27 LAB — CBC
HCT: 33.6 % — ABNORMAL LOW (ref 36.0–46.0)
HEMOGLOBIN: 11.1 g/dL — AB (ref 12.0–15.0)
MCH: 30.2 pg (ref 26.0–34.0)
MCHC: 33 g/dL (ref 30.0–36.0)
MCV: 91.6 fL (ref 78.0–100.0)
Platelets: 220 10*3/uL (ref 150–400)
RBC: 3.67 MIL/uL — ABNORMAL LOW (ref 3.87–5.11)
RDW: 16.3 % — AB (ref 11.5–15.5)
WBC: 5.2 10*3/uL (ref 4.0–10.5)

## 2015-10-27 LAB — CULTURE, BLOOD (ROUTINE X 2)
Culture: NO GROWTH
Culture: NO GROWTH

## 2015-10-27 LAB — CULTURE, BAL-QUANTITATIVE W GRAM STAIN: Colony Count: 8000

## 2015-10-27 LAB — CULTURE, BAL-QUANTITATIVE

## 2015-10-27 LAB — CBC AND DIFFERENTIAL: WBC: 5.2 10*3/mL

## 2015-10-27 MED ORDER — HEPARIN SODIUM (PORCINE) 1000 UNIT/ML DIALYSIS: 1000.0000 [IU] | INTRAMUSCULAR | Status: DC | PRN

## 2015-10-27 MED ORDER — SODIUM CHLORIDE 0.9 % IV SOLN: 100.0000 mL | INTRAVENOUS | Status: DC | PRN

## 2015-10-27 MED ORDER — ALTEPLASE 2 MG IJ SOLR: 2.0000 mg | Freq: Once | INTRAMUSCULAR | Status: DC | PRN

## 2015-10-27 MED ORDER — PENTAFLUOROPROP-TETRAFLUOROETH EX AERO: 1.0000 "application " | INHALATION_SPRAY | CUTANEOUS | Status: DC | PRN

## 2015-10-27 MED ORDER — HEPARIN SODIUM (PORCINE) 1000 UNIT/ML DIALYSIS: 2000.0000 [IU] | Freq: Once | INTRAMUSCULAR | Status: DC

## 2015-10-27 MED ORDER — VANCOMYCIN HCL IN DEXTROSE 750-5 MG/150ML-% IV SOLN
INTRAVENOUS | Status: AC
  Administered 2015-10-27: 750 mg via INTRAVENOUS
  Filled 2015-10-27: qty 150

## 2015-10-27 MED ORDER — LIDOCAINE-PRILOCAINE 2.5-2.5 % EX CREA: 1.0000 "application " | TOPICAL_CREAM | CUTANEOUS | Status: DC | PRN

## 2015-10-27 MED ORDER — LIDOCAINE HCL (PF) 1 % IJ SOLN: 5.0000 mL | INTRAMUSCULAR | Status: DC | PRN

## 2015-10-27 MED ORDER — DOXERCALCIFEROL 4 MCG/2ML IV SOLN
INTRAVENOUS | Status: AC
  Administered 2015-10-27: 2 ug via INTRAVENOUS
  Filled 2015-10-27: qty 2

## 2015-10-27 NOTE — NC FL2 (Signed)
[place] MEDICAID FL2 LEVEL OF CARE SCREENING TOOL     IDENTIFICATION  Patient Name: [name] [name] Birthdate: 16-Apr-1941 Sex: female Admission Date (Current Location): 10/20/2015  [facility] and [place] Number:  Herbalist and Address:  [facility], [place] [address], [place], [place] [address]      Provider Number: [id number]  Attending Physician Name and Address:  Dixie Dials, MD  Relative Name and Phone Number:       Current Level of Care: Hospital Recommended Level of Care: [place] Prior Approval Number:    Date Approved/Denied:   PASRR Number: [id number] (Eff. 03/12/12)  Discharge Plan: SNF    Current Diagnoses: Patient Active Problem List   Diagnosis Date Noted  . Squamous cell lung cancer ([place]) 10/26/2015  . Lung mass 10/22/2015  . Acute exacerbation of COPD with asthma ([place]) 10/20/2015  . Cough 06/15/2015  . Syncope 05/25/2015    Class: Acute  . Thrombocytopenia ([place]) 08/04/2014  . Acute encephalopathy 08/03/2014  . Acute bronchitis 05/26/2014  . Non-compliant behavior 05/20/2014  . Pain of right upper arm 04/30/2014  . Other complications due to renal dialysis device, implant, and graft 04/30/2014  . Hyperparathyroidism, secondary renal ([place]) 04/12/2014  . Hyperkalemia 03/30/2014  . Hypoglycemia 03/30/2014  . ESRD on hemodialysis ([place]) 03/30/2014  . Anemia of renal disease 03/30/2014  . Peripheral neuropathy ([place]) 12/12/2013  . Hives 02/09/2013  . Thinning of skin 02/04/2013  . 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 Unspecified constipation 01/02/2013  . Muscle spasm 01/02/2013  . COPD (chronic obstructive pulmonary disease) ([place]) 01/02/2013  . Cancer of kidney ([place]) 10/07/2012  . Insomnia 10/07/2012  . Dysuria 01/24/2012  . N&V (nausea and vomiting) 01/24/2012  . Generalized weakness 08/17/2011  . Gait instability 08/17/2011  . UTI (lower urinary tract infection) 08/17/2011  .  Renal failure (ARF), acute on chronic (HCC) 08/17/2011  . Bilateral knee pain 04/20/2011  . 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 Chronic neck pain 12/13/2010  . Osteopenia 12/13/2010  . Preventative health care 12/12/2010  . HEART MURMUR, HX OF 10/02/2010  . Palpitations 09/08/2010  . HYPERSOMNIA 05/26/2010  . Anxiety state 03/23/2010  . Memory loss 01/24/2010  . Backache 12/12/2009  . Personal history of colonic polyps 11/16/2009  . MENOPAUSAL DISORDER 05/25/2009  . SHOULDER PAIN, LEFT, CHRONIC 09/15/2008  . CERVICAL RADICULOPATHY, LEFT 05/27/2008  . Osteoarth NOS-L/Leg 03/11/2008  . 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 FATIGUE 09/17/2007  . Abnormality of gait 07/30/2007  . Gout 05/29/2007  . RESTLESS LEG SYNDROME 05/29/2007  . COMMON MIGRAINE 05/29/2007  . PERIPHERAL NEUROPATHY 05/29/2007  . Asthmatic bronchitis , chronic ([place]) 05/29/2007  . Peptic ulcer 05/29/2007  . Hypothyroidism 02/17/2007  . Impaired glucose tolerance 02/17/2007  . HLD (hyperlipidemia) 02/17/2007  . DEPRESSION 02/17/2007  . Essential hypertension 02/17/2007  . Peripheral vascular disease ([place]) 02/17/2007  . ALLERGIC RHINITIS 02/17/2007  . GERD 02/17/2007  . Seizure ([place]) 02/17/2007    Orientation RESPIRATION BLADDER Height & Weight     Self, Time, Situation, Place  Normal Continent Weight: 149 lb 11.1 oz (67.9 kg) (Standing scale) Height:  '5\' 5"'$  (165.1 cm)  BEHAVIORAL SYMPTOMS/MOOD NEUROLOGICAL BOWEL NUTRITION STATUS      Continent Diet (Renal carb modified with 1200 mL fluid restriction)  AMBULATORY STATUS COMMUNICATION OF NEEDS Skin   Limited Assist Verbally Normal  Personal Care Assistance Level of Assistance  Bathing, Feeding, Dressing Bathing Assistance: Limited assistance (Patient reports that she is weak and needs help) Feeding assistance: Independent Dressing Assistance: Limited assistance (Patient reports that she is weak and needs help )     Functional Limitations  Info  Sight, Hearing, Speech Sight Info: Adequate Hearing Info: Adequate Speech Info: Adequate    SPECIAL CARE FACTORS FREQUENCY  PT (By licensed PT), OT (By licensed OT)     PT Frequency: Not evaluated as of 3/23 OT Frequency: Not evaluated as of 3/23            Contractures Contractures Info: Not present    Additional Factors Info  Code Status, Allergies, Isolation Precautions Code Status Info: Full Allergies Info: Nsaids, Vancomycin     Isolation Precautions Info: Droplet precautions     Current Medications (10/27/2015):  This is the current hospital active medication list Current Facility-Administered Medications  Medication Dose Route Frequency Provider Last Rate Last Dose  . 0.9 %  sodium chloride infusion  250 mL Intravenous PRN Dixie Dials, MD      . acetaminophen (TYLENOL) tablet 650 mg  650 mg Oral Q6H PRN Dixie Dials, MD   650 mg at 10/22/15 2223   Or  . acetaminophen (TYLENOL) suppository 650 mg  650 mg Rectal Q6H PRN Dixie Dials, MD      . albuterol (PROVENTIL) (2.5 MG/3ML) 0.083% nebulizer solution 2.5 mg  2.5 mg Nebulization BID Dixie Dials, MD   2.5 mg at 10/26/15 2112  . albuterol (PROVENTIL) (2.5 MG/3ML) 0.083% nebulizer solution 3 mL  3 mL Inhalation Q6H PRN Dixie Dials, MD      . allopurinol (ZYLOPRIM) tablet 100 mg  100 mg Oral Daily Dixie Dials, MD   100 mg at 10/27/15 1244  . amLODipine (NORVASC) tablet 5 mg  5 mg Oral QHS Alric Seton, PA-C   5 mg at 10/26/15 2246  . atorvastatin (LIPITOR) tablet 40 mg  40 mg Oral Daily Dixie Dials, MD   40 mg at 10/27/15 1245  . benzonatate (TESSALON) capsule 100-200 mg  100-200 mg Oral Q6H PRN Dixie Dials, MD   200 mg at 10/24/15 2126  . clonazePAM (KLONOPIN) tablet 1 mg  1 mg Oral BID Dixie Dials, MD   1 mg at 10/27/15 1244  . colchicine tablet 0.3 mg  0.3 mg Oral Once per day on Mon Thu Ajay Kadakia, MD   0.3 mg at 10/27/15 1255  . docusate sodium (COLACE) capsule 100 mg  100 mg Oral BID Dixie Dials,  MD   100 mg at 10/27/15 1244  . doxercalciferol (HECTOROL) injection 2 mcg  2 mcg Intravenous Q T,Th,Sa-HD Corliss Parish, MD   2 mcg at 10/27/15 1047  . DULoxetine (CYMBALTA) DR capsule 60 mg  60 mg Oral Daily Dixie Dials, MD   60 mg at 10/27/15 1243  . famotidine (PEPCID) tablet 20 mg  20 mg Oral Daily Dixie Dials, MD   20 mg at 10/27/15 1244  . heparin injection 5,000 Units  5,000 Units Subcutaneous 3 times per day Dixie Dials, MD   5,000 Units at 10/27/15 1353  . HYDROcodone-acetaminophen (NORCO/VICODIN) 5-325 MG per tablet 1 tablet  1 tablet Oral Q6H PRN Dixie Dials, MD   1 tablet at 10/26/15 2040  . hydrOXYzine (ATARAX/VISTARIL) tablet 25 mg  25 mg Oral TID PRN Dixie Dials, MD      . levothyroxine (SYNTHROID, LEVOTHROID) tablet 175 mcg  175 mcg Oral QAC breakfast Dixie Dials,  MD   175 mcg at 10/27/15 1243  . montelukast (SINGULAIR) tablet 10 mg  10 mg Oral QHS Dixie Dials, MD   10 mg at 10/26/15 2246  . multivitamin (RENA-VIT) tablet 1 tablet  1 tablet Oral QHS Dixie Dials, MD   1 tablet at 10/26/15 2246  . ondansetron (ZOFRAN) tablet 4 mg  4 mg Oral Q6H PRN Dixie Dials, MD   4 mg at 10/26/15 0831  . pregabalin (LYRICA) capsule 50 mg  50 mg Oral Daily Dixie Dials, MD   50 mg at 10/27/15 1245  . QUEtiapine (SEROQUEL) tablet 100 mg  100 mg Oral QHS Dixie Dials, MD   100 mg at 10/26/15 2246  . sevelamer carbonate (RENVELA) tablet 3,200 mg  3,200 mg Oral TID WC Dixie Dials, MD   3,200 mg at 10/27/15 1255  . sevelamer carbonate (RENVELA) tablet 3,200 mg  3,200 mg Oral PRN Dixie Dials, MD   3,200 mg at 10/25/15 2234  . sodium chloride flush (NS) 0.9 % injection 3 mL  3 mL Intravenous Q12H Dixie Dials, MD   3 mL at 10/27/15 1257  . sodium chloride flush (NS) 0.9 % injection 3 mL  3 mL Intravenous PRN Dixie Dials, MD         Discharge Medications: Please see discharge summary for a list of discharge medications.  Relevant Imaging Results:  Relevant Lab  Results:   Additional Information Dialysis patient [facility] - TTS.  SS #[phone]   Sable Feil, LCSW

## 2015-10-27 NOTE — Clinical Social Work Placement (Signed)
   CLINICAL SOCIAL WORK PLACEMENT  NOTE  Date:  10/27/2015  Patient Details  Name: [name] [name] MRN: [id number] Date of Birth: 03/01/1941  Clinical Social Work is seeking post-discharge placement for this patient at the [place] level of care (*CSW will initial, date and re-position this form in  chart as items are completed):  Yes   Patient/family provided with [facility] Work Department's list of facilities offering this level of care within the geographic area requested by the patient (or if unable, by the patient's family).  Yes   Patient/family informed of their freedom to choose among providers that offer the needed level of care, that participate in Medicare, Medicaid or managed care program needed by the patient, have an available bed and are willing to accept the patient.  Yes   Patient/family informed of [facility]'s ownership interest in [facility] and [facility], as well as of the fact that they are under no obligation to receive care at these facilities.  PASRR submitted to EDS on       PASRR number received on       Existing PASRR number confirmed on 10/27/15     FL2 transmitted to all facilities in geographic area requested by pt/family on 10/27/15     FL2 transmitted to all facilities within larger geographic area on       Patient informed that his/her managed care company has contracts with or will negotiate with certain facilities, including the following:            Patient/family informed of bed offers received.  Patient chooses bed at       Physician recommends and patient chooses bed at      Patient to be transferred to   on  .  Patient to be transferred to facility by       Patient family notified on   of transfer.  Name of family member notified:        PHYSICIAN Please prepare priority discharge summary, including medications, Please sign FL2, Please prepare prescriptions     Additional Comment:     _______________________________________________ Sable Feil, LCSW 10/27/2015, 4:20 PM

## 2015-10-27 NOTE — Clinical Social Work Note (Signed)
Clinical Social Work Assessment  Patient Details  Name: [name] [name] MRN: [id number] Date of Birth: 1941-05-07  Date of referral:  10/27/15               Reason for consult:  Facility Placement                Permission sought to share information with:  Chartered certified accountant granted to share information::  Yes, Verbal Permission Granted  Name::        Agency::     Relationship::     Contact Information:     Housing/Transportation Living arrangements for the past 2 months:  Single Family Home Source of Information:  Patient Patient Interpreter Needed:  None Criminal Activity/Legal Involvement Pertinent to Current Situation/Hospitalization:  No - Comment as needed Significant Relationships:  Other Family Members (Patient reported that she has neices and nephews in [place]) Lives with:  Self Do you feel safe going back to the place where you live?  No (Patient reported that she is weak, lives alone and needs rehab before returning home) Need for family participation in patient care:  No (Coment)  Care giving concerns:  Patient reported that she is weak and needs help at home. CSW received verbal consult from MD after his visit with patient and her reporting that she is weak, lives alone and needs rehab before returning home.  Social Worker assessment / plan:  CSW talked with patient regarding discharge planning and her interest in [facility] rehab.  When entered room, CSW observed patient sitting up in a chair putting on the hospital socks. Ms. Risenhoover reported that it takes a lot right now to wash up and put her socks on and she needs some assistance. CSW informed that by patient that she has been to several SNF's and wants [facility]. Patient expressed interest in getting some help at home and this was discussed. Patient is aware that Medicare does not pay for assistance with [facility] needs as Medicaid does.  Ms. Biby reported that she is originally from [place] and came to [place]  many years ago with a man who turned out to be married with children. Patient reported that she does not have any children or family in [place], however she does have nieces/nephews that live in [place].   Employment status:  Retired Forensic scientist:  Information systems manager, Administrator) PT Recommendations:  Not assessed at this time Information / Referral to community resources:  [place] (Patient informed CSW of her facility preference)  Patient/Family's Response to care:  No concerns expressed regarding care during hospitalization.  Patient/Family's Understanding of and Emotional Response to Diagnosis, Current Treatment, and Prognosis:  Not discussed.  Emotional Assessment Appearance:  Appears stated age Attitude/Demeanor/Rapport:  Other (Appropriate) Affect (typically observed):  Calm, Appropriate, Pleasant Orientation:  Oriented to Self, Oriented to Place, Oriented to  Time, Oriented to Situation Alcohol / Substance use:  Tobacco Use (Patient smokes cigarettes) Psych involvement (Current and /or in the community):  No (Comment)  Discharge Needs  Concerns to be addressed:  Discharge Planning Concerns Readmission within the last 30 days:  No Current discharge risk:  None Barriers to Discharge:  No Barriers Identified   Sable Feil, LCSW 10/27/2015, 4:02 PM

## 2015-10-27 NOTE — Progress Notes (Signed)
Ref: Cathlean Cower, MD   Subjective:  Feeling weak. Afebrile.  Objective:  Vital Signs in the last 24 hours: Temp:  [97.1 F (36.2 C)-98.6 F (37 C)] 98.4 F (36.9 C) (03/23 1238) Pulse Rate:  [63-96] 66 (03/23 1700) Cardiac Rhythm:  [-] Normal sinus rhythm (03/23 1148) Resp:  [17-20] 18 (03/23 1700) BP: (82-132)/(50-72) 113/64 mmHg (03/23 1700) SpO2:  [96 %-99 %] 97 % (03/23 1144) Weight:  [67.9 kg (149 lb 11.1 oz)-71.4 kg (157 lb 6.5 oz)] 67.9 kg (149 lb 11.1 oz) (03/23 1144)  Physical Exam: BP Readings from Last 1 Encounters:  10/27/15 113/64    Wt Readings from Last 1 Encounters:  10/27/15 67.9 kg (149 lb 11.1 oz)    Weight change: 6.243 kg (13 lb 12.2 oz)  HEENT: [place]/AT, Eyes-Brown, PERL, EOMI, Conjunctiva-Pink, Sclera-Non-icteric Neck: No JVD, No bruit, Trachea midline. Lungs:  Minimal wheezing, Bilateral. Cardiac:  Regular rhythm, normal S1 and S2, no S3. II/VI systolic murmur Abdomen:  Soft, non-tender. Extremities:  No edema present. No cyanosis. No clubbing. CNS: AxOx3, Cranial nerves grossly intact, moves all 4 extremities. Right handed. Skin: Warm and dry.   Intake/Output from previous day: 03/22 0701 - 03/23 0700 In: 580 [P.O.:480; IV Piggyback:100] Out: 0     Lab Results: BMET    Component Value Date/Time   NA 139 10/27/2015 0825   NA 135 10/25/2015 1235   NA 136 10/23/2015 0523   K 4.6 10/27/2015 0825   K 4.6 10/25/2015 1235   K 4.3 10/23/2015 0523   CL 97* 10/27/2015 0825   CL 96* 10/25/2015 1235   CL 95* 10/23/2015 0523   CO2 27 10/27/2015 0825   CO2 22 10/25/2015 1235   CO2 27 10/23/2015 0523   GLUCOSE 76 10/27/2015 0825   GLUCOSE 172* 10/25/2015 1235   GLUCOSE 104* 10/23/2015 0523   BUN 28* 10/27/2015 0825   BUN 45* 10/25/2015 1235   BUN 16 10/23/2015 0523   CREATININE 7.40* 10/27/2015 0825   CREATININE 8.44* 10/25/2015 1235   CREATININE 4.59* 10/23/2015 0523   CALCIUM 8.7* 10/27/2015 0825   CALCIUM 8.5* 10/25/2015 1235   CALCIUM  8.7* 10/23/2015 0523   GFRNONAA 5* 10/27/2015 0825   GFRNONAA 4* 10/25/2015 1235   GFRNONAA 9* 10/23/2015 0523   GFRAA 6* 10/27/2015 0825   GFRAA 5* 10/25/2015 1235   GFRAA 10* 10/23/2015 0523   CBC    Component Value Date/Time   WBC 5.2 10/27/2015 0824   RBC 3.67* 10/27/2015 0824   RBC 3.08* 03/09/2012 0500   HGB 11.1* 10/27/2015 0824   HCT 33.6* 10/27/2015 0824   PLT 220 10/27/2015 0824   MCV 91.6 10/27/2015 0824   MCH 30.2 10/27/2015 0824   MCHC 33.0 10/27/2015 0824   RDW 16.3* 10/27/2015 0824   LYMPHSABS 1.1 10/20/2015 1638   MONOABS 0.6 10/20/2015 1638   EOSABS 0.1 10/20/2015 1638   BASOSABS 0.0 10/20/2015 1638   HEPATIC Function Panel  Recent Labs  05/25/15 1515 10/20/15 1638  PROT 7.9 8.2*   HEMOGLOBIN A1C No components found for: HGA1C,  MPG CARDIAC ENZYMES Lab Results  Component Value Date   CKTOTAL 84 03/08/2012   TROPONINI <0.03 08/03/2014   BNP No results for input(s): PROBNP in the last 8760 hours. TSH  Recent Labs  03/01/15 1430 05/26/15 1040  TSH 7.33* 1.166   CHOLESTEROL  Recent Labs  03/01/15 1430  CHOL 139    Scheduled Meds: . albuterol  2.5 mg Nebulization BID  . allopurinol  100 mg Oral Daily  . amLODipine  5 mg Oral QHS  . atorvastatin  40 mg Oral Daily  . clonazePAM  1 mg Oral BID  . colchicine  0.3 mg Oral Once per day on Mon Thu  . docusate sodium  100 mg Oral BID  . doxercalciferol  2 mcg Intravenous Q T,Th,Sa-HD  . DULoxetine  60 mg Oral Daily  . famotidine  20 mg Oral Daily  . heparin  5,000 Units Subcutaneous 3 times per day  . levothyroxine  175 mcg Oral QAC breakfast  . montelukast  10 mg Oral QHS  . multivitamin  1 tablet Oral QHS  . pregabalin  50 mg Oral Daily  . QUEtiapine  100 mg Oral QHS  . sevelamer carbonate  3,200 mg Oral TID WC  . 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 SNF per patient and PT recommendation. Left massage x 2 with oncology service for consult/PET scan and treatment     LOS: 7 days    Dixie Dials  MD  10/27/2015, 7:59 PM

## 2015-10-27 NOTE — Evaluation (Signed)
Physical Therapy Evaluation Patient Details Name: [name] [name] MRN: [id number] DOB: 26-Feb-1941 Today's Date: 10/27/2015   History of Present Illness  Patient is a 75 yo female admitted 10/20/15 with cough and SOB - acute asthmatic bronchitis and + Influenza A.  Patient found to have lung mass - lung CA.    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  Will benefit from acute PT to maximize functional mobility prior to discharge.  Recommend SNF to continue therapy at d/c.    Follow Up Recommendations SNF;Supervision for mobility/OOB    Equipment Recommendations  None recommended by PT    Recommendations for Other Services       Precautions / Restrictions Precautions Precautions: Fall Restrictions Weight Bearing Restrictions: No      Mobility  Bed Mobility Overal bed mobility: Modified Independent             General bed mobility comments: Increased time  Transfers Overall transfer level: Needs assistance Equipment used: None Transfers: Sit to/from Stand Sit to Stand: Supervision         General transfer comment: Supervision for safety  Ambulation/Gait Ambulation/Gait assistance: Min guard Ambulation Distance (Feet): 30 Feet Assistive device: None Gait Pattern/deviations: Step-through pattern;Decreased stride length;Drifts right/left Gait velocity: decreased Gait velocity interpretation: Below normal speed for age/gender General Gait Details: Patient with slightly unsteady gait.  Assist for safety.  Decreased activity tolerance due to weakness and DOE.  Stairs            Wheelchair Mobility    Modified Rankin (Stroke Patients Only)       Balance                                              Pertinent Vitals/Pain Pain Assessment: Faces Faces Pain Scale: Hurts even more Pain Location: Back Pain Descriptors / Indicators: Aching;Sore Pain Intervention(s): Limited activity within patient's tolerance;Monitored during session;Repositioned    Home Living Family/patient expects to be discharged to:: Skilled nursing facility Living Arrangements: Alone             Home Equipment: Gilford Rile - 2 wheels;Bedside commode;Shower seat;Wheelchair - manual      Prior Function Level of Independence: Independent               Hand Dominance   Dominant Hand: Left    Extremity/Trunk Assessment   Upper Extremity Assessment: Generalized weakness;LUE deficits/detail       LUE Deficits / Details: Decreased shoulder strength/ROM   Lower Extremity Assessment: Generalized weakness (Peripheral neuropathy BLE's)         Communication   Communication: No difficulties  Cognition Arousal/Alertness: Awake/alert Behavior During Therapy: WFL for tasks assessed/performed Overall Cognitive Status: Within Functional Limits for tasks assessed                      General Comments      Exercises        Assessment/Plan    PT Assessment Patient needs continued PT services  PT Diagnosis Abnormality of gait;Generalized weakness;Acute pain   PT Problem List Decreased strength;Decreased activity tolerance;Decreased balance;Decreased mobility;Decreased knowledge of use of DME;Cardiopulmonary status limiting activity;Impaired  sensation;Pain  PT Treatment Interventions DME instruction;Gait training;Functional mobility training;Therapeutic activities;Therapeutic exercise;Balance training;Patient/family education   PT Goals (Current goals can be found in the Care Plan section) Acute Rehab PT Goals Patient Stated Goal: To get stronger PT Goal Formulation: With patient Time For Goal Achievement: 11/03/15 Potential to Achieve Goals: Good    Frequency Min 3X/week    Barriers to discharge Decreased caregiver support Patient lives alone    Co-evaluation               End of Session   Activity Tolerance: Patient limited by fatigue Patient left: in bed;with call bell/phone within reach Nurse Communication: Mobility status         Time: 2297-9892 PT Time Calculation (min) (ACUTE ONLY): 19 min   Charges:   PT Evaluation $PT Eval Moderate Complexity: 1 Procedure     PT G CodesDespina Pole Nov 20, 2015, 3:49 PM Carita Pian. Sanjuana Kava, [place] Pager [phone]

## 2015-10-27 NOTE — Progress Notes (Signed)
[facility] Progress Note  Assessment: 1. Influenza A: resolved, on Tamiflu. On vanc/ zosyn. Fevers resolved.  2. 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 ESRD - TTS HD 3. Anemia - HGB 11.4. No OP ESA. Follow HGB 4. Metabolic Bone Disease/Secondary hyperparathyroidism -continue hectoral/Renvela. Corr Ca 9.5 5. HTN/volume - at dry wt 6. Nutrition - Albumin 3.0. Renal diet, renal vit. Supplement.  7. COPD exacerbation: per primary 8. Dispo - per primary, stable from renal standpoint    Plan - HD today, min UF  Kelly Splinter MD [place] pager [phone]    cell [phone] 10/27/2015, 1:21 PM    Subjective:   Objective Filed Vitals:   10/27/15 1100 10/27/15 1130 10/27/15 1144 10/27/15 1238  BP: '94/58 82/54 93/60 '$ 102/55  Pulse: 91 96 89 63  Temp:   97.5 F (36.4 C) 98.4 F (36.9 C)  TempSrc:   Oral Oral  Resp: '18 17 18 18  '$ Height:      Weight:   67.9 kg (149 lb 11.1 oz)   SpO2:   97%    Physical Exam General: alert sitting up in bed Heart: RRR II/VI Systolic M. No JVD.  Lungs: mostly clear, occ basilar rales Abdomen:soft non-tender, active BS Extremities: no LE edema Dialysis Access: RUA AVF + bruit  Dialysis Orders: TTS  3h 71mn   68.5kg   2/2.5 bath  Prof 4  RUA AVF   Hep 2200 Hectoral: 2 mcg IV Q tx TTS Venofer: 50 mg IV weekly (being restarted after Fe load T Sat 23% 09/30/15)  Additional Objective Labs: Basic Metabolic Panel:  Recent Labs Lab 10/22/15 1107 10/23/15 0523 10/25/15 1235 10/27/15 0825  NA 133* 136 135 139  K 5.1 4.3 4.6 4.6  CL 92* 95* 96* 97*  CO2 '23 27 22 27  '$ GLUCOSE 130* 104* 172* 76  BUN 38* 16 45* 28*  CREATININE 7.26* 4.59* 8.44* 7.40*  CALCIUM 8.5* 8.7* 8.5* 8.7*  PHOS 5.2*  --  5.0* 5.2*   Liver Function Tests:  Recent Labs Lab 10/20/15 1638  10/22/15 1107 10/25/15 1235 10/27/15 0825  AST 35  --   --   --   --   ALT 27  --   --   --   --   ALKPHOS 97  --    --   --   --   BILITOT 1.8*  --   --   --   --   PROT 8.2*  --   --   --   --   ALBUMIN 3.9  < > 3.0* 2.7* 2.7*  < > = values in this interval not displayed. No results for input(s): LIPASE, AMYLASE in the last 168 hours. CBC:  Recent Labs Lab 10/20/15 1638 10/20/15 2200 10/22/15 1107 10/25/15 1236 10/27/15 0824  WBC 9.7 8.4 7.8 7.1 5.2  NEUTROABS 7.9*  --   --   --   --   HGB 13.5 12.3 11.4* 11.5* 11.1*  HCT 43.2 38.7 35.7* 34.1* 33.6*  MCV 95.4 93.3 93.2 90.5 91.6  PLT 220 227 187 198 220   Blood Culture    Component Value Date/Time   SDES BRONCHIAL ALVEOLAR LAVAGE 10/25/2015 0830   SPECREQUEST NONE 10/25/2015 0830   CULT  10/25/2015 0830    YEAST CONSISTENT WITH CANDIDA SPECIES Performed at [facility]03/23/2017 FINAL 10/25/2015 0830    Cardiac Enzymes: No results for  input(s): CKTOTAL, CKMB, CKMBINDEX, TROPONINI in the last 168 hours. CBG: No results for input(s): GLUCAP in the last 168 hours. Iron Studies: No results for input(s): IRON, TIBC, TRANSFERRIN, FERRITIN in the last 72 hours. '@lablastinr3'$ @ Studies/Results: No results found. Medications:   . albuterol  2.5 mg Nebulization BID  . allopurinol  100 mg Oral Daily  . amLODipine  5 mg Oral QHS  . atorvastatin  40 mg Oral Daily  . clonazePAM  1 mg Oral BID  . colchicine  0.3 mg Oral Once per day on Mon Thu  . docusate sodium  100 mg Oral BID  . doxercalciferol  2 mcg Intravenous Q T,Th,Sa-HD  . DULoxetine  60 mg Oral Daily  . famotidine  20 mg Oral Daily  . heparin  5,000 Units Subcutaneous 3 times per day  . levothyroxine  175 mcg Oral QAC breakfast  . montelukast  10 mg Oral QHS  . multivitamin  1 tablet Oral QHS  . pregabalin  50 mg Oral Daily  . QUEtiapine  100 mg Oral QHS  . sevelamer carbonate  3,200 mg Oral TID WC  . sodium chloride flush  3 mL Intravenous Q12H

## 2015-10-28 ENCOUNTER — Other Ambulatory Visit: Payer: Self-pay | Admitting: Oncology

## 2015-10-28 MED ORDER — COLCHICINE 0.6 MG PO TABS: 0.3000 mg | ORAL_TABLET | ORAL | Status: AC

## 2015-10-28 MED ORDER — CLONAZEPAM 1 MG PO TABS: 1.0000 mg | ORAL_TABLET | Freq: Every day | ORAL | Status: DC

## 2015-10-28 MED ORDER — FAMOTIDINE 40 MG PO TABS: 40.0000 mg | ORAL_TABLET | Freq: Every day | ORAL | Status: DC

## 2015-10-28 MED ORDER — DOXERCALCIFEROL 4 MCG/2ML IV SOLN: 2.0000 ug | INTRAVENOUS | Status: AC

## 2015-10-28 MED ORDER — PREGABALIN 50 MG PO CAPS
50.0000 mg | ORAL_CAPSULE | Freq: Every day | ORAL | Status: DC
Start: 2015-10-29 — End: 2015-10-28

## 2015-10-28 MED ORDER — WHITE PETROLATUM GEL
Status: AC
  Administered 2015-10-28: 0.2
  Filled 2015-10-28: qty 1

## 2015-10-28 NOTE — Care Management Important Message (Signed)
Important Message  Patient Details  Name: Trinia Georgi Scarberry MRN: [id number] Date of Birth: [birth year]   Medicare Important Message Given:  Yes    Areliz Rothman, Rory Percy, RN 10/28/2015, 12:07 PM

## 2015-10-28 NOTE — Progress Notes (Signed)
Physical Therapy Treatment Patient Details Name: [name] [name] MRN: [id number] DOB: 11/16/1940 Today's Date: 10/28/2015    History of Present Illness Patient is a 75 yo female admitted 10/20/15 with cough and SOB - acute asthmatic bronchitis and + Influenza A.  Patient found to have lung mass - lung CA.    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  RA sats 99%.  Assisted OOB to amb a greater distace in hallway lowest RA 88% with HR 110.  Max c/o feeling "tiered".  Unsteady gait.  Pt holding to dynamat to staedy self 75% of time.  Shuffled feet.  Limited activity tolerance.  Pt would benefit from ST Rehab at SNF to regain prior level of independence.   Follow Up Recommendations  SNF     Equipment Recommendations       Recommendations for Other Services       Precautions / Restrictions Precautions Precautions: Fall Precaution Comments: monitor O2 sats  new Dx lung CA Restrictions Weight Bearing Restrictions: No    Mobility  Bed Mobility Overal bed mobility: Modified Independent             General bed mobility comments: Increased time  Transfers Overall transfer level: Needs assistance Equipment used: None Transfers: Sit to/from Stand Sit to Stand: Supervision         General transfer comment: good use of hands and safety cognition  Ambulation/Gait Ambulation/Gait assistance: Min guard;Supervision Ambulation Distance (Feet): 75 Feet Assistive device: None Gait Pattern/deviations: Step-through pattern;Decreased stride length Gait velocity: decreased   General Gait Details: Patient with slightly unsteady gait.  Assist for safety.  Decreased activity tolerance due to weakness and DOE.   Lowest RA  88% with HR 110 and only 1/4 DOE.  Feels tiered   Financial trader Rankin (Stroke Patients Only)       Balance                                    Cognition Arousal/Alertness: Awake/alert Behavior During Therapy: WFL for tasks assessed/performed Overall Cognitive Status: Within Functional Limits for tasks assessed                      Exercises      General Comments        Pertinent Vitals/Pain Pain Assessment: No/denies pain    Home Living                      Prior Function            PT Goals (current goals can now be found in the care plan section) Progress towards PT goals: Progressing toward goals    Frequency  Min 3X/week    PT Plan Current plan remains appropriate    Co-evaluation             End of Session Equipment Utilized During Treatment: Gait belt Activity Tolerance: Patient limited by fatigue Patient left: in bed;with call bell/phone within reach     Time: 1143-1208 PT Time  Calculation (min) (ACUTE ONLY): 25 min  Charges:  $Gait Training: 8-22 mins $Therapeutic Activity: 8-22 mins                    G Codes:      Rica Koyanagi  PTA [facility]  Acute  Rehab Pager      [phone]

## 2015-10-28 NOTE — Care Management Note (Signed)
Case Management Note  Patient Details  Name: Sadiyah Kangas Gullikson MRN: [id number] Date of Birth: [birth year]  Subjective/Objective:          CM following for progression and d/c planning.           Action/Plan: 10/28/2015 Noted plan for pt to d/c to short term SNF for rehab. No HH or DME needs at this time.   Expected Discharge Date:   10/28/15               Expected Discharge Plan:  Skilled Nursing Facility  In-House Referral:  Clinical Social Work  Discharge planning Services  CM Consult  Post Acute Care Choice:  NA Choice offered to:  NA  DME Arranged:   NA DME Agency:   NA  HH Arranged:   NA HH Agency:   NA  Status of Service:  Completed, signed off  Medicare Important Message Given:  Yes Date Medicare IM Given:    Medicare IM give by:    Date Additional Medicare IM Given:    Additional Medicare Important Message give by:     If discussed at [place] of Stay Meetings, dates discussed:    Additional Comments:  Adron Bene, RN 10/28/2015, 11:02 AM

## 2015-10-28 NOTE — Progress Notes (Signed)
PHYSICAL THERAPY  SATURATION QUALIFICATIONS: (This note is used to comply with regulatory documentation for home oxygen)  Patient Saturations on Room Air at Rest = 99%  Patient Saturations on Room Air while Ambulating = 88%  Patient Saturation on Room Air after amb at rest     92%  Patient Saturations on          Liters of oxygen while Ambulating = %  Please briefly explain why patient needs home oxygen:  Supplemental oxygen was not needed during amb sats 88%  Rica Koyanagi  PTA [facility]  Acute  Rehab Pager      [phone]

## 2015-10-28 NOTE — Progress Notes (Signed)
Patient discharged to [facility].  Friend took her to the facility in car.  She took all her belongings with her.  Gave her a copy of discharge summary.  IV and Tele discontinued before discharge.  CCMD notified.  Staff accompanied her out of the unit.  Gave report to the nurse Quillian Quince at [facility] and answered all her questions.

## 2015-10-28 NOTE — Discharge Summary (Signed)
Physician Discharge Summary  Patient ID: [name] [name] MRN: [id number] DOB/AGE: 09-Sep-1940 75 y.o.  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 Chest X-ray was suggestive of right upper lung mass. She responded well to IV antibiotics and breathing treatments. Nephrology consult provided hemodialysis including correction of hyperkalemia. She had biopsy of right lung by pulmonary care service doctor. She is awaiting PET scan and other work up by Oncology doctor for her squamous cell cancer with at least local metastasis. She will be followed by her primary doctor in 1 month and by me as needed.  Consults: pulmonary/intensive care and nephrology  Significant Diagnostic Studies: labs: Normal CBC, Elevated potassium of 7.5, improved with dialysis. BUN 63, Cr-9.47. Acid fast smear of lung biopsy-  negative. Bronchial brushing-Squamous cell carcinoma.  EKG-Sinus tachycardia.  Chest X-ray: No acute cardiopulmonary disease. 3.1 cm masslike opacity over the right upper lobe. Recommend chest CT for further evaluation to exclude malignancy.  CT chest: 1. Peripheral apical right upper lobe 4.4 cm lung mass, which abuts the visceral pleural, suspicious for a primary bronchogenic carcinoma. 2. Right hilar and subcarinal and AP window mediastinal lymphadenopathy, suspicious for nodal metastases. 3. Occlusion of right mainstem bronchus by nonspecific material, possibly tumor and/or mucous plugging. Patchy peribronchovascular ground-glass opacity throughout the central right upper lobe, probably postobstructive pneumonitis. 4. Left upper lobe 5 mm pulmonary nodule, indeterminate. Tiny subpleural right middle lobe pulmonary nodules are probably benign. 5. Additional findings include coronary atherosclerosis and mild emphysema.  Treatments: antibiotics: Albuterol nebulizer treatments, antibiotics- vancomycin and Zosyn and hemodialysis.  Discharge Exam: Blood pressure 110/51, pulse 99, temperature 98.2 F (36.8 C), temperature source Oral, resp. rate 22, height '5\' 5"'$  (1.651 m), weight 68.493 kg (151 lb), SpO2 89 %.  HEENT: [place]/AT, Eyes-Brown, PERL, EOMI, Conjunctiva-Pink, Sclera-Non-icteric Neck: No JVD, No bruit, Trachea midline. Lungs: Clearing, Bilateral. Cardiac: Regular rhythm, normal S1 and S2, no S3. II/VI systolic murmur. Abdomen: Soft, non-tender. Extremities: No edema present. No cyanosis. No clubbing.AVF in right upper arm. CNS: AxOx3, Cranial nerves grossly intact, moves all 4 extremities. Right handed. Skin: Warm and dry. Disposition: 06-Home-Health Care Svc     Medication List    STOP taking these medications        azithromycin 250 MG tablet  Commonly known as:  ZITHROMAX Z-PAK     omeprazole 40 MG capsule  Commonly known as:  PRILOSEC      TAKE  these medications         acetaminophen 325 MG tablet  Commonly known as:  TYLENOL  Take 2 tablets (650 mg total) by mouth every 6 (six) hours as needed for mild pain (or Fever >/= 101).     albuterol 108 (90 Base) MCG/ACT inhaler  Commonly known as:  PROVENTIL HFA;VENTOLIN HFA  Inhale 2 puffs into the lungs every 6 (six) hours as needed for wheezing or shortness of breath.     allopurinol 100 MG tablet  Commonly known as:  ZYLOPRIM  TAKE ONE TABLET BY MOUTH ONCE DAILY     amLODipine 5 MG tablet  Commonly known as:  NORVASC  Take 5 mg by mouth daily.     atorvastatin 40 MG tablet  Commonly known as:  LIPITOR  TAKE ONE TABLET BY MOUTH ONCE DAILY     benzonatate 100 MG capsule  Commonly known as:  TESSALON  Take 100-200 mg by mouth every 6 (six) hours as needed for cough.     clonazePAM 1 MG tablet  Commonly known as:  KLONOPIN  Take 1 tablet (1 mg total) by mouth daily.     colchicine 0.6 MG tablet  Take 0.5 tablets (0.3 mg total) by mouth 2 (two) times a week.     cyclobenzaprine 10 MG tablet  Commonly known as:  FLEXERIL  TAKE ONE TABLET BY MOUTH THREE TIMES DAILY AS NEEDED FOR MUSCLE SPASM     darbepoetin 25 MCG/0.42ML Soln injection  Commonly known as:  ARANESP  Inject 25 mcg into the vein every 7 (seven) days. Every Thursday at dialysis     docusate sodium 100 MG capsule  Commonly known as:  COLACE  Take 100 mg by mouth 2 (two) times daily.     doxercalciferol 4 MCG/2ML injection  Commonly known as:  HECTOROL  Inject 1 mL (2 mcg total) into the vein Every Tuesday,Thursday,and Saturday with dialysis.     DULoxetine 30 MG capsule  Commonly known as:  CYMBALTA  Take 2 capsules (60 mg total) by mouth daily.     famotidine 40 MG tablet  Commonly known as:  PEPCID  Take 1 tablet (40 mg total) by mouth daily.     HYDROcodone-acetaminophen 5-325 MG tablet  Commonly known as:  NORCO/VICODIN  Take 1 tablet by mouth every 6 (six) hours as needed for severe pain.     hydrOXYzine 25  MG tablet  Commonly known as:  ATARAX/VISTARIL  TAKE ONE TABLET BY MOUTH EVERY 6 HOURS AS NEEDED FOR ANXIETY.     lactulose 10 GM/15ML solution  Commonly known as:  CHRONULAC  Take 30 mLs (20 g total) by mouth every other day.     levothyroxine 175 MCG tablet  Commonly known as:  SYNTHROID, LEVOTHROID  Take 1 tablet (175 mcg total) by mouth daily before breakfast.     mometasone-formoterol 100-5 MCG/ACT Aero  Commonly known as:  DULERA  INHALE TWO PUFFS INTO LUNGS TWICE DAILY     montelukast 10 MG tablet  Commonly known as:  SINGULAIR  TAKE ONE TABLET BY MOUTH AT BEDTIME     multivitamin Tabs tablet  Take 1 tablet by mouth daily.     ondansetron 4 MG tablet  Commonly known as:  ZOFRAN  Take 4 mg by mouth every 6 (six) hours as needed for nausea or vomiting.     pregabalin 50 MG capsule  Commonly known as:  LYRICA  Take 50 mg by mouth daily.  QUEtiapine 100 MG tablet  Commonly known as:  SEROQUEL  TAKE ONE TABLET BY MOUTH AT BEDTIME     sevelamer carbonate 800 MG tablet  Commonly known as:  RENVELA  Take 3,200 mg by mouth 3 (three) times daily with meals. 4 tablets with each meal and 3 tablets with each snack. ( pt takes 7 times daily)           Follow-up Information    Follow up with [facility].   Specialty:  [place]   Contact information:   [address] [place] [phone]      Follow up with Cathlean Cower, MD. Schedule an appointment as soon as possible for a visit in 1 month.   Specialties:  Internal Medicine, Radiology   Contact information:   [place] [address] [phone]       Follow up with [facility], MD In 1 month.   Specialty:  Cardiology   Why:  As needed   Contact information:   [place] [address] [phone]       Signed: Birdie Riddle 10/28/2015, 2:59 PM

## 2015-10-28 NOTE — Progress Notes (Signed)
[name] Progress Note  Assessment: 1. Influenza A: resolved, on Tamiflu. On vanc/ zosyn. Fevers resolved.  2. 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 ESRD - TTS HD 3. Anemia - HGB 11.4. No OP ESA. Follow HGB 4. Metabolic Bone Disease/Secondary hyperparathyroidism -continue hectoral/Renvela. Corr Ca 9.5 5. HTN/volume - at dry wt 6. Nutrition - Albumin 3.0. Renal diet, renal vit. Supplement.  7. COPD exacerbation: per primary 8. Dispo - per primary, stable for dc from renal standpoint    Plan - for prob dc to SNF today per primary  Kelly Splinter MD [place] pager [phone]    cell [phone] 10/28/2015, 3:10 PM    Subjective:   Objective Filed Vitals:   10/27/15 2036 10/27/15 2059 10/28/15 0550 10/28/15 0817  BP:  96/76 116/63 110/51  Pulse: 89 60 78 99  Temp:  98.3 F (36.8 C) 98 F (36.7 C) 98.2 F (36.8 C)  TempSrc:    Oral  Resp: '18 23 22 22  '$ Height:      Weight: 68.493 kg (151 lb)     SpO2: 98% 92% 95% 89%   Physical Exam General: alert sitting up in bed Heart: RRR II/VI Systolic M. No JVD.  Lungs: mostly clear, occ basilar rales Abdomen:soft non-tender, active BS Extremities: no LE edema Dialysis Access: RUA AVF + bruit  Dialysis Orders: TTS  3h 68mn   68.5kg   2/2.5 bath  Prof 4  RUA AVF   Hep 2200 Hectoral: 2 mcg IV Q tx TTS Venofer: 50 mg IV weekly (being restarted after Fe load T Sat 23% 09/30/15)  Additional Objective Labs: Basic Metabolic Panel:  Recent Labs Lab 10/22/15 1107 10/23/15 0523 10/25/15 1235 10/27/15 0825  NA 133* 136 135 139  K 5.1 4.3 4.6 4.6  CL 92* 95* 96* 97*  CO2 '23 27 22 27  '$ GLUCOSE 130* 104* 172* 76  BUN 38* 16 45* 28*  CREATININE 7.26* 4.59* 8.44* 7.40*  CALCIUM 8.5* 8.7* 8.5* 8.7*  PHOS 5.2*  --  5.0* 5.2*   Liver Function Tests:  Recent Labs Lab 10/22/15 1107 10/25/15 1235 10/27/15 0825  ALBUMIN 3.0* 2.7* 2.7*   No results for input(s):  LIPASE, AMYLASE in the last 168 hours. CBC:  Recent Labs Lab 10/22/15 1107 10/25/15 1236 10/27/15 0824  WBC 7.8 7.1 5.2  HGB 11.4* 11.5* 11.1*  HCT 35.7* 34.1* 33.6*  MCV 93.2 90.5 91.6  PLT 187 198 220   Blood Culture    Component Value Date/Time   SDES BRONCHIAL ALVEOLAR LAVAGE 10/25/2015 0830   SPECREQUEST NONE 10/25/2015 0830   CULT  10/25/2015 0830    YEAST CONSISTENT WITH CANDIDA SPECIES Performed at [facility]03/23/2017 FINAL 10/25/2015 0830    Cardiac Enzymes: No results for input(s): CKTOTAL, CKMB, CKMBINDEX, TROPONINI in the last 168 hours. CBG: No results for input(s): GLUCAP in the last 168 hours. Iron Studies: No results for input(s): IRON, TIBC, TRANSFERRIN, FERRITIN in the last 72 hours. '@lablastinr3'$ @ Studies/Results: No results found. Medications:   . allopurinol  100 mg Oral Daily  . amLODipine  5 mg Oral QHS  . atorvastatin  40 mg Oral Daily  . clonazePAM  1 mg Oral BID  . colchicine  0.3 mg Oral Once per day on Mon Thu  . docusate sodium  100 mg Oral BID  . doxercalciferol  2 mcg Intravenous Q T,Th,Sa-HD  . DULoxetine  60 mg  Oral Daily  . famotidine  20 mg Oral Daily  . heparin  5,000 Units Subcutaneous 3 times per day  . levothyroxine  175 mcg Oral QAC breakfast  . montelukast  10 mg Oral QHS  . multivitamin  1 tablet Oral QHS  . [START ON 10/29/2015] pregabalin  50 mg Oral QHS  . QUEtiapine  100 mg Oral QHS  . sevelamer carbonate  3,200 mg Oral TID WC  . sodium chloride flush  3 mL Intravenous Q12H

## 2015-10-28 NOTE — Clinical Social Work Note (Addendum)
Patient continues to have bed available at [facility] when medically stable for discharge.  Patient projected to discharge today.    Patient will discharge today pending MD order. Patient will discharge to: [facility] SN[facility] RN to call report prior to transportation to: [phone] Transportation: [facility]- to be scheduled after summary and paperwork has been received; admission's liaison will be signing paperwork at bedside after lunch    Nonnie Done, LCSW [phone]  [id number], 2S 15-16 and Psychiatric Service Line  Licensed Clinical Social Worker

## 2015-10-31 ENCOUNTER — Non-Acute Institutional Stay (SKILLED_NURSING_FACILITY): Payer: Medicare Other | Admitting: Internal Medicine

## 2015-10-31 ENCOUNTER — Telehealth: Payer: Self-pay | Admitting: *Deleted

## 2015-10-31 ENCOUNTER — Encounter: Payer: Self-pay | Admitting: Internal Medicine

## 2015-10-31 DIAGNOSIS — F17200 Nicotine dependence, unspecified, uncomplicated: Secondary | ICD-10-CM

## 2015-10-31 DIAGNOSIS — K219 Gastro-esophageal reflux disease without esophagitis: Secondary | ICD-10-CM | POA: Diagnosis not present

## 2015-10-31 DIAGNOSIS — F418 Other specified anxiety disorders: Secondary | ICD-10-CM | POA: Diagnosis not present

## 2015-10-31 DIAGNOSIS — N186 End stage renal disease: Secondary | ICD-10-CM

## 2015-10-31 DIAGNOSIS — C3491 Malignant neoplasm of unspecified part of right bronchus or lung: Secondary | ICD-10-CM | POA: Diagnosis not present

## 2015-10-31 DIAGNOSIS — D638 Anemia in other chronic diseases classified elsewhere: Secondary | ICD-10-CM | POA: Diagnosis not present

## 2015-10-31 DIAGNOSIS — E785 Hyperlipidemia, unspecified: Secondary | ICD-10-CM | POA: Diagnosis not present

## 2015-10-31 DIAGNOSIS — R5381 Other malaise: Secondary | ICD-10-CM | POA: Diagnosis not present

## 2015-10-31 DIAGNOSIS — K5900 Constipation, unspecified: Secondary | ICD-10-CM | POA: Diagnosis not present

## 2015-10-31 DIAGNOSIS — M545 Low back pain: Secondary | ICD-10-CM

## 2015-10-31 DIAGNOSIS — K5909 Other constipation: Secondary | ICD-10-CM

## 2015-10-31 DIAGNOSIS — J438 Other emphysema: Secondary | ICD-10-CM

## 2015-10-31 DIAGNOSIS — I10 Essential (primary) hypertension: Secondary | ICD-10-CM | POA: Diagnosis not present

## 2015-10-31 DIAGNOSIS — J209 Acute bronchitis, unspecified: Secondary | ICD-10-CM | POA: Diagnosis not present

## 2015-10-31 DIAGNOSIS — R300 Dysuria: Secondary | ICD-10-CM | POA: Diagnosis not present

## 2015-10-31 DIAGNOSIS — G8929 Other chronic pain: Secondary | ICD-10-CM

## 2015-10-31 DIAGNOSIS — Z992 Dependence on renal dialysis: Secondary | ICD-10-CM

## 2015-10-31 NOTE — Progress Notes (Signed)
LOCATION: [name]  PCP: [name] Cower, MD   Code Status: Full Code  Goals of care: Advanced Directive information Advanced Directives 10/25/2015  Does patient have an advance directive? Yes  Type of Advance Directive Living will;Healthcare Power of Attorney  Does patient want to make changes to advanced directive? -  Copy of advanced directive(s) in chart? Yes       Extended Emergency Contact Information Primary Emergency Contact: [name] [place] of [place] Mobile Phone: [phone] Relation: Niece Secondary Emergency Contact: [name], [place] Mobile Phone: [phone] Relation: Friend   Allergies  Allergen Reactions  . Cephalosporins Itching and Rash    Vanc and fortaz given at the same time in June for several doses at dialysis with systemic rash and itching; received zinacef 7/5 and had worseningsystemicrash/ itching and swelling of eyes - so unclear if allergic to either or both-  Pt denies this  . Nsaids     REACTION: renal dysfunction  . Pioglitazone Swelling    Actos REACTION: EDEMA  . Vancomycin Rash    See comment under cephalosporin    Chief Complaint  Patient presents with  . New Admit To SNF    New Admission     HPI:  Patient is a 75 y.o. female seen today for short term rehabilitation post [name] from 10/20/15-10/28/15 with worsening dyspnea. She was started on antibiotics for possible bronchitis with bronchodilator treatment. CXR revealed a right upper lung mass. 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 She underwent HD in the [name] with renal team. She has PMH of hypertension, DM type II, Dyslipidemia, tobacco use disorder, Anxiety and depression, ESRD on hemodialysis, PVD with left and right 2nd toe amputations among others. She continues to smoke at present.    Review of Systems:  Constitutional: Negative for fever, chills, diaphoresis. Feels low in  terms of her energy. HENT: Negative for headache, congestion, sore throat, difficulty swallowing. Positive for clear nasal discharge.  Eyes: Negative for blurred vision, double vision and discharge.  Respiratory: Negative for shortness of breath and wheezing. Positive for cough with clear to brown phlegm. Cardiovascular: Negative for chest pain, palpitations, leg swelling.  Gastrointestinal: Negative for heartburn, nausea, vomiting, abdominal pain, loss of appetite, melena, diarrhea. Last bowel movement was 2 days ago. Genitourinary: Negative for dysuria and flank pain. Has some burning with urination.   Musculoskeletal: Negative for fall in the facility. Positive for chronic low back pain. Skin: Negative for itching, rash.  Neurological: Negative for weakness and dizziness. Psychiatric/Behavioral: Negative for memory loss. Positive for anxiety, depression and insomnia.    Past Medical History  Diagnosis Date  . HYPOTHYROIDISM 02/17/2007    s/p surgical removal of goiter in 1997  . HYPERLIPIDEMIA 02/17/2007  . GOUT 05/29/2007  . ANEMIA-NOS 05/29/2007  . ANXIETY 03/23/2010  . 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 DEPRESSION 02/17/2007  . RESTLESS LEG SYNDROME 05/29/2007  . PERIPHERAL NEUROPATHY 05/29/2007  . HYPERTENSION 02/17/2007  . PERIPHERAL VASCULAR DISEASE 02/17/2007  . PEPTIC ULCER DISEASE 05/29/2007    "when I was in college"  . OSTEOPENIA 09/22/2009  . SEIZURE DISORDER 02/17/2007  . Memory loss 01/24/2010  . Personal history of colonic polyps 11/16/2009  . Palpitations 09/08/2010  . Vocal cord paralysis 1996  . 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 Chronic neck pain 12/13/2010  . GERD (gastroesophageal reflux disease)   . Critical lower limb ischemia   . ESRD  on hemodialysis [name]) 02/17/2007    Started dialysis April 2014.  G[facility] at [name] on TTS schedule.  Cause of ESRD was HTN.     . Cancer of kidney ([name]) 10/07/2012    Followed per Dr [name] Pole, MD, urology, [name]   . ESRD  (end stage renal disease) on dialysis ([name])     "TTS. [address]" (10/20/2015)  . 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 Heart murmur 10/02/2010    hx  . Chronic bronchitis ([place])   . Type II diabetes mellitus ([place]) 02/17/2007    "haven't had it since ~ 2010" (10/20/2015)  . COMMON MIGRAINE     "stress common migraines"  . Arthritis     "all over my body"  . Chronic lower back pain   . Renal insufficiency    Past Surgical History  Procedure Laterality Date  . Toe amputation Left 2006  . Bunionectomy Bilateral 1980  . Thyroid surgery  1997    goiter removal  . Stress cardiolite  06/18/2006  . Tranthoracic echocardiogram  06/18/2006  . Electrocardiogram  05/29/2006  . Shoulder open rotator cuff repair Left     Dr. Sharol Given  . Av fistula placement  03/13/2012    Procedure: ARTERIOVENOUS (AV) FISTULA CREATION;  Surgeon: Conrad Huetter, MD;  Location: [facility];  Service: Vascular;  Laterality: Right;  . Cataract extraction, bilateral Bilateral     bilateral cataract removal  . Esophagoscopy w/ botox injection  07/22/2012    Procedure: ESOPHAGOSCOPY WITH BOTOX INJECTION;  Surgeon: Rozetta Nunnery, MD;  Location: [facility];  Service: ENT;  Laterality: N/A;  esophageal dilation  . Insertion of dialysis catheter N/A 02/05/2013    Procedure: INSERTION OF DIALYSIS CATHETER;  Surgeon: Angelia Mould, MD;  Location: [facility];  Service: Vascular;  Laterality: N/A;  Ultrasound guided  . Toe amputation Right Aug. 2015    2nd   . Laparoscopic cholecystectomy    . Video bronchoscopy Bilateral 10/25/2015    Procedure: VIDEO BRONCHOSCOPY WITH FLUORO;  Surgeon: Rigoberto Noel, MD;  Location: [place] [facility];  Service: Cardiopulmonary;  Laterality: Bilateral;   Social History:   reports that she has been smoking Cigarettes.  She has a 12.75 pack-year smoking history. She has never used smokeless tobacco. She reports that she does not drink alcohol or  use illicit drugs.  Family History  Problem Relation Age of Onset  . Dementia Mother   . Hypertension Mother   . Coronary artery disease Other   . Hyperlipidemia Other   . Hypertension Other   . Ovarian cancer Other   . Stroke Other   . Hypertension Sister   . Hypertension Brother   . Heart attack Brother   . Stroke Brother     Medications:   Medication List       This list is accurate as of: 10/31/15  3:43 PM.  Always use your most recent med list.               acetaminophen 325 MG tablet  Commonly known as:  TYLENOL  Take 2 tablets (650 mg total) by mouth every 6 (six) hours as needed for mild pain (or Fever >/= 101).     albuterol 108 (90 Base) MCG/ACT inhaler  Commonly known as:  PROVENTIL HFA;VENTOLIN HFA  Inhale 2 puffs into the lungs every 6 (six) hours as needed for wheezing or shortness of  breath.     allopurinol 100 MG tablet  Commonly known as:  ZYLOPRIM  TAKE ONE TABLET BY MOUTH ONCE DAILY     amLODipine 5 MG tablet  Commonly known as:  NORVASC  Take 5 mg by mouth daily.     atorvastatin 40 MG tablet  Commonly known as:  LIPITOR  TAKE ONE TABLET BY MOUTH ONCE DAILY     benzonatate 100 MG capsule  Commonly known as:  TESSALON  Take 100-200 mg by mouth every 6 (six) hours as needed for cough.     clonazePAM 1 MG tablet  Commonly known as:  KLONOPIN  Take 1 tablet (1 mg total) by mouth daily.     colchicine 0.6 MG tablet  Take 0.5 tablets (0.3 mg total) by mouth 2 (two) times a week.     cyclobenzaprine 10 MG tablet  Commonly known as:  FLEXERIL  TAKE ONE TABLET BY MOUTH THREE TIMES DAILY AS NEEDED FOR MUSCLE SPASM     darbepoetin 25 MCG/0.42ML Soln injection  Commonly known as:  ARANESP  Inject 25 mcg into the vein every 7 (seven) days. Every Thursday at dialysis     docusate sodium 100 MG capsule  Commonly known as:  COLACE  Take 100 mg by mouth 2 (two) times daily.     doxercalciferol 4 MCG/2ML injection  Commonly known as:  HECTOROL   Inject 1 mL (2 mcg total) into the vein Every Tuesday,Thursday,and Saturday with dialysis.     DULoxetine 30 MG capsule  Commonly known as:  CYMBALTA  Take 2 capsules (60 mg total) by mouth daily.     famotidine 40 MG tablet  Commonly known as:  PEPCID  Take 1 tablet (40 mg total) by mouth daily.     HYDROcodone-acetaminophen 5-325 MG tablet  Commonly known as:  NORCO/VICODIN  Take 1 tablet by mouth every 6 (six) hours as needed for severe pain.     hydrOXYzine 25 MG tablet  Commonly known as:  ATARAX/VISTARIL  TAKE ONE TABLET BY MOUTH EVERY 6 HOURS AS NEEDED FOR ANXIETY.     lactulose 10 GM/15ML solution  Commonly known as:  CHRONULAC  Take 30 mLs (20 g total) by mouth every other day.     levothyroxine 175 MCG tablet  Commonly known as:  SYNTHROID, LEVOTHROID  Take 1 tablet (175 mcg total) by mouth daily before breakfast.     mometasone-formoterol 100-5 MCG/ACT Aero  Commonly known as:  DULERA  INHALE TWO PUFFS INTO LUNGS TWICE DAILY     montelukast 10 MG tablet  Commonly known as:  SINGULAIR  TAKE ONE TABLET BY MOUTH AT BEDTIME     multivitamin Tabs tablet  Take 1 tablet by mouth daily.     ondansetron 4 MG tablet  Commonly known as:  ZOFRAN  Take 4 mg by mouth every 6 (six) hours as needed for nausea or vomiting.     pregabalin 50 MG capsule  Commonly known as:  LYRICA  Take 50 mg by mouth daily.     QUEtiapine 100 MG tablet  Commonly known as:  SEROQUEL  TAKE ONE TABLET BY MOUTH AT BEDTIME     sevelamer carbonate 800 MG tablet  Commonly known as:  RENVELA  Take 3,200 mg by mouth 3 (three) times daily with meals. 4 tablets with each meal and 3 tablets with each snack. ( pt takes 7 times daily)        Immunizations: Immunization History  Administered Date(s) Administered  .  Influenza Split 04/24/2012  . Influenza Whole 04/25/2007, 05/06/2010  . Influenza, High Dose Seasonal PF 06/15/2015  . Influenza-Unspecified 05/18/2014  . PPD Test 10/28/2015  .  Pneumococcal Conjugate-13 10/14/2013  . Pneumococcal Polysaccharide-23 04/25/2007  . Td 09/15/2008     Physical Exam: Filed Vitals:   10/31/15 1523  BP: 122/64  Pulse: 88  Temp: 98.1 F (36.7 C)  TempSrc: Oral  Resp: 20  Height: '5\' 5"'$  (1.651 m)  Weight: 151 lb (68.493 kg)  SpO2: 95%   Body mass index is 25.13 kg/(m^2).  General- elderly female, well built, in no acute distress Head- normocephalic, atraumatic Nose- no maxillary or frontal sinus tenderness, no nasal discharge Throat- moist mucus membrane Eyes- PERRLA, EOMI, no pallor, no icterus, no discharge, normal conjunctiva, normal sclera Neck- no cervical lymphadenopathy Cardiovascular- normal s1,s2, no murmur, no leg edema Respiratory- bilateral clear to auscultation, no wheeze, no rhonchi, no crackles, no use of accessory muscles Abdomen- bowel sounds present, soft, non tender Musculoskeletal- able to move all 4 extremities, generalized weakness Neurological- no focal deficit, alert and oriented to person, place and time Skin- warm and dry, right upper arm AV fistula with good thrill Psychiatry- normal mood and affect    Labs reviewed: Basic Metabolic Panel:  Recent Labs  10/22/15 1107 10/23/15 0523 10/25/15 1235 10/27/15 10/27/15 0825  NA 133* 136 135 139 139  K 5.1 4.3 4.6  --  4.6  CL 92* 95* 96*  --  97*  CO2 '23 27 22  '$ --  27  GLUCOSE 130* 104* 172*  --  76  BUN 38* 16 45* 28* 28*  CREATININE 7.26* 4.59* 8.44* 7.4* 7.40*  CALCIUM 8.5* 8.7* 8.5*  --  8.7*  PHOS 5.2*  --  5.0*  --  5.2*   Liver Function Tests:  Recent Labs  05/25/15 1515 10/20/15 1638  10/22/15 1107 10/25/15 1235 10/27/15 0825  AST 31 35  --   --   --   --   ALT 29 27  --   --   --   --   ALKPHOS 85 97  --   --   --   --   BILITOT 1.6* 1.8*  --   --   --   --   PROT 7.9 8.2*  --   --   --   --   ALBUMIN 3.7 3.9  < > 3.0* 2.7* 2.7*  < > = values in this interval not displayed. No results for input(s): LIPASE, AMYLASE in  the last 8760 hours. No results for input(s): AMMONIA in the last 8760 hours. CBC:  Recent Labs  05/25/15 1515  10/20/15 1638  10/22/15 1107 10/25/15 1236 10/27/15 10/27/15 0824  WBC 9.8  < > 9.7  < > 7.8 7.1 5.2 5.2  NEUTROABS 5.7  --  7.9*  --   --   --   --   --   HGB 14.4  < > 13.5  < > 11.4* 11.5*  --  11.1*  HCT 42.4  < > 43.2  < > 35.7* 34.1*  --  33.6*  MCV 89.3  < > 95.4  < > 93.2 90.5  --  91.6  PLT 160  < > 220  < > 187 198  --  220  < > = values in this interval not displayed. Cardiac Enzymes: No results for input(s): CKTOTAL, CKMB, CKMBINDEX, TROPONINI in the last 8760 hours. BNP: Invalid input(s): POCBNP CBG:  Recent Labs  05/25/15  2112 05/27/15 0825  GLUCAP 124* 82    Radiological Exams: X-ray Chest Pa And Lateral  10/20/2015  CLINICAL DATA:  Cough progressively getting worse. EXAM: CHEST  2 VIEW COMPARISON:  08/03/2014 FINDINGS: Lungs are hypoinflated with a 3.1 cm masslike opacity over the right upper lobe. Mild prominence of the perihilar markings. No evidence of effusion. Cardiomediastinal silhouette is within normal. There is calcified plaque over the aortic arch. There are degenerative changes of the spine with curvature of the thoracic spine convex right unchanged. Degenerative change of the left shoulder. Surgical clips are present over the neck base. IMPRESSION: No acute cardiopulmonary disease per 3.1 cm masslike opacity over the right upper lobe. Recommend chest CT for further evaluation to exclude malignancy. Electronically Signed   By: Marin Olp M.D.   On: 10/20/2015 17:50   Ct Chest W Contrast  10/22/2015  CLINICAL DATA:  Masslike opacity in the right upper lung on recent chest radiograph. EXAM: CT CHEST WITH CONTRAST TECHNIQUE: Multidetector CT imaging of the chest was performed during intravenous contrast administration. CONTRAST:  17m OMNIPAQUE IOHEXOL 300 MG/ML  SOLN COMPARISON:  10/20/2015 chest radiograph. FINDINGS: Mediastinum/Nodes: Normal  heart size. No pericardial fluid/thickening. Left main, left anterior descending, left circumflex and right coronary atherosclerosis. Great vessels are normal in course and caliber. No central pulmonary emboli. Thyroid appears surgically absent. Normal esophagus. No axillary adenopathy. Mildly enlarged 1.2 cm subcarinal node (series 2/ image 24). Mildly enlarged 1.1 cm AP window node (series 2/ image 19). Mildly enlarged 1.0 cm right hilar node (series 2/ image 24). No left hilar adenopathy. Lungs/Pleura: No pneumothorax. No pleural effusion. There is a 4.4 x 2.8 cm solid lung mass in the peripheral apical right upper lobe (series 3/ image 11), which abuts the visceral pleural along a broad base. There is material occluding the right upper lobe bronchus. There is patchy peribronchovascular ground-glass opacity throughout the central right upper lobe. There are 2 subpleural anterior right middle lobe pulmonary nodules, largest 3 mm (series 3/image 38). There is a 5 mm apical left upper lobe pulmonary nodule (series 3/image 10). Mild centrilobular emphysema and diffuse bronchial wall thickening. Subsegmental atelectasis in the dependent lower lobes. Upper abdomen: Atrophic appearing kidneys.  Cholecystectomy. Musculoskeletal: No aggressive appearing focal osseous lesions. Mild-to-moderate degenerative changes in the thoracic spine. IMPRESSION: 1. Peripheral apical right upper lobe 4.4 cm lung mass, which abuts the visceral pleural, suspicious for a primary bronchogenic carcinoma. 2. Right hilar and subcarinal and AP window mediastinal lymphadenopathy, suspicious for nodal metastases. 3. Occlusion of right mainstem bronchus by nonspecific material, possibly tumor and/or mucous plugging. Patchy peribronchovascular ground-glass opacity throughout the central right upper lobe, probably postobstructive pneumonitis. 4. Left upper lobe 5 mm pulmonary nodule, indeterminate. Tiny subpleural right middle lobe pulmonary nodules  are probably benign. 5. Additional findings include coronary atherosclerosis and mild emphysema. Electronically Signed   By: JIlona SorrelM.D.   On: 10/22/2015 09:10    Assessment/Plan  Physical deconditioning Will have her work with physical therapy and occupational therapy team to help with gait training and muscle strengthening exercises.fall precautions. Skin care. Encourage to be out of bed.   Acute bronchitis Completed antibiotics. Continue her proventil breathing treatment and dulera.   Squamous cell cancer of lung Reviewed lung biopsy and cytology report. Has upcoming appointment with oncology on 11/03/15 for further evaluation and discussion of treatment. She is also pending a PET scan  Constipation Change her lactulose to 20 g/30 cc daily from every other day and  monitor. Continue colace 100 mg bid.   Dysuria Send u/a with c/s. Monitor clinically. Hydration to be maintained  Depression and anxiety Get psychotherapy consult. Continue klonopin, seroquel and cymbalta  Tobacco use counselled on smoking cessation, offered nicotine patch- patient refuses.   ESRD On HD 3 days a week. Monitor clinically. Continue renvela and hectoro  HTN Stable bp, monitor, continue amlodipine 5 mg daily  HLD Continue lipitor  Anemia of chronic disease With CKD. Continue weekly aranesp.  gerd Stable, continue her famotidine  Chronic low back pain Continue norco 5-325 mg q6h prn pain. Continue flexeril prn muscle spasm  Emphysema Continue singulair, dulera and prn proventil     Goals of care: short term rehabilitation   Labs/tests ordered: cbc, cmp, u/a with c/s  Family/ staff Communication: reviewed care plan with patient and nursing supervisor    Blanchie Serve, MD Internal Medicine [place], [place] [address] Cell Phone (Monday-Friday 8 am - 5 pm): [phone] On Call: [phone] and follow prompts after 5  pm and on weekends Office Phone: [phone] Office Fax: [phone]

## 2015-10-31 NOTE — Telephone Encounter (Signed)
Oncology Nurse Navigator Documentation  Oncology Nurse Navigator Flowsheets 10/31/2015  Navigator Encounter Type Introductory phone call  Treatment Phase Pre-Tx/Tx Discussion  Barriers/Navigation Needs Coordination of Care  Interventions Coordination of Care  Coordination of Care Appts  Acuity Level 1  Acuity Level 1 Initial guidance, education and coordination as needed  Time Spent with Patient 15   I received a referral today on [name].  I called and scheduled her to be seen at [facility] on 11/03/15 arrive at 12:30.  She verbalized understanding of appt time and place.

## 2015-11-02 ENCOUNTER — Non-Acute Institutional Stay (SKILLED_NURSING_FACILITY): Payer: Medicare Other | Admitting: Family

## 2015-11-02 ENCOUNTER — Encounter: Payer: Self-pay | Admitting: *Deleted

## 2015-11-02 ENCOUNTER — Encounter: Payer: Self-pay | Admitting: Family

## 2015-11-02 DIAGNOSIS — R110 Nausea: Secondary | ICD-10-CM | POA: Diagnosis not present

## 2015-11-02 DIAGNOSIS — I10 Essential (primary) hypertension: Secondary | ICD-10-CM | POA: Diagnosis not present

## 2015-11-02 DIAGNOSIS — Z79899 Other long term (current) drug therapy: Secondary | ICD-10-CM | POA: Diagnosis not present

## 2015-11-02 NOTE — Progress Notes (Signed)
Location:  [facility] Room Number: 633 Place of Service:  SNF [phone]) Provider: Blanchie Serve, MD   Cathlean Cower, MD  Patient Care Team: Biagio Borg, MD as PCP - General Donato Heinz, MD as Consulting Physician (Nephrology) Dixie Dials, MD as Consulting Physician (Cardiology) Trula Slade, DPM as Consulting Physician (Podiatry) [facility]  Extended Emergency Contact Information Primary Emergency Contact: [place] Mobile Phone: [phone] Relation: Niece Secondary Emergency Contact: [place],Cel[place] Address: [address]          [place], [place] [address] [place] Phone: [phone] Mobile Phone: [phone] Relation: Friend  Code Status:  FullCode Goals of care: Advanced Directive information Advanced Directives 11/02/2015  Does patient have an advance directive? Yes  Type of Advance Directive Living will  Does patient want to make changes to advanced directive? No - Patient declined  Copy of advanced directive(s) in chart? Yes     Chief Complaint  Patient presents with  . Acute Visit    HPI:  Pt is a 75 y.o. female seen today at [facility]  for an acute visit for evaluation of nausea and recent low B/P. She has a medical history of HTN, CKD on dialysis, Depression, anxiety, RLS, Neuropathy among others. She is seen in her room today. She complains of nausea states current dose of Zofran ineffective. She states was taking Zofran 8 mg Tablet at home. Facility staff reports B/p was 83/48 overnight. Patient's B/p within normal range.    Past Medical History  Diagnosis Date  . HYPOTHYROIDISM 02/17/2007    s/p surgical removal of goiter in 1997  . HYPERLIPIDEMIA 02/17/2007  . GOUT 05/29/2007  . ANEMIA-NOS 05/29/2007  . ANXIETY 03/23/2010  . 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 DEPRESSION 02/17/2007  . RESTLESS LEG SYNDROME 05/29/2007  .  PERIPHERAL NEUROPATHY 05/29/2007  . HYPERTENSION 02/17/2007  . PERIPHERAL VASCULAR DISEASE 02/17/2007  . PEPTIC ULCER DISEASE 05/29/2007    "when I was in college"  . OSTEOPENIA 09/22/2009  . SEIZURE DISORDER 02/17/2007  . Memory loss 01/24/2010  . Personal history of colonic polyps 11/16/2009  . Palpitations 09/08/2010  . Vocal cord paralysis 1996  . 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 Chronic neck pain 12/13/2010  . GERD (gastroesophageal reflux disease)   . Critical lower limb ischemia   . ESRD on hemodialysis ([place]) 02/17/2007    Started dialysis April 2014.  Gets HD at [facility] on TTS schedule.  Cause of ESRD was HTN.     . Cancer of kidney ([place]) 10/07/2012    Followed per Dr Despina Pole, MD, urology, [place]   . ESRD (end stage renal disease) on dialysis ([place])     "TTS. [address]" (10/20/2015)  . 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 Heart murmur 10/02/2010    hx  . Chronic bronchitis ([place])   . Type II diabetes mellitus ([place]) 02/17/2007    "haven't had it since ~ 2010" (10/20/2015)  . COMMON MIGRAINE     "stress common migraines"  . Arthritis     "all over my body"  . Chronic lower back pain   . Renal insufficiency    Past Surgical History  Procedure Laterality Date  . Toe amputation Left 2006  . Bunionectomy Bilateral 1980  . Thyroid surgery  1997    goiter removal  . Stress cardiolite  06/18/2006  . Tranthoracic echocardiogram  06/18/2006  . Electrocardiogram  05/29/2006  . Shoulder open rotator cuff repair Left     Dr. Sharol Given  . Av fistula placement  03/13/2012    Procedure: ARTERIOVENOUS (AV) FISTULA CREATION;  Surgeon: Conrad West Logan, MD;  Location: [place];  Service: Vascular;  Laterality: Right;  . Cataract extraction, bilateral Bilateral     bilateral cataract removal  . Esophagoscopy w/ botox injection  07/22/2012    Procedure: ESOPHAGOSCOPY WITH BOTOX INJECTION;  Surgeon: Rozetta Nunnery, MD;  Location: [place];  Service: ENT;  Laterality: N/A;  esophageal dilation  . Insertion of dialysis catheter N/A 02/05/2013    Procedure: INSERTION OF DIALYSIS CATHETER;  Surgeon: Angelia Mould, MD;  Location: [place];  Service: Vascular;  Laterality: N/A;  Ultrasound guided  . Toe amputation Right Aug. 2015    2nd   . Laparoscopic cholecystectomy    . Video bronchoscopy Bilateral 10/25/2015    Procedure: VIDEO BRONCHOSCOPY WITH FLUORO;  Surgeon: Rigoberto Noel, MD;  Location: [place];  Service: Cardiopulmonary;  Laterality: Bilateral;    Allergies  Allergen Reactions  . Cephalosporins Itching and Rash    Vanc and fortaz given at the same time in June for several doses at dialysis with systemic rash and itching; received zinacef 7/5 and had worseningsystemicrash/ itching and swelling of eyes - so unclear if allergic to either or both-  Pt denies this  . Nsaids     REACTION: renal dysfunction  . Pioglitazone Swelling    Actos REACTION: EDEMA  . Vancomycin Rash    See comment under cephalosporin      Medication List       This list is accurate as of: 11/02/15  1:48 PM.  Always use your most recent med list.               acetaminophen 325 MG tablet  Commonly known as:  TYLENOL  Take 2 tablets (650 mg total) by mouth every 6 (six) hours as needed for mild pain (or Fever >/= 101).     albuterol 108 (90 Base) MCG/ACT inhaler  Commonly known as:  PROVENTIL HFA;VENTOLIN HFA  Inhale 2 puffs into the lungs every 6 (six) hours as needed for wheezing or shortness of breath.     allopurinol 100 MG tablet  Commonly known as:  ZYLOPRIM  TAKE ONE TABLET BY MOUTH ONCE DAILY     amLODipine 5 MG tablet  Commonly known as:  NORVASC  Take 5 mg by mouth daily.     atorvastatin 40 MG tablet  Commonly known as:  LIPITOR  TAKE ONE TABLET BY MOUTH ONCE DAILY     benzonatate 100 MG capsule  Commonly known as:  TESSALON  Take 100-200 mg by mouth every 6 (six) hours as needed for cough.      clonazePAM 1 MG tablet  Commonly known as:  KLONOPIN  Take 1 tablet (1 mg total) by mouth daily.     colchicine 0.6 MG tablet  Take 0.5 tablets (0.3 mg total) by mouth 2 (two) times a week.     cyclobenzaprine 10 MG tablet  Commonly known as:  FLEXERIL  TAKE ONE TABLET BY MOUTH THREE TIMES DAILY AS NEEDED FOR MUSCLE SPASM     darbepoetin 25 MCG/0.42ML Soln injection  Commonly known as:  ARANESP  Inject 25 mcg into the vein every 7 (seven) days. Every Thursday at dialysis     docusate sodium  100 MG capsule  Commonly known as:  COLACE  Take 100 mg by mouth 2 (two) times daily.     doxercalciferol 4 MCG/2ML injection  Commonly known as:  HECTOROL  Inject 1 mL (2 mcg total) into the vein Every Tuesday,Thursday,and Saturday with dialysis.     DULoxetine 30 MG capsule  Commonly known as:  CYMBALTA  Take 2 capsules (60 mg total) by mouth daily.     famotidine 40 MG tablet  Commonly known as:  PEPCID  Take 1 tablet (40 mg total) by mouth daily.     HYDROcodone-acetaminophen 5-325 MG tablet  Commonly known as:  NORCO/VICODIN  Take 1 tablet by mouth every 6 (six) hours as needed for severe pain.     hydrOXYzine 25 MG tablet  Commonly known as:  ATARAX/VISTARIL  TAKE ONE TABLET BY MOUTH EVERY 6 HOURS AS NEEDED FOR ANXIETY.     lactulose 10 GM/15ML solution  Commonly known as:  CHRONULAC  Take 30 mLs (20 g total) by mouth every other day.     levothyroxine 175 MCG tablet  Commonly known as:  SYNTHROID, LEVOTHROID  Take 1 tablet (175 mcg total) by mouth daily before breakfast.     mometasone-formoterol 100-5 MCG/ACT Aero  Commonly known as:  DULERA  INHALE TWO PUFFS INTO LUNGS TWICE DAILY     montelukast 10 MG tablet  Commonly known as:  SINGULAIR  TAKE ONE TABLET BY MOUTH AT BEDTIME     multivitamin Tabs tablet  Take 1 tablet by mouth daily.     ondansetron 4 MG tablet  Commonly known as:  ZOFRAN  Take 4 mg by mouth every 6 (six) hours as needed for nausea or  vomiting.     pregabalin 50 MG capsule  Commonly known as:  LYRICA  Take 50 mg by mouth daily.     QUEtiapine 100 MG tablet  Commonly known as:  SEROQUEL  TAKE ONE TABLET BY MOUTH AT BEDTIME     sevelamer carbonate 800 MG tablet  Commonly known as:  RENVELA  Take 3,200 mg by mouth 3 (three) times daily with meals. 4 tablets with each meal and 3 tablets with each snack. ( pt takes 7 times daily)        Review of Systems  Constitutional: Negative for fever, chills, activity change, appetite change and fatigue.  HENT: Negative for congestion, rhinorrhea, sinus pressure, sneezing and sore throat.   Eyes: Negative.   Respiratory: Negative for cough, chest tightness, shortness of breath and wheezing.   Cardiovascular: Negative for chest pain, palpitations and leg swelling.  Gastrointestinal: Positive for nausea. Negative for abdominal pain, diarrhea and abdominal distention.  Endocrine: Negative.   Genitourinary:       Hemodialysis Jacob Moores, Sat   Skin: Negative.   Neurological: Negative.   Psychiatric/Behavioral: Negative.     Immunization History  Administered Date(s) Administered  . Influenza Split 04/24/2012  . Influenza Whole 04/25/2007, 05/06/2010  . Influenza, High Dose Seasonal PF 06/15/2015  . Influenza-Unspecified 05/18/2014  . PPD Test 10/28/2015  . Pneumococcal Conjugate-13 10/14/2013  . Pneumococcal Polysaccharide-23 04/25/2007  . Td 09/15/2008   Pertinent  Health Maintenance Due  Topic Date Due  . URINE MICROALBUMIN  12/12/1950  . DEXA SCAN  12/12/2004  . INFLUENZA VACCINE  03/06/2016  . COLONOSCOPY  01/09/2018  . PNA vac Low Risk Adult  Completed   Fall Risk  03/01/2015  Falls in the past year? No   Functional Status Survey:  Filed Vitals:   11/02/15 1213  BP: 137/69  Pulse: 80  Temp: 98.2 F (36.8 C)  TempSrc: Oral  Resp: 20  Height: '5\' 5"'$  (1.651 m)  Weight: 151 lb (68.493 kg)   Body mass index is 25.13 kg/(m^2). Physical Exam    Constitutional: She is oriented to person, place, and time. No distress.  Thin Frail Elderly   HENT:  Head: Normocephalic.  Mouth/Throat: Oropharynx is clear and moist.  Eyes: Conjunctivae and EOM are normal. Pupils are equal, round, and reactive to light. Right eye exhibits no discharge. Left eye exhibits no discharge. No scleral icterus.  Neck: Normal range of motion. No JVD present.  Cardiovascular: Normal rate, regular rhythm, normal heart sounds and intact distal pulses.  Exam reveals no gallop and no friction rub.   No murmur heard. Pulmonary/Chest: Effort normal and breath sounds normal. No respiratory distress. She has no wheezes. She has no rales.  Abdominal: Soft. Bowel sounds are normal. She exhibits no distension and no mass. There is no tenderness. There is no rebound and no guarding.  Musculoskeletal: Normal range of motion. She exhibits no edema or tenderness.  Lymphadenopathy:    She has no cervical adenopathy.  Neurological: She is oriented to person, place, and time.  Skin: Skin is warm and dry. No rash noted. No erythema. No pallor.  Psychiatric: She has a normal mood and affect.    Labs reviewed:  Recent Labs  10/22/15 1107 10/23/15 0523 10/25/15 1235 10/27/15 10/27/15 0825  NA 133* 136 135 139 139  K 5.1 4.3 4.6  --  4.6  CL 92* 95* 96*  --  97*  CO2 '23 27 22  '$ --  27  GLUCOSE 130* 104* 172*  --  76  BUN 38* 16 45* 28* 28*  CREATININE 7.26* 4.59* 8.44* 7.4* 7.40*  CALCIUM 8.5* 8.7* 8.5*  --  8.7*  PHOS 5.2*  --  5.0*  --  5.2*    Recent Labs  05/25/15 1515 10/20/15 1638  10/22/15 1107 10/25/15 1235 10/27/15 0825  AST 31 35  --   --   --   --   ALT 29 27  --   --   --   --   ALKPHOS 85 97  --   --   --   --   BILITOT 1.6* 1.8*  --   --   --   --   PROT 7.9 8.2*  --   --   --   --   ALBUMIN 3.7 3.9  < > 3.0* 2.7* 2.7*  < > = values in this interval not displayed.  Recent Labs  05/25/15 1515  10/20/15 1638  10/22/15 1107 10/25/15 1236  10/27/15 10/27/15 0824  WBC 9.8  < > 9.7  < > 7.8 7.1 5.2 5.2  NEUTROABS 5.7  --  7.9*  --   --   --   --   --   HGB 14.4  < > 13.5  < > 11.4* 11.5*  --  11.1*  HCT 42.4  < > 43.2  < > 35.7* 34.1*  --  33.6*  MCV 89.3  < > 95.4  < > 93.2 90.5  --  91.6  PLT 160  < > 220  < > 187 198  --  220  < > = values in this interval not displayed. Lab Results  Component Value Date   TSH 1.166 05/26/2015   Lab Results  Component Value Date  HGBA1C 5.0 03/01/2015   Lab Results  Component Value Date   CHOL 139 03/01/2015   HDL 61.90 03/01/2015   LDLCALC 42 03/01/2015   LDLDIRECT 104.4 08/21/2011   TRIG 178.0* 03/01/2015   CHOLHDL 2 03/01/2015    Significant Diagnostic Results in last 30 days:  X-ray Chest Pa And Lateral  10/20/2015  CLINICAL DATA:  Cough progressively getting worse. EXAM: CHEST  2 VIEW COMPARISON:  08/03/2014 FINDINGS: Lungs are hypoinflated with a 3.1 cm masslike opacity over the right upper lobe. Mild prominence of the perihilar markings. No evidence of effusion. Cardiomediastinal silhouette is within normal. There is calcified plaque over the aortic arch. There are degenerative changes of the spine with curvature of the thoracic spine convex right unchanged. Degenerative change of the left shoulder. Surgical clips are present over the neck base. IMPRESSION: No acute cardiopulmonary disease per 3.1 cm masslike opacity over the right upper lobe. Recommend chest CT for further evaluation to exclude malignancy. Electronically Signed   By: Marin Olp M.D.   On: 10/20/2015 17:50   Ct Chest W Contrast  10/22/2015  CLINICAL DATA:  Masslike opacity in the right upper lung on recent chest radiograph. EXAM: CT CHEST WITH CONTRAST TECHNIQUE: Multidetector CT imaging of the chest was performed during intravenous contrast administration. CONTRAST:  6m OMNIPAQUE IOHEXOL 300 MG/ML  SOLN COMPARISON:  10/20/2015 chest radiograph. FINDINGS: Mediastinum/Nodes: Normal heart size. No  pericardial fluid/thickening. Left main, left anterior descending, left circumflex and right coronary atherosclerosis. Great vessels are normal in course and caliber. No central pulmonary emboli. Thyroid appears surgically absent. Normal esophagus. No axillary adenopathy. Mildly enlarged 1.2 cm subcarinal node (series 2/ image 24). Mildly enlarged 1.1 cm AP window node (series 2/ image 19). Mildly enlarged 1.0 cm right hilar node (series 2/ image 24). No left hilar adenopathy. Lungs/Pleura: No pneumothorax. No pleural effusion. There is a 4.4 x 2.8 cm solid lung mass in the peripheral apical right upper lobe (series 3/ image 11), which abuts the visceral pleural along a broad base. There is material occluding the right upper lobe bronchus. There is patchy peribronchovascular ground-glass opacity throughout the central right upper lobe. There are 2 subpleural anterior right middle lobe pulmonary nodules, largest 3 mm (series 3/image 38). There is a 5 mm apical left upper lobe pulmonary nodule (series 3/image 10). Mild centrilobular emphysema and diffuse bronchial wall thickening. Subsegmental atelectasis in the dependent lower lobes. Upper abdomen: Atrophic appearing kidneys.  Cholecystectomy. Musculoskeletal: No aggressive appearing focal osseous lesions. Mild-to-moderate degenerative changes in the thoracic spine. IMPRESSION: 1. Peripheral apical right upper lobe 4.4 cm lung mass, which abuts the visceral pleural, suspicious for a primary bronchogenic carcinoma. 2. Right hilar and subcarinal and AP window mediastinal lymphadenopathy, suspicious for nodal metastases. 3. Occlusion of right mainstem bronchus by nonspecific material, possibly tumor and/or mucous plugging. Patchy peribronchovascular ground-glass opacity throughout the central right upper lobe, probably postobstructive pneumonitis. 4. Left upper lobe 5 mm pulmonary nodule, indeterminate. Tiny subpleural right middle lobe pulmonary nodules are probably  benign. 5. Additional findings include coronary atherosclerosis and mild emphysema. Electronically Signed   By: JIlona SorrelM.D.   On: 10/22/2015 09:10    Assessment/Plan Nausea without vomiting:  Reports current zofran dose ineffective was on 8 mg Tablet at home. Will change Zofran to 8 mg Tablet every 6 Hrs PRN Continue to monitor.  HTN: Hypotension reported overnight by facility nurse. B/p within target goal range for dialysis. Continue on Amlodipine. Continue to monitor. Medication Management: Request  medication administered early prior to dialysis and to be able to keep  Renvela at bedside and self administered on time.Discussed with Pharmacist, hospital. Patient Alert and oriented able to self administer medication. May self administered Renvela as directed from dialysis.     Family/ staff Communication: Reviewed plan of care with patient and Facility Nurse supervisor.   Labs/tests ordered:  None

## 2015-11-02 NOTE — Progress Notes (Signed)
Oncology Nurse Navigator Documentation  Oncology Nurse Navigator Flowsheets 11/02/2015  Navigator Encounter Type Telephone  Telephone Outgoing Call  Treatment Phase Pre-Tx/Tx Discussion  Barriers/Navigation Needs Transportation  Acuity Level 1  Acuity Level 1 Minimal follow up required  Time Spent with Patient 30   I noticed patient is at [facility].  I called and left vm message for the nurse and updated her with appt for [place] tomorrow.  I also left my name and phone number to call.

## 2015-11-03 ENCOUNTER — Encounter: Payer: Self-pay | Admitting: *Deleted

## 2015-11-03 ENCOUNTER — Other Ambulatory Visit (HOSPITAL_BASED_OUTPATIENT_CLINIC_OR_DEPARTMENT_OTHER): Payer: Medicare Other

## 2015-11-03 ENCOUNTER — Ambulatory Visit
Admission: RE | Admit: 2015-11-03 | Discharge: 2015-11-03 | Disposition: A | Discharge status: Home / Self Care | Payer: Medicare Other | Source: Ambulatory Visit | Attending: Radiation Oncology | Admitting: Radiation Oncology

## 2015-11-03 ENCOUNTER — Encounter: Payer: Self-pay | Admitting: Internal Medicine

## 2015-11-03 ENCOUNTER — Ambulatory Visit (HOSPITAL_BASED_OUTPATIENT_CLINIC_OR_DEPARTMENT_OTHER): Payer: Medicare Other | Admitting: Internal Medicine

## 2015-11-03 ENCOUNTER — Telehealth: Payer: Self-pay | Admitting: Internal Medicine

## 2015-11-03 ENCOUNTER — Telehealth: Payer: Self-pay | Admitting: *Deleted

## 2015-11-03 ENCOUNTER — Ambulatory Visit: Payer: Medicare Other | Admitting: Physical Therapy

## 2015-11-03 VITALS — BP 132/67 | HR 90 | Temp 98.8°F | Resp 18 | Ht 65.0 in | Wt 154.2 lb

## 2015-11-03 DIAGNOSIS — C3491 Malignant neoplasm of unspecified part of right bronchus or lung: Secondary | ICD-10-CM

## 2015-11-03 DIAGNOSIS — C3411 Malignant neoplasm of upper lobe, right bronchus or lung: Secondary | ICD-10-CM

## 2015-11-03 LAB — COMPREHENSIVE METABOLIC PANEL
ALT: 33 U/L (ref 0–55)
AST: 24 U/L (ref 5–34)
Albumin: 4 g/dL (ref 3.5–5.0)
Alkaline Phosphatase: 100 U/L (ref 40–150)
Anion Gap: 11 mEq/L (ref 3–11)
BILIRUBIN TOTAL: 0.32 mg/dL (ref 0.20–1.20)
BUN: 14.3 mg/dL (ref 7.0–26.0)
CHLORIDE: 99 meq/L (ref 98–109)
CO2: 29 meq/L (ref 22–29)
CREATININE: 3.6 mg/dL — AB (ref 0.6–1.1)
Calcium: 9.7 mg/dL (ref 8.4–10.4)
EGFR: 13 mL/min/{1.73_m2} — ABNORMAL LOW (ref >90)
GLUCOSE: 60 mg/dL — AB (ref 70–140)
Potassium: 4.3 mEq/L (ref 3.5–5.1)
SODIUM: 139 meq/L (ref 136–145)
TOTAL PROTEIN: 8.9 g/dL — AB (ref 6.4–8.3)

## 2015-11-03 LAB — CBC WITH DIFFERENTIAL/PLATELET
BASO%: 0.6 % (ref 0.0–2.0)
Basophils Absolute: 0 10*3/uL (ref 0.0–0.1)
EOS ABS: 0.4 10*3/uL (ref 0.0–0.5)
EOS%: 5.5 % (ref 0.0–7.0)
HCT: 40 % (ref 34.8–46.6)
HGB: 12.6 g/dL (ref 11.6–15.9)
LYMPH%: 34.3 % (ref 14.0–49.7)
MCH: 29.7 pg (ref 25.1–34.0)
MCHC: 31.5 g/dL (ref 31.5–36.0)
MCV: 94.3 fL (ref 79.5–101.0)
MONO#: 0.5 10*3/uL (ref 0.1–0.9)
MONO%: 7.4 % (ref 0.0–14.0)
NEUT#: 3.8 10*3/uL (ref 1.5–6.5)
NEUT%: 52.2 % (ref 38.4–76.8)
PLATELETS: 261 10*3/uL (ref 145–400)
RBC: 4.24 10*6/uL (ref 3.70–5.45)
RDW: 16.4 % — ABNORMAL HIGH (ref 11.2–14.5)
WBC: 7.3 10*3/uL (ref 3.9–10.3)
lymph#: 2.5 10*3/uL (ref 0.9–3.3)

## 2015-11-03 NOTE — Progress Notes (Signed)
[place] Clinical Social Work  Clinical Social Work met with patient/family at [employer] appointment to offer support and assess for psychosocial needs.  The patient was accompanied by her nephew, who lives in [place].  The patient is currently receiving rehab at [facility].  Ms. Santillana reported feeling scared based on "not knowing what is next". Reported her main concern is, "I don't want to die yet".  CSW discussed concept of facing mortality when being diagnosed with cancer.  The patient indicated she is seeing a psychiatrist at [facility].    Clinical Social Work briefly discussed Clinical Social Work role and [employer] support programs/services.  Clinical Social Work encouraged patient to call with any additional questions or concerns.   Polo Riley, MSW, LCSW, OSW-C Clinical Social Worker [facility] [phone]

## 2015-11-03 NOTE — Progress Notes (Signed)
[place] Telephone:([phone]) [phone]   Fax:([phone]) [phone] Multidisciplinary thoracic oncology clinic  CONSULT NOTE  REFERRING PHYSICIAN: Dr. Kara [facility] REASON FOR CONSULTATION:  75 years old African-American female recently diagnosed with lung cancer.  HPI [name] [name] is a 75 y.o. female was past medical history significant for multiple medical problems including history of end stage renal disease and currently on hemodialysis on Tuesday, Thursday and Saturday. The patient also has a history of hypertension, hypothyroidism, dyslipidemia, gout, anxiety/depression, neuropathy of the right foot, GERD, osteoarthritis as well as chronic back pain and diabetes mellitus. She also has a long history of smoking. The patient mentions that she had flulike symptoms for several weeks with dry cough and pain in the back and side from the frequent cough. She was seen by her primary care physician and chest x-ray was performed on 10/20/2015 and it showed no acute cardiopulmonary disease but there was 3.1 cm masslike opacity over the right upper lobe. This was followed by CT scan of the chest with contrast on 10/22/2015 and it showed bilateral apical right upper lobe 4.4 cm lung mass which abuts the visceral pleura suspicious for primary bronchogenic carcinoma. There was also right hilar and subcarinal as well as AP window mediastinal lymphadenopathy suspicious for nodal metastasis. There was occlusion of the right mainstem bronchus by nonspecific material questionable to be tumor versus mucous plugging. The scan also showed left upper lobe 0.5 cm pulmonary nodule that was indeterminate. The patient was seen by Dr. Elsworth Soho during her hospitalization and she underwent bronchoscopy with biopsy of the right mainstem and right upper lobe mass. The final pathology (Accession: [id number]) was consistent with squamous cell carcinoma. Dr. Drucilla Chalet referred the patient to me today for further evaluation and  recommendation regarding treatment of her condition. When seen today the patient was very anxious and continues to have cough productive of brownish sputum but no significant chest pain, shortness breath or hemoptysis. She had mild nausea with no vomiting. She has no diarrhea or constipation. She denied having any significant weight loss or night sweats. The patient denied having any headache or visual changes. Family history significant for mother with Alzheimer, father had pneumonia and brother had lung cancer. The patient is single and has no children. She was accompanied today by her nephew Mase. She used to work as an Banker. She has a history of smoking 2 pack per day for around 45 years and she continues to smoke. No history of alcohol or drug abuse.  HPI  Past Medical History  Diagnosis Date  . HYPOTHYROIDISM 02/17/2007    s/p surgical removal of goiter in 1997  . HYPERLIPIDEMIA 02/17/2007  . GOUT 05/29/2007  . ANEMIA-NOS 05/29/2007  . ANXIETY 03/23/2010  . 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 DEPRESSION 02/17/2007  . RESTLESS LEG SYNDROME 05/29/2007  . PERIPHERAL NEUROPATHY 05/29/2007  . HYPERTENSION 02/17/2007  . PERIPHERAL VASCULAR DISEASE 02/17/2007  . PEPTIC ULCER DISEASE 05/29/2007    "when I was in college"  . OSTEOPENIA 09/22/2009  . SEIZURE DISORDER 02/17/2007  . Memory loss 01/24/2010  . Personal history of colonic polyps 11/16/2009  . Palpitations 09/08/2010  . Vocal cord paralysis 1996  . 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 Chronic neck pain 12/13/2010  . GERD (gastroesophageal reflux disease)   . Critical lower limb ischemia   . ESRD on hemodialysis ([place]) 02/17/2007    Started dialysis April 2014.  Gets HD at [facility] on TTS schedule.  Cause of ESRD  was HTN.     . Cancer of kidney ([place]) 10/07/2012    Followed per Dr Despina Pole, MD, urology, [place]   . ESRD (end stage renal disease) on dialysis ([place])     "TTS. [address]" (10/20/2015)  . 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 Heart murmur 10/02/2010    hx  . Chronic bronchitis ([place])   . Type II diabetes mellitus ([place]) 02/17/2007    "haven't had it since ~ 2010" (10/20/2015)  . COMMON MIGRAINE     "stress common migraines"  . Arthritis     "all over my body"  . Chronic lower back pain   . Renal insufficiency     Past Surgical History  Procedure Laterality Date  . Toe amputation Left 2006  . Bunionectomy Bilateral 1980  . Thyroid surgery  1997    goiter removal  . Stress cardiolite  06/18/2006  . Tranthoracic echocardiogram  06/18/2006  . Electrocardiogram  05/29/2006  . Shoulder open rotator cuff repair Left     Dr. Sharol Given  . Av fistula placement  03/13/2012    Procedure: ARTERIOVENOUS (AV) FISTULA CREATION;  Surgeon: Conrad Irena, MD;  Location: [place];  Service: Vascular;  Laterality: Right;  . Cataract extraction, bilateral Bilateral     bilateral cataract removal  . Esophagoscopy w/ botox injection  07/22/2012    Procedure: ESOPHAGOSCOPY WITH BOTOX INJECTION;  Surgeon: Rozetta Nunnery, MD;  Location: [place];  Service: ENT;  Laterality: N/A;  esophageal dilation  . Insertion of dialysis catheter N/A 02/05/2013    Procedure: INSERTION OF DIALYSIS CATHETER;  Surgeon: Angelia Mould, MD;  Location: [facility];  Service: Vascular;  Laterality: N/A;  Ultrasound guided  . Toe amputation Right Aug. 2015    2nd   . Laparoscopic cholecystectomy    . Video bronchoscopy Bilateral 10/25/2015    Procedure: VIDEO BRONCHOSCOPY WITH FLUORO;  Surgeon: Rigoberto Noel, MD;  Location: [facility];  Service: Cardiopulmonary;  Laterality: Bilateral;    Family History  Problem Relation Age of Onset  . Dementia Mother   . Hypertension Mother   . Coronary artery disease Other   . Hyperlipidemia Other   . Hypertension Other   . Ovarian cancer Other   . Stroke Other   . Hypertension Sister   . Hypertension Brother   . Heart attack  Brother   . Stroke Brother     Social History Social History  Substance Use Topics  . Smoking status: Current Some Day Smoker -- 0.25 packs/day for 51 years    Types: Cigarettes  . Smokeless tobacco: Never Used     Comment: USING E CIG  . Alcohol Use: No    Allergies  Allergen Reactions  . Cephalosporins Itching and Rash    Vanc and fortaz given at the same time in June for several doses at dialysis with systemic rash and itching; received zinacef 7/5 and had worseningsystemicrash/ itching and swelling of eyes - so unclear if allergic to either or both-  Pt denies this  . Nsaids     REACTION: renal dysfunction  . Pioglitazone Swelling    Actos REACTION: EDEMA  . Vancomycin Rash    See comment under cephalosporin    Current Outpatient Prescriptions  Medication Sig Dispense Refill  . acetaminophen (TYLENOL) 325 MG tablet Take 2 tablets (650 mg total) by mouth every 6 (six) hours as needed for mild  pain (or Fever >/= 101).    Marland Kitchen albuterol (PROVENTIL HFA;VENTOLIN HFA) 108 (90 BASE) MCG/ACT inhaler Inhale 2 puffs into the lungs every 6 (six) hours as needed for wheezing or shortness of breath. 1 Inhaler 5  . allopurinol (ZYLOPRIM) 100 MG tablet TAKE ONE TABLET BY MOUTH ONCE DAILY 90 tablet 0  . amLODipine (NORVASC) 5 MG tablet Take 5 mg by mouth daily.    Marland Kitchen atorvastatin (LIPITOR) 40 MG tablet TAKE ONE TABLET BY MOUTH ONCE DAILY 30 tablet 5  . clonazePAM (KLONOPIN) 1 MG tablet Take 1 tablet (1 mg total) by mouth daily. 30 tablet 0  . colchicine 0.6 MG tablet Take 0.5 tablets (0.3 mg total) by mouth 2 (two) times a week. 5 tablet 0  . cyclobenzaprine (FLEXERIL) 10 MG tablet TAKE ONE TABLET BY MOUTH THREE TIMES DAILY AS NEEDED FOR MUSCLE SPASM 30 tablet 0  . darbepoetin (ARANESP) 25 MCG/0.42ML SOLN injection Inject 25 mcg into the vein every 7 (seven) days. Every Thursday at dialysis    . docusate sodium (COLACE) 100 MG capsule Take 100 mg by mouth 2 (two) times daily.     Marland Kitchen  doxercalciferol (HECTOROL) 4 MCG/2ML injection Inject 1 mL (2 mcg total) into the vein Every Tuesday,Thursday,and Saturday with dialysis.    . DULoxetine (CYMBALTA) 30 MG capsule Take 2 capsules (60 mg total) by mouth daily. 60 capsule 11  . famotidine (PEPCID) 40 MG tablet Take 1 tablet (40 mg total) by mouth daily. 60 tablet 0  . HYDROcodone-acetaminophen (NORCO/VICODIN) 5-325 MG per tablet Take 1 tablet by mouth every 6 (six) hours as needed for severe pain. 30 tablet 0  . hydrOXYzine (ATARAX/VISTARIL) 25 MG tablet TAKE ONE TABLET BY MOUTH EVERY 6 HOURS AS NEEDED FOR ANXIETY. 30 tablet 0  . lactulose (CHRONULAC) 10 GM/15ML solution Take 30 mLs (20 g total) by mouth every other day. 473 mL 3  . levothyroxine (SYNTHROID, LEVOTHROID) 175 MCG tablet Take 1 tablet (175 mcg total) by mouth daily before breakfast. 90 tablet 3  . mometasone-formoterol (DULERA) 100-5 MCG/ACT AERO INHALE TWO PUFFS INTO LUNGS TWICE DAILY 1 Inhaler 5  . montelukast (SINGULAIR) 10 MG tablet TAKE ONE TABLET BY MOUTH AT BEDTIME 30 tablet 5  . multivitamin (RENA-VIT) TABS tablet Take 1 tablet by mouth daily.    . ondansetron (ZOFRAN) 4 MG tablet Take 4 mg by mouth every 6 (six) hours as needed for nausea or vomiting.    . pregabalin (LYRICA) 50 MG capsule Take 50 mg by mouth daily.    . QUEtiapine (SEROQUEL) 100 MG tablet TAKE ONE TABLET BY MOUTH AT BEDTIME 90 tablet 0  . sevelamer carbonate (RENVELA) 800 MG tablet Take 3,200 mg by mouth 3 (three) times daily with meals. 4 tablets with each meal and 3 tablets with each snack. ( pt takes 7 times daily)    . benzonatate (TESSALON) 100 MG capsule Take 100-200 mg by mouth every 6 (six) hours as needed for cough. Reported on 11/03/2015    . cinacalcet (SENSIPAR) 30 MG tablet Take 30 mg by mouth daily.     No current facility-administered medications for this visit.    Review of Systems  Constitutional: positive for fatigue Eyes: negative Ears, nose, mouth, throat, and face:  negative Respiratory: positive for cough, sputum and wheezing Cardiovascular: negative Gastrointestinal: negative Genitourinary:negative Integument/breast: negative Hematologic/lymphatic: negative Musculoskeletal:negative Neurological: negative Behavioral/Psych: positive for anxiety Endocrine: negative Allergic/Immunologic: negative  Physical Exam  YSA:YTKZS, healthy, no distress, well nourished, well developed and  anxious SKIN: skin color, texture, turgor are normal, no rashes or significant lesions HEAD: Normocephalic, No masses, lesions, tenderness or abnormalities EYES: normal, PERRLA, Conjunctiva are pink and non-injected EARS: External ears normal, Canals clear OROPHARYNX:no exudate, no erythema and lips, buccal mucosa, and tongue normal  NECK: supple, no adenopathy, no JVD LYMPH:  no palpable lymphadenopathy, no hepatosplenomegaly BREAST:not examined LUNGS: expiratory wheezes bilaterally, scattered rhonchi bilaterally HEART: regular rate & rhythm and no murmurs ABDOMEN:abdomen soft, non-tender, normal bowel sounds and no masses or organomegaly BACK: Back symmetric, no curvature., No CVA tenderness EXTREMITIES:no joint deformities, effusion, or inflammation, no edema  NEURO: alert & oriented x 3 with fluent speech, no focal motor/sensory deficits  PERFORMANCE STATUS: ECOG 1  LABORATORY DATA: Lab Results  Component Value Date   WBC 7.3 11/03/2015   HGB 12.6 11/03/2015   HCT 40.0 11/03/2015   MCV 94.3 11/03/2015   PLT 261 11/03/2015      Chemistry      Component Value Date/Time   NA 139 11/03/2015 1215   NA 139 10/27/2015 0825   NA 139 10/27/2015   K 4.3 11/03/2015 1215   K 4.6 10/27/2015 0825   CL 97* 10/27/2015 0825   CO2 29 11/03/2015 1215   CO2 27 10/27/2015 0825   BUN 14.3 11/03/2015 1215   BUN 28* 10/27/2015 0825   BUN 28* 10/27/2015   CREATININE 3.6* 11/03/2015 1215   CREATININE 7.40* 10/27/2015 0825   CREATININE 7.4* 10/27/2015   GLU 76  10/27/2015      Component Value Date/Time   CALCIUM 9.7 11/03/2015 1215   CALCIUM 8.7* 10/27/2015 0825   ALKPHOS 100 11/03/2015 1215   ALKPHOS 97 10/20/2015 1638   AST 24 11/03/2015 1215   AST 35 10/20/2015 1638   ALT 33 11/03/2015 1215   ALT 27 10/20/2015 1638   BILITOT 0.32 11/03/2015 1215   BILITOT 1.8* 10/20/2015 1638       RADIOGRAPHIC STUDIES: X-ray Chest Pa And Lateral  10/20/2015  CLINICAL DATA:  Cough progressively getting worse. EXAM: CHEST  2 VIEW COMPARISON:  08/03/2014 FINDINGS: Lungs are hypoinflated with a 3.1 cm masslike opacity over the right upper lobe. Mild prominence of the perihilar markings. No evidence of effusion. Cardiomediastinal silhouette is within normal. There is calcified plaque over the aortic arch. There are degenerative changes of the spine with curvature of the thoracic spine convex right unchanged. Degenerative change of the left shoulder. Surgical clips are present over the neck base. IMPRESSION: No acute cardiopulmonary disease per 3.1 cm masslike opacity over the right upper lobe. Recommend chest CT for further evaluation to exclude malignancy. Electronically Signed   By: Marin Olp M.D.   On: 10/20/2015 17:50   Ct Chest W Contrast  10/22/2015  CLINICAL DATA:  Masslike opacity in the right upper lung on recent chest radiograph. EXAM: CT CHEST WITH CONTRAST TECHNIQUE: Multidetector CT imaging of the chest was performed during intravenous contrast administration. CONTRAST:  67m OMNIPAQUE IOHEXOL 300 MG/ML  SOLN COMPARISON:  10/20/2015 chest radiograph. FINDINGS: Mediastinum/Nodes: Normal heart size. No pericardial fluid/thickening. Left main, left anterior descending, left circumflex and right coronary atherosclerosis. Great vessels are normal in course and caliber. No central pulmonary emboli. Thyroid appears surgically absent. Normal esophagus. No axillary adenopathy. Mildly enlarged 1.2 cm subcarinal node (series 2/ image 24). Mildly enlarged 1.1 cm  AP window node (series 2/ image 19). Mildly enlarged 1.0 cm right hilar node (series 2/ image 24). No left hilar adenopathy. Lungs/Pleura: No pneumothorax. No pleural  effusion. There is a 4.4 x 2.8 cm solid lung mass in the peripheral apical right upper lobe (series 3/ image 11), which abuts the visceral pleural along a broad base. There is material occluding the right upper lobe bronchus. There is patchy peribronchovascular ground-glass opacity throughout the central right upper lobe. There are 2 subpleural anterior right middle lobe pulmonary nodules, largest 3 mm (series 3/image 38). There is a 5 mm apical left upper lobe pulmonary nodule (series 3/image 10). Mild centrilobular emphysema and diffuse bronchial wall thickening. Subsegmental atelectasis in the dependent lower lobes. Upper abdomen: Atrophic appearing kidneys.  Cholecystectomy. Musculoskeletal: No aggressive appearing focal osseous lesions. Mild-to-moderate degenerative changes in the thoracic spine. IMPRESSION: 1. Peripheral apical right upper lobe 4.4 cm lung mass, which abuts the visceral pleural, suspicious for a primary bronchogenic carcinoma. 2. Right hilar and subcarinal and AP window mediastinal lymphadenopathy, suspicious for nodal metastases. 3. Occlusion of right mainstem bronchus by nonspecific material, possibly tumor and/or mucous plugging. Patchy peribronchovascular ground-glass opacity throughout the central right upper lobe, probably postobstructive pneumonitis. 4. Left upper lobe 5 mm pulmonary nodule, indeterminate. Tiny subpleural right middle lobe pulmonary nodules are probably benign. 5. Additional findings include coronary atherosclerosis and mild emphysema. Electronically Signed   By: Ilona Sorrel M.D.   On: 10/22/2015 09:10    ASSESSMENT: This is a very pleasant 75 years old African-American female recently diagnosed with at least stage IIIB (T2a, N3, M0) non-small cell lung cancer, squamous cell carcinoma presented with  apical right upper lobe lung mass in addition to right hilar, subcarinal and AP window lymphadenopathy diagnosed in March 2017.   PLAN: I had a lengthy discussion with the patient and her nephew today about her current disease stage, prognosis and treatment options. The patient has very completely treated medical history with end stage renal disease and currently on hemodialysis. I recommended for her to complete the staging workup by ordering a PET scan in addition to MRI of the brain to rule out any metastatic disease. I will also ask pathology to send her tissue block for PDL 1 testing If the patient has no evidence for metastatic disease, she would be considered for a course of concurrent chemoradiation. I recommended for the patient a course of concurrent chemoradiation with weekly carboplatin for AUC of 2 and paclitaxel 45 MG/M2. I discussed with the patient adverse effect of the chemotherapy including but not limited to alopecia, myelosuppression, nausea and vomiting, peripheral neuropathy, liver or renal dysfunction. The patient will be seen later today by Dr. Sondra Come for evaluation and discussion of the radiotherapy option. I would see the patient back for follow-up visit in 2 weeks for reevaluation and more detailed discussion of her treatment options based on the final staging workup and molecular testing. The patient was seen during the multidisciplinary thoracic oncology clinic today by medical oncology, radiation oncology, thoracic navigator, social worker and physical therapist. She was advised to call immediately if she has any concerning symptoms in the interval.  The patient voices understanding of current disease status and treatment options and is in agreement with the current care plan.  All questions were answered. The patient knows to call the clinic with any problems, questions or concerns. We can certainly see the patient much sooner if necessary.  Thank you so much for  allowing me to participate in the care of [name] [name]. I will continue to follow up the patient with you and assist in her care.  I spent 40 minutes counseling  the patient face to face. The total time spent in the appointment was 60 minutes.  Disclaimer: This note was dictated with voice recognition software. Similar sounding words can inadvertently be transcribed and may not be corrected upon review.   Bahar Shelden K. November 03, 2015, 2:10 PM

## 2015-11-03 NOTE — Progress Notes (Signed)
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 female who initially presented with flu symptoms. She had a fever, dry cough and shortness of breath progressively worsening over 1 week. She presented to the ED (hospital admission from 10/20/15-10/28/15) where a chest X-ray was performed which suggested the presence of a right upper lung mass. CT scan on 10/22/15 confirmed a 4.4 cm right upper lung mass which abuts the visceral pleural. She underwent lung biopsy showing squamous cell cancer on 10/25/15. She underwent HD in the hospital with renal team. She has PMH of hypertension, DM type II, dyslipidemia, tobacco use disorder, anxiety and depression, ESRD on hemodialysis, PVD with left and right 2nd toe amputations among others. She continues to smoke at present. She has been on dialysis for 3 years.    On today's visit the patient denies hemoptysis, no pain in the chest, no headaches, no vision changes. The dry cough has since resolved. She does have some trouble raising her left arm due to rotator cuff surgery, she states stretching helps this.  Patient lives in [place]. Uses public transportation, for dialysis treatment. Currently living in a rehabilitation facility before returning home  PREVIOUS RADIATION THERAPY: No  PAST MEDICAL HISTORY:  has a past medical history of HYPOTHYROIDISM (02/17/2007); HYPERLIPIDEMIA (02/17/2007); GOUT (05/29/2007); ANEMIA-NOS (05/29/2007); ANXIETY (03/23/2010); CIGARETTE SMOKER (09/17/2007); DEPRESSION (02/17/2007); RESTLESS LEG SYNDROME (05/29/2007); PERIPHERAL NEUROPATHY (05/29/2007); HYPERTENSION (02/17/2007);  PERIPHERAL VASCULAR DISEASE (02/17/2007); PEPTIC ULCER DISEASE (05/29/2007); OSTEOPENIA (09/22/2009); SEIZURE DISORDER (02/17/2007); Memory loss (01/24/2010); Personal history of colonic polyps (11/16/2009); Palpitations (09/08/2010); Vocal cord paralysis (1996); 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); Complication of anesthesia; Heart murmur (10/02/2010); Chronic bronchitis ([place]); Type II diabetes mellitus ([place]) (02/17/2007); COMMON MIGRAINE; Arthritis; Chronic lower back pain; and Renal insufficiency.    PAST SURGICAL HISTORY: Past Surgical History  Procedure Laterality Date  . Toe amputation Left 2006  . Bunionectomy Bilateral 1980  . Thyroid surgery  1997    goiter removal  . Stress cardiolite  06/18/2006  . Tranthoracic echocardiogram  06/18/2006  . Electrocardiogram  05/29/2006  . Shoulder open rotator cuff repair Left     Dr. Sharol Given  . Av fistula placement  03/13/2012    Procedure: ARTERIOVENOUS (AV) FISTULA CREATION;  Surgeon: Conrad Casper Mountain, MD;  Location: [place];  Service: Vascular;  Laterality: Right;  . Cataract extraction, bilateral Bilateral     bilateral cataract removal  . Esophagoscopy w/ botox injection  07/22/2012    Procedure: ESOPHAGOSCOPY WITH BOTOX INJECTION;  Surgeon: Rozetta Nunnery, MD;  Location: [place];  Service: ENT;  Laterality: N/A;  esophageal dilation  . Insertion of dialysis catheter N/A 02/05/2013    Procedure: INSERTION OF DIALYSIS CATHETER;  Surgeon: Angelia Mould, MD;  Location: [place];  Service: Vascular;  Laterality: N/A;  Ultrasound guided  . Toe amputation Right Aug. 2015    2nd   . Laparoscopic cholecystectomy    . Video bronchoscopy Bilateral 10/25/2015    Procedure: VIDEO BRONCHOSCOPY WITH  FLUORO;  Surgeon: Rigoberto Noel,  MD;  Location: [place];  Service: Cardiopulmonary;  Laterality: Bilateral;    FAMILY HISTORY: family history includes Coronary artery disease in her other; Dementia in her mother; Heart attack in her brother; Hyperlipidemia in her other; Hypertension in her brother, mother, other, and sister; Ovarian cancer in her other; Stroke in her brother and other.  SOCIAL HISTORY:  reports that she has been smoking Cigarettes.  She has a 12.75 pack-year smoking history. She has never used smokeless tobacco. She reports that she does not drink alcohol or use illicit drugs.  ALLERGIES: Cephalosporins; Nsaids; Pioglitazone; and Vancomycin  MEDICATIONS:  Current Outpatient Prescriptions  Medication Sig Dispense Refill  . acetaminophen (TYLENOL) 325 MG tablet Take 2 tablets (650 mg total) by mouth every 6 (six) hours as needed for mild pain (or Fever >/= 101).    Marland Kitchen albuterol (PROVENTIL HFA;VENTOLIN HFA) 108 (90 BASE) MCG/ACT inhaler Inhale 2 puffs into the lungs every 6 (six) hours as needed for wheezing or shortness of breath. 1 Inhaler 5  . allopurinol (ZYLOPRIM) 100 MG tablet TAKE ONE TABLET BY MOUTH ONCE DAILY 90 tablet 0  . amLODipine (NORVASC) 5 MG tablet Take 5 mg by mouth daily.    Marland Kitchen atorvastatin (LIPITOR) 40 MG tablet TAKE ONE TABLET BY MOUTH ONCE DAILY 30 tablet 5  . benzonatate (TESSALON) 100 MG capsule Take 100-200 mg by mouth every 6 (six) hours as needed for cough. Reported on 11/03/2015    . cinacalcet (SENSIPAR) 30 MG tablet Take 30 mg by mouth daily.    . clonazePAM (KLONOPIN) 1 MG tablet Take 1 tablet (1 mg total) by mouth daily. 30 tablet 0  . colchicine 0.6 MG tablet Take 0.5 tablets (0.3 mg total) by mouth 2 (two) times a week. 5 tablet 0  . cyclobenzaprine (FLEXERIL) 10 MG tablet TAKE ONE TABLET BY MOUTH THREE TIMES DAILY AS NEEDED FOR MUSCLE SPASM 30 tablet 0  . darbepoetin (ARANESP) 25 MCG/0.42ML SOLN injection Inject 25 mcg into the vein every 7 (seven) days. Every Thursday at  dialysis    . docusate sodium (COLACE) 100 MG capsule Take 100 mg by mouth 2 (two) times daily.     Marland Kitchen doxercalciferol (HECTOROL) 4 MCG/2ML injection Inject 1 mL (2 mcg total) into the vein Every Tuesday,Thursday,and Saturday with dialysis.    . DULoxetine (CYMBALTA) 30 MG capsule Take 2 capsules (60 mg total) by mouth daily. 60 capsule 11  . famotidine (PEPCID) 40 MG tablet Take 1 tablet (40 mg total) by mouth daily. 60 tablet 0  . HYDROcodone-acetaminophen (NORCO/VICODIN) 5-325 MG per tablet Take 1 tablet by mouth every 6 (six) hours as needed for severe pain. 30 tablet 0  . hydrOXYzine (ATARAX/VISTARIL) 25 MG tablet TAKE ONE TABLET BY MOUTH EVERY 6 HOURS AS NEEDED FOR ANXIETY. 30 tablet 0  . lactulose (CHRONULAC) 10 GM/15ML solution Take 30 mLs (20 g total) by mouth every other day. 473 mL 3  . levothyroxine (SYNTHROID, LEVOTHROID) 175 MCG tablet Take 1 tablet (175 mcg total) by mouth daily before breakfast. 90 tablet 3  . mometasone-formoterol (DULERA) 100-5 MCG/ACT AERO INHALE TWO PUFFS INTO LUNGS TWICE DAILY 1 Inhaler 5  . montelukast (SINGULAIR) 10 MG tablet TAKE ONE TABLET BY MOUTH AT BEDTIME 30 tablet 5  . multivitamin (RENA-VIT) TABS tablet Take 1 tablet by mouth daily.    . ondansetron (ZOFRAN) 4 MG tablet Take 4 mg by mouth every 6 (six) hours as needed for nausea or vomiting.    Marland Kitchen  pregabalin (LYRICA) 50 MG capsule Take 50 mg by mouth daily.    . QUEtiapine (SEROQUEL) 100 MG tablet TAKE ONE TABLET BY MOUTH AT BEDTIME 90 tablet 0  . sevelamer carbonate (RENVELA) 800 MG tablet Take 3,200 mg by mouth 3 (three) times daily with meals. 4 tablets with each meal and 3 tablets with each snack. ( pt takes 7 times daily)     No current facility-administered medications for this encounter.    REVIEW OF SYSTEMS:  A 15 point review of systems is documented in the electronic medical record. This was obtained by the nursing staff. However, I reviewed this with the patient to discuss relevant findings  and make appropriate changes.  Pertinent items are noted in HPI.   PHYSICAL EXAM:  Vitals with Age-Percentiles 11/03/2015  Length 211.9 cm  Systolic 417  Diastolic 67  MAP   Pulse 90  Respiration 18  Weight 69.945 kg  BMI 25.7   General: Alert and oriented, in no acute distress, Accompanied by acute on exam today. The patient is a little shaky getting up on the examination table and needs assistance. She did have dialysis earlier today. HEENT: Head is normocephalic. Extraocular movements are intact. Oropharynx is clear. Neck: Neck is supple, no palpable cervical or supraclavicular lymphadenopathy. Heart: Regular in rate and rhythm with no murmurs, rubs, or gallops. Chest: Clear to auscultation bilaterally, with no rhonchi, wheezes, or rales. Abdomen: Soft, nontender, nondistended, with no rigidity or guarding. Extremities: No cyanosis or edema. Lymphatics: see Neck Exam Skin: No concerning lesions. Musculoskeletal: symmetric strength and muscle tone throughout. Neurologic: Cranial nerves II through XII are grossly intact. No obvious focalities. Speech is fluent. Coordination is intact. Psychiatric: Judgment and insight are intact. Affect is appropriate.   ECOG = 2  LABORATORY DATA:  Lab Results  Component Value Date   WBC 7.3 11/03/2015   HGB 12.6 11/03/2015   HCT 40.0 11/03/2015   MCV 94.3 11/03/2015   PLT 261 11/03/2015   NEUTROABS 3.8 11/03/2015   Lab Results  Component Value Date   NA 139 11/03/2015   K 4.3 11/03/2015   CL 97* 10/27/2015   CO2 29 11/03/2015   GLUCOSE 60* 11/03/2015   CREATININE 3.6* 11/03/2015   CALCIUM 9.7 11/03/2015      RADIOGRAPHY: X-ray Chest Pa And Lateral  10/20/2015  CLINICAL DATA:  Cough progressively getting worse. EXAM: CHEST  2 VIEW COMPARISON:  08/03/2014 FINDINGS: Lungs are hypoinflated with a 3.1 cm masslike opacity over the right upper lobe. Mild prominence of the perihilar markings. No evidence of effusion. Cardiomediastinal  silhouette is within normal. There is calcified plaque over the aortic arch. There are degenerative changes of the spine with curvature of the thoracic spine convex right unchanged. Degenerative change of the left shoulder. Surgical clips are present over the neck base. IMPRESSION: No acute cardiopulmonary disease per 3.1 cm masslike opacity over the right upper lobe. Recommend chest CT for further evaluation to exclude malignancy. Electronically Signed   By: Marin Olp M.D.   On: 10/20/2015 17:50   Ct Chest W Contrast  10/22/2015  CLINICAL DATA:  Masslike opacity in the right upper lung on recent chest radiograph. EXAM: CT CHEST WITH CONTRAST TECHNIQUE: Multidetector CT imaging of the chest was performed during intravenous contrast administration. CONTRAST:  58m OMNIPAQUE IOHEXOL 300 MG/ML  SOLN COMPARISON:  10/20/2015 chest radiograph. FINDINGS: Mediastinum/Nodes: Normal heart size. No pericardial fluid/thickening. Left main, left anterior descending, left circumflex and right coronary atherosclerosis. Great vessels are  normal in course and caliber. No central pulmonary emboli. Thyroid appears surgically absent. Normal esophagus. No axillary adenopathy. Mildly enlarged 1.2 cm subcarinal node (series 2/ image 24). Mildly enlarged 1.1 cm AP window node (series 2/ image 19). Mildly enlarged 1.0 cm right hilar node (series 2/ image 24). No left hilar adenopathy. Lungs/Pleura: No pneumothorax. No pleural effusion. There is a 4.4 x 2.8 cm solid lung mass in the peripheral apical right upper lobe (series 3/ image 11), which abuts the visceral pleural along a broad base. There is material occluding the right upper lobe bronchus. There is patchy peribronchovascular ground-glass opacity throughout the central right upper lobe. There are 2 subpleural anterior right middle lobe pulmonary nodules, largest 3 mm (series 3/image 38). There is a 5 mm apical left upper lobe pulmonary nodule (series 3/image 10). Mild  centrilobular emphysema and diffuse bronchial wall thickening. Subsegmental atelectasis in the dependent lower lobes. Upper abdomen: Atrophic appearing kidneys.  Cholecystectomy. Musculoskeletal: No aggressive appearing focal osseous lesions. Mild-to-moderate degenerative changes in the thoracic spine. IMPRESSION: 1. Peripheral apical right upper lobe 4.4 cm lung mass, which abuts the visceral pleural, suspicious for a primary bronchogenic carcinoma. 2. Right hilar and subcarinal and AP window mediastinal lymphadenopathy, suspicious for nodal metastases. 3. Occlusion of right mainstem bronchus by nonspecific material, possibly tumor and/or mucous plugging. Patchy peribronchovascular ground-glass opacity throughout the central right upper lobe, probably postobstructive pneumonitis. 4. Left upper lobe 5 mm pulmonary nodule, indeterminate. Tiny subpleural right middle lobe pulmonary nodules are probably benign. 5. Additional findings include coronary atherosclerosis and mild emphysema. Electronically Signed   By: Ilona Sorrel M.D.   On: 10/22/2015 09:10      IMPRESSION: The patient has been diagnosed with stage IIIa (PET and MRI pending) non-small squamous cell right lung cancer. At this time, a brain MRI and PET scan will be scheduled to determine the full extent of her disease.   PLAN: We discussed the possible side effects and risks of treatment in addition to the possible benefits of treatment. We discussed the protocol for radiation treatment.  All of the patient's questions were answered.   An MRI and PET scan will be scheduled in the near future, I will see patient after staging workup is complete.       ------------------------------------------------  Blair Promise, PhD, MD   This document serves as a record of services personally performed by Gery Pray, MD. It was created on his behalf by Derek Mound, a trained medical scribe. The creation of this record is based on the scribe's  personal observations and the provider's statements to them. This document has been checked and approved by the attending provider.

## 2015-11-03 NOTE — Telephone Encounter (Signed)
per pof to sch pt appt-gave pt copy of avs-adv central sch will call to sch trmt °

## 2015-11-03 NOTE — Telephone Encounter (Signed)
This is Missy calling about our [place] [phone]) who was seen there today.  She apparently did not give you all our paperwork to complete for [place].  Are there any new orders or change in her treatment plan?"    Advised no new orders and treatment plan is being established at this time.

## 2015-11-04 ENCOUNTER — Encounter: Payer: Self-pay | Admitting: *Deleted

## 2015-11-04 NOTE — Progress Notes (Signed)
I received a message from Triage.  [facility] called to get an update on treatment plan.  I called facility.  I left vm message for the nurse and updated her on next steps.  I also left my name and phone number to call if needed.

## 2015-11-04 NOTE — Progress Notes (Signed)
Oncology Nurse Navigator Documentation  Oncology Nurse Navigator Flowsheets 11/04/2015  Navigator Encounter Type Clinic/MDC  Abnormal Finding Date 10/22/2015  Confirmed Diagnosis Date 10/25/2015  Patient Visit Type MedOnc  Treatment Phase Pre-Tx/Tx Discussion  Barriers/Navigation Needs Education  Education Understanding Cancer/ Treatment Options;Newly Diagnosed Cancer Education  Acuity Level 2  Acuity Level 1 Initial guidance, education and coordination as needed  Time Spent with Patient 30   Spoke to patient and nephew yesterday at [facility].  Gave and explained information on next steps, dx, stage, and tx.

## 2015-11-07 ENCOUNTER — Encounter: Payer: Self-pay | Admitting: *Deleted

## 2015-11-07 ENCOUNTER — Telehealth: Payer: Self-pay | Admitting: *Deleted

## 2015-11-07 NOTE — Progress Notes (Signed)
Oncology Nurse Navigator Documentation  Oncology Nurse Navigator Flowsheets 11/07/2015  Navigator Encounter Type Telephone  Telephone Outgoing Call  Treatment Phase Pre-Tx/Tx Discussion  Barriers/Navigation Needs Coordination of Care  Interventions Coordination of Care  Coordination of Care Appts;Radiology  Acuity Level 2  Acuity Level 2 Assistance expediting appointments  Time Spent with Patient 30   I called central scheduling to arrange appt for PET and MRI Brain.  I received an appt.  I called facility and left a vm message regarding appt on 11/18/15 arrive at [facility] at 12:30 and pre-procedure instructions.  I also left phone number to call with any questions or concerns.

## 2015-11-07 NOTE — Progress Notes (Signed)
I updated Dr. Julien Nordmann that patient's PET and MRI Brain was not until after the appt with him on 4/12.  He asked to re-schedule. [facility] place per Dr. Julien Nordmann to see her on 11/21/15 at 11:45.

## 2015-11-07 NOTE — Telephone Encounter (Signed)
Dewitt Rota with [facility] called for navigator.  Request orders be faxed for PET and MRI on 11-18-2015 in order for Transportation to be arranged.

## 2015-11-07 NOTE — Telephone Encounter (Signed)
lvm for pt regarding to 4.12 cx and moved to 4.17 per pof

## 2015-11-09 ENCOUNTER — Non-Acute Institutional Stay (SKILLED_NURSING_FACILITY): Payer: Medicare Other | Admitting: Family

## 2015-11-09 ENCOUNTER — Encounter: Payer: Self-pay | Admitting: Family

## 2015-11-09 ENCOUNTER — Encounter (HOSPITAL_COMMUNITY): Payer: Self-pay

## 2015-11-09 DIAGNOSIS — M25561 Pain in right knee: Secondary | ICD-10-CM | POA: Diagnosis not present

## 2015-11-09 DIAGNOSIS — R269 Unspecified abnormalities of gait and mobility: Secondary | ICD-10-CM | POA: Diagnosis not present

## 2015-11-09 DIAGNOSIS — E039 Hypothyroidism, unspecified: Secondary | ICD-10-CM

## 2015-11-09 DIAGNOSIS — I10 Essential (primary) hypertension: Secondary | ICD-10-CM | POA: Diagnosis not present

## 2015-11-09 DIAGNOSIS — M25562 Pain in left knee: Secondary | ICD-10-CM

## 2015-11-09 DIAGNOSIS — J449 Chronic obstructive pulmonary disease, unspecified: Secondary | ICD-10-CM

## 2015-11-09 DIAGNOSIS — Z992 Dependence on renal dialysis: Secondary | ICD-10-CM

## 2015-11-09 DIAGNOSIS — N186 End stage renal disease: Secondary | ICD-10-CM

## 2015-11-09 NOTE — Progress Notes (Signed)
Patient ID: [name] [name], female   DOB: 1940-12-27, 75 y.o.   MRN: [id number]  Location:  [place] Room Number: 101 Place of Service:  SNF [phone])  Provider: Marlowe Sax, FNP-C  PCP: Cathlean Cower, MD Patient Care Team: Biagio Borg, MD as PCP - General Donato Heinz, MD as Consulting Physician (Nephrology) Dixie Dials, MD as Consulting Physician (Cardiology) Trula Slade, DPM as Consulting Physician (Podiatry) [facility]  Extended Emergency Contact Information Primary Emergency Contact: [place] Mobile Phone: [phone] Relation: Niece Secondary Emergency Contact: [place] Address: [address]          [place], [place] [address] [place] Phone: [phone] Mobile Phone: [phone] Relation: Friend  Code Status: Full Code  Goals of care:  Advanced Directive information Advanced Directives 11/03/2015  Does patient have an advance directive? Yes  Type of Advance Directive Living will;Healthcare Power of Attorney  Does patient want to make changes to advanced directive? -  Copy of advanced directive(s) in chart? Yes     Allergies  Allergen Reactions  . Cephalosporins Itching and Rash    Vanc and fortaz given at the same time in June for several doses at dialysis with systemic rash and itching; received zinacef 7/5 and had worseningsystemicrash/ itching and swelling of eyes - so unclear if allergic to either or both-  Pt denies this  . Nsaids     REACTION: renal dysfunction  . Pioglitazone Swelling    Actos REACTION: EDEMA  . Vancomycin Rash    See comment under cephalosporin    Chief Complaint  Patient presents with  . Discharge Note    Discharge from SNF    HPI:  75 y.o. female seen today at [facility] for discharge home. She was here for short term rehabilitation post hospital admission from 10/20/15-10/28/15 with  worsening dyspnea. She was treated with antibiotics for possible bronchitis with bronchodilator treatment. CXR revealed a right upper lung mass. 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 She underwent HD in the hospital with renal team. She has a medical history of Type 2 DM, HTN, ESRD on hemodialysis, Depression, Anxiety among others. She is seen in her room today. She denies any acute issues this visit. She is schedule for PET scan and MRI of the brain therefore NPO after mid night 11/18/15  But may have water. She has worked well with PT/OT and now stable for discharge home. She will be discharged with PT/OT to continue ROM, Exercise, Gait stability and Muscle strengthening. She does not require any DME. Facility social worker will arrange Home health services prior to discharge home.     Past Medical History  Diagnosis Date  . HYPOTHYROIDISM 02/17/2007    s/p surgical removal of goiter in 1997  . HYPERLIPIDEMIA 02/17/2007  . GOUT 05/29/2007  . ANEMIA-NOS 05/29/2007  . ANXIETY 03/23/2010  . 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 DEPRESSION 02/17/2007  . RESTLESS LEG SYNDROME 05/29/2007  . PERIPHERAL NEUROPATHY 05/29/2007  . HYPERTENSION 02/17/2007  . PERIPHERAL VASCULAR DISEASE 02/17/2007  . PEPTIC ULCER DISEASE 05/29/2007    "when I was in college"  . OSTEOPENIA 09/22/2009  . SEIZURE DISORDER 02/17/2007  . Memory loss 01/24/2010  . Personal history of colonic polyps 11/16/2009  . Palpitations 09/08/2010  . Vocal cord paralysis 1996  . 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 Chronic neck pain 12/13/2010  . GERD (gastroesophageal reflux disease)   .  Critical lower limb ischemia   . ESRD on hemodialysis ([place]) 02/17/2007    Started dialysis April 2014.  Gets HD at [facility] on TTS schedule.  Cause of ESRD was HTN.     . Cancer of kidney ([place]) 10/07/2012    Followed per Dr Despina Pole, MD, urology, [facility]   . ESRD (end stage renal disease) on dialysis ([place])     "TTS. [address]"  (10/20/2015)  . 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 Heart murmur 10/02/2010    hx  . Chronic bronchitis ([place])   . Type II diabetes mellitus ([place]) 02/17/2007    "haven't had it since ~ 2010" (10/20/2015)  . COMMON MIGRAINE     "stress common migraines"  . Arthritis     "all over my body"  . Chronic lower back pain   . Renal insufficiency     Past Surgical History  Procedure Laterality Date  . Toe amputation Left 2006  . Bunionectomy Bilateral 1980  . Thyroid surgery  1997    goiter removal  . Stress cardiolite  06/18/2006  . Tranthoracic echocardiogram  06/18/2006  . Electrocardiogram  05/29/2006  . Shoulder open rotator cuff repair Left     Dr. Sharol Given  . Av fistula placement  03/13/2012    Procedure: ARTERIOVENOUS (AV) FISTULA CREATION;  Surgeon: Conrad Cedarhurst, MD;  Location: [place];  Service: Vascular;  Laterality: Right;  . Cataract extraction, bilateral Bilateral     bilateral cataract removal  . Esophagoscopy w/ botox injection  07/22/2012    Procedure: ESOPHAGOSCOPY WITH BOTOX INJECTION;  Surgeon: Rozetta Nunnery, MD;  Location: [place];  Service: ENT;  Laterality: N/A;  esophageal dilation  . Insertion of dialysis catheter N/A 02/05/2013    Procedure: INSERTION OF DIALYSIS CATHETER;  Surgeon: Angelia Mould, MD;  Location: [facility];  Service: Vascular;  Laterality: N/A;  Ultrasound guided  . Toe amputation Right Aug. 2015    2nd   . Laparoscopic cholecystectomy    . Video bronchoscopy Bilateral 10/25/2015    Procedure: VIDEO BRONCHOSCOPY WITH FLUORO;  Surgeon: Rigoberto Noel, MD;  Location: [place];  Service: Cardiopulmonary;  Laterality: Bilateral;      reports that she has been smoking Cigarettes.  She has a 12.75 pack-year smoking history. She has never used smokeless tobacco. She reports that she does not drink alcohol or use illicit drugs. Social History   Social History  . Marital Status: Single     Spouse Name: N/A  . Number of Children: N/A  . Years of Education: N/A   Occupational History  . disabled     c-spine and back pain   Social History Main Topics  . Smoking status: Current Some Day Smoker -- 0.25 packs/day for 51 years    Types: Cigarettes  . Smokeless tobacco: Never Used     Comment: USING E CIG  . Alcohol Use: No  . Drug Use: No  . Sexual Activity: Not Currently   Other Topics Concern  . Not on file   Social History Narrative   Currently on disability for her back pain.  Lives alone, though has been thinking about moving to ALF/SNF recently.   Functional Status Survey:    Allergies  Allergen Reactions  . Cephalosporins Itching and Rash    Vanc and fortaz given at the same time in June for several doses at dialysis with systemic rash  and itching; received zinacef 7/5 and had worseningsystemicrash/ itching and swelling of eyes - so unclear if allergic to either or both-  Pt denies this  . Nsaids     REACTION: renal dysfunction  . Pioglitazone Swelling    Actos REACTION: EDEMA  . Vancomycin Rash    See comment under cephalosporin    Pertinent  Health Maintenance Due  Topic Date Due  . URINE MICROALBUMIN  12/12/1949  . DEXA SCAN  12/12/2004  . INFLUENZA VACCINE  03/06/2016  . COLONOSCOPY  01/09/2018  . PNA vac Low Risk Adult  Completed    Medications:   Medication List       This list is accurate as of: 11/09/15 11:50 AM.  Always use your most recent med list.               acetaminophen 325 MG tablet  Commonly known as:  TYLENOL  Take 2 tablets (650 mg total) by mouth every 6 (six) hours as needed for mild pain (or Fever >/= 101).     albuterol 108 (90 Base) MCG/ACT inhaler  Commonly known as:  PROVENTIL HFA;VENTOLIN HFA  Inhale 2 puffs into the lungs every 6 (six) hours as needed for wheezing or shortness of breath.     allopurinol 100 MG tablet  Commonly known as:  ZYLOPRIM  TAKE ONE TABLET BY MOUTH ONCE DAILY     amLODipine 5 MG  tablet  Commonly known as:  NORVASC  Take 5 mg by mouth daily.     atorvastatin 40 MG tablet  Commonly known as:  LIPITOR  TAKE ONE TABLET BY MOUTH ONCE DAILY     benzonatate 100 MG capsule  Commonly known as:  TESSALON  Take 100-200 mg by mouth every 6 (six) hours as needed for cough. Reported on 11/03/2015     budesonide-formoterol 160-4.5 MCG/ACT inhaler  Commonly known as:  SYMBICORT  Inhale 2 puffs into the lungs 2 (two) times daily. Wait 1 min in between puffs, rinse mouth after use     clonazePAM 1 MG tablet  Commonly known as:  KLONOPIN  Take 1 tablet (1 mg total) by mouth daily.     colchicine 0.6 MG tablet  Take 0.5 tablets (0.3 mg total) by mouth 2 (two) times a week.     cyclobenzaprine 10 MG tablet  Commonly known as:  FLEXERIL  TAKE ONE TABLET BY MOUTH THREE TIMES DAILY AS NEEDED FOR MUSCLE SPASM     darbepoetin 25 MCG/0.42ML Soln injection  Commonly known as:  ARANESP  Inject 25 mcg into the vein every 7 (seven) days. Every Thursday at dialysis     docusate sodium 100 MG capsule  Commonly known as:  COLACE  Take 100 mg by mouth 2 (two) times daily.     doxercalciferol 4 MCG/2ML injection  Commonly known as:  HECTOROL  Inject 1 mL (2 mcg total) into the vein Every Tuesday,Thursday,and Saturday with dialysis.     DULoxetine 30 MG capsule  Commonly known as:  CYMBALTA  Take 2 capsules (60 mg total) by mouth daily.     famotidine 40 MG tablet  Commonly known as:  PEPCID  Take 1 tablet (40 mg total) by mouth daily.     HYDROcodone-acetaminophen 5-325 MG tablet  Commonly known as:  NORCO/VICODIN  Take 1 tablet by mouth every 6 (six) hours as needed for severe pain.     hydrOXYzine 25 MG tablet  Commonly known as:  ATARAX/VISTARIL  TAKE ONE TABLET  BY MOUTH EVERY 6 HOURS AS NEEDED FOR ANXIETY.     Lactulose 20 GM/30ML Soln  Take by mouth daily.     levothyroxine 175 MCG tablet  Commonly known as:  SYNTHROID, LEVOTHROID  Take 1 tablet (175 mcg total)  by mouth daily before breakfast.     montelukast 10 MG tablet  Commonly known as:  SINGULAIR  TAKE ONE TABLET BY MOUTH AT BEDTIME     multivitamin Tabs tablet  Take 1 tablet by mouth daily.     ondansetron 8 MG tablet  Commonly known as:  ZOFRAN  Take 8 mg by mouth every 6 (six) hours as needed for nausea or vomiting.     pregabalin 50 MG capsule  Commonly known as:  LYRICA  Take 50 mg by mouth daily.     QUEtiapine 100 MG tablet  Commonly known as:  SEROQUEL  TAKE ONE TABLET BY MOUTH AT BEDTIME     sevelamer carbonate 800 MG tablet  Commonly known as:  RENVELA  1 by mouth 2 times daily with snacks, 3 times daily with meals, and at beside for self administration per patient's request        Review of Systems  Constitutional: Negative for fever, chills, activity change, appetite change and fatigue.  HENT: Negative for congestion, rhinorrhea, sinus pressure, sneezing and sore throat.   Eyes: Negative.   Respiratory: Negative for cough, chest tightness, shortness of breath and wheezing.   Cardiovascular: Negative for chest pain, palpitations and leg swelling.  Gastrointestinal: Negative for nausea, vomiting, abdominal pain, diarrhea and abdominal distention.  Endocrine: Negative.   Genitourinary:       Hemodialysis Tue, Thur, Sat   Musculoskeletal: Positive for gait problem.  Skin: Negative.   Neurological: Negative.   Psychiatric/Behavioral: Negative.     Filed Vitals:   11/09/15 1054  BP: 124/66  Pulse: 89  Temp: 98.3 F (36.8 C)  TempSrc: Oral  Resp: 22  Height: '5\' 5"'$  (1.651 m)  Weight: 149 lb (67.586 kg)  SpO2: 97%   Body mass index is 24.79 kg/(m^2). Physical Exam  Constitutional: She is oriented to person, place, and time. No distress.  Thin Frail Elderly   HENT:  Head: Normocephalic.  Mouth/Throat: Oropharynx is clear and moist.  Eyes: Conjunctivae and EOM are normal. Pupils are equal, round, and reactive to light. Right eye exhibits no discharge.  Left eye exhibits no discharge. No scleral icterus.  Neck: Normal range of motion. No JVD present.  Cardiovascular: Normal rate, regular rhythm, normal heart sounds and intact distal pulses.  Exam reveals no gallop and no friction rub.   No murmur heard. Pulmonary/Chest: Effort normal and breath sounds normal. No respiratory distress. She has no wheezes. She has no rales.  Abdominal: Soft. Bowel sounds are normal. She exhibits no distension and no mass. There is no tenderness. There is no rebound and no guarding.  Musculoskeletal: Normal range of motion. She exhibits no edema or tenderness.  Abnormal gait   Lymphadenopathy:    She has no cervical adenopathy.  Neurological: She is oriented to person, place, and time.  Skin: Skin is warm and dry. No rash noted. No erythema. No pallor.  Psychiatric: She has a normal mood and affect.    Labs reviewed: Basic Metabolic Panel:  Recent Labs  10/22/15 1107 10/23/15 0523 10/25/15 1235 10/27/15 10/27/15 0825 11/03/15 1215  NA 133* 136 135 139 139 139  K 5.1 4.3 4.6  --  4.6 4.3  CL 92* 95* 96*  --  97*  --   CO2 '23 27 22  '$ --  27 29  GLUCOSE 130* 104* 172*  --  76 60*  BUN 38* 16 45* 28* 28* 14.3  CREATININE 7.26* 4.59* 8.44* 7.4* 7.40* 3.6*  CALCIUM 8.5* 8.7* 8.5*  --  8.7* 9.7  PHOS 5.2*  --  5.0*  --  5.2*  --    Liver Function Tests:  Recent Labs  05/25/15 1515 10/20/15 1638  10/25/15 1235 10/27/15 0825 11/03/15 1215  AST 31 35  --   --   --  24  ALT 29 27  --   --   --  33  ALKPHOS 85 97  --   --   --  100  BILITOT 1.6* 1.8*  --   --   --  0.32  PROT 7.9 8.2*  --   --   --  8.9*  ALBUMIN 3.7 3.9  < > 2.7* 2.7* 4.0  < > = values in this interval not displayed. No results for input(s): LIPASE, AMYLASE in the last 8760 hours. No results for input(s): AMMONIA in the last 8760 hours. CBC:  Recent Labs  05/25/15 1515  10/20/15 1638  10/25/15 1236 10/27/15 10/27/15 0824 11/03/15 1215  WBC 9.8  < > 9.7  < > 7.1 5.2 5.2  7.3  NEUTROABS 5.7  --  7.9*  --   --   --   --  3.8  HGB 14.4  < > 13.5  < > 11.5*  --  11.1* 12.6  HCT 42.4  < > 43.2  < > 34.1*  --  33.6* 40.0  MCV 89.3  < > 95.4  < > 90.5  --  91.6 94.3  PLT 160  < > 220  < > 198  --  220 261  < > = values in this interval not displayed. Cardiac Enzymes: No results for input(s): CKTOTAL, CKMB, CKMBINDEX, TROPONINI in the last 8760 hours. BNP: Invalid input(s): POCBNP CBG:  Recent Labs  05/25/15 2112 05/27/15 0825  GLUCAP 124* 82    Procedures and Imaging Studies During Stay: X-ray Chest Pa And Lateral  10/20/2015  CLINICAL DATA:  Cough progressively getting worse. EXAM: CHEST  2 VIEW COMPARISON:  08/03/2014 FINDINGS: Lungs are hypoinflated with a 3.1 cm masslike opacity over the right upper lobe. Mild prominence of the perihilar markings. No evidence of effusion. Cardiomediastinal silhouette is within normal. There is calcified plaque over the aortic arch. There are degenerative changes of the spine with curvature of the thoracic spine convex right unchanged. Degenerative change of the left shoulder. Surgical clips are present over the neck base. IMPRESSION: No acute cardiopulmonary disease per 3.1 cm masslike opacity over the right upper lobe. Recommend chest CT for further evaluation to exclude malignancy. Electronically Signed   By: Marin Olp M.D.   On: 10/20/2015 17:50   Ct Chest W Contrast  10/22/2015  CLINICAL DATA:  Masslike opacity in the right upper lung on recent chest radiograph. EXAM: CT CHEST WITH CONTRAST TECHNIQUE: Multidetector CT imaging of the chest was performed during intravenous contrast administration. CONTRAST:  47m OMNIPAQUE IOHEXOL 300 MG/ML  SOLN COMPARISON:  10/20/2015 chest radiograph. FINDINGS: Mediastinum/Nodes: Normal heart size. No pericardial fluid/thickening. Left main, left anterior descending, left circumflex and right coronary atherosclerosis. Great vessels are normal in course and caliber. No central pulmonary  emboli. Thyroid appears surgically absent. Normal esophagus. No axillary adenopathy. Mildly enlarged 1.2 cm subcarinal node (series 2/ image 24). Mildly enlarged  1.1 cm AP window node (series 2/ image 19). Mildly enlarged 1.0 cm right hilar node (series 2/ image 24). No left hilar adenopathy. Lungs/Pleura: No pneumothorax. No pleural effusion. There is a 4.4 x 2.8 cm solid lung mass in the peripheral apical right upper lobe (series 3/ image 11), which abuts the visceral pleural along a broad base. There is material occluding the right upper lobe bronchus. There is patchy peribronchovascular ground-glass opacity throughout the central right upper lobe. There are 2 subpleural anterior right middle lobe pulmonary nodules, largest 3 mm (series 3/image 38). There is a 5 mm apical left upper lobe pulmonary nodule (series 3/image 10). Mild centrilobular emphysema and diffuse bronchial wall thickening. Subsegmental atelectasis in the dependent lower lobes. Upper abdomen: Atrophic appearing kidneys.  Cholecystectomy. Musculoskeletal: No aggressive appearing focal osseous lesions. Mild-to-moderate degenerative changes in the thoracic spine. IMPRESSION: 1. Peripheral apical right upper lobe 4.4 cm lung mass, which abuts the visceral pleural, suspicious for a primary bronchogenic carcinoma. 2. Right hilar and subcarinal and AP window mediastinal lymphadenopathy, suspicious for nodal metastases. 3. Occlusion of right mainstem bronchus by nonspecific material, possibly tumor and/or mucous plugging. Patchy peribronchovascular ground-glass opacity throughout the central right upper lobe, probably postobstructive pneumonitis. 4. Left upper lobe 5 mm pulmonary nodule, indeterminate. Tiny subpleural right middle lobe pulmonary nodules are probably benign. 5. Additional findings include coronary atherosclerosis and mild emphysema. Electronically Signed   By: Ilona Sorrel M.D.   On: 10/22/2015 09:10    Assessment/Plan:    COPD Status post hospital admission from 10/20/15-10/28/15 with worsening dyspnea. She was treated with antibiotics for possible bronchitis with bronchodilator treatment. CXR revealed a right upper lung mass. 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 Continue Symbicort 160-4.5 mcg inhaler and Albuterol 108 mcg inhaler   Abnormal gait S/p short term rehabilitation. She has worked well with PT/OT and now stable for discharge home. She will be discharged with PT/OT to continue ROM, Exercise, Gait stability and Muscle strengthening. She does not require any DME.  Knee pain Current regimen effective. Hard script for Hydrocodone-APAP 5-325 mg Tablet written X 1 month supply.    HTN B/p stable. Continue on Amlodipine 5 mg Tablet. Monitor BMP  Hypothyroidism Continue on Levothyroxine 175 mcg Tablet. Monitor TSH level.   ESRD Continue on Hemodialysis Tue,Thur and Sat. Continue to monitor.Follow up with Nephrologist.     Patient is being discharged with the following home health services:    PT/OT to continue ROM, Exercise, Gait stability and Muscle strengthening.   Patient is being discharged with the following durable medical equipment:   She does not require any DME.   Patient has been advised to f/u with their PCP in 1-2 weeks to bring them up to date on their rehab stay.  Social services at facility was responsible for arranging this appointment.  Pt was provided with a 30 day supply of prescriptions for medications and refills must be obtained from their PCP.  For controlled substances, a more limited supply may be provided adequate until PCP appointment only.  Future labs/tests needed: CBC, BMP in 1-2 weeks with PCP

## 2015-11-14 ENCOUNTER — Ambulatory Visit: Payer: Medicare Other | Admitting: Podiatry

## 2015-11-16 ENCOUNTER — Ambulatory Visit: Payer: Medicare Other | Admitting: Internal Medicine

## 2015-11-17 ENCOUNTER — Telehealth: Payer: Self-pay | Admitting: Internal Medicine

## 2015-11-18 ENCOUNTER — Ambulatory Visit (HOSPITAL_COMMUNITY)
Admission: RE | Admit: 2015-11-18 | Discharge: 2015-11-18 | Disposition: A | Discharge status: Home / Self Care | Payer: Medicare Other | Source: Ambulatory Visit | Attending: Internal Medicine | Admitting: Internal Medicine

## 2015-11-18 ENCOUNTER — Other Ambulatory Visit: Payer: Self-pay | Admitting: Medical Oncology

## 2015-11-18 DIAGNOSIS — R918 Other nonspecific abnormal finding of lung field: Secondary | ICD-10-CM | POA: Diagnosis not present

## 2015-11-18 DIAGNOSIS — C3491 Malignant neoplasm of unspecified part of right bronchus or lung: Secondary | ICD-10-CM

## 2015-11-18 DIAGNOSIS — R933 Abnormal findings on diagnostic imaging of other parts of digestive tract: Secondary | ICD-10-CM | POA: Insufficient documentation

## 2015-11-18 LAB — GLUCOSE, CAPILLARY: Glucose-Capillary: 92 mg/dL (ref 65–99)

## 2015-11-18 MED ORDER — FLUDEOXYGLUCOSE F - 18 (FDG) INJECTION: 7.3000 | Freq: Once | INTRAVENOUS | Status: DC | PRN

## 2015-11-21 ENCOUNTER — Ambulatory Visit (HOSPITAL_BASED_OUTPATIENT_CLINIC_OR_DEPARTMENT_OTHER): Payer: Medicare Other | Admitting: Internal Medicine

## 2015-11-21 ENCOUNTER — Telehealth: Payer: Self-pay | Admitting: *Deleted

## 2015-11-21 ENCOUNTER — Encounter: Payer: Self-pay | Admitting: *Deleted

## 2015-11-21 ENCOUNTER — Telehealth: Payer: Self-pay | Admitting: Internal Medicine

## 2015-11-21 ENCOUNTER — Other Ambulatory Visit (HOSPITAL_BASED_OUTPATIENT_CLINIC_OR_DEPARTMENT_OTHER): Payer: Medicare Other

## 2015-11-21 ENCOUNTER — Encounter: Payer: Self-pay | Admitting: Internal Medicine

## 2015-11-21 VITALS — BP 107/61 | HR 94 | Temp 98.0°F | Resp 18 | Ht 65.0 in | Wt 157.1 lb

## 2015-11-21 DIAGNOSIS — C3411 Malignant neoplasm of upper lobe, right bronchus or lung: Secondary | ICD-10-CM | POA: Diagnosis present

## 2015-11-21 DIAGNOSIS — C3491 Malignant neoplasm of unspecified part of right bronchus or lung: Secondary | ICD-10-CM

## 2015-11-21 LAB — CBC WITH DIFFERENTIAL/PLATELET
BASO%: 0.1 % (ref 0.0–2.0)
Basophils Absolute: 0 10*3/uL (ref 0.0–0.1)
EOS ABS: 0.8 10*3/uL — AB (ref 0.0–0.5)
EOS%: 11.4 % — ABNORMAL HIGH (ref 0.0–7.0)
HEMATOCRIT: 34 % — AB (ref 34.8–46.6)
HGB: 11.1 g/dL — ABNORMAL LOW (ref 11.6–15.9)
LYMPH#: 1.4 10*3/uL (ref 0.9–3.3)
LYMPH%: 18.8 % (ref 14.0–49.7)
MCH: 29.8 pg (ref 25.1–34.0)
MCHC: 32.6 g/dL (ref 31.5–36.0)
MCV: 91.4 fL (ref 79.5–101.0)
MONO#: 0.6 10*3/uL (ref 0.1–0.9)
MONO%: 8.3 % (ref 0.0–14.0)
NEUT#: 4.5 10*3/uL (ref 1.5–6.5)
NEUT%: 61.4 % (ref 38.4–76.8)
PLATELETS: 194 10*3/uL (ref 145–400)
RBC: 3.72 10*6/uL (ref 3.70–5.45)
RDW: 17.3 % — ABNORMAL HIGH (ref 11.2–14.5)
WBC: 7.3 10*3/uL (ref 3.9–10.3)

## 2015-11-21 LAB — COMPREHENSIVE METABOLIC PANEL
ALT: 21 U/L (ref 0–55)
AST: 19 U/L (ref 5–34)
Albumin: 3.5 g/dL (ref 3.5–5.0)
Alkaline Phosphatase: 103 U/L (ref 40–150)
Anion Gap: 15 mEq/L — ABNORMAL HIGH (ref 3–11)
BILIRUBIN TOTAL: 0.35 mg/dL (ref 0.20–1.20)
BUN: 48.4 mg/dL — AB (ref 7.0–26.0)
CALCIUM: 9.2 mg/dL (ref 8.4–10.4)
CHLORIDE: 101 meq/L (ref 98–109)
CO2: 24 mEq/L (ref 22–29)
CREATININE: 8.2 mg/dL — AB (ref 0.6–1.1)
EGFR: 5 mL/min/{1.73_m2} — ABNORMAL LOW (ref >90)
Glucose: 111 mg/dl (ref 70–140)
Potassium: 4.8 mEq/L (ref 3.5–5.1)
Sodium: 140 mEq/L (ref 136–145)
TOTAL PROTEIN: 7.7 g/dL (ref 6.4–8.3)

## 2015-11-21 LAB — FUNGUS CULTURE WITH STAIN

## 2015-11-21 LAB — FUNGAL ORGANISM REFLEX

## 2015-11-21 LAB — FUNGUS CULTURE RESULT

## 2015-11-21 NOTE — Telephone Encounter (Signed)
Spoke with Joseph Art at [facility] for second opinion. Renee registered the pt. Joseph Art will give the information to the Navigator. The navigator will call pt with appt.

## 2015-11-21 NOTE — Progress Notes (Signed)
[place] [phone]   [phone]  OFFICE PROGRESS NOTE  Cathlean Cower, MD [place] [address]  DIAGNOSIS: Stage IIIA (T2a, N2, M0) non-small cell lung cancer, squamous cell carcinoma presented with apical right upper lobe lung mass in addition to right hilar, subcarinal lymphadenopathy diagnosed in March 2017. PDL-1 expression is 1%.  PRIOR THERAPY: None  CURRENT THERAPY: None  INTERVAL HISTORY: [name] [name] 75 y.o. female returns to the clinic today for follow-up visit accompanied by several family members. The patient is feeling fine today with no specific complaints except for cough productive of whitish sputum and shortness breath with exertion. She is currently on dialysis Tuesday, Thursday and Saturday. She had several studies performed recently including a PET scan as well as MRI of the brain and she is here for evaluation and discussion of her treatment options. She denied having any significant fever or chills. She has no nausea or vomiting. She denied having any significant weight loss or night sweats.  MEDICAL HISTORY: Past Medical History  Diagnosis Date  . HYPOTHYROIDISM 02/17/2007    s/p surgical removal of goiter in 1997  . HYPERLIPIDEMIA 02/17/2007  . GOUT 05/29/2007  . ANEMIA-NOS 05/29/2007  . ANXIETY 03/23/2010  . 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 DEPRESSION 02/17/2007  . RESTLESS LEG SYNDROME 05/29/2007  . PERIPHERAL NEUROPATHY 05/29/2007  . HYPERTENSION 02/17/2007  . PERIPHERAL VASCULAR DISEASE 02/17/2007  . PEPTIC ULCER DISEASE 05/29/2007    "when I was in college"  . OSTEOPENIA 09/22/2009  . SEIZURE DISORDER 02/17/2007  . Memory loss 01/24/2010  . Personal history of colonic polyps 11/16/2009  . Palpitations 09/08/2010  . Vocal cord paralysis 1996  . 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 Chronic neck pain 12/13/2010  . GERD (gastroesophageal reflux disease)   . Critical lower limb ischemia   . ESRD on  hemodialysis ([place]) 02/17/2007    Started dialysis April 2014.  Gets HD at [facility] on TTS schedule.  Cause of ESRD was HTN.     . Cancer of kidney ([place]) 10/07/2012    Followed per Dr Despina Pole, MD, urology, [place]   . ESRD (end stage renal disease) on dialysis ([place])     "TTS. [address]" (10/20/2015)  . 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 Heart murmur 10/02/2010    hx  . Chronic bronchitis ([place])   . Type II diabetes mellitus ([place]) 02/17/2007    "haven't had it since ~ 2010" (10/20/2015)  . COMMON MIGRAINE     "stress common migraines"  . Arthritis     "all over my body"  . Chronic lower back pain   . Renal insufficiency     ALLERGIES:  is allergic to cephalosporins; nsaids; pioglitazone; and vancomycin.  MEDICATIONS:  Current Outpatient Prescriptions  Medication Sig Dispense Refill  . acetaminophen (TYLENOL) 325 MG tablet Take 2 tablets (650 mg total) by mouth every 6 (six) hours as needed for mild pain (or Fever >/= 101).    Marland Kitchen albuterol (PROVENTIL HFA;VENTOLIN HFA) 108 (90 BASE) MCG/ACT inhaler Inhale 2 puffs into the lungs every 6 (six) hours as needed for wheezing or shortness of breath. 1 Inhaler 5  . allopurinol (ZYLOPRIM) 100 MG tablet TAKE ONE TABLET BY MOUTH ONCE DAILY 90 tablet 0  . amLODipine (NORVASC) 5 MG tablet Take 5 mg by mouth daily.    Marland Kitchen atorvastatin (LIPITOR) 40  MG tablet TAKE ONE TABLET BY MOUTH ONCE DAILY 30 tablet 5  . benzonatate (TESSALON) 100 MG capsule Take 100-200 mg by mouth every 6 (six) hours as needed for cough. Reported on 11/03/2015    . budesonide-formoterol (SYMBICORT) 160-4.5 MCG/ACT inhaler Inhale 2 puffs into the lungs 2 (two) times daily. Wait 1 min in between puffs, rinse mouth after use    . clonazePAM (KLONOPIN) 1 MG tablet Take 1 tablet (1 mg total) by mouth daily. 30 tablet 0  . colchicine 0.6 MG tablet Take 0.5 tablets (0.3 mg total) by mouth 2 (two) times a week. 5 tablet 0  .  cyclobenzaprine (FLEXERIL) 10 MG tablet TAKE ONE TABLET BY MOUTH THREE TIMES DAILY AS NEEDED FOR MUSCLE SPASM 30 tablet 0  . darbepoetin (ARANESP) 25 MCG/0.42ML SOLN injection Inject 25 mcg into the vein every 7 (seven) days. Every Thursday at dialysis    . docusate sodium (COLACE) 100 MG capsule Take 100 mg by mouth 2 (two) times daily.     Marland Kitchen doxercalciferol (HECTOROL) 4 MCG/2ML injection Inject 1 mL (2 mcg total) into the vein Every Tuesday,Thursday,and Saturday with dialysis.    . DULoxetine (CYMBALTA) 30 MG capsule Take 2 capsules (60 mg total) by mouth daily. 60 capsule 11  . famotidine (PEPCID) 40 MG tablet Take 1 tablet (40 mg total) by mouth daily. 60 tablet 0  . HYDROcodone-acetaminophen (NORCO/VICODIN) 5-325 MG per tablet Take 1 tablet by mouth every 6 (six) hours as needed for severe pain. 30 tablet 0  . hydrOXYzine (ATARAX/VISTARIL) 25 MG tablet TAKE ONE TABLET BY MOUTH EVERY 6 HOURS AS NEEDED FOR ANXIETY. 30 tablet 0  . Lactulose 20 GM/30ML SOLN Take by mouth daily.    Marland Kitchen levothyroxine (SYNTHROID, LEVOTHROID) 175 MCG tablet Take 1 tablet (175 mcg total) by mouth daily before breakfast. 90 tablet 3  . montelukast (SINGULAIR) 10 MG tablet TAKE ONE TABLET BY MOUTH AT BEDTIME 30 tablet 5  . multivitamin (RENA-VIT) TABS tablet Take 1 tablet by mouth daily.    . ondansetron (ZOFRAN) 8 MG tablet Take 8 mg by mouth every 6 (six) hours as needed for nausea or vomiting.    . pregabalin (LYRICA) 50 MG capsule Take 50 mg by mouth daily.    . QUEtiapine (SEROQUEL) 100 MG tablet TAKE ONE TABLET BY MOUTH AT BEDTIME 90 tablet 0  . sevelamer carbonate (RENVELA) 800 MG tablet 1 by mouth 2 times daily with snacks, 3 times daily with meals, and at beside for self administration per patient's request     No current facility-administered medications for this visit.   Facility-Administered Medications Ordered in Other Visits  Medication Dose Route Frequency Provider Last Rate Last Dose  . fludeoxyglucose F  - 18 (FDG) injection 7.3 milli Curie  7.3 milli Curie Intravenous Once PRN Curt Bears, MD        SURGICAL HISTORY:  Past Surgical History  Procedure Laterality Date  . Toe amputation Left 2006  . Bunionectomy Bilateral 1980  . Thyroid surgery  1997    goiter removal  . Stress cardiolite  06/18/2006  . Tranthoracic echocardiogram  06/18/2006  . Electrocardiogram  05/29/2006  . Shoulder open rotator cuff repair Left     Dr. Sharol Given  . Av fistula placement  03/13/2012    Procedure: ARTERIOVENOUS (AV) FISTULA CREATION;  Surgeon: Conrad Owasso, MD;  Location: [facility] OR;  Service: Vascular;  Laterality: Right;  . Cataract extraction, bilateral Bilateral     bilateral cataract removal  .  Esophagoscopy w/ botox injection  07/22/2012    Procedure: ESOPHAGOSCOPY WITH BOTOX INJECTION;  Surgeon: Rozetta Nunnery, MD;  Location: [facility];  Service: ENT;  Laterality: N/A;  esophageal dilation  . Insertion of dialysis catheter N/A 02/05/2013    Procedure: INSERTION OF DIALYSIS CATHETER;  Surgeon: Angelia Mould, MD;  Location: [place];  Service: Vascular;  Laterality: N/A;  Ultrasound guided  . Toe amputation Right Aug. 2015    2nd   . Laparoscopic cholecystectomy    . Video bronchoscopy Bilateral 10/25/2015    Procedure: VIDEO BRONCHOSCOPY WITH FLUORO;  Surgeon: Rigoberto Noel, MD;  Location: [place];  Service: Cardiopulmonary;  Laterality: Bilateral;    REVIEW OF SYSTEMS:  Constitutional: positive for fatigue Eyes: negative Ears, nose, mouth, throat, and face: negative Respiratory: positive for cough, dyspnea on exertion and sputum Cardiovascular: negative Gastrointestinal: negative Genitourinary:negative Integument/breast: negative Hematologic/lymphatic: negative Musculoskeletal:negative Neurological: negative Behavioral/Psych: negative Endocrine: negative Allergic/Immunologic: negative   PHYSICAL EXAMINATION: General appearance: alert, cooperative, fatigued and  no distress Head: Normocephalic, without obvious abnormality, atraumatic Neck: no adenopathy, no JVD, supple, symmetrical, trachea midline and thyroid not enlarged, symmetric, no tenderness/mass/nodules Lymph nodes: Cervical, supraclavicular, and axillary nodes normal. Resp: clear to auscultation bilaterally Back: symmetric, no curvature. ROM normal. No CVA tenderness. Cardio: regular rate and rhythm, S1, S2 normal, no murmur, click, rub or gallop GI: soft, non-tender; bowel sounds normal; no masses,  no organomegaly Extremities: extremities normal, atraumatic, no cyanosis or edema Neurologic: Alert and oriented X 3, normal strength and tone. Normal symmetric reflexes. Normal coordination and gait  ECOG PERFORMANCE STATUS: 1 - Symptomatic but completely ambulatory  Blood pressure 107/61, pulse 94, temperature 98 F (36.7 C), temperature source Oral, resp. rate 18, height _0  (1.651 m), weight 157 lb 1.6 oz (71.26 kg), SpO2 99 %.  LABORATORY DATA: Lab Results  Component Value Date   WBC 7.3 11/21/2015   HGB 11.1* 11/21/2015   HCT 34.0* 11/21/2015   MCV 91.4 11/21/2015   PLT 194 11/21/2015      Chemistry      Component Value Date/Time   NA 139 11/03/2015 1215   NA 139 10/27/2015 0825   NA 139 10/27/2015   K 4.3 11/03/2015 1215   K 4.6 10/27/2015 0825   CL 97* 10/27/2015 0825   CO2 29 11/03/2015 1215   CO2 27 10/27/2015 0825   BUN 14.3 11/03/2015 1215   BUN 28* 10/27/2015 0825   BUN 28* 10/27/2015   CREATININE 3.6* 11/03/2015 1215   CREATININE 7.40* 10/27/2015 0825   CREATININE 7.4* 10/27/2015   GLU 76 10/27/2015      Component Value Date/Time   CALCIUM 9.7 11/03/2015 1215   CALCIUM 8.7* 10/27/2015 0825   ALKPHOS 100 11/03/2015 1215   ALKPHOS 97 10/20/2015 1638   AST 24 11/03/2015 1215   AST 35 10/20/2015 1638   ALT 33 11/03/2015 1215   ALT 27 10/20/2015 1638   BILITOT 0.32 11/03/2015 1215   BILITOT 1.8* 10/20/2015 1638       RADIOGRAPHIC STUDIES: Mr Brain  Wo Contrast  11/18/2015  CLINICAL DATA:  Squamous cell lung cancer.  Staging. EXAM: MRI HEAD WITHOUT CONTRAST TECHNIQUE: Multiplanar, multiecho pulse sequences of the brain and surrounding structures were obtained without intravenous contrast. COMPARISON:  None. FINDINGS: Study done without contrast due to low GFR (13). Sensitivity for the detection of metastatic disease is diminished. No evidence for acute infarction, hemorrhage, mass lesion, hydrocephalus, or extra-axial fluid. Mild cerebral and cerebellar  atrophy. T2 and FLAIR hyperintensities throughout the white matter, likely chronic microvascular ischemic change. Flow voids are maintained throughout the carotid, and vertebral arteries. Severe basilar stenosis redemonstrated. There are no areas of chronic hemorrhage. Unremarkable pituitary and cerebellar tonsils. No definite upper cervical lesions, although slight motion degradation. Extracranial soft tissues unremarkable. Compared with priors, a similar appearance is noted. IMPRESSION: Noncontrast examination of the brain is negative for metastatic disease. Mild atrophy and small vessel disease, similar to priors. No mass lesion or vasogenic edema. Electronically Signed   By: Staci Righter M.D.   On: 11/18/2015 17:43   Nm Pet Image Initial (pi) Skull Base To Thigh  11/18/2015  CLINICAL DATA:  Initial treatment strategy for Lung cancer. EXAM: NUCLEAR MEDICINE PET SKULL BASE TO THIGH TECHNIQUE: 7.3 mCi F-18 FDG was injected intravenously. Full-ring PET imaging was performed from the skull base to thigh after the radiotracer. CT data was obtained and used for attenuation correction and anatomic localization. FASTING BLOOD GLUCOSE:  Value: 90 mg/dl COMPARISON:  10/22/15 FINDINGS: NECK No hypermetabolic lymph nodes in the neck. CHEST The right upper lobe lung mass which abuts the visceral pleura measures 4.6 cm and has an SUV max equal to 29, image 16 of series 8. Tiny tree-in-bud nodules are identified  scattered throughout the right upper lobe. These are nonspecific, too small to characterize by PET-CT but are favored to represent changes of postobstructive pneumonitis. The tiny nodule in the left upper lobe measures 6 mm and is too small to characterize by PET-CT, image 15 of series 8. Malignant range FDG uptake is associated with the right hilar lymph node mass within SUV max equal to 18.52. There is a right paratracheal lymph node which measures 8 mm and has an SUV max equal to 4.36. Diffuse increased uptake is identified throughout the esophagus. Suspect esophagitis. There is a morphologically benign appearing lymph node in the right axilla which exhibits mild increased uptake within SUV max equal to 10.68. ABDOMEN/PELVIS No abnormal hypermetabolic activity within the liver, pancreas, adrenal glands, or spleen. No hypermetabolic lymph nodes in the abdomen or pelvis. SKELETON No focal hypermetabolic activity to suggest skeletal metastasis. IMPRESSION: 1. There is intense FDG uptake associated with right upper lobe lung mass which extends to the ventral pleura. 2. Hypermetabolic right hilar and right paratracheal lymph nodes worrisome for metastatic adenopathy. Evidence of postobstructive pneumonitis within the right upper lobe is noted. 3. There is moderate radiotracer uptake localizing to the area of the mid esophagus, which is favored to represent esophagitis. 4. No evidence to suggest bone metastasis or metastatic disease to the abdomen or pelvis. Electronically Signed   By: Kerby Moors M.D.   On: 11/18/2015 15:36    ASSESSMENT AND PLAN: This is a very pleasant 75 years old African-American female with recently diagnosed as stage IIIA non-small cell lung cancer, squamous cell carcinoma. PDL 1 expression is 1% I had a lengthy discussion with the patient today about her current disease is stage, prognosis and treatment options. I recommended for the patient a course of concurrent chemoradiation. I  recommended for the patient a course of concurrent chemoradiation with weekly carboplatin for AUC of 2 and paclitaxel 45 MG/M2. I discussed with the patient adverse effect of the chemotherapy including but not limited to alopecia, myelosuppression, nausea and vomiting, peripheral neuropathy, liver or renal dysfunction. The patient would like a second opinion at [facility] for evaluation of her condition before proceeding with the treatment. I will order referral and would  wait from the patient regarding whether she will receive treatment in [place] or at [place]. I did not give her a follow-up appointment for now until she makes a decision. She was advised to call if she has any concerning symptoms in the interval. The patient voices understanding of current disease status and treatment options and is in agreement with the current care plan.  All questions were answered. The patient knows to call the clinic with any problems, questions or concerns. We can certainly see the patient much sooner if necessary.  I spent 15 minutes counseling the patient face to face. The total time spent in the appointment was 25 minutes.  Disclaimer: This note was dictated with voice recognition software. Similar sounding words can inadvertently be transcribed and may not be corrected upon review.

## 2015-11-21 NOTE — Progress Notes (Signed)
Oncology Nurse Navigator Documentation  Oncology Nurse Navigator Flowsheets 11/21/2015  Navigator Encounter Type Clinic/MDC  Patient Visit Type MedOnc  Treatment Phase Follow-up;Pre-Tx/Tx Discussion  Barriers/Navigation Needs Coordination of Care  Interventions Coordination of Care  Coordination of Care Appts  Acuity Level 2  Acuity Level 2 Other  Time Spent with Patient 62   Spoke with patient and family today at [facility].  Patient would like second opinion.  Dr. Julien Nordmann requested in [facility].  I will notify HIM team of patients request to help expedite appt.

## 2015-11-21 NOTE — Telephone Encounter (Signed)
TC from patient with the name of the MD @ [facility] she would like her second opinion from.  His name is Dr. Melton Alar. The phone # [phone].  She would like to have an appt there as soon as possible.

## 2015-11-22 ENCOUNTER — Ambulatory Visit: Admission: RE | Admit: 2015-11-22 | Payer: Medicare Other | Source: Ambulatory Visit | Admitting: Radiation Oncology

## 2015-11-22 ENCOUNTER — Ambulatory Visit
Admission: RE | Admit: 2015-11-22 | Discharge: 2015-11-22 | Disposition: A | Discharge status: Home / Self Care | Payer: Medicare Other | Source: Ambulatory Visit | Attending: Radiation Oncology | Admitting: Radiation Oncology

## 2015-11-22 ENCOUNTER — Encounter: Payer: Self-pay | Admitting: *Deleted

## 2015-11-22 ENCOUNTER — Ambulatory Visit: Payer: Medicare Other

## 2015-11-22 ENCOUNTER — Telehealth: Payer: Self-pay | Admitting: Internal Medicine

## 2015-11-22 NOTE — Telephone Encounter (Signed)
Faxed pt medical records to [facility].  Ordered scans and slides to be fedex'ed to [facility]

## 2015-11-22 NOTE — Progress Notes (Signed)
Oncology Nurse Navigator Documentation  Oncology Nurse Navigator Flowsheets 11/22/2015  Navigator Encounter Type Other  Patient Visit Type Other  Barriers/Navigation Needs Coordination of Care  Interventions Coordination of Care  Coordination of Care Appts  Acuity Level 1  Time Spent with Patient 15   I checked on [name] [name] referral to [place] has faxed records and had slides sent to [facility].  I called Maudie Mercury to see about appt.  I left vm message for her to call me.

## 2015-11-23 ENCOUNTER — Telehealth: Payer: Self-pay | Admitting: *Deleted

## 2015-11-23 ENCOUNTER — Ambulatory Visit: Payer: Medicare Other | Admitting: Radiation Oncology

## 2015-11-23 NOTE — Telephone Encounter (Signed)
Returned call to pt requesting phone # for [facility] for 2nd opinion. Gave pt number to Information desk at [place] No further concerns.

## 2015-11-24 ENCOUNTER — Other Ambulatory Visit: Payer: Self-pay | Admitting: Internal Medicine

## 2015-11-28 ENCOUNTER — Telehealth: Payer: Self-pay | Admitting: *Deleted

## 2015-11-28 NOTE — Telephone Encounter (Signed)
Oncology Nurse Navigator Documentation  Oncology Nurse Navigator Flowsheets 11/28/2015  Navigator Encounter Type Telephone  Telephone Outgoing Call  Patient Visit Type Follow-up  Barriers/Navigation Needs Coordination of Care  Interventions Coordination of Care  Coordination of Care Appts  Acuity Level 1  Time Spent with Patient 15   I called [name] [name] to see if she had an appt at [facility].  She states she does this Friday 12/02/15 with [facility].  I asked that she call me back after her appt to see she would like to receive tx.  She stated she would call back.

## 2015-12-03 ENCOUNTER — Other Ambulatory Visit: Payer: Self-pay | Admitting: Internal Medicine

## 2015-12-05 ENCOUNTER — Ambulatory Visit (INDEPENDENT_AMBULATORY_CARE_PROVIDER_SITE_OTHER): Payer: Medicare Other | Admitting: Podiatry

## 2015-12-05 ENCOUNTER — Encounter: Payer: Self-pay | Admitting: Podiatry

## 2015-12-05 DIAGNOSIS — M79676 Pain in unspecified toe(s): Secondary | ICD-10-CM | POA: Diagnosis not present

## 2015-12-05 DIAGNOSIS — B351 Tinea unguium: Secondary | ICD-10-CM | POA: Diagnosis not present

## 2015-12-05 DIAGNOSIS — I739 Peripheral vascular disease, unspecified: Secondary | ICD-10-CM

## 2015-12-05 DIAGNOSIS — L84 Corns and callosities: Secondary | ICD-10-CM

## 2015-12-06 NOTE — Progress Notes (Signed)
Patient ID: [name] [name], female   DOB: 1940/12/29, 75 y.o.   MRN: [id number]  Subjective:  patient presents the office today for complaints of thick, painful, elongated toenails which she cannot cut herself. Since last appointment she is been diagnosed with lung cancer as well. She denies any redness or drainage from toes. No other complaints.  Objective: AAO 3, NAD DP/PT pulses slightly palpable Protective sensation decreased with Simms Weinstein monofilament Nails are hypertrophic, dystrophic, brittle, discolored, elongated to the left third, fourth, fifth digit in the right hallux, third, fourth, fifth digit. There is tenderness on palpation on the nails. There is no surrounding erythema or drainage. There is a hyperkeratotic lesion distal aspect of the right third toe. Upon debridement no underlying ulceration, drainage or other sign of infection. There are no areas of pinpoint bony tenderness or pain the vibratory sensation of bilateral lower extremities suggest that she has pain overlying her toes although is no pain upon palpation of this time. She has a previous amputation of the left hallux and second toe as well as the right second toe. No other open lesions or pre-ulcer lesions identified bilaterally. No pain with calf compression, swelling, warmth, erythema.   Assessment:  75 year old female with symptomatic onychomycosis, hyperkeratotic lesion   Plan: -X-rays were obtained and reviewed with the patient.  -Treatment options discussed including all alternatives, risks, and complications -Nails are sharply debrided without complication/bleeding 7. -Hyperkeratotic lesion 1 sharply debrided without complication/bleeding -F/U 3 months  Celesta Gentile, DPM

## 2015-12-08 LAB — ACID FAST CULTURE WITH REFLEXED SENSITIVITIES: ACID FAST CULTURE - AFSCU3: NEGATIVE

## 2015-12-13 ENCOUNTER — Ambulatory Visit: Payer: Medicare Other | Admitting: Internal Medicine

## 2015-12-23 ENCOUNTER — Telehealth: Payer: Self-pay | Admitting: *Deleted

## 2015-12-23 NOTE — Telephone Encounter (Signed)
Oncology Nurse Navigator Documentation  Oncology Nurse Navigator Flowsheets 12/23/2015  Navigator Encounter Type Telephone  Telephone Outgoing Call  Treatment Phase Other  Barriers/Navigation Needs Coordination of Care  Interventions Other  Acuity Level 1  Acuity Level 1 Minimal follow up required  Time Spent with Patient 15   I called to follow up with [name] [name].  She is doing well.  She stated she is receiving tx at [facility]  I stated if she needed our assistance to call.  She was thankful for checking up with her.

## 2016-01-10 ENCOUNTER — Other Ambulatory Visit: Payer: Self-pay | Admitting: Internal Medicine

## 2016-01-13 ENCOUNTER — Telehealth: Payer: Self-pay | Admitting: Internal Medicine

## 2016-01-13 MED ORDER — CLONAZEPAM 1 MG PO TABS: 1.0000 mg | ORAL_TABLET | Freq: Every day | ORAL | Status: DC

## 2016-01-13 MED ORDER — ONDANSETRON HCL 8 MG PO TABS: 8.0000 mg | ORAL_TABLET | Freq: Four times a day (QID) | ORAL | Status: AC | PRN

## 2016-01-13 MED ORDER — OMEPRAZOLE 40 MG PO CPDR: 40.0000 mg | DELAYED_RELEASE_CAPSULE | Freq: Every day | ORAL | Status: AC

## 2016-01-13 MED ORDER — DULOXETINE HCL 30 MG PO CPEP: 60.0000 mg | ORAL_CAPSULE | Freq: Every day | ORAL | Status: DC

## 2016-01-13 MED ORDER — HYDROXYZINE HCL 25 MG PO TABS: ORAL_TABLET | ORAL | Status: DC

## 2016-01-13 NOTE — Telephone Encounter (Signed)
i can't find recent well visit----are you ok with refilling or do you need office visit---please advise, thanks

## 2016-01-13 NOTE — Telephone Encounter (Signed)
Klonopin done hardcopy to [facility]  All rest of refills were done by me as well

## 2016-01-13 NOTE — Telephone Encounter (Signed)
clonazePAM (KLONOPIN) 1 MG tablet [[id number]]  DULoxetine (CYMBALTA) 30 MG capsule [[id number]]  hydrOXYzine (ATARAX/VISTARIL) 25 MG tablet [[id number]]  omeprazole (PRILOSEC) 40 MG capsule [[id number]]  ondansetron (ZOFRAN) 8 MG tablet [[id number]]    Patient is [facility] that these meds [facility]

## 2016-01-16 NOTE — Telephone Encounter (Signed)
Faxed klonopin rx [facility] to [facility]...Johny Chess

## 2016-02-17 ENCOUNTER — Encounter: Payer: Self-pay | Admitting: Vascular Surgery

## 2016-02-22 ENCOUNTER — Encounter (HOSPITAL_COMMUNITY): Payer: Medicare Other

## 2016-02-22 ENCOUNTER — Ambulatory Visit: Payer: Medicare Other | Admitting: Vascular Surgery

## 2016-02-29 ENCOUNTER — Telehealth: Payer: Self-pay | Admitting: Emergency Medicine

## 2016-03-05 ENCOUNTER — Ambulatory Visit: Payer: Medicare Other | Admitting: Podiatry

## 2016-03-09 ENCOUNTER — Other Ambulatory Visit: Payer: Self-pay | Admitting: Internal Medicine

## 2016-03-09 NOTE — Telephone Encounter (Signed)
Flexeril done erx

## 2016-03-20 NOTE — Telephone Encounter (Signed)
error 

## 2016-03-26 IMAGING — CR DG ANKLE COMPLETE 3+V*R*
3 series · 3 of 3 positions shown · non-contrast
Comparison: 05/30/2015

CLINICAL DATA: Patient rolled right ankle a few days ago; lateral
right ankle pain and swelling

EXAM:
RIGHT ANKLE - COMPLETE 3+ VIEW

[t ankle joint ap right]
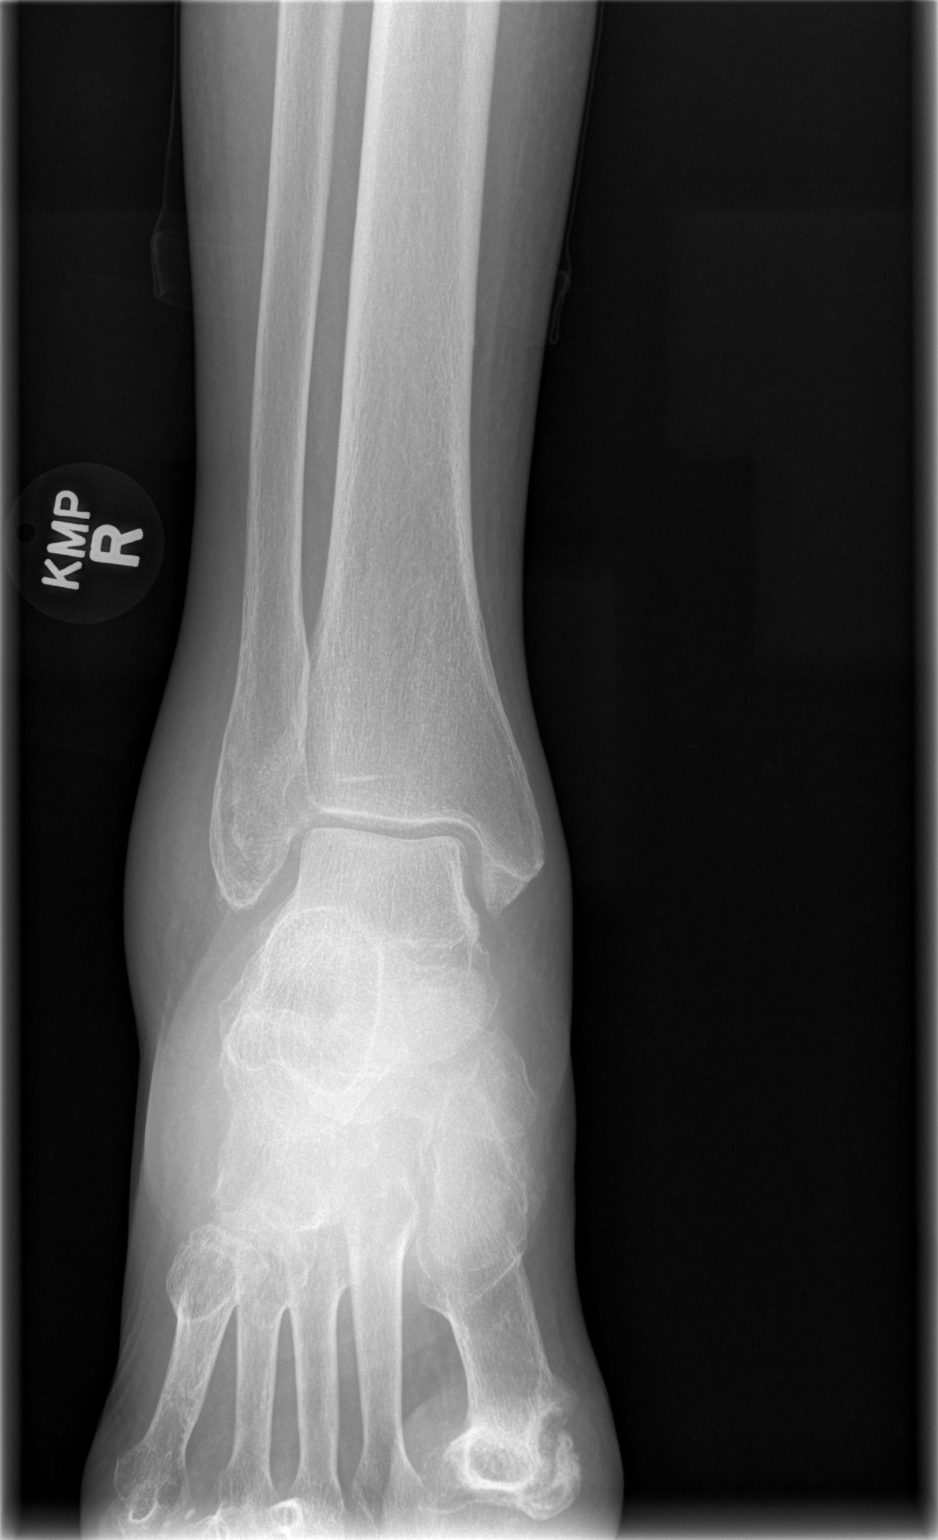

[t ankle joint oblique right]
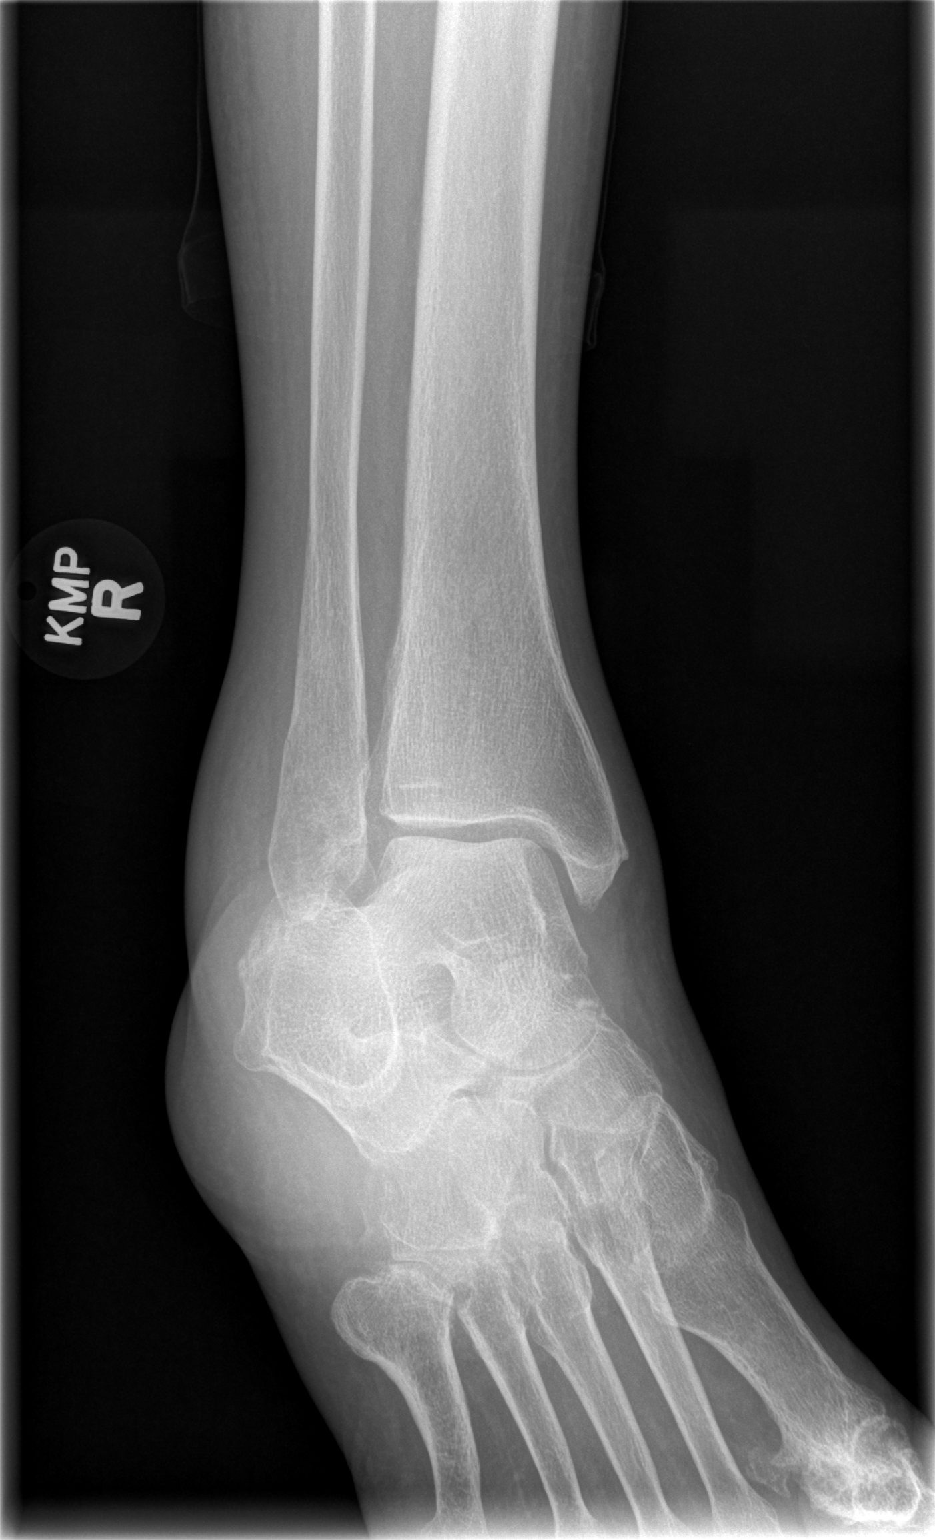

[t ankle joint lat right]
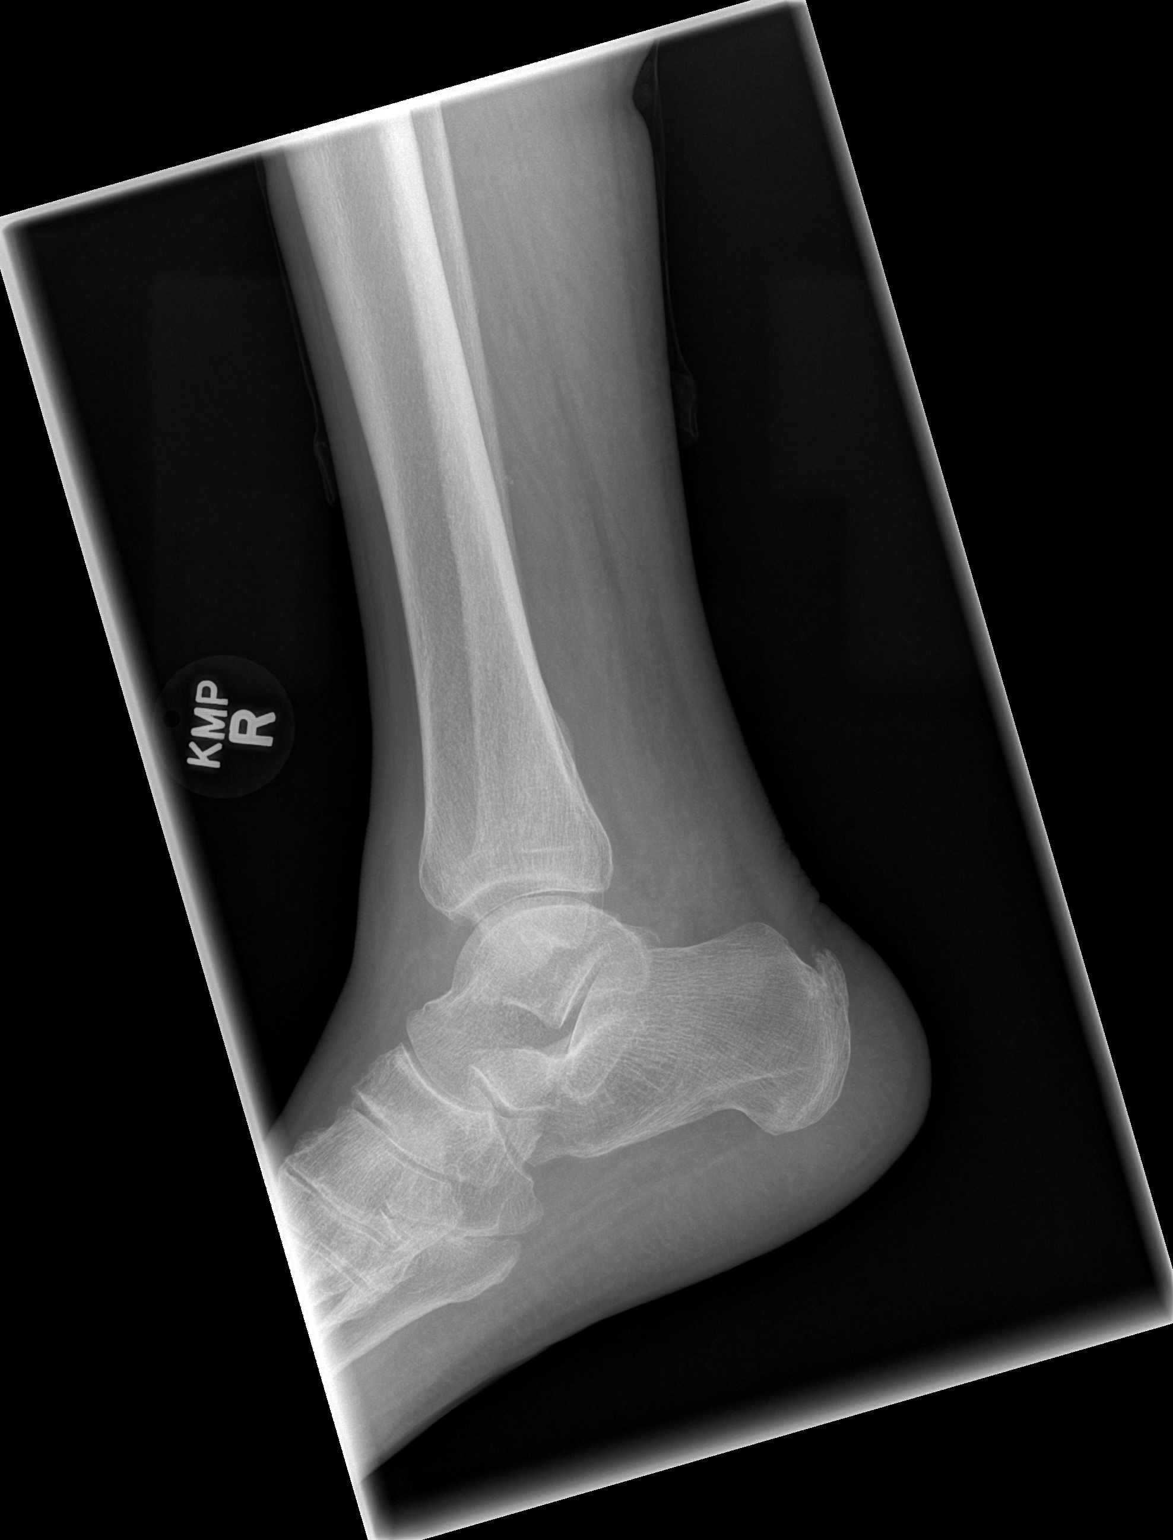

[3 of 3 positions shown; findings below may reference images not displayed]

FINDINGS: There is a subtle linear nondisplaced fracture of the distal fibula
in the region of the proximal metaphysis. Only the lateral cortical
disruption is defined, on the oblique view. No other evidence of a
fracture.

Ankle mortise is normally spaced and aligned.

Bones are demineralized. There is significant lateral soft tissue
swelling.
IMPRESSION: Subtle nondisplaced fracture of the distal fibula. No other
fractures. No dislocation.

## 2016-04-25 ENCOUNTER — Ambulatory Visit (HOSPITAL_COMMUNITY)
Admission: RE | Admit: 2016-04-25 | Discharge: 2016-04-25 | Disposition: A | Discharge status: Home / Self Care | Payer: Medicare Other | Source: Ambulatory Visit | Attending: Internal Medicine | Admitting: Internal Medicine

## 2016-04-25 DIAGNOSIS — D649 Anemia, unspecified: Secondary | ICD-10-CM | POA: Diagnosis present

## 2016-04-25 DIAGNOSIS — N186 End stage renal disease: Secondary | ICD-10-CM | POA: Diagnosis present

## 2016-04-25 LAB — PREPARE RBC (CROSSMATCH)

## 2016-04-25 LAB — ABO/RH: ABO/RH(D): O POS

## 2016-04-25 MED ORDER — ACETAMINOPHEN 325 MG PO TABS
325.0000 mg | ORAL_TABLET | Freq: Once | ORAL | Status: AC
  Administered 2016-04-25: 325 mg via ORAL
  Filled 2016-04-25: qty 1

## 2016-04-25 MED ORDER — SODIUM CHLORIDE 0.9 % IV SOLN
Freq: Once | INTRAVENOUS | Status: AC
  Administered 2016-04-25: 10:00:00 via INTRAVENOUS

## 2016-04-25 MED ORDER — DIPHENHYDRAMINE HCL 25 MG PO CAPS
25.0000 mg | ORAL_CAPSULE | Freq: Once | ORAL | Status: AC
  Administered 2016-04-25: 25 mg via ORAL
  Filled 2016-04-25: qty 1

## 2016-04-25 NOTE — Discharge Instructions (Signed)
Blood Transfusion, Care After  These instructions give you information about caring for yourself after your procedure. Your doctor may also give you more specific instructions. Call your doctor if you have any problems or questions after your procedure.   HOME CARE   Take medicines only as told by your doctor. Ask your doctor if you can take an over-the-counter pain reliever if you have a fever or headache a day or two after your procedure.   Return to your normal activities as told by your doctor.  GET HELP IF:    You develop redness or irritation at your IV site.   You have a fever, chills, or a headache that does not go away.   Your pee (urine) is darker than normal.   Your urine turns:    Pink.    Red.    Brown.   The white part of your eye turns yellow (jaundice).   You feel weak after doing your normal activities.  GET HELP RIGHT AWAY IF:    You have trouble breathing.   You have fever and chills and you also have:    Anxiety.    Chest or back pain.    Flushed or pink skin.    Clammy or sweaty skin.    A fast heartbeat.    A sick feeling in your stomach (nausea).     This information is not intended to replace advice given to you by your health care provider. Make sure you discuss any questions you have with your health care provider.     Document Released: 08/13/2014 Document Reviewed: 08/13/2014  Elsevier Interactive Patient Education 2016 [employer]

## 2016-04-25 NOTE — Progress Notes (Signed)
Patient ID: [name] [name], female   DOB: [birth year], 75 y.o.   MRN: [id number] Provider: Donato Heinz MD  Associated Diagnosis: Hgb; 6.6  Procedure: Transfusion of 1 unit PRBC over 3 hours.  Patient tolerated transfusion well. No reactions. Went over discharge instructions and copy given to patient. Alert, oriented and ambulatory to wheelchair at time of discharge. Discharged to home.

## 2016-04-26 LAB — TYPE AND SCREEN
ABO/RH(D): O POS
Antibody Screen: NEGATIVE
Unit division: 0

## 2016-04-27 ENCOUNTER — Encounter: Payer: Self-pay | Admitting: Vascular Surgery

## 2016-04-30 ENCOUNTER — Other Ambulatory Visit: Payer: Self-pay | Admitting: Internal Medicine

## 2016-05-01 ENCOUNTER — Telehealth: Payer: Self-pay

## 2016-05-01 MED ORDER — DULOXETINE HCL 30 MG PO CPEP: 60.0000 mg | ORAL_CAPSULE | Freq: Every day | ORAL | 1 refills | Status: AC

## 2016-05-01 MED ORDER — DULOXETINE HCL 30 MG PO CPEP: 60.0000 mg | ORAL_CAPSULE | Freq: Every day | ORAL | 1 refills | Status: DC

## 2016-05-01 MED ORDER — QUETIAPINE FUMARATE 100 MG PO TABS: 100.0000 mg | ORAL_TABLET | Freq: Every day | ORAL | 0 refills | Status: AC

## 2016-05-01 MED ORDER — AMLODIPINE BESYLATE 5 MG PO TABS: 5.0000 mg | ORAL_TABLET | Freq: Every day | ORAL | 3 refills | Status: DC

## 2016-05-01 NOTE — Telephone Encounter (Signed)
Medication refill sent to pharmacy  

## 2016-05-02 ENCOUNTER — Ambulatory Visit (HOSPITAL_COMMUNITY): Payer: Medicare Other

## 2016-05-02 ENCOUNTER — Ambulatory Visit: Payer: Medicare Other | Admitting: Vascular Surgery

## 2016-05-07 ENCOUNTER — Ambulatory Visit (INDEPENDENT_AMBULATORY_CARE_PROVIDER_SITE_OTHER): Payer: Medicare Other

## 2016-05-07 DIAGNOSIS — Z23 Encounter for immunization: Secondary | ICD-10-CM | POA: Diagnosis not present

## 2016-05-10 ENCOUNTER — Other Ambulatory Visit: Payer: Self-pay | Admitting: Internal Medicine

## 2016-05-11 ENCOUNTER — Other Ambulatory Visit: Payer: Self-pay | Admitting: Internal Medicine

## 2016-05-18 ENCOUNTER — Other Ambulatory Visit: Payer: Self-pay | Admitting: Internal Medicine

## 2016-05-29 ENCOUNTER — Other Ambulatory Visit: Payer: Self-pay | Admitting: Cardiovascular Disease

## 2016-05-29 ENCOUNTER — Ambulatory Visit
Admission: RE | Admit: 2016-05-29 | Discharge: 2016-05-29 | Disposition: A | Discharge status: Home / Self Care | Payer: Medicare Other | Source: Ambulatory Visit | Attending: Cardiovascular Disease | Admitting: Cardiovascular Disease

## 2016-05-29 DIAGNOSIS — R042 Hemoptysis: Secondary | ICD-10-CM

## 2016-06-20 ENCOUNTER — Encounter: Payer: Self-pay | Admitting: Vascular Surgery

## 2016-06-27 ENCOUNTER — Ambulatory Visit: Payer: Medicare Other | Admitting: Vascular Surgery

## 2016-06-27 ENCOUNTER — Encounter (HOSPITAL_COMMUNITY): Payer: Medicare Other

## 2016-07-03 ENCOUNTER — Telehealth (INDEPENDENT_AMBULATORY_CARE_PROVIDER_SITE_OTHER): Payer: Self-pay | Admitting: Physical Medicine and Rehabilitation

## 2016-07-03 NOTE — Telephone Encounter (Signed)
Patient has not been seen by Dr. Ernestina Patches for over one year. Scheduled for an office visit 07/11/16 at 1000.

## 2016-07-03 NOTE — Telephone Encounter (Signed)
Patient states she has been seen by [facility] for injections in her back a year or two ago.  She thinks that [place] had referred her.  She is having pain that she describes as a 7-10 on the pain scale and would like to schedule an appt.  She is a dialysis pt, but can schedule any day in the afternoon.

## 2016-07-09 ENCOUNTER — Ambulatory Visit (INDEPENDENT_AMBULATORY_CARE_PROVIDER_SITE_OTHER): Payer: Medicare Other | Admitting: Family

## 2016-07-09 DIAGNOSIS — M7542 Impingement syndrome of left shoulder: Secondary | ICD-10-CM | POA: Diagnosis not present

## 2016-07-09 MED ORDER — METHYLPREDNISOLONE ACETATE 40 MG/ML IJ SUSP
40.0000 mg | INTRAMUSCULAR | Status: AC | PRN
  Administered 2016-07-09: 40 mg via INTRA_ARTICULAR

## 2016-07-09 MED ORDER — LIDOCAINE HCL 1 % IJ SOLN
5.0000 mL | INTRAMUSCULAR | Status: AC | PRN
  Administered 2016-07-09: 5 mL

## 2016-07-09 NOTE — Progress Notes (Signed)
Office Visit Note   Patient: [name] [name]           Date of Birth: Jul 15, 1941           MRN: [id number] Visit Date: 07/09/2016              Requested by: Biagio Borg, MD [name] [name], [place] [address] PCP: Cathlean Cower, MD   Assessment & Plan: Visit Diagnoses:  1. Impingement syndrome of left shoulder     Plan: States she has epidural steroid injection scheduled for Wednesday. She'll follow up in the office with [place] as needed.  Follow-Up Instructions: Return if symptoms worsen or fail to improve.   Orders:  Orders Placed This Encounter  Procedures  . Large Joint Injection/Arthrocentesis   No orders of the defined types were placed in this encounter.     Procedures: Large Joint Inj Date/Time: 07/09/2016 12:49 PM Performed by: Dondra Prader RENEE Authorized by: Dondra Prader RENEE   Consent Given by:  Patient Site marked: the procedure site was marked   Timeout: prior to procedure the correct patient, procedure, and site was verified   Indications:  Pain and diagnostic evaluation Location:  Shoulder Site:  L subacromial bursa Prep: patient was prepped and draped in usual sterile fashion   Needle Size:  22 G Needle Length:  1.5 inches Ultrasound Guidance: No   Fluoroscopic Guidance: No   Arthrogram: No   Medications:  5 mL lidocaine 1 %; 40 mg methylPREDNISolone acetate 40 MG/ML Aspiration Attempted: No   Patient tolerance:  Patient tolerated the procedure well with no immediate complications     Clinical Data: No additional findings.   Subjective: No chief complaint on file.   Patient is a 75 year old woman seen today for evaluation of left shoulder pain status post arthroscopy. Last had a Depo-Medrol shot in May of this year. States this provided good relief until the last couple weeks. Has difficulty with above head reaching.    Review of Systems  Constitutional: Negative for chills and fever.     Objective: Vital Signs: There were  no vitals taken for this visit.  Physical Exam  Constitutional: She is oriented to person, place, and time. She appears well-developed and well-nourished.  Pulmonary/Chest: Effort normal.  Neurological: She is alert and oriented to person, place, and time.  Psychiatric: She has a normal mood and affect.  Nursing note reviewed.   Left Shoulder Exam   Tenderness  The patient is experiencing tenderness in the biceps tendon.  Range of Motion  Active Abduction: 90   Tests  Cross Arm: positive Impingement: positive  Other  Erythema: absent Pulse: present       Specialty Comments:  No specialty comments available.  Imaging: No results found.   PMFS History: Patient Active Problem List   Diagnosis Date Noted  . Squamous cell lung cancer ([place]) 10/26/2015  . Lung mass 10/22/2015  . Cough 06/15/2015  . Syncope 05/25/2015    Class: Acute  . Thrombocytopenia ([place]) 08/04/2014  . Acute encephalopathy 08/03/2014  . Non-compliant behavior 05/20/2014  . Pain of right upper arm 04/30/2014  . Other complications due to renal dialysis device, implant, and graft 04/30/2014  . Hyperparathyroidism, secondary renal ([place]) 04/12/2014  . Hyperkalemia 03/30/2014  . Hypoglycemia 03/30/2014  . ESRD on hemodialysis ([place]) 03/30/2014  . Anemia of renal disease 03/30/2014  . Peripheral neuropathy ([place]) 12/12/2013  . Hives 02/09/2013  . Thinning of skin 02/04/2013  . Secondary  renovascular hypertension, benign 01/06/2013  . Unspecified constipation 01/02/2013  . Muscle spasm 01/02/2013  . COPD (chronic obstructive pulmonary disease) ([place]) 01/02/2013  . Cancer of kidney ([place]) 10/07/2012  . Insomnia 10/07/2012  . Dysuria 01/24/2012  . N&V (nausea and vomiting) 01/24/2012  . Generalized weakness 08/17/2011  . Gait instability 08/17/2011  . UTI (lower urinary tract infection) 08/17/2011  . Renal failure (ARF), acute on chronic (HCC) 08/17/2011  . Bilateral knee pain 04/20/2011  .  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 Chronic neck pain 12/13/2010  . Osteopenia 12/13/2010  . Preventative health care 12/12/2010  . HEART MURMUR, HX OF 10/02/2010  . Palpitations 09/08/2010  . HYPERSOMNIA 05/26/2010  . Anxiety state 03/23/2010  . Memory loss 01/24/2010  . Backache 12/12/2009  . Personal history of colonic polyps 11/16/2009  . MENOPAUSAL DISORDER 05/25/2009  . SHOULDER PAIN, LEFT, CHRONIC 09/15/2008  . CERVICAL RADICULOPATHY, LEFT 05/27/2008  . Osteoarth NOS-L/Leg 03/11/2008  . 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 FATIGUE 09/17/2007  . Abnormality of gait 07/30/2007  . Gout 05/29/2007  . RESTLESS LEG SYNDROME 05/29/2007  . COMMON MIGRAINE 05/29/2007  . PERIPHERAL NEUROPATHY 05/29/2007  . Asthmatic bronchitis , chronic ([place]) 05/29/2007  . Peptic ulcer 05/29/2007  . Hypothyroidism 02/17/2007  . Impaired glucose tolerance 02/17/2007  . HLD (hyperlipidemia) 02/17/2007  . DEPRESSION 02/17/2007  . Essential hypertension 02/17/2007  . Peripheral vascular disease ([place]) 02/17/2007  . ALLERGIC RHINITIS 02/17/2007  . GERD 02/17/2007  . Seizure ([place]) 02/17/2007   Past Medical History:  Diagnosis Date  . ANEMIA-NOS 05/29/2007  . ANXIETY 03/23/2010  . Arthritis    "all over my body"  . Cancer of kidney ([place]) 10/07/2012   Followed per Dr Despina Pole, MD, urology, [place]   . Chronic bronchitis ([place])   . Chronic lower back pain   . Chronic neck pain 12/13/2010  . 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 COMMON MIGRAINE    "stress common migraines"  . Complication of anesthesia    after goiter removed-one vocal cord paralyzed  . Critical lower limb ischemia   . DEPRESSION 02/17/2007  . ESRD (end stage renal disease) on dialysis ([place])    "TTS. [address]" (10/20/2015)  . ESRD on hemodialysis ([place]) 02/17/2007   Started dialysis April 2014.  Gets HD at [facility] on TTS schedule.  Cause of ESRD was HTN.     . GERD (gastroesophageal reflux disease)     . GOUT 05/29/2007  . Heart murmur 10/02/2010   hx  . HYPERLIPIDEMIA 02/17/2007  . HYPERTENSION 02/17/2007  . HYPOTHYROIDISM 02/17/2007   s/p surgical removal of goiter in 1997  . Memory loss 01/24/2010  . OSTEOPENIA 09/22/2009  . Palpitations 09/08/2010  . PEPTIC ULCER DISEASE 05/29/2007   "when I was in college"  . PERIPHERAL NEUROPATHY 05/29/2007  . PERIPHERAL VASCULAR DISEASE 02/17/2007  . Personal history of colonic polyps 11/16/2009  . Renal insufficiency   . RESTLESS LEG SYNDROME 05/29/2007  . SEIZURE DISORDER 02/17/2007  . Type II diabetes mellitus ([place]) 02/17/2007   "haven't had it since ~ 2010" (10/20/2015)  . Vocal cord paralysis 1996    Family History  Problem Relation Age of Onset  . Dementia Mother   . Hypertension Mother   . Coronary artery disease Other   . Hyperlipidemia Other   . Hypertension Other   . Ovarian cancer Other   . Stroke Other   . Hypertension Sister   . Hypertension Brother   .  Heart attack Brother   . Stroke Brother     Past Surgical History:  Procedure Laterality Date  . AV FISTULA PLACEMENT  03/13/2012   Procedure: ARTERIOVENOUS (AV) FISTULA CREATION;  Surgeon: Conrad Robbins, MD;  Location: [place];  Service: Vascular;  Laterality: Right;  . BUNIONECTOMY Bilateral 1980  . CATARACT EXTRACTION, BILATERAL Bilateral    bilateral cataract removal  . ELECTROCARDIOGRAM  05/29/2006  . ESOPHAGOSCOPY W/ BOTOX INJECTION  07/22/2012   Procedure: ESOPHAGOSCOPY WITH BOTOX INJECTION;  Surgeon: Rozetta Nunnery, MD;  Location: [place];  Service: ENT;  Laterality: N/A;  esophageal dilation  . INSERTION OF DIALYSIS CATHETER N/A 02/05/2013   Procedure: INSERTION OF DIALYSIS CATHETER;  Surgeon: Angelia Mould, MD;  Location: [place];  Service: Vascular;  Laterality: N/A;  Ultrasound guided  . LAPAROSCOPIC CHOLECYSTECTOMY    . SHOULDER OPEN ROTATOR CUFF REPAIR Left    Dr. Sharol Given  . stress Cardiolite  06/18/2006  . THYROID SURGERY  1997   goiter  removal  . TOE AMPUTATION Left 2006  . TOE AMPUTATION Right Aug. 2015   2nd   . tranthoracic echocardiogram  06/18/2006  . VIDEO BRONCHOSCOPY Bilateral 10/25/2015   Procedure: VIDEO BRONCHOSCOPY WITH FLUORO;  Surgeon: Rigoberto Noel, MD;  Location: [place];  Service: Cardiopulmonary;  Laterality: Bilateral;   Social History   Occupational History  . disabled Retired    c-spine and back pain   Social History Main Topics  . Smoking status: Current Some Day Smoker    Packs/day: 0.25    Years: 51.00    Types: Cigarettes  . Smokeless tobacco: Never Used     Comment: USING E CIG  . Alcohol use No  . Drug use: No  . Sexual activity: Not Currently

## 2016-07-11 ENCOUNTER — Ambulatory Visit (INDEPENDENT_AMBULATORY_CARE_PROVIDER_SITE_OTHER): Payer: Medicare Other | Admitting: Physical Medicine and Rehabilitation

## 2016-07-16 ENCOUNTER — Other Ambulatory Visit: Payer: Self-pay | Admitting: Internal Medicine

## 2016-07-23 ENCOUNTER — Other Ambulatory Visit: Payer: Self-pay | Admitting: Internal Medicine

## 2016-08-17 ENCOUNTER — Other Ambulatory Visit: Payer: Self-pay | Admitting: Internal Medicine

## 2016-08-23 ENCOUNTER — Encounter: Payer: Medicare Other | Admitting: Internal Medicine

## 2016-08-27 ENCOUNTER — Ambulatory Visit (INDEPENDENT_AMBULATORY_CARE_PROVIDER_SITE_OTHER): Payer: Medicare Other | Admitting: Podiatry

## 2016-08-27 DIAGNOSIS — B351 Tinea unguium: Secondary | ICD-10-CM | POA: Diagnosis not present

## 2016-08-27 DIAGNOSIS — M79676 Pain in unspecified toe(s): Secondary | ICD-10-CM | POA: Diagnosis not present

## 2016-08-28 NOTE — Progress Notes (Signed)
Subjective:     Patient ID: [name] [name], female   DOB: [birth year], 76 y.o.   MRN: [id number]  HPI patient presents with painful nail disease and lesion formation that she cannot take care of   Review of Systems     Objective:   Physical Exam Neurovascular status unchanged with thick yellow brittle nailbeds and lesion formation that are painful    Assessment:     Mycotic nail infection and lesion formation    Plan:     Debride nailbeds 1-5 both feet and lesions with no iatrogenic bleeding noted

## 2016-08-30 LAB — HEPATITIS B SURFACE ANTIGEN
Hep B Core Total Ab: NEGATIVE
Hep B S Ab: NEGATIVE
Hepatitis B Surface Antigen: NEGATIVE

## 2016-10-17 ENCOUNTER — Other Ambulatory Visit: Payer: Self-pay

## 2016-10-19 ENCOUNTER — Ambulatory Visit (INDEPENDENT_AMBULATORY_CARE_PROVIDER_SITE_OTHER): Payer: Medicare Other | Admitting: Family

## 2016-10-22 ENCOUNTER — Encounter (HOSPITAL_COMMUNITY): Payer: Self-pay | Admitting: *Deleted

## 2016-10-22 ENCOUNTER — Ambulatory Visit (HOSPITAL_COMMUNITY)
Admission: RE | Admit: 2016-10-22 | Discharge: 2016-10-22 | Disposition: A | Discharge status: Home / Self Care | Payer: Medicare Other | Source: Ambulatory Visit | Attending: Vascular Surgery | Admitting: Vascular Surgery

## 2016-10-22 ENCOUNTER — Encounter (HOSPITAL_COMMUNITY)
Admission: RE | Disposition: A | Discharge status: Home / Self Care | Payer: Self-pay | Source: Ambulatory Visit | Attending: Vascular Surgery

## 2016-10-22 DIAGNOSIS — E1142 Type 2 diabetes mellitus with diabetic polyneuropathy: Secondary | ICD-10-CM | POA: Diagnosis not present

## 2016-10-22 DIAGNOSIS — K219 Gastro-esophageal reflux disease without esophagitis: Secondary | ICD-10-CM | POA: Insufficient documentation

## 2016-10-22 DIAGNOSIS — G2581 Restless legs syndrome: Secondary | ICD-10-CM | POA: Diagnosis not present

## 2016-10-22 DIAGNOSIS — J42 Unspecified chronic bronchitis: Secondary | ICD-10-CM | POA: Insufficient documentation

## 2016-10-22 DIAGNOSIS — Z992 Dependence on renal dialysis: Secondary | ICD-10-CM | POA: Diagnosis not present

## 2016-10-22 DIAGNOSIS — I120 Hypertensive chronic kidney disease with stage 5 chronic kidney disease or end stage renal disease: Secondary | ICD-10-CM | POA: Diagnosis not present

## 2016-10-22 DIAGNOSIS — E039 Hypothyroidism, unspecified: Secondary | ICD-10-CM | POA: Diagnosis not present

## 2016-10-22 DIAGNOSIS — Y832 Surgical operation with anastomosis, bypass or graft as the cause of abnormal reaction of the patient, or of later complication, without mention of misadventure at the time of the procedure: Secondary | ICD-10-CM | POA: Insufficient documentation

## 2016-10-22 DIAGNOSIS — M109 Gout, unspecified: Secondary | ICD-10-CM | POA: Insufficient documentation

## 2016-10-22 DIAGNOSIS — E1151 Type 2 diabetes mellitus with diabetic peripheral angiopathy without gangrene: Secondary | ICD-10-CM | POA: Diagnosis not present

## 2016-10-22 DIAGNOSIS — E785 Hyperlipidemia, unspecified: Secondary | ICD-10-CM | POA: Diagnosis not present

## 2016-10-22 DIAGNOSIS — E1122 Type 2 diabetes mellitus with diabetic chronic kidney disease: Secondary | ICD-10-CM | POA: Diagnosis not present

## 2016-10-22 DIAGNOSIS — G43009 Migraine without aura, not intractable, without status migrainosus: Secondary | ICD-10-CM | POA: Diagnosis not present

## 2016-10-22 DIAGNOSIS — F329 Major depressive disorder, single episode, unspecified: Secondary | ICD-10-CM | POA: Diagnosis not present

## 2016-10-22 DIAGNOSIS — Z8711 Personal history of peptic ulcer disease: Secondary | ICD-10-CM | POA: Diagnosis not present

## 2016-10-22 DIAGNOSIS — N186 End stage renal disease: Secondary | ICD-10-CM | POA: Diagnosis not present

## 2016-10-22 DIAGNOSIS — F17210 Nicotine dependence, cigarettes, uncomplicated: Secondary | ICD-10-CM | POA: Diagnosis not present

## 2016-10-22 DIAGNOSIS — M542 Cervicalgia: Secondary | ICD-10-CM | POA: Insufficient documentation

## 2016-10-22 DIAGNOSIS — T82858A Stenosis of vascular prosthetic devices, implants and grafts, initial encounter: Secondary | ICD-10-CM | POA: Diagnosis not present

## 2016-10-22 DIAGNOSIS — G40909 Epilepsy, unspecified, not intractable, without status epilepticus: Secondary | ICD-10-CM | POA: Diagnosis not present

## 2016-10-22 DIAGNOSIS — Z8249 Family history of ischemic heart disease and other diseases of the circulatory system: Secondary | ICD-10-CM | POA: Diagnosis not present

## 2016-10-22 DIAGNOSIS — Z85528 Personal history of other malignant neoplasm of kidney: Secondary | ICD-10-CM | POA: Insufficient documentation

## 2016-10-22 DIAGNOSIS — G8929 Other chronic pain: Secondary | ICD-10-CM | POA: Diagnosis not present

## 2016-10-22 DIAGNOSIS — F419 Anxiety disorder, unspecified: Secondary | ICD-10-CM | POA: Insufficient documentation

## 2016-10-22 DIAGNOSIS — M5440 Lumbago with sciatica, unspecified side: Secondary | ICD-10-CM | POA: Diagnosis not present

## 2016-10-22 DIAGNOSIS — T82898A Other specified complication of vascular prosthetic devices, implants and grafts, initial encounter: Secondary | ICD-10-CM

## 2016-10-22 HISTORY — PX: A/V FISTULAGRAM: CATH118298

## 2016-10-22 HISTORY — PX: PERIPHERAL VASCULAR BALLOON ANGIOPLASTY: CATH118281

## 2016-10-22 LAB — POCT I-STAT, CHEM 8
BUN: 77 mg/dL — AB (ref 6–20)
CALCIUM ION: 1 mmol/L — AB (ref 1.15–1.40)
CHLORIDE: 101 mmol/L (ref 101–111)
CREATININE: 8.5 mg/dL — AB (ref 0.44–1.00)
GLUCOSE: 95 mg/dL (ref 65–99)
HCT: 47 % — ABNORMAL HIGH (ref 36.0–46.0)
Hemoglobin: 16 g/dL — ABNORMAL HIGH (ref 12.0–15.0)
POTASSIUM: 6 mmol/L — AB (ref 3.5–5.1)
Sodium: 137 mmol/L (ref 135–145)
TCO2: 29 mmol/L (ref 0–100)

## 2016-10-22 LAB — POTASSIUM: Potassium: 6.1 mmol/L — ABNORMAL HIGH (ref 3.5–5.1)

## 2016-10-22 SURGERY — A/V FISTULAGRAM
Anesthesia: LOCAL | Laterality: Right

## 2016-10-22 MED ORDER — SODIUM POLYSTYRENE SULFONATE 15 GM/60ML PO SUSP: 30.0000 g | Freq: Once | ORAL | Status: DC

## 2016-10-22 MED ORDER — HEPARIN SODIUM (PORCINE) 1000 UNIT/ML IJ SOLN
INTRAMUSCULAR | Status: AC
  Filled 2016-10-22: qty 1

## 2016-10-22 MED ORDER — IODIXANOL 320 MG/ML IV SOLN
INTRAVENOUS | Status: DC | PRN
  Administered 2016-10-22: 23 mL

## 2016-10-22 MED ORDER — MIDAZOLAM HCL 2 MG/2ML IJ SOLN
INTRAMUSCULAR | Status: DC | PRN
  Administered 2016-10-22: 1 mg via INTRAVENOUS

## 2016-10-22 MED ORDER — FENTANYL CITRATE (PF) 100 MCG/2ML IJ SOLN
INTRAMUSCULAR | Status: DC | PRN
  Administered 2016-10-22: 50 ug via INTRAVENOUS

## 2016-10-22 MED ORDER — LIDOCAINE HCL (PF) 1 % IJ SOLN
INTRAMUSCULAR | Status: DC | PRN
  Administered 2016-10-22: 2 mL via SUBCUTANEOUS

## 2016-10-22 MED ORDER — LIDOCAINE HCL (PF) 1 % IJ SOLN
INTRAMUSCULAR | Status: AC
  Filled 2016-10-22: qty 30

## 2016-10-22 MED ORDER — MIDAZOLAM HCL 2 MG/2ML IJ SOLN
INTRAMUSCULAR | Status: AC
  Filled 2016-10-22: qty 2

## 2016-10-22 MED ORDER — SODIUM CHLORIDE 0.9% FLUSH: 3.0000 mL | INTRAVENOUS | Status: DC | PRN

## 2016-10-22 MED ORDER — SODIUM POLYSTYRENE SULFONATE 15 GM/60ML PO SUSP
45.0000 g | ORAL | Status: AC
  Administered 2016-10-22: 45 g via ORAL
  Filled 2016-10-22: qty 180

## 2016-10-22 MED ORDER — HEPARIN SODIUM (PORCINE) 1000 UNIT/ML IJ SOLN
INTRAMUSCULAR | Status: DC | PRN
  Administered 2016-10-22: 4000 [IU] via INTRAVENOUS

## 2016-10-22 MED ORDER — LIDOCAINE HCL (PF) 1 % IJ SOLN
INTRAMUSCULAR | Status: DC | PRN
  Administered 2016-10-22: 12:00:00

## 2016-10-22 MED ORDER — FENTANYL CITRATE (PF) 100 MCG/2ML IJ SOLN
INTRAMUSCULAR | Status: AC
  Filled 2016-10-22: qty 2

## 2016-10-22 SURGICAL SUPPLY — 17 items
BAG SNAP BAND KOVER 36X36 (MISCELLANEOUS) ×3 IMPLANT
BALLN ARMADA 8X60X80 (BALLOONS) ×3
BALLN MUSTANG 7.0X40 75 (BALLOONS) ×3
BALLOON ARMADA 8X60X80 (BALLOONS) IMPLANT
BALLOON MUSTANG 7.0X40 75 (BALLOONS) IMPLANT
COVER DOME SNAP 22 D (MISCELLANEOUS) ×3 IMPLANT
COVER PRB 48X5XTLSCP FOLD TPE (BAG) ×2 IMPLANT
COVER PROBE 5X48 (BAG) ×3
KIT ENCORE 26 ADVANTAGE (KITS) ×1 IMPLANT
PROTECTION STATION PRESSURIZED (MISCELLANEOUS) ×3
SHEATH PINNACLE R/O II 6F 4CM (SHEATH) ×1 IMPLANT
STATION PROTECTION PRESSURIZED (MISCELLANEOUS) ×2 IMPLANT
STOPCOCK MORSE 400PSI 3WAY (MISCELLANEOUS) ×3 IMPLANT
TRAY PV CATH (CUSTOM PROCEDURE TRAY) ×3 IMPLANT
TUBING CIL FLEX 10 FLL-RA (TUBING) ×3 IMPLANT
WIRE HITORQ VERSACORE ST 145CM (WIRE) ×1 IMPLANT
WIRE MINI STICK MAX (SHEATH) ×1 IMPLANT

## 2016-10-22 NOTE — Op Note (Signed)
   PATIENTOprah Camarena [name]   MRN: [id number] DOB: April 12, 1941    DATE OF PROCEDURE: 10/22/2016  INDICATIONS: [name] [name] is a 76 y.o. female with poor flows in her right upper arm fistula. She presents for fistulogram.  PROCEDURE:  1. Ultrasound-guided access to the right upper arm fistula 2. Fistulogram right upper arm fistula 3. Venoplasty of basilic vein stenosis  SURGEON: Judeth Cornfield. Scot Dock, MD, FACS  ANESTHESIA: Local with sedation   EBL: Minimal  TECHNIQUE: The patient was brought to the peripheral vascular lab and was sedated. The period of conscious sedation was 23 minutes.  During that time period, I was present face-to-face 100% of the time.  The patient was administered 1 mg of Versed and 25 grams of fentanyl. The patient's heart rate, blood pressure, and oxygen saturation were monitored by the nurse continuously during the procedure. The right upper extremity was prepped and draped in usual sterile fashion. Under ultrasound guidance, after the skin was anesthetized, the fistula was cannulated with a micropuncture needle and a micropuncture sheath introduced over the wire. Fistula grams obtained to evaluate the fistula from the point of cannulation to include the central veins. There was a 70% stenosis over about 3-4 cm. I elected to address this with venoplasty.  The micropuncture sheath was exchanged for a short 6 [place] sheath over a [employer] wire. I selected a 7 mm x 4 cm balloon which was inflated to 20 atm for 2 minutes. There was some mild residual stenosis. I went back with an 8 mm x 6 on a balloon which was inflated to 12 atm for 2 minutes. The balloon inflated a retrograde shot was performed to evaluate the proximal fistula. The balloon was then deflated. Completion films showed no residual stenosis. A 4-0 Monocryl was used to close the cannulation site. There was good hemostasis.  FINDINGS:  1. Widely patent right basilic vein transposition 2. 70% stenosis in midportion of  fistula which was successfully addressed with venoplasty as described above 3. Competing branch near the shoulder and also in the proximal fistula.  [name] Mayo, MD, FACS [facility]  DATE OF DICTATION:   10/22/2016

## 2016-10-22 NOTE — H&P (Signed)
Patient name: [name] [name] MRN: [id number] DOB: [birth year] Sex: female  REASON FOR CONSULT: Poor flow in right AVF  HPI: [name] [name] is a 76 y.o. female, who dialyzes on TTS at [facility].   SHe reportedly has had poor flows in her fistula and was set up for a fistulogram. She had a BVT (2nd stage) on 04/23/12.   Past Medical History:  Diagnosis Date  . ANEMIA-NOS 05/29/2007  . ANXIETY 03/23/2010  . Arthritis    "all over my body"  . Cancer of kidney ([place]) 10/07/2012   Followed per Dr Despina Pole, MD, urology, [place]   . Chronic bronchitis ([place])   . Chronic lower back pain   . Chronic neck pain 12/13/2010  . 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 COMMON MIGRAINE    "stress common migraines"  . Complication of anesthesia    after goiter removed-one vocal cord paralyzed  . Critical lower limb ischemia   . DEPRESSION 02/17/2007  . ESRD (end stage renal disease) on dialysis ([place])    "TTS. [address]" (10/20/2015)  . ESRD on hemodialysis ([place]) 02/17/2007   Started dialysis April 2014.  Gets HD at [facility] on TTS schedule.  Cause of ESRD was HTN.     . GERD (gastroesophageal reflux disease)   . GOUT 05/29/2007  . Heart murmur 10/02/2010   hx  . HYPERLIPIDEMIA 02/17/2007  . HYPERTENSION 02/17/2007  . HYPOTHYROIDISM 02/17/2007   s/p surgical removal of goiter in 1997  . Memory loss 01/24/2010  . OSTEOPENIA 09/22/2009  . Palpitations 09/08/2010  . PEPTIC ULCER DISEASE 05/29/2007   "when I was in college"  . PERIPHERAL NEUROPATHY 05/29/2007  . PERIPHERAL VASCULAR DISEASE 02/17/2007  . Personal history of colonic polyps 11/16/2009  . Renal insufficiency   . RESTLESS LEG SYNDROME 05/29/2007  . SEIZURE DISORDER 02/17/2007  . Type II diabetes mellitus ([place]) 02/17/2007   "haven't had it since ~ 2010" (10/20/2015)  . Vocal cord paralysis 1996    Family History  Problem Relation Age of Onset  . Dementia Mother   . Hypertension Mother   .  Coronary artery disease Other   . Hyperlipidemia Other   . Hypertension Other   . Ovarian cancer Other   . Stroke Other   . Hypertension Sister   . Hypertension Brother   . Heart attack Brother   . Stroke Brother     SOCIAL HISTORY: Social History   Social History  . Marital status: Single    Spouse name: N/A  . Number of children: N/A  . Years of education: N/A   Occupational History  . disabled Retired    c-spine and back pain   Social History Main Topics  . Smoking status: Current Some Day Smoker    Packs/day: 0.25    Years: 51.00    Types: Cigarettes  . Smokeless tobacco: Never Used     Comment: USING E CIG  . Alcohol use No  . Drug use: No  . Sexual activity: Not Currently   Other Topics Concern  . Not on file   Social History Narrative   Currently on disability for her back pain.  Lives alone, though has been thinking about moving to ALF/SNF recently.    Allergies  Allergen Reactions  . Cephalosporins Itching and Rash    PATIENT DENIES THIS REACTION - Vanc and fortaz given at the same time in June for several doses at dialysis with  systemic rash and itching; received zinacef 7/5 and had worseningsystemicrash/ itching and swelling of eyes - so unclear if allergic to either or both  . Nsaids Other (See Comments)    Renal dysfunction  . Pioglitazone Swelling    PATIENT DENIES THIS REACTON - edema  . Adhesive [Tape] Rash  . Vancomycin Rash    PATIENT DENIES THIS REACTON - See comment under cephalosporin    Current Facility-Administered Medications  Medication Dose Route Frequency Provider Last Rate Last Dose  . sodium chloride flush (NS) 0.9 % injection 3 mL  3 mL Intravenous PRN Angelia Mould, MD        REVIEW OF SYSTEMS:  '[X]'$  denotes positive finding, '[ ]'$  denotes negative finding Cardiac  Comments:  Chest pain or chest pressure:    Shortness of breath upon exertion:    Short of breath when lying flat:    Irregular heart rhythm:          Vascular    Pain in calf, thigh, or hip brought on by ambulation:    Pain in feet at night that wakes you up from your sleep:     Blood clot in your veins:    Leg swelling:         Pulmonary    Oxygen at home:    Productive cough:     Wheezing:         Neurologic    Sudden weakness in arms or legs:     Sudden numbness in arms or legs:     Sudden onset of difficulty speaking or slurred speech:    Temporary loss of vision in one eye:     Problems with dizziness:         Gastrointestinal    Blood in stool:     Vomited blood:         Genitourinary    Burning when urinating:     Blood in urine:        Psychiatric    Major depression:         Hematologic    Bleeding problems:    Problems with blood clotting too easily:        Skin    Rashes or ulcers:        Constitutional    Fever or chills:      PHYSICAL EXAM: Vitals:   10/22/16 0747  Pulse: 90  Temp: 98.3 F (36.8 C)  TempSrc: Oral  SpO2: 98%  Weight: 148 lb (67.1 kg)  Height: 5' 7.5" (1.715 m)    GENERAL: The patient is a well-nourished female, in no acute distress. The vital signs are documented above. CARDIAC: There is a regular rate and rhythm.  VASCULAR: Weak thrill in right AVF PULMONARY: There is good air exchange bilaterally without wheezing or rales. ABDOMEN: Soft and non-tender with normal pitched bowel sounds.  MUSCULOSKELETAL: There are no major deformities or cyanosis. NEUROLOGIC: No focal weakness or paresthesias are detected. SKIN: There are no ulcers or rashes noted. PSYCHIATRIC: The patient has a normal affect.  MEDICAL ISSUES:  POORLY FUNCTIONING RIGHT AVF: For fistulogram and possible venoplasty of right AVF. Procedure and risks discussed with patient who is agreeable to proceed.    Deitra Mayo [facility] [phone]

## 2016-10-22 NOTE — Discharge Instructions (Signed)
Fistulogram, Care After °Refer to this sheet in the next few weeks. These instructions provide you with information on caring for yourself after your procedure. Your health care provider may also give you more specific instructions. Your treatment has been planned according to current medical practices, but problems sometimes occur. Call your health care provider if you have any problems or questions after your procedure. °What can I expect after the procedure? °After your procedure, it is typical to have the following: °· A small amount of discomfort in the area where the catheters were placed. °· A small amount of bruising around the fistula. °· Sleepiness and fatigue. °Follow these instructions at home: °· Rest at home for the day following your procedure. °· Do not drive or operate heavy machinery while taking pain medicine. °· Take medicines only as directed by your health care provider. °· Do not take baths, swim, or use a hot tub until your health care provider approves. You may shower 24 hours after the procedure or as directed by your health care provider. °· There are many different ways to close and cover an incision, including stitches, skin glue, and adhesive strips. Follow your health care provider's instructions on: °¨ Incision care. °¨ Bandage (dressing) changes and removal. °¨ Incision closure removal. °· Monitor your dialysis fistula carefully. °Contact a health care provider if: °· You have drainage, redness, swelling, or pain at your catheter site. °· You have a fever. °· You have chills. °Get help right away if: °· You feel weak. °· You have trouble balancing. °· You have trouble moving your arms or legs. °· You have problems with your speech or vision. °· You can no longer feel a vibration or buzz when you put your fingers over your dialysis fistula. °· The limb that was used for the procedure: °¨ Swells. °¨ Is painful. °¨ Is cold. °¨ Is discolored, such as blue or pale white. °This information  is not intended to replace advice given to you by your health care provider. Make sure you discuss any questions you have with your health care provider. °Document Released: 12/07/2013 Document Revised: 12/29/2015 Document Reviewed: 09/11/2013 °Elsevier Interactive Patient Education © 2017 [employer] ° °

## 2016-10-23 ENCOUNTER — Ambulatory Visit (INDEPENDENT_AMBULATORY_CARE_PROVIDER_SITE_OTHER): Payer: Medicare Other | Admitting: Orthopedic Surgery

## 2016-10-23 ENCOUNTER — Ambulatory Visit (INDEPENDENT_AMBULATORY_CARE_PROVIDER_SITE_OTHER): Payer: Medicare Other

## 2016-10-23 ENCOUNTER — Encounter (INDEPENDENT_AMBULATORY_CARE_PROVIDER_SITE_OTHER): Payer: Self-pay | Admitting: Orthopedic Surgery

## 2016-10-23 DIAGNOSIS — G8929 Other chronic pain: Secondary | ICD-10-CM

## 2016-10-23 DIAGNOSIS — M25562 Pain in left knee: Secondary | ICD-10-CM

## 2016-10-23 DIAGNOSIS — M7542 Impingement syndrome of left shoulder: Secondary | ICD-10-CM | POA: Diagnosis not present

## 2016-10-23 DIAGNOSIS — M79645 Pain in left finger(s): Secondary | ICD-10-CM | POA: Diagnosis not present

## 2016-10-23 MED ORDER — METHYLPREDNISOLONE ACETATE 40 MG/ML IJ SUSP
40.0000 mg | INTRAMUSCULAR | Status: AC | PRN
  Administered 2016-10-23: 40 mg via INTRA_ARTICULAR

## 2016-10-23 MED ORDER — LIDOCAINE HCL 1 % IJ SOLN
5.0000 mL | INTRAMUSCULAR | Status: AC | PRN
  Administered 2016-10-23: 5 mL

## 2016-10-23 NOTE — Progress Notes (Signed)
Office Visit Note   Patient: [name] [name]           Date of Birth: [birth year]           MRN: [id number] Visit Date: 10/23/2016              Requested by: Biagio Borg, MD [place],  [address] PCP: Cathlean Cower, MD  Chief Complaint  Patient presents with  . Left Knee - Pain  . Left Thumb - Pain  . Left Shoulder - Pain    [id number] complains of a chronic left shoulder pain she has had injections in the past with relief she states his shoulder hurts while trying to sleep and with activities of daily living. Patient complains of chronic left knee pain lateral joint line and she is wearing a knee sleeve complains of catching popping denies swelling states that she has fallen due to her knee pain. Patient also complains of chronic left thumb pain at the carpometacarpal joint. HPI  Assessment & Plan: Visit Diagnoses:  1. Chronic pain of left knee   2. Impingement syndrome of left shoulder   3. Chronic pain of left thumb     Plan: After informed consent the left knee was injected from the anterior medial portal and the left shoulder was injected in the posterior portals he tolerated this well follow-up in 4 weeks. Patient would like to consider injection for her left thumb follow-up. Recommended that she wear a neoprene sleeve to help with her thumb symptoms.  Follow-Up Instructions: Return in about 4 weeks (around 11/20/2016).   Ortho Exam On examination patient is alert oriented no adenopathy well-dressed normal affect normal respiratory effort she has an antalgic gait. Examination patient has pain with Neer and Hawkins impingement test left shoulder she has crepitation with range of motion. Examination the left knee she is tender to palpation lateral joint line) cruciate are stable there is no effusion no redness no cellulitis. Examination of the left thumb she has swelling around the carpometacarpal joint she is point tender to palpation of the carpometacarpal  joint left thumb I Finkelstein's test is negative. ROS: Review of systems all other systems reviewed and negative. Imaging: No results found.  Labs: Lab Results  Component Value Date   HGBA1C 5.0 03/01/2015   HGBA1C 6.1 (H) 03/31/2014   HGBA1C 5.3 10/14/2013   ESRSEDRATE 70 (H) 10/07/2012   ESRSEDRATE 43 (H) 01/24/2010   LABURIC 11.0 (H) 02/22/2011   REPTSTATUS 10/27/2015 FINAL 10/25/2015   GRAMSTAIN  10/25/2015    ABUNDANT WBC PRESENT,BOTH PMN AND MONONUCLEAR RARE SQUAMOUS EPITHELIAL CELLS PRESENT NO ORGANISMS SEEN Performed at [facility]  10/25/2015    YEAST CONSISTENT WITH CANDIDA SPECIES Performed at [facility] 02/26/2007    Orders:  Orders Placed This Encounter  Procedures  . XR Knee 1-2 Views Left   No orders of the defined types were placed in this encounter.    Procedures: Large Joint Inj Date/Time: 10/23/2016 2:59 PM Performed by: DUDA, MARCUS V Authorized by: Newt Minion   Consent Given by:  Patient Site marked: the procedure site was marked   Timeout: prior to procedure the correct patient, procedure, and site was verified   Indications:  Pain and diagnostic evaluation Location:  Knee Site:  L knee Prep: patient was prepped and draped in usual sterile fashion   Needle Size:  22 G Needle Length:  1.5 inches Approach:  Anteromedial Ultrasound Guidance: No   Fluoroscopic Guidance: No   Arthrogram: No   Medications:  5 mL lidocaine 1 %; 40 mg methylPREDNISolone acetate 40 MG/ML Aspiration Attempted: No   Patient tolerance:  Patient tolerated the procedure well with no immediate complications Large Joint Inj Date/Time: 10/23/2016 2:59 PM Performed by: DUDA, MARCUS V Authorized by: Newt Minion   Consent Given by:  Patient Site marked: the procedure site was marked   Timeout: prior to procedure the correct patient, procedure, and site was verified   Indications:  Pain and diagnostic  evaluation Location:  Shoulder Site:  L subacromial bursa Prep: patient was prepped and draped in usual sterile fashion   Needle Size:  22 G Needle Length:  1.5 inches Approach:  Posterior Ultrasound Guidance: No   Fluoroscopic Guidance: No   Arthrogram: No   Medications:  5 mL lidocaine 1 %; 40 mg methylPREDNISolone acetate 40 MG/ML Aspiration Attempted: No   Patient tolerance:  Patient tolerated the procedure well with no immediate complications    Clinical Data: No additional findings.  Subjective: Review of Systems  Objective: Vital Signs: There were no vitals taken for this visit.  Specialty Comments:  No specialty comments available.  PMFS History: Patient Active Problem List   Diagnosis Date Noted  . Impingement syndrome of left shoulder 10/23/2016  . Chronic pain of left thumb 10/23/2016  . Squamous cell lung cancer ([place]) 10/26/2015  . Lung mass 10/22/2015  . Cough 06/15/2015  . Syncope 05/25/2015    Class: Acute  . Thrombocytopenia ([place]) 08/04/2014  . Acute encephalopathy 08/03/2014  . Non-compliant behavior 05/20/2014  . Pain of right upper arm 04/30/2014  . Other complications due to renal dialysis device, implant, and graft 04/30/2014  . Hyperparathyroidism, secondary renal ([place]) 04/12/2014  . Hyperkalemia 03/30/2014  . Hypoglycemia 03/30/2014  . ESRD on hemodialysis ([place]) 03/30/2014  . Anemia of renal disease 03/30/2014  . Peripheral neuropathy ([place]) 12/12/2013  . Hives 02/09/2013  . Thinning of skin 02/04/2013  . 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 Unspecified constipation 01/02/2013  . Muscle spasm 01/02/2013  . COPD (chronic obstructive pulmonary disease) ([place]) 01/02/2013  . Cancer of kidney ([place]) 10/07/2012  . Insomnia 10/07/2012  . Dysuria 01/24/2012  . N&V (nausea and vomiting) 01/24/2012  . Generalized weakness 08/17/2011  . Gait instability 08/17/2011  . UTI (lower urinary tract infection) 08/17/2011  . Renal  failure (ARF), acute on chronic (HCC) 08/17/2011  . Chronic pain of left knee 04/20/2011  . 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 Chronic neck pain 12/13/2010  . Osteopenia 12/13/2010  . Preventative health care 12/12/2010  . HEART MURMUR, HX OF 10/02/2010  . Palpitations 09/08/2010  . HYPERSOMNIA 05/26/2010  . Anxiety state 03/23/2010  . Memory loss 01/24/2010  . Backache 12/12/2009  . Personal history of colonic polyps 11/16/2009  . MENOPAUSAL DISORDER 05/25/2009  . SHOULDER PAIN, LEFT, CHRONIC 09/15/2008  . CERVICAL RADICULOPATHY, LEFT 05/27/2008  . Osteoarth NOS-L/Leg 03/11/2008  . 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 FATIGUE 09/17/2007  . Abnormality of gait 07/30/2007  . Gout 05/29/2007  . RESTLESS LEG SYNDROME 05/29/2007  . COMMON MIGRAINE 05/29/2007  . PERIPHERAL NEUROPATHY 05/29/2007  . Asthmatic bronchitis , chronic ([place]) 05/29/2007  . Peptic ulcer 05/29/2007  . Hypothyroidism 02/17/2007  . Impaired glucose tolerance 02/17/2007  . HLD (hyperlipidemia) 02/17/2007  . DEPRESSION 02/17/2007  . Essential hypertension 02/17/2007  . Peripheral vascular disease ([place]) 02/17/2007  . ALLERGIC RHINITIS 02/17/2007  .  GERD 02/17/2007  . Seizure ([place]) 02/17/2007   Past Medical History:  Diagnosis Date  . ANEMIA-NOS 05/29/2007  . ANXIETY 03/23/2010  . Arthritis    "all over my body"  . Cancer of kidney ([place]) 10/07/2012   Followed per Dr Despina Pole, MD, urology, [place]   . Chronic bronchitis ([place])   . Chronic lower back pain   . Chronic neck pain 12/13/2010  . 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 COMMON MIGRAINE    "stress common migraines"  . Complication of anesthesia    after goiter removed-one vocal cord paralyzed  . Critical lower limb ischemia   . DEPRESSION 02/17/2007  . ESRD (end stage renal disease) on dialysis ([place])    "TTS. [address]" (10/20/2015)  . ESRD on hemodialysis ([place]) 02/17/2007   Started dialysis April 2014.  Gets HD at  [facility] on TTS schedule.  Cause of ESRD was HTN.     . GERD (gastroesophageal reflux disease)   . GOUT 05/29/2007  . Heart murmur 10/02/2010   hx  . HYPERLIPIDEMIA 02/17/2007  . HYPERTENSION 02/17/2007  . HYPOTHYROIDISM 02/17/2007   s/p surgical removal of goiter in 1997  . Memory loss 01/24/2010  . OSTEOPENIA 09/22/2009  . Palpitations 09/08/2010  . PEPTIC ULCER DISEASE 05/29/2007   "when I was in college"  . PERIPHERAL NEUROPATHY 05/29/2007  . PERIPHERAL VASCULAR DISEASE 02/17/2007  . Personal history of colonic polyps 11/16/2009  . Renal insufficiency   . RESTLESS LEG SYNDROME 05/29/2007  . SEIZURE DISORDER 02/17/2007  . Type II diabetes mellitus ([place]) 02/17/2007   "haven't had it since ~ 2010" (10/20/2015)  . Vocal cord paralysis 1996    Family History  Problem Relation Age of Onset  . Dementia Mother   . Hypertension Mother   . Coronary artery disease Other   . Hyperlipidemia Other   . Hypertension Other   . Ovarian cancer Other   . Stroke Other   . Hypertension Sister   . Hypertension Brother   . Heart attack Brother   . Stroke Brother     Past Surgical History:  Procedure Laterality Date  . A/V SHUNTOGRAM N/A 10/22/2016   Procedure: A/V Fistulagram - Right Arm;  Surgeon: Angelia Mould, MD;  Location: [facility];  Service: Cardiovascular;  Laterality: N/A;  . AV FISTULA PLACEMENT  03/13/2012   Procedure: ARTERIOVENOUS (AV) FISTULA CREATION;  Surgeon: Conrad [place], MD;  Location: [facility];  Service: Vascular;  Laterality: Right;  . BUNIONECTOMY Bilateral 1980  . CATARACT EXTRACTION, BILATERAL Bilateral    bilateral cataract removal  . ELECTROCARDIOGRAM  05/29/2006  . ESOPHAGOSCOPY W/ BOTOX INJECTION  07/22/2012   Procedure: ESOPHAGOSCOPY WITH BOTOX INJECTION;  Surgeon: Rozetta Nunnery, MD;  Location: [facility];  Service: ENT;  Laterality: N/A;  esophageal dilation  . INSERTION OF DIALYSIS CATHETER N/A 02/05/2013   Procedure: INSERTION OF  DIALYSIS CATHETER;  Surgeon: Angelia Mould, MD;  Location: [facility];  Service: Vascular;  Laterality: N/A;  Ultrasound guided  . LAPAROSCOPIC CHOLECYSTECTOMY    . PERIPHERAL VASCULAR BALLOON ANGIOPLASTY Right 10/22/2016   Procedure: Peripheral Vascular Balloon Angioplasty;  Surgeon: Angelia Mould, MD;  Location: [facility];  Service: Cardiovascular;  Laterality: Right;  AV fistula  . SHOULDER OPEN ROTATOR CUFF REPAIR Left    Dr. Sharol Given  . stress Cardiolite  06/18/2006  . THYROID SURGERY  1997   goiter removal  . TOE  AMPUTATION Left 2006  . TOE AMPUTATION Right Aug. 2015   2nd   . tranthoracic echocardiogram  06/18/2006  . VIDEO BRONCHOSCOPY Bilateral 10/25/2015   Procedure: VIDEO BRONCHOSCOPY WITH FLUORO;  Surgeon: Rigoberto Noel, MD;  Location: [facility];  Service: Cardiopulmonary;  Laterality: Bilateral;   Social History   Occupational History  . disabled Retired    c-spine and back pain   Social History Main Topics  . Smoking status: Current Some Day Smoker    Packs/day: 0.25    Years: 51.00    Types: Cigarettes  . Smokeless tobacco: Never Used     Comment: USING E CIG  . Alcohol use No  . Drug use: No  . Sexual activity: Not Currently

## 2016-10-31 ENCOUNTER — Other Ambulatory Visit: Payer: Self-pay | Admitting: Internal Medicine

## 2016-11-08 ENCOUNTER — Other Ambulatory Visit: Payer: Self-pay | Admitting: Internal Medicine

## 2016-11-10 LAB — CBC AND DIFFERENTIAL: HEMOGLOBIN: 12 g/dL (ref 12.0–16.0)

## 2016-11-16 ENCOUNTER — Encounter (HOSPITAL_COMMUNITY): Payer: Self-pay | Admitting: Emergency Medicine

## 2016-11-16 ENCOUNTER — Inpatient Hospital Stay (HOSPITAL_COMMUNITY)
Admission: EM | Admit: 2016-11-16 | Discharge: 2016-11-24 | DRG: 492 | Disposition: A | Discharge status: Skilled Nursing Facility | Payer: Medicare Other | Attending: Family Medicine | Admitting: Family Medicine

## 2016-11-16 ENCOUNTER — Emergency Department (HOSPITAL_COMMUNITY): Payer: Medicare Other

## 2016-11-16 DIAGNOSIS — C3491 Malignant neoplasm of unspecified part of right bronchus or lung: Secondary | ICD-10-CM

## 2016-11-16 DIAGNOSIS — Z87891 Personal history of nicotine dependence: Secondary | ICD-10-CM

## 2016-11-16 DIAGNOSIS — Z85118 Personal history of other malignant neoplasm of bronchus and lung: Secondary | ICD-10-CM

## 2016-11-16 DIAGNOSIS — M8580 Other specified disorders of bone density and structure, unspecified site: Secondary | ICD-10-CM | POA: Diagnosis present

## 2016-11-16 DIAGNOSIS — J9601 Acute respiratory failure with hypoxia: Secondary | ICD-10-CM | POA: Diagnosis not present

## 2016-11-16 DIAGNOSIS — S82455A Nondisplaced comminuted fracture of shaft of left fibula, initial encounter for closed fracture: Secondary | ICD-10-CM | POA: Diagnosis not present

## 2016-11-16 DIAGNOSIS — E039 Hypothyroidism, unspecified: Secondary | ICD-10-CM

## 2016-11-16 DIAGNOSIS — J9690 Respiratory failure, unspecified, unspecified whether with hypoxia or hypercapnia: Secondary | ICD-10-CM

## 2016-11-16 DIAGNOSIS — Z9115 Patient's noncompliance with renal dialysis: Secondary | ICD-10-CM

## 2016-11-16 DIAGNOSIS — R0902 Hypoxemia: Secondary | ICD-10-CM

## 2016-11-16 DIAGNOSIS — N2581 Secondary hyperparathyroidism of renal origin: Secondary | ICD-10-CM | POA: Diagnosis present

## 2016-11-16 DIAGNOSIS — J188 Other pneumonia, unspecified organism: Secondary | ICD-10-CM | POA: Diagnosis not present

## 2016-11-16 DIAGNOSIS — N186 End stage renal disease: Secondary | ICD-10-CM | POA: Diagnosis not present

## 2016-11-16 DIAGNOSIS — Z89421 Acquired absence of other right toe(s): Secondary | ICD-10-CM

## 2016-11-16 DIAGNOSIS — R0602 Shortness of breath: Secondary | ICD-10-CM | POA: Diagnosis not present

## 2016-11-16 DIAGNOSIS — S82252A Displaced comminuted fracture of shaft of left tibia, initial encounter for closed fracture: Principal | ICD-10-CM | POA: Diagnosis present

## 2016-11-16 DIAGNOSIS — Y92009 Unspecified place in unspecified non-institutional (private) residence as the place of occurrence of the external cause: Secondary | ICD-10-CM

## 2016-11-16 DIAGNOSIS — D62 Acute posthemorrhagic anemia: Secondary | ICD-10-CM | POA: Diagnosis not present

## 2016-11-16 DIAGNOSIS — W010XXA Fall on same level from slipping, tripping and stumbling without subsequent striking against object, initial encounter: Secondary | ICD-10-CM | POA: Diagnosis present

## 2016-11-16 DIAGNOSIS — E785 Hyperlipidemia, unspecified: Secondary | ICD-10-CM | POA: Diagnosis present

## 2016-11-16 DIAGNOSIS — W19XXXA Unspecified fall, initial encounter: Secondary | ICD-10-CM

## 2016-11-16 DIAGNOSIS — I150 Renovascular hypertension: Secondary | ICD-10-CM | POA: Diagnosis present

## 2016-11-16 DIAGNOSIS — D638 Anemia in other chronic diseases classified elsewhere: Secondary | ICD-10-CM | POA: Diagnosis not present

## 2016-11-16 DIAGNOSIS — Z85528 Personal history of other malignant neoplasm of kidney: Secondary | ICD-10-CM

## 2016-11-16 DIAGNOSIS — J449 Chronic obstructive pulmonary disease, unspecified: Secondary | ICD-10-CM | POA: Diagnosis present

## 2016-11-16 DIAGNOSIS — Z79899 Other long term (current) drug therapy: Secondary | ICD-10-CM

## 2016-11-16 DIAGNOSIS — C3490 Malignant neoplasm of unspecified part of unspecified bronchus or lung: Secondary | ICD-10-CM | POA: Diagnosis present

## 2016-11-16 DIAGNOSIS — I959 Hypotension, unspecified: Secondary | ICD-10-CM | POA: Diagnosis present

## 2016-11-16 DIAGNOSIS — I120 Hypertensive chronic kidney disease with stage 5 chronic kidney disease or end stage renal disease: Secondary | ICD-10-CM | POA: Diagnosis present

## 2016-11-16 DIAGNOSIS — Z923 Personal history of irradiation: Secondary | ICD-10-CM | POA: Diagnosis not present

## 2016-11-16 DIAGNOSIS — R000 Tachycardia, unspecified: Secondary | ICD-10-CM | POA: Diagnosis present

## 2016-11-16 DIAGNOSIS — E875 Hyperkalemia: Secondary | ICD-10-CM | POA: Diagnosis not present

## 2016-11-16 DIAGNOSIS — J811 Chronic pulmonary edema: Secondary | ICD-10-CM | POA: Diagnosis present

## 2016-11-16 DIAGNOSIS — S82202A Unspecified fracture of shaft of left tibia, initial encounter for closed fracture: Secondary | ICD-10-CM | POA: Diagnosis not present

## 2016-11-16 DIAGNOSIS — S82402A Unspecified fracture of shaft of left fibula, initial encounter for closed fracture: Secondary | ICD-10-CM | POA: Diagnosis not present

## 2016-11-16 DIAGNOSIS — Z8249 Family history of ischemic heart disease and other diseases of the circulatory system: Secondary | ICD-10-CM

## 2016-11-16 DIAGNOSIS — T148XXA Other injury of unspecified body region, initial encounter: Secondary | ICD-10-CM | POA: Diagnosis not present

## 2016-11-16 DIAGNOSIS — Z823 Family history of stroke: Secondary | ICD-10-CM

## 2016-11-16 DIAGNOSIS — K219 Gastro-esophageal reflux disease without esophagitis: Secondary | ICD-10-CM | POA: Diagnosis present

## 2016-11-16 DIAGNOSIS — Z9221 Personal history of antineoplastic chemotherapy: Secondary | ICD-10-CM | POA: Diagnosis not present

## 2016-11-16 DIAGNOSIS — E890 Postprocedural hypothyroidism: Secondary | ICD-10-CM | POA: Diagnosis present

## 2016-11-16 DIAGNOSIS — Z881 Allergy status to other antibiotic agents status: Secondary | ICD-10-CM | POA: Diagnosis not present

## 2016-11-16 DIAGNOSIS — Z89422 Acquired absence of other left toe(s): Secondary | ICD-10-CM

## 2016-11-16 DIAGNOSIS — Z992 Dependence on renal dialysis: Secondary | ICD-10-CM

## 2016-11-16 DIAGNOSIS — Z7951 Long term (current) use of inhaled steroids: Secondary | ICD-10-CM

## 2016-11-16 DIAGNOSIS — J470 Bronchiectasis with acute lower respiratory infection: Secondary | ICD-10-CM | POA: Diagnosis not present

## 2016-11-16 DIAGNOSIS — J4489 Other specified chronic obstructive pulmonary disease: Secondary | ICD-10-CM | POA: Diagnosis present

## 2016-11-16 LAB — I-STAT CHEM 8, ED
BUN: 95 mg/dL — ABNORMAL HIGH (ref 6–20)
CREATININE: 16.2 mg/dL — AB (ref 0.44–1.00)
Calcium, Ion: 0.76 mmol/L — CL (ref 1.15–1.40)
Chloride: 101 mmol/L (ref 101–111)
GLUCOSE: 130 mg/dL — AB (ref 65–99)
HCT: 40 % (ref 36.0–46.0)
Hemoglobin: 13.6 g/dL (ref 12.0–15.0)
POTASSIUM: 6.9 mmol/L — AB (ref 3.5–5.1)
Sodium: 130 mmol/L — ABNORMAL LOW (ref 135–145)
TCO2: 20 mmol/L (ref 0–100)

## 2016-11-16 LAB — CBC WITH DIFFERENTIAL/PLATELET
Basophils Absolute: 0.1 10*3/uL (ref 0.0–0.1)
Basophils Relative: 0 %
Eosinophils Absolute: 0.5 10*3/uL (ref 0.0–0.7)
Eosinophils Relative: 4 %
HCT: 38.9 % (ref 36.0–46.0)
HEMOGLOBIN: 13.1 g/dL (ref 12.0–15.0)
LYMPHS ABS: 0.9 10*3/uL (ref 0.7–4.0)
LYMPHS PCT: 6 %
MCH: 29 pg (ref 26.0–34.0)
MCHC: 33.7 g/dL (ref 30.0–36.0)
MCV: 86.3 fL (ref 78.0–100.0)
Monocytes Absolute: 1.6 10*3/uL — ABNORMAL HIGH (ref 0.1–1.0)
Monocytes Relative: 11 %
NEUTROS ABS: 10.9 10*3/uL — AB (ref 1.7–7.7)
NEUTROS PCT: 79 %
Platelets: 349 10*3/uL (ref 150–400)
RBC: 4.51 MIL/uL (ref 3.87–5.11)
RDW: 18.1 % — ABNORMAL HIGH (ref 11.5–15.5)
WBC: 14 10*3/uL — AB (ref 4.0–10.5)

## 2016-11-16 MED ORDER — SODIUM BICARBONATE 8.4 % IV SOLN: 50.0000 meq | Freq: Once | INTRAVENOUS | Status: DC

## 2016-11-16 MED ORDER — SODIUM CHLORIDE 0.9 % IV SOLN
1.0000 g | Freq: Once | INTRAVENOUS | Status: AC
  Administered 2016-11-16: 1 g via INTRAVENOUS
  Filled 2016-11-16: qty 10

## 2016-11-16 MED ORDER — SODIUM CHLORIDE 0.9 % IV SOLN: 100.0000 mL | INTRAVENOUS | Status: DC | PRN

## 2016-11-16 MED ORDER — AMLODIPINE BESYLATE 5 MG PO TABS: 5.0000 mg | ORAL_TABLET | Freq: Every day | ORAL | Status: DC

## 2016-11-16 MED ORDER — HEPARIN SODIUM (PORCINE) 1000 UNIT/ML DIALYSIS: 1000.0000 [IU] | INTRAMUSCULAR | Status: DC | PRN

## 2016-11-16 MED ORDER — INSULIN ASPART 100 UNIT/ML IV SOLN: 10.0000 [IU] | Freq: Once | INTRAVENOUS | Status: DC

## 2016-11-16 MED ORDER — PREGABALIN 25 MG PO CAPS
50.0000 mg | ORAL_CAPSULE | Freq: Every day | ORAL | Status: DC
  Administered 2016-11-17 – 2016-11-23 (×7): 50 mg via ORAL
  Filled 2016-11-16 (×5): qty 2
  Filled 2016-11-16: qty 1
  Filled 2016-11-16 (×2): qty 2

## 2016-11-16 MED ORDER — VITAMIN D 1000 UNITS PO TABS
5000.0000 [IU] | ORAL_TABLET | Freq: Every day | ORAL | Status: DC
  Administered 2016-11-17 – 2016-11-24 (×7): 5000 [IU] via ORAL
  Filled 2016-11-16 (×7): qty 5

## 2016-11-16 MED ORDER — LEVOTHYROXINE SODIUM 175 MCG PO TABS: 175.0000 ug | ORAL_TABLET | Freq: Every day | ORAL | Status: DC

## 2016-11-16 MED ORDER — HYDROCODONE-ACETAMINOPHEN 5-325 MG PO TABS
1.0000 | ORAL_TABLET | Freq: Four times a day (QID) | ORAL | Status: DC | PRN
  Administered 2016-11-17: 2 via ORAL
  Administered 2016-11-17 – 2016-11-19 (×5): 1 via ORAL
  Administered 2016-11-20 – 2016-11-21 (×2): 2 via ORAL
  Administered 2016-11-21 – 2016-11-22 (×3): 1 via ORAL
  Filled 2016-11-16 (×5): qty 1
  Filled 2016-11-16: qty 2
  Filled 2016-11-16 (×2): qty 1
  Filled 2016-11-16: qty 2
  Filled 2016-11-16: qty 1
  Filled 2016-11-16: qty 2
  Filled 2016-11-16 (×2): qty 1

## 2016-11-16 MED ORDER — MOMETASONE FURO-FORMOTEROL FUM 100-5 MCG/ACT IN AERO
2.0000 | INHALATION_SPRAY | Freq: Two times a day (BID) | RESPIRATORY_TRACT | Status: DC
  Administered 2016-11-17 – 2016-11-23 (×9): 2 via RESPIRATORY_TRACT
  Filled 2016-11-16 (×2): qty 8.8

## 2016-11-16 MED ORDER — ALLOPURINOL 100 MG PO TABS
100.0000 mg | ORAL_TABLET | Freq: Every day | ORAL | Status: DC
  Administered 2016-11-17 – 2016-11-24 (×7): 100 mg via ORAL
  Filled 2016-11-16 (×7): qty 1

## 2016-11-16 MED ORDER — MORPHINE SULFATE (PF) 4 MG/ML IV SOLN: 0.5000 mg | INTRAVENOUS | Status: DC | PRN

## 2016-11-16 MED ORDER — CYCLOBENZAPRINE HCL 10 MG PO TABS
10.0000 mg | ORAL_TABLET | Freq: Three times a day (TID) | ORAL | Status: DC | PRN
  Administered 2016-11-17 – 2016-11-19 (×2): 10 mg via ORAL
  Filled 2016-11-16 (×2): qty 1

## 2016-11-16 MED ORDER — DM-GUAIFENESIN ER 30-600 MG PO TB12
1.0000 | ORAL_TABLET | Freq: Every day | ORAL | Status: DC
  Administered 2016-11-17 – 2016-11-24 (×7): 1 via ORAL
  Filled 2016-11-16 (×8): qty 1

## 2016-11-16 MED ORDER — MONTELUKAST SODIUM 10 MG PO TABS
10.0000 mg | ORAL_TABLET | Freq: Every day | ORAL | Status: DC
  Administered 2016-11-17 – 2016-11-23 (×7): 10 mg via ORAL
  Filled 2016-11-16 (×7): qty 1

## 2016-11-16 MED ORDER — PANTOPRAZOLE SODIUM 40 MG PO TBEC
40.0000 mg | DELAYED_RELEASE_TABLET | Freq: Every day | ORAL | Status: DC
  Administered 2016-11-17 – 2016-11-24 (×7): 40 mg via ORAL
  Filled 2016-11-16 (×7): qty 1

## 2016-11-16 MED ORDER — ALBUTEROL SULFATE (2.5 MG/3ML) 0.083% IN NEBU: 10.0000 mg | INHALATION_SOLUTION | Freq: Once | RESPIRATORY_TRACT | Status: DC

## 2016-11-16 MED ORDER — OXYCODONE HCL 5 MG PO TABS
5.0000 mg | ORAL_TABLET | ORAL | Status: DC | PRN
  Administered 2016-11-16: 5 mg via ORAL
  Filled 2016-11-16: qty 1

## 2016-11-16 MED ORDER — DULOXETINE HCL 60 MG PO CPEP
60.0000 mg | ORAL_CAPSULE | Freq: Every day | ORAL | Status: DC
  Administered 2016-11-17 – 2016-11-24 (×7): 60 mg via ORAL
  Filled 2016-11-16 (×7): qty 1

## 2016-11-16 MED ORDER — RENA-VITE PO TABS
1.0000 | ORAL_TABLET | Freq: Every day | ORAL | Status: DC
  Administered 2016-11-17 – 2016-11-23 (×8): 1 via ORAL
  Filled 2016-11-16 (×8): qty 1

## 2016-11-16 MED ORDER — PENTAFLUOROPROP-TETRAFLUOROETH EX AERO: 1.0000 "application " | INHALATION_SPRAY | CUTANEOUS | Status: DC | PRN

## 2016-11-16 MED ORDER — ALBUTEROL SULFATE (2.5 MG/3ML) 0.083% IN NEBU: 2.5000 mg | INHALATION_SOLUTION | Freq: Four times a day (QID) | RESPIRATORY_TRACT | Status: DC | PRN

## 2016-11-16 MED ORDER — LIDOCAINE HCL (PF) 1 % IJ SOLN: 5.0000 mL | INTRAMUSCULAR | Status: DC | PRN

## 2016-11-16 MED ORDER — MORPHINE SULFATE (PF) 4 MG/ML IV SOLN
4.0000 mg | Freq: Once | INTRAVENOUS | Status: AC
Start: 2016-11-16 — End: 2016-11-16
  Administered 2016-11-16: 4 mg via INTRAVENOUS
  Filled 2016-11-16: qty 1

## 2016-11-16 MED ORDER — ONDANSETRON HCL 4 MG/2ML IJ SOLN
4.0000 mg | Freq: Four times a day (QID) | INTRAMUSCULAR | Status: DC | PRN
  Administered 2016-11-24: 4 mg via INTRAVENOUS
  Filled 2016-11-16: qty 2

## 2016-11-16 MED ORDER — FENTANYL CITRATE (PF) 100 MCG/2ML IJ SOLN
50.0000 ug | Freq: Once | INTRAMUSCULAR | Status: AC
  Administered 2016-11-16: 50 ug via INTRAVENOUS
  Filled 2016-11-16: qty 2

## 2016-11-16 MED ORDER — LIDOCAINE-PRILOCAINE 2.5-2.5 % EX CREA: 1.0000 "application " | TOPICAL_CREAM | CUTANEOUS | Status: DC | PRN

## 2016-11-16 MED ORDER — SEVELAMER CARBONATE 800 MG PO TABS
3200.0000 mg | ORAL_TABLET | Freq: Three times a day (TID) | ORAL | Status: DC
  Administered 2016-11-17 – 2016-11-23 (×9): 3200 mg via ORAL
  Filled 2016-11-16 (×14): qty 4

## 2016-11-16 MED ORDER — MORPHINE SULFATE (PF) 4 MG/ML IV SOLN
1.0000 mg | INTRAVENOUS | Status: DC | PRN
  Administered 2016-11-16 – 2016-11-19 (×8): 1 mg via INTRAVENOUS
  Filled 2016-11-16 (×7): qty 1

## 2016-11-16 MED ORDER — ALTEPLASE 2 MG IJ SOLR: 2.0000 mg | Freq: Once | INTRAMUSCULAR | Status: DC | PRN

## 2016-11-16 MED ORDER — ATORVASTATIN CALCIUM 40 MG PO TABS
40.0000 mg | ORAL_TABLET | Freq: Every day | ORAL | Status: DC
  Administered 2016-11-17 – 2016-11-24 (×8): 40 mg via ORAL
  Filled 2016-11-16 (×8): qty 1

## 2016-11-16 MED ORDER — HYDRALAZINE HCL 20 MG/ML IJ SOLN: 5.0000 mg | INTRAMUSCULAR | Status: DC | PRN

## 2016-11-16 MED ORDER — CINACALCET HCL 30 MG PO TABS
30.0000 mg | ORAL_TABLET | Freq: Every day | ORAL | Status: DC
  Administered 2016-11-18 – 2016-11-22 (×5): 30 mg via ORAL
  Filled 2016-11-16 (×6): qty 1

## 2016-11-16 MED ORDER — QUETIAPINE FUMARATE 100 MG PO TABS
100.0000 mg | ORAL_TABLET | Freq: Every day | ORAL | Status: DC
  Administered 2016-11-17 – 2016-11-23 (×8): 100 mg via ORAL
  Filled 2016-11-16 (×8): qty 1

## 2016-11-16 MED ORDER — HEPARIN SODIUM (PORCINE) 5000 UNIT/ML IJ SOLN
5000.0000 [IU] | Freq: Three times a day (TID) | INTRAMUSCULAR | Status: DC
Start: 2016-11-17 — End: 2016-11-24
  Administered 2016-11-17 – 2016-11-24 (×16): 5000 [IU] via SUBCUTANEOUS
  Filled 2016-11-16 (×15): qty 1

## 2016-11-16 MED ORDER — CLONAZEPAM 1 MG PO TABS
1.0000 mg | ORAL_TABLET | Freq: Two times a day (BID) | ORAL | Status: DC | PRN
  Administered 2016-11-17 – 2016-11-21 (×2): 1 mg via ORAL
  Filled 2016-11-16 (×2): qty 1

## 2016-11-16 MED ORDER — HYDROXYZINE HCL 10 MG PO TABS
20.0000 mg | ORAL_TABLET | Freq: Every day | ORAL | Status: DC
  Administered 2016-11-17 – 2016-11-24 (×7): 20 mg via ORAL
  Filled 2016-11-16 (×8): qty 2

## 2016-11-16 MED ORDER — DEXTROSE 50 % IV SOLN: 1.0000 | Freq: Once | INTRAVENOUS | Status: DC

## 2016-11-16 MED ORDER — MORPHINE SULFATE (PF) 4 MG/ML IV SOLN
INTRAVENOUS | Status: AC
  Administered 2016-11-16: 21:00:00
  Filled 2016-11-16: qty 1

## 2016-11-16 MED ORDER — POLYETHYLENE GLYCOL 3350 17 G PO PACK
17.0000 g | PACK | Freq: Every day | ORAL | Status: DC | PRN
  Administered 2016-11-24: 17 g via ORAL
  Filled 2016-11-16: qty 1

## 2016-11-16 NOTE — Consult Note (Signed)
Reason for Consult: left tibia shaft fracture Referring Physician: ED MD  Dyllan L Escher is an 76 y.o. female.  HPI: The patient is a 76 year old female with multiple medical problems including end-stage renal disease who is on dialysis. She actually has not been to dialysis since last Saturday he do not feeling well and poorly getting over the death bursitis of another friend. She sustained an accidental mechanical fall today and had inability to ambulate. She was brought by EMS to the [facility] emergency room and found to have a left spiral tibial shaft fracture. Orthopedics is consult at to evaluate and treat the fracture. Also nephrology and the medical service have been consult at due to her knee for dialysis and medical management/clearance for this surgery. She does state that she is more comfortable in the splint since it was applied  Past Medical History:  Diagnosis Date  . ANEMIA-NOS 05/29/2007  . ANXIETY 03/23/2010  . Arthritis    "all over my body"  . Cancer of kidney ([place]) 10/07/2012   Followed per Dr Despina Pole, MD, urology, [place]   . Chronic bronchitis ([place])   . Chronic lower back pain   . Chronic neck pain 12/13/2010  . 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 COMMON MIGRAINE    "stress common migraines"  . Complication of anesthesia    after goiter removed-one vocal cord paralyzed  . Critical lower limb ischemia   . DEPRESSION 02/17/2007  . ESRD (end stage renal disease) on dialysis ([place])    "TTS. [address]" (10/20/2015)  . ESRD on hemodialysis ([place]) 02/17/2007   Started dialysis April 2014.  Gets HD at [facility] on TTS schedule.  Cause of ESRD was HTN.     . GERD (gastroesophageal reflux disease)   . GOUT 05/29/2007  . Heart murmur 10/02/2010   hx  . HYPERLIPIDEMIA 02/17/2007  . HYPERTENSION 02/17/2007  . HYPOTHYROIDISM 02/17/2007   s/p surgical removal of goiter in 1997  . Memory loss 01/24/2010  . OSTEOPENIA 09/22/2009  .  Palpitations 09/08/2010  . PEPTIC ULCER DISEASE 05/29/2007   "when I was in college"  . PERIPHERAL NEUROPATHY 05/29/2007  . PERIPHERAL VASCULAR DISEASE 02/17/2007  . Personal history of colonic polyps 11/16/2009  . Renal insufficiency   . RESTLESS LEG SYNDROME 05/29/2007  . SEIZURE DISORDER 02/17/2007  . Type II diabetes mellitus ([place]) 02/17/2007   "haven't had it since ~ 2010" (10/20/2015)  . Vocal cord paralysis 1996    Past Surgical History:  Procedure Laterality Date  . A/V SHUNTOGRAM N/A 10/22/2016   Procedure: A/V Fistulagram - Right Arm;  Surgeon: Angelia Mould, MD;  Location: [facility];  Service: Cardiovascular;  Laterality: N/A;  . AV FISTULA PLACEMENT  03/13/2012   Procedure: ARTERIOVENOUS (AV) FISTULA CREATION;  Surgeon: Conrad [place], MD;  Location: [facility];  Service: Vascular;  Laterality: Right;  . BUNIONECTOMY Bilateral 1980  . CATARACT EXTRACTION, BILATERAL Bilateral    bilateral cataract removal  . ELECTROCARDIOGRAM  05/29/2006  . ESOPHAGOSCOPY W/ BOTOX INJECTION  07/22/2012   Procedure: ESOPHAGOSCOPY WITH BOTOX INJECTION;  Surgeon: Rozetta Nunnery, MD;  Location: [facility];  Service: ENT;  Laterality: N/A;  esophageal dilation  . INSERTION OF DIALYSIS CATHETER N/A 02/05/2013   Procedure: INSERTION OF DIALYSIS CATHETER;  Surgeon: Angelia Mould, MD;  Location: [facility];  Service: Vascular;  Laterality: N/A;  Ultrasound guided  . LAPAROSCOPIC CHOLECYSTECTOMY    .  PERIPHERAL VASCULAR BALLOON ANGIOPLASTY Right 10/22/2016   Procedure: Peripheral Vascular Balloon Angioplasty;  Surgeon: Angelia Mould, MD;  Location: [facility];  Service: Cardiovascular;  Laterality: Right;  AV fistula  . SHOULDER OPEN ROTATOR CUFF REPAIR Left    Dr. Sharol Given  . stress Cardiolite  06/18/2006  . THYROID SURGERY  1997   goiter removal  . TOE AMPUTATION Left 2006  . TOE AMPUTATION Right Aug. 2015   2nd   . tranthoracic echocardiogram  06/18/2006  .  VIDEO BRONCHOSCOPY Bilateral 10/25/2015   Procedure: VIDEO BRONCHOSCOPY WITH FLUORO;  Surgeon: Rigoberto [place], MD;  Location: [facility];  Service: Cardiopulmonary;  Laterality: Bilateral;    Family History  Problem Relation Age of Onset  . Dementia Mother   . Hypertension Mother   . Coronary artery disease Other   . Hyperlipidemia Other   . Hypertension Other   . Ovarian cancer Other   . Stroke Other   . Hypertension Sister   . Hypertension Brother   . Heart attack Brother   . Stroke Brother     Social History:  reports that she has quit smoking. Her smoking use included Cigarettes. She has a 12.75 pack-year smoking history. She has never used smokeless tobacco. She reports that she does not drink alcohol or use drugs.  Allergies:  Allergies  Allergen Reactions  . Cephalosporins Itching and Rash    PATIENT DENIES THIS REACTION - Vanc and fortaz given at the same time in June for several doses at dialysis with systemic rash and itching; received zinacef 7/5 and had worseningsystemicrash/ itching and swelling of eyes - so unclear if allergic to either or both  . Nsaids Other (See Comments)    Renal dysfunction  . Pioglitazone Swelling    PATIENT DENIES THIS REACTON - edema  . Adhesive [Tape] Rash  . Vancomycin Rash    PATIENT DENIES THIS REACTON - See comment under cephalosporin    Medications: I have reviewed the patient's current medications.  Results for orders placed or performed during the hospital encounter of 11/16/16 (from the past 48 hour(s))  CBC with Differential     Status: Abnormal   Collection Time: 11/16/16  3:54 PM  Result Value Ref Range   WBC 14.0 (H) 4.0 - 10.5 K/uL   RBC 4.51 3.87 - 5.11 MIL/uL   Hemoglobin 13.1 12.0 - 15.0 g/dL   HCT 38.9 36.0 - 46.0 %   MCV 86.3 78.0 - 100.0 fL   MCH 29.0 26.0 - 34.0 pg   MCHC 33.7 30.0 - 36.0 g/dL   RDW 18.1 (H) 11.5 - 15.5 %   Platelets 349 150 - 400 K/uL   Neutrophils Relative % 79 %   Neutro Abs 10.9 (H) 1.7  - 7.7 K/uL   Lymphocytes Relative 6 %   Lymphs Abs 0.9 0.7 - 4.0 K/uL   Monocytes Relative 11 %   Monocytes Absolute 1.6 (H) 0.1 - 1.0 K/uL   Eosinophils Relative 4 %   Eosinophils Absolute 0.5 0.0 - 0.7 K/uL   Basophils Relative 0 %   Basophils Absolute 0.1 0.0 - 0.1 K/uL  I-Stat Chem 8, ED     Status: Abnormal   Collection Time: 11/16/16  6:14 PM  Result Value Ref Range   Sodium 130 (L) 135 - 145 mmol/L   Potassium 6.9 (HH) 3.5 - 5.1 mmol/L   Chloride 101 101 - 111 mmol/L   BUN 95 (H) 6 - 20 mg/dL   Creatinine,  Ser 16.20 (H) 0.44 - 1.00 mg/dL   Glucose, Bld 130 (H) 65 - 99 mg/dL   Calcium, Ion 0.76 (LL) 1.15 - 1.40 mmol/L   TCO2 20 0 - 100 mmol/L   Hemoglobin 13.6 12.0 - 15.0 g/dL   HCT 40.0 36.0 - 46.0 %   Comment NOTIFIED PHYSICIAN     Dg Chest 2 View  Result Date: 11/16/2016 CLINICAL DATA:  Initial evaluation for acute trauma, fall. EXAM: CHEST  2 VIEW COMPARISON:  Prior radiograph from 05/29/2016. FINDINGS: Cardiac and mediastinal silhouettes are stable in size and contour, and remain within normal limits. Aortic atherosclerosis. Lungs mildly hypoinflated. Persistent irregular and linear opacity at the right lung apex, similar to previous. Finding suspected to largely reflect post treatment changes. Residual malignancy not excluded. Similarly, superimposed infection not excluded either. No other focal airspace disease. Mild scarring at the left lung base. No pulmonary edema or pleural effusion. No pneumothorax. No acute osseus abnormality.  Scoliosis. IMPRESSION: 1. Persistent right upper lobe airspace disease. Findings suspected to largely reflect post treatment changes. Residual malignancy or superimposed infection not excluded. 2. Left basilar scarring. 3. Aortic atherosclerosis. Electronically Signed   By: Jeannine Boga M.D.   On: 11/16/2016 16:49   Dg Tibia/fibula Left  Result Date: 11/16/2016 CLINICAL DATA:  Initial evaluation for acute trauma, fall. EXAM: LEFT TIBIA  AND FIBULA - 2 VIEW COMPARISON:  None. FINDINGS: Acute comminuted fracture of the mid-distal shaft of the left tibia. Slight lateral no definite fibular fracture. Overlying soft tissue swelling at the mid leg. Displacement of the main butterfly fragment. IMPRESSION: Acute comminuted fracture of the mid-distal left tibia with overlying soft tissue swelling. Electronically Signed   By: Jeannine Boga M.D.   On: 11/16/2016 16:46    ROS Blood pressure (!) 144/67, pulse 95, temperature 98.7 F (37.1 C), temperature source Oral, resp. rate 18, weight 148 lb (67.1 kg), SpO2 97 %. Physical Exam  Constitutional: She is oriented to person, place, and time. She appears well-developed and well-nourished.  HENT:  Head: Normocephalic and atraumatic.  Neck: Normal range of motion. Neck supple.  Cardiovascular: Normal rate and regular rhythm.   Respiratory: Effort normal and breath sounds normal.  GI: Soft.  Musculoskeletal:       Left lower leg: She exhibits bony tenderness, swelling and deformity.  Neurological: She is alert and oriented to person, place, and time.    Her left leg is assessed and shows that her calf is soft. Her foot is well perfused. there is swelling but the compartments are soft and there is no gross malalignment.  Assessment/Plan: Left spiral tibia shaft  I spoke to the patient in length about a recommendation of surgery to stabilize her left tibia fracture. A thorough discussion of the risks and benefits of surgery was had. My recommendation would be an intra-medullary nail/rod. We'll most likely be Saturday and Sunday at the latest. We would prefer Saturday for surgery. She does need medical clearance and dialysis prior to surgery. We will have her nothing by mouth after midnight tonight in anticipation of surgery tomorrow.  Mcarthur Rossetti 11/16/2016, 7:26 PM

## 2016-11-16 NOTE — ED Notes (Signed)
Notified Lovena Le, RN that Chem 8 will not cross over due to invalid specimen.  Advised to recollect I stat chem 8.

## 2016-11-16 NOTE — Progress Notes (Signed)
Orthopedic Tech Progress Note Patient Details:  Nataya Bastedo Takacs 1940/10/26 [id number]  Ortho Devices Type of Ortho Device: Long leg splint Ortho Device/Splint Location: Applied long leg splint to pt left leg.  Pt tolerated fair.  Left leg. Ortho Device/Splint Interventions: Application   Kristopher Oppenheim 11/16/2016, 7:05 PM

## 2016-11-16 NOTE — ED Notes (Signed)
Patient transported to X-ray 

## 2016-11-16 NOTE — ED Notes (Signed)
No blood draw pt transported to inpatient floor.

## 2016-11-16 NOTE — ED Notes (Signed)
Pt on the way to dialysis. Pt to floor after dialysis

## 2016-11-16 NOTE — ED Notes (Signed)
Pt desat to 86% on room air. 3L [place] applied

## 2016-11-16 NOTE — ED Triage Notes (Addendum)
Fell at home injuring left lower leg mid shaft area.  Large hematoma present, ice placed.  24 ga IV placed by EMS with 50 mcg fentanyl given as exiting the ambulance for ED.  Pt is dialysis, has not had it since last Saturday, missing Tuesday and Thursday this week.  Right arm fistula with bruit.

## 2016-11-16 NOTE — Progress Notes (Signed)
Patient off the floor when RT came to give '10mg'$  CAT.

## 2016-11-16 NOTE — ED Provider Notes (Addendum)
[facility] DEPT Provider Note   CSN: [id number] Arrival date & time: 11/16/16  1437     History   Chief Complaint Chief Complaint  Patient presents with  . Fall    HPI [name] [name] is a 76 y.o. female.  HPI 76 year old female who presents with fall just prior to arrival. She has a history of end-stage renal disease on hemodialysis, but states that she has not been to dialysis since last Saturday, missing her last 2 treatments this week. Also has a history of diabetes, peripheral vascular disease, hypertension and hyperlipidemia. States that she was walking to the bathroom today, lost balance, and fell on her left side. She did not hit her head or have loss of consciousness. Had swelling to the left lower leg, was unable to ambulate after fall. Pain worse with movement. She did receive 50 g of fentanyl, with brief relief but return of her pain now. Denies any difficulty breathing, chest pain, abdominal pain, vision or speech changes, focal numbness or weakness, nausea or vomiting, neck pain or back pain. Denies taking any blood thinners.   Past Medical History:  Diagnosis Date  . ANEMIA-NOS 05/29/2007  . ANXIETY 03/23/2010  . Arthritis    "all over my body"  . Cancer of kidney ([place]) 10/07/2012   Followed per Dr Despina Pole, MD, urology, [place]   . Chronic bronchitis ([place])   . Chronic lower back pain   . Chronic neck pain 12/13/2010  . 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 COMMON MIGRAINE    "stress common migraines"  . Complication of anesthesia    after goiter removed-one vocal cord paralyzed  . Critical lower limb ischemia   . DEPRESSION 02/17/2007  . ESRD (end stage renal disease) on dialysis ([place])    "TTS. [address]" (10/20/2015)  . ESRD on hemodialysis ([place]) 02/17/2007   Started dialysis April 2014.  Gets HD at [facility] on TTS schedule.  Cause of ESRD was HTN.     . GERD (gastroesophageal reflux disease)   . GOUT 05/29/2007  .  Heart murmur 10/02/2010   hx  . HYPERLIPIDEMIA 02/17/2007  . HYPERTENSION 02/17/2007  . HYPOTHYROIDISM 02/17/2007   s/p surgical removal of goiter in 1997  . Memory loss 01/24/2010  . OSTEOPENIA 09/22/2009  . Palpitations 09/08/2010  . PEPTIC ULCER DISEASE 05/29/2007   "when I was in college"  . PERIPHERAL NEUROPATHY 05/29/2007  . PERIPHERAL VASCULAR DISEASE 02/17/2007  . Personal history of colonic polyps 11/16/2009  . Renal insufficiency   . RESTLESS LEG SYNDROME 05/29/2007  . SEIZURE DISORDER 02/17/2007  . Type II diabetes mellitus ([place]) 02/17/2007   "haven't had it since ~ 2010" (10/20/2015)  . Vocal cord paralysis 1996    Patient Active Problem List   Diagnosis Date Noted  . Impingement syndrome of left shoulder 10/23/2016  . Chronic pain of left thumb 10/23/2016  . Squamous cell lung cancer ([place]) 10/26/2015  . Lung mass 10/22/2015  . Cough 06/15/2015  . Syncope 05/25/2015    Class: Acute  . Thrombocytopenia ([place]) 08/04/2014  . Acute encephalopathy 08/03/2014  . Non-compliant behavior 05/20/2014  . Pain of right upper arm 04/30/2014  . Other complications due to renal dialysis device, implant, and graft 04/30/2014  . Hyperparathyroidism, secondary renal ([place]) 04/12/2014  . Hyperkalemia 03/30/2014  . Hypoglycemia 03/30/2014  . ESRD on hemodialysis ([place]) 03/30/2014  . Anemia of renal disease 03/30/2014  . Peripheral neuropathy ([place]) 12/12/2013  . Hives  02/09/2013  . Thinning of skin 02/04/2013  . 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 Unspecified constipation 01/02/2013  . Muscle spasm 01/02/2013  . COPD (chronic obstructive pulmonary disease) ([place]) 01/02/2013  . Cancer of kidney ([place]) 10/07/2012  . Insomnia 10/07/2012  . Dysuria 01/24/2012  . N&V (nausea and vomiting) 01/24/2012  . Generalized weakness 08/17/2011  . Gait instability 08/17/2011  . UTI (lower urinary tract infection) 08/17/2011  . Renal failure (ARF), acute on chronic (HCC) 08/17/2011    . Chronic pain of left knee 04/20/2011  . 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 Chronic neck pain 12/13/2010  . Osteopenia 12/13/2010  . Preventative health care 12/12/2010  . HEART MURMUR, HX OF 10/02/2010  . Palpitations 09/08/2010  . HYPERSOMNIA 05/26/2010  . Anxiety state 03/23/2010  . Memory loss 01/24/2010  . Backache 12/12/2009  . Personal history of colonic polyps 11/16/2009  . MENOPAUSAL DISORDER 05/25/2009  . SHOULDER PAIN, LEFT, CHRONIC 09/15/2008  . CERVICAL RADICULOPATHY, LEFT 05/27/2008  . Osteoarth NOS-L/Leg 03/11/2008  . 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 FATIGUE 09/17/2007  . Abnormality of gait 07/30/2007  . Gout 05/29/2007  . RESTLESS LEG SYNDROME 05/29/2007  . COMMON MIGRAINE 05/29/2007  . PERIPHERAL NEUROPATHY 05/29/2007  . Asthmatic bronchitis , chronic ([place]) 05/29/2007  . Peptic ulcer 05/29/2007  . Hypothyroidism 02/17/2007  . Impaired glucose tolerance 02/17/2007  . HLD (hyperlipidemia) 02/17/2007  . DEPRESSION 02/17/2007  . Essential hypertension 02/17/2007  . Peripheral vascular disease ([place]) 02/17/2007  . ALLERGIC RHINITIS 02/17/2007  . GERD 02/17/2007  . Seizure ([place]) 02/17/2007    Past Surgical History:  Procedure Laterality Date  . A/V SHUNTOGRAM N/A 10/22/2016   Procedure: A/V Fistulagram - Right Arm;  Surgeon: Angelia Mould, MD;  Location: [facility];  Service: Cardiovascular;  Laterality: N/A;  . AV FISTULA PLACEMENT  03/13/2012   Procedure: ARTERIOVENOUS (AV) FISTULA CREATION;  Surgeon: Conrad Gillespie, MD;  Location: [facility];  Service: Vascular;  Laterality: Right;  . BUNIONECTOMY Bilateral 1980  . CATARACT EXTRACTION, BILATERAL Bilateral    bilateral cataract removal  . ELECTROCARDIOGRAM  05/29/2006  . ESOPHAGOSCOPY W/ BOTOX INJECTION  07/22/2012   Procedure: ESOPHAGOSCOPY WITH BOTOX INJECTION;  Surgeon: Rozetta Nunnery, MD;  Location: [facility];  Service: ENT;  Laterality: N/A;  esophageal dilation  . INSERTION  OF DIALYSIS CATHETER N/A 02/05/2013   Procedure: INSERTION OF DIALYSIS CATHETER;  Surgeon: Angelia Mould, MD;  Location: [facility];  Service: Vascular;  Laterality: N/A;  Ultrasound guided  . LAPAROSCOPIC CHOLECYSTECTOMY    . PERIPHERAL VASCULAR BALLOON ANGIOPLASTY Right 10/22/2016   Procedure: Peripheral Vascular Balloon Angioplasty;  Surgeon: Angelia Mould, MD;  Location: [facility];  Service: Cardiovascular;  Laterality: Right;  AV fistula  . SHOULDER OPEN ROTATOR CUFF REPAIR Left    Dr. Sharol Given  . stress Cardiolite  06/18/2006  . THYROID SURGERY  1997   goiter removal  . TOE AMPUTATION Left 2006  . TOE AMPUTATION Right Aug. 2015   2nd   . tranthoracic echocardiogram  06/18/2006  . VIDEO BRONCHOSCOPY Bilateral 10/25/2015   Procedure: VIDEO BRONCHOSCOPY WITH FLUORO;  Surgeon: Rigoberto Noel, MD;  Location: [facility];  Service: Cardiopulmonary;  Laterality: Bilateral;    OB History    No data available       Home Medications    Prior to Admission medications   Medication Sig Start Date End Date Taking? Authorizing Provider  albuterol (PROVENTIL HFA;VENTOLIN HFA) 108 (90 BASE)  MCG/ACT inhaler Inhale 2 puffs into the lungs every 6 (six) hours as needed for wheezing or shortness of breath. 06/15/15   Biagio Borg, MD  allopurinol (ZYLOPRIM) 100 MG tablet TAKE ONE TABLET BY MOUTH ONCE DAILY 05/11/16   Biagio Borg, MD  amLODipine (NORVASC) 5 MG tablet Take 1 tablet (5 mg total) by mouth daily. 05/01/16   Biagio Borg, MD  atorvastatin (LIPITOR) 40 MG tablet TAKE ONE TABLET BY MOUTH ONCE DAILY 04/01/15   Biagio Borg, MD  budesonide-formoterol [facility]) 160-4.5 MCG/ACT inhaler Inhale 2 puffs into the lungs 2 (two) times daily. Wait 1 min in between puffs, rinse mouth after use    Historical Provider, MD  cetirizine (ZYRTEC) 10 MG tablet Take 10 mg by mouth daily.    Historical Provider, MD  Cholecalciferol (VITAMIN D-3) 5000 units TABS Take 1 tablet by mouth daily.     Historical Provider, MD  cinacalcet (SENSIPAR) 30 MG tablet Take 30 mg by mouth daily with supper.    Historical Provider, MD  clonazePAM (KLONOPIN) 1 MG tablet Take 1 tablet (1 mg total) by mouth daily. Patient taking differently: Take 1 mg by mouth 2 (two) times daily as needed for anxiety.  01/13/16   Biagio Borg, MD  colchicine 0.6 MG tablet Take 0.5 tablets (0.3 mg total) by mouth 2 (two) times a week. 10/28/15   Dixie Dials, MD  cyclobenzaprine (FLEXERIL) 10 MG tablet TAKE ONE TABLET BY MOUTH THREE TIMES DAILY AS NEEDED FOR MUSCLE SPASM 03/09/16   Biagio Borg, MD  darbepoetin [facility]) 25 MCG/0.42ML SOLN injection Inject 25 mcg into the vein every 7 (seven) days. Every Thursday at dialysis    Historical Provider, MD  dextromethorphan-guaiFENesin [facility] DM) 30-600 MG 12hr tablet Take 1 tablet by mouth daily.    Historical Provider, MD  doxercalciferol (HECTOROL) 4 MCG/2ML injection Inject 1 mL (2 mcg total) into the vein Every Tuesday,Thursday,and Saturday with dialysis. 10/28/15   Dixie Dials, MD  DULoxetine (CYMBALTA) 30 MG capsule Take 2 capsules (60 mg total) by mouth daily. 05/01/16   Biagio Borg, MD  hydrOXYzine (ATARAX/VISTARIL) 10 MG tablet Take 20 mg by mouth daily.     Historical Provider, MD  Lactulose 20 GM/30ML SOLN Take 30 mLs by mouth daily as needed.     Historical Provider, MD  levothyroxine (SYNTHROID, LEVOTHROID) 175 MCG tablet Take 1 tablet (175 mcg total) by mouth daily before breakfast. 03/02/15   Biagio Borg, MD  mometasone-formoterol [facility]) 100-5 MCG/ACT AERO Inhale 2 puffs into the lungs 2 (two) times daily.    Historical Provider, MD  montelukast (SINGULAIR) 10 MG tablet TAKE ONE TABLET BY MOUTH AT BEDTIME. SCHEDULE APPOINTMENT FOR YEARLY PHYSICAL WITH LABS. MUST SEE DOCTOR FOR REFILLS 08/21/16   Biagio Borg, MD  multivitamin (RENA-VIT) TABS tablet Take 1 tablet by mouth daily.    Historical Provider, MD  omeprazole (PRILOSEC) 40 MG capsule Take 1 capsule (40 mg  total) by mouth daily. 01/13/16   Biagio Borg, MD  ondansetron (ZOFRAN) 8 MG tablet Take 1 tablet (8 mg total) by mouth every 6 (six) hours as needed for nausea or vomiting. 01/13/16   Biagio Borg, MD  OVER THE COUNTER MEDICATION Take 1 capsule by mouth 2 (two) times daily. Beet Root 1000 mg per cap    Historical Provider, MD  OVER THE COUNTER MEDICATION 8 drops by Other route daily. Places 8 drops in 1 bottle of water  "Cell Food"  Historical Provider, MD  pregabalin (LYRICA) 50 MG capsule Take 50 mg by mouth at bedtime.     Historical Provider, MD  QUEtiapine (SEROQUEL) 100 MG tablet Take 1 tablet (100 mg total) by mouth at bedtime. 05/01/16   Biagio Borg, MD  sevelamer carbonate (RENVELA) 800 MG tablet Take 2,400-3,200 mg by mouth See admin instructions. 4 tabs with meals, 3 tabs with snacks    Historical Provider, MD  Turmeric Curcumin 500 MG CAPS Take 500 mg by mouth daily.    Historical Provider, MD    Family History Family History  Problem Relation Age of Onset  . Dementia Mother   . Hypertension Mother   . Coronary artery disease Other   . Hyperlipidemia Other   . Hypertension Other   . Ovarian cancer Other   . Stroke Other   . Hypertension Sister   . Hypertension Brother   . Heart attack Brother   . Stroke Brother     Social History Social History  Substance Use Topics  . Smoking status: Former Smoker    Packs/day: 0.25    Years: 51.00    Types: Cigarettes  . Smokeless tobacco: Never Used     Comment: USING E CIG  . Alcohol use No     Allergies   Cephalosporins; Nsaids; Pioglitazone; Adhesive [tape]; and Vancomycin   Review of Systems Review of Systems  Constitutional: Negative for fever.  Eyes: Negative for visual disturbance.  Respiratory: Positive for cough. Negative for shortness of breath.   Cardiovascular: Negative for chest pain.  Gastrointestinal: Negative for abdominal pain.  Musculoskeletal: Negative for back pain and neck pain.       Left leg  injury  Neurological: Negative for syncope.  Hematological: Does not bruise/bleed easily.  Psychiatric/Behavioral: Negative for confusion.  All other systems reviewed and are negative.    Physical Exam Updated Vital Signs BP (!) 154/93   Pulse (!) 101   Temp 98.7 F (37.1 C) (Oral)   Resp 16   Wt 148 lb (67.1 kg)   SpO2 (!) 86%   BMI 22.84 kg/m   Physical Exam Physical Exam  Nursing note and vitals reviewed. Constitutional:  non-toxic, and in no acute distress Head: Normocephalic and atraumatic.  Mouth/Throat: Oropharynx is clear and moist.  Neck: Normal range of motion. Neck supple.  Cardiovascular: Normal rate and regular rhythm.  +2 DP pulses bilaterally Pulmonary/Chest: Effort normal and breath sounds normal. congested cough. Abdominal: Soft. There is no tenderness. There is no rebound and no guarding.  Musculoskeletal:  no edema. Swelling and deformity to left lower leg (mid tib/fib), compartments soft Neurological: Alert, no facial droop, fluent speech, sensation to light touch in tact throughout  Skin: Skin is warm and dry.  Psychiatric: Cooperative   ED Treatments / Results  Labs (all labs ordered are listed, but only abnormal results are displayed) Labs Reviewed  CBC WITH DIFFERENTIAL/PLATELET - Abnormal; Notable for the following:       Result Value   WBC 14.0 (*)    RDW 18.1 (*)    Neutro Abs 10.9 (*)    Monocytes Absolute 1.6 (*)    All other components within normal limits  I-STAT CHEM 8, ED  I-STAT CHEM 8, ED    EKG  EKG Interpretation  Date/Time:  Friday November 16 2016 15:47:26 EDT Ventricular Rate:  102 PR Interval:    QRS Duration: 104 QT Interval:  369 QTC Calculation: 481 R Axis:   64 Text Interpretation:  Sinus  tachycardia Low voltage, extremity leads Nonspecific T abnormalities, lateral leads Baseline wander in lead(s) I III aVL aVF other than tachycardia, similar to prior EKG, t-waves slightly more peaked  Confirmed by LIU MD, DANA  [phone]) on 11/16/2016 4:20:39 PM       Radiology Dg Chest 2 View  Result Date: 11/16/2016 CLINICAL DATA:  Initial evaluation for acute trauma, fall. EXAM: CHEST  2 VIEW COMPARISON:  Prior radiograph from 05/29/2016. FINDINGS: Cardiac and mediastinal silhouettes are stable in size and contour, and remain within normal limits. Aortic atherosclerosis. Lungs mildly hypoinflated. Persistent irregular and linear opacity at the right lung apex, similar to previous. Finding suspected to largely reflect post treatment changes. Residual malignancy not excluded. Similarly, superimposed infection not excluded either. No other focal airspace disease. Mild scarring at the left lung base. No pulmonary edema or pleural effusion. No pneumothorax. No acute osseus abnormality.  Scoliosis. IMPRESSION: 1. Persistent right upper lobe airspace disease. Findings suspected to largely reflect post treatment changes. Residual malignancy or superimposed infection not excluded. 2. Left basilar scarring. 3. Aortic atherosclerosis. Electronically Signed   By: Jeannine Boga M.D.   On: 11/16/2016 16:49   Dg Tibia/fibula Left  Result Date: 11/16/2016 CLINICAL DATA:  Initial evaluation for acute trauma, fall. EXAM: LEFT TIBIA AND FIBULA - 2 VIEW COMPARISON:  None. FINDINGS: Acute comminuted fracture of the mid-distal shaft of the left tibia. Slight lateral no definite fibular fracture. Overlying soft tissue swelling at the mid leg. Displacement of the main butterfly fragment. IMPRESSION: Acute comminuted fracture of the mid-distal left tibia with overlying soft tissue swelling. Electronically Signed   By: Jeannine Boga M.D.   On: 11/16/2016 16:46    Procedures Procedures (including critical care time) CRITICAL CARE Performed by: Forde Dandy   Total critical care time: 35 minutes  Critical care time was exclusive of separately billable procedures and treating other patients.  Critical care was necessary to treat  or prevent imminent or life-threatening deterioration.  Critical care was time spent personally by me on the following activities: development of treatment plan with patient and/or surrogate as well as nursing, discussions with consultants, evaluation of patient's response to treatment, examination of patient, obtaining history from patient or surrogate, ordering and performing treatments and interventions, ordering and review of laboratory studies, ordering and review of radiographic studies, pulse oximetry and re-evaluation of patient's condition.  SPLINT APPLICATION Date/Time: 3:81 PM Authorized by: Forde Dandy Consent: Verbal consent obtained. Risks and benefits: risks, benefits and alternatives were discussed Consent given by: patient Splint applied by: orthopedic technician Location details: left lower extremity Splint type: long leg splint Supplies used: orthoglass Post-procedure: The splinted body part was neurovascularly unchanged following the procedure. Patient tolerance: Patient tolerated the procedure well with no immediate complications.     Medications Ordered in ED Medications  fentaNYL (SUBLIMAZE) injection 50 mcg (50 mcg Intravenous Given 11/16/16 1535)  morphine 4 MG/ML injection 4 mg (4 mg Intravenous Given 11/16/16 1748)     Initial Impression / Assessment and Plan / ED Course  I have reviewed the triage vital signs and the nursing notes.  Pertinent labs & imaging results that were available during my care of the patient were reviewed by me and considered in my medical decision making (see chart for details).       I reviewed the available records in [facility] and also Care Everywhere  With closed left mid to distal tibial shaft fracture. NV in tact. Compartments currently soft. Discussed with Dr.  Ninfa Linden. Will place posterior long leg splint. Admit to medicine for medical clearance for surgery.   Nephrology also evaluated patient for dialysis. Patient with  potassium of 6.9 and will need emergent dialysis today.   With chronic cough and history of NSCLC s/p CTX in remission. CXR shows persistent infiltrate in RUL. On CT chest at [facility] in 09/2016 this was result of post-radiation changes and no recurrent mass or infection. Dr. Mindi Junker is her oncologist at [facility].   Plan to admit to hospitalist service for medical clearance and management.  Final Clinical Impressions(s) / ED Diagnoses   Final diagnoses:  Fall, initial encounter  Closed displaced comminuted fracture of shaft of left tibia, initial encounter  ESRD on dialysis [facility])    New Prescriptions New Prescriptions   No medications on file     Forde Dandy, MD 11/16/16 [place] Liu, MD 11/16/16 [place] Liu, MD 11/16/16 [place] Liu, MD 11/16/16 2008

## 2016-11-16 NOTE — ED Notes (Signed)
I stat chem 8 reported to Dr. Oleta Mouse by B. Yolanda Bonine, EMT

## 2016-11-16 NOTE — ED Notes (Signed)
Ortho paged. 

## 2016-11-16 NOTE — H&P (Signed)
History and Physical    [name] [name] JJH:[id number] DOB: 1941/06/11 DOA: 11/16/2016  PCP: Cathlean Cower, MD   Patient coming from: Home  Chief Complaint: Fall with severe left leg pain   HPI: [name] [name] is a 76 y.o. 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 She describes walking in her home when she tripped, falling onto the left side, experiencing immediate and severe pain in the left lower leg, and was then unable to bear weight. She called EMS. Patient reports that she had missed her dialysis session today, and also 2 days ago due to the passing of her friend. She reports having some diarrhea yesterday and reports feeling poorly related to her friends passing, but denies any recent fevers or chills, chest pain or palpitations, or dyspnea. She reports a chronic cough which has been stable and nonproductive. Patient was treated with 50 g of fentanyl en route to the hospital.  ED Course: Upon arrival to the ED, patient is found to be afebrile, saturating 90 on room air, with blood pressure of 160/80, and with vitals otherwise stable. Chest x-ray demonstrates persistent right upper lobe airspace disease consistent with posttreatment changes. Radiographs of the left leg demonstrated acute comminuted fracture of the mid to distal left tibia with overlying soft tissue swelling. Chemistry panel features a sodium of 1:30, potassium 6.9, and BUN of 95. CBC is notable for a leukocytosis to 14,000. EKG features a sinus tachycardia with rate 102 and peaked T waves. Patient was treated with fentanyl 50 g and morphine 4 mg in the ED. Orthopedic surgery was consulted by the ED physician and advised for a medical  admission. Nephrology was also consulted by the ED provider and will arrange dialysis. Patient remained hypertensive in the ED, but stable with no apparent respiratory distress and will be admitted to the telemetry unit for ongoing evaluation and management of hyperkalemia in the setting of ESRD with missed dialysis, and acute comminuted fracture of the left tibia.  Review of Systems:  All other systems reviewed and apart from HPI, are negative.  Past Medical History:  Diagnosis Date  . ANEMIA-NOS 05/29/2007  . ANXIETY 03/23/2010  . Arthritis    "all over my body"  . Cancer of kidney ([place]) 10/07/2012   Followed per Dr Despina Pole, MD, urology, [place]   . Chronic bronchitis ([place])   . Chronic lower back pain   . Chronic neck pain 12/13/2010  . 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 COMMON MIGRAINE    "stress common migraines"  . Complication of anesthesia    after goiter removed-one vocal cord paralyzed  . Critical lower limb ischemia   . DEPRESSION 02/17/2007  . ESRD (end stage renal disease) on dialysis ([place])    "TTS. [address]" (10/20/2015)  . ESRD on hemodialysis ([place]) 02/17/2007   Started dialysis April 2014.  Gets HD at [facility] on TTS schedule.  Cause of ESRD was HTN.     . GERD (gastroesophageal reflux disease)   . GOUT 05/29/2007  . Heart murmur 10/02/2010   hx  . HYPERLIPIDEMIA 02/17/2007  . HYPERTENSION 02/17/2007  . HYPOTHYROIDISM 02/17/2007   s/p surgical removal of goiter in 1997  . Memory loss 01/24/2010  .  OSTEOPENIA 09/22/2009  . Palpitations 09/08/2010  . PEPTIC ULCER DISEASE 05/29/2007   "when I was in college"  . PERIPHERAL NEUROPATHY 05/29/2007  . PERIPHERAL VASCULAR DISEASE 02/17/2007  . Personal history of colonic polyps 11/16/2009  . Renal insufficiency   . RESTLESS LEG SYNDROME 05/29/2007  . SEIZURE DISORDER 02/17/2007  . Type II diabetes mellitus ([place]) 02/17/2007   "haven't had it since ~ 2010" (10/20/2015)  . Vocal cord  paralysis 1996    Past Surgical History:  Procedure Laterality Date  . A/V SHUNTOGRAM N/A 10/22/2016   Procedure: A/V Fistulagram - Right Arm;  Surgeon: Angelia Mould, MD;  Location: [place] [facility];  Service: Cardiovascular;  Laterality: N/A;  . AV FISTULA PLACEMENT  03/13/2012   Procedure: ARTERIOVENOUS (AV) FISTULA CREATION;  Surgeon: Conrad Keeseville, MD;  Location: [place];  Service: Vascular;  Laterality: Right;  . BUNIONECTOMY Bilateral 1980  . CATARACT EXTRACTION, BILATERAL Bilateral    bilateral cataract removal  . ELECTROCARDIOGRAM  05/29/2006  . ESOPHAGOSCOPY W/ BOTOX INJECTION  07/22/2012   Procedure: ESOPHAGOSCOPY WITH BOTOX INJECTION;  Surgeon: Rozetta Nunnery, MD;  Location: [place];  Service: ENT;  Laterality: N/A;  esophageal dilation  . INSERTION OF DIALYSIS CATHETER N/A 02/05/2013   Procedure: INSERTION OF DIALYSIS CATHETER;  Surgeon: Angelia Mould, MD;  Location: [place];  Service: Vascular;  Laterality: N/A;  Ultrasound guided  . LAPAROSCOPIC CHOLECYSTECTOMY    . PERIPHERAL VASCULAR BALLOON ANGIOPLASTY Right 10/22/2016   Procedure: Peripheral Vascular Balloon Angioplasty;  Surgeon: Angelia Mould, MD;  Location: [place] [facility];  Service: Cardiovascular;  Laterality: Right;  AV fistula  . SHOULDER OPEN ROTATOR CUFF REPAIR Left    Dr. Sharol Given  . stress Cardiolite  06/18/2006  . THYROID SURGERY  1997   goiter removal  . TOE AMPUTATION Left 2006  . TOE AMPUTATION Right Aug. 2015   2nd   . tranthoracic echocardiogram  06/18/2006  . VIDEO BRONCHOSCOPY Bilateral 10/25/2015   Procedure: VIDEO BRONCHOSCOPY WITH FLUORO;  Surgeon: Rigoberto Noel, MD;  Location: [place];  Service: Cardiopulmonary;  Laterality: Bilateral;     reports that she has quit smoking. Her smoking use included Cigarettes. She has a 12.75 pack-year smoking history. She has never used smokeless tobacco. She reports that she does not drink alcohol or use  drugs.  Allergies  Allergen Reactions  . Cephalosporins Itching and Rash    PATIENT DENIES THIS REACTION - Vanc and fortaz given at the same time in June for several doses at dialysis with systemic rash and itching; received zinacef 7/5 and had worseningsystemicrash/ itching and swelling of eyes - so unclear if allergic to either or both  . Nsaids Other (See Comments)    Renal dysfunction  . Pioglitazone Swelling    PATIENT DENIES THIS REACTON - edema  . Adhesive [Tape] Rash  . Vancomycin Rash    PATIENT DENIES THIS REACTON - See comment under cephalosporin    Family History  Problem Relation Age of Onset  . Dementia Mother   . Hypertension Mother   . Coronary artery disease Other   . Hyperlipidemia Other   . Hypertension Other   . Ovarian cancer Other   . Stroke Other   . Hypertension Sister   . Hypertension Brother   . Heart attack Brother   . Stroke Brother      Prior to Admission medications   Medication Sig Start Date End Date Taking? Authorizing Provider  albuterol (  PROVENTIL HFA;VENTOLIN HFA) 108 (90 BASE) MCG/ACT inhaler Inhale 2 puffs into the lungs every 6 (six) hours as needed for wheezing or shortness of breath. 06/15/15   Biagio Borg, MD  allopurinol (ZYLOPRIM) 100 MG tablet TAKE ONE TABLET BY MOUTH ONCE DAILY 05/11/16   Biagio Borg, MD  amLODipine (NORVASC) 5 MG tablet Take 1 tablet (5 mg total) by mouth daily. 05/01/16   Biagio Borg, MD  atorvastatin (LIPITOR) 40 MG tablet TAKE ONE TABLET BY MOUTH ONCE DAILY 04/01/15   Biagio Borg, MD  budesonide-formoterol [facility]) 160-4.5 MCG/ACT inhaler Inhale 2 puffs into the lungs 2 (two) times daily. Wait 1 min in between puffs, rinse mouth after use    Historical Provider, MD  cetirizine (ZYRTEC) 10 MG tablet Take 10 mg by mouth daily.    Historical Provider, MD  Cholecalciferol (VITAMIN D-3) 5000 units TABS Take 1 tablet by mouth daily.    Historical Provider, MD  cinacalcet (SENSIPAR) 30 MG tablet Take 30 mg by mouth  daily with supper.    Historical Provider, MD  clonazePAM (KLONOPIN) 1 MG tablet Take 1 tablet (1 mg total) by mouth daily. Patient taking differently: Take 1 mg by mouth 2 (two) times daily as needed for anxiety.  01/13/16   Biagio Borg, MD  colchicine 0.6 MG tablet Take 0.5 tablets (0.3 mg total) by mouth 2 (two) times a week. 10/28/15   Dixie Dials, MD  cyclobenzaprine (FLEXERIL) 10 MG tablet TAKE ONE TABLET BY MOUTH THREE TIMES DAILY AS NEEDED FOR MUSCLE SPASM 03/09/16   Biagio Borg, MD  darbepoetin [facility]) 25 MCG/0.42ML SOLN injection Inject 25 mcg into the vein every 7 (seven) days. Every Thursday at dialysis    Historical Provider, MD  dextromethorphan-guaiFENesin [facility] DM) 30-600 MG 12hr tablet Take 1 tablet by mouth daily.    Historical Provider, MD  doxercalciferol (HECTOROL) 4 MCG/2ML injection Inject 1 mL (2 mcg total) into the vein Every Tuesday,Thursday,and Saturday with dialysis. 10/28/15   Dixie Dials, MD  DULoxetine (CYMBALTA) 30 MG capsule Take 2 capsules (60 mg total) by mouth daily. 05/01/16   Biagio Borg, MD  hydrOXYzine (ATARAX/VISTARIL) 10 MG tablet Take 20 mg by mouth daily.     Historical Provider, MD  Lactulose 20 GM/30ML SOLN Take 30 mLs by mouth daily as needed.     Historical Provider, MD  levothyroxine (SYNTHROID, LEVOTHROID) 175 MCG tablet Take 1 tablet (175 mcg total) by mouth daily before breakfast. 03/02/15   Biagio Borg, MD  mometasone-formoterol [facility]) 100-5 MCG/ACT AERO Inhale 2 puffs into the lungs 2 (two) times daily.    Historical Provider, MD  montelukast (SINGULAIR) 10 MG tablet TAKE ONE TABLET BY MOUTH AT BEDTIME. SCHEDULE APPOINTMENT FOR YEARLY PHYSICAL WITH LABS. MUST SEE DOCTOR FOR REFILLS 08/21/16   Biagio Borg, MD  multivitamin (RENA-VIT) TABS tablet Take 1 tablet by mouth daily.    Historical Provider, MD  omeprazole (PRILOSEC) 40 MG capsule Take 1 capsule (40 mg total) by mouth daily. 01/13/16   Biagio Borg, MD  ondansetron (ZOFRAN) 8 MG tablet  Take 1 tablet (8 mg total) by mouth every 6 (six) hours as needed for nausea or vomiting. 01/13/16   Biagio Borg, MD  OVER THE COUNTER MEDICATION Take 1 capsule by mouth 2 (two) times daily. Beet Root 1000 mg per cap    Historical Provider, MD  OVER THE COUNTER MEDICATION 8 drops by Other route daily. Places 8 drops in 1  bottle of water  "Cell Food"    Historical Provider, MD  pregabalin (LYRICA) 50 MG capsule Take 50 mg by mouth at bedtime.     Historical Provider, MD  QUEtiapine (SEROQUEL) 100 MG tablet Take 1 tablet (100 mg total) by mouth at bedtime. 05/01/16   Biagio Borg, MD  sevelamer carbonate (RENVELA) 800 MG tablet Take 2,400-3,200 mg by mouth See admin instructions. 4 tabs with meals, 3 tabs with snacks    Historical Provider, MD  Turmeric Curcumin 500 MG CAPS Take 500 mg by mouth daily.    Historical Provider, MD    Physical Exam: Vitals:   11/16/16 1845 11/16/16 1915 11/16/16 1930 11/16/16 1945  BP: (!) 155/68 (!) 152/64 (!) 155/64 (!) 158/69  Pulse: 95   (!) 101  Resp: (!) 21 (!) 21 (!) 22 (!) 30  Temp:      TempSrc:      SpO2: 100%   99%  Weight:          Constitutional: No respiratory distress. In apparent discomfort. Appears chronically-ill.  Eyes: PERTLA, lids and conjunctivae normal ENMT: Mucous membranes are moist. Posterior pharynx clear of any exudate or lesions.   Neck: normal, supple, no masses, no thyromegaly Respiratory: clear to auscultation bilaterally, no wheezing, no crackles. Normal respiratory effort.   Cardiovascular: S1 & S2 heard, regular rate and rhythm. No extremity edema. No significant JVD. Abdomen: No distension, no tenderness, no masses palpated. Bowel sounds normal.  Musculoskeletal: no clubbing / cyanosis. Left leg in long splint; neurovascularly intact distally.  Skin: no significant rashes, lesions, ulcers. Warm, dry, well-perfused. Neurologic: CN 2-12 grossly intact. Sensation intact, DTR normal. Strength 5/5 in all 4 limbs.  Psychiatric:  Alert and oriented x 3. Calm and cooperative.     Labs on Admission: I have personally reviewed following labs and imaging studies  CBC:  Recent Labs Lab 11/16/16 1554 11/16/16 1814  WBC 14.0*  --   NEUTROABS 10.9*  --   HGB 13.1 13.6  HCT 38.9 40.0  MCV 86.3  --   PLT 349  --    Basic Metabolic Panel:  Recent Labs Lab 11/16/16 1814  NA 130*  K 6.9*  CL 101  GLUCOSE 130*  BUN 95*  CREATININE 16.20*   GFR: Estimated Creatinine Clearance: 2.9 mL/min (A) (by C-G formula based on SCr of 16.2 mg/dL (H)). Liver Function Tests: No results for input(s): AST, ALT, ALKPHOS, BILITOT, PROT, ALBUMIN in the last 168 hours. No results for input(s): LIPASE, AMYLASE in the last 168 hours. No results for input(s): AMMONIA in the last 168 hours. Coagulation Profile: No results for input(s): INR, PROTIME in the last 168 hours. Cardiac Enzymes: No results for input(s): CKTOTAL, CKMB, CKMBINDEX, TROPONINI in the last 168 hours. BNP (last 3 results) No results for input(s): PROBNP in the last 8760 hours. HbA1C: No results for input(s): HGBA1C in the last 72 hours. CBG: No results for input(s): GLUCAP in the last 168 hours. Lipid Profile: No results for input(s): CHOL, HDL, LDLCALC, TRIG, CHOLHDL, LDLDIRECT in the last 72 hours. Thyroid Function Tests: No results for input(s): TSH, T4TOTAL, FREET4, T3FREE, THYROIDAB in the last 72 hours. Anemia Panel: No results for input(s): VITAMINB12, FOLATE, FERRITIN, TIBC, IRON, RETICCTPCT in the last 72 hours. Urine analysis:    Component Value Date/Time   COLORURINE BROWN (A) 08/04/2014 0254   APPEARANCEUR TURBID (A) 08/04/2014 0254   LABSPEC 1.017 08/04/2014 0254   PHURINE 6.0 08/04/2014 0254   GLUCOSEU NEGATIVE  08/04/2014 0254   GLUCOSEU NEGATIVE 10/07/2012 1421   HGBUR LARGE (A) 08/04/2014 0254   BILIRUBINUR SMALL (A) 08/04/2014 0254   KETONESUR 15 (A) 08/04/2014 0254   PROTEINUR >300 (A) 08/04/2014 0254   UROBILINOGEN 0.2  08/04/2014 0254   NITRITE NEGATIVE 08/04/2014 0254   LEUKOCYTESUR LARGE (A) 08/04/2014 0254   Sepsis Labs: '@LABRCNTIP'$ (procalcitonin:4,lacticidven:4) )No results found for this or any previous visit (from the past 240 hour(s)).   Radiological Exams on Admission: Dg Chest 2 View  Result Date: 11/16/2016 CLINICAL DATA:  Initial evaluation for acute trauma, fall. EXAM: CHEST  2 VIEW COMPARISON:  Prior radiograph from 05/29/2016. FINDINGS: Cardiac and mediastinal silhouettes are stable in size and contour, and remain within normal limits. Aortic atherosclerosis. Lungs mildly hypoinflated. Persistent irregular and linear opacity at the right lung apex, similar to previous. Finding suspected to largely reflect post treatment changes. Residual malignancy not excluded. Similarly, superimposed infection not excluded either. No other focal airspace disease. Mild scarring at the left lung base. No pulmonary edema or pleural effusion. No pneumothorax. No acute osseus abnormality.  Scoliosis. IMPRESSION: 1. Persistent right upper lobe airspace disease. Findings suspected to largely reflect post treatment changes. Residual malignancy or superimposed infection not excluded. 2. Left basilar scarring. 3. Aortic atherosclerosis. Electronically Signed   By: Jeannine Boga M.D.   On: 11/16/2016 16:49   Dg Tibia/fibula Left  Result Date: 11/16/2016 CLINICAL DATA:  Initial evaluation for acute trauma, fall. EXAM: LEFT TIBIA AND FIBULA - 2 VIEW COMPARISON:  None. FINDINGS: Acute comminuted fracture of the mid-distal shaft of the left tibia. Slight lateral no definite fibular fracture. Overlying soft tissue swelling at the mid leg. Displacement of the main butterfly fragment. IMPRESSION: Acute comminuted fracture of the mid-distal left tibia with overlying soft tissue swelling. Electronically Signed   By: Jeannine Boga M.D.   On: 11/16/2016 16:46    EKG: Independently reviewed. Sinus tachycardia (reate 102),  low-voltage QRS, peaked T-waves  Assessment/Plan  1. Hyperkalemia - Serum potassium is 6.9 on admission with peaked T-waves noted on EKG  - Secondary to ESRD with missed dialysis sessions  - Nephrology is consulting and much appreciated; urgent HD is being arranged  - She was treated with temporizing measures in ED, will be monitored on telemetry, and chem panel will be repeated overnight if HD is delayed   2. Left tibia fracture  - Pt fell onto left side at home just PTA, experience immediate severe pain at lower left leg and couldn't bear weight  - Radiographs demonstrate comminuted fracture of mid-to-distal left tibia   - Orthopedic surgery is consulting and much appreciated  - Pending normalization of potassium with HD, and based on the available data, Ms. Raus presents a relatively high risk for perioperative MI or cardiac arrest, approximately 2.5% per Melburn Hake al  - She has been immobilized in splint and noted to be neurovascularly intact distal to injury  - Continue prn analgesia, obtain type and screen, check INR   3. ESRD  - Pt usually dialyzes TTS, but missed 2 sessions leading up to admission  - Nephrology has arranged urgent HD as above in the setting of hyperkalemia with EKG changes  - Continue binders, SLIV    4. COPD   - No wheezing or respiratory complaints on admission  - Continue scheduled Dulera and prn albuterol    5. Hypertension  - BP modestly elevated in ED in setting of pain  - Managed at home with Norvasc, will continue  -  If pressures remain elevated despite HD and pain-control, prn hydralazine IVP's available    6. Lung cancer, squamous cell - RUL SCC, followed by oncologist at [facility]  - She completed chemoradiation and is now undergoing imaging surveillance q18mo  - Per onc notes from Feb, there is no evidence for recurrence and her chronic dry cough is likely secondary to treatment and her new baseline   7. GERD - EGD from 2016 with pangastritis and  mild duodenitis, small hiatal hernia, no esophagitis  - Managed with daily PPI at home, will continue   8. Hypothyroidism  - Appears stable, continue Synthroid    DVT prophylaxis: sq heparin Code Status: Full  Family Communication: Discussed with family Disposition Plan: Admit to telemetry Consults called: Nephrology, orthopedic surgery Admission status: Inpatient    TVianne Bulls MD [facility] Pager [phone] If 7PM-7AM, please contact night-coverage [email or web address] Password [facility] 11/16/2016, 7:58 PM

## 2016-11-16 NOTE — Consult Note (Signed)
[facility] Renal Consultation Note    Indication for Consultation:  Management of ESRD/hemodialysis; anemia, hypertension/volume and secondary hyperparathyroidism PCP:  HPI: [name] [name] is a 76 y.o. female with ESRD on hemodialysis T, Th,S at [facility]. PMH of DM, HTN, PVD, GERD, GOUT, Anemia of chronic disease, SPTH, Squamous cell cancer of R lung, dyslipidemia. Patient's last HD treatment was 15-Nov-2016. Says a friend died and she was too sick to go to dialysis.   Patient was in usual state of health when she tripped today sustaining mechanical fall on Left side of her body. She denies striking her head or sustaining injury to other areas of body, denies injury to L shoulder or L hip. Pt says that her LLE "hurt so much I couldn't walk". Was transported via EMS to ED for arrival, given Fentanyl in ambulance for pain. She currently denies chest pain/SOB, fever, chills, N,V,D, abdominal pain, bloody/tarry stools, constipation, dysuria, hematuria, headache, dizziness, vertigo, vision or hearing changes. Only C/O at present is pain 10/10 LLE.which is swollen in mid tibia/fibia area. Leg immobilizer in place. Niece at bedside. ED physician is seeing patient, orthopedics to be called.    Past Medical History:  Diagnosis Date  . ANEMIA-NOS 05/29/2007  . ANXIETY 03/23/2010  . Arthritis    "all over my body"  . Cancer of kidney ([place]) 10/07/2012   Followed per Dr Despina Pole, MD, urology, [place]   . Chronic bronchitis ([place])   . Chronic lower back pain   . Chronic neck pain 12/13/2010  . 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 COMMON MIGRAINE    "stress common migraines"  . Complication of anesthesia    after goiter removed-one vocal cord paralyzed  . Critical lower limb ischemia   . DEPRESSION 02/17/2007  . ESRD (end stage renal disease) on dialysis ([place])    "TTS. [address]" (10/20/2015)  . ESRD on hemodialysis ([place])  02/17/2007   Started dialysis April 2014.  Gets HD at [facility] on TTS schedule.  Cause of ESRD was HTN.     . GERD (gastroesophageal reflux disease)   . GOUT 05/29/2007  . Heart murmur 10/02/2010   hx  . HYPERLIPIDEMIA 02/17/2007  . HYPERTENSION 02/17/2007  . HYPOTHYROIDISM 02/17/2007   s/p surgical removal of goiter in 1997  . Memory loss 01/24/2010  . OSTEOPENIA 09/22/2009  . Palpitations 09/08/2010  . PEPTIC ULCER DISEASE 05/29/2007   "when I was in college"  . PERIPHERAL NEUROPATHY 05/29/2007  . PERIPHERAL VASCULAR DISEASE 02/17/2007  . Personal history of colonic polyps 11/16/2009  . Renal insufficiency   . RESTLESS LEG SYNDROME 05/29/2007  . SEIZURE DISORDER 02/17/2007  . Type II diabetes mellitus ([place]) 02/17/2007   "haven't had it since ~ 2010" (10/20/2015)  . Vocal cord paralysis 1996   Past Surgical History:  Procedure Laterality Date  . A/V SHUNTOGRAM N/A 10/22/2016   Procedure: A/V Fistulagram - Right Arm;  Surgeon: Angelia Mould, MD;  Location: [place] CV [facility];  Service: Cardiovascular;  Laterality: N/A;  . AV FISTULA PLACEMENT  03/13/2012   Procedure: ARTERIOVENOUS (AV) FISTULA CREATION;  Surgeon: Conrad Ryegate, MD;  Location: [place];  Service: Vascular;  Laterality: Right;  . BUNIONECTOMY Bilateral 1980  . CATARACT EXTRACTION, BILATERAL Bilateral    bilateral cataract removal  . ELECTROCARDIOGRAM  05/29/2006  . ESOPHAGOSCOPY W/ BOTOX INJECTION  07/22/2012   Procedure: ESOPHAGOSCOPY WITH BOTOX INJECTION;  Surgeon: Rozetta Nunnery, MD;  Location: [place];  Service: ENT;  Laterality: N/A;  esophageal dilation  . INSERTION OF DIALYSIS CATHETER N/A 02/05/2013   Procedure: INSERTION OF DIALYSIS CATHETER;  Surgeon: Angelia Mould, MD;  Location: [place];  Service: Vascular;  Laterality: N/A;  Ultrasound guided  . LAPAROSCOPIC CHOLECYSTECTOMY    . PERIPHERAL VASCULAR BALLOON ANGIOPLASTY Right 10/22/2016   Procedure: Peripheral Vascular Balloon  Angioplasty;  Surgeon: Angelia Mould, MD;  Location: [place] CV [facility];  Service: Cardiovascular;  Laterality: Right;  AV fistula  . SHOULDER OPEN ROTATOR CUFF REPAIR Left    Dr. Sharol Given  . stress Cardiolite  06/18/2006  . THYROID SURGERY  1997   goiter removal  . TOE AMPUTATION Left 2006  . TOE AMPUTATION Right Aug. 2015   2nd   . tranthoracic echocardiogram  06/18/2006  . VIDEO BRONCHOSCOPY Bilateral 10/25/2015   Procedure: VIDEO BRONCHOSCOPY WITH FLUORO;  Surgeon: Rigoberto Noel, MD;  Location: [place];  Service: Cardiopulmonary;  Laterality: Bilateral;   Family History  Problem Relation Age of Onset  . Dementia Mother   . Hypertension Mother   . Coronary artery disease Other   . Hyperlipidemia Other   . Hypertension Other   . Ovarian cancer Other   . Stroke Other   . Hypertension Sister   . Hypertension Brother   . Heart attack Brother   . Stroke Brother    Social History:  reports that she has quit smoking. Her smoking use included Cigarettes. She has a 12.75 pack-year smoking history. She has never used smokeless tobacco. She reports that she does not drink alcohol or use drugs. Allergies  Allergen Reactions  . Cephalosporins Itching and Rash    PATIENT DENIES THIS REACTION - Vanc and fortaz given at the same time in June for several doses at dialysis with systemic rash and itching; received zinacef 7/5 and had worseningsystemicrash/ itching and swelling of eyes - so unclear if allergic to either or both  . Nsaids Other (See Comments)    Renal dysfunction  . Pioglitazone Swelling    PATIENT DENIES THIS REACTON - edema  . Adhesive [Tape] Rash  . Vancomycin Rash    PATIENT DENIES THIS REACTON - See comment under cephalosporin   Prior to Admission medications   Medication Sig Start Date End Date Taking? Authorizing Provider  albuterol (PROVENTIL HFA;VENTOLIN HFA) 108 (90 BASE) MCG/ACT inhaler Inhale 2 puffs into the lungs every 6 (six) hours as needed for  wheezing or shortness of breath. 06/15/15   Biagio Borg, MD  allopurinol (ZYLOPRIM) 100 MG tablet TAKE ONE TABLET BY MOUTH ONCE DAILY 05/11/16   Biagio Borg, MD  amLODipine (NORVASC) 5 MG tablet Take 1 tablet (5 mg total) by mouth daily. 05/01/16   Biagio Borg, MD  atorvastatin (LIPITOR) 40 MG tablet TAKE ONE TABLET BY MOUTH ONCE DAILY 04/01/15   Biagio Borg, MD  budesonide-formoterol [facility]) 160-4.5 MCG/ACT inhaler Inhale 2 puffs into the lungs 2 (two) times daily. Wait 1 min in between puffs, rinse mouth after use    Historical Provider, MD  cetirizine (ZYRTEC) 10 MG tablet Take 10 mg by mouth daily.    Historical Provider, MD  Cholecalciferol (VITAMIN D-3) 5000 units TABS Take 1 tablet by mouth daily.    Historical Provider, MD  cinacalcet (SENSIPAR) 30 MG tablet Take 30 mg by mouth daily with supper.    Historical Provider, MD  clonazePAM (KLONOPIN) 1 MG tablet Take 1 tablet (1 mg total)  by mouth daily. Patient taking differently: Take 1 mg by mouth 2 (two) times daily as needed for anxiety.  01/13/16   Biagio Borg, MD  colchicine 0.6 MG tablet Take 0.5 tablets (0.3 mg total) by mouth 2 (two) times a week. 10/28/15   Dixie Dials, MD  cyclobenzaprine (FLEXERIL) 10 MG tablet TAKE ONE TABLET BY MOUTH THREE TIMES DAILY AS NEEDED FOR MUSCLE SPASM 03/09/16   Biagio Borg, MD  darbepoetin [facility]) 25 MCG/0.42ML SOLN injection Inject 25 mcg into the vein every 7 (seven) days. Every Thursday at dialysis    Historical Provider, MD  dextromethorphan-guaiFENesin [facility] DM) 30-600 MG 12hr tablet Take 1 tablet by mouth daily.    Historical Provider, MD  doxercalciferol (HECTOROL) 4 MCG/2ML injection Inject 1 mL (2 mcg total) into the vein Every Tuesday,Thursday,and Saturday with dialysis. 10/28/15   Dixie Dials, MD  DULoxetine (CYMBALTA) 30 MG capsule Take 2 capsules (60 mg total) by mouth daily. 05/01/16   Biagio Borg, MD  hydrOXYzine (ATARAX/VISTARIL) 10 MG tablet Take 20 mg by mouth daily.      Historical Provider, MD  Lactulose 20 GM/30ML SOLN Take 30 mLs by mouth daily as needed.     Historical Provider, MD  levothyroxine (SYNTHROID, LEVOTHROID) 175 MCG tablet Take 1 tablet (175 mcg total) by mouth daily before breakfast. 03/02/15   Biagio Borg, MD  mometasone-formoterol [facility]) 100-5 MCG/ACT AERO Inhale 2 puffs into the lungs 2 (two) times daily.    Historical Provider, MD  montelukast (SINGULAIR) 10 MG tablet TAKE ONE TABLET BY MOUTH AT BEDTIME. SCHEDULE APPOINTMENT FOR YEARLY PHYSICAL WITH LABS. MUST SEE DOCTOR FOR REFILLS 08/21/16   Biagio Borg, MD  multivitamin (RENA-VIT) TABS tablet Take 1 tablet by mouth daily.    Historical Provider, MD  omeprazole (PRILOSEC) 40 MG capsule Take 1 capsule (40 mg total) by mouth daily. 01/13/16   Biagio Borg, MD  ondansetron (ZOFRAN) 8 MG tablet Take 1 tablet (8 mg total) by mouth every 6 (six) hours as needed for nausea or vomiting. 01/13/16   Biagio Borg, MD  OVER THE COUNTER MEDICATION Take 1 capsule by mouth 2 (two) times daily. Beet Root 1000 mg per cap    Historical Provider, MD  OVER THE COUNTER MEDICATION 8 drops by Other route daily. Places 8 drops in 1 bottle of water  "Cell Food"    Historical Provider, MD  pregabalin (LYRICA) 50 MG capsule Take 50 mg by mouth at bedtime.     Historical Provider, MD  QUEtiapine (SEROQUEL) 100 MG tablet Take 1 tablet (100 mg total) by mouth at bedtime. 05/01/16   Biagio Borg, MD  sevelamer carbonate (RENVELA) 800 MG tablet Take 2,400-3,200 mg by mouth See admin instructions. 4 tabs with meals, 3 tabs with snacks    Historical Provider, MD  Turmeric Curcumin 500 MG CAPS Take 500 mg by mouth daily.    Historical Provider, MD   Current Facility-Administered Medications  Medication Dose Route Frequency Provider Last Rate Last Dose  . sodium polystyrene (KAYEXALATE) 15 GM/60ML suspension 30 g  30 g Oral Once Angelia Mould, MD       Current Outpatient Prescriptions  Medication Sig Dispense Refill  .  albuterol (PROVENTIL HFA;VENTOLIN HFA) 108 (90 BASE) MCG/ACT inhaler Inhale 2 puffs into the lungs every 6 (six) hours as needed for wheezing or shortness of breath. 1 Inhaler 5  . allopurinol (ZYLOPRIM) 100 MG tablet TAKE ONE TABLET BY MOUTH ONCE DAILY  90 tablet 0  . amLODipine (NORVASC) 5 MG tablet Take 1 tablet (5 mg total) by mouth daily. 30 tablet 3  . atorvastatin (LIPITOR) 40 MG tablet TAKE ONE TABLET BY MOUTH ONCE DAILY 30 tablet 5  . budesonide-formoterol (SYMBICORT) 160-4.5 MCG/ACT inhaler Inhale 2 puffs into the lungs 2 (two) times daily. Wait 1 min in between puffs, rinse mouth after use    . cetirizine (ZYRTEC) 10 MG tablet Take 10 mg by mouth daily.    . Cholecalciferol (VITAMIN D-3) 5000 units TABS Take 1 tablet by mouth daily.    . cinacalcet (SENSIPAR) 30 MG tablet Take 30 mg by mouth daily with supper.    . clonazePAM (KLONOPIN) 1 MG tablet Take 1 tablet (1 mg total) by mouth daily. (Patient taking differently: Take 1 mg by mouth 2 (two) times daily as needed for anxiety. ) 30 tablet 4  . colchicine 0.6 MG tablet Take 0.5 tablets (0.3 mg total) by mouth 2 (two) times a week. 5 tablet 0  . cyclobenzaprine (FLEXERIL) 10 MG tablet TAKE ONE TABLET BY MOUTH THREE TIMES DAILY AS NEEDED FOR MUSCLE SPASM 30 tablet 3  . darbepoetin (ARANESP) 25 MCG/0.42ML SOLN injection Inject 25 mcg into the vein every 7 (seven) days. Every Thursday at dialysis    . dextromethorphan-guaiFENesin (MUCINEX DM) 30-600 MG 12hr tablet Take 1 tablet by mouth daily.    Marland Kitchen doxercalciferol (HECTOROL) 4 MCG/2ML injection Inject 1 mL (2 mcg total) into the vein Every Tuesday,Thursday,and Saturday with dialysis.    . DULoxetine (CYMBALTA) 30 MG capsule Take 2 capsules (60 mg total) by mouth daily. 180 capsule 1  . hydrOXYzine (ATARAX/VISTARIL) 10 MG tablet Take 20 mg by mouth daily.     . Lactulose 20 GM/30ML SOLN Take 30 mLs by mouth daily as needed.     Marland Kitchen levothyroxine (SYNTHROID, LEVOTHROID) 175 MCG tablet Take 1  tablet (175 mcg total) by mouth daily before breakfast. 90 tablet 3  . mometasone-formoterol (DULERA) 100-5 MCG/ACT AERO Inhale 2 puffs into the lungs 2 (two) times daily.    . montelukast (SINGULAIR) 10 MG tablet TAKE ONE TABLET BY MOUTH AT BEDTIME. SCHEDULE APPOINTMENT FOR YEARLY PHYSICAL WITH LABS. MUST SEE DOCTOR FOR REFILLS 30 tablet 0  . multivitamin (RENA-VIT) TABS tablet Take 1 tablet by mouth daily.    Marland Kitchen omeprazole (PRILOSEC) 40 MG capsule Take 1 capsule (40 mg total) by mouth daily. 90 capsule 1  . ondansetron (ZOFRAN) 8 MG tablet Take 1 tablet (8 mg total) by mouth every 6 (six) hours as needed for nausea or vomiting. 30 tablet 1  . OVER THE COUNTER MEDICATION Take 1 capsule by mouth 2 (two) times daily. Beet Root 1000 mg per cap    . OVER THE COUNTER MEDICATION 8 drops by Other route daily. Places 8 drops in 1 bottle of water  "Cell Food"    . pregabalin (LYRICA) 50 MG capsule Take 50 mg by mouth at bedtime.     Marland Kitchen QUEtiapine (SEROQUEL) 100 MG tablet Take 1 tablet (100 mg total) by mouth at bedtime. 90 tablet 0  . sevelamer carbonate (RENVELA) 800 MG tablet Take 2,400-3,200 mg by mouth See admin instructions. 4 tabs with meals, 3 tabs with snacks    . Turmeric Curcumin 500 MG CAPS Take 500 mg by mouth daily.     Labs: Basic Metabolic Panel: No results for input(s): NA, K, CL, CO2, GLUCOSE, BUN, CREATININE, CALCIUM, PHOS in the last 168 hours.  Invalid input(s): ALB Liver  Function Tests: No results for input(s): AST, ALT, ALKPHOS, BILITOT, PROT, ALBUMIN in the last 168 hours. No results for input(s): LIPASE, AMYLASE in the last 168 hours. No results for input(s): AMMONIA in the last 168 hours. CBC: No results for input(s): WBC, NEUTROABS, HGB, HCT, MCV, PLT in the last 168 hours. Cardiac Enzymes: No results for input(s): CKTOTAL, CKMB, CKMBINDEX, TROPONINI in the last 168 hours. CBG: No results for input(s): GLUCAP in the last 168 hours. Iron Studies: No results for input(s):  IRON, TIBC, TRANSFERRIN, FERRITIN in the last 72 hours. Studies/Results: No results found.  ROS: As per HPI otherwise negative.   Physical Exam: Vitals:   11/16/16 1451 11/16/16 1454  BP:  (!) 159/80  Pulse:  95  Resp:  16  Temp:  98.7 F (37.1 C)  TempSrc:  Oral  SpO2:  90%  Weight: 67.1 kg (148 lb)      General: Well developed, well nourished Head: Normocephalic, atraumatic, sclera non-icteric, mucus membranes are moist Neck: Supple. JVD not elevated. Lungs: Clear bilaterally to auscultation without wheezes, rales, or rhonchi. Breathing is unlabored. Heart: RRR with S1 S2 2/6 systolic M. No JVD.   Abdomen: Soft, non-tender, non-distended with normoactive bowel sounds. No rebound/guarding. No obvious abdominal masses. M-S:  Strength and tone appear normal for age. Lower extremities:without edema or ischemic changes, no open wounds. Swelling mid tib/fib area LLE. Exquisitely painful to touch. Can move toes. Previous amputated toes R foot, 2nd L toe.  Neuro: Alert and oriented X 3. Moves all extremities spontaneously. Psych:  Responds to questions appropriately with a normal affect. Dialysis Access: RUA AVF + bruit  Dialysis Orders: T,Th, [place] 3 hours 45 minutes 180NRe 400/800 69.5 kg 2.0 K/ 2.5 Ca linear Na -Heparin 1200 units IV TIW -Hectoral 6 mcg IV TIW (Last PTH 693 Last Ca 8.8 C Ca 9.0 11/01/2016) -Venofer 50 mg IV q weekly (last dose 11/10/2016 last Fe 87 Tsat 40 11/01/2016)  BMD meds:  Sensipar 60 mg PO Q PM Renvela 800 mg 4 tablets TID/meals  Assessment/Plan: 1.  Mechanical Fall/Possible Fx L tibia Orthopedics consulted. 2.  ESRD - T,Th,S. Has not had HD since 11/10/2016. Labs pending. If labs OK will hold off HD until tomorrow.  3.  Hypertension/volume  - BP elevated but pt is in pain D/T fall. Does not appear to be volume overloaded. Getting CXR. Per home med list: Amlodipine 5 mg PO q PM.  4.  Anemia  -Labs ordered. Last HGB in  center was 12.0 11/11/2015. No ESA. Has been on weekly Fe.  5.  Metabolic bone disease -  Continue binders, senispar, VDRA 6.  Nutrition - NPO at present. Renal diet when able to eat. 7.  H/O R Squamous Cell Lung Ca-completely chemo last year.   Rita H. Owens Shark, NP-C 11/16/2016, 3:38 PM  [employer] [phone] I have seen and examined this patient and agree with plan per Juanell Fairly.  76yo BF with ESRD TTS but has not gone to HD since last Sat.  Today she tripped and fell and fractured her Lt tibia.  CXR does not show pulm edema. K 6.9 so will plan HD tonight. 11/16/2016 5:15 PM

## 2016-11-16 NOTE — ED Notes (Signed)
Phlebotomy at bedside.

## 2016-11-17 DIAGNOSIS — S82402A Unspecified fracture of shaft of left fibula, initial encounter for closed fracture: Secondary | ICD-10-CM

## 2016-11-17 DIAGNOSIS — S82202A Unspecified fracture of shaft of left tibia, initial encounter for closed fracture: Secondary | ICD-10-CM

## 2016-11-17 DIAGNOSIS — W19XXXA Unspecified fall, initial encounter: Secondary | ICD-10-CM

## 2016-11-17 LAB — BASIC METABOLIC PANEL
ANION GAP: 22 — AB (ref 5–15)
BUN: 41 mg/dL — ABNORMAL HIGH (ref 6–20)
CALCIUM: 8.8 mg/dL — AB (ref 8.9–10.3)
CO2: 23 mmol/L (ref 22–32)
Chloride: 91 mmol/L — ABNORMAL LOW (ref 101–111)
Creatinine, Ser: 8.21 mg/dL — ABNORMAL HIGH (ref 0.44–1.00)
GFR calc Af Amer: 5 mL/min — ABNORMAL LOW (ref >60)
GFR calc non Af Amer: 4 mL/min — ABNORMAL LOW (ref >60)
GLUCOSE: 110 mg/dL — AB (ref 65–99)
POTASSIUM: 5.1 mmol/L (ref 3.5–5.1)
Sodium: 136 mmol/L (ref 135–145)

## 2016-11-17 LAB — GLUCOSE, CAPILLARY
Glucose-Capillary: 107 mg/dL — ABNORMAL HIGH (ref 65–99)
Glucose-Capillary: 100 mg/dL — ABNORMAL HIGH (ref 65–99)
Glucose-Capillary: 86 mg/dL (ref 65–99)

## 2016-11-17 LAB — TYPE AND SCREEN
ABO/RH(D): O POS
ANTIBODY SCREEN: NEGATIVE

## 2016-11-17 LAB — PROTIME-INR
INR: 1.47
Prothrombin Time: 18 seconds — ABNORMAL HIGH (ref 11.4–15.2)

## 2016-11-17 LAB — SURGICAL PCR SCREEN
MRSA, PCR: NEGATIVE
Staphylococcus aureus: POSITIVE — AB

## 2016-11-17 LAB — ABO/RH: ABO/RH(D): O POS

## 2016-11-17 MED ORDER — MORPHINE SULFATE (PF) 4 MG/ML IV SOLN
INTRAVENOUS | Status: AC
  Filled 2016-11-17: qty 1

## 2016-11-17 MED ORDER — SEVELAMER CARBONATE 800 MG PO TABS
2400.0000 mg | ORAL_TABLET | ORAL | Status: DC | PRN
  Filled 2016-11-17 (×2): qty 3

## 2016-11-17 MED ORDER — OXYCODONE HCL 5 MG PO TABS
5.0000 mg | ORAL_TABLET | ORAL | Status: DC | PRN
  Administered 2016-11-17 – 2016-11-23 (×5): 5 mg via ORAL
  Filled 2016-11-17 (×2): qty 1

## 2016-11-17 MED ORDER — LIDOCAINE 2% (20 MG/ML) 5 ML SYRINGE
INTRAMUSCULAR | Status: AC
  Filled 2016-11-17: qty 5

## 2016-11-17 MED ORDER — BLISTEX MEDICATED EX OINT
TOPICAL_OINTMENT | CUTANEOUS | Status: DC | PRN
  Administered 2016-11-17: 03:00:00 via TOPICAL
  Filled 2016-11-17: qty 6.3

## 2016-11-17 MED ORDER — LEVOTHYROXINE SODIUM 75 MCG PO TABS
175.0000 ug | ORAL_TABLET | Freq: Every day | ORAL | Status: DC
  Administered 2016-11-17 – 2016-11-24 (×6): 175 ug via ORAL
  Filled 2016-11-17 (×6): qty 1

## 2016-11-17 MED ORDER — CLINDAMYCIN PHOSPHATE 900 MG/50ML IV SOLN
900.0000 mg | INTRAVENOUS | Status: AC
  Administered 2016-11-18: 900 mg via INTRAVENOUS

## 2016-11-17 MED ORDER — PROPOFOL 10 MG/ML IV BOLUS
INTRAVENOUS | Status: AC
  Filled 2016-11-17: qty 20

## 2016-11-17 MED ORDER — FENTANYL CITRATE (PF) 250 MCG/5ML IJ SOLN
INTRAMUSCULAR | Status: AC
  Filled 2016-11-17: qty 5

## 2016-11-17 MED ORDER — ROCURONIUM BROMIDE 50 MG/5ML IV SOSY
PREFILLED_SYRINGE | INTRAVENOUS | Status: AC
  Filled 2016-11-17: qty 5

## 2016-11-17 MED ORDER — POVIDONE-IODINE 10 % EX SWAB: 2.0000 "application " | Freq: Once | CUTANEOUS | Status: DC

## 2016-11-17 MED ORDER — NEPRO/CARBSTEADY PO LIQD
237.0000 mL | Freq: Two times a day (BID) | ORAL | Status: DC
  Administered 2016-11-19 – 2016-11-24 (×7): 237 mL via ORAL
  Filled 2016-11-17 (×7): qty 237

## 2016-11-17 NOTE — Progress Notes (Signed)
Patient ID: [name] [name], female   DOB: 01-26-1941, 76 y.o.   MRN: [id number] We will need to delay surgery until tomorrow am (4/15) due to her need to have dialysis again today.  This has been communicated with her as well as the Renal and Medicine teams.

## 2016-11-17 NOTE — Progress Notes (Signed)
Initial Nutrition Assessment  DOCUMENTATION CODES:   Not applicable  INTERVENTION:  Provide Nepro Shake po BID, each supplement provides 425 kcal and 19 grams protein.  Encourage adequate PO intake.   NUTRITION DIAGNOSIS:   Increased nutrient needs related to chronic illness as evidenced by estimated needs.  GOAL:   Patient will meet greater than or equal to 90% of their needs  MONITOR:   PO intake, Supplement acceptance, Labs, Weight trends, Skin, I & O's  REASON FOR ASSESSMENT:   Consult Assessment of nutrition requirement/status  ASSESSMENT:   76 year old female with ESRD on HD TTS, hypertension, osteoarthritis, 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 Patient had missed her dialysis session on the day of admission and 2 days ago due to passing of her friend. X-ray of her left leg showed acute comminuted fracture off mid to distal left tibia.   Plans for HD again today and surgery tomorrow. Pt reports having a decreased appetite due to current pain she is having. Meal completion 50% this AM. Pt reports she has been trying to consume at least 2-3 meals daily at home PTA. Pt does report consume Nepro Shake at least twice daily. Pt reports weight has been stable. RD to order Nepro Shake to aid in caloric and protein needs.   Pt with no observed significant fat or muscle mass loss.   Labs and medications reviewed.   Diet Order:  Diet renal/carb modified with fluid restriction Diet-HS Snack? Nothing; Room service appropriate? Yes; Fluid consistency: Thin Diet NPO time specified  Skin:  Reviewed, no issues  Last BM:  4/14  Height:   Ht Readings from Last 1 Encounters:  10/22/16 5' 7.5" (1.715 m)    Weight:   Wt Readings from Last 1 Encounters:  11/16/16 147 lb 14.9 oz (67.1 kg)    Ideal Body Weight:  62.5 kg  BMI:  Body mass index is  22.83 kg/m.  Estimated Nutritional Needs:   Kcal:  1800-2000  Protein:  85-95 grams  Fluid:  Per MD  EDUCATION NEEDS:   No education needs identified at this time  [name] Parker, MS, RD, LDN Pager # [phone] After hours/ weekend pager # [phone]

## 2016-11-17 NOTE — Progress Notes (Signed)
Received report from Darnelle Catalan, RN from hemodialysis.  Patient's current BP is 72/41.  Made Dr. Tana Coast via text page.

## 2016-11-17 NOTE — Progress Notes (Addendum)
S: Some pain but better O:BP (!) 71/42 (BP Location: Left Arm)   Pulse (!) 104   Temp 98.3 F (36.8 C) (Oral)   Resp 17   Wt 67.1 kg (147 lb 14.9 oz)   SpO2 95%   BMI 22.83 kg/m   Intake/Output Summary (Last 24 hours) at 11/17/16 0823 Last data filed at 11/17/16 0010  Gross per 24 hour  Intake              240 ml  Output             2000 ml  Net            -1760 ml   Weight change:  EXB:MWUXL and alert CVS:RRR Resp: clear Abd:+ BS NTND Ext: Lt leg in brace  RUA AVF + bruit NEURO: CNI Ox3 no asterixis   . allopurinol  100 mg Oral Daily  . amLODipine  5 mg Oral Daily  . atorvastatin  40 mg Oral Daily  . cholecalciferol  5,000 Units Oral Daily  . cinacalcet  30 mg Oral Q supper  . clindamycin (CLEOCIN) IV  900 mg Intravenous To SSTC  . dextromethorphan-guaiFENesin  1 tablet Oral Daily  . DULoxetine  60 mg Oral Daily  . heparin  5,000 Units Subcutaneous Q8H  . hydrOXYzine  20 mg Oral Daily  . levothyroxine  175 mcg Oral QAC breakfast  . mometasone-formoterol  2 puff Inhalation BID  . montelukast  10 mg Oral QHS  . multivitamin  1 tablet Oral QHS  . pantoprazole  40 mg Oral Daily  . povidone-iodine  2 application Topical Once  . pregabalin  50 mg Oral QHS  . QUEtiapine  100 mg Oral QHS  . sevelamer carbonate  3,200 mg Oral TID WC   Dg Chest 2 View  Result Date: 11/16/2016 CLINICAL DATA:  Initial evaluation for acute trauma, fall. EXAM: CHEST  2 VIEW COMPARISON:  Prior radiograph from 05/29/2016. FINDINGS: Cardiac and mediastinal silhouettes are stable in size and contour, and remain within normal limits. Aortic atherosclerosis. Lungs mildly hypoinflated. Persistent irregular and linear opacity at the right lung apex, similar to previous. Finding suspected to largely reflect post treatment changes. Residual malignancy not excluded. Similarly, superimposed infection not excluded either. No other focal airspace disease. Mild scarring at the left lung base. No pulmonary edema  or pleural effusion. No pneumothorax. No acute osseus abnormality.  Scoliosis. IMPRESSION: 1. Persistent right upper lobe airspace disease. Findings suspected to largely reflect post treatment changes. Residual malignancy or superimposed infection not excluded. 2. Left basilar scarring. 3. Aortic atherosclerosis. Electronically Signed   By: Jeannine Boga M.D.   On: 11/16/2016 16:49   Dg Tibia/fibula Left  Result Date: 11/16/2016 CLINICAL DATA:  Initial evaluation for acute trauma, fall. EXAM: LEFT TIBIA AND FIBULA - 2 VIEW COMPARISON:  None. FINDINGS: Acute comminuted fracture of the mid-distal shaft of the left tibia. Slight lateral no definite fibular fracture. Overlying soft tissue swelling at the mid leg. Displacement of the main butterfly fragment. IMPRESSION: Acute comminuted fracture of the mid-distal left tibia with overlying soft tissue swelling. Electronically Signed   By: Jeannine Boga M.D.   On: 11/16/2016 16:46   BMET    Component Value Date/Time   NA 136 11/17/2016 0117   NA 140 11/21/2015 1054   K 5.1 11/17/2016 0117   K 4.8 11/21/2015 1054   CL 91 (L) 11/17/2016 0117   CO2 23 11/17/2016 0117   CO2 24 11/21/2015 1054  GLUCOSE 110 (H) 11/17/2016 0117   GLUCOSE 111 11/21/2015 1054   BUN 41 (H) 11/17/2016 0117   BUN 48.4 (H) 11/21/2015 1054   CREATININE 8.21 (H) 11/17/2016 0117   CREATININE 8.2 (HH) 11/21/2015 1054   CALCIUM 8.8 (L) 11/17/2016 0117   CALCIUM 9.2 11/21/2015 1054   GFRNONAA 4 (L) 11/17/2016 0117   GFRAA 5 (L) 11/17/2016 0117   CBC    Component Value Date/Time   WBC 14.0 (H) 11/16/2016 1554   RBC 4.51 11/16/2016 1554   HGB 13.6 11/16/2016 1814   HGB 11.1 (L) 11/21/2015 1054   HCT 40.0 11/16/2016 1814   HCT 34.0 (L) 11/21/2015 1054   PLT 349 11/16/2016 1554   PLT 194 11/21/2015 1054   MCV 86.3 11/16/2016 1554   MCV 91.4 11/21/2015 1054   MCH 29.0 11/16/2016 1554   MCHC 33.7 11/16/2016 1554   RDW 18.1 (H) 11/16/2016 1554   RDW 17.3 (H)  11/21/2015 1054   LYMPHSABS 0.9 11/16/2016 1554   LYMPHSABS 1.4 11/21/2015 1054   MONOABS 1.6 (H) 11/16/2016 1554   MONOABS 0.6 11/21/2015 1054   EOSABS 0.5 11/16/2016 1554   EOSABS 0.8 (H) 11/21/2015 1054   BASOSABS 0.1 11/16/2016 1554   BASOSABS 0.0 11/21/2015 1054     Assessment: 1. Fx Lt tibia 2. ESRD TTS 3. HTN 4. Sec HPTH on Vit D/sensipar  Plan: 1. Will plan HD today and discussed with ortho the plans for surgery tomorrow 2.  Amlodipine was on her outpt med list but she does not take it.  Will Dc it and follow BP   Thaniel Coluccio T

## 2016-11-17 NOTE — Progress Notes (Signed)
[facility]                                                                              Patient Demographics  [name] [name], is a 76 y.o. female, DOB - 09/13/1940, ZTI:[id number]  Admit date - 11/16/2016   Admitting Physician Vianne Bulls, MD  Outpatient Primary MD for the patient is Cathlean Cower, MD  Outpatient specialists:   LOS - 1  days    Chief Complaint  Patient presents with  . 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 Patient had missed her dialysis session on the day of admission and 2 days ago due to passing of her friend. X-ray of her left leg showed acute comminuted fracture off mid to distal left tibia. BMET showed BUN 95, potassium 6.9. EKG showed peaked T waves. Orthopedics and nephrology were consulted.   Assessment & Plan    Principal Problem:   Left tibia fracture  - Secondary to mechanical fall, x-rays showed comminuted fracture of mid-to-distal left tibia   - Orthopedic surgery consulted, recommended OR in a.m. 4/15  - NPO after MN, pain control and DVT prophylaxis per orthopedics   Active problems  Hyperkalemia - Serum potassium is 6.9 on admission with peaked T-waves noted on EKG  - Secondary to ESRD with missed dialysis sessions  - Neurology was consulted and underwent urgent hemodialysis, potassium improved to 5.1  - Patient undergoing repeat HD today   ESRD  - Pt usually dialyzes TTS, but missed 2 sessions leading up to admission  - Underwent urgent hemodialysis overnight, repeat HD again today - Continue binders, SLIV    COPD   - No wheezing or respiratory complaints on admission  - Continue scheduled Dulera and prn albuterol    Hypertension  - BP modestly elevated in ED in setting of pain   - Patient however reports that her BP runs lower at home and does not take Norvasc, which is discontinued. Currently BP low, asymptomatic   Lung cancer, squamous cell - RUL SCC, followed by oncologist at [facility]  - She completed chemoradiation and is now undergoing imaging surveillance q70mo  - Per onc notes from Feb, there is no evidence for recurrence and her chronic dry cough is likely secondary to treatment and her new baseline    GERD - EGD from 2016 with pangastritis and mild duodenitis, small hiatal hernia, no esophagitis  - Managed with daily PPI at home, will continue   Hypothyroidism  - Appears stable, continue Synthroid    Code Status: Full CODE STATUS DVT Prophylaxis:  Heparin subcutaneous Family Communication: Discussed in detail with the patient, all imaging results, lab results explained to the patient    Disposition Plan:   Time Spent in minutes   25 minutes  Procedures:  HD  Consultants:   Orthopedics Nephrology  Antimicrobials:      Medications  Scheduled Meds: . allopurinol  100 mg Oral Daily  . atorvastatin  40 mg Oral Daily  . cholecalciferol  5,000 Units Oral Daily  . cinacalcet  30 mg Oral Q supper  . clindamycin (CLEOCIN) IV  900 mg Intravenous To SSTC  . dextromethorphan-guaiFENesin  1 tablet Oral Daily  . DULoxetine  60 mg Oral Daily  . heparin  5,000 Units Subcutaneous Q8H  . hydrOXYzine  20 mg Oral Daily  . levothyroxine  175 mcg Oral QAC breakfast  . mometasone-formoterol  2 puff Inhalation BID  . montelukast  10 mg Oral QHS  . multivitamin  1 tablet Oral QHS  . pantoprazole  40 mg Oral Daily  . povidone-iodine  2 application Topical Once  . pregabalin  50 mg Oral QHS  . QUEtiapine  100 mg Oral QHS  . sevelamer carbonate  3,200 mg Oral TID WC   Continuous Infusions: PRN Meds:.sodium chloride, sodium chloride, albuterol, alteplase, clonazePAM, cyclobenzaprine, heparin, hydrALAZINE, HYDROcodone-acetaminophen, lidocaine (PF),  lidocaine-prilocaine, lip balm, morphine injection, ondansetron (ZOFRAN) IV, oxyCODONE, pentafluoroprop-tetrafluoroeth, polyethylene glycol, sevelamer carbonate   Antibiotics   Anti-infectives    Start     Dose/Rate Route Frequency Ordered Stop   11/17/16 0800  clindamycin (CLEOCIN) IVPB 900 mg     900 mg 100 mL/hr over 30 Minutes Intravenous To Short Stay 11/17/16 0204 11/18/16 0800        Subjective:   [name] [name] was seen and examined today. Complaining of pain in the left leg 7/10, sharp and intermittent. No nausea or vomiting. Patient denies dizziness, chest pain, shortness of breath, abdominal pain, D/C, new weakness, numbess, tingling. No acute events overnight.    Objective:   Vitals:   11/17/16 0140 11/17/16 0643 11/17/16 0646 11/17/16 0906  BP: (!) 115/58 (!) 84/41 (!) 71/42   Pulse: (!) 112 (!) 104    Resp:      Temp: 100.3 F (37.9 C) 98.3 F (36.8 C)    TempSrc: Oral Oral    SpO2:  95%  94%  Weight:        Intake/Output Summary (Last 24 hours) at 11/17/16 1235 Last data filed at 11/17/16 0700  Gross per 24 hour  Intake              480 ml  Output             2000 ml  Net            -1520 ml     Wt Readings from Last 3 Encounters:  11/16/16 67.1 kg (147 lb 14.9 oz)  10/22/16 67.1 kg (148 lb)  11/21/15 71.3 kg (157 lb 1.6 oz)     Exam  General: Alert and oriented x 3, NAD  HEENT:  PERRLA, EOMI, Anicteric Sclera, mucous membranes moist.   Neck: Supple, no JVD, no masses  Cardiovascular: S1 S2 auscultated, no rubs, murmurs or gallops. Regular rate and rhythm.  Respiratory: Clear to auscultation bilaterally, no wheezing, rales or rhonchi  Gastrointestinal: Soft, nontender, nondistended, + bowel sounds  Ext: no cyanosis clubbing, Left leg in splint  Neuro: no new deficits  Skin: No rashes  Psych: Normal affect and demeanor, alert and oriented x3    Data Reviewed:  I have personally reviewed following labs and imaging studies  Micro  Results Recent Results (from the past 240 hour(s))  Surgical pcr screen     Status: Abnormal   Collection Time: 11/17/16  2:39 AM  Result Value Ref Range Status   MRSA, PCR NEGATIVE NEGATIVE Final  Staphylococcus aureus POSITIVE (A) NEGATIVE Final    Comment:        The Xpert SA Assay (FDA approved for NASAL specimens in patients over 65 years of age), is one component of a comprehensive surveillance program.  Test performance has been validated by [facility] for patients greater than or equal to 84 year old. It is not intended to diagnose infection nor to guide or monitor treatment.     Radiology Reports Dg Chest 2 View  Result Date: 11/16/2016 CLINICAL DATA:  Initial evaluation for acute trauma, fall. EXAM: CHEST  2 VIEW COMPARISON:  Prior radiograph from 05/29/2016. FINDINGS: Cardiac and mediastinal silhouettes are stable in size and contour, and remain within normal limits. Aortic atherosclerosis. Lungs mildly hypoinflated. Persistent irregular and linear opacity at the right lung apex, similar to previous. Finding suspected to largely reflect post treatment changes. Residual malignancy not excluded. Similarly, superimposed infection not excluded either. No other focal airspace disease. Mild scarring at the left lung base. No pulmonary edema or pleural effusion. No pneumothorax. No acute osseus abnormality.  Scoliosis. IMPRESSION: 1. Persistent right upper lobe airspace disease. Findings suspected to largely reflect post treatment changes. Residual malignancy or superimposed infection not excluded. 2. Left basilar scarring. 3. Aortic atherosclerosis. Electronically Signed   By: Jeannine Boga M.D.   On: 11/16/2016 16:49   Dg Tibia/fibula Left  Result Date: 11/16/2016 CLINICAL DATA:  Initial evaluation for acute trauma, fall. EXAM: LEFT TIBIA AND FIBULA - 2 VIEW COMPARISON:  None. FINDINGS: Acute comminuted fracture of the mid-distal shaft of the left tibia. Slight lateral no  definite fibular fracture. Overlying soft tissue swelling at the mid leg. Displacement of the main butterfly fragment. IMPRESSION: Acute comminuted fracture of the mid-distal left tibia with overlying soft tissue swelling. Electronically Signed   By: Jeannine Boga M.D.   On: 11/16/2016 16:46   Xr Knee 1-2 Views Left  Result Date: 10/23/2016 Two-view radiographs of the left knee shows bone-on-bone contact lateral joint line there is subchondral cyst with sclerosis along the joint line. There are periarticular cyst with essentially bone-on-bone contact lateral joint line.   Lab Data:  CBC:  Recent Labs Lab 11/16/16 1554 11/16/16 1814  WBC 14.0*  --   NEUTROABS 10.9*  --   HGB 13.1 13.6  HCT 38.9 40.0  MCV 86.3  --   PLT 349  --    Basic Metabolic Panel:  Recent Labs Lab 11/16/16 1814 11/17/16 0117  NA 130* 136  K 6.9* 5.1  CL 101 91*  CO2  --  23  GLUCOSE 130* 110*  BUN 95* 41*  CREATININE 16.20* 8.21*  CALCIUM  --  8.8*   GFR: Estimated Creatinine Clearance: 5.8 mL/min (A) (by C-G formula based on SCr of 8.21 mg/dL (H)). Liver Function Tests: No results for input(s): AST, ALT, ALKPHOS, BILITOT, PROT, ALBUMIN in the last 168 hours. No results for input(s): LIPASE, AMYLASE in the last 168 hours. No results for input(s): AMMONIA in the last 168 hours. Coagulation Profile:  Recent Labs Lab 11/17/16 0117  INR 1.47   Cardiac Enzymes: No results for input(s): CKTOTAL, CKMB, CKMBINDEX, TROPONINI in the last 168 hours. BNP (last 3 results) No results for input(s): PROBNP in the last 8760 hours. HbA1C: No results for input(s): HGBA1C in the last 72 hours. CBG:  Recent Labs Lab 11/17/16 0147 11/17/16 0650 11/17/16 1131  GLUCAP 107* 100* 86   Lipid Profile: No results for input(s): CHOL, HDL, LDLCALC, TRIG, CHOLHDL, LDLDIRECT in  the last 72 hours. Thyroid Function Tests: No results for input(s): TSH, T4TOTAL, FREET4, T3FREE, THYROIDAB in the last 72  hours. Anemia Panel: No results for input(s): VITAMINB12, FOLATE, FERRITIN, TIBC, IRON, RETICCTPCT in the last 72 hours. Urine analysis:    Component Value Date/Time   COLORURINE BROWN (A) 08/04/2014 0254   APPEARANCEUR TURBID (A) 08/04/2014 0254   LABSPEC 1.017 08/04/2014 0254   PHURINE 6.0 08/04/2014 0254   GLUCOSEU NEGATIVE 08/04/2014 0254   GLUCOSEU NEGATIVE 10/07/2012 1421   HGBUR LARGE (A) 08/04/2014 0254   BILIRUBINUR SMALL (A) 08/04/2014 0254   KETONESUR 15 (A) 08/04/2014 0254   PROTEINUR >300 (A) 08/04/2014 0254   UROBILINOGEN 0.2 08/04/2014 0254   NITRITE NEGATIVE 08/04/2014 0254   LEUKOCYTESUR LARGE (A) 08/04/2014 0254     Ripudeep Rai M.D. [facility] 11/17/2016, 12:35 PM  Pager: [phone] Between 7am to 7pm - call Pager - [phone]  After 7pm go to [email or web address] - password [email or web address]  Call night coverage person covering after 7pm

## 2016-11-17 NOTE — Progress Notes (Signed)
Patient ID: [name] [name], female   DOB: September 21, 1940, 76 y.o.   MRN: [id number] She did get dialysis last evening and her K+ improved.  She understands that surgery has been recommended and that we will proceed to surgery this am for IM nail placement in her left tibia to stabilize the bone.  I have spoke to her and her family in length about this as well.

## 2016-11-18 ENCOUNTER — Encounter (HOSPITAL_COMMUNITY)
Admission: EM | Disposition: A | Discharge status: Skilled Nursing Facility | Payer: Self-pay | Source: Home / Self Care | Attending: Internal Medicine

## 2016-11-18 ENCOUNTER — Inpatient Hospital Stay (HOSPITAL_COMMUNITY): Payer: Medicare Other

## 2016-11-18 ENCOUNTER — Inpatient Hospital Stay (HOSPITAL_COMMUNITY): Payer: Medicare Other | Admitting: Anesthesiology

## 2016-11-18 HISTORY — PX: TIBIA IM NAIL INSERTION: SHX2516

## 2016-11-18 LAB — CBC
HEMATOCRIT: 33.4 % — AB (ref 36.0–46.0)
Hemoglobin: 10.5 g/dL — ABNORMAL LOW (ref 12.0–15.0)
MCH: 28 pg (ref 26.0–34.0)
MCHC: 31.4 g/dL (ref 30.0–36.0)
MCV: 89.1 fL (ref 78.0–100.0)
Platelets: 229 10*3/uL (ref 150–400)
RBC: 3.75 MIL/uL — ABNORMAL LOW (ref 3.87–5.11)
RDW: 18.6 % — AB (ref 11.5–15.5)
WBC: 13.1 10*3/uL — ABNORMAL HIGH (ref 4.0–10.5)

## 2016-11-18 LAB — RENAL FUNCTION PANEL
ALBUMIN: 2.3 g/dL — AB (ref 3.5–5.0)
ANION GAP: 11 (ref 5–15)
BUN: 19 mg/dL (ref 6–20)
CO2: 28 mmol/L (ref 22–32)
Calcium: 8 mg/dL — ABNORMAL LOW (ref 8.9–10.3)
Chloride: 93 mmol/L — ABNORMAL LOW (ref 101–111)
Creatinine, Ser: 5 mg/dL — ABNORMAL HIGH (ref 0.44–1.00)
GFR calc Af Amer: 9 mL/min — ABNORMAL LOW (ref >60)
GFR calc non Af Amer: 8 mL/min — ABNORMAL LOW (ref >60)
GLUCOSE: 114 mg/dL — AB (ref 65–99)
PHOSPHORUS: 5.7 mg/dL — AB (ref 2.5–4.6)
POTASSIUM: 5.1 mmol/L (ref 3.5–5.1)
Sodium: 132 mmol/L — ABNORMAL LOW (ref 135–145)

## 2016-11-18 LAB — GLUCOSE, CAPILLARY
Glucose-Capillary: 89 mg/dL (ref 65–99)
Glucose-Capillary: 77 mg/dL (ref 65–99)

## 2016-11-18 LAB — LACTIC ACID, PLASMA: Lactic Acid, Venous: 1.1 mmol/L (ref 0.5–1.9)

## 2016-11-18 LAB — HEPATITIS B SURFACE ANTIGEN: Hepatitis B Surface Ag: NEGATIVE

## 2016-11-18 SURGERY — INSERTION, INTRAMEDULLARY ROD, TIBIA
Anesthesia: General | Site: Leg Lower | Laterality: Left

## 2016-11-18 MED ORDER — PROPOFOL 10 MG/ML IV BOLUS
INTRAVENOUS | Status: AC
  Filled 2016-11-18: qty 20

## 2016-11-18 MED ORDER — CHLORHEXIDINE GLUCONATE CLOTH 2 % EX PADS: 6.0000 | MEDICATED_PAD | Freq: Every day | CUTANEOUS | Status: DC

## 2016-11-18 MED ORDER — LIDOCAINE HCL (CARDIAC) 20 MG/ML IV SOLN
INTRAVENOUS | Status: DC | PRN
  Administered 2016-11-18: 100 mg via INTRAVENOUS

## 2016-11-18 MED ORDER — ONDANSETRON HCL 4 MG/2ML IJ SOLN
INTRAMUSCULAR | Status: DC | PRN
  Administered 2016-11-18: 4 mg via INTRAVENOUS

## 2016-11-18 MED ORDER — ONDANSETRON HCL 4 MG/2ML IJ SOLN
INTRAMUSCULAR | Status: AC
  Filled 2016-11-18: qty 2

## 2016-11-18 MED ORDER — FENTANYL CITRATE (PF) 250 MCG/5ML IJ SOLN
INTRAMUSCULAR | Status: AC
  Filled 2016-11-18: qty 5

## 2016-11-18 MED ORDER — HYDROMORPHONE HCL 1 MG/ML IJ SOLN
INTRAMUSCULAR | Status: AC
  Administered 2016-11-18: 0.25 mg via INTRAVENOUS
  Filled 2016-11-18: qty 0.5

## 2016-11-18 MED ORDER — SODIUM CHLORIDE 0.9 % IV SOLN
INTRAVENOUS | Status: DC | PRN
  Administered 2016-11-18: 10:00:00 via INTRAVENOUS

## 2016-11-18 MED ORDER — MEPERIDINE HCL 25 MG/ML IJ SOLN: 6.2500 mg | INTRAMUSCULAR | Status: DC | PRN

## 2016-11-18 MED ORDER — PHENYLEPHRINE HCL 10 MG/ML IJ SOLN
INTRAVENOUS | Status: DC | PRN
  Administered 2016-11-18: 60 ug/min via INTRAVENOUS

## 2016-11-18 MED ORDER — 0.9 % SODIUM CHLORIDE (POUR BTL) OPTIME
TOPICAL | Status: DC | PRN
  Administered 2016-11-18: 1000 mL

## 2016-11-18 MED ORDER — CLINDAMYCIN PHOSPHATE 900 MG/50ML IV SOLN
INTRAVENOUS | Status: AC
  Filled 2016-11-18: qty 50

## 2016-11-18 MED ORDER — MUPIROCIN 2 % EX OINT
1.0000 "application " | TOPICAL_OINTMENT | Freq: Two times a day (BID) | CUTANEOUS | Status: AC
  Administered 2016-11-18 – 2016-11-22 (×9): 1 via NASAL
  Filled 2016-11-18 (×5): qty 22

## 2016-11-18 MED ORDER — FENTANYL CITRATE (PF) 100 MCG/2ML IJ SOLN
INTRAMUSCULAR | Status: DC | PRN
  Administered 2016-11-18: 25 ug via INTRAVENOUS
  Administered 2016-11-18: 50 ug via INTRAVENOUS
  Administered 2016-11-18: 75 ug via INTRAVENOUS

## 2016-11-18 MED ORDER — HYDROMORPHONE HCL 1 MG/ML IJ SOLN
INTRAMUSCULAR | Status: AC
  Filled 2016-11-18: qty 0.5

## 2016-11-18 MED ORDER — SODIUM CHLORIDE 0.9 % IV BOLUS (SEPSIS)
500.0000 mL | Freq: Once | INTRAVENOUS | Status: AC
  Administered 2016-11-18: 10:00:00 via INTRAVENOUS

## 2016-11-18 MED ORDER — ETOMIDATE 2 MG/ML IV SOLN
INTRAVENOUS | Status: DC | PRN
  Administered 2016-11-18: 14 mg via INTRAVENOUS

## 2016-11-18 MED ORDER — SUGAMMADEX SODIUM 200 MG/2ML IV SOLN
INTRAVENOUS | Status: DC | PRN
  Administered 2016-11-18: 150 mg via INTRAVENOUS

## 2016-11-18 MED ORDER — SUGAMMADEX SODIUM 200 MG/2ML IV SOLN
INTRAVENOUS | Status: AC
  Filled 2016-11-18: qty 2

## 2016-11-18 MED ORDER — WHITE PETROLATUM GEL
Status: AC
  Filled 2016-11-18: qty 1

## 2016-11-18 MED ORDER — ONDANSETRON HCL 4 MG/2ML IJ SOLN: 4.0000 mg | Freq: Once | INTRAMUSCULAR | Status: DC | PRN

## 2016-11-18 MED ORDER — STERILE WATER FOR IRRIGATION IR SOLN
Status: DC | PRN
  Administered 2016-11-18: 1000 mL

## 2016-11-18 MED ORDER — HYDROMORPHONE HCL 1 MG/ML IJ SOLN
0.2500 mg | INTRAMUSCULAR | Status: DC | PRN
  Administered 2016-11-18 (×2): 0.25 mg via INTRAVENOUS
  Administered 2016-11-18: 0.5 mg via INTRAVENOUS
  Administered 2016-11-18 (×2): 0.25 mg via INTRAVENOUS

## 2016-11-18 MED ORDER — MIDODRINE HCL 5 MG PO TABS
10.0000 mg | ORAL_TABLET | Freq: Two times a day (BID) | ORAL | Status: DC
  Administered 2016-11-19 – 2016-11-22 (×8): 10 mg via ORAL
  Filled 2016-11-18 (×9): qty 2

## 2016-11-18 SURGICAL SUPPLY — 75 items
BANDAGE ACE 4X5 VEL STRL LF (GAUZE/BANDAGES/DRESSINGS) ×2 IMPLANT
BANDAGE ACE 6X5 VEL STRL LF (GAUZE/BANDAGES/DRESSINGS) ×2 IMPLANT
BANDAGE ELASTIC 4 VELCRO ST LF (GAUZE/BANDAGES/DRESSINGS) ×1 IMPLANT
BANDAGE ELASTIC 6 VELCRO ST LF (GAUZE/BANDAGES/DRESSINGS) ×1 IMPLANT
BIT DRILL AO GAMMA 4.2X130 (BIT) ×1 IMPLANT
BIT DRILL AO GAMMA 4.2X180 (BIT) ×1 IMPLANT
BIT DRILL AO GAMMA 4.2X340 (BIT) ×1 IMPLANT
BLADE SURG 10 STRL SS (BLADE) ×2 IMPLANT
BNDG COHESIVE 4X5 TAN STRL (GAUZE/BANDAGES/DRESSINGS) ×2 IMPLANT
BNDG GAUZE ELAST 4 BULKY (GAUZE/BANDAGES/DRESSINGS) ×2 IMPLANT
COVER MAYO STAND STRL (DRAPES) ×2 IMPLANT
COVER SURGICAL LIGHT HANDLE (MISCELLANEOUS) ×4 IMPLANT
CUFF TOURNIQUET SINGLE 34IN LL (TOURNIQUET CUFF) IMPLANT
CUFF TOURNIQUET SINGLE 44IN (TOURNIQUET CUFF) IMPLANT
DRAPE C-ARM 42X72 X-RAY (DRAPES) ×2 IMPLANT
DRAPE HALF SHEET 40X57 (DRAPES) ×2 IMPLANT
DRAPE IMP U-DRAPE 54X76 (DRAPES) ×2 IMPLANT
DRAPE INCISE IOBAN 66X45 STRL (DRAPES) IMPLANT
DRAPE ORTHO SPLIT 77X108 STRL (DRAPES) ×2
DRAPE SURG ORHT 6 SPLT 77X108 (DRAPES) ×1 IMPLANT
DRESSING OPSITE X SMALL 2X3 (GAUZE/BANDAGES/DRESSINGS) ×2 IMPLANT
DURAPREP 26ML APPLICATOR (WOUND CARE) ×2 IMPLANT
ELECT REM PT RETURN 9FT ADLT (ELECTROSURGICAL) ×2
ELECTRODE REM PT RTRN 9FT ADLT (ELECTROSURGICAL) ×1 IMPLANT
GAUZE SPONGE 4X4 12PLY STRL (GAUZE/BANDAGES/DRESSINGS) ×2 IMPLANT
GAUZE SPONGE 4X4 12PLY STRL LF (GAUZE/BANDAGES/DRESSINGS) ×1 IMPLANT
GAUZE XEROFORM 1X8 LF (GAUZE/BANDAGES/DRESSINGS) ×2 IMPLANT
GLOVE BIO SURGEON STRL SZ8 (GLOVE) ×2 IMPLANT
GLOVE BIOGEL PI IND STRL 8 (GLOVE) ×2 IMPLANT
GLOVE BIOGEL PI INDICATOR 8 (GLOVE) ×2
GLOVE ORTHO TXT STRL SZ7.5 (GLOVE) ×2 IMPLANT
GOWN STRL REUS W/ TWL LRG LVL3 (GOWN DISPOSABLE) ×1 IMPLANT
GOWN STRL REUS W/ TWL XL LVL3 (GOWN DISPOSABLE) ×4 IMPLANT
GOWN STRL REUS W/TWL LRG LVL3 (GOWN DISPOSABLE) ×2
GOWN STRL REUS W/TWL XL LVL3 (GOWN DISPOSABLE) ×8
GUIDEROD T2 3X1000 (ROD) ×1 IMPLANT
GUIDEWIRE GAMMA (WIRE) ×2 IMPLANT
K-WIRE FIXATION 3X285 COATED (WIRE) ×4
KIT BASIN OR (CUSTOM PROCEDURE TRAY) ×2 IMPLANT
KIT ROOM TURNOVER OR (KITS) ×2 IMPLANT
KWIRE FIXATION 3X285 COATED (WIRE) IMPLANT
MANIFOLD NEPTUNE II (INSTRUMENTS) ×2 IMPLANT
NAIL ELAS INSERT SLV SPI 8-11 (MISCELLANEOUS) ×1 IMPLANT
NAIL TIBIAL STD 11X360MM (Nail) ×1 IMPLANT
NDL HYPO 21X1.5 SAFETY (NEEDLE) ×1 IMPLANT
NEEDLE HYPO 21X1.5 SAFETY (NEEDLE) ×2 IMPLANT
NS IRRIG 1000ML POUR BTL (IV SOLUTION) ×2 IMPLANT
PACK ORTHO EXTREMITY (CUSTOM PROCEDURE TRAY) ×2 IMPLANT
PACK UNIVERSAL I (CUSTOM PROCEDURE TRAY) ×2 IMPLANT
PAD ABD 8X10 STRL (GAUZE/BANDAGES/DRESSINGS) ×2 IMPLANT
PAD ARMBOARD 7.5X6 YLW CONV (MISCELLANEOUS) ×4 IMPLANT
PAD CAST 4YDX4 CTTN HI CHSV (CAST SUPPLIES) ×1 IMPLANT
PADDING CAST COTTON 4X4 STRL (CAST SUPPLIES) ×2
PADDING CAST COTTON 6X4 STRL (CAST SUPPLIES) ×1 IMPLANT
REAMER INTRAMEDULLARY 8MM 510 (MISCELLANEOUS) ×1 IMPLANT
SCREW GAMMA (Screw) ×1 IMPLANT
SCREW LOCKING T2 F/T  5MMX40MM (Screw) ×1 IMPLANT
SCREW LOCKING T2 F/T  5MMX45MM (Screw) ×1 IMPLANT
SCREW LOCKING T2 F/T  5MMX50MM (Screw) ×2 IMPLANT
SCREW LOCKING T2 F/T 5MMX40MM (Screw) IMPLANT
SCREW LOCKING T2 F/T 5MMX45MM (Screw) IMPLANT
SCREW LOCKING T2 F/T 5MMX50MM (Screw) IMPLANT
SPONGE LAP 18X18 X RAY DECT (DISPOSABLE) ×2 IMPLANT
SPONGE LAP 4X18 X RAY DECT (DISPOSABLE) ×2 IMPLANT
STAPLER VISISTAT 35W (STAPLE) ×2 IMPLANT
STOCKINETTE IMPERVIOUS LG (DRAPES) ×2 IMPLANT
SUT VIC AB 0 CTB1 27 (SUTURE) ×2 IMPLANT
SUT VIC AB 2-0 CT1 27 (SUTURE) ×2
SUT VIC AB 2-0 CT1 TAPERPNT 27 (SUTURE) ×1 IMPLANT
SYR CONTROL 10ML LL (SYRINGE) ×2 IMPLANT
TOWEL OR 17X24 6PK STRL BLUE (TOWEL DISPOSABLE) ×2 IMPLANT
TOWEL OR 17X26 10 PK STRL BLUE (TOWEL DISPOSABLE) ×2 IMPLANT
TUBE CONNECTING 12X1/4 (SUCTIONS) ×2 IMPLANT
WATER STERILE IRR 1000ML POUR (IV SOLUTION) ×2 IMPLANT
YANKAUER SUCT BULB TIP NO VENT (SUCTIONS) ×2 IMPLANT

## 2016-11-18 NOTE — Brief Op Note (Signed)
11/16/2016 - 11/18/2016  11:39 AM  PATIENT:  [name] [name]  76 y.o. female  PRE-OPERATIVE DIAGNOSIS:  Left Tibia Fracture  POST-OPERATIVE DIAGNOSIS:  Left Tibia Fracture  PROCEDURE:  Procedure(s): INTRAMEDULLARY (IM) NAIL TIBIAL (Left)  SURGEON:  Surgeon(s) and Role:    * Mcarthur Rossetti, MD - Primary  PHYSICIAN ASSISTANT: Benita Stabile, PA-C  ANESTHESIA:   general  EBL:  Total I/O In: -  Out: 150 [Blood:150]  COUNTS:  YES  DICTATION: .Other Dictation: Dictation Number [id number]  PLAN OF CARE: Admit to inpatient   PATIENT DISPOSITION:  PACU - hemodynamically stable.   Delay start of Pharmacological VTE agent (>24hrs) due to surgical blood loss or risk of bleeding: no

## 2016-11-18 NOTE — Progress Notes (Signed)
[facility]                                                                              Patient Demographics  [name] [name], is a 76 y.o. female, DOB - July 13, 1941, IEP:[id number]  Admit date - 11/16/2016   Admitting Physician Vianne Bulls, MD  Outpatient Primary MD for the patient is Cathlean Cower, MD  Outpatient specialists:   LOS - 2  days    Chief Complaint  Patient presents with  . 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 Patient had missed her dialysis session on the day of admission and 2 days ago due to passing of her friend. X-ray of her left leg showed acute comminuted fracture off mid to distal left tibia. BMET showed BUN 95, potassium 6.9. EKG showed peaked T waves. Orthopedics and nephrology were consulted.   Assessment & Plan    Principal Problem:   Left tibia fracture  - Secondary to mechanical fall, x-rays showed comminuted fracture of mid-to-distal left tibia   - Orthopedic surgery consulted - Plan for or today, patient was noted to have hypotension this morning, examined at the bedside, asymptomatic - 500 mL bolus given to the patient, she also reported that she runs low BP all the time in 70s and 80s - Placed on midodrine as well.   Active problems  Hyperkalemia - Serum potassium is 6.9 on admission with peaked T-waves noted on EKG, secondary to ESRD with missed dialysis sessions nephrology was consulted and patient underwent urgent hemodialysis. - underwent repeat HD on 4/14.   ESRD  - Pt usually dialyzes TTS, but missed 2 sessions leading up to admission  - nephrology following, patient underwent HD on 4/13 and 4/14  History of hypotension -  BP modestly elevated in ED in setting of pain  - Patient  however reports that her BP runs lower at home and does not take Norvasc, which is discontinued. Currently BP low, asymptomatic.  - Started on midodrine 10 mg twice a day  COPD   - No wheezing or respiratory complaints on admission  - Continue scheduled Dulera and prn albuterol    Lung cancer, squamous cell - RUL SCC, followed by oncologist at [facility]  - She completed chemoradiation and is now undergoing imaging surveillance q14mo  - Per onc notes from Feb, there is no evidence for recurrence and her chronic dry cough is likely secondary to treatment and her new baseline    GERD - EGD from 2016 with pangastritis and mild duodenitis, small hiatal hernia, no esophagitis  - Managed with daily PPI at home, will continue   Hypothyroidism  - Appears stable, continue Synthroid    Code Status: Full CODE STATUS DVT Prophylaxis:  Heparin subcutaneous Family Communication: Discussed in detail with the patient, all imaging results, lab results explained to the patient    Disposition Plan:   Time Spent in minutes  25 minutes  Procedures:  HD  Consultants:   Orthopedics Nephrology  Antimicrobials:      Medications  Scheduled Meds: . [MAR Hold] allopurinol  100 mg Oral Daily  . [MAR Hold] atorvastatin  40 mg Oral Daily  . [MAR Hold] Chlorhexidine Gluconate Cloth  6 each Topical Daily  . [MAR Hold] cholecalciferol  5,000 Units Oral Daily  . [MAR Hold] cinacalcet  30 mg Oral Q supper  . [MAR Hold] dextromethorphan-guaiFENesin  1 tablet Oral Daily  . [MAR Hold] DULoxetine  60 mg Oral Daily  . [MAR Hold] feeding supplement (NEPRO CARB STEADY)  237 mL Oral BID BM  . [MAR Hold] heparin  5,000 Units Subcutaneous Q8H  . [MAR Hold] hydrOXYzine  20 mg Oral Daily  . [MAR Hold] levothyroxine  175 mcg Oral QAC breakfast  . [MAR Hold] midodrine  10 mg Oral BID WC  . [MAR Hold] mometasone-formoterol  2 puff Inhalation BID  . [MAR Hold] montelukast  10 mg Oral QHS  . [MAR Hold]  multivitamin  1 tablet Oral QHS  . [MAR Hold] mupirocin ointment  1 application Nasal BID  . [MAR Hold] pantoprazole  40 mg Oral Daily  . povidone-iodine  2 application Topical Once  . [MAR Hold] pregabalin  50 mg Oral QHS  . [MAR Hold] QUEtiapine  100 mg Oral QHS  . [MAR Hold] sevelamer carbonate  3,200 mg Oral TID WC   Continuous Infusions: PRN Meds:.[MAR Hold] albuterol, [MAR Hold] clonazePAM, [MAR Hold] cyclobenzaprine, [MAR Hold] hydrALAZINE, [MAR Hold] HYDROcodone-acetaminophen, HYDROmorphone (DILAUDID) injection, [MAR Hold] lip balm, meperidine (DEMEROL) injection, [MAR Hold]  morphine injection, [MAR Hold] ondansetron (ZOFRAN) IV, ondansetron (ZOFRAN) IV, [MAR Hold] oxyCODONE, [MAR Hold] polyethylene glycol, [MAR Hold] sevelamer carbonate   Antibiotics   Anti-infectives    Start     Dose/Rate Route Frequency Ordered Stop   11/18/16 1002  clindamycin (CLEOCIN) 900 MG/50ML IVPB    Comments:  [name] [name]   : cabinet override      11/18/16 1002 11/18/16 1015   11/17/16 0800  clindamycin (CLEOCIN) IVPB 900 mg     900 mg 100 mL/hr over 30 Minutes Intravenous To Short Stay 11/17/16 0204 11/18/16 1030        Subjective:   [name] [name] was seen and examined today. No complaints, patient seen at the bedside earlier this morning prior to the surgery. Asymptomatic even though BP in high 70s. No dizziness, lightheadedness or any chest pain. IV bolus started. Pain in the left leg controlled. No nausea or vomiting. Patient denies  abdominal pain, D/C, new weakness, numbess, tingling.  Objective:   Vitals:   11/18/16 0005 11/18/16 0621 11/18/16 0628 11/18/16 0821  BP: (!) 94/40 (!) 77/32  (!) 83/40  Pulse:  98  98  Resp:    15  Temp:  99.4 F (37.4 C)  99 F (37.2 C)  TempSrc:  Oral  Oral  SpO2:   94% 94%  Weight:        Intake/Output Summary (Last 24 hours) at 11/18/16 1228 Last data filed at 11/18/16 1142  Gross per 24 hour  Intake              820 ml  Output              -100 ml  Net              920 ml     Wt Readings from Last 3 Encounters:  11/17/16 74 kg (163 lb  2.3 oz)  10/22/16 67.1 kg (148 lb)  11/21/15 71.3 kg (157 lb 1.6 oz)     Exam  General: Alert and oriented x 3, NAD  HEENT:    Neck: Supple, no JVD  Cardiovascular: S1 S2 auscultated, no rubs, murmurs or gallops. Regular rate and rhythm.  Respiratory: Clear to auscultation bilaterally, no wheezing, rales or rhonchi  Gastrointestinal: Soft, nontender, nondistended, + bowel sounds  Ext: no cyanosis clubbing, Left leg in splint  Neuro: no new deficits  Skin: No rashes  Psych: Normal affect and demeanor, alert and oriented x3    Data Reviewed:  I have personally reviewed following labs and imaging studies  Micro Results Recent Results (from the past 240 hour(s))  Surgical pcr screen     Status: Abnormal   Collection Time: 11/17/16  2:39 AM  Result Value Ref Range Status   MRSA, PCR NEGATIVE NEGATIVE Final   Staphylococcus aureus POSITIVE (A) NEGATIVE Final    Comment:        The Xpert SA Assay (FDA approved for NASAL specimens in patients over 75 years of age), is one component of a comprehensive surveillance program.  Test performance has been validated by [facility] for patients greater than or equal to 39 year old. It is not intended to diagnose infection nor to guide or monitor treatment.     Radiology Reports Dg Chest 2 View  Result Date: 11/16/2016 CLINICAL DATA:  Initial evaluation for acute trauma, fall. EXAM: CHEST  2 VIEW COMPARISON:  Prior radiograph from 05/29/2016. FINDINGS: Cardiac and mediastinal silhouettes are stable in size and contour, and remain within normal limits. Aortic atherosclerosis. Lungs mildly hypoinflated. Persistent irregular and linear opacity at the right lung apex, similar to previous. Finding suspected to largely reflect post treatment changes. Residual malignancy not excluded. Similarly, superimposed infection not excluded  either. No other focal airspace disease. Mild scarring at the left lung base. No pulmonary edema or pleural effusion. No pneumothorax. No acute osseus abnormality.  Scoliosis. IMPRESSION: 1. Persistent right upper lobe airspace disease. Findings suspected to largely reflect post treatment changes. Residual malignancy or superimposed infection not excluded. 2. Left basilar scarring. 3. Aortic atherosclerosis. Electronically Signed   By: Jeannine Boga M.D.   On: 11/16/2016 16:49   Dg Tibia/fibula Left  Result Date: 11/16/2016 CLINICAL DATA:  Initial evaluation for acute trauma, fall. EXAM: LEFT TIBIA AND FIBULA - 2 VIEW COMPARISON:  None. FINDINGS: Acute comminuted fracture of the mid-distal shaft of the left tibia. Slight lateral no definite fibular fracture. Overlying soft tissue swelling at the mid leg. Displacement of the main butterfly fragment. IMPRESSION: Acute comminuted fracture of the mid-distal left tibia with overlying soft tissue swelling. Electronically Signed   By: Jeannine Boga M.D.   On: 11/16/2016 16:46   Xr Knee 1-2 Views Left  Result Date: 10/23/2016 Two-view radiographs of the left knee shows bone-on-bone contact lateral joint line there is subchondral cyst with sclerosis along the joint line. There are periarticular cyst with essentially bone-on-bone contact lateral joint line.   Lab Data:  CBC:  Recent Labs Lab 11/16/16 1554 11/16/16 1814 11/18/16 0731  WBC 14.0*  --  13.1*  NEUTROABS 10.9*  --   --   HGB 13.1 13.6 10.5*  HCT 38.9 40.0 33.4*  MCV 86.3  --  89.1  PLT 349  --  008   Basic Metabolic Panel:  Recent Labs Lab 11/16/16 1814 11/17/16 0117 11/18/16 0302  NA 130* 136 132*  K 6.9* 5.1  5.1  CL 101 91* 93*  CO2  --  23 28  GLUCOSE 130* 110* 114*  BUN 95* 41* 19  CREATININE 16.20* 8.21* 5.00*  CALCIUM  --  8.8* 8.0*  PHOS  --   --  5.7*   GFR: Estimated Creatinine Clearance: 9.5 mL/min (A) (by C-G formula based on SCr of 5 mg/dL  (H)). Liver Function Tests:  Recent Labs Lab 11/18/16 0302  ALBUMIN 2.3*   No results for input(s): LIPASE, AMYLASE in the last 168 hours. No results for input(s): AMMONIA in the last 168 hours. Coagulation Profile:  Recent Labs Lab 11/17/16 0117  INR 1.47   Cardiac Enzymes: No results for input(s): CKTOTAL, CKMB, CKMBINDEX, TROPONINI in the last 168 hours. BNP (last 3 results) No results for input(s): PROBNP in the last 8760 hours. HbA1C: No results for input(s): HGBA1C in the last 72 hours. CBG:  Recent Labs Lab 11/17/16 0147 11/17/16 0650 11/17/16 1131 11/18/16 0817  GLUCAP 107* 100* 86 89   Lipid Profile: No results for input(s): CHOL, HDL, LDLCALC, TRIG, CHOLHDL, LDLDIRECT in the last 72 hours. Thyroid Function Tests: No results for input(s): TSH, T4TOTAL, FREET4, T3FREE, THYROIDAB in the last 72 hours. Anemia Panel: No results for input(s): VITAMINB12, FOLATE, FERRITIN, TIBC, IRON, RETICCTPCT in the last 72 hours. Urine analysis:    Component Value Date/Time   COLORURINE BROWN (A) 08/04/2014 0254   APPEARANCEUR TURBID (A) 08/04/2014 0254   LABSPEC 1.017 08/04/2014 0254   PHURINE 6.0 08/04/2014 0254   GLUCOSEU NEGATIVE 08/04/2014 0254   GLUCOSEU NEGATIVE 10/07/2012 1421   HGBUR LARGE (A) 08/04/2014 0254   BILIRUBINUR SMALL (A) 08/04/2014 0254   KETONESUR 15 (A) 08/04/2014 0254   PROTEINUR >300 (A) 08/04/2014 0254   UROBILINOGEN 0.2 08/04/2014 0254   NITRITE NEGATIVE 08/04/2014 0254   LEUKOCYTESUR LARGE (A) 08/04/2014 0254     Ripudeep Rai M.D. [facility] 11/18/2016, 12:28 PM  Pager: [phone] Between 7am to 7pm - call Pager - [phone]  After 7pm go to [email or web address] - password [email or web address]  Call night coverage person covering after 7pm

## 2016-11-18 NOTE — Anesthesia Procedure Notes (Signed)
Procedure Name: Intubation Date/Time: 11/18/2016 10:18 AM Performed by: [name] Pre-anesthesia Checklist: Patient identified, Emergency Drugs available and Suction available Patient Re-evaluated:Patient Re-evaluated prior to inductionOxygen Delivery Method: Circle system utilized Preoxygenation: Pre-oxygenation with 100% oxygen Intubation Type: IV induction Ventilation: Mask ventilation without difficulty Laryngoscope Size: 3 Grade View: Grade I Tube type: Oral Tube size: 7.0 mm Number of attempts: 1 Airway Equipment and Method: Stylet Placement Confirmation: ETT inserted through vocal cords under direct vision,  positive ETCO2 and breath sounds checked- equal and bilateral

## 2016-11-18 NOTE — Progress Notes (Signed)
Orthopedic Tech Progress Note Patient Details:  [name] [name] Oct 05, 1940 [id number]  Ortho Devices Type of Ortho Device: CAM walker Ortho Device/Splint Location: lle Ortho Device/Splint Interventions: Application   [name] 11/18/2016, 2:59 PM

## 2016-11-18 NOTE — Anesthesia Preprocedure Evaluation (Signed)
Anesthesia Evaluation  Patient identified by MRN, date of birth, ID band Patient awake    Reviewed: Allergy & Precautions, NPO status , Patient's Chart, lab work & pertinent test results  Airway Mallampati: I  TM Distance: >3 FB Neck ROM: Full    Dental   Pulmonary COPD, former smoker,  H/O lung CA   Pulmonary exam normal        Cardiovascular hypertension, Pt. on medications Normal cardiovascular exam     Neuro/Psych Seizures -,  Anxiety Depression    GI/Hepatic GERD  Medicated and Controlled,  Endo/Other  diabetes, Type 2  Renal/GU ESRF and DialysisRenal disease     Musculoskeletal   Abdominal   Peds  Hematology   Anesthesia Other Findings   Reproductive/Obstetrics                             Anesthesia Physical Anesthesia Plan  ASA: III  Anesthesia Plan: General   Post-op Pain Management:    Induction: Intravenous  Airway Management Planned: Oral ETT  Additional Equipment:   Intra-op Plan:   Post-operative Plan: Extubation in OR  Informed Consent: I have reviewed the patients History and Physical, chart, labs and discussed the procedure including the risks, benefits and alternatives for the proposed anesthesia with the patient or authorized representative who has indicated his/her understanding and acceptance.     Plan Discussed with: CRNA and Surgeon  Anesthesia Plan Comments:         Anesthesia Quick Evaluation

## 2016-11-18 NOTE — Progress Notes (Signed)
PT Cancellation Note  Patient Details Name: [name] [name] MRN: [id number] DOB: [birth year]   Cancelled Treatment:    Reason Eval/Treat Not Completed: Patient not medically ready.  Order received and chart reviewed.  Patient to OR today.  Will return tomorrow for PT evaluation.   Despina Pole 11/18/2016, 5:05 PM Carita Pian. Sanjuana Kava, [place] Pager [phone]

## 2016-11-18 NOTE — Transfer of Care (Signed)
Immediate Anesthesia Transfer of Care Note  Patient: [name] [name]  Procedure(s) Performed: Procedure(s): INTRAMEDULLARY (IM) NAIL TIBIAL (Left)  Patient Location: PACU  Anesthesia Type:General  Level of Consciousness: awake, alert  and oriented  Airway & Oxygen Therapy: Patient Spontanous Breathing and Patient connected to face mask oxygen  Post-op Assessment: Report given to RN, Post -op Vital signs reviewed and stable, Patient moving all extremities and Patient moving all extremities X 4  Post vital signs: Reviewed and stable  Last Vitals:  Vitals:   11/18/16 0621 11/18/16 0821  BP: (!) 77/32 (!) 83/40  Pulse: 98 98  Resp:  15  Temp: 37.4 C 37.2 C    Last Pain:  Vitals:   11/18/16 0821  TempSrc: Oral  PainSc:       Patients Stated Pain Goal: 0 (85/63/14 9702)  Complications: No apparent anesthesia complications

## 2016-11-18 NOTE — Plan of Care (Signed)
The nurse notify the patient's blood pressure is in the 21I systolic with heart rate of 100/m. Patient is asymptomatic. Patient is about to go for surgery. Noted patient's vital signs yesterday. I discussed with on-call nephrologist Dr. Florene Glen. Dr. Florene Glen advised to give 500cc normal saline bolus. I have also ordered stat CBC and lactate level. Based on the response of the fluid and CBC and lactate level further decision to be taken for surgery later. Will discuss with my oncoming colleague.  Gean Birchwood

## 2016-11-18 NOTE — Anesthesia Postprocedure Evaluation (Signed)
Anesthesia Post Note  Patient: Robina L Soulliere  Procedure(s) Performed: Procedure(s) (LRB): INTRAMEDULLARY (IM) NAIL TIBIAL (Left)  Patient location during evaluation: PACU Anesthesia Type: General Level of consciousness: awake and alert Pain management: pain level controlled Vital Signs Assessment: post-procedure vital signs reviewed and stable Respiratory status: spontaneous breathing, nonlabored ventilation, respiratory function stable and patient connected to nasal cannula oxygen Cardiovascular status: blood pressure returned to baseline and stable Postop Assessment: no signs of nausea or vomiting Anesthetic complications: no       Last Vitals:  Vitals:   11/18/16 0621 11/18/16 0821  BP: (!) 77/32 (!) 83/40  Pulse: 98 98  Resp:  15  Temp: 37.4 C 37.2 C    Last Pain:  Vitals:   11/18/16 0821  TempSrc: Oral  PainSc:                  Roran Wegner DAVID

## 2016-11-18 NOTE — Progress Notes (Signed)
[facility] Progress Note   Dialysis Orders:  TTS  [facility] 3.75 hrs 400/800 69.5 kg AVF Right BVT AVF 2.0 K/ 2.5 Ca linear Na -Heparin 1200 -Hectoral 6 mcg  (Last PTH 693 Last Ca 8.8 C Ca 9.0 11/01/2016) -Venofer 50 mg IV q weekly (last dose 11/10/2016 last Fe 87 Tsat 40 11/01/2016). No ESA- lat Mircera was 100 on 2/19   Assessment/Plan: 1. Left fx tibia related to a fall- s/p surgery Sunday - hold heparin at next HD 2. ESRD - TTS hyperkalemia 6.9 upon admission (missed two treatments prior to admission) down to 5.1 Sat and again today is 5.1  after dialysis yesterday- her last AF in April was very low at 148 and never repeated- it is not known if this was an error or not;  Last intervention was 10/22/16 by Dr. Scot Dock when she had a PTA of 34% basilic vein stenosis;  Repeat labs in am may need to run Monday if K up and evaluation if she needs another f'gram 3. Anemia - hgb 13 upon admission down to 10.5 this am - resume ESA Tuesday with HD - start at 60 Aranesp, resume weekly Fe 4. Secondary hyperparathyroidism - resume Hectorol; on sensipar and renvela 5. Hypotension/volume  Net UF 2 L on Friday and + 250cc on HD Sat due to low BP thought due to multiple pain meds and muscle relaxer- post HD wt 75 that is not congruent with post wt of HD on Friday which was 67.1 6. Nutrition- renal diet/vit/supplements 7. Hx right [place] lung cancer s/p chemo - at [facility], [place] [phone] 11/18/2016,12:44 PM  LOS: 2 days   Renal I agree with note above.  She has leg pain after the nailing. Tharon Kitch C  Subjective:   Sedated post op  Objective Vitals:   11/18/16 0005 11/18/16 0621 11/18/16 0628 11/18/16 0821  BP: (!) 94/40 (!) 77/32  (!) 83/40  Pulse:  98  98  Resp:    15  Temp:  99.4 F (37.4 C)  99 F (37.2 C)  TempSrc:  Oral  Oral  SpO2:   94% 94%  Weight:       Physical Exam General: frail elderly AAF very drowsy - softly  moaning - s/p pain meds Heart: RRR Lungs: no rales anteriorly  Abdomen: soft Extremities: LLE wrapped right no sig edema Dialysis Access:  Right upper AVF + bruit   Additional Objective Labs: Basic Metabolic Panel:  Recent Labs Lab 11/16/16 1814 11/17/16 0117 11/18/16 0302  NA 130* 136 132*  K 6.9* 5.1 5.1  CL 101 91* 93*  CO2  --  23 28  GLUCOSE 130* 110* 114*  BUN 95* 41* 19  CREATININE 16.20* 8.21* 5.00*  CALCIUM  --  8.8* 8.0*  PHOS  --   --  5.7*   Liver Function Tests:  Recent Labs Lab 11/18/16 0302  ALBUMIN 2.3*   No results for input(s): LIPASE, AMYLASE in the last 168 hours. CBC:  Recent Labs Lab 11/16/16 1554 11/16/16 1814 11/18/16 0731  WBC 14.0*  --  13.1*  NEUTROABS 10.9*  --   --   HGB 13.1 13.6 10.5*  HCT 38.9 40.0 33.4*  MCV 86.3  --  89.1  PLT 349  --  229   Blood Culture    Component Value Date/Time   SDES BRONCHIAL ALVEOLAR LAVAGE 10/25/2015 0830   SPECREQUEST NONE 10/25/2015 0830   CULT  10/25/2015 0830  YEAST CONSISTENT WITH CANDIDA SPECIES Performed at [place] 10/27/2015 FINAL 10/25/2015 0830    Cardiac Enzymes: No results for input(s): CKTOTAL, CKMB, CKMBINDEX, TROPONINI in the last 168 hours. CBG:  Recent Labs Lab 11/17/16 0147 11/17/16 0650 11/17/16 1131 11/18/16 0817 11/18/16 1243  GLUCAP 107* 100* 86 89 77   Iron Studies: No results for input(s): IRON, TIBC, TRANSFERRIN, FERRITIN in the last 72 hours. Lab Results  Component Value Date   INR 1.47 11/17/2016   INR 1.19 08/03/2014   INR 1.09 02/23/2011   Studies/Results: Dg Chest 2 View  Result Date: 11/16/2016 CLINICAL DATA:  Initial evaluation for acute trauma, fall. EXAM: CHEST  2 VIEW COMPARISON:  Prior radiograph from 05/29/2016. FINDINGS: Cardiac and mediastinal silhouettes are stable in size and contour, and remain within normal limits. Aortic atherosclerosis. Lungs mildly hypoinflated. Persistent irregular and linear  opacity at the right lung apex, similar to previous. Finding suspected to largely reflect post treatment changes. Residual malignancy not excluded. Similarly, superimposed infection not excluded either. No other focal airspace disease. Mild scarring at the left lung base. No pulmonary edema or pleural effusion. No pneumothorax. No acute osseus abnormality.  Scoliosis. IMPRESSION: 1. Persistent right upper lobe airspace disease. Findings suspected to largely reflect post treatment changes. Residual malignancy or superimposed infection not excluded. 2. Left basilar scarring. 3. Aortic atherosclerosis. Electronically Signed   By: Jeannine Boga M.D.   On: 11/16/2016 16:49   Dg Tibia/fibula Left  Result Date: 11/16/2016 CLINICAL DATA:  Initial evaluation for acute trauma, fall. EXAM: LEFT TIBIA AND FIBULA - 2 VIEW COMPARISON:  None. FINDINGS: Acute comminuted fracture of the mid-distal shaft of the left tibia. Slight lateral no definite fibular fracture. Overlying soft tissue swelling at the mid leg. Displacement of the main butterfly fragment. IMPRESSION: Acute comminuted fracture of the mid-distal left tibia with overlying soft tissue swelling. Electronically Signed   By: Jeannine Boga M.D.   On: 11/16/2016 16:46   Medications:  . [MAR Hold] allopurinol  100 mg Oral Daily  . [MAR Hold] atorvastatin  40 mg Oral Daily  . [MAR Hold] Chlorhexidine Gluconate Cloth  6 each Topical Daily  . [MAR Hold] cholecalciferol  5,000 Units Oral Daily  . [MAR Hold] cinacalcet  30 mg Oral Q supper  . [MAR Hold] dextromethorphan-guaiFENesin  1 tablet Oral Daily  . [MAR Hold] DULoxetine  60 mg Oral Daily  . [MAR Hold] feeding supplement (NEPRO CARB STEADY)  237 mL Oral BID BM  . [MAR Hold] heparin  5,000 Units Subcutaneous Q8H  . [MAR Hold] hydrOXYzine  20 mg Oral Daily  . [MAR Hold] levothyroxine  175 mcg Oral QAC breakfast  . [MAR Hold] midodrine  10 mg Oral BID WC  . [MAR Hold] mometasone-formoterol  2  puff Inhalation BID  . [MAR Hold] montelukast  10 mg Oral QHS  . [MAR Hold] multivitamin  1 tablet Oral QHS  . [MAR Hold] mupirocin ointment  1 application Nasal BID  . [MAR Hold] pantoprazole  40 mg Oral Daily  . povidone-iodine  2 application Topical Once  . [MAR Hold] pregabalin  50 mg Oral QHS  . [MAR Hold] QUEtiapine  100 mg Oral QHS  . [MAR Hold] sevelamer carbonate  3,200 mg Oral TID WC

## 2016-11-18 NOTE — Progress Notes (Signed)
11/18/2016 Patient came from [place] to 4E22. Patient was drowsy, but able to arouse. She fell at home and had a Left Tibia Fracture. She had a Left Intramedullary Nail Tibial. Patient dressing clean and dry. 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  She have a fistula on right upper arm, which is positive .She receive a CHG bath and place on telemetry. [facility] RN.

## 2016-11-19 DIAGNOSIS — T148XXA Other injury of unspecified body region, initial encounter: Secondary | ICD-10-CM

## 2016-11-19 LAB — CBC
HCT: 34.6 % — ABNORMAL LOW (ref 36.0–46.0)
Hemoglobin: 11.2 g/dL — ABNORMAL LOW (ref 12.0–15.0)
MCH: 28.8 pg (ref 26.0–34.0)
MCHC: 32.4 g/dL (ref 30.0–36.0)
MCV: 88.9 fL (ref 78.0–100.0)
Platelets: 195 10*3/uL (ref 150–400)
RBC: 3.89 MIL/uL (ref 3.87–5.11)
RDW: 18.3 % — ABNORMAL HIGH (ref 11.5–15.5)
WBC: 11.3 10*3/uL — AB (ref 4.0–10.5)

## 2016-11-19 LAB — BASIC METABOLIC PANEL
ANION GAP: 13 (ref 5–15)
BUN: 29 mg/dL — ABNORMAL HIGH (ref 6–20)
CO2: 25 mmol/L (ref 22–32)
Calcium: 7.7 mg/dL — ABNORMAL LOW (ref 8.9–10.3)
Chloride: 95 mmol/L — ABNORMAL LOW (ref 101–111)
Creatinine, Ser: 6.78 mg/dL — ABNORMAL HIGH (ref 0.44–1.00)
GFR, EST AFRICAN AMERICAN: 6 mL/min — AB (ref >60)
GFR, EST NON AFRICAN AMERICAN: 5 mL/min — AB (ref >60)
Glucose, Bld: 92 mg/dL (ref 65–99)
Potassium: 5.8 mmol/L — ABNORMAL HIGH (ref 3.5–5.1)
Sodium: 133 mmol/L — ABNORMAL LOW (ref 135–145)

## 2016-11-19 LAB — HEPATITIS B SURFACE ANTIGEN: Hepatitis B Surface Ag: NEGATIVE

## 2016-11-19 LAB — POCT I-STAT, CHEM 8
BUN: 95 mg/dL — ABNORMAL HIGH (ref 6–20)
CALCIUM ION: 0.76 mmol/L — AB (ref 1.15–1.40)
Chloride: 101 mmol/L (ref 101–111)
Creatinine, Ser: 16.2 mg/dL — ABNORMAL HIGH (ref 0.44–1.00)
GLUCOSE: 130 mg/dL — AB (ref 65–99)
HCT: 40 % (ref 36.0–46.0)
HEMOGLOBIN: 13.6 g/dL (ref 12.0–15.0)
SODIUM: 130 mmol/L — AB (ref 135–145)
TCO2: 20 mmol/L (ref 0–100)

## 2016-11-19 MED ORDER — ORAL CARE MOUTH RINSE
15.0000 mL | Freq: Two times a day (BID) | OROMUCOSAL | Status: DC
  Administered 2016-11-19 – 2016-11-23 (×10): 15 mL via OROMUCOSAL

## 2016-11-19 MED ORDER — MORPHINE SULFATE (PF) 2 MG/ML IV SOLN
2.0000 mg | INTRAVENOUS | Status: DC | PRN
  Administered 2016-11-20: 2 mg via INTRAVENOUS
  Filled 2016-11-19: qty 1

## 2016-11-19 NOTE — Op Note (Signed)
NAME:  [name] [name], [name] [name]                      ACCOUNT NO.:  MEDICAL RECORD NO.:  [id number]  LOCATION:                                 FACILITY:  PHYSICIAN:  Lind Guest. Ninfa Linden, M.D.DATE OF BIRTH:  [birth year]  DATE OF PROCEDURE:  11/18/2016 DATE OF DISCHARGE:                              OPERATIVE REPORT   PREOPERATIVE DIAGNOSIS:  Comminuted left tibia/fibula fracture, shaft fracture.  POSTOPERATIVE DIAGNOSIS:  Comminuted left tibia/fibula fracture, shaft fracture.  PROCEDURE:  Intramedullary nail placement, left tibia.  IMPLANTS:  Stryker suprapatellar tibial nail measuring 11 mm x 360 mm with 2 proximal and 2 distal interlocking screws.  SURGEON:  Lind Guest. Ninfa Linden, M.D.  ASSISTANT:  Erskine Emery, PA-C.  ANESTHESIA:  General.  BLOOD LOSS:  Less than 150 mL.  COMPLICATIONS:  None.  INDICATIONS:  Ms. Westendorf is a frail 76 year old female with end-stage renal disease and multiple other medical problems, on dialysis.  She has renal osteopenic bone.  She sustained a mechanical fall on Friday and was found to have a comminuted tibial and fibular fracture.  She was placed in a splint and was admitted to the hospital.  She has had 2 rounds of dialysis since then on Friday and Saturday because she had missed dialysis.  She now presents for stabilization of her tibia fracture.  A long and thorough discussion, risks and benefits of surgery was had with her and her family.  PROCEDURE DESCRIPTION:  After informed consent was obtained, appropriate left leg was marked.  She was brought to the operating room, placed supine on the flat Jackson radiolucent table.  General anesthesia was obtained.  Her left leg was prepped and draped from the thigh down the toes with DuraPrep and sterile drapes.  A time-out was called.  She was identified as correct patient and correct left leg.  We then made a suprapatellar approach to the tibia making an incision just proximal to the patella  and dissected down to the quadriceps tendon and splitting the quadriceps tendon.  Using then a soft tissue protection guide under direct visualization and fluoroscopy, we were able to place temporary guide pin.  We then used initiating reamer to open up the tibial canal and then placed a long guide rod going down the tibia across the fracture into the ankle portion of the distal tibia.  We then began reaming from a size 9 reamer in stepwise increments up to a size 12.  We then took a measurement off the guide rod and chose an 11 x 360 tibial nail which we placed over the temporary guide rod without difficulty. We removed then the guide rod.  We then placed 2 proximal interlocking screws and 2 distal interlocking screws off the small stab incisions. We then removed all instrumentation and closed our arthrotomy with a quad tendon with 0 Vicryl followed by _______ in the deep tissue, 2-0 Vicryl subcutaneous tissue, and interrupted staples on all skin incisions.  Xeroform and well-padded sterile dressing was applied.  She was awakened, extubated, and taken to recovery room in stable condition. All final counts were correct.  There were no complications noted. Postoperatively, we will need  to have her nonweightbearing due to her frail bone on that left lower extremity until further notice.  Of note, Erskine Emery, PAC, assisted in the entire case.  His assistance was crucial for facilitating all aspects of this case.     Lind Guest. Ninfa Linden, M.D.     CYB/MEDQ  D:  11/18/2016  T:  11/18/2016  Job:  [id number]

## 2016-11-19 NOTE — Progress Notes (Signed)
[facility] Progress Note   Subjective: no c/o  Vitals:   11/18/16 1546 11/18/16 1959 11/18/16 2353 11/19/16 0800  BP: (!) 111/52 (!) 144/66 (!) 118/51 (!) 127/50  Pulse: (!) 107 (!) 113  (!) 108  Resp: 14 (!) 23  20  Temp: 98.8 F (37.1 C) 97.5 F (36.4 C) 98.9 F (37.2 C) 99.9 F (37.7 C)  TempSrc: Oral Oral Oral Oral  SpO2: (!) 89% 97%  100%  Weight: 73.4 kg (161 lb 13.1 oz)     Height: '5\' 7"'$  (1.702 m)       Inpatient medications: . allopurinol  100 mg Oral Daily  . atorvastatin  40 mg Oral Daily  . cholecalciferol  5,000 Units Oral Daily  . cinacalcet  30 mg Oral Q supper  . dextromethorphan-guaiFENesin  1 tablet Oral Daily  . DULoxetine  60 mg Oral Daily  . feeding supplement (NEPRO CARB STEADY)  237 mL Oral BID BM  . heparin  5,000 Units Subcutaneous Q8H  . hydrOXYzine  20 mg Oral Daily  . levothyroxine  175 mcg Oral QAC breakfast  . mouth rinse  15 mL Mouth Rinse BID  . midodrine  10 mg Oral BID WC  . mometasone-formoterol  2 puff Inhalation BID  . montelukast  10 mg Oral QHS  . multivitamin  1 tablet Oral QHS  . mupirocin ointment  1 application Nasal BID  . pantoprazole  40 mg Oral Daily  . pregabalin  50 mg Oral QHS  . QUEtiapine  100 mg Oral QHS  . sevelamer carbonate  3,200 mg Oral TID WC    albuterol, clonazePAM, cyclobenzaprine, hydrALAZINE, HYDROcodone-acetaminophen, lip balm, morphine injection, ondansetron (ZOFRAN) IV, oxyCODONE, polyethylene glycol, sevelamer carbonate  Exam: Frail edlerly AAF, alert No jvd Chest clear bilat RRR no RG Abd soft ntnd LLE wrapped ace bandage, bilat old toe amps RUA AVF+bruit NF, ox 2  Dialysis: TTS [facility] 3h 76mn  69.5kg   R AVF   Hep 1200 - Hect 6 ug - last Mircera 2/19 - Venofer 50/wk      Assessment: 1. Tib/ fib fracture LLE - sp ORIF 4/15 2. ESRD TTS HD (last AF in April very low at 148 and never repeated- it is not known if this was an error or not, last intervention was 10/22/16 by  Dr. DScot Dockwhen she had a PTA of 700%basilic vein stenosis 3. Anemia / CKD - Hb 13  > 10 here, resuming ESA tues w HD at 60/wk 4. MBD/ CKD - cont hect, sensipar/ renvela 5. VOlume - +4kg today 6. Hx R lung [place] cancer - s/p chemo, [facility] pager [phone]  11/19/2016, 11:40 AM    Recent Labs Lab 11/17/16 0117 11/18/16 0302 11/19/16 0238  NA 136 132* 133*  K 5.1 5.1 5.8*  CL 91* 93* 95*  CO2 '23 28 25  '$ GLUCOSE 110* 114* 92  BUN 41* 19 29*  CREATININE 8.21* 5.00* 6.78*  CALCIUM 8.8* 8.0* 7.7*  PHOS  --  5.7*  --     Recent Labs Lab 11/18/16 0302  ALBUMIN 2.3*    Recent Labs Lab 11/16/16 1554 11/16/16 1814 11/18/16 0731 11/19/16 0238  WBC 14.0*  --  13.1* 11.3*  NEUTROABS 10.9*  --   --   --   HGB 13.1 13.6 10.5* 11.2*  HCT 38.9 40.0 33.4* 34.6*  MCV 86.3  --  89.1 88.9  PLT 349  --  229 195   Iron/TIBC/Ferritin/ %Sat    Component Value Date/Time   IRON 119 05/26/2015 1040   TIBC 220 (L) 05/26/2015 1040   FERRITIN 950 (H) 05/26/2015 1040   IRONPCTSAT 54 (H) 05/26/2015 1040

## 2016-11-19 NOTE — Care Management Note (Signed)
Case Management Note Marvetta Gibbons RN, BSN Unit 2W-Case Manager [phone] Covering 4E  Patient Details  Name: [name] [name] MRN: [id number] Date of Birth: 1940/09/08  Subjective/Objective: Pt admitted with Tibia fx- s/p IM nailing on 4/15                  Action/Plan: PTA pt lived at home- hx of ESRD- on HD-T/T/S-- per PT eval recommendation for SNF- CSW consulted for placement needs   Expected Discharge Date:                  Expected Discharge Plan:  Skilled Nursing Facility  In-House Referral:  Clinical Social Work  Discharge planning Services  CM Consult  Post Acute Care Choice:    Choice offered to:     DME Arranged:    DME Agency:     HH Arranged:    [place] Agency:     Status of Service:  In process, will continue to follow  If discussed at Long Length of Stay Meetings, dates discussed:    Discharge Disposition:   Additional Comments:  Dawayne Patricia, RN 11/19/2016, 10:20 AM

## 2016-11-19 NOTE — Plan of Care (Signed)
Problem: Pain Managment: Goal: General experience of comfort will improve Outcome: Progressing Discussed with patient about pain medication and as needed with some teach back displayed

## 2016-11-19 NOTE — Progress Notes (Signed)
Subjective: 1 Day Post-Op Procedure(s) (LRB): INTRAMEDULLARY (IM) NAIL TIBIAL (Left) Patient reports pain as moderate.  Tolerated surgery well yesterday.  Objective: Vital signs in last 24 hours: Temp:  [97.4 F (36.3 C)-99 F (37.2 C)] 98.9 F (37.2 C) (04/15 2353) Pulse Rate:  [98-113] 113 (04/15 1959) Resp:  [9-23] 23 (04/15 1959) BP: (83-144)/(40-66) 118/51 (04/15 2353) SpO2:  [87 %-100 %] 97 % (04/15 1959) Weight:  [161 lb 13.1 oz (73.4 kg)] 161 lb 13.1 oz (73.4 kg) (04/15 1546)  Intake/Output from previous day: 04/15 0701 - 04/16 0700 In: 900 [I.V.:700] Out: 150 [Blood:150] Intake/Output this shift: No intake/output data recorded.   Recent Labs  11/16/16 1554 11/16/16 1814 11/18/16 0731 11/19/16 0238  HGB 13.1 13.6 10.5* 11.2*    Recent Labs  11/18/16 0731 11/19/16 0238  WBC 13.1* 11.3*  RBC 3.75* 3.89  HCT 33.4* 34.6*  PLT 229 195    Recent Labs  11/18/16 0302 11/19/16 0238  NA 132* 133*  K 5.1 5.8*  CL 93* 95*  CO2 28 25  BUN 19 29*  CREATININE 5.00* 6.78*  GLUCOSE 114* 92  CALCIUM 8.0* 7.7*    Recent Labs  11/17/16 0117  INR 1.47    Intact pulses distally Dorsiflexion/Plantar flexion intact Incision: dressing C/D/I Compartment soft  Assessment/Plan: 1 Day Post-Op Procedure(s) (LRB): INTRAMEDULLARY (IM) NAIL TIBIAL (Left) Up with therapy - non-weight bearing left leg  [name] [name] 11/19/2016, 7:31 AM

## 2016-11-19 NOTE — [place] FL2 (Signed)
[place] MEDICAID FL2 LEVEL OF CARE SCREENING TOOL     IDENTIFICATION  Patient Name: [name] [name] Birthdate: [birth year] Sex: female Admission Date (Current Location): 11/16/2016  [facility] and [place] Number:  Herbalist and Address:  [facility], [place] [address] [place], [place] [address]      Provider Number: [id number]  Attending Physician Name and Address:  Mendel Corning, MD  Relative Name and Phone Number:       Current Level of Care: Hospital Recommended Level of Care: [place] Prior Approval Number:    Date Approved/Denied:   PASRR Number: [id number]  Discharge Plan: SNF    Current Diagnoses: Patient Active Problem List   Diagnosis Date Noted  . Tibia/fibula fracture, left, closed, initial encounter 11/16/2016  . Closed displaced comminuted fracture of shaft of left tibia   . Impingement syndrome of left shoulder 10/23/2016  . Chronic pain of left thumb 10/23/2016  . Squamous cell lung cancer ([place]) 10/26/2015  . Lung mass 10/22/2015  . Cough 06/15/2015  . Syncope 05/25/2015    Class: Acute  . Thrombocytopenia ([place]) 08/04/2014  . Acute encephalopathy 08/03/2014  . Non-compliant behavior 05/20/2014  . Pain of right upper arm 04/30/2014  . Other complications due to renal dialysis device, implant, and graft 04/30/2014  . Hyperparathyroidism, secondary renal ([place]) 04/12/2014  . Hyperkalemia 03/30/2014  . Hypoglycemia 03/30/2014  . ESRD on hemodialysis ([place]) 03/30/2014  . Anemia of renal disease 03/30/2014  . Peripheral neuropathy 12/12/2013  . Hives 02/09/2013  . Thinning of skin 02/04/2013  . 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 Unspecified constipation 01/02/2013  . Muscle spasm 01/02/2013  . COPD (chronic obstructive pulmonary disease) ([place]) 01/02/2013  . Cancer of kidney ([place]) 10/07/2012  . Insomnia 10/07/2012  . Dysuria 01/24/2012  . N&V (nausea and vomiting) 01/24/2012  . Generalized weakness  08/17/2011  . Gait instability 08/17/2011  . UTI (lower urinary tract infection) 08/17/2011  . Chronic pain of left knee 04/20/2011  . 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 Chronic neck pain 12/13/2010  . Osteopenia 12/13/2010  . Preventative health care 12/12/2010  . HEART MURMUR, HX OF 10/02/2010  . Palpitations 09/08/2010  . HYPERSOMNIA 05/26/2010  . Anxiety state 03/23/2010  . Memory loss 01/24/2010  . Backache 12/12/2009  . Personal history of colonic polyps 11/16/2009  . MENOPAUSAL DISORDER 05/25/2009  . SHOULDER PAIN, LEFT, CHRONIC 09/15/2008  . CERVICAL RADICULOPATHY, LEFT 05/27/2008  . Osteoarth NOS-L/Leg 03/11/2008  . 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 FATIGUE 09/17/2007  . Abnormality of gait 07/30/2007  . Gout 05/29/2007  . RESTLESS LEG SYNDROME 05/29/2007  . COMMON MIGRAINE 05/29/2007  . PERIPHERAL NEUROPATHY 05/29/2007  . Asthmatic bronchitis , chronic ([place]) 05/29/2007  . Peptic ulcer 05/29/2007  . Hypothyroidism 02/17/2007  . Impaired glucose tolerance 02/17/2007  . HLD (hyperlipidemia) 02/17/2007  . DEPRESSION 02/17/2007  . Essential hypertension 02/17/2007  . Peripheral vascular disease ([place]) 02/17/2007  . ALLERGIC RHINITIS 02/17/2007  . GERD 02/17/2007  . Seizure ([place]) 02/17/2007    Orientation RESPIRATION BLADDER Height & Weight     Self, Place, Situation  O2 (3L [place]) Continent Weight: 161 lb 13.1 oz (73.4 kg) Height:  '5\' 7"'$  (170.2 cm)  BEHAVIORAL SYMPTOMS/MOOD NEUROLOGICAL BOWEL NUTRITION STATUS      Continent Diet  AMBULATORY STATUS COMMUNICATION OF NEEDS Skin   Extensive Assist Verbally Normal  Personal Care Assistance Level of Assistance  Bathing, Dressing Bathing Assistance: Maximum assistance   Dressing Assistance: Maximum assistance     Functional Limitations Info             SPECIAL CARE FACTORS FREQUENCY  PT (By licensed PT), OT (By licensed OT)     PT Frequency: 5/wk OT Frequency: 5/wk             Contractures      Additional Factors Info  Code Status, Allergies, Psychotropic Code Status Info: FULL Allergies Info: Cephalosporins, Nsaids, Pioglitazone, Adhesive Tape, Vancomycin Psychotropic Info: seroquel         Current Medications (11/19/2016):  This is the current hospital active medication list Current Facility-Administered Medications  Medication Dose Route Frequency Provider Last Rate Last Dose  . albuterol (PROVENTIL) (2.5 MG/3ML) 0.083% nebulizer solution 2.5 mg  2.5 mg Inhalation Q6H PRN Vianne Bulls, MD      . allopurinol (ZYLOPRIM) tablet 100 mg  100 mg Oral Daily Vianne Bulls, MD   100 mg at 11/19/16 1043  . atorvastatin (LIPITOR) tablet 40 mg  40 mg Oral Daily Vianne Bulls, MD   40 mg at 11/19/16 1043  . cholecalciferol (VITAMIN D) tablet 5,000 Units  5,000 Units Oral Daily Vianne Bulls, MD   5,000 Units at 11/19/16 1024  . cinacalcet (SENSIPAR) tablet 30 mg  30 mg Oral Q supper Vianne Bulls, MD   30 mg at 11/18/16 2101  . clonazePAM (KLONOPIN) tablet 1 mg  1 mg Oral BID PRN Vianne Bulls, MD   1 mg at 11/17/16 0900  . cyclobenzaprine (FLEXERIL) tablet 10 mg  10 mg Oral TID PRN Vianne Bulls, MD   10 mg at 11/19/16 1022  . dextromethorphan-guaiFENesin (MUCINEX DM) 30-600 MG per 12 hr tablet 1 tablet  1 tablet Oral Daily Vianne Bulls, MD   1 tablet at 11/19/16 1044  . DULoxetine (CYMBALTA) DR capsule 60 mg  60 mg Oral Daily Vianne Bulls, MD   60 mg at 11/19/16 1043  . feeding supplement (NEPRO CARB STEADY) liquid 237 mL  237 mL Oral BID BM Ripudeep K Rai, MD   237 mL at 11/19/16 1052  . heparin injection 5,000 Units  5,000 Units Subcutaneous Q8H Ilene Qua Opyd, MD   5,000 Units at 11/19/16 0615  . hydrALAZINE (APRESOLINE) injection 5-10 mg  5-10 mg Intravenous Q4H PRN Vianne Bulls, MD      . HYDROcodone-acetaminophen (NORCO/VICODIN) 5-325 MG per tablet 1-2 tablet  1-2 tablet Oral Q6H PRN Vianne Bulls, MD   1 tablet at 11/19/16 1256  . hydrOXYzine  (ATARAX/VISTARIL) tablet 20 mg  20 mg Oral Daily Vianne Bulls, MD   20 mg at 11/19/16 1025  . levothyroxine (SYNTHROID, LEVOTHROID) tablet 175 mcg  175 mcg Oral QAC breakfast Vianne Bulls, MD   175 mcg at 11/19/16 0858  . lip balm (BLISTEX) ointment   Topical PRN Mcarthur Rossetti, MD      . MEDLINE mouth rinse  15 mL Mouth Rinse BID Ripudeep K Rai, MD   15 mL at 11/19/16 0104  . midodrine (PROAMATINE) tablet 10 mg  10 mg Oral BID WC Ripudeep Krystal Eaton, MD   10 mg at 11/19/16 0858  . mometasone-formoterol (DULERA) 100-5 MCG/ACT inhaler 2 puff  2 puff Inhalation BID Vianne Bulls, MD   2 puff at 11/19/16 1241  . montelukast (SINGULAIR) tablet 10 mg  10 mg  Oral QHS Vianne Bulls, MD   10 mg at 11/18/16 2049  . morphine 2 MG/ML injection 2 mg  2 mg Intravenous Q2H PRN Ripudeep K Rai, MD      . multivitamin (RENA-VIT) tablet 1 tablet  1 tablet Oral QHS Vianne Bulls, MD   1 tablet at 11/18/16 2049  . mupirocin ointment (BACTROBAN) 2 % 1 application  1 application Nasal BID Ripudeep Krystal Eaton, MD   1 application at 70/14/10 1053  . ondansetron (ZOFRAN) injection 4 mg  4 mg Intravenous Q6H PRN Vianne Bulls, MD      . oxyCODONE (Oxy IR/ROXICODONE) immediate release tablet 5 mg  5 mg Oral Q3H PRN Mcarthur Rossetti, MD   5 mg at 11/19/16 1256  . pantoprazole (PROTONIX) EC tablet 40 mg  40 mg Oral Daily Vianne Bulls, MD   40 mg at 11/19/16 1023  . polyethylene glycol (MIRALAX / GLYCOLAX) packet 17 g  17 g Oral Daily PRN Vianne Bulls, MD      . pregabalin (LYRICA) capsule 50 mg  50 mg Oral QHS Vianne Bulls, MD   50 mg at 11/18/16 2048  . QUEtiapine (SEROQUEL) tablet 100 mg  100 mg Oral QHS Vianne Bulls, MD   100 mg at 11/18/16 2049  . sevelamer carbonate (RENVELA) tablet 2,400 mg  2,400 mg Oral PRN Vianne Bulls, MD      . sevelamer carbonate (RENVELA) tablet 3,200 mg  3,200 mg Oral TID WC Vianne Bulls, MD   3,200 mg at 11/18/16 2103     Discharge Medications: Please see discharge  summary for a list of discharge medications.  Relevant Imaging Results:  Relevant Lab Results:   Additional Information SS#: [id number], dialysis at [facility]  [place], [place]

## 2016-11-19 NOTE — Progress Notes (Signed)
Orthopedic Tech Progress Note Patient Details:  [name] [name] [place] [birth year] [id number]  Ortho Devices Type of Ortho Device: CAM walker Ortho Device/Splint Location: Trapeze bar Ortho Device/Splint Interventions: Application   [name] [name] 11/19/2016, 5:16 PM

## 2016-11-19 NOTE — Evaluation (Signed)
Physical Therapy Evaluation Patient Details Name: [name] [name] MRN: [id number] DOB: [birth year] Today's Date: 11/19/2016   History of Present Illness  76 y.o. 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   Clinical Impression  Patient demonstrates deficits in functional mobility as indicated below. Will need continued skilled PT to address deficits and maximize function. Will see as indicated and progress as tolerated.   At this time, patient very limited with functional mobility due to pain and activity tolerance. Given current functional levels, WBing restrictions, fall risk, and co morbidities feel patient will need ST SNF upon acute discharge.    Follow Up Recommendations SNF;Supervision/Assistance - 24 hour    Equipment Recommendations  None recommended by PT    Recommendations for Other Services       Precautions / Restrictions Precautions Precautions: Fall Restrictions Weight Bearing Restrictions: Yes LLE Weight Bearing: Non weight bearing      Mobility  Bed Mobility Overal bed mobility: Needs Assistance Bed Mobility: Supine to Sit     Supine to sit: Mod assist     General bed mobility comments: VCs for positioning and technique, assist to move LE to EOB, assist to elevate trunk to upright position. Patient able to use bed rails to pull to EOB  Transfers Overall transfer level: Needs assistance Equipment used:  (face to face) Transfers: Squat Pivot Transfers     Squat pivot transfers: Max assist     General transfer comment: mas assist to squate pivot in 3 parts to move to drop arm chair  Ambulation/Gait             General Gait Details: unable to perform  Stairs            Wheelchair Mobility    Modified Rankin (Stroke Patients Only)       Balance Overall balance assessment: History of Falls                                           Pertinent Vitals/Pain Pain Assessment: Faces Faces Pain Scale: Hurts even more Pain Location: left LE Pain Descriptors / Indicators: Grimacing;Guarding;Moaning    Home Living Family/patient expects to be discharged to:: Skilled nursing facility Living Arrangements: Alone   Type of Home: House Home Access: Stairs to enter Entrance Stairs-Rails: Can reach both Entrance Stairs-Number of Steps: 3 Home Layout: Multi-level Home Equipment: Walker - 2 wheels;Bedside commode;Shower seat;Wheelchair - manual      Prior Function Level of Independence: Independent with assistive device(s)               Hand Dominance   Dominant Hand: Left    Extremity/Trunk Assessment   Upper Extremity Assessment Upper Extremity Assessment: Generalized weakness    Lower Extremity Assessment Lower Extremity Assessment: LLE deficits/detail LLE: Unable to fully assess due to pain    Cervical / Trunk Assessment Cervical / Trunk Assessment: Kyphotic  Communication   Communication: No difficulties  Cognition Arousal/Alertness: Awake/alert Behavior During Therapy: Anxious Overall Cognitive Status: No family/caregiver present to determine baseline cognitive functioning  General Comments      Exercises     Assessment/Plan    PT Assessment Patient needs continued PT services  PT Problem List Decreased strength;Decreased activity tolerance;Decreased balance;Decreased mobility;Decreased safety awareness;Pain       PT Treatment Interventions DME instruction;Gait training;Stair training;Functional mobility training;Therapeutic activities;Therapeutic exercise;Balance  training;Patient/family education    PT Goals (Current goals can be found in the Care Plan section)  Acute Rehab PT Goals Patient Stated Goal: to not hurt PT Goal Formulation: With patient Time For Goal Achievement: 12/03/16 Potential to Achieve Goals: Fair    Frequency Min 3X/week (trauma)   Barriers to discharge        Co-evaluation               End of Session Equipment Utilized During Treatment: Gait belt;Oxygen Activity Tolerance: Patient limited by pain Patient left: in chair;with call bell/phone within reach Nurse Communication: Mobility status PT Visit Diagnosis: History of falling (Z91.81)    Time: 0981-1914 PT Time Calculation (min) (ACUTE ONLY): 24 min   Charges:   PT Evaluation $PT Eval Moderate Complexity: 1 Procedure PT Treatments $Therapeutic Activity: 8-22 mins   PT G Codes:        Alben Deeds, PT DPT  [phone]   Duncan Dull 11/19/2016, 9:33 AM

## 2016-11-19 NOTE — Progress Notes (Signed)
[facility]                                                                              Patient Demographics  [name] [name], is a 76 y.o. female, DOB - 05-25-1941, TGG:[id number]  Admit date - 11/16/2016   Admitting Physician [name] Bulls, MD  Outpatient Primary MD for the patient is [name] Cower, MD  Outpatient specialists:   LOS - 3  days    Chief Complaint  Patient presents with  . 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 Patient had missed her dialysis session on the day of admission and 2 days ago due to passing of her friend. X-ray of her left leg showed acute comminuted fracture off mid to distal left tibia. BMET showed BUN 95, potassium 6.9. EKG showed peaked T waves. Orthopedics and nephrology were consulted.   Assessment & Plan    Principal Problem:   Left tibia fracture  - Secondary to mechanical fall, x-rays showed comminuted fracture of mid-to-distal left tibia   - Orthopedic surgery consulted - Patient underwent intramedullary nail placement, left tibia, postop day #1  -   Active problems  Hyperkalemia - Serum potassium is 6.9 on admission with peaked T-waves noted on EKG, secondary to ESRD with missed dialysis sessions nephrology was consulted and patient underwent urgent hemodialysis. - underwent repeat HD on 4/14. -Nephrology following, HD per her schedule now    ESRD  - Pt usually dialyzes TTS, but missed 2 sessions leading up to admission  - nephrology following, patient underwent HD on 4/13 and 4/14  History of hypotension - BP modestly elevated in ED in setting of pain  - Patient however reports that her BP runs lower at home and does not take Norvasc, which is discontinued.  - BP was running low in  70s, yesterday, was started on midodrine 10 mg twice a day  COPD   - No wheezing or respiratory complaints on admission  - Continue scheduled Dulera and prn albuterol    Lung cancer, squamous cell - RUL SCC, followed by oncologist at [facility]  - She completed chemoradiation and is now undergoing imaging surveillance q64mo  - Per onc notes from Feb, there is no evidence for recurrence and her chronic dry cough is likely secondary to treatment and her new baseline    GERD - EGD from 2016 with pangastritis and mild duodenitis, small hiatal hernia, no esophagitis  - Managed with daily PPI at home, will continue   Hypothyroidism  - Appears stable, continue Synthroid    Code Status: Full CODE STATUS DVT Prophylaxis:  Heparin subcutaneous Family Communication: Discussed in detail with the patient, all imaging results, lab results explained to the patient    Disposition Plan: Currently in stepdown, PT evaluation done, recommended skilled nursing facility  Time Spent in minutes   25 minutes  Procedures:  HD PROCEDURE:  Intramedullary nail placement, left  tibia.  Consultants:   Orthopedics Nephrology  Antimicrobials:      Medications  Scheduled Meds: . allopurinol  100 mg Oral Daily  . atorvastatin  40 mg Oral Daily  . cholecalciferol  5,000 Units Oral Daily  . cinacalcet  30 mg Oral Q supper  . dextromethorphan-guaiFENesin  1 tablet Oral Daily  . DULoxetine  60 mg Oral Daily  . feeding supplement (NEPRO CARB STEADY)  237 mL Oral BID BM  . heparin  5,000 Units Subcutaneous Q8H  . hydrOXYzine  20 mg Oral Daily  . levothyroxine  175 mcg Oral QAC breakfast  . mouth rinse  15 mL Mouth Rinse BID  . midodrine  10 mg Oral BID WC  . mometasone-formoterol  2 puff Inhalation BID  . montelukast  10 mg Oral QHS  . multivitamin  1 tablet Oral QHS  . mupirocin ointment  1 application Nasal BID  . pantoprazole  40 mg Oral Daily  . pregabalin  50 mg Oral QHS  . QUEtiapine  100 mg  Oral QHS  . sevelamer carbonate  3,200 mg Oral TID WC   Continuous Infusions: PRN Meds:.albuterol, clonazePAM, cyclobenzaprine, hydrALAZINE, HYDROcodone-acetaminophen, lip balm, morphine injection, ondansetron (ZOFRAN) IV, oxyCODONE, polyethylene glycol, sevelamer carbonate   Antibiotics   Anti-infectives    Start     Dose/Rate Route Frequency Ordered Stop   11/18/16 1002  clindamycin (CLEOCIN) 900 MG/50ML IVPB    Comments:  [name] [name]   : cabinet override      11/18/16 1002 11/18/16 1015   11/17/16 0800  clindamycin (CLEOCIN) IVPB 900 mg     900 mg 100 mL/hr over 30 Minutes Intravenous To Short Stay 11/17/16 0204 11/18/16 1030        Subjective:   [name] [name] was seen and examined today. Complaining of pain in the left hip, 10 out of 10, sharp. BP is improved.  Patient denies dizziness, chest pain, shortness of breath,  abdominal pain,  nausea or vomiting, new weakness, numbess, tingling.No fevers.  Objective:   Vitals:   11/18/16 1546 11/18/16 1959 11/18/16 2353 11/19/16 0800  BP: (!) 111/52 (!) 144/66 (!) 118/51 (!) 127/50  Pulse: (!) 107 (!) 113  (!) 108  Resp: 14 (!) 23  20  Temp: 98.8 F (37.1 C) 97.5 F (36.4 C) 98.9 F (37.2 C) 99.9 F (37.7 C)  TempSrc: Oral Oral Oral Oral  SpO2: (!) 89% 97%  100%  Weight: 73.4 kg (161 lb 13.1 oz)     Height: '5\' 7"'$  (1.702 m)       Intake/Output Summary (Last 24 hours) at 11/19/16 1120 Last data filed at 11/18/16 1500  Gross per 24 hour  Intake              900 ml  Output                0 ml  Net              900 ml     Wt Readings from Last 3 Encounters:  11/18/16 73.4 kg (161 lb 13.1 oz)  10/22/16 67.1 kg (148 lb)  11/21/15 71.3 kg (157 lb 1.6 oz)     Exam  General: Alert and oriented x 3, NADUncomfortable with pain   HEENT:    Neck: Supple, no JVD  Cardiovascular: S1 S2 auscultated, no rubs, murmurs or gallops. Regular rate and rhythm.  Respiratory: Clear to auscultation bilaterally, no wheezing,  rales or rhonchi  Gastrointestinal: Soft, nontender, nondistended, +  bowel sounds  Ext: no cyanosis clubbing, Left leg in splint  Neuro: no new deficits  Skin: No rashes  Psych: Normal affect and demeanor, alert and oriented x3    Data Reviewed:  I have personally reviewed following labs and imaging studies  Micro Results Recent Results (from the past 240 hour(s))  Surgical pcr screen     Status: Abnormal   Collection Time: 11/17/16  2:39 AM  Result Value Ref Range Status   MRSA, PCR NEGATIVE NEGATIVE Final   Staphylococcus aureus POSITIVE (A) NEGATIVE Final    Comment:        The Xpert SA Assay (FDA approved for NASAL specimens in patients over 22 years of age), is one component of a comprehensive surveillance program.  Test performance has been validated by [facility] for patients greater than or equal to 31 year old. It is not intended to diagnose infection nor to guide or monitor treatment.     Radiology Reports Dg Chest 2 View  Result Date: 11/16/2016 CLINICAL DATA:  Initial evaluation for acute trauma, fall. EXAM: CHEST  2 VIEW COMPARISON:  Prior radiograph from 05/29/2016. FINDINGS: Cardiac and mediastinal silhouettes are stable in size and contour, and remain within normal limits. Aortic atherosclerosis. Lungs mildly hypoinflated. Persistent irregular and linear opacity at the right lung apex, similar to previous. Finding suspected to largely reflect post treatment changes. Residual malignancy not excluded. Similarly, superimposed infection not excluded either. No other focal airspace disease. Mild scarring at the left lung base. No pulmonary edema or pleural effusion. No pneumothorax. No acute osseus abnormality.  Scoliosis. IMPRESSION: 1. Persistent right upper lobe airspace disease. Findings suspected to largely reflect post treatment changes. Residual malignancy or superimposed infection not excluded. 2. Left basilar scarring. 3. Aortic atherosclerosis.  Electronically Signed   By: Jeannine Boga M.D.   On: 11/16/2016 16:49   Dg Tibia/fibula Left  Result Date: 11/18/2016 CLINICAL DATA:  Left tibial ORIF. EXAM: DG C-ARM 61-120 MIN; LEFT TIBIA AND FIBULA - 2 VIEW COMPARISON:  Left tibia radiographs 11/16/2016. FLUOROSCOPY TIME:  Fluoroscopy Time:  2 minutes 47 seconds Number of Acquired Spot Images: 0 FINDINGS: Patient is status post placement of an intramedullary rod. There 2 distal and 2 proximal interlocking screws. There is near anatomic reduction of the complex spiral fracture in the distal tibia. The proximal fibular fracture is stable. IMPRESSION: 1. Interval ORIF of the left tibial fracture with an intramedullary rod and interlocking screws without radiographic evidence for complication. Electronically Signed   By: San Morelle M.D.   On: 11/18/2016 15:50   Dg Tibia/fibula Left  Result Date: 11/16/2016 CLINICAL DATA:  Initial evaluation for acute trauma, fall. EXAM: LEFT TIBIA AND FIBULA - 2 VIEW COMPARISON:  None. FINDINGS: Acute comminuted fracture of the mid-distal shaft of the left tibia. Slight lateral no definite fibular fracture. Overlying soft tissue swelling at the mid leg. Displacement of the main butterfly fragment. IMPRESSION: Acute comminuted fracture of the mid-distal left tibia with overlying soft tissue swelling. Electronically Signed   By: Jeannine Boga M.D.   On: 11/16/2016 16:46   Dg C-arm 1-60 Min  Result Date: 11/18/2016 CLINICAL DATA:  Left tibial ORIF. EXAM: DG C-ARM 61-120 MIN; LEFT TIBIA AND FIBULA - 2 VIEW COMPARISON:  Left tibia radiographs 11/16/2016. FLUOROSCOPY TIME:  Fluoroscopy Time:  2 minutes 47 seconds Number of Acquired Spot Images: 0 FINDINGS: Patient is status post placement of an intramedullary rod. There 2 distal and 2 proximal interlocking screws. There is near  anatomic reduction of the complex spiral fracture in the distal tibia. The proximal fibular fracture is stable. IMPRESSION: 1.  Interval ORIF of the left tibial fracture with an intramedullary rod and interlocking screws without radiographic evidence for complication. Electronically Signed   By: San Morelle M.D.   On: 11/18/2016 15:50   Xr Knee 1-2 Views Left  Result Date: 10/23/2016 Two-view radiographs of the left knee shows bone-on-bone contact lateral joint line there is subchondral cyst with sclerosis along the joint line. There are periarticular cyst with essentially bone-on-bone contact lateral joint line.   Lab Data:  CBC:  Recent Labs Lab 11/16/16 1554 11/16/16 1814 11/18/16 0731 11/19/16 0238  WBC 14.0*  --  13.1* 11.3*  NEUTROABS 10.9*  --   --   --   HGB 13.1 13.6 10.5* 11.2*  HCT 38.9 40.0 33.4* 34.6*  MCV 86.3  --  89.1 88.9  PLT 349  --  229 268   Basic Metabolic Panel:  Recent Labs Lab 11/16/16 1814 11/17/16 0117 11/18/16 0302 11/19/16 0238  NA 130* 136 132* 133*  K 6.9* 5.1 5.1 5.8*  CL 101 91* 93* 95*  CO2  --  '23 28 25  '$ GLUCOSE 130* 110* 114* 92  BUN 95* 41* 19 29*  CREATININE 16.20* 8.21* 5.00* 6.78*  CALCIUM  --  8.8* 8.0* 7.7*  PHOS  --   --  5.7*  --    GFR: Estimated Creatinine Clearance: 6.9 mL/min (A) (by C-G formula based on SCr of 6.78 mg/dL (H)). Liver Function Tests:  Recent Labs Lab 11/18/16 0302  ALBUMIN 2.3*   No results for input(s): LIPASE, AMYLASE in the last 168 hours. No results for input(s): AMMONIA in the last 168 hours. Coagulation Profile:  Recent Labs Lab 11/17/16 0117  INR 1.47   Cardiac Enzymes: No results for input(s): CKTOTAL, CKMB, CKMBINDEX, TROPONINI in the last 168 hours. BNP (last 3 results) No results for input(s): PROBNP in the last 8760 hours. HbA1C: No results for input(s): HGBA1C in the last 72 hours. CBG:  Recent Labs Lab 11/17/16 0147 11/17/16 0650 11/17/16 1131 11/18/16 0817 11/18/16 1243  GLUCAP 107* 100* 86 89 77   Lipid Profile: No results for input(s): CHOL, HDL, LDLCALC, TRIG, CHOLHDL,  LDLDIRECT in the last 72 hours. Thyroid Function Tests: No results for input(s): TSH, T4TOTAL, FREET4, T3FREE, THYROIDAB in the last 72 hours. Anemia Panel: No results for input(s): VITAMINB12, FOLATE, FERRITIN, TIBC, IRON, RETICCTPCT in the last 72 hours. Urine analysis:    Component Value Date/Time   COLORURINE BROWN (A) 08/04/2014 0254   APPEARANCEUR TURBID (A) 08/04/2014 0254   LABSPEC 1.017 08/04/2014 0254   PHURINE 6.0 08/04/2014 0254   GLUCOSEU NEGATIVE 08/04/2014 0254   GLUCOSEU NEGATIVE 10/07/2012 1421   HGBUR LARGE (A) 08/04/2014 0254   BILIRUBINUR SMALL (A) 08/04/2014 0254   KETONESUR 15 (A) 08/04/2014 0254   PROTEINUR >300 (A) 08/04/2014 0254   UROBILINOGEN 0.2 08/04/2014 0254   NITRITE NEGATIVE 08/04/2014 0254   LEUKOCYTESUR LARGE (A) 08/04/2014 0254     Lakai Moree M.D. [facility] 11/19/2016, 11:20 AM  Pager: [phone] Between 7am to 7pm - call Pager - [phone]  After 7pm go to [email or web address] - password [email or web address]  Call night coverage person covering after 7pm

## 2016-11-20 LAB — BASIC METABOLIC PANEL
ANION GAP: 13 (ref 5–15)
BUN: 43 mg/dL — ABNORMAL HIGH (ref 6–20)
CO2: 26 mmol/L (ref 22–32)
Calcium: 7.5 mg/dL — ABNORMAL LOW (ref 8.9–10.3)
Chloride: 94 mmol/L — ABNORMAL LOW (ref 101–111)
Creatinine, Ser: 8.59 mg/dL — ABNORMAL HIGH (ref 0.44–1.00)
GFR calc Af Amer: 5 mL/min — ABNORMAL LOW (ref >60)
GFR, EST NON AFRICAN AMERICAN: 4 mL/min — AB (ref >60)
Glucose, Bld: 118 mg/dL — ABNORMAL HIGH (ref 65–99)
POTASSIUM: 5.6 mmol/L — AB (ref 3.5–5.1)
SODIUM: 133 mmol/L — AB (ref 135–145)

## 2016-11-20 LAB — CBC
HEMATOCRIT: 27.2 % — AB (ref 36.0–46.0)
HEMOGLOBIN: 8.6 g/dL — AB (ref 12.0–15.0)
MCH: 27.7 pg (ref 26.0–34.0)
MCHC: 31.3 g/dL (ref 30.0–36.0)
MCV: 88.6 fL (ref 78.0–100.0)
Platelets: 254 10*3/uL (ref 150–400)
RBC: 3.07 MIL/uL — ABNORMAL LOW (ref 3.87–5.11)
RDW: 18.2 % — AB (ref 11.5–15.5)
WBC: 14.1 10*3/uL — AB (ref 4.0–10.5)

## 2016-11-20 MED ORDER — PENTAFLUOROPROP-TETRAFLUOROETH EX AERO: 1.0000 "application " | INHALATION_SPRAY | CUTANEOUS | Status: DC | PRN

## 2016-11-20 MED ORDER — HEPARIN SODIUM (PORCINE) 1000 UNIT/ML DIALYSIS
1200.0000 [IU] | Freq: Once | INTRAMUSCULAR | Status: DC
  Filled 2016-11-20: qty 2

## 2016-11-20 MED ORDER — ALTEPLASE 2 MG IJ SOLR: 2.0000 mg | Freq: Once | INTRAMUSCULAR | Status: DC | PRN

## 2016-11-20 MED ORDER — OXYCODONE HCL 5 MG PO TABS
ORAL_TABLET | ORAL | Status: AC
  Filled 2016-11-20: qty 1

## 2016-11-20 MED ORDER — LIDOCAINE HCL (PF) 1 % IJ SOLN: 5.0000 mL | INTRAMUSCULAR | Status: DC | PRN

## 2016-11-20 MED ORDER — SODIUM CHLORIDE 0.9 % IV SOLN
100.0000 mL | INTRAVENOUS | Status: DC | PRN
Start: 2016-11-20 — End: 2016-11-22

## 2016-11-20 MED ORDER — HEPARIN SODIUM (PORCINE) 1000 UNIT/ML DIALYSIS
1000.0000 [IU] | INTRAMUSCULAR | Status: DC | PRN
  Filled 2016-11-20: qty 1

## 2016-11-20 MED ORDER — LIDOCAINE-PRILOCAINE 2.5-2.5 % EX CREA
1.0000 "application " | TOPICAL_CREAM | CUTANEOUS | Status: DC | PRN
  Filled 2016-11-20: qty 5

## 2016-11-20 MED ORDER — SODIUM CHLORIDE 0.9 % IV SOLN: 100.0000 mL | INTRAVENOUS | Status: DC | PRN

## 2016-11-20 NOTE — Care Management Important Message (Signed)
Important Message  Patient Details  Name: [name] [name] MRN: [id number] Date of Birth: [birth year]   Medicare Important Message Given:  Yes    [name] 11/20/2016, 11:42 AM

## 2016-11-20 NOTE — Progress Notes (Signed)
[facility]                                                                              Patient Demographics  [name] [name], is a 76 y.o. female, DOB - 05-02-1941, XAJ:[id number]  Admit date - 11/16/2016   Admitting Physician Vianne Bulls, MD  Outpatient Primary MD for the patient is Cathlean Cower, MD  Outpatient specialists:   LOS - 4  days    Chief Complaint  Patient presents with  . 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 Patient had missed her dialysis session on the day of admission and 2 days ago due to passing of her friend. X-ray of her left leg showed acute comminuted fracture off mid to distal left tibia. BMET showed BUN 95, potassium 6.9. EKG showed peaked T waves. Orthopedics and nephrology were consulted.   Assessment & Plan    Principal Problem:   Left tibia fracture  - Secondary to mechanical fall, x-rays showed comminuted fracture of mid-to-distal left tibia   - Orthopedic surgery consulted - Patient underwent intramedullary nail placement, left tibia, postop day #2  - Feeling a lot of pain today, seen during hemodialysis - Per PT evaluation, will need skilled nursing facility - Hemoglobin down to 8.6, monitor H&H  Active problems Anemia: Postop blood loss anemia - Hemoglobin down to 8.6, monitor closely - will need transfusion if hemoglobin less than 8 in a.m.   Hyperkalemia - Serum potassium is 6.9 on admission with peaked T-waves noted on EKG, secondary to ESRD with missed dialysis sessions nephrology was consulted and patient underwent urgent hemodialysis. - underwent repeat HD on 4/14. -Nephrology following, HD per her schedule now , IgG today   ESRD  - Pt usually dialyzes TTS, but missed 2 sessions  leading up to admission  - nephrology following, patient underwent HD on 4/13 and 4/14, HD today  History of hypotension - BP modestly elevated in ED in setting of pain  - Patient however reports that her BP runs lower at home and does not take Norvasc, which is discontinued.  - BP was running low in 70s prior to surgery, was started on midodrine 10 mg twice a day  COPD   - No wheezing or respiratory complaints on admission  - Continue scheduled Dulera and prn albuterol    Lung cancer, squamous cell - RUL SCC, followed by oncologist at [facility]  - She completed chemoradiation and is now undergoing imaging surveillance q56mo  - Per onc notes from Feb, there is no evidence for recurrence and her chronic dry cough is likely secondary to treatment and her new baseline    GERD - EGD from 2016 with pangastritis and mild duodenitis, small hiatal hernia, no esophagitis  - Managed with daily PPI at home, will continue   Hypothyroidism  - Appears stable, continue Synthroid    Code Status: Full CODE STATUS DVT Prophylaxis:  Heparin subcutaneous Family Communication: Discussed in detail with the patient, all imaging results, lab results explained to the patient    Disposition Plan: Currently in stepdown, PT evaluation done, recommended skilled nursing facility  Time Spent in minutes   25 minutes  Procedures:  HD PROCEDURE:  Intramedullary nail placement, left tibia.  Consultants:   Orthopedics Nephrology  Antimicrobials:   None   Medications  Scheduled Meds: . allopurinol  100 mg Oral Daily  . atorvastatin  40 mg Oral Daily  . cholecalciferol  5,000 Units Oral Daily  . cinacalcet  30 mg Oral Q supper  . dextromethorphan-guaiFENesin  1 tablet Oral Daily  . DULoxetine  60 mg Oral Daily  . feeding supplement (NEPRO CARB STEADY)  237 mL Oral BID BM  . heparin  5,000 Units Subcutaneous Q8H  . hydrOXYzine  20 mg Oral Daily  . levothyroxine  175 mcg Oral QAC breakfast  .  mouth rinse  15 mL Mouth Rinse BID  . midodrine  10 mg Oral BID WC  . mometasone-formoterol  2 puff Inhalation BID  . montelukast  10 mg Oral QHS  . multivitamin  1 tablet Oral QHS  . mupirocin ointment  1 application Nasal BID  . pantoprazole  40 mg Oral Daily  . pregabalin  50 mg Oral QHS  . QUEtiapine  100 mg Oral QHS  . sevelamer carbonate  3,200 mg Oral TID WC   Continuous Infusions: PRN Meds:.albuterol, clonazePAM, cyclobenzaprine, hydrALAZINE, HYDROcodone-acetaminophen, lip balm, morphine injection, ondansetron (ZOFRAN) IV, oxyCODONE, polyethylene glycol, sevelamer carbonate   Antibiotics   Anti-infectives    Start     Dose/Rate Route Frequency Ordered Stop   11/18/16 1002  clindamycin (CLEOCIN) 900 MG/50ML IVPB    Comments:  [name]   : cabinet override      11/18/16 1002 11/18/16 1015   11/17/16 0800  clindamycin (CLEOCIN) IVPB 900 mg     900 mg 100 mL/hr over 30 Minutes Intravenous To Short Stay 11/17/16 0204 11/18/16 1030        Subjective:   [name] was seen and examined today. Patient seen in HD today, complaining of pain in the hip, uncomfortable.  Patient denies dizziness, chest pain, shortness of breath,  abdominal pain,  nausea or vomiting, new weakness, numbess, tingling.No fevers.  Objective:   Vitals:   11/20/16 0930 11/20/16 1000 11/20/16 1030 11/20/16 1100  BP: (!) 102/52 (!) 108/49 (!) 104/50 (!) 106/53  Pulse: (!) 110 (!) 110 (!) 105 (!) 102  Resp:      Temp:      TempSrc:      SpO2:      Weight:      Height:        Intake/Output Summary (Last 24 hours) at 11/20/16 1119 Last data filed at 11/20/16 0600  Gross per 24 hour  Intake              360 ml  Output                0 ml  Net              360 ml     Wt Readings from Last 3 Encounters:  11/20/16 72.5 kg (159 lb 13.3 oz)  10/22/16 67.1 kg (148 lb)  11/21/15 71.3 kg (157 lb 1.6 oz)     Exam  General: Alert and oriented x 3, NAD, Uncomfortable   HEENT:    Neck:  Supple, no JVD  Cardiovascular:  S1 and S2 clear, tachycardia  Respiratory: Clear to auscultation bilaterally, no wheezing, rales or rhonchi  Gastrointestinal: Soft, nontender, nondistended, + bowel sounds  Ext: no cyanosis clubbing, Left leg in splint  Neuro: no new deficits  Skin: No rashes  Psych: Normal affect and demeanor, alert and oriented x3    Data Reviewed:  I have personally reviewed following labs and imaging studies  Micro Results Recent Results (from the past 240 hour(s))  Surgical pcr screen     Status: Abnormal   Collection Time: 11/17/16  2:39 AM  Result Value Ref Range Status   MRSA, PCR NEGATIVE NEGATIVE Final   Staphylococcus aureus POSITIVE (A) NEGATIVE Final    Comment:        The Xpert SA Assay (FDA approved for NASAL specimens in patients over 12 years of age), is one component of a comprehensive surveillance program.  Test performance has been validated by [facility] for patients greater than or equal to 44 year old. It is not intended to diagnose infection nor to guide or monitor treatment.     Radiology Reports Dg Chest 2 View  Result Date: 11/16/2016 CLINICAL DATA:  Initial evaluation for acute trauma, fall. EXAM: CHEST  2 VIEW COMPARISON:  Prior radiograph from 05/29/2016. FINDINGS: Cardiac and mediastinal silhouettes are stable in size and contour, and remain within normal limits. Aortic atherosclerosis. Lungs mildly hypoinflated. Persistent irregular and linear opacity at the right lung apex, similar to previous. Finding suspected to largely reflect post treatment changes. Residual malignancy not excluded. Similarly, superimposed infection not excluded either. No other focal airspace disease. Mild scarring at the left lung base. No pulmonary edema or pleural effusion. No pneumothorax. No acute osseus abnormality.  Scoliosis. IMPRESSION: 1. Persistent right upper lobe airspace disease. Findings suspected to largely reflect post treatment  changes. Residual malignancy or superimposed infection not excluded. 2. Left basilar scarring. 3. Aortic atherosclerosis. Electronically Signed   By: Jeannine Boga M.D.   On: 11/16/2016 16:49   Dg Tibia/fibula Left  Result Date: 11/18/2016 CLINICAL DATA:  Left tibial ORIF. EXAM: DG C-ARM 61-120 MIN; LEFT TIBIA AND FIBULA - 2 VIEW COMPARISON:  Left tibia radiographs 11/16/2016. FLUOROSCOPY TIME:  Fluoroscopy Time:  2 minutes 47 seconds Number of Acquired Spot Images: 0 FINDINGS: Patient is status post placement of an intramedullary rod. There 2 distal and 2 proximal interlocking screws. There is near anatomic reduction of the complex spiral fracture in the distal tibia. The proximal fibular fracture is stable. IMPRESSION: 1. Interval ORIF of the left tibial fracture with an intramedullary rod and interlocking screws without radiographic evidence for complication. Electronically Signed   By: San Morelle M.D.   On: 11/18/2016 15:50   Dg Tibia/fibula Left  Result Date: 11/16/2016 CLINICAL DATA:  Initial evaluation for acute trauma, fall. EXAM: LEFT TIBIA AND FIBULA - 2 VIEW COMPARISON:  None. FINDINGS: Acute comminuted fracture of the mid-distal shaft of the left tibia. Slight lateral no definite fibular fracture. Overlying soft tissue swelling at the mid leg. Displacement of the main butterfly fragment. IMPRESSION: Acute comminuted fracture of the mid-distal left tibia with overlying soft tissue swelling. Electronically Signed   By: Jeannine Boga M.D.   On: 11/16/2016 16:46   Dg C-arm 1-60 Min  Result Date: 11/18/2016 CLINICAL DATA:  Left tibial ORIF. EXAM: DG C-ARM 61-120 MIN; LEFT TIBIA AND FIBULA - 2 VIEW COMPARISON:  Left tibia radiographs 11/16/2016. FLUOROSCOPY TIME:  Fluoroscopy Time:  2 minutes 47 seconds Number of Acquired Spot Images:  0 FINDINGS: Patient is status post placement of an intramedullary rod. There 2 distal and 2 proximal interlocking screws. There is near  anatomic reduction of the complex spiral fracture in the distal tibia. The proximal fibular fracture is stable. IMPRESSION: 1. Interval ORIF of the left tibial fracture with an intramedullary rod and interlocking screws without radiographic evidence for complication. Electronically Signed   By: San Morelle M.D.   On: 11/18/2016 15:50   Xr Knee 1-2 Views Left  Result Date: 10/23/2016 Two-view radiographs of the left knee shows bone-on-bone contact lateral joint line there is subchondral cyst with sclerosis along the joint line. There are periarticular cyst with essentially bone-on-bone contact lateral joint line.   Lab Data:  CBC:  Recent Labs Lab 11/16/16 1554 11/16/16 1814 11/18/16 0731 11/19/16 0238 11/20/16 0231  WBC 14.0*  --  13.1* 11.3* 14.1*  NEUTROABS 10.9*  --   --   --   --   HGB 13.1 13.6  13.6 10.5* 11.2* 8.6*  HCT 38.9 40.0  40.0 33.4* 34.6* 27.2*  MCV 86.3  --  89.1 88.9 88.6  PLT 349  --  229 195 622   Basic Metabolic Panel:  Recent Labs Lab 11/16/16 1814 11/17/16 0117 11/18/16 0302 11/19/16 0238 11/20/16 0231  NA 130*  130* 136 132* 133* 133*  K SPECIMEN HEMOLYZED. HEMOLYSIS MAY AFFECT INTEGRITY OF RESULTS.  6.9* 5.1 5.1 5.8* 5.6*  CL 101  101 91* 93* 95* 94*  CO2  --  '23 28 25 26  '$ GLUCOSE 130*  130* 110* 114* 92 118*  BUN 95*  95* 41* 19 29* 43*  CREATININE 16.20*  16.20* 8.21* 5.00* 6.78* 8.59*  CALCIUM  --  8.8* 8.0* 7.7* 7.5*  PHOS  --   --  5.7*  --   --    GFR: Estimated Creatinine Clearance: 5.4 mL/min (A) (by C-G formula based on SCr of 8.59 mg/dL (H)). Liver Function Tests:  Recent Labs Lab 11/18/16 0302  ALBUMIN 2.3*   No results for input(s): LIPASE, AMYLASE in the last 168 hours. No results for input(s): AMMONIA in the last 168 hours. Coagulation Profile:  Recent Labs Lab 11/17/16 0117  INR 1.47   Cardiac Enzymes: No results for input(s): CKTOTAL, CKMB, CKMBINDEX, TROPONINI in the last 168 hours. BNP (last 3  results) No results for input(s): PROBNP in the last 8760 hours. HbA1C: No results for input(s): HGBA1C in the last 72 hours. CBG:  Recent Labs Lab 11/17/16 0147 11/17/16 0650 11/17/16 1131 11/18/16 0817 11/18/16 1243  GLUCAP 107* 100* 86 89 77   Lipid Profile: No results for input(s): CHOL, HDL, LDLCALC, TRIG, CHOLHDL, LDLDIRECT in the last 72 hours. Thyroid Function Tests: No results for input(s): TSH, T4TOTAL, FREET4, T3FREE, THYROIDAB in the last 72 hours. Anemia Panel: No results for input(s): VITAMINB12, FOLATE, FERRITIN, TIBC, IRON, RETICCTPCT in the last 72 hours. Urine analysis:    Component Value Date/Time   COLORURINE BROWN (A) 08/04/2014 0254   APPEARANCEUR TURBID (A) 08/04/2014 0254   LABSPEC 1.017 08/04/2014 0254   PHURINE 6.0 08/04/2014 0254   GLUCOSEU NEGATIVE 08/04/2014 0254   GLUCOSEU NEGATIVE 10/07/2012 1421   HGBUR LARGE (A) 08/04/2014 0254   BILIRUBINUR SMALL (A) 08/04/2014 0254   KETONESUR 15 (A) 08/04/2014 0254   PROTEINUR >300 (A) 08/04/2014 0254   UROBILINOGEN 0.2 08/04/2014 0254   NITRITE NEGATIVE 08/04/2014 0254   LEUKOCYTESUR LARGE (A) 08/04/2014 0254     Izel Eisenhardt M.D. [facility] 11/20/2016, 11:19 AM  Pager: [phone] Between 7am to 7pm - call Pager - [phone]  After 7pm go to [email or web address] - password [email or web address]  Call night coverage person covering after 7pm

## 2016-11-20 NOTE — Progress Notes (Signed)
Dialysis called and report given. Patient has been sent with a snack, has received pain medications, and blood pressure medications.

## 2016-11-20 NOTE — Discharge Instructions (Signed)
No weight bearing on your left leg until further notice. Keep your dressings clean and dry.

## 2016-11-20 NOTE — Progress Notes (Signed)
[facility] Progress Note   Subjective: no c/o  Vitals:   11/20/16 1200 11/20/16 1205 11/20/16 1330 11/20/16 1342  BP: (!) 120/48 (!) 120/55    Pulse: 100 (!) 104 (!) 109 (!) 106  Resp:  '19 13 17  '$ Temp:  97.4 F (36.3 C)    TempSrc:  Oral    SpO2:  100% (!) 79% (!) 75%  Weight:  70.5 kg (155 lb 6.8 oz)    Height:        Inpatient medications: . allopurinol  100 mg Oral Daily  . atorvastatin  40 mg Oral Daily  . cholecalciferol  5,000 Units Oral Daily  . cinacalcet  30 mg Oral Q supper  . dextromethorphan-guaiFENesin  1 tablet Oral Daily  . DULoxetine  60 mg Oral Daily  . feeding supplement (NEPRO CARB STEADY)  237 mL Oral BID BM  . heparin  5,000 Units Subcutaneous Q8H  . hydrOXYzine  20 mg Oral Daily  . levothyroxine  175 mcg Oral QAC breakfast  . mouth rinse  15 mL Mouth Rinse BID  . midodrine  10 mg Oral BID WC  . mometasone-formoterol  2 puff Inhalation BID  . montelukast  10 mg Oral QHS  . multivitamin  1 tablet Oral QHS  . mupirocin ointment  1 application Nasal BID  . pantoprazole  40 mg Oral Daily  . pregabalin  50 mg Oral QHS  . QUEtiapine  100 mg Oral QHS  . sevelamer carbonate  3,200 mg Oral TID WC    albuterol, clonazePAM, cyclobenzaprine, hydrALAZINE, HYDROcodone-acetaminophen, lip balm, morphine injection, ondansetron (ZOFRAN) IV, oxyCODONE, polyethylene glycol, sevelamer carbonate  Exam: Frail edlerly AAF, alert No jvd Chest clear bilat RRR no RG Abd soft ntnd LLE wrapped ace bandage, bilat old toe amps RUA AVF+bruit NF, ox 2  Dialysis: TTS [facility] 3h 61mn  69.5kg   R AVF   Hep 1200 - Hect 6 ug - last Mircera 2/19 - Venofer 50/wk      Assessment: 1. Tib/ fib fracture LLE - sp ORIF 4/15 2. ESRD TTS HD (last AF in April very low at 148 and never repeated- it is not known if this was an error or not, last intervention was 10/22/16 by Dr. DScot Dockwhen she had a PTA of 709%basilic vein stenosis) 3. Anemia / CKD - Hb 13  > 10 here,  resuming ESA tues w HD at 60/wk 4. MBD/ CKD - cont hect, sensipar/ renvela 5. Volume- up 2kg 6. Hx R lung [place] cancer - s/p chemoradiation, f/b [facility]  Plan - HD today, UF 2 L   RKelly SplinterMD [facility] pager [phone]  11/20/2016, 3:48 PM    Recent Labs Lab 11/18/16 0302 11/19/16 0238 11/20/16 0231  NA 132* 133* 133*  K 5.1 5.8* 5.6*  CL 93* 95* 94*  CO2 '28 25 26  '$ GLUCOSE 114* 92 118*  BUN 19 29* 43*  CREATININE 5.00* 6.78* 8.59*  CALCIUM 8.0* 7.7* 7.5*  PHOS 5.7*  --   --     Recent Labs Lab 11/18/16 0302  ALBUMIN 2.3*    Recent Labs Lab 11/16/16 1554  11/18/16 0731 11/19/16 0238 11/20/16 0231  WBC 14.0*  --  13.1* 11.3* 14.1*  NEUTROABS 10.9*  --   --   --   --   HGB 13.1  < > 10.5* 11.2* 8.6*  HCT 38.9  < > 33.4* 34.6* 27.2*  MCV 86.3  --  89.1 88.9 88.6  PLT 349  --  229 195 254  < > = values in this interval not displayed. Iron/TIBC/Ferritin/ %Sat    Component Value Date/Time   IRON 119 05/26/2015 1040   TIBC 220 (L) 05/26/2015 1040   FERRITIN 950 (H) 05/26/2015 1040   IRONPCTSAT 54 (H) 05/26/2015 1040

## 2016-11-20 NOTE — Clinical Social Work Note (Signed)
Clinical Social Work Assessment  Patient Details  Name: [name] [name] MRN: [id number] Date of Birth: January 13, 1941  Date of referral:  11/20/16               Reason for consult:  Facility Placement                Permission sought to share information with:  Facility Sport and exercise psychologist, Family Supports Permission granted to share information::  Yes, Verbal Permission Granted  Name::        Agency::  SNF  Relationship::     Contact Information:     Housing/Transportation Living arrangements for the past 2 months:  Single Family Home Source of Information:  Patient Patient Interpreter Needed:  None Criminal Activity/Legal Involvement Pertinent to Current Situation/Hospitalization:  No - Comment as needed Significant Relationships:  Other Family Members, Siblings Lives with:  Self Do you feel safe going back to the place where you live?  No Need for family participation in patient care:  No (Coment)  Care giving concerns: Pt lives at home alone- normally independent but does not think she can return home safely with current level of impairment.   Social Worker assessment / plan:  CSW spoke with pt concerning PT recommendation for SNF.  Pt has been to SNF before and is familiar with the process.  Employment status:  Retired Forensic scientist:  Medicare PT Recommendations:  [place] / Referral to community resources:  [place]  Patient/Family's Response to care:  Patient is agreeable to SNF at time of DC- states that she has been to [facility] in the past and had a good experience there/was near friends.  Patient/Family's Understanding of and Emotional Response to Diagnosis, Current Treatment, and Prognosis: Pt seems to have good understanding of current condition- hopeful she can return to living independently following short rehab stay.  Emotional Assessment Appearance:  Appears stated age Attitude/Demeanor/Rapport:    Affect  (typically observed):  Appropriate Orientation:  Oriented to Self, Oriented to Place, Oriented to  Time, Oriented to Situation Alcohol / Substance use:  Not Applicable Psych involvement (Current and /or in the community):  No (Comment)  Discharge Needs  Concerns to be addressed:  Care Coordination Readmission within the last 30 days:  No Current discharge risk:  Physical Impairment Barriers to Discharge:  Continued Medical Work up   Jorge Ny, LCSW 11/20/2016, 3:55 PM

## 2016-11-21 ENCOUNTER — Ambulatory Visit (INDEPENDENT_AMBULATORY_CARE_PROVIDER_SITE_OTHER): Payer: Medicare Other | Admitting: Orthopedic Surgery

## 2016-11-21 ENCOUNTER — Inpatient Hospital Stay (HOSPITAL_COMMUNITY): Payer: Medicare Other

## 2016-11-21 ENCOUNTER — Encounter (HOSPITAL_COMMUNITY): Payer: Self-pay | Admitting: Orthopaedic Surgery

## 2016-11-21 LAB — BLOOD GAS, ARTERIAL
ACID-BASE EXCESS: 2.4 mmol/L — AB (ref 0.0–2.0)
BICARBONATE: 26.3 mmol/L (ref 20.0–28.0)
DRAWN BY: 406621
FIO2: 45
O2 CONTENT: 8 L/min
O2 SAT: 99 %
PATIENT TEMPERATURE: 98.6
PCO2 ART: 40.2 mmHg (ref 32.0–48.0)
pH, Arterial: 7.431 (ref 7.350–7.450)
pO2, Arterial: 161 mmHg — ABNORMAL HIGH (ref 83.0–108.0)

## 2016-11-21 LAB — CBC
HCT: 27.3 % — ABNORMAL LOW (ref 36.0–46.0)
Hemoglobin: 8.7 g/dL — ABNORMAL LOW (ref 12.0–15.0)
MCH: 28.3 pg (ref 26.0–34.0)
MCHC: 31.9 g/dL (ref 30.0–36.0)
MCV: 88.9 fL (ref 78.0–100.0)
PLATELETS: 268 10*3/uL (ref 150–400)
RBC: 3.07 MIL/uL — AB (ref 3.87–5.11)
RDW: 18.3 % — ABNORMAL HIGH (ref 11.5–15.5)
WBC: 16.7 10*3/uL — AB (ref 4.0–10.5)

## 2016-11-21 LAB — BASIC METABOLIC PANEL
Anion gap: 13 (ref 5–15)
BUN: 22 mg/dL — ABNORMAL HIGH (ref 6–20)
CHLORIDE: 92 mmol/L — AB (ref 101–111)
CO2: 29 mmol/L (ref 22–32)
CREATININE: 5.3 mg/dL — AB (ref 0.44–1.00)
Calcium: 8.1 mg/dL — ABNORMAL LOW (ref 8.9–10.3)
GFR, EST AFRICAN AMERICAN: 8 mL/min — AB (ref >60)
GFR, EST NON AFRICAN AMERICAN: 7 mL/min — AB (ref >60)
Glucose, Bld: 128 mg/dL — ABNORMAL HIGH (ref 65–99)
Potassium: 4.3 mmol/L (ref 3.5–5.1)
Sodium: 134 mmol/L — ABNORMAL LOW (ref 135–145)

## 2016-11-21 MED ORDER — DOXERCALCIFEROL 4 MCG/2ML IV SOLN
2.0000 ug | INTRAVENOUS | Status: DC
  Administered 2016-11-22 – 2016-11-24 (×2): 2 ug via INTRAVENOUS
  Filled 2016-11-21 (×4): qty 2

## 2016-11-21 MED ORDER — DARBEPOETIN ALFA 100 MCG/0.5ML IJ SOSY
100.0000 ug | PREFILLED_SYRINGE | INTRAMUSCULAR | Status: DC
  Administered 2016-11-22: 100 ug via INTRAVENOUS
  Filled 2016-11-21: qty 0.5

## 2016-11-21 NOTE — Progress Notes (Signed)
Pt found to be wearing [place] but it was turned off. Pt spo2 85%. O2 turned on to 1L [place] with spo2 improving to 98%. RT will continue to monitor

## 2016-11-21 NOTE — Progress Notes (Addendum)
[facility] Progress Note   Subjective: no c/o  Vitals:   11/21/16 1121 11/21/16 1128 11/21/16 1134 11/21/16 1136  BP:      Pulse: (!) 102 99 100 95  Resp: 14 18 (!) 26 16  Temp:      TempSrc:      SpO2: (!) 69% (!) 83% (!) 85% 95%  Weight:      Height:        Inpatient medications: . allopurinol  100 mg Oral Daily  . atorvastatin  40 mg Oral Daily  . cholecalciferol  5,000 Units Oral Daily  . cinacalcet  30 mg Oral Q supper  . dextromethorphan-guaiFENesin  1 tablet Oral Daily  . [START ON 11/22/2016] doxercalciferol  2 mcg Intravenous Q T,Th,Sa-HD  . DULoxetine  60 mg Oral Daily  . feeding supplement (NEPRO CARB STEADY)  237 mL Oral BID BM  . heparin  1,200 Units Dialysis Once in dialysis  . heparin  5,000 Units Subcutaneous Q8H  . hydrOXYzine  20 mg Oral Daily  . levothyroxine  175 mcg Oral QAC breakfast  . mouth rinse  15 mL Mouth Rinse BID  . midodrine  10 mg Oral BID WC  . mometasone-formoterol  2 puff Inhalation BID  . montelukast  10 mg Oral QHS  . multivitamin  1 tablet Oral QHS  . mupirocin ointment  1 application Nasal BID  . pantoprazole  40 mg Oral Daily  . pregabalin  50 mg Oral QHS  . QUEtiapine  100 mg Oral QHS  . sevelamer carbonate  3,200 mg Oral TID WC   . sodium chloride    . sodium chloride     sodium chloride, sodium chloride, albuterol, alteplase, clonazePAM, cyclobenzaprine, heparin, hydrALAZINE, HYDROcodone-acetaminophen, lidocaine (PF), lidocaine-prilocaine, lip balm, morphine injection, ondansetron (ZOFRAN) IV, oxyCODONE, pentafluoroprop-tetrafluoroeth, polyethylene glycol, sevelamer carbonate  Exam: Frail edlerly AAF, alert No jvd Chest clear bilat RRR no RG Abd soft ntnd LLE wrapped ace bandage, bilat old toe amps RUA AVF+bruit NF, ox 2  Dialysis: TTS [facility] 3h 29mn  69.5kg   R AVF   Hep 1200 - Hect 6 ug - last Mircera 2/19 - Venofer 50/wk      Assessment: 1. Tib/ fib fracture LLE - sp ORIF 4/15 2. ESRD TTS HD  (last AF in April very low at 148 and never repeated- it is not known if this was an error or not, last intervention was 10/22/16 by Dr. DScot Dockwhen she had a PTA of 794%basilic vein stenosis) 3. Anemia / CKD - Hb 13  > 10> 8-9 here, resuming ESA w darbe here at 100ug /wk; check Fe 4. MBD/ CKD - cont hect, sensipar/ renvela 5. Volume- at dry wt but may need fluid off, had O2 sat drop and is still on oxygen. Check CXR. 6. Hypotension - started midodrine here 10 mg bid  7. Hx R lung [name] cancer - s/p chemoradiation, f/b [facility]  Plan - HD tomorrow, lower dry wt/ max UF, midodrine, CXR   RKelly SplinterMD [facility] pager [phone]  11/21/2016, 11:43 AM    Recent Labs Lab 11/18/16 0302 11/19/16 0238 11/20/16 0231 11/21/16 0247  NA 132* 133* 133* 134*  K 5.1 5.8* 5.6* 4.3  CL 93* 95* 94* 92*  CO2 '28 25 26 29  '$ GLUCOSE 114* 92 118* 128*  BUN 19 29* 43* 22*  CREATININE 5.00* 6.78* 8.59* 5.30*  CALCIUM 8.0* 7.7* 7.5* 8.1*  PHOS 5.7*  --   --   --  Recent Labs Lab 11/18/16 0302  ALBUMIN 2.3*    Recent Labs Lab 11/16/16 1554  11/19/16 0238 11/20/16 0231 11/21/16 0247  WBC 14.0*  < > 11.3* 14.1* 16.7*  NEUTROABS 10.9*  --   --   --   --   HGB 13.1  < > 11.2* 8.6* 8.7*  HCT 38.9  < > 34.6* 27.2* 27.3*  MCV 86.3  < > 88.9 88.6 88.9  PLT 349  < > 195 254 268  < > = values in this interval not displayed. Iron/TIBC/Ferritin/ %Sat    Component Value Date/Time   IRON 119 05/26/2015 1040   TIBC 220 (L) 05/26/2015 1040   FERRITIN 950 (H) 05/26/2015 1040   IRONPCTSAT 54 (H) 05/26/2015 1040

## 2016-11-21 NOTE — Progress Notes (Signed)
Physical Therapy Treatment Patient Details Name: [name] [name] MRN: [id number] DOB: 05-15-1941 Today's Date: 11/21/2016    History of Present Illness 76 y.o. 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     PT Comments    Patient seen for OOb progression, plan was to attempt upright activity, but patient remains confused with difficulty sequencing and required multi modal commands for simple tasks. Did not feel patient could safely attempt NWBing standing. Performed EOB activity and tolerated transfer OOB to chair. Will continue to progress as tolerated.    Follow Up Recommendations  SNF;Supervision/Assistance - 24 hour     Equipment Recommendations  None recommended by PT    Recommendations for Other Services       Precautions / Restrictions Precautions Precautions: Fall Restrictions Weight Bearing Restrictions: Yes LLE Weight Bearing: Non weight bearing    Mobility  Bed Mobility Overal bed mobility: Needs Assistance Bed Mobility: Supine to Sit     Supine to sit: Mod assist     General bed mobility comments: Patient able to use UE to pull to sit, reuiqred manual assist for movement of LEs to EOB. hand over hand technique required for positioning as patient with difficulty following commands  Transfers Overall transfer level: Needs assistance Equipment used:  (face to face) Transfers: Lateral/Scoot Transfers (from bed to chair in sitting moving to the left)     Squat pivot transfers: Max assist     General transfer comment: performed a lateral pivot/scoot to the drop arm chair going to the left due to [place] did not attempt stand  pivot in conjunction with patient confusion and difficulty sequencing commands  Ambulation/Gait             General Gait Details: unable to perform   Stairs            Wheelchair Mobility    Modified Rankin (Stroke Patients Only)       Balance Overall balance assessment: History of Falls                                          Cognition Arousal/Alertness: Awake/alert Behavior During Therapy: Anxious Overall Cognitive Status: Impaired/Different from baseline Area of Impairment: Orientation;Memory;Following commands;Safety/judgement                 Orientation Level: Disoriented to;Place;Time;Situation   Memory: Decreased recall of precautions;Decreased short-term memory (despite multiple cues ) Following Commands: Follows one step commands inconsistently       General Comments: patient with noted confusion throughout session. Could not rememeber where she was or why. After prompting, continued to forget situtation      Exercises      General Comments General comments (skin integrity, edema, etc.): tolerated EOB activity ~ 5 minutes but fatigues and develops left lateral lean      Pertinent Vitals/Pain Pain Assessment: Faces Faces Pain Scale: Hurts little more (zero at rest) Pain Location: left LE Pain Descriptors / Indicators: Grimacing;Guarding;Moaning    Home Living  Prior Function            PT Goals (current goals can now be found in the care plan section) Acute Rehab PT Goals Patient Stated Goal: to not hurt PT Goal Formulation: With patient Time For Goal Achievement: 12/03/16 Potential to Achieve Goals: Fair Progress towards PT goals: Progressing toward goals    Frequency    Min 3X/week (trauma)      PT Plan Current plan remains appropriate    Co-evaluation             End of Session Equipment Utilized During Treatment: Gait belt;Oxygen Activity Tolerance: Patient  limited by pain Patient left: in chair;with call bell/phone within reach Nurse Communication: Mobility status PT Visit Diagnosis: History of falling (Z91.81)     Time: 5183-3582 PT Time Calculation (min) (ACUTE ONLY): 22 min  Charges:  $Therapeutic Activity: 8-22 mins                    G Codes:       Alben Deeds, PT DPT  [phone]    Duncan Dull 11/21/2016, 11:50 AM

## 2016-11-21 NOTE — Progress Notes (Signed)
Pt taken off VM and placed on 6L HFNC at this time. Pt in no distress, no increased WOB. Pt sleepy. ABG within normal limits. RT will continue to monitor.

## 2016-11-21 NOTE — Progress Notes (Signed)
Nutrition Follow Up  DOCUMENTATION CODES:   Not applicable  INTERVENTION:    Continue Nepro Shake po BID, each supplement provides 425 kcal and 19 grams protein  NUTRITION DIAGNOSIS:   Increased nutrient needs related to chronic illness as evidenced by estimated needs, ongoing   GOAL:   Patient will meet greater than or equal to 90% of their needs, progressing   MONITOR:   PO intake, Supplement acceptance, Labs, Weight trends, Skin, I & O's  ASSESSMENT:   76 year old female with ESRD on HD TTS, hypertension, osteoarthritis, 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 Patient had missed her dialysis session on the day of admission and 2 days ago due to passing of her friend. X-ray of her left leg showed acute comminuted fracture off mid to distal left tibia.   Pt s/p procedure 4/15: INTRAMEDULLARY NAIL TIBIAL (Left)  Pt continues on a Renal/Carbohydrate Modified diet. PO intake very poor at 0-20% per flowsheets. Per RN pt is drinking her Nepro Shakes BID. Labs reviewed.  Sodium 134 (L). Nephrology following for HD.  Diet Order:  Diet renal/carb modified with fluid restriction Diet-HS Snack? Nothing; Room service appropriate? Yes; Fluid consistency: Thin  Skin:  Reviewed, no issues  Last BM:  4/15  Height:   Ht Readings from Last 1 Encounters:  11/18/16 '5\' 7"'$  (1.702 m)   Weight:   Wt Readings from Last 1 Encounters:  11/20/16 155 lb 6.8 oz (70.5 kg)   Ideal Body Weight:  62.5 kg  BMI:  Body mass index is 24.34 kg/m.  Estimated Nutritional Needs:   Kcal:  1800-2000  Protein:  85-95 grams  Fluid:  Per MD  EDUCATION NEEDS:   No education needs identified at this time   Arthur Holms, RD, LDN Pager #: [phone] After-Hours Pager #: [phone]

## 2016-11-21 NOTE — Progress Notes (Signed)
[facility]  PROGRESS NOTE  [name] [name] Spruce JAS:[id number] DOB: 11-Apr-1941 DOA: 11/16/2016 PCP: Cathlean Cower, MD   Brief HPI:   76 year old female with ESRD on HD TTS, hypertension, osteoarthritis, 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 Patient had missed her dialysis session on the day of admission and 2 days ago due to passing of her friend. X-ray of her left leg showed acute comminuted fracture off mid to distal left tibia. BMET showed BUN 95, potassium 6.9. EKG showed peaked T waves. Orthopedics and nephrology were consulted.    Subjective   Patient seen and examined, continues to have episodes of hypoxia. With O2 sats dropping to 80s. Patient is consistently requiring 4-5 L of oxygen via nasal cannula. Denies chest pain.   Assessment/Plan:     1. Left tibia fracture- secondary to mechanical fall, x-ray showed comminuted fracture mid to distal left tibia. Patient was seen by orthopedic surgery. She underwent intramedullary nail placement left tibia. Postop day #3. Patient has a bed available at skilled nursing facility. Will discharge once medically stable. We'll await orthopedics recommendation regarding DVT prophylaxis and postop care with follow-up. 2. Anemia-hemoglobin is stable at 8.7, was 8.6 yesterday. Likely postop blood loss anemia. Will transfuse for hemoglobin less than 8. 3. Hyperkalemia- patient's potassium was 6.9 on admission with peaked T waves noted on EKG, secondary to ESRD with missed hemodialysis. Nephrology was consulted and patient underwent urgent hemodialysis. Today hemoglobin is 4.3. 4. Acute hypoxic respiratory failure- patient is consistently requiring 4-5 L of oxygen via nasal cannula. O2 sats dropping to 80's on room air. Previous chest x-ray on 11/16/2016 showed persistent right upper lobe airspace disease. Will repeat chest x-ray and  obtain ABG. Patient has history of COPD, squamous cell lung cancer. 5. ESRD- patient gets dialysis Tuesday Thursday and Saturday, she missed 2 sessions prior to admission. Nephrology following. Patient underwent hemodialysis yesterday on 11/20/2016, next hemodialysis on 11/22/16. 6. COPD- patient denies shortness of breath at this time, continue Dulera , when necessary albuterol. 7. Lung cancer, squamous cell- patient is followed by oncologist at the [facility]. Patient completed chemoradiation and is now undergoing imaging surveillance every 3 months. Per oncology note from February there is no evidence of recurrence and her chronic dry cough is likely due to treatment and her new baseline. 8. GERD- stable, continue daily PPIs 9. Hypothyroidism-stable, continue Synthroid    DVT prophylaxis: Heparin  Code Status: Full code  Family Communication: No family present at bedside   Disposition Plan: Skilled nursing facility   Consultants:  Orthopedics  Procedures:  Intramedullary nail placement in left tibia  Continuous infusions . sodium chloride    . sodium chloride        Antibiotics:   Anti-infectives    Start     Dose/Rate Route Frequency Ordered Stop   11/18/16 1002  clindamycin (CLEOCIN) 900 MG/50ML IVPB    Comments:  [name]   : cabinet override      11/18/16 1002 11/18/16 1015   11/17/16 0800  clindamycin (CLEOCIN) IVPB 900 mg     900 mg 100 mL/hr over 30 Minutes Intravenous To Short Stay 11/17/16 0204 11/18/16 1030       Objective   Vitals:   11/21/16 1121 11/21/16 1128 11/21/16 1134 11/21/16 1136  BP:      Pulse: (!) 102 99 100 95  Resp: 14 18 (!)  26 16  Temp:      TempSrc:      SpO2: (!) 69% (!) 83% (!) 85% 95%  Weight:      Height:        Intake/Output Summary (Last 24 hours) at 11/21/16 1159 Last data filed at 11/21/16 1022  Gross per 24 hour  Intake              840 ml  Output             2000 ml  Net            -1160 ml    Filed Weights   11/18/16 1546 11/20/16 0807 11/20/16 1205  Weight: 73.4 kg (161 lb 13.1 oz) 72.5 kg (159 lb 13.3 oz) 70.5 kg (155 lb 6.8 oz)     Physical Examination:  General exam: Appears calm and comfortable. Respiratory system: Clear to auscultation. Respiratory effort normal. Cardiovascular system:  RRR. No  murmurs, rubs, gallops. No pedal edema. GI system: Abdomen is nondistended, soft and nontender. No organomegaly.  Central nervous system. No focal neurological deficits. 5 x 5 power in all extremities. Skin: No rashes, lesions or ulcers. Musculoskeletal- left lower extremity in dressing Psychiatry: Alert, oriented x 3.Judgement and insight appear normal. Affect normal.    Data Reviewed: I have personally reviewed following labs and imaging studies  CBG:  Recent Labs Lab 11/17/16 0147 11/17/16 0650 11/17/16 1131 11/18/16 0817 11/18/16 1243  GLUCAP 107* 100* 86 89 77    CBC:  Recent Labs Lab 11/16/16 1554 11/16/16 1814 11/18/16 0731 11/19/16 0238 11/20/16 0231 11/21/16 0247  WBC 14.0*  --  13.1* 11.3* 14.1* 16.7*  NEUTROABS 10.9*  --   --   --   --   --   HGB 13.1 13.6  13.6 10.5* 11.2* 8.6* 8.7*  HCT 38.9 40.0  40.0 33.4* 34.6* 27.2* 27.3*  MCV 86.3  --  89.1 88.9 88.6 88.9  PLT 349  --  229 195 254 010    Basic Metabolic Panel:  Recent Labs Lab 11/17/16 0117 11/18/16 0302 11/19/16 0238 11/20/16 0231 11/21/16 0247  NA 136 132* 133* 133* 134*  K 5.1 5.1 5.8* 5.6* 4.3  CL 91* 93* 95* 94* 92*  CO2 '23 28 25 26 29  '$ GLUCOSE 110* 114* 92 118* 128*  BUN 41* 19 29* 43* 22*  CREATININE 8.21* 5.00* 6.78* 8.59* 5.30*  CALCIUM 8.8* 8.0* 7.7* 7.5* 8.1*  PHOS  --  5.7*  --   --   --     Recent Results (from the past 240 hour(s))  Surgical pcr screen     Status: Abnormal   Collection Time: 11/17/16  2:39 AM  Result Value Ref Range Status   MRSA, PCR NEGATIVE NEGATIVE Final   Staphylococcus aureus POSITIVE (A) NEGATIVE Final    Comment:         The Xpert SA Assay (FDA approved for NASAL specimens in patients over 36 years of age), is one component of a comprehensive surveillance program.  Test performance has been validated by [facility] for patients greater than or equal to 29 year old. It is not intended to diagnose infection nor to guide or monitor treatment.      Liver Function Tests:  Recent Labs Lab 11/18/16 0302  ALBUMIN 2.3*      Studies: No results found.  Scheduled Meds: . allopurinol  100 mg Oral Daily  . atorvastatin  40 mg Oral Daily  . cholecalciferol  5,000 Units Oral Daily  . cinacalcet  30 mg Oral Q supper  . dextromethorphan-guaiFENesin  1 tablet Oral Daily  . [START ON 11/22/2016] doxercalciferol  2 mcg Intravenous Q T,Th,Sa-HD  . DULoxetine  60 mg Oral Daily  . feeding supplement (NEPRO CARB STEADY)  237 mL Oral BID BM  . heparin  5,000 Units Subcutaneous Q8H  . hydrOXYzine  20 mg Oral Daily  . levothyroxine  175 mcg Oral QAC breakfast  . mouth rinse  15 mL Mouth Rinse BID  . midodrine  10 mg Oral BID WC  . mometasone-formoterol  2 puff Inhalation BID  . montelukast  10 mg Oral QHS  . multivitamin  1 tablet Oral QHS  . mupirocin ointment  1 application Nasal BID  . pantoprazole  40 mg Oral Daily  . pregabalin  50 mg Oral QHS  . QUEtiapine  100 mg Oral QHS  . sevelamer carbonate  3,200 mg Oral TID WC      Time spent: 25 min  [facility] Pager [phone]. If 7PM-7AM, please contact night-coverage at [email or web address], Office  [phone]  password [email or web address] 11/21/2016, 11:59 AM  LOS: 5 days

## 2016-11-22 ENCOUNTER — Inpatient Hospital Stay (HOSPITAL_COMMUNITY): Payer: Medicare Other

## 2016-11-22 DIAGNOSIS — R0602 Shortness of breath: Secondary | ICD-10-CM

## 2016-11-22 LAB — CBC
HCT: 25.2 % — ABNORMAL LOW (ref 36.0–46.0)
HEMOGLOBIN: 7.8 g/dL — AB (ref 12.0–15.0)
MCH: 27.6 pg (ref 26.0–34.0)
MCHC: 31 g/dL (ref 30.0–36.0)
MCV: 89 fL (ref 78.0–100.0)
PLATELETS: 295 10*3/uL (ref 150–400)
RBC: 2.83 MIL/uL — AB (ref 3.87–5.11)
RDW: 18.6 % — ABNORMAL HIGH (ref 11.5–15.5)
WBC: 15.9 10*3/uL — ABNORMAL HIGH (ref 4.0–10.5)

## 2016-11-22 LAB — RENAL FUNCTION PANEL
ALBUMIN: 2 g/dL — AB (ref 3.5–5.0)
ANION GAP: 13 (ref 5–15)
BUN: 42 mg/dL — ABNORMAL HIGH (ref 6–20)
CALCIUM: 8.1 mg/dL — AB (ref 8.9–10.3)
CO2: 28 mmol/L (ref 22–32)
Chloride: 93 mmol/L — ABNORMAL LOW (ref 101–111)
Creatinine, Ser: 7.38 mg/dL — ABNORMAL HIGH (ref 0.44–1.00)
GFR calc Af Amer: 6 mL/min — ABNORMAL LOW (ref >60)
GFR calc non Af Amer: 5 mL/min — ABNORMAL LOW (ref >60)
GLUCOSE: 116 mg/dL — AB (ref 65–99)
PHOSPHORUS: 4.7 mg/dL — AB (ref 2.5–4.6)
Potassium: 4.6 mmol/L (ref 3.5–5.1)
SODIUM: 134 mmol/L — AB (ref 135–145)

## 2016-11-22 MED ORDER — DOXERCALCIFEROL 4 MCG/2ML IV SOLN
INTRAVENOUS | Status: AC
  Administered 2016-11-22: 2 ug via INTRAVENOUS
  Filled 2016-11-22: qty 2

## 2016-11-22 MED ORDER — OXYCODONE HCL 5 MG PO TABS
ORAL_TABLET | ORAL | Status: AC
  Filled 2016-11-22: qty 1

## 2016-11-22 MED ORDER — DARBEPOETIN ALFA 100 MCG/0.5ML IJ SOSY
PREFILLED_SYRINGE | INTRAMUSCULAR | Status: AC
  Administered 2016-11-22: 100 ug via INTRAVENOUS
  Filled 2016-11-22: qty 0.5

## 2016-11-22 MED ORDER — MIDODRINE HCL 5 MG PO TABS
ORAL_TABLET | ORAL | Status: AC
  Filled 2016-11-22: qty 2

## 2016-11-22 MED ORDER — IOPAMIDOL (ISOVUE-370) INJECTION 76%
INTRAVENOUS | Status: AC
  Administered 2016-11-22: 100 mL
  Filled 2016-11-22: qty 100

## 2016-11-22 NOTE — Progress Notes (Signed)
Patient ID: [name] [name], female   DOB: 04-Jun-1941, 76 y.o.   MRN: [id number] Left leg assessed and post-operative dressing removed.  All incisions clean and dry.  Calf soft.  Foot perfused.  New dressings applied.  Will remain non-weight bearing on that left leg for 4-6 weeks.

## 2016-11-22 NOTE — Care Management Note (Signed)
Case Management Note  Patient Details  Name: Malcolm Hetz Trethewey MRN: [id number] Date of Birth: 12-10-1940  Subjective/Objective:   Tibia fx s/p IM nailing on 4/15.                 Action/Plan: PTA pt lived at home- hx of ESRD- on HD-T/T/S-- per PT eval recommendation for SNF- CSW consulted for placement needs   Expected Discharge Date:                  Expected Discharge Plan:  Skilled Nursing Facility  In-House Referral:  Clinical Social Work  Discharge planning Services  CM Consult  Post Acute Care Choice:    Choice offered to:     DME Arranged:    DME Agency:     HH Arranged:    [place] Agency:     Status of Service:  Completed, signed off  If discussed at [employer], dates discussed:    Additional Comments:  Zenon Mayo, RN 11/22/2016, 11:05 PM

## 2016-11-22 NOTE — Progress Notes (Signed)
[facility]  PROGRESS NOTE  [name] [name] INO:[id number] DOB: 1941-05-24 DOA: 11/16/2016 PCP: Cathlean Cower, MD   Brief HPI:   76 year old female with ESRD on HD TTS, hypertension, osteoarthritis, 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 Patient had missed her dialysis session on the day of admission and 2 days ago due to passing of her friend. X-ray of her left leg showed acute comminuted fracture off mid to distal left tibia. BMET showed BUN 95, potassium 6.9. EKG showed peaked T waves. Orthopedics and nephrology were consulted.    Subjective   Patient seen and examined during hemodialysis session, still requiring 4 L /min oxygen via nasal canula.   Assessment/Plan:     1. Left tibia fracture- secondary to mechanical fall, x-ray showed comminuted fracture mid to distal left tibia. Patient was seen by orthopedic surgery. She underwent intramedullary nail placement left tibia. Postop day #3. Patient has a bed available at skilled nursing facility. Will discharge once medically stable. We'll await orthopedics recommendation regarding DVT prophylaxis and postop care with follow-up. 2. Anemia-hemoglobin is 7.8, was 8.7 yesterday. Likely postop blood loss anemia. Will recheck hemoglobin in am and transfuse for Hb less than 8.0. 3. Hyperkalemia- resolved, patient's potassium was 6.9 on admission with peaked T waves noted on EKG, secondary to ESRD with missed hemodialysis. Nephrology was consulted and patient underwent urgent hemodialysis. Today potassium is 4.6 4. Acute hypoxic respiratory failure- patient is consistently requiring 4-5 L of oxygen via nasal cannula. O2 sats dropping to 80's on room air. Previous chest x-ray on 11/16/2016 showed persistent right upper lobe airspace disease. Repeat chest x-ray showed no acute abnormality, and  ABG was normal. Patient has  history of COPD, squamous cell lung cancer. Will obtain CTA chest to rule out pulmonary embolism. 5. ESRD- patient gets dialysis Tuesday Thursday and Saturday, she missed 2 sessions prior to admission. Nephrology following. Patient underwent hemodialysis yesterday on 11/20/2016, next hemodialysis on 11/22/16. 6. COPD- patient denies shortness of breath at this time, continue Dulera , when necessary albuterol. 7. Lung cancer, squamous cell- patient is followed by oncologist at the [facility]. Patient completed chemoradiation and is now undergoing imaging surveillance every 3 months. Per oncology note from February there is no evidence of recurrence and her chronic dry cough is likely due to treatment and her new baseline. 8. GERD- stable, continue daily PPIs 9. Hypothyroidism-stable, continue Synthroid    DVT prophylaxis: Heparin  Code Status: Full code  Family Communication: No family present at bedside   Disposition Plan: Skilled nursing facility   Consultants:  Orthopedics  Procedures:  Intramedullary nail placement in left tibia  Continuous infusions     Antibiotics:   Anti-infectives    Start     Dose/Rate Route Frequency Ordered Stop   11/18/16 1002  clindamycin (CLEOCIN) 900 MG/50ML IVPB    Comments:  [name]   : cabinet override      11/18/16 1002 11/18/16 1015   11/17/16 0800  clindamycin (CLEOCIN) IVPB 900 mg     900 mg 100 mL/hr over 30 Minutes Intravenous To Short Stay 11/17/16 0204 11/18/16 1030       Objective   Vitals:   11/22/16 1015 11/22/16 1045 11/22/16 1100 11/22/16 1116  BP: (!) 99/48 (!) 96/42 (!) 85/44 (!) 107/48  Pulse: 100 98 100 98  Resp:    20  Temp:  98.6 F (37 C)  TempSrc:    Oral  SpO2:    100%  Weight:    71.5 kg (157 lb 10.1 oz)  Height:        Intake/Output Summary (Last 24 hours) at 11/22/16 1519 Last data filed at 11/22/16 1400  Gross per 24 hour  Intake              110 ml  Output             1452  ml  Net            -1342 ml   Filed Weights   11/20/16 1205 11/22/16 0708 11/22/16 1116  Weight: 70.5 kg (155 lb 6.8 oz) 72.7 kg (160 lb 4.4 oz) 71.5 kg (157 lb 10.1 oz)     Physical Examination:  Physical Exam: Eyes: No icterus, extraocular muscles intact  Mouth: Oral mucosa is moist, no lesions on palate,  Neck: Supple, no deformities, masses, or tenderness Lungs: mild tachypnea, bilateral clear to auscultation, no crackles or wheezes.  Heart: Regular rate and rhythm, S1 and S2 normal, no murmurs, rubs auscultated Abdomen: BS normoactive,soft,nondistended,non-tender to palpation,no organomegaly Extremities: No pretibial edema, no erythema, no cyanosis, no clubbing Neuro : Alert and oriented to time, place and person, No focal deficits     Data Reviewed: I have personally reviewed following labs and imaging studies  CBG:  Recent Labs Lab 11/17/16 0147 11/17/16 0650 11/17/16 1131 11/18/16 0817 11/18/16 1243  GLUCAP 107* 100* 86 89 77    CBC:  Recent Labs Lab 11/16/16 1554  11/18/16 0731 11/19/16 0238 11/20/16 0231 11/21/16 0247 11/22/16 0724  WBC 14.0*  --  13.1* 11.3* 14.1* 16.7* 15.9*  NEUTROABS 10.9*  --   --   --   --   --   --   HGB 13.1  < > 10.5* 11.2* 8.6* 8.7* 7.8*  HCT 38.9  < > 33.4* 34.6* 27.2* 27.3* 25.2*  MCV 86.3  --  89.1 88.9 88.6 88.9 89.0  PLT 349  --  229 195 254 268 295  < > = values in this interval not displayed.  Basic Metabolic Panel:  Recent Labs Lab 11/18/16 0302 11/19/16 0238 11/20/16 0231 11/21/16 0247 11/22/16 0725  NA 132* 133* 133* 134* 134*  K 5.1 5.8* 5.6* 4.3 4.6  CL 93* 95* 94* 92* 93*  CO2 '28 25 26 29 28  '$ GLUCOSE 114* 92 118* 128* 116*  BUN 19 29* 43* 22* 42*  CREATININE 5.00* 6.78* 8.59* 5.30* 7.38*  CALCIUM 8.0* 7.7* 7.5* 8.1* 8.1*  PHOS 5.7*  --   --   --  4.7*    Recent Results (from the past 240 hour(s))  Surgical pcr screen     Status: Abnormal   Collection Time: 11/17/16  2:39 AM  Result Value  Ref Range Status   MRSA, PCR NEGATIVE NEGATIVE Final   Staphylococcus aureus POSITIVE (A) NEGATIVE Final    Comment:        The Xpert SA Assay (FDA approved for NASAL specimens in patients over 106 years of age), is one component of a comprehensive surveillance program.  Test performance has been validated by [facility] for patients greater than or equal to 37 year old. It is not intended to diagnose infection nor to guide or monitor treatment.      Liver Function Tests:  Recent Labs Lab 11/18/16 0302 11/22/16 0725  ALBUMIN 2.3* 2.0*      Studies: Dg Chest  Port 1 View  Result Date: 11/21/2016 CLINICAL DATA:  Hypoxemia EXAM: PORTABLE CHEST 1 VIEW COMPARISON:  11/16/2016, 05/29/2016 FINDINGS: Cardiac shadow is stable. Aortic calcifications are again seen. Chronic scarring in the right apex is again seen stable from the prior exam consistent with the known history of radiation therapy. No focal infiltrate or sizable effusion is noted. No acute bony abnormality is seen. IMPRESSION: Chronic changes in the right apex.  No acute abnormality noted. Electronically Signed   By: Inez Catalina M.D.   On: 11/21/2016 12:21    Scheduled Meds: . iopamidol      . allopurinol  100 mg Oral Daily  . atorvastatin  40 mg Oral Daily  . cholecalciferol  5,000 Units Oral Daily  . cinacalcet  30 mg Oral Q supper  . darbepoetin (ARANESP) injection - DIALYSIS  100 mcg Intravenous Q Thu-HD  . dextromethorphan-guaiFENesin  1 tablet Oral Daily  . doxercalciferol  2 mcg Intravenous Q T,Th,Sa-HD  . DULoxetine  60 mg Oral Daily  . feeding supplement (NEPRO CARB STEADY)  237 mL Oral BID BM  . heparin  5,000 Units Subcutaneous Q8H  . hydrOXYzine  20 mg Oral Daily  . levothyroxine  175 mcg Oral QAC breakfast  . mouth rinse  15 mL Mouth Rinse BID  . midodrine      . midodrine  10 mg Oral BID WC  . mometasone-formoterol  2 puff Inhalation BID  . montelukast  10 mg Oral QHS  . multivitamin  1 tablet  Oral QHS  . mupirocin ointment  1 application Nasal BID  . oxyCODONE      . pantoprazole  40 mg Oral Daily  . pregabalin  50 mg Oral QHS  . QUEtiapine  100 mg Oral QHS  . sevelamer carbonate  3,200 mg Oral TID WC      Time spent: 25 min  [place] [facility] Pager [phone]. If 7PM-7AM, please contact night-coverage at [email or web address], Office  [phone]  password [email or web address] 11/22/2016, 3:19 PM  LOS: 6 days

## 2016-11-22 NOTE — Procedures (Signed)
No c/o's, on HD. BP's low on HD, trying for 4 L but not sure we will get it.  Had hypoxic episode yesterday, will see if pulling more fluid improves this.  Have d/w primary MD.  CXR 4/18 was clear.  May be positional , she is usually lying on her side.     I was present at this dialysis session, have reviewed the session itself and made  appropriate changes Kelly Splinter MD [place] pager [phone]   11/22/2016, 12:03 PM

## 2016-11-23 DIAGNOSIS — J470 Bronchiectasis with acute lower respiratory infection: Secondary | ICD-10-CM

## 2016-11-23 DIAGNOSIS — J9601 Acute respiratory failure with hypoxia: Secondary | ICD-10-CM

## 2016-11-23 DIAGNOSIS — J449 Chronic obstructive pulmonary disease, unspecified: Secondary | ICD-10-CM

## 2016-11-23 LAB — RENAL FUNCTION PANEL
ALBUMIN: 2 g/dL — AB (ref 3.5–5.0)
Anion gap: 11 (ref 5–15)
BUN: 28 mg/dL — AB (ref 6–20)
CALCIUM: 7.9 mg/dL — AB (ref 8.9–10.3)
CO2: 29 mmol/L (ref 22–32)
CREATININE: 5.27 mg/dL — AB (ref 0.44–1.00)
Chloride: 93 mmol/L — ABNORMAL LOW (ref 101–111)
GFR calc Af Amer: 8 mL/min — ABNORMAL LOW (ref >60)
GFR calc non Af Amer: 7 mL/min — ABNORMAL LOW (ref >60)
GLUCOSE: 136 mg/dL — AB (ref 65–99)
PHOSPHORUS: 3.5 mg/dL (ref 2.5–4.6)
Potassium: 3.9 mmol/L (ref 3.5–5.1)
Sodium: 133 mmol/L — ABNORMAL LOW (ref 135–145)

## 2016-11-23 LAB — CBC
HCT: 24 % — ABNORMAL LOW (ref 36.0–46.0)
Hemoglobin: 7.5 g/dL — ABNORMAL LOW (ref 12.0–15.0)
MCH: 28.3 pg (ref 26.0–34.0)
MCHC: 31.3 g/dL (ref 30.0–36.0)
MCV: 90.6 fL (ref 78.0–100.0)
Platelets: 332 10*3/uL (ref 150–400)
RBC: 2.65 MIL/uL — ABNORMAL LOW (ref 3.87–5.11)
RDW: 18.9 % — ABNORMAL HIGH (ref 11.5–15.5)
WBC: 17.6 10*3/uL — ABNORMAL HIGH (ref 4.0–10.5)

## 2016-11-23 LAB — PROCALCITONIN: PROCALCITONIN: 3.88 ng/mL

## 2016-11-23 MED ORDER — MIDODRINE HCL 5 MG PO TABS
ORAL_TABLET | ORAL | Status: AC
  Administered 2016-11-23: 10 mg via ORAL
  Filled 2016-11-23: qty 2

## 2016-11-23 MED ORDER — MIDODRINE HCL 5 MG PO TABS
10.0000 mg | ORAL_TABLET | Freq: Three times a day (TID) | ORAL | Status: DC
  Administered 2016-11-23 – 2016-11-24 (×5): 10 mg via ORAL
  Filled 2016-11-23 (×3): qty 2

## 2016-11-23 MED ORDER — OXYCODONE HCL 5 MG PO TABS
ORAL_TABLET | ORAL | Status: AC
  Filled 2016-11-23: qty 1

## 2016-11-23 NOTE — Plan of Care (Signed)
Problem: Pain Managment: Goal: General experience of comfort will improve Outcome: Not Progressing Complains of 10/10 pain, unable to give a lot of pain medication d/t low BP and drop in O2 with sedating medications.  Problem: Physical Regulation: Goal: Ability to maintain clinical measurements within normal limits will improve Outcome: Not Progressing BP low

## 2016-11-23 NOTE — Progress Notes (Signed)
Occupational Therapy Treatment Patient Details Name: Eileen Kangas Kienle MRN: [id number] DOB: [birth year] Today's Date: 11/23/2016    History of present illness 76 y.o. 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    OT comments  Pt seen to progress mobility as a precursor to ADL. Pt with poor activity tolerance and symptomatic orthostasis upon sitting at EOB and attempt to stand requiring return to supine. Pt anxious with mobility. 02 sats remained in the 90s on RA during activity, returned 02 at end of session at RNs request. Pt continues to be appropriate for further rehab in SNF.  Follow Up Recommendations  SNF    Equipment Recommendations  3 in 1 bedside commode;Wheelchair (measurements OT);Wheelchair cushion (measurements OT);Hospital bed    Recommendations for Other Services      Precautions / Restrictions Precautions Precautions: Fall Required Braces or Orthoses: Other Brace/Splint (CAM boot) Restrictions Weight Bearing Restrictions: Yes LLE Weight Bearing: Non weight bearing       Mobility Bed Mobility Overal bed mobility: Needs Assistance Bed Mobility: Supine to Sit;Sit to Supine     Supine to sit: Mod assist Sit to supine: Total assist;+2 for physical assistance;+2 for safety/equipment   General bed mobility comments: pt required increased time, VC'ing for sequencing and technique, use of bed rails, mod A with L LE movement and to elevate trunk with use of bed pads to position pt's hips at EOB. Total A x2 to return to supine   Transfers Overall transfer level: Needs assistance Equipment used: Rolling walker (2 wheeled) Transfers:  Sit to/from Stand Sit to Stand: Max assist;+2 physical assistance;+2 safety/equipment         General transfer comment: pt required max A x2 with use of gait belt and bed pads to rise into standing from the bed. Pt was able to maintain NWB L LE throughout; however, she was unable to assume and full erect standing position. She became anxious and sat back down. Further attempts deferred at this time as pt with symptomatic orthostatic hypotension, pt's BP decreased to 86/43 sitting EOB.    Balance Overall balance assessment: Needs assistance;History of Falls Sitting-balance support: Feet supported;Bilateral upper extremity supported Sitting balance-Leahy Scale: Poor Sitting balance - Comments: pt progressing from requiring mod A to very close min guard with bilateral UE supports. Pt became symptomatic sitting EOB with decrease in BP to 86/43. After returning to supine, pt's BP increased to 94/45.       Standing balance comment: unable to achieve standing                           ADL either performed or assessed with clinical judgement   ADL                                               Vision       Perception     Praxis      Cognition Arousal/Alertness: Awake/alert Behavior During Therapy: Anxious Overall Cognitive Status: Impaired/Different from baseline Area of Impairment: Following commands;Safety/judgement;Problem solving  Following Commands: Follows one step commands with increased time Safety/Judgement: Decreased awareness of safety   Problem Solving: Decreased initiation;Difficulty sequencing;Requires verbal cues          Exercises     Shoulder Instructions       General Comments      Pertinent Vitals/ Pain       Pain Assessment: 0-10 Pain Score: 9  Pain Location: left LE Pain Descriptors / Indicators: Grimacing;Guarding;Moaning Pain Intervention(s): Monitored during session;Ice  applied;Repositioned  Home Living                                          Prior Functioning/Environment              Frequency  Min 2X/week        Progress Toward Goals  OT Goals(current goals can now be found in the care plan section)  Progress towards OT goals: Not progressing toward goals - comment (due to hypotension)  Acute Rehab OT Goals Patient Stated Goal: to not hurt Time For Goal Achievement: 12/03/16 Potential to Achieve Goals: Good  Plan Discharge plan remains appropriate    Co-evaluation    PT/OT/SLP Co-Evaluation/Treatment: Yes Reason for Co-Treatment: For patient/therapist safety PT goals addressed during session: Mobility/safety with mobility;Balance OT goals addressed during session: Strengthening/ROM      End of Session Equipment Utilized During Treatment: Oxygen;Gait belt;Rolling walker  OT Visit Diagnosis: Unsteadiness on feet (R26.81);Other symptoms and signs involving cognitive function;History of falling (Z91.81);Hemiplegia and hemiparesis   Activity Tolerance Treatment limited secondary to medical complications (Comment) (hypotension)   Patient Left in bed;with call bell/phone within reach;with bed alarm set   Nurse Communication Other (comment) (aware of hypotension, 02 sats remained in 90s on RA)        Time: 1740-8144 OT Time Calculation (min): 26 min  Charges: OT General Charges $OT Visit: 1 Procedure OT Treatments $Therapeutic Activity: 8-22 mins    Malka So 11/23/2016, 10:09 AM  [phone]

## 2016-11-23 NOTE — Consult Note (Signed)
Name: [name] [name] MRN: [id number] DOB: Jan 07, 1941    ADMISSION DATE:  11/16/2016 CONSULTATION DATE:  11/23/2016  REFERRING MD :  Darrick Meigs  CHIEF COMPLAINT:  Acute on chronic Hypoxic Respiratory Failure  BRIEF PATIENT DESCRIPTION:  Elderly female, supine in bed wearing nasal oxygen at 2 L, saturating 97%, in no distress.  SIGNIFICANT EVENTS  11/18/2016>> sp ORIF 4/15  STUDIES:  CTA:11/22/2016 No pulmonary embolism. 2. New patchy consolidation and ground-glass attenuation in the dependent peripheral left upper lobe and mild patchy ground-glass opacity in the right middle lobe, suggestive of an infectious or inflammatory pneumonia. 3. Masslike fibrosis in the right upper lobe is compatible with radiation change. No definite findings of local tumor recurrence or metastatic disease in the chest. Continued chest CT surveillance is recommended   CXR 11/21/2016 Cardiac shadow is stable. Aortic calcifications are again seen. Chronic scarring in the right apex is again seen stable from the prior exam consistent with the known history of radiation therapy. No focal infiltrate or sizable effusion is noted. No acute bony abnormality is seen.  IMPRESSION: Chronic changes in the right apex.  No acute abnormality noted.  HISTORY OF PRESENT ILLNESS:    76 year old female with  A 51 pack year smoking history, Quit smoking 11/2015, ESRD on HD TTS, hypertension, osteoarthritis, 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 Patient had missed her dialysis session on the day of admission and 2 days ago due to passing of her friend. X-ray of her left leg showed acute comminuted fracture off mid to distal left tibia. BMET showed BUN 95, potassium 6.9. EKG showed peaked T waves. Orthopedics and nephrology were consulted.She underwent intra-medullary tibial nail placement 4/14. She started  desaturating on RA 4/17-4/18, requiring 4-5 L oxygen [place]. She has not required oxygen previously.She carries a diagnosis of COPD, and is maintained on Dulera and Singulair. She states she is compliant with these mediations. She states she has a Science writer that she rarely uses. She states she does not use any nebs at home, but Albuterol nebs are on her home med list. CCM has been asked to consult for acute n chronic respiratory Failure.  PAST MEDICAL HISTORY :   has a past medical history of ANEMIA-NOS (05/29/2007); ANXIETY (03/23/2010); Arthritis; Cancer of kidney ([place]) (10/07/2012); Chronic bronchitis ([place]); Chronic lower back pain; Chronic neck pain (12/13/2010); Chronic sciatica (12/13/2010); CIGARETTE SMOKER (09/17/2007); COMMON MIGRAINE; Complication of anesthesia; Critical lower limb ischemia; DEPRESSION (02/17/2007); ESRD (end stage renal disease) on dialysis [facility]); ESRD on hemodialysis ([place]) (02/17/2007); GERD (gastroesophageal reflux disease); GOUT (05/29/2007); Heart murmur (10/02/2010); HYPERLIPIDEMIA (02/17/2007); HYPERTENSION (02/17/2007); HYPOTHYROIDISM (02/17/2007); Memory loss (01/24/2010); OSTEOPENIA (09/22/2009); Palpitations (09/08/2010); PEPTIC ULCER DISEASE (05/29/2007); PERIPHERAL NEUROPATHY (05/29/2007); PERIPHERAL VASCULAR DISEASE (02/17/2007); Personal history of colonic polyps (11/16/2009); Renal insufficiency; RESTLESS LEG SYNDROME (05/29/2007); SEIZURE DISORDER (02/17/2007); Type II diabetes mellitus ([place]) (02/17/2007); and Vocal cord paralysis (1996).  has a past surgical history that includes Toe amputation (Left, 2006); Bunionectomy (Bilateral, 1980); Thyroid surgery (1997); stress Cardiolite (06/18/2006); tranthoracic echocardiogram (06/18/2006); electrocardiogram (05/29/2006); Shoulder open rotator cuff repair (Left); AV fistula placement (03/13/2012); Cataract extraction, bilateral (Bilateral); Esophagoscopy w/botox injection (07/22/2012); Insertion of dialysis catheter (N/A, 02/05/2013); Toe  amputation (Right, Aug. 2015); Laparoscopic cholecystectomy; Video bronchoscopy (Bilateral, 10/25/2015); A/V Fistulagram (N/A, 10/22/2016); Peripheral Vascular Balloon Angioplasty (Right, 10/22/2016); and Tibia IM nail insertion (Left, 11/18/2016). Prior to Admission medications   Medication Sig Start  Date End Date Taking? Authorizing Provider  albuterol (PROVENTIL HFA;VENTOLIN HFA) 108 (90 BASE) MCG/ACT inhaler Inhale 2 puffs into the lungs every 6 (six) hours as needed for wheezing or shortness of breath. 06/15/15  Yes Biagio Borg, MD  allopurinol (ZYLOPRIM) 100 MG tablet TAKE ONE TABLET BY MOUTH ONCE DAILY 05/11/16  Yes Biagio Borg, MD  atorvastatin (LIPITOR) 40 MG tablet TAKE ONE TABLET BY MOUTH ONCE DAILY 04/01/15  Yes Biagio Borg, MD  budesonide-formoterol [facility]) 160-4.5 MCG/ACT inhaler Inhale 2 puffs into the lungs 2 (two) times daily. Wait 1 min in between puffs, rinse mouth after use   Yes Historical Provider, MD  cetirizine (ZYRTEC) 10 MG tablet Take 10 mg by mouth daily.   Yes Historical Provider, MD  Cholecalciferol (VITAMIN D-3) 5000 units TABS Take 1 tablet by mouth daily.   Yes Historical Provider, MD  cinacalcet (SENSIPAR) 30 MG tablet Take 30 mg by mouth daily with supper.   Yes Historical Provider, MD  clonazePAM (KLONOPIN) 1 MG tablet Take 1 tablet (1 mg total) by mouth daily. Patient taking differently: Take 1 mg by mouth 2 (two) times daily as needed for anxiety.  01/13/16  Yes Biagio Borg, MD  colchicine 0.6 MG tablet Take 0.5 tablets (0.3 mg total) by mouth 2 (two) times a week. 10/28/15  Yes Dixie Dials, MD  cyclobenzaprine (FLEXERIL) 10 MG tablet TAKE ONE TABLET BY MOUTH THREE TIMES DAILY AS NEEDED FOR MUSCLE SPASM 03/09/16  Yes Biagio Borg, MD  darbepoetin [facility]) 25 MCG/0.42ML SOLN injection Inject 25 mcg into the vein every 7 (seven) days. Every Thursday at dialysis   Yes Historical Provider, MD  dextromethorphan-guaiFENesin [facility]) 30-600 MG 12hr tablet Take 1 tablet  by mouth 2 (two) times daily as needed for cough.    Yes Historical Provider, MD  doxercalciferol (HECTOROL) 4 MCG/2ML injection Inject 1 mL (2 mcg total) into the vein Every Tuesday,Thursday,and Saturday with dialysis. 10/28/15  Yes Dixie Dials, MD  DULoxetine (CYMBALTA) 30 MG capsule Take 2 capsules (60 mg total) by mouth daily. 05/01/16  Yes Biagio Borg, MD  hydrOXYzine (ATARAX/VISTARIL) 10 MG tablet Take 20 mg by mouth daily.    Yes Historical Provider, MD  Lactulose 20 GM/30ML SOLN Take 30 mLs by mouth daily as needed.    Yes Historical Provider, MD  levothyroxine (SYNTHROID, LEVOTHROID) 175 MCG tablet Take 1 tablet (175 mcg total) by mouth daily before breakfast. 03/02/15  Yes Biagio Borg, MD  mometasone-formoterol [facility]) 100-5 MCG/ACT AERO Inhale 2 puffs into the lungs 2 (two) times daily.   Yes Historical Provider, MD  montelukast (SINGULAIR) 10 MG tablet TAKE ONE TABLET BY MOUTH AT BEDTIME. SCHEDULE APPOINTMENT FOR YEARLY PHYSICAL WITH LABS. MUST SEE DOCTOR FOR REFILLS 08/21/16  Yes Biagio Borg, MD  multivitamin (RENA-VIT) TABS tablet Take 1 tablet by mouth daily.   Yes Historical Provider, MD  omeprazole (PRILOSEC) 40 MG capsule Take 1 capsule (40 mg total) by mouth daily. 01/13/16  Yes Biagio Borg, MD  ondansetron (ZOFRAN) 8 MG tablet Take 1 tablet (8 mg total) by mouth every 6 (six) hours as needed for nausea or vomiting. 01/13/16  Yes Biagio Borg, MD  OVER THE COUNTER MEDICATION Take 1 capsule by mouth 2 (two) times daily. Beet Root 1000 mg per cap   Yes Historical Provider, MD  OVER THE COUNTER MEDICATION 8 drops by Other route daily. Places 8 drops in 1 bottle of water  "Cell Food"  Yes Historical Provider, MD  oxyCODONE (OXY IR/ROXICODONE) 5 MG immediate release tablet Take 5 mg by mouth every 8 (eight) hours as needed for pain. 11/05/16  Yes Historical Provider, MD  pregabalin (LYRICA) 50 MG capsule Take 50 mg by mouth at bedtime.    Yes Historical Provider, MD  QUEtiapine (SEROQUEL)  100 MG tablet Take 1 tablet (100 mg total) by mouth at bedtime. 05/01/16  Yes Biagio Borg, MD  sevelamer carbonate (RENVELA) 800 MG tablet Take 2,400-3,200 mg by mouth See admin instructions. 4 tabs with meals, 3 tabs with snacks   Yes Historical Provider, MD  Turmeric Curcumin 500 MG CAPS Take 500 mg by mouth daily.   Yes Historical Provider, MD   Allergies  Allergen Reactions  . Cephalosporins Itching and Rash    PATIENT DENIES THIS REACTION - Vanc and fortaz given at the same time in June for several doses at dialysis with systemic rash and itching; received zinacef 7/5 and had worseningsystemicrash/ itching and swelling of eyes - so unclear if allergic to either or both  . Nsaids Other (See Comments)    Renal dysfunction  . Pioglitazone Swelling    PATIENT DENIES THIS REACTON - edema  . Adhesive [Tape] Rash  . Vancomycin Rash    PATIENT DENIES THIS REACTON - See comment under cephalosporin    FAMILY HISTORY:  family history includes Coronary artery disease in her other; Dementia in her mother; Heart attack in her brother; Hyperlipidemia in her other; Hypertension in her brother, mother, other, and sister; Ovarian cancer in her other; Stroke in her brother and other. SOCIAL HISTORY:  reports that she has quit smoking. Her smoking use included Cigarettes. She has a 12.75 pack-year smoking history. She has never used smokeless tobacco. She reports that she does not drink alcohol or use drugs.  REVIEW OF SYSTEMS:   Constitutional: Negative for fever, chills, weight loss, malaise/fatigue and diaphoresis.  HENT: Negative for hearing loss, ear pain, nosebleeds, congestion, sore throat, neck pain, tinnitus and ear discharge.   Eyes: Negative for blurred vision, double vision, photophobia, pain, discharge and redness.  Respiratory: + for cough,No  hemoptysis, +sputum production, +shortness of breath, No wheezing and stridor.   Cardiovascular: Negative for chest pain, palpitations, orthopnea,  claudication, leg swelling and PND.  Gastrointestinal: Negative for heartburn, nausea, vomiting, abdominal pain, diarrhea, constipation, blood in stool and melena.  Genitourinary: Negative for dysuria, urgency, frequency, hematuria and flank pain.  Musculoskeletal: Negative for myalgias, back pain, joint pain and+ falls.  Skin: Negative for itching and rash.  Neurological: Negative for dizziness, tingling, tremors, sensory change, speech change, focal weakness, seizures, loss of consciousness, weakness and headaches.  Endo/Heme/Allergies: Negative for environmental allergies and polydipsia. Does not bruise/bleed easily.  SUBJECTIVE: Pt. States her breathing is better today, but she still needs her oxygen with activity.  VITAL SIGNS: Temp:  [97.7 F (36.5 C)-99.5 F (37.5 C)] 98.9 F (37.2 C) (04/20 0800) Pulse Rate:  [94-101] 97 (04/20 0800) Resp:  [15-20] 15 (04/20 0800) BP: (74-125)/(42-62) 110/47 (04/20 0800) SpO2:  [99 %-100 %] 99 % (04/20 0957)  PHYSICAL EXAMINATION: General:  Alert and oriented x 3, MAE x 4, in NAD, pleasant and talking on the phone. Neuro:  No focal deficits, MAE x 4, A&O x 3, appropriate, no icterus HEENT: Normocephalic, atraumatic. MM pink and moist Cardiovascular:RRR, S1, S2, no rub murmur or gallop noted , no edema  Lungs: Clear to auscultation, no rales or wheezing, diminished per bases. Abdomen: soft, flat,  non-distended, BS +, non-tender to palpation Musculoskeletal: no acute deformities noted, muscle atrophy, frail Skin: warm dry and intact without rash or lesion noted.   Recent Labs Lab 11/20/16 0231 11/21/16 0247 11/22/16 0725  NA 133* 134* 134*  K 5.6* 4.3 4.6  CL 94* 92* 93*  CO2 '26 29 28  '$ BUN 43* 22* 42*  CREATININE 8.59* 5.30* 7.38*  GLUCOSE 118* 128* 116*    Recent Labs Lab 11/20/16 0231 11/21/16 0247 11/22/16 0724  HGB 8.6* 8.7* 7.8*  HCT 27.2* 27.3* 25.2*  WBC 14.1* 16.7* 15.9*  PLT 254 268 295   Ct Angio Chest Pe W Or Wo  Contrast  Result Date: 11/22/2016 CLINICAL DATA:  Inpatient. Acute hypoxic respiratory failure. 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 EXAM: CT ANGIOGRAPHY CHEST WITH CONTRAST TECHNIQUE: Multidetector CT imaging of the chest was performed using the standard protocol during bolus administration of intravenous contrast. Multiplanar CT image reconstructions and MIPs were obtained to evaluate the vascular anatomy. CONTRAST:  100 cc Isovue 370 IV. COMPARISON:  Chest radiograph from one day prior. 11/18/2015 PET-CT. 10/22/2015 chest CT. FINDINGS: Cardiovascular: The study is high quality for the evaluation of pulmonary embolism. There are no filling defects in the central, lobar, segmental or subsegmental pulmonary artery branches to suggest acute pulmonary embolism. Atherosclerotic nonaneurysmal thoracic aorta. Normal caliber pulmonary arteries. Normal heart size. No significant pericardial fluid/thickening. Left main, left anterior descending, left circumflex and right coronary atherosclerosis. Mediastinum/Nodes: Status post total thyroidectomy. Mildly patulous and otherwise unremarkable thoracic esophagus. No axillary adenopathy. No pathologically enlarged mediastinal or hilar nodes. Lungs/Pleura: No pneumothorax. No pleural effusion. Mild centrilobular emphysema with mild diffuse bronchial wall thickening. Masslike fibrosis in the right upper lobe measuring up to 6.0 x 3.9 cm (series 7/ image 25) with associated volume loss and distortion, compatible with post radiation change. Subsegmental bilateral lower lobe atelectasis. There is new patchy consolidation and ground-glass attenuation in the peripheral dependent left upper lobe. Mild patchy ground-glass opacity in the right middle lobe is new. Upper abdomen: Cholecystectomy.  Symmetric renal atrophy. Musculoskeletal: No aggressive appearing focal osseous lesions. Mild thoracic  spondylosis. Review of the MIP images confirms the above findings. IMPRESSION: 1. No pulmonary embolism. 2. New patchy consolidation and ground-glass attenuation in the dependent peripheral left upper lobe and mild patchy ground-glass opacity in the right middle lobe, suggestive of an infectious or inflammatory pneumonia. 3. Masslike fibrosis in the right upper lobe is compatible with radiation change. No definite findings of local tumor recurrence or metastatic disease in the chest. Continued chest CT surveillance is recommended . 4. Aortic atherosclerosis. Left main and 3 vessel coronary atherosclerosis. Electronically Signed   By: Ilona Sorrel M.D.   On: 11/22/2016 16:26   Dg Chest Port 1 View  Result Date: 11/21/2016 CLINICAL DATA:  Hypoxemia EXAM: PORTABLE CHEST 1 VIEW COMPARISON:  11/16/2016, 05/29/2016 FINDINGS: Cardiac shadow is stable. Aortic calcifications are again seen. Chronic scarring in the right apex is again seen stable from the prior exam consistent with the known history of radiation therapy. No focal infiltrate or sizable effusion is noted. No acute bony abnormality is seen. IMPRESSION: Chronic changes in the right apex.  No acute abnormality noted. Electronically Signed   By: Inez Catalina M.D.   On: 11/21/2016 12:21    ASSESSMENT / PLAN:  Acute on Chronic Respiratory Failure: Multifactorial: Fluid overload ( Missed HD up 2 KG), ? COPD exacerbation/ New patchy consolidation/  ground glass attenuation LUL, mild ground glass opacity RML suggestive of infectious or inflammatory pneumonia./ Decreased mobility due to fall, surgery and non-weight bearing status x 4-6 weeks post op. Leukocytosis/ Afebrile Squamous Cell Carcinoma RUL 10/2015>> Treated with radiation Yellow secretions, thick, per patient coughs them up 3 x daily  Plan: Titrate oxygen for saturations of 88-94% Continue Albuterol nebs Q 6 prn Continue Dulera and Singulair Continue Mucinex for mucillary clearance Add Flutter  Valve 4x4 while awake Add Incentive spirometry 4 x hourly while awake Mobilize as able, OOB to chair with assist Consider Doxycycline 100 mg BID  Consider prednisone taper Sputum culture as able Follow WBC and Fever curve CXR prn PCT to determine if infectious vs inflammatory. HD to dry weight  Hypotension: Blood Pressure is soft MAP Goal is > 65  Anemia Transfuse for HGB < 7 CBC daily ESA per renal    Magdalen Spatz, AGACNP-BC [place] Pager # [phone] 11/23/2016, 12:05 PM

## 2016-11-23 NOTE — Progress Notes (Signed)
Physical Therapy Treatment Patient Details Name: [name] [name] MRN: [id number] DOB: 1941/01/09 Today's Date: 11/23/2016    History of Present Illness 76 y.o. 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     PT Comments    Pt presented supine in bed with HOB elevated, awake and willing to participate in therapy session. Pt with limitations this session secondary to pain and symptomatic orthostatic hypotension sitting EOB (see below for details). Pt continues to require two person assistance with mobility. Pt would continue to benefit from skilled physical therapy services at this time while admitted and after d/c to address the below listed limitations in order to improve overall safety and independence with functional mobility.    Follow Up Recommendations  SNF;Supervision/Assistance - 24 hour     Equipment Recommendations  None recommended by PT    Recommendations for Other Services       Precautions / Restrictions Precautions Precautions: Fall Restrictions Weight Bearing Restrictions: Yes LLE Weight Bearing: Non weight bearing    Mobility  Bed Mobility Overal bed mobility: Needs Assistance Bed Mobility: Supine to Sit;Sit to Supine     Supine to sit: Mod assist Sit to supine: Total assist;+2 for physical assistance;+2 for safety/equipment   General bed mobility comments: pt required increased time, VC'ing for sequencing and technique, use of bed rails, mod A with L LE movement and to elevate trunk with use of bed pads to position pt's hips at EOB. Total A x2 to return to supine   Transfers Overall transfer level: Needs  assistance Equipment used: Rolling walker (2 wheeled) Transfers: Sit to/from Stand Sit to Stand: Max assist;+2 physical assistance;+2 safety/equipment         General transfer comment: pt required max A x2 with use of gait belt and bed pads to rise into standing from the bed. Pt was able to maintain NWB L LE throughout; however, she was unable to assume and full erect standing position. She became anxious and sat back down. Further attempts deferred at this time as pt with symptomatic orthostatic hypotension, pt's BP decreased to 86/43 sitting EOB.  Ambulation/Gait                 Stairs            Wheelchair Mobility    Modified Rankin (Stroke Patients Only)       Balance Overall balance assessment: Needs assistance;History of Falls Sitting-balance support: Feet supported;Bilateral upper extremity supported Sitting balance-Leahy Scale: Poor Sitting balance - Comments: pt progressing from requiring mod A to very close min guard with bilateral UE supports. Pt became symptomatic sitting EOB with decrease in BP to 86/43. After returning to supine, pt's BP increased to 94/45.                                    Cognition Arousal/Alertness: Awake/alert Behavior During Therapy: Anxious Overall Cognitive Status: Impaired/Different from baseline Area of Impairment: Following commands;Safety/judgement;Problem solving                       Following Commands: Follows one step commands with increased time Safety/Judgement: Decreased  awareness of safety   Problem Solving: Decreased initiation;Difficulty sequencing;Requires verbal cues        Exercises      General Comments        Pertinent Vitals/Pain Pain Assessment: 0-10 Pain Score: 9  Pain Location: left LE Pain Descriptors / Indicators: Grimacing;Guarding;Moaning Pain Intervention(s): Monitored during session;Repositioned;Ice applied    Home Living                       Prior Function            PT Goals (current goals can now be found in the care plan section) Acute Rehab PT Goals PT Goal Formulation: With patient Time For Goal Achievement: 12/03/16 Potential to Achieve Goals: Fair Progress towards PT goals: Not progressing toward goals - comment (pt with symptomatic orthostatic hypotension)    Frequency    Min 3X/week      PT Plan Current plan remains appropriate    Co-evaluation PT/OT/SLP Co-Evaluation/Treatment: Yes Reason for Co-Treatment: For patient/therapist safety;To address functional/ADL transfers PT goals addressed during session: Mobility/safety with mobility;Balance       End of Session Equipment Utilized During Treatment: Gait belt;Oxygen Activity Tolerance: Patient limited by pain;Other (comment) (limited secondary to orthostatic hypotension) Patient left: in bed;with call bell/phone within reach;with bed alarm set Nurse Communication: Mobility status;Other (comment) (pt's BP and supplemental O2 needs) PT Visit Diagnosis: History of falling (Z91.81);Pain Pain - Right/Left: Left Pain - part of body: Leg     Time: 6808-8110 PT Time Calculation (min) (ACUTE ONLY): 28 min  Charges:  $Therapeutic Activity: 8-22 mins                    G Codes:       [place], [place], [place] 11/23/2016, 9:49 AM

## 2016-11-23 NOTE — Care Management Important Message (Signed)
Important Message  Patient Details  Name: [name] [name] MRN: [id number] Date of Birth: 10-Dec-1940   Medicare Important Message Given:  Yes    Madalynne Gutmann Montine Circle 11/23/2016, 3:32 PM

## 2016-11-23 NOTE — Progress Notes (Signed)
[facility] Progress Note   Subjective:  No new c/o    Vitals:   11/22/16 2302 11/22/16 2357 11/23/16 0339 11/23/16 0800  BP: (!) 94/55 (!) 92/50 (!) 74/42 (!) 110/47  Pulse: (!) 101 94 100 97  Resp: '20 17 19 15  '$ Temp: 98.7 F (37.1 C) 98.7 F (37.1 C) 97.7 F (36.5 C) 98.9 F (37.2 C)  TempSrc: Oral Oral Axillary Oral  SpO2: 100% 100% 100% 100%  Weight:      Height:        Inpatient medications: . allopurinol  100 mg Oral Daily  . atorvastatin  40 mg Oral Daily  . cholecalciferol  5,000 Units Oral Daily  . cinacalcet  30 mg Oral Q supper  . darbepoetin (ARANESP) injection - DIALYSIS  100 mcg Intravenous Q Thu-HD  . dextromethorphan-guaiFENesin  1 tablet Oral Daily  . doxercalciferol  2 mcg Intravenous Q T,Th,Sa-HD  . DULoxetine  60 mg Oral Daily  . feeding supplement (NEPRO CARB STEADY)  237 mL Oral BID BM  . heparin  5,000 Units Subcutaneous Q8H  . hydrOXYzine  20 mg Oral Daily  . levothyroxine  175 mcg Oral QAC breakfast  . mouth rinse  15 mL Mouth Rinse BID  . midodrine  10 mg Oral TID WC  . mometasone-formoterol  2 puff Inhalation BID  . montelukast  10 mg Oral QHS  . multivitamin  1 tablet Oral QHS  . pantoprazole  40 mg Oral Daily  . pregabalin  50 mg Oral QHS  . QUEtiapine  100 mg Oral QHS  . sevelamer carbonate  3,200 mg Oral TID WC    albuterol, clonazePAM, cyclobenzaprine, HYDROcodone-acetaminophen, lip balm, morphine injection, ondansetron (ZOFRAN) IV, oxyCODONE, polyethylene glycol, sevelamer carbonate  Exam: Frail edlerly AAF, alert No jvd Chest clear bilat RRR no RG Abd soft ntnd LLE wrapped ace bandage, bilat old toe amps RUA AVF+bruit NF, ox 2  Dialysis: TTS [facility] 3h 37mn  69.5kg   R AVF   Hep 1200 - Hect 6 ug - last Mircera 2/19 - Venofer 50/wk      Assessment: 1. Tib/ fib fracture LLE - sp ORIF 4/15 2. Hypoxemia - CTA neg for PE, she has ground-glass changes which could be fluid though 3. Volume- is 2kg up still  and may need lower dry wt  4. ESRD TTS HD 5. Anemia / CKD - Hb 13  > 10> 8-9 here, resuming ESA w darbe here at 100ug /wk; check Fe 6. MBD/ CKD - cont hect, sensipar/ renvela 7. Hypotension - started midodrine, will ^10 mg tid 8. Hx R lung [place] cancer - s/p chemoradiation, f/b [facility]  Plan - extra HD today, get vol down, lower BP threshold, added midodrine   RKelly SplinterMD [facility] pager [phone]  11/23/2016, 9:49 AM    Recent Labs Lab 11/18/16 0302  11/20/16 0231 11/21/16 0247 11/22/16 0725  NA 132*  < > 133* 134* 134*  K 5.1  < > 5.6* 4.3 4.6  CL 93*  < > 94* 92* 93*  CO2 28  < > '26 29 28  '$ GLUCOSE 114*  < > 118* 128* 116*  BUN 19  < > 43* 22* 42*  CREATININE 5.00*  < > 8.59* 5.30* 7.38*  CALCIUM 8.0*  < > 7.5* 8.1* 8.1*  PHOS 5.7*  --   --   --  4.7*  < > = values in this interval not displayed.  Recent Labs Lab 11/18/16  0302 11/22/16 0725  ALBUMIN 2.3* 2.0*    Recent Labs Lab 11/16/16 1554  11/20/16 0231 11/21/16 0247 11/22/16 0724  WBC 14.0*  < > 14.1* 16.7* 15.9*  NEUTROABS 10.9*  --   --   --   --   HGB 13.1  < > 8.6* 8.7* 7.8*  HCT 38.9  < > 27.2* 27.3* 25.2*  MCV 86.3  < > 88.6 88.9 89.0  PLT 349  < > 254 268 295  < > = values in this interval not displayed. Iron/TIBC/Ferritin/ %Sat    Component Value Date/Time   IRON 119 05/26/2015 1040   TIBC 220 (L) 05/26/2015 1040   FERRITIN 950 (H) 05/26/2015 1040   IRONPCTSAT 54 (H) 05/26/2015 1040

## 2016-11-23 NOTE — Progress Notes (Signed)
[facility]  PROGRESS NOTE  [name] [name] Murph ZHY:[id number] DOB: [birth year] DOA: 11/16/2016 PCP: Cathlean Cower, MD   Brief HPI:   76 year old female with ESRD on HD TTS, hypertension, osteoarthritis, 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 Patient had missed her dialysis session on the day of admission and 2 days ago due to passing of her friend. X-ray of her left leg showed acute comminuted fracture off mid to distal left tibia. BMET showed BUN 95, potassium 6.9. EKG showed peaked T waves. Orthopedics and nephrology were consulted.    Subjective   Patient seen and examined, denies shortness of breath. Still requiring 4 l/min oxygen via nasal canula. CTA negative for pulmonary embolism. It shows new patchy consolidation in current dose attenuation in the dependent peripheral left upper lobe and mild patchy ground was opacity in the right middle lobe suggestive of infectious or inflammatory pneumonia   Assessment/Plan:     1. Left tibia fracture- secondary to mechanical fall, x-ray showed comminuted fracture mid to distal left tibia. Patient was seen by orthopedic surgery. She underwent intramedullary nail placement left tibia. Postop day #3. Patient has a bed available at skilled nursing facility. Will discharge once medically stable. We'll await orthopedics recommendation regarding DVT prophylaxis and postop care with follow-up. 2. Anemia-hemoglobin is 7.8, was 8.7 yesterday. Likely postop blood loss anemia. Will recheck hemoglobin in am and transfuse for Hb less than 8.0. 3. Hyperkalemia- resolved, patient's potassium was 6.9 on admission with peaked T waves noted on EKG, secondary to ESRD with missed hemodialysis. Nephrology was consulted and patient underwent urgent hemodialysis. Today potassium is 4.6 4. Acute hypoxic respiratory failure- patient is consistently  requiring 4-5 L of oxygen via nasal cannula. O2 sats dropping to 80's on room air. Previous chest x-ray on 11/16/2016 showed persistent right upper lobe airspace disease. Repeat chest x-ray showed no acute abnormality, and  ABG was normal. Patient has history of COPD, squamous cell lung cancer. Will obtain CTA chest to rule out pulmonary embolism.CTA negative for pulmonary embolism. It shows new patchy consolidation in current dose attenuation in the dependent peripheral left upper lobe and mild patchy ground was opacity in the right middle lobe suggestive of infectious or inflammatory pneumonia. We'll consult pulmonary for further recommendations 5. ESRD- patient gets dialysis Tuesday Thursday and Saturday, she missed 2 sessions prior to admission. Nephrology following. Patient underwent hemodialysis yesterday on 11/20/2016, next hemodialysis on 11/22/16. 6. COPD- patient denies shortness of breath at this time, continue Dulera , when necessary albuterol. 7. Lung cancer, squamous cell- patient is followed by oncologist at [facility]. Patient completed chemoradiation and is now undergoing imaging surveillance every 3 months. Per oncology note from February there is no evidence of recurrence and her chronic dry cough is likely due to treatment and her new baseline. 8. GERD- stable, continue daily PPIs 9. Hypothyroidism-stable, continue Synthroid    DVT prophylaxis: Heparin  Code Status: Full code  Family Communication: No family present at bedside   Disposition Plan: Skilled nursing facility   Consultants:  Orthopedics  Procedures:  Intramedullary nail placement in left tibia  Continuous infusions     Antibiotics:   Anti-infectives    Start     Dose/Rate Route Frequency Ordered Stop   11/18/16 1002  clindamycin (CLEOCIN) 900 MG/50ML IVPB    Comments:  [name]   : cabinet override  11/18/16 1002 11/18/16 1015   11/17/16 0800  clindamycin (CLEOCIN) IVPB  900 mg     900 mg 100 mL/hr over 30 Minutes Intravenous To Short Stay 11/17/16 0204 11/18/16 1030       Objective   Vitals:   11/22/16 2357 11/23/16 0339 11/23/16 0800 11/23/16 0957  BP: (!) 92/50 (!) 74/42 (!) 110/47   Pulse: 94 100 97   Resp: '17 19 15   '$ Temp: 98.7 F (37.1 C) 97.7 F (36.5 C) 98.9 F (37.2 C)   TempSrc: Oral Axillary Oral   SpO2: 100% 100% 100% 99%  Weight:      Height:        Intake/Output Summary (Last 24 hours) at 11/23/16 1225 Last data filed at 11/22/16 1800  Gross per 24 hour  Intake              350 ml  Output                0 ml  Net              350 ml   Filed Weights   11/20/16 1205 11/22/16 0708 11/22/16 1116  Weight: 70.5 kg (155 lb 6.8 oz) 72.7 kg (160 lb 4.4 oz) 71.5 kg (157 lb 10.1 oz)     Physical Examination:   Physical Exam: Eyes: No icterus, extraocular muscles intact  Mouth: Oral mucosa is moist, no lesions on palate,  Neck: Supple, no deformities, masses, or tenderness Lungs: Normal respiratory effort, bilateral clear to auscultation, no crackles or wheezes.  Heart: Regular rate and rhythm, S1 and S2 normal, no murmurs, rubs auscultated Abdomen: BS normoactive,soft,nondistended,non-tender to palpation,no organomegaly Extremities: No pretibial edema, no erythema, no cyanosis, no clubbing Neuro : Alert and oriented to time, place and person, No focal deficits Skin: No rashes seen on exam     Data Reviewed: I have personally reviewed following labs and imaging studies  CBG:  Recent Labs Lab 11/17/16 0147 11/17/16 0650 11/17/16 1131 11/18/16 0817 11/18/16 1243  GLUCAP 107* 100* 86 89 77    CBC:  Recent Labs Lab 11/16/16 1554  11/18/16 0731 11/19/16 0238 11/20/16 0231 11/21/16 0247 11/22/16 0724  WBC 14.0*  --  13.1* 11.3* 14.1* 16.7* 15.9*  NEUTROABS 10.9*  --   --   --   --   --   --   HGB 13.1  < > 10.5* 11.2* 8.6* 8.7* 7.8*  HCT 38.9  < > 33.4* 34.6* 27.2* 27.3* 25.2*  MCV 86.3  --  89.1 88.9 88.6  88.9 89.0  PLT 349  --  229 195 254 268 295  < > = values in this interval not displayed.  Basic Metabolic Panel:  Recent Labs Lab 11/18/16 0302 11/19/16 0238 11/20/16 0231 11/21/16 0247 11/22/16 0725  NA 132* 133* 133* 134* 134*  K 5.1 5.8* 5.6* 4.3 4.6  CL 93* 95* 94* 92* 93*  CO2 '28 25 26 29 28  '$ GLUCOSE 114* 92 118* 128* 116*  BUN 19 29* 43* 22* 42*  CREATININE 5.00* 6.78* 8.59* 5.30* 7.38*  CALCIUM 8.0* 7.7* 7.5* 8.1* 8.1*  PHOS 5.7*  --   --   --  4.7*    Recent Results (from the past 240 hour(s))  Surgical pcr screen     Status: Abnormal   Collection Time: 11/17/16  2:39 AM  Result Value Ref Range Status   MRSA, PCR NEGATIVE NEGATIVE Final   Staphylococcus aureus POSITIVE (A) NEGATIVE Final  Comment:        The Xpert SA Assay (FDA approved for NASAL specimens in patients over 52 years of age), is one component of a comprehensive surveillance program.  Test performance has been validated by [facility] for patients greater than or equal to 73 year old. It is not intended to diagnose infection nor to guide or monitor treatment.      Liver Function Tests:  Recent Labs Lab 11/18/16 0302 11/22/16 0725  ALBUMIN 2.3* 2.0*      Studies: Ct Angio Chest Pe W Or Wo Contrast  Result Date: 11/22/2016 CLINICAL DATA:  Inpatient. Acute hypoxic respiratory failure. 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 EXAM: CT ANGIOGRAPHY CHEST WITH CONTRAST TECHNIQUE: Multidetector CT imaging of the chest was performed using the standard protocol during bolus administration of intravenous contrast. Multiplanar CT image reconstructions and MIPs were obtained to evaluate the vascular anatomy. CONTRAST:  100 cc Isovue 370 IV. COMPARISON:  Chest radiograph from one day prior. 11/18/2015 PET-CT. 10/22/2015 chest CT. FINDINGS: Cardiovascular: The study is high quality for the evaluation of pulmonary  embolism. There are no filling defects in the central, lobar, segmental or subsegmental pulmonary artery branches to suggest acute pulmonary embolism. Atherosclerotic nonaneurysmal thoracic aorta. Normal caliber pulmonary arteries. Normal heart size. No significant pericardial fluid/thickening. Left main, left anterior descending, left circumflex and right coronary atherosclerosis. Mediastinum/Nodes: Status post total thyroidectomy. Mildly patulous and otherwise unremarkable thoracic esophagus. No axillary adenopathy. No pathologically enlarged mediastinal or hilar nodes. Lungs/Pleura: No pneumothorax. No pleural effusion. Mild centrilobular emphysema with mild diffuse bronchial wall thickening. Masslike fibrosis in the right upper lobe measuring up to 6.0 x 3.9 cm (series 7/ image 25) with associated volume loss and distortion, compatible with post radiation change. Subsegmental bilateral lower lobe atelectasis. There is new patchy consolidation and ground-glass attenuation in the peripheral dependent left upper lobe. Mild patchy ground-glass opacity in the right middle lobe is new. Upper abdomen: Cholecystectomy.  Symmetric renal atrophy. Musculoskeletal: No aggressive appearing focal osseous lesions. Mild thoracic spondylosis. Review of the MIP images confirms the above findings. IMPRESSION: 1. No pulmonary embolism. 2. New patchy consolidation and ground-glass attenuation in the dependent peripheral left upper lobe and mild patchy ground-glass opacity in the right middle lobe, suggestive of an infectious or inflammatory pneumonia. 3. Masslike fibrosis in the right upper lobe is compatible with radiation change. No definite findings of local tumor recurrence or metastatic disease in the chest. Continued chest CT surveillance is recommended . 4. Aortic atherosclerosis. Left main and 3 vessel coronary atherosclerosis. Electronically Signed   By: Ilona Sorrel M.D.   On: 11/22/2016 16:26    Scheduled Meds: .  allopurinol  100 mg Oral Daily  . atorvastatin  40 mg Oral Daily  . cholecalciferol  5,000 Units Oral Daily  . cinacalcet  30 mg Oral Q supper  . darbepoetin (ARANESP) injection - DIALYSIS  100 mcg Intravenous Q Thu-HD  . dextromethorphan-guaiFENesin  1 tablet Oral Daily  . doxercalciferol  2 mcg Intravenous Q T,Th,Sa-HD  . DULoxetine  60 mg Oral Daily  . feeding supplement (NEPRO CARB STEADY)  237 mL Oral BID BM  . heparin  5,000 Units Subcutaneous Q8H  . hydrOXYzine  20 mg Oral Daily  . levothyroxine  175 mcg Oral QAC breakfast  . mouth rinse  15 mL Mouth Rinse BID  . midodrine  10 mg Oral TID WC  . mometasone-formoterol  2  puff Inhalation BID  . montelukast  10 mg Oral QHS  . multivitamin  1 tablet Oral QHS  . pantoprazole  40 mg Oral Daily  . pregabalin  50 mg Oral QHS  . QUEtiapine  100 mg Oral QHS  . sevelamer carbonate  3,200 mg Oral TID WC      Time spent: 25 min  [facility] Pager [phone]. If 7PM-7AM, please contact night-coverage at [email or web address], Office  [phone]  password [email or web address] 11/23/2016, 12:25 PM  LOS: 7 days

## 2016-11-24 ENCOUNTER — Inpatient Hospital Stay (HOSPITAL_COMMUNITY): Payer: Medicare Other

## 2016-11-24 LAB — BASIC METABOLIC PANEL
Anion gap: 16 — ABNORMAL HIGH (ref 5–15)
BUN: 20 mg/dL (ref 6–20)
CHLORIDE: 94 mmol/L — AB (ref 101–111)
CO2: 25 mmol/L (ref 22–32)
CREATININE: 3.66 mg/dL — AB (ref 0.44–1.00)
Calcium: 8.6 mg/dL — ABNORMAL LOW (ref 8.9–10.3)
GFR calc Af Amer: 13 mL/min — ABNORMAL LOW (ref >60)
GFR, EST NON AFRICAN AMERICAN: 11 mL/min — AB (ref >60)
Glucose, Bld: 105 mg/dL — ABNORMAL HIGH (ref 65–99)
Potassium: 3.7 mmol/L (ref 3.5–5.1)
Sodium: 135 mmol/L (ref 135–145)

## 2016-11-24 LAB — CBC
HCT: 28.8 % — ABNORMAL LOW (ref 36.0–46.0)
Hemoglobin: 8.8 g/dL — ABNORMAL LOW (ref 12.0–15.0)
MCH: 28.2 pg (ref 26.0–34.0)
MCHC: 30.6 g/dL (ref 30.0–36.0)
MCV: 92.3 fL (ref 78.0–100.0)
PLATELETS: 292 10*3/uL (ref 150–400)
RBC: 3.12 MIL/uL — ABNORMAL LOW (ref 3.87–5.11)
RDW: 19.2 % — ABNORMAL HIGH (ref 11.5–15.5)
WBC: 16.9 10*3/uL — ABNORMAL HIGH (ref 4.0–10.5)

## 2016-11-24 LAB — PROCALCITONIN: PROCALCITONIN: 4.42 ng/mL

## 2016-11-24 MED ORDER — OXYCODONE HCL 5 MG PO TABS: 5.0000 mg | ORAL_TABLET | Freq: Three times a day (TID) | ORAL | 0 refills | Status: DC | PRN

## 2016-11-24 MED ORDER — BISACODYL 10 MG RE SUPP
10.0000 mg | Freq: Once | RECTAL | Status: AC
  Administered 2016-11-24: 10 mg via RECTAL
  Filled 2016-11-24: qty 1

## 2016-11-24 MED ORDER — LEVOFLOXACIN 500 MG PO TABS: 500.0000 mg | ORAL_TABLET | ORAL | 0 refills | Status: AC

## 2016-11-24 MED ORDER — LEVOFLOXACIN 500 MG PO TABS: 500.0000 mg | ORAL_TABLET | ORAL | Status: DC

## 2016-11-24 MED ORDER — POLYETHYLENE GLYCOL 3350 17 G PO PACK: 17.0000 g | PACK | Freq: Every day | ORAL | 0 refills | Status: AC | PRN

## 2016-11-24 MED ORDER — LEVOFLOXACIN 750 MG PO TABS
750.0000 mg | ORAL_TABLET | Freq: Once | ORAL | Status: AC
  Administered 2016-11-24: 750 mg via ORAL
  Filled 2016-11-24: qty 1

## 2016-11-24 MED ORDER — CLONAZEPAM 1 MG PO TABS: 1.0000 mg | ORAL_TABLET | Freq: Two times a day (BID) | ORAL | 0 refills | Status: DC | PRN

## 2016-11-24 MED ORDER — NEPRO/CARBSTEADY PO LIQD: 237.0000 mL | Freq: Two times a day (BID) | ORAL | 0 refills | Status: DC

## 2016-11-24 MED ORDER — AMOXICILLIN-POT CLAVULANATE 500-125 MG PO TABS: 1.0000 | ORAL_TABLET | Freq: Every day | ORAL | Status: DC

## 2016-11-24 NOTE — Progress Notes (Addendum)
Pharmacy Antibiotic Note  [name] [name] is a 76 y.o. female admitted on 11/16/2016 with pneumonia.  Pharmacy has been consulted for levaquin dosing. WBC count is elevated at 16.9 but patient afebrile. Chest x-ray on 4/21 showed possible pneumonia. Patient has ESRD with HD TTS. Patient had last HD 4/20 as an extra session to help with fluid balance. 3.5 hours at BFR 400.   Plan: Levaquin 750 mg X 1 now Then Levaquin 500 mg every 48 hours in the evening.  Monitor s/sx of infection and HD sessions Pharmacy will monitor peripherally. Please re-consult if needed.    Height: '5\' 7"'$  (170.2 cm) Weight: 149 lb 14.6 oz (68 kg) IBW/kg (Calculated) : 61.6  Temp (24hrs), Avg:98.8 F (37.1 C), Min:98 F (36.7 C), Max:99.7 F (37.6 C)   Recent Labs Lab 11/18/16 0731  11/20/16 0231 11/21/16 0247 11/22/16 0724 11/22/16 0725 11/23/16 1403 11/24/16 0712  WBC 13.1*  < > 14.1* 16.7* 15.9*  --  17.6* 16.9*  CREATININE  --   < > 8.59* 5.30*  --  7.38* 5.27* 3.66*  LATICACIDVEN 1.1  --   --   --   --   --   --   --   < > = values in this interval not displayed.  Estimated Creatinine Clearance: 12.7 mL/min (A) (by C-G formula based on SCr of 3.66 mg/dL (H)).    Allergies  Allergen Reactions  . Cephalosporins Itching and Rash    PATIENT DENIES THIS REACTION - Vanc and fortaz given at the same time in June for several doses at dialysis with systemic rash and itching; received zinacef 7/5 and had worseningsystemicrash/ itching and swelling of eyes - so unclear if allergic to either or both  . Nsaids Other (See Comments)    Renal dysfunction  . Pioglitazone Swelling    PATIENT DENIES THIS REACTON - edema  . Adhesive [Tape] Rash  . Vancomycin Rash    PATIENT DENIES THIS REACTON - See comment under cephalosporin    Antimicrobials this admission: 4/21 Levaquin>>    Microbiology results: 4/14 MRSA PCR negative   Thank you for allowing pharmacy to be a part of this patient's care.  Ihor Austin, PharmD PGY1 Pharmacy Resident Pager: [phone] 11/24/2016 8:47 AM

## 2016-11-24 NOTE — Progress Notes (Signed)
Went over d/c paperwork with pt - bundle ready for Ambulance staff to give to nursing home

## 2016-11-24 NOTE — Progress Notes (Signed)
Pt refused labs. RN explained the importance of the labs and asked if she would allow lab to draw. The patient stated no. RN will try again later.

## 2016-11-24 NOTE — Clinical Social Work Placement (Signed)
   CLINICAL SOCIAL WORK PLACEMENT  NOTE  Date:  11/24/2016  Patient Details  Name: [name] [name] MRN: [id number] Date of Birth: 03/24/1941  Clinical Social Work is seeking post-discharge placement for this patient at the [place] level of care (*CSW will initial, date and re-position this form in  chart as items are completed):  Yes   Patient/family provided with [place] Work Department's list of facilities offering this level of care within the geographic area requested by the patient (or if unable, by the patient's family).  Yes   Patient/family informed of their freedom to choose among providers that offer the needed level of care, that participate in Medicare, Medicaid or managed care program needed by the patient, have an available bed and are willing to accept the patient.  Yes   Patient/family informed of [facility]'s ownership interest in [facility] and [facility], as well as of the fact that they are under no obligation to receive care at these facilities.  PASRR submitted to EDS on 11/19/16     PASRR number received on 11/19/16     Existing PASRR number confirmed on       FL2 transmitted to all facilities in geographic area requested by pt/family on 11/19/16     FL2 transmitted to all facilities within larger geographic area on       Patient informed that his/her managed care company has contracts with or will negotiate with certain facilities, including the following:        Yes   Patient/family informed of bed offers received.  Patient chooses bed at  [facility])     Physician recommends and patient chooses bed at      Patient to be transferred to  [facility]) on 11/24/16.  Patient to be transferred to facility by  [facility])     Patient family notified on 11/24/16 of transfer.  Name of family member notified:  [place] - Friend     PHYSICIAN       Additional Comment:     _______________________________________________ Serafina Mitchell, [place] 11/24/2016, 1:07 PM

## 2016-11-24 NOTE — Progress Notes (Signed)
Pt discharged per ambulance with all paperwork - pt has her clothing, dentures, cellphone and charger with her She let her family know she is being moved to [facility] for rehab Pt had hard formed small BM prior to leaving this facility

## 2016-11-24 NOTE — Progress Notes (Signed)
[facility] Progress Note   Subjective: no c/o, on RA with 95% O2sat.   Vitals:   11/24/16 0100 11/24/16 0426 11/24/16 0600 11/24/16 0800  BP: (!) 95/56 (!) 101/55 (!) 100/57 (!) 83/41  Pulse: 100 93 90 89  Resp: (!) '21 20 17 17  '$ Temp:  99 F (37.2 C)  98.9 F (37.2 C)  TempSrc:  Oral  Oral  SpO2: 96% 100% 100% 100%  Weight:      Height:        Inpatient medications: . allopurinol  100 mg Oral Daily  . atorvastatin  40 mg Oral Daily  . cholecalciferol  5,000 Units Oral Daily  . cinacalcet  30 mg Oral Q supper  . darbepoetin (ARANESP) injection - DIALYSIS  100 mcg Intravenous Q Thu-HD  . dextromethorphan-guaiFENesin  1 tablet Oral Daily  . doxercalciferol  2 mcg Intravenous Q T,Th,Sa-HD  . DULoxetine  60 mg Oral Daily  . feeding supplement (NEPRO CARB STEADY)  237 mL Oral BID BM  . heparin  5,000 Units Subcutaneous Q8H  . hydrOXYzine  20 mg Oral Daily  . [START ON 11/26/2016] levofloxacin  500 mg Oral Q48H  . levofloxacin  750 mg Oral Once  . levothyroxine  175 mcg Oral QAC breakfast  . mouth rinse  15 mL Mouth Rinse BID  . midodrine  10 mg Oral TID WC  . mometasone-formoterol  2 puff Inhalation BID  . montelukast  10 mg Oral QHS  . multivitamin  1 tablet Oral QHS  . pantoprazole  40 mg Oral Daily  . pregabalin  50 mg Oral QHS  . QUEtiapine  100 mg Oral QHS  . sevelamer carbonate  3,200 mg Oral TID WC    albuterol, clonazePAM, cyclobenzaprine, HYDROcodone-acetaminophen, lip balm, morphine injection, ondansetron (ZOFRAN) IV, oxyCODONE, polyethylene glycol, sevelamer carbonate  Exam: Frail edlerly AAF, alert No jvd Chest clear bilat RRR no RG Abd soft ntnd LLE wrapped ace bandage, bilat old toe amps RUA AVF+bruit NF, ox 2  Dialysis: TTS [facility] 3h 28mn  69.5kg   R AVF   Hep 1200 - Hect 6 ug - last Mircera 2/19 - Venofer 50/wk      Assessment: 1. Tib/ fib fracture LLE - sp ORIF 4/15 2. Hypoxemia - improving w vol removal, po abx 3. Volume-  under dry wt 1kg, 4. Hypotension - started midodrine here 10 tid 5. ESRD TTS HD 6. Anemia / CKD - Hb 13  > 10> 8-9 here, resuming ESA w darbe here at 100ug /wk; check Fe 7. MBD/ CKD - cont hect, sensipar 8. Hx R lung [place] cancer - s/p chemoradiation, f/b [facility] 9. Dispo - for dc to SNF, ok for dc from renal standpoint  Plan - short HD today on schedule, UF 1-2 L as tolerated.    RKelly SplinterMD CNewell Rubbermaidpager [phone]  11/24/2016, 10:50 AM    Recent Labs Lab 11/18/16 0302  11/22/16 0725 11/23/16 1403 11/24/16 0712  NA 132*  < > 134* 133* 135  K 5.1  < > 4.6 3.9 3.7  CL 93*  < > 93* 93* 94*  CO2 28  < > '28 29 25  '$ GLUCOSE 114*  < > 116* 136* 105*  BUN 19  < > 42* 28* 20  CREATININE 5.00*  < > 7.38* 5.27* 3.66*  CALCIUM 8.0*  < > 8.1* 7.9* 8.6*  PHOS 5.7*  --  4.7* 3.5  --   < > = values  in this interval not displayed.  Recent Labs Lab 11/18/16 0302 11/22/16 0725 11/23/16 1403  ALBUMIN 2.3* 2.0* 2.0*    Recent Labs Lab 11/22/16 0724 11/23/16 1403 11/24/16 0712  WBC 15.9* 17.6* 16.9*  HGB 7.8* 7.5* 8.8*  HCT 25.2* 24.0* 28.8*  MCV 89.0 90.6 92.3  PLT 295 332 292   Iron/TIBC/Ferritin/ %Sat    Component Value Date/Time   IRON 119 05/26/2015 1040   TIBC 220 (L) 05/26/2015 1040   FERRITIN 950 (H) 05/26/2015 1040   IRONPCTSAT 54 (H) 05/26/2015 1040

## 2016-11-24 NOTE — Clinical Social Work Note (Signed)
Clinical Social Worker facilitated patient discharge including contacting patient family and facility to confirm patient discharge plans.  Clinical information faxed to facility and family agreeable with plan.  SW arranged ambulance transport via [facility] to [facility] [employer] RN to call report prior to discharge.  Clinical Social Worker will sign off for now as social work intervention is no longer needed. Please consult [place] again if new need arises.  Deaisa Merida B. Joline Maxcy Clinical Social Work Dept Weekend Social Worker [phone] 1:01 PM

## 2016-11-24 NOTE — Discharge Summary (Signed)
Physician Discharge Summary  [name] [name] YOV:[id number] DOB: 1941/08/01 DOA: 11/16/2016  PCP: Cathlean Cower, MD  Admit date: 11/16/2016 Discharge date: 11/24/2016  Time spent: 25* minutes  Recommendations for Outpatient Follow-up:  1. Follow-up Dr. Ninfa Linden in 2 weeks 2. Recommendations per orthopedics- Will remain non-weight bearing on that left leg for 4-6 weeks.   Discharge Diagnoses:  Principal Problem:   Tibia/fibula fracture, left, closed, initial encounter Active Problems:   Hypothyroidism   Asthmatic bronchitis , chronic (HCC)   GERD   Renovascular hypertension   Hyperkalemia   ESRD on hemodialysis (HCC)   Squamous cell lung cancer [facility])   Discharge Condition: Stable  Diet recommendation: Heart healthy diet  Filed Weights   11/22/16 1116 11/23/16 1300 11/23/16 1650  Weight: 71.5 kg (157 lb 10.1 oz) 69.5 kg (153 lb 3.5 oz) 68 kg (149 lb 14.6 oz)    History of present illness:  76 year old female with ESRD on HD TTS, hypertension, osteoarthritis, 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 Patient had missed her dialysis session on the day of admission and 2 days ago due to passing of her friend. X-ray of her left leg showed acute comminuted fracture off mid to distal left tibia. BMET showed BUN 95, potassium 6.9. EKG showed peaked T waves. Orthopedics and nephrology were consulted.   Hospital Course:  1. Left tibia fracture- secondary to mechanical fall, x-ray showed comminuted fracture mid to distal left tibia. Patient was seen by orthopedic surgery. She underwent intramedullary nail placement left tibia.  Patient has a bed available at skilled nursing facility. I called and discussed with orthopedic surgeon Dr. Roma Kayser on call for Dr. Ninfa Linden, who recommends no DVT prophylaxis. Patient cannot tolerate aspirin. 2. Anemia-hemoglobin is 8.8 was 7.5  yesterday. Likely postop blood loss anemia. Continue Aranesp 3. Hyperkalemia- resolved, patient's potassium was 6.9 on admission with peaked T waves noted on EKG, secondary to ESRD with missed hemodialysis. Nephrology was consulted and patient underwent urgent hemodialysis. Today potassium is 3.7. 4. Acute hypoxic respiratory failure-resolved, likely combination of pulmonary edema and underlying infectious process, patient was consistently requiring 4-5 L of oxygen via nasal cannula. O2 sats dropping to 80's on room air. Previous chest x-ray on 11/16/2016 showed persistent right upper lobe airspace disease. Repeat chest x-ray showed no acute abnormality, and  ABG was normal. Patient has history of COPD, squamous cell lung cancer. CTA negative for pulmonary embolism. It showed new patchy consolidation in current dose attenuation in the dependent peripheral left upper lobe and mild patchy ground was opacity in the right middle lobe suggestive of infectious or inflammatory pneumonia.  pulmonary was consulted and recommended 7 days of Augmentin. Patient has allergy to Keflex when so will give Levaquin 500 mg by mouth every 48 hours for total 7 days. Stop on 11/30/2016. Patient is currently maintaining O2 sats greater than 90% on room air. 5. ESRD- patient gets dialysis Tuesday Thursday and Saturday, she missed 2 sessions prior to admission. Nephrology following.  6. COPD- patient denies shortness of breath at this time, continue Dulera, when necessary albuterol. 7. Lung cancer, squamous cell- patient is followed by oncologist at the [facility]. Patient completed chemoradiation and is now undergoing imaging surveillance every 3 months. Per oncology note from February there is no evidence of recurrence and her chronic dry cough is likely due to treatment and her new baseline. 8. GERD- stable,  continue daily PPIs 9. Hypothyroidism-stable, continue Synthroid  Procedures:  None    Consultations:  Nephrology  Pulmonology  Discharge Exam: Vitals:   11/24/16 1130 11/24/16 1200      11/24/16 1340 Checked                                                        In    HD    BP: (!) 74/41 (!) 75/40                  107/54  Pulse: 88 97                                96  Resp: (!) 22 (!) 26  Temp:      General: Appears in no acute distress Cardiovascular: RRR, S1S2 Respiratory: Clear bilaterally  Discharge Instructions   Discharge Instructions    Diet - low sodium heart healthy    Complete by:  As directed    Increase activity slowly    Complete by:  As directed      Current Discharge Medication List    START taking these medications   Details  levofloxacin (LEVAQUIN) 500 MG tablet Take 1 tablet (500 mg total) by mouth every other day. Qty: 2 tablet, Refills: 0    Nutritional Supplements (FEEDING SUPPLEMENT, NEPRO CARB STEADY,) LIQD Take 237 mLs by mouth 2 (two) times daily between meals. Refills: 0    polyethylene glycol (MIRALAX / GLYCOLAX) packet Take 17 g by mouth daily as needed for mild constipation. Qty: 14 each, Refills: 0      CONTINUE these medications which have CHANGED   Details  clonazePAM (KLONOPIN) 1 MG tablet Take 1 tablet (1 mg total) by mouth 2 (two) times daily as needed for anxiety. Qty: 10 tablet, Refills: 0    oxyCODONE (OXY IR/ROXICODONE) 5 MG immediate release tablet Take 1 tablet (5 mg total) by mouth every 8 (eight) hours as needed. Qty: 10 tablet, Refills: 0      CONTINUE these medications which have NOT CHANGED   Details  albuterol (PROVENTIL HFA;VENTOLIN HFA) 108 (90 BASE) MCG/ACT inhaler Inhale 2 puffs into the lungs every 6 (six) hours as needed for wheezing or shortness of breath. Qty: 1 Inhaler, Refills: 5    allopurinol (ZYLOPRIM) 100 MG tablet TAKE ONE TABLET BY MOUTH ONCE DAILY Qty: 90 tablet, Refills: 0    atorvastatin (LIPITOR) 40 MG tablet TAKE ONE TABLET BY MOUTH ONCE DAILY Qty: 30 tablet, Refills:  5    budesonide-formoterol (SYMBICORT) 160-4.5 MCG/ACT inhaler Inhale 2 puffs into the lungs 2 (two) times daily. Wait 1 min in between puffs, rinse mouth after use    cetirizine (ZYRTEC) 10 MG tablet Take 10 mg by mouth daily.    Cholecalciferol (VITAMIN D-3) 5000 units TABS Take 1 tablet by mouth daily.    cinacalcet (SENSIPAR) 30 MG tablet Take 30 mg by mouth daily with supper.    colchicine 0.6 MG tablet Take 0.5 tablets (0.3 mg total) by mouth 2 (two) times a week. Qty: 5 tablet, Refills: 0    cyclobenzaprine (FLEXERIL) 10 MG tablet TAKE ONE TABLET BY MOUTH THREE TIMES DAILY AS NEEDED FOR MUSCLE SPASM Qty: 30 tablet, Refills: 3    darbepoetin (  ARANESP) 25 MCG/0.42ML SOLN injection Inject 25 mcg into the vein every 7 (seven) days. Every Thursday at dialysis    dextromethorphan-guaiFENesin [facility] DM) 30-600 MG 12hr tablet Take 1 tablet by mouth 2 (two) times daily as needed for cough.     doxercalciferol (HECTOROL) 4 MCG/2ML injection Inject 1 mL (2 mcg total) into the vein Every Tuesday,Thursday,and Saturday with dialysis.    DULoxetine (CYMBALTA) 30 MG capsule Take 2 capsules (60 mg total) by mouth daily. Qty: 180 capsule, Refills: 1    hydrOXYzine (ATARAX/VISTARIL) 10 MG tablet Take 20 mg by mouth daily.     Lactulose 20 GM/30ML SOLN Take 30 mLs by mouth daily as needed.     levothyroxine (SYNTHROID, LEVOTHROID) 175 MCG tablet Take 1 tablet (175 mcg total) by mouth daily before breakfast. Qty: 90 tablet, Refills: 3    mometasone-formoterol (DULERA) 100-5 MCG/ACT AERO Inhale 2 puffs into the lungs 2 (two) times daily.    montelukast (SINGULAIR) 10 MG tablet TAKE ONE TABLET BY MOUTH AT BEDTIME. SCHEDULE APPOINTMENT FOR YEARLY PHYSICAL WITH LABS. MUST SEE DOCTOR FOR REFILLS Qty: 30 tablet, Refills: 0    multivitamin (RENA-VIT) TABS tablet Take 1 tablet by mouth daily.    omeprazole (PRILOSEC) 40 MG capsule Take 1 capsule (40 mg total) by mouth daily. Qty: 90 capsule,  Refills: 1    ondansetron (ZOFRAN) 8 MG tablet Take 1 tablet (8 mg total) by mouth every 6 (six) hours as needed for nausea or vomiting. Qty: 30 tablet, Refills: 1    !! OVER THE COUNTER MEDICATION Take 1 capsule by mouth 2 (two) times daily. Beet Root 1000 mg per cap    !! OVER THE COUNTER MEDICATION 8 drops by Other route daily. Places 8 drops in 1 bottle of water  "Cell Food"    pregabalin (LYRICA) 50 MG capsule Take 50 mg by mouth at bedtime.     QUEtiapine (SEROQUEL) 100 MG tablet Take 1 tablet (100 mg total) by mouth at bedtime. Qty: 90 tablet, Refills: 0    sevelamer carbonate (RENVELA) 800 MG tablet Take 2,400-3,200 mg by mouth See admin instructions. 4 tabs with meals, 3 tabs with snacks    Turmeric Curcumin 500 MG CAPS Take 500 mg by mouth daily.     !! - Potential duplicate medications found. Please discuss with provider.     Allergies  Allergen Reactions  . Cephalosporins Itching and Rash    PATIENT DENIES THIS REACTION - Vanc and fortaz given at the same time in June for several doses at dialysis with systemic rash and itching; received zinacef 7/5 and had worseningsystemicrash/ itching and swelling of eyes - so unclear if allergic to either or both  . Nsaids Other (See Comments)    Renal dysfunction  . Pioglitazone Swelling    PATIENT DENIES THIS REACTON - edema  . Adhesive [Tape] Rash  . Vancomycin Rash    PATIENT DENIES THIS REACTON - See comment under cephalosporin   Follow-up Information    Mcarthur Rossetti, MD. Schedule an appointment as soon as possible for a visit in 2 week(s).   Specialty:  Orthopedic Surgery Contact information: [address] [place] [address] [phone]            The results of significant diagnostics from this hospitalization (including imaging, microbiology, ancillary and laboratory) are listed below for reference.    Significant Diagnostic Studies: Dg Chest 2 View  Result Date: 11/16/2016 CLINICAL  DATA:  Initial evaluation for acute trauma,  fall. EXAM: CHEST  2 VIEW COMPARISON:  Prior radiograph from 05/29/2016. FINDINGS: Cardiac and mediastinal silhouettes are stable in size and contour, and remain within normal limits. Aortic atherosclerosis. Lungs mildly hypoinflated. Persistent irregular and linear opacity at the right lung apex, similar to previous. Finding suspected to largely reflect post treatment changes. Residual malignancy not excluded. Similarly, superimposed infection not excluded either. No other focal airspace disease. Mild scarring at the left lung base. No pulmonary edema or pleural effusion. No pneumothorax. No acute osseus abnormality.  Scoliosis. IMPRESSION: 1. Persistent right upper lobe airspace disease. Findings suspected to largely reflect post treatment changes. Residual malignancy or superimposed infection not excluded. 2. Left basilar scarring. 3. Aortic atherosclerosis. Electronically Signed   By: Jeannine Boga M.D.   On: 11/16/2016 16:49   Dg Tibia/fibula Left  Result Date: 11/18/2016 CLINICAL DATA:  Left tibial ORIF. EXAM: DG C-ARM 61-120 MIN; LEFT TIBIA AND FIBULA - 2 VIEW COMPARISON:  Left tibia radiographs 11/16/2016. FLUOROSCOPY TIME:  Fluoroscopy Time:  2 minutes 47 seconds Number of Acquired Spot Images: 0 FINDINGS: Patient is status post placement of an intramedullary rod. There 2 distal and 2 proximal interlocking screws. There is near anatomic reduction of the complex spiral fracture in the distal tibia. The proximal fibular fracture is stable. IMPRESSION: 1. Interval ORIF of the left tibial fracture with an intramedullary rod and interlocking screws without radiographic evidence for complication. Electronically Signed   By: San Morelle M.D.   On: 11/18/2016 15:50   Dg Tibia/fibula Left  Result Date: 11/16/2016 CLINICAL DATA:  Initial evaluation for acute trauma, fall. EXAM: LEFT TIBIA AND FIBULA - 2 VIEW COMPARISON:  None. FINDINGS: Acute  comminuted fracture of the mid-distal shaft of the left tibia. Slight lateral no definite fibular fracture. Overlying soft tissue swelling at the mid leg. Displacement of the main butterfly fragment. IMPRESSION: Acute comminuted fracture of the mid-distal left tibia with overlying soft tissue swelling. Electronically Signed   By: Jeannine Boga M.D.   On: 11/16/2016 16:46   Ct Angio Chest Pe W Or Wo Contrast  Result Date: 11/22/2016 CLINICAL DATA:  Inpatient. Acute hypoxic respiratory failure. 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 EXAM: CT ANGIOGRAPHY CHEST WITH CONTRAST TECHNIQUE: Multidetector CT imaging of the chest was performed using the standard protocol during bolus administration of intravenous contrast. Multiplanar CT image reconstructions and MIPs were obtained to evaluate the vascular anatomy. CONTRAST:  100 cc Isovue 370 IV. COMPARISON:  Chest radiograph from one day prior. 11/18/2015 PET-CT. 10/22/2015 chest CT. FINDINGS: Cardiovascular: The study is high quality for the evaluation of pulmonary embolism. There are no filling defects in the central, lobar, segmental or subsegmental pulmonary artery branches to suggest acute pulmonary embolism. Atherosclerotic nonaneurysmal thoracic aorta. Normal caliber pulmonary arteries. Normal heart size. No significant pericardial fluid/thickening. Left main, left anterior descending, left circumflex and right coronary atherosclerosis. Mediastinum/Nodes: Status post total thyroidectomy. Mildly patulous and otherwise unremarkable thoracic esophagus. No axillary adenopathy. No pathologically enlarged mediastinal or hilar nodes. Lungs/Pleura: No pneumothorax. No pleural effusion. Mild centrilobular emphysema with mild diffuse bronchial wall thickening. Masslike fibrosis in the right upper lobe measuring up to 6.0 x 3.9 cm (series 7/ image 25) with associated volume loss and  distortion, compatible with post radiation change. Subsegmental bilateral lower lobe atelectasis. There is new patchy consolidation and ground-glass attenuation in the peripheral dependent left upper lobe. Mild patchy ground-glass opacity in the right middle lobe is  new. Upper abdomen: Cholecystectomy.  Symmetric renal atrophy. Musculoskeletal: No aggressive appearing focal osseous lesions. Mild thoracic spondylosis. Review of the MIP images confirms the above findings. IMPRESSION: 1. No pulmonary embolism. 2. New patchy consolidation and ground-glass attenuation in the dependent peripheral left upper lobe and mild patchy ground-glass opacity in the right middle lobe, suggestive of an infectious or inflammatory pneumonia. 3. Masslike fibrosis in the right upper lobe is compatible with radiation change. No definite findings of local tumor recurrence or metastatic disease in the chest. Continued chest CT surveillance is recommended . 4. Aortic atherosclerosis. Left main and 3 vessel coronary atherosclerosis. Electronically Signed   By: Ilona Sorrel M.D.   On: 11/22/2016 16:26   Dg Chest Port 1 View  Result Date: 11/24/2016 CLINICAL DATA:  Respiratory failure. EXAM: PORTABLE CHEST 1 VIEW COMPARISON:  11/22/2016 chest CT and 11/21/2016 chest radiograph FINDINGS: Surgical clips are present in the lower neck. The cardiac silhouette is unchanged and within normal limits for size. Aortic atherosclerosis is noted. The patient has taken a shallower inspiration than on the prior radiograph. Consolidation in the right lung apex is unchanged and compatible with previously described radiation fibrosis. Patchy and hazy opacities in both lung bases have increased from the prior chest radiograph and may have also mildly increased from the interval chest CT as well. No sizable pleural effusion or pneumothorax is identified. Degenerative changes are noted at the left glenohumeral joint, and there is thoracic dextroscoliosis.  IMPRESSION: Increasing, patchy bibasilar opacities which may reflect pneumonia or atelectasis. Electronically Signed   By: Logan Bores M.D.   On: 11/24/2016 08:08   Dg Chest Port 1 View  Result Date: 11/21/2016 CLINICAL DATA:  Hypoxemia EXAM: PORTABLE CHEST 1 VIEW COMPARISON:  11/16/2016, 05/29/2016 FINDINGS: Cardiac shadow is stable. Aortic calcifications are again seen. Chronic scarring in the right apex is again seen stable from the prior exam consistent with the known history of radiation therapy. No focal infiltrate or sizable effusion is noted. No acute bony abnormality is seen. IMPRESSION: Chronic changes in the right apex.  No acute abnormality noted. Electronically Signed   By: Inez Catalina M.D.   On: 11/21/2016 12:21   Dg C-arm 1-60 Min  Result Date: 11/18/2016 CLINICAL DATA:  Left tibial ORIF. EXAM: DG C-ARM 61-120 MIN; LEFT TIBIA AND FIBULA - 2 VIEW COMPARISON:  Left tibia radiographs 11/16/2016. FLUOROSCOPY TIME:  Fluoroscopy Time:  2 minutes 47 seconds Number of Acquired Spot Images: 0 FINDINGS: Patient is status post placement of an intramedullary rod. There 2 distal and 2 proximal interlocking screws. There is near anatomic reduction of the complex spiral fracture in the distal tibia. The proximal fibular fracture is stable. IMPRESSION: 1. Interval ORIF of the left tibial fracture with an intramedullary rod and interlocking screws without radiographic evidence for complication. Electronically Signed   By: San Morelle M.D.   On: 11/18/2016 15:50    Microbiology: Recent Results (from the past 240 hour(s))  Surgical pcr screen     Status: Abnormal   Collection Time: 11/17/16  2:39 AM  Result Value Ref Range Status   MRSA, PCR NEGATIVE NEGATIVE Final   Staphylococcus aureus POSITIVE (A) NEGATIVE Final    Comment:        The Xpert SA Assay (FDA approved for NASAL specimens in patients over 79 years of age), is one component of a comprehensive surveillance program.  Test  performance has been validated by [facility] for patients greater than or equal to 1 year  old. It is not intended to diagnose infection nor to guide or monitor treatment.      Labs: Basic Metabolic Panel:  Recent Labs Lab 11/18/16 0302  11/20/16 0231 11/21/16 0247 11/22/16 0725 11/23/16 1403 11/24/16 0712  NA 132*  < > 133* 134* 134* 133* 135  K 5.1  < > 5.6* 4.3 4.6 3.9 3.7  CL 93*  < > 94* 92* 93* 93* 94*  CO2 28  < > '26 29 28 29 25  '$ GLUCOSE 114*  < > 118* 128* 116* 136* 105*  BUN 19  < > 43* 22* 42* 28* 20  CREATININE 5.00*  < > 8.59* 5.30* 7.38* 5.27* 3.66*  CALCIUM 8.0*  < > 7.5* 8.1* 8.1* 7.9* 8.6*  PHOS 5.7*  --   --   --  4.7* 3.5  --   < > = values in this interval not displayed. Liver Function Tests:  Recent Labs Lab 11/18/16 0302 11/22/16 0725 11/23/16 1403  ALBUMIN 2.3* 2.0* 2.0*   No results for input(s): LIPASE, AMYLASE in the last 168 hours. No results for input(s): AMMONIA in the last 168 hours. CBC:  Recent Labs Lab 11/20/16 0231 11/21/16 0247 11/22/16 0724 11/23/16 1403 11/24/16 0712  WBC 14.1* 16.7* 15.9* 17.6* 16.9*  HGB 8.6* 8.7* 7.8* 7.5* 8.8*  HCT 27.2* 27.3* 25.2* 24.0* 28.8*  MCV 88.6 88.9 89.0 90.6 92.3  PLT 254 268 295 332 292    CBG:  Recent Labs Lab 11/18/16 0817 11/18/16 1243  GLUCAP 89 77       Signed:  Aicha Clingenpeel S MD.  [facility] 11/24/2016, 1:23 PM

## 2016-11-24 NOTE — Progress Notes (Signed)
Pt came back from Dialysis has not had a recorded BM since 4/15  Has bowel sounds - call to Dr Darrick Meigs- pt given dulcolax supp and Miralax.

## 2016-11-24 NOTE — Progress Notes (Signed)
Pt c/o nausea given Zofran IV - after 1 hour took some of her am meds says she will take the rest when she gets back from dialysis has not had a BM since 4/15 Pt eats less than 10 % of each meal. Ate a comtainer of applesauce and 1 glass of apple juice this am.

## 2016-11-24 NOTE — Progress Notes (Addendum)
Called report to [facility] gave report to [employer]

## 2016-11-28 ENCOUNTER — Non-Acute Institutional Stay (SKILLED_NURSING_FACILITY): Payer: Medicare Other | Admitting: Internal Medicine

## 2016-11-28 ENCOUNTER — Encounter: Payer: Self-pay | Admitting: Internal Medicine

## 2016-11-28 DIAGNOSIS — F339 Major depressive disorder, recurrent, unspecified: Secondary | ICD-10-CM

## 2016-11-28 DIAGNOSIS — Z992 Dependence on renal dialysis: Secondary | ICD-10-CM

## 2016-11-28 DIAGNOSIS — E039 Hypothyroidism, unspecified: Secondary | ICD-10-CM | POA: Diagnosis not present

## 2016-11-28 DIAGNOSIS — M792 Neuralgia and neuritis, unspecified: Secondary | ICD-10-CM

## 2016-11-28 DIAGNOSIS — D72829 Elevated white blood cell count, unspecified: Secondary | ICD-10-CM | POA: Diagnosis not present

## 2016-11-28 DIAGNOSIS — J189 Pneumonia, unspecified organism: Secondary | ICD-10-CM

## 2016-11-28 DIAGNOSIS — S82252S Displaced comminuted fracture of shaft of left tibia, sequela: Secondary | ICD-10-CM | POA: Diagnosis not present

## 2016-11-28 DIAGNOSIS — R2681 Unsteadiness on feet: Secondary | ICD-10-CM | POA: Diagnosis not present

## 2016-11-28 DIAGNOSIS — J449 Chronic obstructive pulmonary disease, unspecified: Secondary | ICD-10-CM

## 2016-11-28 DIAGNOSIS — K5900 Constipation, unspecified: Secondary | ICD-10-CM

## 2016-11-28 DIAGNOSIS — E785 Hyperlipidemia, unspecified: Secondary | ICD-10-CM | POA: Diagnosis not present

## 2016-11-28 DIAGNOSIS — D500 Iron deficiency anemia secondary to blood loss (chronic): Secondary | ICD-10-CM

## 2016-11-28 DIAGNOSIS — N186 End stage renal disease: Secondary | ICD-10-CM

## 2016-11-28 DIAGNOSIS — K219 Gastro-esophageal reflux disease without esophagitis: Secondary | ICD-10-CM

## 2016-11-28 NOTE — Progress Notes (Signed)
LOCATION: [facility]  PCP: Cathlean Cower, MD   Code Status: Full Code  Goals of care: Advanced Directive information Advanced Directives 11/18/2016  Does Patient Have a Medical Advance Directive? Yes  Type of Advance Directive [place]  Does patient want to make changes to medical advance directive? No - Patient declined  Copy of [place] in Chart? No - copy requested  Would patient like information on creating a medical advance directive? -  Pre-existing out of facility DNR order (yellow form or pink MOST form) -       Extended Emergency Contact Information Primary Emergency Contact: Dunston,Celesta Address: [place], [place] [address] [place] Phone: [phone] Mobile Phone: [phone] Relation: Friend Secondary Emergency Contact: Charyl Bigger [place] Phone: [phone] Mobile Phone: [phone] Relation: Nephew   Allergies  Allergen Reactions  . Cephalosporins Itching and Rash    PATIENT DENIES THIS REACTION - Vanc and fortaz given at the same time in June for several doses at dialysis with systemic rash and itching; received zinacef 7/5 and had worseningsystemicrash/ itching and swelling of eyes - so unclear if allergic to either or both  . Nsaids Other (See Comments)    Renal dysfunction  . Pioglitazone Swelling    PATIENT DENIES THIS REACTON - edema  . Adhesive [Tape] Rash  . Vancomycin Rash    PATIENT DENIES THIS REACTON - See comment under cephalosporin    Chief Complaint  Patient presents with  . New Admit To SNF    New Admission Visit      HPI:  Patient is a 76 y.o. female seen today for short term rehabilitation post hospital admission from 11/16/16-11/24/16 post fall with left tibia-fibula fracture. She underwent IM nailing. Post operatively, she had acute hypoxic respiratory failure. Pulmonary embolism was ruled out and pulmonology  was consulted. She was placed on oxygen by nasal canula. CTA chest showed fibrotic chnages in right apex corresponding to where she had a lung mass and patchy infiltrates to left upper and lower lobes. She was started on antibiotic for infectious etiology. 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 She is seen in her room today. She refused dialysis yesterday because she did not feel the need for it.   Review of Systems:  Constitutional: Negative for fever, chills, diaphoresis. She feels weak and tired.  HENT: Negative for headache, congestion, nasal discharge, sore throat. Positive for occasional difficulty swallowing.   Eyes: Negative for eye pain, blurred vision, double vision and discharge.  Respiratory: Negative for shortness of breath and wheezing. Positive for cough with yellow phlegm.   Cardiovascular: Negative for chest pain,leg swelling. Positive for occasional palpitations.  Gastrointestinal: Negative for heartburn,vomiting, abdominal pain,melena, diarrhea and constipation. Positive for poor appetite and occasional nausea. Last bowel movement was yesterday.  Genitourinary: Negative for dysuria.  Musculoskeletal: Negative for back pain, fall in the facility. Positive for pain to left leg, pain medications are helping.  Skin: Negative for itching, rash.  Neurological: Negative for dizziness. Psychiatric/Behavioral: Negative for depression   Past Medical History:  Diagnosis Date  . ANEMIA-NOS 05/29/2007  . ANXIETY 03/23/2010  . Arthritis    "all over my body"  . Cancer of kidney ([place]) 10/07/2012   Followed per Dr Despina Pole, MD, urology, [place]   . Chronic bronchitis ([place])   .  Chronic lower back pain   . Chronic neck pain 12/13/2010  . 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 COMMON MIGRAINE    "stress common migraines"  . Complication of anesthesia    after goiter removed-one vocal cord paralyzed  .  Critical lower limb ischemia   . DEPRESSION 02/17/2007  . ESRD (end stage renal disease) on dialysis ([place])    "TTS. [address]" (10/20/2015)  . ESRD on hemodialysis ([place]) 02/17/2007   Started dialysis April 2014.  Gets HD at [facility] on TTS schedule.  Cause of ESRD was HTN.     . GERD (gastroesophageal reflux disease)   . GOUT 05/29/2007  . Heart murmur 10/02/2010   hx  . HYPERLIPIDEMIA 02/17/2007  . HYPERTENSION 02/17/2007  . HYPOTHYROIDISM 02/17/2007   s/p surgical removal of goiter in 1997  . Memory loss 01/24/2010  . OSTEOPENIA 09/22/2009  . Palpitations 09/08/2010  . PEPTIC ULCER DISEASE 05/29/2007   "when I was in college"  . PERIPHERAL NEUROPATHY 05/29/2007  . PERIPHERAL VASCULAR DISEASE 02/17/2007  . Personal history of colonic polyps 11/16/2009  . Renal insufficiency   . RESTLESS LEG SYNDROME 05/29/2007  . SEIZURE DISORDER 02/17/2007  . Type II diabetes mellitus ([place]) 02/17/2007   "haven't had it since ~ 2010" (10/20/2015)  . Vocal cord paralysis 1996   Past Surgical History:  Procedure Laterality Date  . A/V SHUNTOGRAM N/A 10/22/2016   Procedure: A/V Fistulagram - Right Arm;  Surgeon: Angelia Mould, MD;  Location: [facility];  Service: Cardiovascular;  Laterality: N/A;  . AV FISTULA PLACEMENT  03/13/2012   Procedure: ARTERIOVENOUS (AV) FISTULA CREATION;  Surgeon: Conrad [place], MD;  Location: [facility];  Service: Vascular;  Laterality: Right;  . BUNIONECTOMY Bilateral 1980  . CATARACT EXTRACTION, BILATERAL Bilateral    bilateral cataract removal  . ELECTROCARDIOGRAM  05/29/2006  . ESOPHAGOSCOPY W/ BOTOX INJECTION  07/22/2012   Procedure: ESOPHAGOSCOPY WITH BOTOX INJECTION;  Surgeon: Rozetta Nunnery, MD;  Location: [facility];  Service: ENT;  Laterality: N/A;  esophageal dilation  . INSERTION OF DIALYSIS CATHETER N/A 02/05/2013   Procedure: INSERTION OF DIALYSIS CATHETER;  Surgeon: Angelia Mould, MD;  Location: [facility];  Service: Vascular;   Laterality: N/A;  Ultrasound guided  . LAPAROSCOPIC CHOLECYSTECTOMY    . PERIPHERAL VASCULAR BALLOON ANGIOPLASTY Right 10/22/2016   Procedure: Peripheral Vascular Balloon Angioplasty;  Surgeon: Angelia Mould, MD;  Location: [facility];  Service: Cardiovascular;  Laterality: Right;  AV fistula  . SHOULDER OPEN ROTATOR CUFF REPAIR Left    Dr. Sharol Given  . stress Cardiolite  06/18/2006  . THYROID SURGERY  1997   goiter removal  . TIBIA IM NAIL INSERTION Left 11/18/2016   Procedure: INTRAMEDULLARY (IM) NAIL TIBIAL;  Surgeon: Mcarthur Rossetti, MD;  Location: [facility];  Service: Orthopedics;  Laterality: Left;  . TOE AMPUTATION Left 2006  . TOE AMPUTATION Right Aug. 2015   2nd   . tranthoracic echocardiogram  06/18/2006  . VIDEO BRONCHOSCOPY Bilateral 10/25/2015   Procedure: VIDEO BRONCHOSCOPY WITH FLUORO;  Surgeon: Rigoberto Noel, MD;  Location: [facility];  Service: Cardiopulmonary;  Laterality: Bilateral;   Social History:   reports that she has quit smoking. Her smoking use included Cigarettes. She has a 12.75 pack-year smoking history. She has never used smokeless tobacco. She reports that she does not drink alcohol or use drugs.  Family History  Problem Relation Age of Onset  . Dementia  Mother   . Hypertension Mother   . Coronary artery disease Other   . Hyperlipidemia Other   . Hypertension Other   . Ovarian cancer Other   . Stroke Other   . Hypertension Sister   . Hypertension Brother   . Heart attack Brother   . Stroke Brother     Medications: Allergies as of 11/28/2016      Reactions   Cephalosporins Itching, Rash   PATIENT DENIES THIS REACTION - Vanc and fortaz given at the same time in June for several doses at dialysis with systemic rash and itching; received zinacef 7/5 and had worseningsystemicrash/ itching and swelling of eyes - so unclear if allergic to either or both   Nsaids Other (See Comments)   Renal dysfunction   Pioglitazone Swelling   PATIENT  DENIES THIS REACTON - edema   Adhesive [tape] Rash   Vancomycin Rash   PATIENT DENIES THIS REACTON - See comment under cephalosporin      Medication List       Accurate as of 11/28/16 11:15 AM. Always use your most recent med list.          albuterol 108 (90 Base) MCG/ACT inhaler Commonly known as:  PROVENTIL HFA;VENTOLIN HFA Inhale 2 puffs into the lungs every 6 (six) hours as needed for wheezing or shortness of breath.   allopurinol 100 MG tablet Commonly known as:  ZYLOPRIM TAKE ONE TABLET BY MOUTH ONCE DAILY   atorvastatin 40 MG tablet Commonly known as:  LIPITOR TAKE ONE TABLET BY MOUTH ONCE DAILY   budesonide-formoterol 160-4.5 MCG/ACT inhaler Commonly known as:  SYMBICORT Inhale 2 puffs into the lungs 2 (two) times daily. Wait 1 min in between puffs, rinse mouth after use   cetirizine 10 MG tablet Commonly known as:  ZYRTEC Take 10 mg by mouth daily.   cinacalcet 30 MG tablet Commonly known as:  SENSIPAR Take 30 mg by mouth daily with supper.   clonazePAM 1 MG tablet Commonly known as:  KLONOPIN Take 1 tablet (1 mg total) by mouth 2 (two) times daily as needed for anxiety.   colchicine 0.6 MG tablet Take 0.5 tablets (0.3 mg total) by mouth 2 (two) times a week.   cyclobenzaprine 10 MG tablet Commonly known as:  FLEXERIL TAKE ONE TABLET BY MOUTH THREE TIMES DAILY AS NEEDED FOR MUSCLE SPASM   darbepoetin 25 MCG/0.42ML Soln injection Commonly known as:  ARANESP Inject 25 mcg into the vein every 7 (seven) days. Every Thursday at dialysis   dextromethorphan-guaiFENesin 30-600 MG 12hr tablet Commonly known as:  MUCINEX DM Take 1 tablet by mouth 2 (two) times daily as needed for cough.   doxercalciferol 4 MCG/2ML injection Commonly known as:  HECTOROL Inject 1 mL (2 mcg total) into the vein Every Tuesday,Thursday,and Saturday with dialysis.   DULoxetine 30 MG capsule Commonly known as:  CYMBALTA Take 2 capsules (60 mg total) by mouth daily.     hydrOXYzine 10 MG tablet Commonly known as:  ATARAX/VISTARIL Take 20 mg by mouth at bedtime.   Lactulose 20 GM/30ML Soln Take 30 mLs by mouth daily as needed.   levofloxacin 500 MG tablet Commonly known as:  LEVAQUIN Take 1 tablet (500 mg total) by mouth every other day.   levothyroxine 175 MCG tablet Commonly known as:  SYNTHROID, LEVOTHROID Take 1 tablet (175 mcg total) by mouth daily before breakfast.   mometasone-formoterol 100-5 MCG/ACT Aero Commonly known as:  DULERA Inhale 2 puffs into the lungs 2 (two) times  daily.   montelukast 10 MG tablet Commonly known as:  SINGULAIR TAKE ONE TABLET BY MOUTH AT BEDTIME. SCHEDULE APPOINTMENT FOR YEARLY PHYSICAL WITH LABS. MUST SEE DOCTOR FOR REFILLS   multivitamin with minerals tablet Take 1 tablet by mouth daily.   omeprazole 40 MG capsule Commonly known as:  PRILOSEC Take 1 capsule (40 mg total) by mouth daily.   ondansetron 8 MG tablet Commonly known as:  ZOFRAN Take 1 tablet (8 mg total) by mouth every 6 (six) hours as needed for nausea or vomiting.   OVER THE COUNTER MEDICATION Take 1 capsule by mouth daily. Beet Root 1000 mg per cap   OVER THE COUNTER MEDICATION 8 drops by Other route daily. Places 8 drops in 1 bottle of water  "Cell Food"   oxyCODONE 5 MG immediate release tablet Commonly known as:  Oxy IR/ROXICODONE Take 1 tablet (5 mg total) by mouth every 8 (eight) hours as needed.   polyethylene glycol packet Commonly known as:  MIRALAX / GLYCOLAX Take 17 g by mouth daily as needed for mild constipation.   pregabalin 50 MG capsule Commonly known as:  LYRICA Take 50 mg by mouth at bedtime.   QUEtiapine 100 MG tablet Commonly known as:  SEROQUEL Take 1 tablet (100 mg total) by mouth at bedtime.   sevelamer carbonate 800 MG tablet Commonly known as:  RENVELA Take 2,400-3,200 mg by mouth See admin instructions. 4 tabs with meals, 3 tabs with snacks   Turmeric Curcumin 500 MG Caps Take 500 mg by mouth  daily.   UNABLE TO FIND Med Name: Med pass 240 mL by mouth 2 times daily   Vitamin D-3 5000 units Tabs Take 1 tablet by mouth daily.       Immunizations: Immunization History  Administered Date(s) Administered  . Influenza Split 04/24/2012  . Influenza Whole 04/25/2007, 05/06/2010  . Influenza, High Dose Seasonal PF 06/15/2015, 05/07/2016  . Influenza-Unspecified 05/18/2014  . PPD Test 10/28/2015, 11/24/2016  . Pneumococcal Conjugate-13 10/14/2013  . Pneumococcal Polysaccharide-23 04/25/2007  . Td 09/15/2008     Physical Exam: Vitals:   11/28/16 1103  BP: 126/90  Pulse: 78  Resp: 20  Temp: 98.2 F (36.8 C)  TempSrc: Oral  SpO2: 99%  Weight: 150 lb 8 oz (68.3 kg)  Height: '5\' 7"'$  (1.702 m)   Body mass index is 23.57 kg/m.  General- elderly female, frail and thin built, in no acute distress Head- normocephalic, atraumatic Nose-  no nasal discharge Throat- moist mucus membrane, normal oropharynx, has dentures Eyes- PERRLA, EOMI, no pallor, no icterus, no discharge, normal conjunctiva, normal sclera Neck- no cervical lymphadenopathy Cardiovascular- normal s1,s2, no murmur Respiratory- bilateral clear to auscultation, no wheeze, no rhonchi, no crackles, no use of accessory muscles Abdomen- bowel sounds present, soft, non tender, no guarding or rigidity Musculoskeletal- able to move all 4 extremities, limited left leg ROM, no leg edema, limited left shoulder ROM Neurological- alert and oriented to person, place and month but not to year Skin- warm and dry, erythema with hematoma to LLE, AV fistula to RUE with good thrill, surgical incisions to left leg with dressing in place Psychiatry- normal mood and affect    Labs reviewed: Basic Metabolic Panel:  Recent Labs  11/18/16 0302  11/22/16 0725 11/23/16 1403 11/24/16 0712  NA 132*  < > 134* 133* 135  K 5.1  < > 4.6 3.9 3.7  CL 93*  < > 93* 93* 94*  CO2 28  < > 28 29 25  GLUCOSE 114*  < > 116* 136* 105*  BUN  19  < > 42* 28* 20  CREATININE 5.00*  < > 7.38* 5.27* 3.66*  CALCIUM 8.0*  < > 8.1* 7.9* 8.6*  PHOS 5.7*  --  4.7* 3.5  --   < > = values in this interval not displayed. Liver Function Tests:  Recent Labs  11/18/16 0302 11/22/16 0725 11/23/16 1403  ALBUMIN 2.3* 2.0* 2.0*   No results for input(s): LIPASE, AMYLASE in the last 8760 hours. No results for input(s): AMMONIA in the last 8760 hours. CBC:  Recent Labs  11/16/16 1554  11/22/16 0724 11/23/16 1403 11/24/16 0712  WBC 14.0*  < > 15.9* 17.6* 16.9*  NEUTROABS 10.9*  --   --   --   --   HGB 13.1  < > 7.8* 7.5* 8.8*  HCT 38.9  < > 25.2* 24.0* 28.8*  MCV 86.3  < > 89.0 90.6 92.3  PLT 349  < > 295 332 292  < > = values in this interval not displayed. Cardiac Enzymes: No results for input(s): CKTOTAL, CKMB, CKMBINDEX, TROPONINI in the last 8760 hours. BNP: Invalid input(s): POCBNP CBG:  Recent Labs  11/17/16 1131 11/18/16 0817 11/18/16 1243  GLUCAP 86 89 77    Radiological Exams: Dg Chest 2 View  Result Date: 11/16/2016 CLINICAL DATA:  Initial evaluation for acute trauma, fall. EXAM: CHEST  2 VIEW COMPARISON:  Prior radiograph from 05/29/2016. FINDINGS: Cardiac and mediastinal silhouettes are stable in size and contour, and remain within normal limits. Aortic atherosclerosis. Lungs mildly hypoinflated. Persistent irregular and linear opacity at the right lung apex, similar to previous. Finding suspected to largely reflect post treatment changes. Residual malignancy not excluded. Similarly, superimposed infection not excluded either. No other focal airspace disease. Mild scarring at the left lung base. No pulmonary edema or pleural effusion. No pneumothorax. No acute osseus abnormality.  Scoliosis. IMPRESSION: 1. Persistent right upper lobe airspace disease. Findings suspected to largely reflect post treatment changes. Residual malignancy or superimposed infection not excluded. 2. Left basilar scarring. 3. Aortic  atherosclerosis. Electronically Signed   By: Jeannine Boga M.D.   On: 11/16/2016 16:49   Dg Tibia/fibula Left  Result Date: 11/18/2016 CLINICAL DATA:  Left tibial ORIF. EXAM: DG C-ARM 61-120 MIN; LEFT TIBIA AND FIBULA - 2 VIEW COMPARISON:  Left tibia radiographs 11/16/2016. FLUOROSCOPY TIME:  Fluoroscopy Time:  2 minutes 47 seconds Number of Acquired Spot Images: 0 FINDINGS: Patient is status post placement of an intramedullary rod. There 2 distal and 2 proximal interlocking screws. There is near anatomic reduction of the complex spiral fracture in the distal tibia. The proximal fibular fracture is stable. IMPRESSION: 1. Interval ORIF of the left tibial fracture with an intramedullary rod and interlocking screws without radiographic evidence for complication. Electronically Signed   By: San Morelle M.D.   On: 11/18/2016 15:50   Dg Tibia/fibula Left  Result Date: 11/16/2016 CLINICAL DATA:  Initial evaluation for acute trauma, fall. EXAM: LEFT TIBIA AND FIBULA - 2 VIEW COMPARISON:  None. FINDINGS: Acute comminuted fracture of the mid-distal shaft of the left tibia. Slight lateral no definite fibular fracture. Overlying soft tissue swelling at the mid leg. Displacement of the main butterfly fragment. IMPRESSION: Acute comminuted fracture of the mid-distal left tibia with overlying soft tissue swelling. Electronically Signed   By: Jeannine Boga M.D.   On: 11/16/2016 16:46   Ct Angio Chest Pe W Or Wo Contrast  Result Date: 11/22/2016 CLINICAL DATA:  Inpatient. Acute hypoxic respiratory failure. 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 EXAM: CT ANGIOGRAPHY CHEST WITH CONTRAST TECHNIQUE: Multidetector CT imaging of the chest was performed using the standard protocol during bolus administration of intravenous contrast. Multiplanar CT image reconstructions and MIPs were obtained to evaluate the vascular  anatomy. CONTRAST:  100 cc Isovue 370 IV. COMPARISON:  Chest radiograph from one day prior. 11/18/2015 PET-CT. 10/22/2015 chest CT. FINDINGS: Cardiovascular: The study is high quality for the evaluation of pulmonary embolism. There are no filling defects in the central, lobar, segmental or subsegmental pulmonary artery branches to suggest acute pulmonary embolism. Atherosclerotic nonaneurysmal thoracic aorta. Normal caliber pulmonary arteries. Normal heart size. No significant pericardial fluid/thickening. Left main, left anterior descending, left circumflex and right coronary atherosclerosis. Mediastinum/Nodes: Status post total thyroidectomy. Mildly patulous and otherwise unremarkable thoracic esophagus. No axillary adenopathy. No pathologically enlarged mediastinal or hilar nodes. Lungs/Pleura: No pneumothorax. No pleural effusion. Mild centrilobular emphysema with mild diffuse bronchial wall thickening. Masslike fibrosis in the right upper lobe measuring up to 6.0 x 3.9 cm (series 7/ image 25) with associated volume loss and distortion, compatible with post radiation change. Subsegmental bilateral lower lobe atelectasis. There is new patchy consolidation and ground-glass attenuation in the peripheral dependent left upper lobe. Mild patchy ground-glass opacity in the right middle lobe is new. Upper abdomen: Cholecystectomy.  Symmetric renal atrophy. Musculoskeletal: No aggressive appearing focal osseous lesions. Mild thoracic spondylosis. Review of the MIP images confirms the above findings. IMPRESSION: 1. No pulmonary embolism. 2. New patchy consolidation and ground-glass attenuation in the dependent peripheral left upper lobe and mild patchy ground-glass opacity in the right middle lobe, suggestive of an infectious or inflammatory pneumonia. 3. Masslike fibrosis in the right upper lobe is compatible with radiation change. No definite findings of local tumor recurrence or metastatic disease in the chest.  Continued chest CT surveillance is recommended . 4. Aortic atherosclerosis. Left main and 3 vessel coronary atherosclerosis. Electronically Signed   By: Ilona Sorrel M.D.   On: 11/22/2016 16:26   Dg Chest Port 1 View  Result Date: 11/24/2016 CLINICAL DATA:  Respiratory failure. EXAM: PORTABLE CHEST 1 VIEW COMPARISON:  11/22/2016 chest CT and 11/21/2016 chest radiograph FINDINGS: Surgical clips are present in the lower neck. The cardiac silhouette is unchanged and within normal limits for size. Aortic atherosclerosis is noted. The patient has taken a shallower inspiration than on the prior radiograph. Consolidation in the right lung apex is unchanged and compatible with previously described radiation fibrosis. Patchy and hazy opacities in both lung bases have increased from the prior chest radiograph and may have also mildly increased from the interval chest CT as well. No sizable pleural effusion or pneumothorax is identified. Degenerative changes are noted at the left glenohumeral joint, and there is thoracic dextroscoliosis. IMPRESSION: Increasing, patchy bibasilar opacities which may reflect pneumonia or atelectasis. Electronically Signed   By: Logan Bores M.D.   On: 11/24/2016 08:08   Dg Chest Port 1 View  Result Date: 11/21/2016 CLINICAL DATA:  Hypoxemia EXAM: PORTABLE CHEST 1 VIEW COMPARISON:  11/16/2016, 05/29/2016 FINDINGS: Cardiac shadow is stable. Aortic calcifications are again seen. Chronic scarring in the right apex is again seen stable from the prior exam consistent with the known history of radiation therapy. No focal infiltrate or sizable effusion is noted. No acute bony abnormality is seen. IMPRESSION: Chronic changes in the right apex.  No acute abnormality noted. Electronically Signed  By: Inez Catalina M.D.   On: 11/21/2016 12:21   Dg C-arm 1-60 Min  Result Date: 11/18/2016 CLINICAL DATA:  Left tibial ORIF. EXAM: DG C-ARM 61-120 MIN; LEFT TIBIA AND FIBULA - 2 VIEW COMPARISON:  Left  tibia radiographs 11/16/2016. FLUOROSCOPY TIME:  Fluoroscopy Time:  2 minutes 47 seconds Number of Acquired Spot Images: 0 FINDINGS: Patient is status post placement of an intramedullary rod. There 2 distal and 2 proximal interlocking screws. There is near anatomic reduction of the complex spiral fracture in the distal tibia. The proximal fibular fracture is stable. IMPRESSION: 1. Interval ORIF of the left tibial fracture with an intramedullary rod and interlocking screws without radiographic evidence for complication. Electronically Signed   By: San Morelle M.D.   On: 11/18/2016 15:50    Assessment/Plan  Unsteady gait Will have her work with physical therapy and occupational therapy team to help with gait training and muscle strengthening exercises.fall precautions. Skin care. Encourage to be out of bed.   Left tibial fracture s/p IM nail. Will need orthopedic follow up. Continue oxyIR 5 mg q8h prn pain and cyclobenzaperine 10 mg tid prn muscle spasm. Will have patient work with PT/OT as tolerated to regain strength and restore function.  Fall precautions are in place. NWB to LLE for 4-6 weeks per ortho recs. PMR consult.   Left lobe pneumonia Continue and complete course of levaquin 500 mg qod until today. Monitor her breathing. Currently appears stable.  Leukocytosis Currently on antibiotic for pneumonia. Continue and complete antibiotic. Monitor wbc curve  Blood loss anemia Post op, monitor cbc. Also has Anemia of chronic disease With ESRD, continue aranesp weekly at dialysis, monitor cbc  Hyperlipidemia c/w atorvastatin  ESRD Continue HD 3 days a week, encouraged and counselled pt not to miss dialysis. Continue sensipar   Neuropathic pain Continue lyrica and monitor  Constipation On prn miralax and lactulose, monitor  Hypothyroidism Continue home regimen levothyroxine  gerd Stable symptom on omeprazole, monitor  COPD Breathing is stable, continue symbicort, dulera  and singulair with prn proventil  Major depressive disorder Get psych consult, continue seroquel, duloxetine and klonopin current regimen     Goals of care: short term rehabilitation   Labs/tests ordered: cbc, bmp 11/29/16  Family/ staff Communication: reviewed care plan with patient and nursing supervisor    Blanchie Serve, MD Internal Medicine [address] [place], [place] [address] Cell Phone (Monday-Friday 8 am - 5 pm): [phone] On Call: [phone] and follow prompts after 5 pm and on weekends Office Phone: [phone] Office Fax: [phone]

## 2016-11-29 LAB — HEMOGLOBIN A1C: Hemoglobin A1C: 6.4

## 2016-11-29 LAB — BASIC METABOLIC PANEL
BUN: 90 mg/dL — AB (ref 4–21)
Creatinine: 10.4 mg/dL — AB (ref 0.5–1.1)
POTASSIUM: 5.2 mmol/L (ref 3.4–5.3)
POTASSIUM: 5.2 mmol/L (ref 3.4–5.3)
SODIUM: 130 mmol/L — AB (ref 137–147)
SODIUM: 130 mmol/L — AB (ref 137–147)

## 2016-11-29 LAB — CBC AND DIFFERENTIAL
HEMOGLOBIN: 9 g/dL — AB (ref 12.0–16.0)
Platelets: 510 10*3/uL — AB (ref 150–399)
WBC: 20 10^3/mL

## 2016-11-29 LAB — HEMATOCRIT: HCT: 29.5

## 2016-11-29 LAB — WBC: WBC: 20

## 2016-11-29 LAB — IRON: Iron: 36

## 2016-11-29 LAB — FERRITIN: Ferritin: 5932

## 2016-11-29 LAB — HEPATIC FUNCTION PANEL: Alkaline Phosphatase: 149 U/L — AB (ref 25–125)

## 2016-12-03 LAB — BASIC METABOLIC PANEL
BUN: 58 mg/dL — AB (ref 4–21)
CREATININE: 8.4 mg/dL — AB (ref 0.5–1.1)
Glucose: 92 mg/dL
Potassium: 5.1 mmol/L (ref 3.4–5.3)
Sodium: 136 mmol/L — AB (ref 137–147)

## 2016-12-03 LAB — CBC AND DIFFERENTIAL
HEMATOCRIT: 22 % — AB (ref 36–46)
HEMOGLOBIN: 7 g/dL — AB (ref 12.0–16.0)
PLATELETS: 376 10*3/uL (ref 150–399)
WBC: 15.4 10^3/mL

## 2016-12-04 ENCOUNTER — Encounter: Payer: Self-pay | Admitting: Family

## 2016-12-04 ENCOUNTER — Telehealth (INDEPENDENT_AMBULATORY_CARE_PROVIDER_SITE_OTHER): Payer: Self-pay | Admitting: Orthopedic Surgery

## 2016-12-04 ENCOUNTER — Non-Acute Institutional Stay (SKILLED_NURSING_FACILITY): Payer: Medicare Other | Admitting: Family

## 2016-12-04 DIAGNOSIS — N189 Chronic kidney disease, unspecified: Secondary | ICD-10-CM

## 2016-12-04 DIAGNOSIS — D631 Anemia in chronic kidney disease: Secondary | ICD-10-CM | POA: Diagnosis not present

## 2016-12-04 DIAGNOSIS — D72829 Elevated white blood cell count, unspecified: Secondary | ICD-10-CM

## 2016-12-04 DIAGNOSIS — L89620 Pressure ulcer of left heel, unstageable: Secondary | ICD-10-CM

## 2016-12-04 NOTE — Progress Notes (Signed)
Location:    Nursing Home Room Number: 741 P Place of Service:  SNF (31) Provider: Blanchie Serve, MD   Cathlean Cower, MD  Patient Care Team: Biagio Borg, MD as PCP - General Donato Heinz, MD as Consulting Physician (Nephrology) Dixie Dials, MD as Consulting Physician (Cardiology) Trula Slade, DPM as Consulting Physician (Podiatry) [facility]  Extended Emergency Contact Information Primary Emergency Contact: Dunston,Celesta Address: [address]          [place], [place] [address] [place] Phone: [phone] Mobile Phone: [phone] Relation: Friend Secondary Emergency Contact: Charyl Bigger [place] Phone: [phone] Mobile Phone: [phone] Relation: Nephew  Code Status:  FullCode Goals of care: Advanced Directive information Advanced Directives 12/04/2016  Does Patient Have a Medical Advance Directive? No  Type of Advance Directive -  Does patient want to make changes to medical advance directive? No - Patient declined  Copy of [place] in Chart? -  Would patient like information on creating a medical advance directive? -  Pre-existing out of facility DNR order (yellow form or pink MOST form) -     Chief Complaint  Patient presents with  . Acute Visit    Abnormal Labs    HPI:  Pt is a 76 y.o. female seen today at [facility]  for an acute visit for evaluation of abnormal labs. She has a medical history of HTN, CKD on dialysis, Depression, anxiety, RLS, Neuropathy among conditions. She is seen in her room today. She complains of  Non-productive cough. She recently completed levaquin for recent Pneumonia. Her recent lab results showed WBC 15.4, Hgb 7.0, HCT 22, BUN, CR 8.44, Ca 7.8 ( 12/03/2016). She denies any fever, chills or shortness of breath.   Past Medical History:  Diagnosis Date  . ANEMIA-NOS 05/29/2007  . ANXIETY 03/23/2010  .  Arthritis    "all over my body"  . Cancer of kidney ([place]) 10/07/2012   Followed per Dr Despina Pole, MD, urology, [place]   . Chronic bronchitis ([place])   . Chronic lower back pain   . Chronic neck pain 12/13/2010  . 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 COMMON MIGRAINE    "stress common migraines"  . Complication of anesthesia    after goiter removed-one vocal cord paralyzed  . Critical lower limb ischemia   . DEPRESSION 02/17/2007  . ESRD (end stage renal disease) on dialysis ([place])    "TTS. [address]" (10/20/2015)  . ESRD on hemodialysis ([place]) 02/17/2007   Started dialysis April 2014.  Gets HD at [facility] on TTS schedule.  Cause of ESRD was HTN.     . GERD (gastroesophageal reflux disease)   . GOUT 05/29/2007  . Heart murmur 10/02/2010   hx  . HYPERLIPIDEMIA 02/17/2007  . HYPERTENSION 02/17/2007  . HYPOTHYROIDISM 02/17/2007   s/p surgical removal of goiter in 1997  . Memory loss 01/24/2010  . OSTEOPENIA 09/22/2009  . Palpitations 09/08/2010  . PEPTIC ULCER DISEASE 05/29/2007   "when I was in college"  . PERIPHERAL NEUROPATHY 05/29/2007  . PERIPHERAL VASCULAR DISEASE 02/17/2007  . Personal history of colonic polyps 11/16/2009  . Renal insufficiency   . RESTLESS LEG SYNDROME 05/29/2007  . SEIZURE DISORDER 02/17/2007  . Type II diabetes mellitus ([place]) 02/17/2007   "haven't had it since ~ 2010" (10/20/2015)  . Vocal cord paralysis 1996   Past Surgical History:  Procedure Laterality Date  . A/V SHUNTOGRAM N/A 10/22/2016   Procedure: A/V Fistulagram - Right Arm;  Surgeon: Angelia Mould, MD;  Location: [place] [facility];  Service: Cardiovascular;  Laterality: N/A;  . AV FISTULA PLACEMENT  03/13/2012   Procedure: ARTERIOVENOUS (AV) FISTULA CREATION;  Surgeon: Conrad [place], MD;  Location: [place];  Service: Vascular;  Laterality: Right;  . BUNIONECTOMY Bilateral 1980  . CATARACT EXTRACTION, BILATERAL Bilateral    bilateral cataract removal  .  ELECTROCARDIOGRAM  05/29/2006  . ESOPHAGOSCOPY W/ BOTOX INJECTION  07/22/2012   Procedure: ESOPHAGOSCOPY WITH BOTOX INJECTION;  Surgeon: Rozetta Nunnery, MD;  Location: [place];  Service: ENT;  Laterality: N/A;  esophageal dilation  . INSERTION OF DIALYSIS CATHETER N/A 02/05/2013   Procedure: INSERTION OF DIALYSIS CATHETER;  Surgeon: Angelia Mould, MD;  Location: [place];  Service: Vascular;  Laterality: N/A;  Ultrasound guided  . LAPAROSCOPIC CHOLECYSTECTOMY    . PERIPHERAL VASCULAR BALLOON ANGIOPLASTY Right 10/22/2016   Procedure: Peripheral Vascular Balloon Angioplasty;  Surgeon: Angelia Mould, MD;  Location: [place] [facility];  Service: Cardiovascular;  Laterality: Right;  AV fistula  . SHOULDER OPEN ROTATOR CUFF REPAIR Left    Dr. Sharol Given  . stress Cardiolite  06/18/2006  . THYROID SURGERY  1997   goiter removal  . TIBIA IM NAIL INSERTION Left 11/18/2016   Procedure: INTRAMEDULLARY (IM) NAIL TIBIAL;  Surgeon: Mcarthur Rossetti, MD;  Location: [place];  Service: Orthopedics;  Laterality: Left;  . TOE AMPUTATION Left 2006  . TOE AMPUTATION Right Aug. 2015   2nd   . tranthoracic echocardiogram  06/18/2006  . VIDEO BRONCHOSCOPY Bilateral 10/25/2015   Procedure: VIDEO BRONCHOSCOPY WITH FLUORO;  Surgeon: Rigoberto Noel, MD;  Location: [place];  Service: Cardiopulmonary;  Laterality: Bilateral;    Allergies  Allergen Reactions  . Cephalosporins Itching and Rash    PATIENT DENIES THIS REACTION - Vanc and fortaz given at the same time in June for several doses at dialysis with systemic rash and itching; received zinacef 7/5 and had worseningsystemicrash/ itching and swelling of eyes - so unclear if allergic to either or both  . Nsaids Other (See Comments)    Renal dysfunction  . Pioglitazone Swelling    PATIENT DENIES THIS REACTON - edema  . Adhesive [Tape] Rash  . Vancomycin Rash    PATIENT DENIES THIS REACTON - See comment under cephalosporin     Allergies as of 12/04/2016      Reactions   Cephalosporins Itching, Rash   PATIENT DENIES THIS REACTION - Vanc and fortaz given at the same time in June for several doses at dialysis with systemic rash and itching; received zinacef 7/5 and had worseningsystemicrash/ itching and swelling of eyes - so unclear if allergic to either or both   Nsaids Other (See Comments)   Renal dysfunction   Pioglitazone Swelling   PATIENT DENIES THIS REACTON - edema   Adhesive [tape] Rash   Vancomycin Rash   PATIENT DENIES THIS REACTON - See comment under cephalosporin      Medication List       Accurate as of 12/04/16 11:47 PM. Always use your most recent med list.          acetaminophen 500 MG tablet Commonly known as:  TYLENOL Take 1,000 mg by mouth 3 (three) times daily.   albuterol 108 (90 Base) MCG/ACT inhaler Commonly known as:  PROVENTIL HFA;VENTOLIN HFA Inhale 2 puffs into the lungs  every 6 (six) hours as needed for wheezing or shortness of breath.   allopurinol 100 MG tablet Commonly known as:  ZYLOPRIM TAKE ONE TABLET BY MOUTH ONCE DAILY   atorvastatin 40 MG tablet Commonly known as:  LIPITOR TAKE ONE TABLET BY MOUTH ONCE DAILY   budesonide-formoterol 160-4.5 MCG/ACT inhaler Commonly known as:  SYMBICORT Inhale 2 puffs into the lungs 2 (two) times daily. Wait 1 min in between puffs, rinse mouth after use   cetirizine 10 MG tablet Commonly known as:  ZYRTEC Take 10 mg by mouth daily.   cinacalcet 30 MG tablet Commonly known as:  SENSIPAR Take 30 mg by mouth daily with supper.   clonazePAM 0.5 MG tablet Commonly known as:  KLONOPIN Take 0.5 mg by mouth 2 (two) times daily as needed for anxiety.   colchicine 0.6 MG tablet Take 0.5 tablets (0.3 mg total) by mouth 2 (two) times a week.   cyclobenzaprine 10 MG tablet Commonly known as:  FLEXERIL TAKE ONE TABLET BY MOUTH THREE TIMES DAILY AS NEEDED FOR MUSCLE SPASM   darbepoetin 25 MCG/0.42ML Soln injection Commonly  known as:  ARANESP Inject 25 mcg into the vein every 7 (seven) days. Every Thursday at dialysis   dextromethorphan-guaiFENesin 30-600 MG 12hr tablet Commonly known as:  MUCINEX DM Take 1 tablet by mouth 2 (two) times daily as needed for cough.   doxercalciferol 4 MCG/2ML injection Commonly known as:  HECTOROL Inject 1 mL (2 mcg total) into the vein Every Tuesday,Thursday,and Saturday with dialysis.   DULoxetine 30 MG capsule Commonly known as:  CYMBALTA Take 2 capsules (60 mg total) by mouth daily.   hydrOXYzine 10 MG tablet Commonly known as:  ATARAX/VISTARIL Take 20 mg by mouth at bedtime.   Lactulose 20 GM/30ML Soln Take 30 mLs by mouth daily as needed.   levothyroxine 175 MCG tablet Commonly known as:  SYNTHROID, LEVOTHROID Take 1 tablet (175 mcg total) by mouth daily before breakfast.   mometasone-formoterol 100-5 MCG/ACT Aero Commonly known as:  DULERA Inhale 2 puffs into the lungs 2 (two) times daily.   montelukast 10 MG tablet Commonly known as:  SINGULAIR TAKE ONE TABLET BY MOUTH AT BEDTIME. SCHEDULE APPOINTMENT FOR YEARLY PHYSICAL WITH LABS. MUST SEE DOCTOR FOR REFILLS   multivitamin Tabs tablet Take 1 tablet by mouth daily.   omeprazole 40 MG capsule Commonly known as:  PRILOSEC Take 1 capsule (40 mg total) by mouth daily.   ondansetron 8 MG tablet Commonly known as:  ZOFRAN Take 1 tablet (8 mg total) by mouth every 6 (six) hours as needed for nausea or vomiting.   OVER THE COUNTER MEDICATION Take 1 capsule by mouth daily. Beet Root 1000 mg per cap   OVER THE COUNTER MEDICATION 8 drops by Other route daily. Places 8 drops in 1 bottle of water  "Cell Food"   oxyCODONE 5 MG immediate release tablet Commonly known as:  Oxy IR/ROXICODONE Take 1 tablet (5 mg total) by mouth every 8 (eight) hours as needed.   polyethylene glycol packet Commonly known as:  MIRALAX / GLYCOLAX Take 17 g by mouth daily as needed for mild constipation.   pregabalin 50 MG  capsule Commonly known as:  LYRICA Take 50 mg by mouth at bedtime.   QUEtiapine 100 MG tablet Commonly known as:  SEROQUEL Take 1 tablet (100 mg total) by mouth at bedtime.   sevelamer carbonate 800 MG tablet Commonly known as:  RENVELA Take 2,400-3,200 mg by mouth See admin instructions. 4 tabs  with meals, 3 tabs with snacks   Turmeric Curcumin 500 MG Caps Take 500 mg by mouth daily.   UNABLE TO FIND Med Name: Med pass 240 mL by mouth 2 times daily   Vitamin D-3 5000 units Tabs Take 1 tablet by mouth daily.       Review of Systems  Constitutional: Negative for activity change, appetite change, chills, fatigue and fever.  HENT: Negative for congestion, rhinorrhea, sinus pressure, sneezing and sore throat.   Eyes: Negative.   Respiratory: Positive for cough. Negative for chest tightness, shortness of breath and wheezing.   Cardiovascular: Negative for chest pain, palpitations and leg swelling.  Gastrointestinal: Positive for nausea. Negative for abdominal distention, abdominal pain and diarrhea.  Endocrine: Negative.   Genitourinary: Negative for dysuria, flank pain, frequency and urgency.       Hemodialysis Tue, Thur, Sat   Musculoskeletal: Positive for gait problem.  Skin: Negative for color change, pallor and rash.  Neurological: Negative for dizziness, seizures, light-headedness and headaches.  Hematological: Does not bruise/bleed easily.  Psychiatric/Behavioral: Negative for agitation, confusion, hallucinations and sleep disturbance. The patient is not nervous/anxious.     Immunization History  Administered Date(s) Administered  . Influenza Split 04/24/2012  . Influenza Whole 04/25/2007, 05/06/2010  . Influenza, High Dose Seasonal PF 06/15/2015, 05/07/2016  . Influenza-Unspecified 05/18/2014  . PPD Test 10/28/2015, 11/24/2016  . Pneumococcal Conjugate-13 10/14/2013  . Pneumococcal Polysaccharide-23 04/25/2007  . Td 09/15/2008   Pertinent  Health Maintenance Due   Topic Date Due  . DEXA SCAN  12/12/2004  . INFLUENZA VACCINE  03/06/2017  . COLONOSCOPY  01/09/2018  . PNA vac Low Risk Adult  Completed   Fall Risk  03/01/2015  Falls in the past year? No    Vitals:   12/04/16 1428  BP: 110/60  Pulse: 74  Resp: 12  Temp: 97.2 F (36.2 C)  TempSrc: Oral  SpO2: 94%  Weight: 150 lb 8 oz (68.3 kg)  Height: '5\' 7"'$  (1.702 m)   Body mass index is 23.57 kg/m. Physical Exam  Constitutional: She is oriented to person, place, and time. No distress.  Thin Frail Elderly   HENT:  Head: Normocephalic.  Mouth/Throat: Oropharynx is clear and moist.  Eyes: Conjunctivae and EOM are normal. Pupils are equal, round, and reactive to light. Right eye exhibits no discharge. Left eye exhibits no discharge. No scleral icterus.  Neck: Normal range of motion. No JVD present.  Cardiovascular: Normal rate, regular rhythm, normal heart sounds and intact distal pulses.  Exam reveals no gallop and no friction rub.   No murmur heard. Pulmonary/Chest: Effort normal and breath sounds normal. No respiratory distress. She has no wheezes. She has no rales.  Abdominal: Soft. Bowel sounds are normal. She exhibits no distension and no mass. There is no tenderness. There is no rebound and no guarding.  Genitourinary:  Genitourinary Comments: Dialysis Tue, Thur, Sat   Musculoskeletal: Normal range of motion. She exhibits no edema or tenderness.  Lymphadenopathy:    She has no cervical adenopathy.  Neurological: She is oriented to person, place, and time.  Skin: Skin is warm and dry. No rash noted. No erythema. No pallor.  # 1. Left lower shin area surgical incision staples dry, clean and intact surrounding skin without any signs of infection.  # 2. Left upper shin area hematoma skin redness has improved.  # 3. Left heel unstageable ulcer; eschar noted no signs of infections. Provolone boots in place.   Psychiatric: She has a  normal mood and affect.    Labs reviewed:  Recent  Labs  11/18/16 0302  11/22/16 0725 11/23/16 1403 11/24/16 0712 12/03/16  NA 132*  < > 134* 133* 135 136*  K 5.1  < > 4.6 3.9 3.7 5.1  CL 93*  < > 93* 93* 94*  --   CO2 28  < > '28 29 25  '$ --   GLUCOSE 114*  < > 116* 136* 105*  --   BUN 19  < > 42* 28* 20 58*  CREATININE 5.00*  < > 7.38* 5.27* 3.66* 8.4*  CALCIUM 8.0*  < > 8.1* 7.9* 8.6*  --   PHOS 5.7*  --  4.7* 3.5  --   --   < > = values in this interval not displayed.  Recent Labs  11/18/16 0302 11/22/16 0725 11/23/16 1403  ALBUMIN 2.3* 2.0* 2.0*    Recent Labs  11/16/16 1554  11/22/16 0724 11/23/16 1403 11/24/16 0712 12/03/16  WBC 14.0*  < > 15.9* 17.6* 16.9* 15.4  NEUTROABS 10.9*  --   --   --   --   --   HGB 13.1  < > 7.8* 7.5* 8.8* 7.0*  HCT 38.9  < > 25.2* 24.0* 28.8* 22*  MCV 86.3  < > 89.0 90.6 92.3  --   PLT 349  < > 295 332 292 376  < > = values in this interval not displayed. Lab Results  Component Value Date   TSH 1.166 05/26/2015   Lab Results  Component Value Date   HGBA1C 5.0 03/01/2015   Lab Results  Component Value Date   CHOL 139 03/01/2015   HDL 61.90 03/01/2015   LDLCALC 42 03/01/2015   LDLDIRECT 104.4 08/21/2011   TRIG 178.0 (H) 03/01/2015   CHOLHDL 2 03/01/2015    Significant Diagnostic Results in last 30 days:  Dg Chest 2 View  Result Date: 11/16/2016 CLINICAL DATA:  Initial evaluation for acute trauma, fall. EXAM: CHEST  2 VIEW COMPARISON:  Prior radiograph from 05/29/2016. FINDINGS: Cardiac and mediastinal silhouettes are stable in size and contour, and remain within normal limits. Aortic atherosclerosis. Lungs mildly hypoinflated. Persistent irregular and linear opacity at the right lung apex, similar to previous. Finding suspected to largely reflect post treatment changes. Residual malignancy not excluded. Similarly, superimposed infection not excluded either. No other focal airspace disease. Mild scarring at the left lung base. No pulmonary edema or pleural effusion. No  pneumothorax. No acute osseus abnormality.  Scoliosis. IMPRESSION: 1. Persistent right upper lobe airspace disease. Findings suspected to largely reflect post treatment changes. Residual malignancy or superimposed infection not excluded. 2. Left basilar scarring. 3. Aortic atherosclerosis. Electronically Signed   By: Jeannine Boga M.D.   On: 11/16/2016 16:49   Dg Tibia/fibula Left  Result Date: 11/18/2016 CLINICAL DATA:  Left tibial ORIF. EXAM: DG C-ARM 61-120 MIN; LEFT TIBIA AND FIBULA - 2 VIEW COMPARISON:  Left tibia radiographs 11/16/2016. FLUOROSCOPY TIME:  Fluoroscopy Time:  2 minutes 47 seconds Number of Acquired Spot Images: 0 FINDINGS: Patient is status post placement of an intramedullary rod. There 2 distal and 2 proximal interlocking screws. There is near anatomic reduction of the complex spiral fracture in the distal tibia. The proximal fibular fracture is stable. IMPRESSION: 1. Interval ORIF of the left tibial fracture with an intramedullary rod and interlocking screws without radiographic evidence for complication. Electronically Signed   By: San Morelle M.D.   On: 11/18/2016 15:50   Dg Tibia/fibula Left  Result Date: 11/16/2016 CLINICAL DATA:  Initial evaluation for acute trauma, fall. EXAM: LEFT TIBIA AND FIBULA - 2 VIEW COMPARISON:  None. FINDINGS: Acute comminuted fracture of the mid-distal shaft of the left tibia. Slight lateral no definite fibular fracture. Overlying soft tissue swelling at the mid leg. Displacement of the main butterfly fragment. IMPRESSION: Acute comminuted fracture of the mid-distal left tibia with overlying soft tissue swelling. Electronically Signed   By: Jeannine Boga M.D.   On: 11/16/2016 16:46   Ct Angio Chest Pe W Or Wo Contrast  Result Date: 11/22/2016 CLINICAL DATA:  Inpatient. Acute hypoxic respiratory failure. 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  EXAM: CT ANGIOGRAPHY CHEST WITH CONTRAST TECHNIQUE: Multidetector CT imaging of the chest was performed using the standard protocol during bolus administration of intravenous contrast. Multiplanar CT image reconstructions and MIPs were obtained to evaluate the vascular anatomy. CONTRAST:  100 cc Isovue 370 IV. COMPARISON:  Chest radiograph from one day prior. 11/18/2015 PET-CT. 10/22/2015 chest CT. FINDINGS: Cardiovascular: The study is high quality for the evaluation of pulmonary embolism. There are no filling defects in the central, lobar, segmental or subsegmental pulmonary artery branches to suggest acute pulmonary embolism. Atherosclerotic nonaneurysmal thoracic aorta. Normal caliber pulmonary arteries. Normal heart size. No significant pericardial fluid/thickening. Left main, left anterior descending, left circumflex and right coronary atherosclerosis. Mediastinum/Nodes: Status post total thyroidectomy. Mildly patulous and otherwise unremarkable thoracic esophagus. No axillary adenopathy. No pathologically enlarged mediastinal or hilar nodes. Lungs/Pleura: No pneumothorax. No pleural effusion. Mild centrilobular emphysema with mild diffuse bronchial wall thickening. Masslike fibrosis in the right upper lobe measuring up to 6.0 x 3.9 cm (series 7/ image 25) with associated volume loss and distortion, compatible with post radiation change. Subsegmental bilateral lower lobe atelectasis. There is new patchy consolidation and ground-glass attenuation in the peripheral dependent left upper lobe. Mild patchy ground-glass opacity in the right middle lobe is new. Upper abdomen: Cholecystectomy.  Symmetric renal atrophy. Musculoskeletal: No aggressive appearing focal osseous lesions. Mild thoracic spondylosis. Review of the MIP images confirms the above findings. IMPRESSION: 1. No pulmonary embolism. 2. New patchy consolidation and ground-glass attenuation in the dependent peripheral left upper lobe and mild patchy  ground-glass opacity in the right middle lobe, suggestive of an infectious or inflammatory pneumonia. 3. Masslike fibrosis in the right upper lobe is compatible with radiation change. No definite findings of local tumor recurrence or metastatic disease in the chest. Continued chest CT surveillance is recommended . 4. Aortic atherosclerosis. Left main and 3 vessel coronary atherosclerosis. Electronically Signed   By: Ilona Sorrel M.D.   On: 11/22/2016 16:26   Dg Chest Port 1 View  Result Date: 11/24/2016 CLINICAL DATA:  Respiratory failure. EXAM: PORTABLE CHEST 1 VIEW COMPARISON:  11/22/2016 chest CT and 11/21/2016 chest radiograph FINDINGS: Surgical clips are present in the lower neck. The cardiac silhouette is unchanged and within normal limits for size. Aortic atherosclerosis is noted. The patient has taken a shallower inspiration than on the prior radiograph. Consolidation in the right lung apex is unchanged and compatible with previously described radiation fibrosis. Patchy and hazy opacities in both lung bases have increased from the prior chest radiograph and may have also mildly increased from the interval chest CT as well. No sizable pleural effusion or pneumothorax is identified. Degenerative changes are noted at the left glenohumeral joint, and there is thoracic dextroscoliosis. IMPRESSION: Increasing, patchy bibasilar opacities which may reflect pneumonia or atelectasis.  Electronically Signed   By: Logan Bores M.D.   On: 11/24/2016 08:08   Dg Chest Port 1 View  Result Date: 11/21/2016 CLINICAL DATA:  Hypoxemia EXAM: PORTABLE CHEST 1 VIEW COMPARISON:  11/16/2016, 05/29/2016 FINDINGS: Cardiac shadow is stable. Aortic calcifications are again seen. Chronic scarring in the right apex is again seen stable from the prior exam consistent with the known history of radiation therapy. No focal infiltrate or sizable effusion is noted. No acute bony abnormality is seen. IMPRESSION: Chronic changes in the  right apex.  No acute abnormality noted. Electronically Signed   By: Inez Catalina M.D.   On: 11/21/2016 12:21   Dg C-arm 1-60 Min  Result Date: 11/18/2016 CLINICAL DATA:  Left tibial ORIF. EXAM: DG C-ARM 61-120 MIN; LEFT TIBIA AND FIBULA - 2 VIEW COMPARISON:  Left tibia radiographs 11/16/2016. FLUOROSCOPY TIME:  Fluoroscopy Time:  2 minutes 47 seconds Number of Acquired Spot Images: 0 FINDINGS: Patient is status post placement of an intramedullary rod. There 2 distal and 2 proximal interlocking screws. There is near anatomic reduction of the complex spiral fracture in the distal tibia. The proximal fibular fracture is stable. IMPRESSION: 1. Interval ORIF of the left tibial fracture with an intramedullary rod and interlocking screws without radiographic evidence for complication. Electronically Signed   By: San Morelle M.D.   On: 11/18/2016 15:50  Assessment/Plan  1. Leukocytosis  WBC 15.4 ( 12/03/2016).Recent completed Levaquin for pneumonia.WBC trending down previous was 17.6; 16.9 she continues to complain of non-productive cough. Will repeat portable CXR Pa/Lat rule out Pneumonia. Urine specimen for U/A and C/S r/o UTI. Recheck CBC/diff 12/06/2016 with dialysis.   2. Anemia of renal disease   Hgb 7.0, HCT 22 ( 12/03/2016).Recheck CBC/diff 12/06/2016 with dialysis.will start iron.   3. unstageable ulcer of left heel Left heel black eschar. No signs of infections. Continue provolone boots for off load. Continue wound care.     Family/ staff Communication: Reviewed plan of care with patient and Facility Nurse supervisor.   Labs/tests ordered:  None

## 2016-12-04 NOTE — Telephone Encounter (Signed)
Returned call to [facility] from [facility]  Left message to return call   [phone]

## 2016-12-06 LAB — HEMOGLOBIN: HGB: 7.6 g/dL

## 2016-12-12 ENCOUNTER — Encounter: Payer: Self-pay | Admitting: *Deleted

## 2016-12-12 LAB — CBC AND DIFFERENTIAL
HCT: 25 % — AB (ref 36–46)
Hemoglobin: 7.9 g/dL — AB (ref 12.0–16.0)
Platelets: 410 10*3/uL — AB (ref 150–399)
WBC: 12.6 10^3/mL

## 2016-12-13 ENCOUNTER — Ambulatory Visit (INDEPENDENT_AMBULATORY_CARE_PROVIDER_SITE_OTHER): Payer: Medicare Other | Admitting: Orthopaedic Surgery

## 2016-12-14 ENCOUNTER — Ambulatory Visit (INDEPENDENT_AMBULATORY_CARE_PROVIDER_SITE_OTHER): Payer: Medicare Other

## 2016-12-14 ENCOUNTER — Encounter (INDEPENDENT_AMBULATORY_CARE_PROVIDER_SITE_OTHER): Payer: Self-pay | Admitting: Family

## 2016-12-14 ENCOUNTER — Ambulatory Visit (INDEPENDENT_AMBULATORY_CARE_PROVIDER_SITE_OTHER): Payer: Medicare Other | Admitting: Family

## 2016-12-14 VITALS — Ht 67.0 in | Wt 150.0 lb

## 2016-12-14 DIAGNOSIS — M79605 Pain in left leg: Secondary | ICD-10-CM

## 2016-12-14 DIAGNOSIS — S82202A Unspecified fracture of shaft of left tibia, initial encounter for closed fracture: Secondary | ICD-10-CM

## 2016-12-14 DIAGNOSIS — S82252D Displaced comminuted fracture of shaft of left tibia, subsequent encounter for closed fracture with routine healing: Secondary | ICD-10-CM

## 2016-12-14 DIAGNOSIS — S82402A Unspecified fracture of shaft of left fibula, initial encounter for closed fracture: Secondary | ICD-10-CM

## 2016-12-14 MED ORDER — OXYCODONE HCL 5 MG PO TABS: 5.0000 mg | ORAL_TABLET | Freq: Three times a day (TID) | ORAL | 0 refills | Status: DC | PRN

## 2016-12-14 NOTE — Progress Notes (Signed)
Office Visit Note   Patient: [name] [name]           Date of Birth: 1941-08-06           MRN: [id number] Visit Date: 12/14/2016              Requested by: Biagio Borg, MD [place], [place] [id number] PCP: Biagio Borg, MD  Chief Complaint  Patient presents with  . Left Leg - Routine Post Op    11/18/16 Intramedullary nail placement, left tibia      HPI: The patient is a 76 year old woman seen in follow up for left tibial fracture with IM nailing. IM nailing on 11/18/16. Complains of continued pain and swelling. Has been nonweight bearing. Has been working with PT for ROM and stretching to LLE.   Assessment & Plan: Visit Diagnoses:  1. Pain in left leg   2. Tibia/fibula fracture, left, closed, initial encounter   3. Closed displaced comminuted fracture of shaft of left tibia with routine healing, subsequent encounter     Plan: Follow up in office in 2 weeks. Have provided orders for [facility]. PT to continue working with Patient. Dry dressing changes to incisions. Silvadene dressings to left heel daily. Continue PRAFO and off loading the left heel.   Follow-Up Instructions: Return in about 2 weeks (around 12/28/2016).   Ortho Exam  Patient is alert, oriented, no adenopathy, well-dressed, normal affect, normal respiratory effort. Incisions are well healed. Does have swelling and erythema to LLE anteriorly. This is no warm. No cellulitis. No drainage. Posterior heel decubitus ulcer, this is 3 cm in diameter. This is covered with eschar. No surrounding erythema, drianage, odor or sign of infection.   Imaging: Xr Tibia/fibula Left  Result Date: 12/14/2016 Radiographs of left tibia and fibula show stable alignment of IM nailing. No complicating features.    Labs: Lab Results  Component Value Date   HGBA1C 6.4 11/29/2016   HGBA1C 5.0 03/01/2015   HGBA1C 6.1 (H) 03/31/2014   ESRSEDRATE 70 (H) 10/07/2012   ESRSEDRATE 43 (H) 01/24/2010   LABURIC 11.0 (H)  02/22/2011   REPTSTATUS 10/27/2015 FINAL 10/25/2015   GRAMSTAIN  10/25/2015    ABUNDANT WBC PRESENT,BOTH PMN AND MONONUCLEAR RARE SQUAMOUS EPITHELIAL CELLS PRESENT NO ORGANISMS SEEN Performed at [facility]  10/25/2015    YEAST CONSISTENT WITH CANDIDA SPECIES Performed at [facility] 02/26/2007    Orders:  Orders Placed This Encounter  Procedures  . XR Tibia/Fibula Left   No orders of the defined types were placed in this encounter.    Procedures: No procedures performed  Clinical Data: No additional findings.  ROS:  All other systems negative, except as noted in the HPI. Review of Systems  Constitutional: Negative for chills and fever.  Cardiovascular: Positive for leg swelling.  Skin: Positive for color change and wound.    Objective: Vital Signs: Ht '5\' 7"'$  (1.702 m)   Wt 150 lb (68 kg)   BMI 23.49 kg/m   Specialty Comments:  No specialty comments available.  PMFS History: Patient Active Problem List   Diagnosis Date Noted  . Tibia/fibula fracture, left, closed, initial encounter 11/16/2016  . Closed displaced comminuted fracture of shaft of left tibia   . Impingement syndrome of left shoulder 10/23/2016  . Chronic pain of left thumb 10/23/2016  . Squamous cell lung cancer ([place]) 10/26/2015  . Lung mass  10/22/2015  . Cough 06/15/2015  . Syncope 05/25/2015    Class: Acute  . Thrombocytopenia ([place]) 08/04/2014  . Acute encephalopathy 08/03/2014  . Non-compliant behavior 05/20/2014  . Pain of right upper arm 04/30/2014  . Other complications due to renal dialysis device, implant, and graft 04/30/2014  . Hyperparathyroidism, secondary renal ([place]) 04/12/2014  . Hyperkalemia 03/30/2014  . Hypoglycemia 03/30/2014  . ESRD on hemodialysis ([place]) 03/30/2014  . Anemia of renal disease 03/30/2014  . Peripheral neuropathy 12/12/2013  . Hives 02/09/2013  . Thinning of skin 02/04/2013  . 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 Unspecified constipation 01/02/2013  . Muscle spasm 01/02/2013  . COPD (chronic obstructive pulmonary disease) ([place]) 01/02/2013  . Cancer of kidney ([place]) 10/07/2012  . Insomnia 10/07/2012  . Dysuria 01/24/2012  . N&V (nausea and vomiting) 01/24/2012  . Generalized weakness 08/17/2011  . Gait instability 08/17/2011  . UTI (lower urinary tract infection) 08/17/2011  . Chronic pain of left knee 04/20/2011  . 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 Chronic neck pain 12/13/2010  . Osteopenia 12/13/2010  . Preventative health care 12/12/2010  . HEART MURMUR, HX OF 10/02/2010  . Palpitations 09/08/2010  . HYPERSOMNIA 05/26/2010  . Anxiety state 03/23/2010  . Memory loss 01/24/2010  . Backache 12/12/2009  . Personal history of colonic polyps 11/16/2009  . MENOPAUSAL DISORDER 05/25/2009  . SHOULDER PAIN, LEFT, CHRONIC 09/15/2008  . CERVICAL RADICULOPATHY, LEFT 05/27/2008  . Osteoarth NOS-L/Leg 03/11/2008  . 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 FATIGUE 09/17/2007  . Abnormality of gait 07/30/2007  . Gout 05/29/2007  . RESTLESS LEG SYNDROME 05/29/2007  . COMMON MIGRAINE 05/29/2007  . PERIPHERAL NEUROPATHY 05/29/2007  . Asthmatic bronchitis , chronic ([place]) 05/29/2007  . Peptic ulcer 05/29/2007  . Hypothyroidism 02/17/2007  . Impaired glucose tolerance 02/17/2007  . HLD (hyperlipidemia) 02/17/2007  . DEPRESSION 02/17/2007  . Essential hypertension 02/17/2007  . Peripheral vascular disease ([place]) 02/17/2007  . ALLERGIC RHINITIS 02/17/2007  . GERD 02/17/2007  . Seizure ([place]) 02/17/2007   Past Medical History:  Diagnosis Date  . ANEMIA-NOS 05/29/2007  . ANXIETY 03/23/2010  . Arthritis    "all over my body"  . Cancer of kidney ([place]) 10/07/2012   Followed per Dr Despina Pole, MD, urology, [place]   . Chronic bronchitis ([place])   . Chronic lower back pain   . Chronic neck pain 12/13/2010  . 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 COMMON  MIGRAINE    "stress common migraines"  . Complication of anesthesia    after goiter removed-one vocal cord paralyzed  . Critical lower limb ischemia   . DEPRESSION 02/17/2007  . ESRD (end stage renal disease) on dialysis ([place])    "TTS. [address]" (10/20/2015)  . ESRD on hemodialysis ([place]) 02/17/2007   Started dialysis April 2014.  Gets HD at [facility] on TTS schedule.  Cause of ESRD was HTN.     . GERD (gastroesophageal reflux disease)   . GOUT 05/29/2007  . Heart murmur 10/02/2010   hx  . HYPERLIPIDEMIA 02/17/2007  . HYPERTENSION 02/17/2007  . HYPOTHYROIDISM 02/17/2007   s/p surgical removal of goiter in 1997  . Memory loss 01/24/2010  . OSTEOPENIA 09/22/2009  . Palpitations 09/08/2010  . PEPTIC ULCER DISEASE 05/29/2007   "when I was in college"  . PERIPHERAL NEUROPATHY 05/29/2007  . PERIPHERAL VASCULAR DISEASE 02/17/2007  . Personal history of colonic polyps 11/16/2009  . Renal insufficiency   . RESTLESS LEG SYNDROME 05/29/2007  . SEIZURE  DISORDER 02/17/2007  . Type II diabetes mellitus ([place]) 02/17/2007   "haven't had it since ~ 2010" (10/20/2015)  . Vocal cord paralysis 1996    Family History  Problem Relation Age of Onset  . Dementia Mother   . Hypertension Mother   . Coronary artery disease Other   . Hyperlipidemia Other   . Hypertension Other   . Ovarian cancer Other   . Stroke Other   . Hypertension Sister   . Hypertension Brother   . Heart attack Brother   . Stroke Brother     Past Surgical History:  Procedure Laterality Date  . A/V SHUNTOGRAM N/A 10/22/2016   Procedure: A/V Fistulagram - Right Arm;  Surgeon: Angelia Mould, MD;  Location: [facility];  Service: Cardiovascular;  Laterality: N/A;  . AV FISTULA PLACEMENT  03/13/2012   Procedure: ARTERIOVENOUS (AV) FISTULA CREATION;  Surgeon: Conrad Toro Canyon, MD;  Location: [facility];  Service: Vascular;  Laterality: Right;  . BUNIONECTOMY Bilateral 1980  . CATARACT EXTRACTION, BILATERAL Bilateral    bilateral  cataract removal  . ELECTROCARDIOGRAM  05/29/2006  . ESOPHAGOSCOPY W/ BOTOX INJECTION  07/22/2012   Procedure: ESOPHAGOSCOPY WITH BOTOX INJECTION;  Surgeon: Rozetta Nunnery, MD;  Location: [facility];  Service: ENT;  Laterality: N/A;  esophageal dilation  . INSERTION OF DIALYSIS CATHETER N/A 02/05/2013   Procedure: INSERTION OF DIALYSIS CATHETER;  Surgeon: Angelia Mould, MD;  Location: [facility];  Service: Vascular;  Laterality: N/A;  Ultrasound guided  . LAPAROSCOPIC CHOLECYSTECTOMY    . PERIPHERAL VASCULAR BALLOON ANGIOPLASTY Right 10/22/2016   Procedure: Peripheral Vascular Balloon Angioplasty;  Surgeon: Angelia Mould, MD;  Location: [facility];  Service: Cardiovascular;  Laterality: Right;  AV fistula  . SHOULDER OPEN ROTATOR CUFF REPAIR Left    Dr. Sharol Given  . stress Cardiolite  06/18/2006  . THYROID SURGERY  1997   goiter removal  . TIBIA IM NAIL INSERTION Left 11/18/2016   Procedure: INTRAMEDULLARY (IM) NAIL TIBIAL;  Surgeon: Mcarthur Rossetti, MD;  Location: [facility];  Service: Orthopedics;  Laterality: Left;  . TOE AMPUTATION Left 2006  . TOE AMPUTATION Right Aug. 2015   2nd   . tranthoracic echocardiogram  06/18/2006  . VIDEO BRONCHOSCOPY Bilateral 10/25/2015   Procedure: VIDEO BRONCHOSCOPY WITH FLUORO;  Surgeon: Rigoberto Noel, MD;  Location: [facility];  Service: Cardiopulmonary;  Laterality: Bilateral;   Social History   Occupational History  . disabled Retired    c-spine and back pain   Social History Main Topics  . Smoking status: Former Smoker    Packs/day: 0.25    Years: 51.00    Types: Cigarettes  . Smokeless tobacco: Never Used     Comment: USING E CIG  . Alcohol use No  . Drug use: No  . Sexual activity: Not Currently

## 2016-12-24 ENCOUNTER — Encounter: Payer: Self-pay | Admitting: Family

## 2016-12-24 ENCOUNTER — Non-Acute Institutional Stay (SKILLED_NURSING_FACILITY): Payer: Medicare Other | Admitting: Family

## 2016-12-24 DIAGNOSIS — I10 Essential (primary) hypertension: Secondary | ICD-10-CM

## 2016-12-24 DIAGNOSIS — J449 Chronic obstructive pulmonary disease, unspecified: Secondary | ICD-10-CM | POA: Diagnosis not present

## 2016-12-24 DIAGNOSIS — E782 Mixed hyperlipidemia: Secondary | ICD-10-CM | POA: Diagnosis not present

## 2016-12-24 DIAGNOSIS — E039 Hypothyroidism, unspecified: Secondary | ICD-10-CM | POA: Diagnosis not present

## 2016-12-24 NOTE — Progress Notes (Signed)
Location:    [facility] Room Number: [place] of Service:  SNF [phone]) Provider: Myesha Stillion FNP-C   Biagio Borg, MD  Patient Care Team: Biagio Borg, MD as PCP - Dellia Nims, MD as Consulting Physician (Nephrology) Dixie Dials, MD as Consulting Physician (Cardiology) Trula Slade, DPM as Consulting Physician (Podiatry) Center, [facility]  Extended Emergency Contact Information Primary Emergency Contact: Dunston,Celesta Address: [address]          [place], [place] [address] [place] Phone: [phone] Mobile Phone: [phone] Relation: Friend Secondary Emergency Contact: Charyl Bigger [place] Phone: [phone] Mobile Phone: [phone] Relation: Nephew  Code Status:  FullCode Goals of care: Advanced Directive information Advanced Directives 12/04/2016  Does Patient Have a Medical Advance Directive? No  Type of Advance Directive -  Does patient want to make changes to medical advance directive? No - Patient declined  Copy of [place] in Chart? -  Would patient like information on creating a medical advance directive? -  Pre-existing out of facility DNR order (yellow form or pink MOST form) -     Chief Complaint  Patient presents with  . Medical Management of Chronic Issues    Routine Visit     HPI:  Pt is a 76 y.o. female seen today at [facility]  for medical management of chronic issues.She has a medical history of HTN, CKD on dialysis, Depression, anxiety, RLS, Neuropathy among conditions. She is seen in her room today. She state still has a cough though much improvement. She was seen by [facility] 12/13/2016 IM left tibia/fibula healing well advanced to touch down WTB. Left heel ulcer also progressive healing daily silvadene dressing ordered.   Past Medical History:  Diagnosis Date  . ANEMIA-NOS  05/29/2007  . ANXIETY 03/23/2010  . Arthritis    "all over my body"  . Cancer of kidney ([place]) 10/07/2012   Followed per Dr Despina Pole, MD, urology, [place]   . Chronic bronchitis ([place])   . Chronic lower back pain   . Chronic neck pain 12/13/2010  . 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 COMMON MIGRAINE    "stress common migraines"  . Complication of anesthesia    after goiter removed-one vocal cord paralyzed  . Critical lower limb ischemia   . DEPRESSION 02/17/2007  . ESRD (end stage renal disease) on dialysis ([place])    "TTS. [address]" (10/20/2015)  . ESRD on hemodialysis ([place]) 02/17/2007   Started dialysis April 2014.  Gets HD at [facility] on TTS schedule.  Cause of ESRD was HTN.     . GERD (gastroesophageal reflux disease)   . GOUT 05/29/2007  . Heart murmur 10/02/2010   hx  . HYPERLIPIDEMIA 02/17/2007  . HYPERTENSION 02/17/2007  . HYPOTHYROIDISM 02/17/2007   s/p surgical removal of goiter in 1997  . Memory loss 01/24/2010  . OSTEOPENIA 09/22/2009  . Palpitations 09/08/2010  . PEPTIC ULCER DISEASE 05/29/2007   "when I was in college"  . PERIPHERAL NEUROPATHY 05/29/2007  . PERIPHERAL VASCULAR DISEASE 02/17/2007  . Personal history of colonic polyps 11/16/2009  . Renal insufficiency   . RESTLESS LEG SYNDROME 05/29/2007  . SEIZURE DISORDER 02/17/2007  . Type II diabetes mellitus ([place]) 02/17/2007   "haven't had it since ~ 2010" (10/20/2015)  . Vocal cord paralysis 1996   Past  Surgical History:  Procedure Laterality Date  . A/V SHUNTOGRAM N/A 10/22/2016   Procedure: A/V Fistulagram - Right Arm;  Surgeon: Angelia Mould, MD;  Location: [place] [facility];  Service: Cardiovascular;  Laterality: N/A;  . AV FISTULA PLACEMENT  03/13/2012   Procedure: ARTERIOVENOUS (AV) FISTULA CREATION;  Surgeon: Conrad Weston Lakes, MD;  Location: [place];  Service: Vascular;  Laterality: Right;  . BUNIONECTOMY Bilateral 1980  . CATARACT EXTRACTION, BILATERAL Bilateral     bilateral cataract removal  . ELECTROCARDIOGRAM  05/29/2006  . ESOPHAGOSCOPY W/ BOTOX INJECTION  07/22/2012   Procedure: ESOPHAGOSCOPY WITH BOTOX INJECTION;  Surgeon: Rozetta Nunnery, MD;  Location: [place];  Service: ENT;  Laterality: N/A;  esophageal dilation  . INSERTION OF DIALYSIS CATHETER N/A 02/05/2013   Procedure: INSERTION OF DIALYSIS CATHETER;  Surgeon: Angelia Mould, MD;  Location: [place];  Service: Vascular;  Laterality: N/A;  Ultrasound guided  . LAPAROSCOPIC CHOLECYSTECTOMY    . PERIPHERAL VASCULAR BALLOON ANGIOPLASTY Right 10/22/2016   Procedure: Peripheral Vascular Balloon Angioplasty;  Surgeon: Angelia Mould, MD;  Location: [place] CV LAB;  Service: Cardiovascular;  Laterality: Right;  AV fistula  . SHOULDER OPEN ROTATOR CUFF REPAIR Left    Dr. Sharol Given  . stress Cardiolite  06/18/2006  . THYROID SURGERY  1997   goiter removal  . TIBIA IM NAIL INSERTION Left 11/18/2016   Procedure: INTRAMEDULLARY (IM) NAIL TIBIAL;  Surgeon: Mcarthur Rossetti, MD;  Location: [place];  Service: Orthopedics;  Laterality: Left;  . TOE AMPUTATION Left 2006  . TOE AMPUTATION Right Aug. 2015   2nd   . tranthoracic echocardiogram  06/18/2006  . VIDEO BRONCHOSCOPY Bilateral 10/25/2015   Procedure: VIDEO BRONCHOSCOPY WITH FLUORO;  Surgeon: Rigoberto Noel, MD;  Location: [place];  Service: Cardiopulmonary;  Laterality: Bilateral;    Allergies  Allergen Reactions  . Cephalosporins Itching and Rash    PATIENT DENIES THIS REACTION - Vanc and fortaz given at the same time in June for several doses at dialysis with systemic rash and itching; received zinacef 7/5 and had worseningsystemicrash/ itching and swelling of eyes - so unclear if allergic to either or both  . Nsaids Other (See Comments)    Renal dysfunction  . Pioglitazone Swelling    PATIENT DENIES THIS REACTON - edema  . Adhesive [Tape] Rash  . Vancomycin Rash    PATIENT DENIES THIS REACTON - See  comment under cephalosporin    Allergies as of 12/24/2016      Reactions   Cephalosporins Itching, Rash   PATIENT DENIES THIS REACTION - Vanc and fortaz given at the same time in June for several doses at dialysis with systemic rash and itching; received zinacef 7/5 and had worseningsystemicrash/ itching and swelling of eyes - so unclear if allergic to either or both   Nsaids Other (See Comments)   Renal dysfunction   Pioglitazone Swelling   PATIENT DENIES THIS REACTON - edema   Adhesive [tape] Rash   Vancomycin Rash   PATIENT DENIES THIS REACTON - See comment under cephalosporin      Medication List       Accurate as of 12/24/16  4:59 PM. Always use your most recent med list.          acetaminophen 500 MG tablet Commonly known as:  TYLENOL Take 1,000 mg by mouth 3 (three) times daily.   albuterol 108 (90 Base) MCG/ACT inhaler Commonly known as:  PROVENTIL HFA;VENTOLIN HFA Inhale 2  puffs into the lungs every 6 (six) hours as needed for wheezing or shortness of breath.   allopurinol 100 MG tablet Commonly known as:  ZYLOPRIM TAKE ONE TABLET BY MOUTH ONCE DAILY   atorvastatin 40 MG tablet Commonly known as:  LIPITOR TAKE ONE TABLET BY MOUTH ONCE DAILY   budesonide-formoterol 160-4.5 MCG/ACT inhaler Commonly known as:  SYMBICORT Inhale 2 puffs into the lungs 2 (two) times daily. Wait 1 min in between puffs, rinse mouth after use   cetirizine 10 MG tablet Commonly known as:  ZYRTEC Take 10 mg by mouth daily.   cinacalcet 30 MG tablet Commonly known as:  SENSIPAR Take 30 mg by mouth daily with supper.   clonazePAM 0.5 MG tablet Commonly known as:  KLONOPIN Take 0.5 mg by mouth 2 (two) times daily as needed for anxiety.   colchicine 0.6 MG tablet Take 0.5 tablets (0.3 mg total) by mouth 2 (two) times a week.   cyclobenzaprine 10 MG tablet Commonly known as:  FLEXERIL TAKE ONE TABLET BY MOUTH THREE TIMES DAILY AS NEEDED FOR MUSCLE SPASM   darbepoetin 25  MCG/0.42ML Soln injection Commonly known as:  ARANESP Inject 25 mcg into the vein every 7 (seven) days. Every Thursday at dialysis   dextromethorphan-guaiFENesin 30-600 MG 12hr tablet Commonly known as:  MUCINEX DM Take 1 tablet by mouth 2 (two) times daily as needed for cough.   doxercalciferol 4 MCG/2ML injection Commonly known as:  HECTOROL Inject 1 mL (2 mcg total) into the vein Every Tuesday,Thursday,and Saturday with dialysis.   DULoxetine 30 MG capsule Commonly known as:  CYMBALTA Take 2 capsules (60 mg total) by mouth daily.   feeding supplement (PRO-STAT SUGAR FREE 64) Liqd Take 30 mLs by mouth daily.   hydrOXYzine 10 MG tablet Commonly known as:  ATARAX/VISTARIL Take 20 mg by mouth at bedtime.   Lactulose 20 GM/30ML Soln Take 30 mLs by mouth daily as needed.   levothyroxine 175 MCG tablet Commonly known as:  SYNTHROID, LEVOTHROID Take 1 tablet (175 mcg total) by mouth daily before breakfast.   mometasone-formoterol 100-5 MCG/ACT Aero Commonly known as:  DULERA Inhale 2 puffs into the lungs 2 (two) times daily.   montelukast 10 MG tablet Commonly known as:  SINGULAIR TAKE ONE TABLET BY MOUTH AT BEDTIME. SCHEDULE APPOINTMENT FOR YEARLY PHYSICAL WITH LABS. MUST SEE DOCTOR FOR REFILLS   multivitamin Tabs tablet Take 1 tablet by mouth daily.   omeprazole 40 MG capsule Commonly known as:  PRILOSEC Take 1 capsule (40 mg total) by mouth daily.   ondansetron 8 MG tablet Commonly known as:  ZOFRAN Take 1 tablet (8 mg total) by mouth every 6 (six) hours as needed for nausea or vomiting.   OVER THE COUNTER MEDICATION Take 1 capsule by mouth daily. Beet Root 1000 mg per cap   OVER THE COUNTER MEDICATION 8 drops by Other route daily. Places 8 drops in 1 bottle of water  "Cell Food"   oxyCODONE 5 MG immediate release tablet Commonly known as:  Oxy IR/ROXICODONE Take 1 tablet (5 mg total) by mouth every 8 (eight) hours as needed.   polyethylene glycol  packet Commonly known as:  MIRALAX / GLYCOLAX Take 17 g by mouth daily as needed for mild constipation.   pregabalin 50 MG capsule Commonly known as:  LYRICA Take 50 mg by mouth at bedtime.   QUEtiapine 100 MG tablet Commonly known as:  SEROQUEL Take 1 tablet (100 mg total) by mouth at bedtime.   sevelamer  carbonate 800 MG tablet Commonly known as:  RENVELA Take 2,400-3,200 mg by mouth See admin instructions. 4 tabs with meals, 3 tabs with snacks   Turmeric Curcumin 500 MG Caps Take 500 mg by mouth daily.   UNABLE TO FIND Med Name: Med pass 120 mL by mouth 2 times daily   Vitamin D-3 5000 units Tabs Take 1 tablet by mouth daily.       Review of Systems  Constitutional: Negative for activity change, appetite change, chills, fatigue and fever.  HENT: Negative for congestion, rhinorrhea, sinus pressure, sneezing and sore throat.   Eyes: Negative.   Respiratory: Negative for chest tightness, shortness of breath and wheezing.        Cough has improved   Cardiovascular: Negative for chest pain, palpitations and leg swelling.  Gastrointestinal: Negative for abdominal distention, abdominal pain, diarrhea and nausea.  Endocrine: Negative.   Genitourinary: Negative for dysuria, flank pain, frequency and urgency.       Hemodialysis Tue, Thur, Sat   Musculoskeletal: Positive for gait problem.  Skin: Negative for color change, pallor and rash.  Neurological: Negative for dizziness, seizures, light-headedness and headaches.  Hematological: Does not bruise/bleed easily.  Psychiatric/Behavioral: Negative for agitation, confusion, hallucinations and sleep disturbance. The patient is not nervous/anxious.     Immunization History  Administered Date(s) Administered  . Influenza Split 04/24/2012  . Influenza Whole 04/25/2007, 05/06/2010  . Influenza, High Dose Seasonal PF 06/15/2015, 05/07/2016  . Influenza-Unspecified 05/18/2014  . PPD Test 10/28/2015, 11/24/2016  . Pneumococcal  Conjugate-13 10/14/2013  . Pneumococcal Polysaccharide-23 04/25/2007  . Td 09/15/2008   Pertinent  Health Maintenance Due  Topic Date Due  . DEXA SCAN  12/12/2004  . INFLUENZA VACCINE  03/06/2017  . COLONOSCOPY  01/09/2018  . PNA vac Low Risk Adult  Completed   Fall Risk  03/01/2015  Falls in the past year? No    Vitals:   12/24/16 1526  BP: 124/74  Pulse: 76  Resp: 18  Temp: 97.8 F (36.6 C)  TempSrc: Oral  SpO2: 93%  Weight: 144 lb 12.8 oz (65.7 kg)  Height: '5\' 7"'$  (1.702 m)   Body mass index is 22.68 kg/m. Physical Exam  Constitutional: She is oriented to person, place, and time. No distress.  Thin Frail elderly in no acute distress  HENT:  Head: Normocephalic.  Mouth/Throat: Oropharynx is clear and moist.  Eyes: Conjunctivae and EOM are normal. Pupils are equal, round, and reactive to light. Right eye exhibits no discharge. Left eye exhibits no discharge. No scleral icterus.  Neck: Normal range of motion. No JVD present.  Cardiovascular: Normal rate, regular rhythm, normal heart sounds and intact distal pulses.  Exam reveals no gallop and no friction rub.   No murmur heard. Pulmonary/Chest: Effort normal and breath sounds normal. No respiratory distress. She has no wheezes. She has no rales.  Abdominal: Soft. Bowel sounds are normal. She exhibits no distension and no mass. There is no tenderness. There is no rebound and no guarding.  Genitourinary:  Genitourinary Comments: Dialysis Tue, Thur, Sat   Musculoskeletal: Normal range of motion. She exhibits no edema or tenderness.  Lymphadenopathy:    She has no cervical adenopathy.  Neurological: She is oriented to person, place, and time.  Skin: Skin is warm and dry. No rash noted. No erythema. No pallor.  # 1. Left thigh/knee staples dry, clean and intact surrounding skin without any signs of infection.   # 2. Left upper shin area hematoma much improvement  #  3. Left heel unstageable ulcer; eschar purple in color no  signs of infections.    Psychiatric: She has a normal mood and affect.    Labs reviewed:  Recent Labs  11/18/16 0302  11/22/16 0725 11/23/16 1403 11/24/16 0712 11/29/16 12/03/16  NA 132*  < > 134* 133* 135 130*  130* 136*  K 5.1  < > 4.6 3.9 3.7 5.2  5.2 5.1  CL 93*  < > 93* 93* 94*  --   --   CO2 28  < > '28 29 25  '$ --   --   GLUCOSE 114*  < > 116* 136* 105*  --   --   BUN 19  < > 42* 28* 20 90* 58*  CREATININE 5.00*  < > 7.38* 5.27* 3.66* 10.4* 8.4*  CALCIUM 8.0*  < > 8.1* 7.9* 8.6*  --   --   PHOS 5.7*  --  4.7* 3.5  --   --   --   < > = values in this interval not displayed.  Recent Labs  11/18/16 0302 11/22/16 0725 11/23/16 1403 11/29/16  ALKPHOS  --   --   --  149*  ALBUMIN 2.3* 2.0* 2.0*  --     Recent Labs  11/16/16 1554  11/22/16 0724 11/23/16 1403 11/24/16 0712 11/29/16 12/03/16 12/06/16 12/12/16  WBC 14.0*  < > 15.9* 17.6* 16.9* 20.0  20 15.4  --  12.6  NEUTROABS 10.9*  --   --   --   --   --   --   --   --   HGB 13.1  < > 7.8* 7.5* 8.8* 9.0* 7.0* 7.6 7.9*  HCT 38.9  < > 25.2* 24.0* 28.8* 29.5 22*  --  25*  MCV 86.3  < > 89.0 90.6 92.3  --   --   --   --   PLT 349  < > 295 332 292 510* 376  --  410*  < > = values in this interval not displayed. Lab Results  Component Value Date   TSH 1.166 05/26/2015   Lab Results  Component Value Date   HGBA1C 6.4 11/29/2016   Lab Results  Component Value Date   CHOL 139 03/01/2015   HDL 61.90 03/01/2015   LDLCALC 42 03/01/2015   LDLDIRECT 104.4 08/21/2011   TRIG 178.0 (H) 03/01/2015   CHOLHDL 2 03/01/2015   Assessment/Plan HTN B/p stable.continue to monitor. Check Hgb A1C 12/25/2016    Hypothyroidism  Continue on levothyroxine 175 mcg tablet. Check TSH level 12/25/2016  COPD Breathing stable. Continue on Singulair,Dulera, Symbicort and albuterol.   Hyperlipidemia  Continue on atorvastatin and heart health diet. Check fasting lipid panel 12/25/2016.   Family/ staff Communication: Reviewed plan of  care with patient and Facility Nurse supervisor.   Labs/tests ordered:  TSH level, Hgb A1C and Fasting Lipid panel 12/25/2016.

## 2016-12-26 ENCOUNTER — Ambulatory Visit (INDEPENDENT_AMBULATORY_CARE_PROVIDER_SITE_OTHER): Payer: Medicare Other | Admitting: Orthopaedic Surgery

## 2016-12-26 LAB — LIPID PANEL
CHOLESTEROL: 94 mg/dL (ref 0–200)
HDL: 47 mg/dL (ref 35–70)
LDL Cholesterol: 34 mg/dL
TRIGLYCERIDES: 67 mg/dL (ref 40–160)

## 2016-12-26 LAB — HEMOGLOBIN A1C
A1C: 5.6
HEMOGLOBIN A1C: 5.6

## 2016-12-26 LAB — TSH
TSH: 2.457
TSH: 2.46 u[IU]/mL (ref 0.41–5.90)

## 2016-12-27 ENCOUNTER — Ambulatory Visit (INDEPENDENT_AMBULATORY_CARE_PROVIDER_SITE_OTHER): Payer: Medicare Other | Admitting: Physician Assistant

## 2016-12-27 ENCOUNTER — Encounter: Payer: Self-pay | Admitting: *Deleted

## 2016-12-27 DIAGNOSIS — S82252D Displaced comminuted fracture of shaft of left tibia, subsequent encounter for closed fracture with routine healing: Secondary | ICD-10-CM

## 2016-12-27 NOTE — Progress Notes (Signed)
[name] [name] returns today follow-up of her left tibia fracture which she underwent an IM nailing of on 11/18/2016. She was seen 2 weeks ago in the office by Marjory Lies. She's been touchdown weightbearing on the left leg. She has complaints about her medicines not being given as prescribed at the facility she is admitted at this point time.  Physical exam: General well-developed well-nourished female in no acute distress. Left lower extremity: Staples over the proximal incision at the knee and the distal incision Staples have been removed well-healed. There is no signs of infection. She has ecchymosis of the left lower leg. Left calf supple nontender. Has a superficial heel ulcer (no sign of infection. The heel ulcer is covered with eschar.  Plan: Continue touchdown weightbearing as tolerated left leg. Follow up in 2 weeks Dr. Ninfa Linden for radiographs of the left lower leg. Elevate/float heels when in bed. Patient's requested that meds be given per [facility]. Staples were removed from the proximal incision today Steri-Strips applied. She's able to get her wounds wet.

## 2017-01-09 ENCOUNTER — Ambulatory Visit (INDEPENDENT_AMBULATORY_CARE_PROVIDER_SITE_OTHER): Payer: Medicare Other | Admitting: Physician Assistant

## 2017-01-09 ENCOUNTER — Encounter (INDEPENDENT_AMBULATORY_CARE_PROVIDER_SITE_OTHER): Payer: Self-pay | Admitting: Physician Assistant

## 2017-01-09 ENCOUNTER — Ambulatory Visit (HOSPITAL_COMMUNITY)
Admission: RE | Admit: 2017-01-09 | Discharge: 2017-01-09 | Disposition: A | Discharge status: Home / Self Care | Payer: No Typology Code available for payment source | Source: Ambulatory Visit | Attending: Physician Assistant | Admitting: Physician Assistant

## 2017-01-09 ENCOUNTER — Ambulatory Visit (INDEPENDENT_AMBULATORY_CARE_PROVIDER_SITE_OTHER): Payer: Medicare Other

## 2017-01-09 DIAGNOSIS — X58XXXA Exposure to other specified factors, initial encounter: Secondary | ICD-10-CM | POA: Diagnosis not present

## 2017-01-09 DIAGNOSIS — S82202A Unspecified fracture of shaft of left tibia, initial encounter for closed fracture: Secondary | ICD-10-CM

## 2017-01-09 DIAGNOSIS — M7989 Other specified soft tissue disorders: Secondary | ICD-10-CM | POA: Insufficient documentation

## 2017-01-09 DIAGNOSIS — M79609 Pain in unspecified limb: Secondary | ICD-10-CM | POA: Insufficient documentation

## 2017-01-09 DIAGNOSIS — S82402A Unspecified fracture of shaft of left fibula, initial encounter for closed fracture: Secondary | ICD-10-CM

## 2017-01-09 NOTE — Progress Notes (Signed)
[name] [name] returns today status post IM nailing of left tibia fracture 11/18/2016. Senna chest pain shortness breath. She's having calf pain in left leg is keeping her awake. She was having increased pain with touchdown weightbearing on left leg and in placed per her account by therapy at skilled facility is nonweightbearing. She's been on no anticoagulants.  Physical exam: General well-developed well-nourished female in no acute distress. Affect appropriate. Left lower leg surgical incisions are all healing well no signs of infection. Left calf is supple tenderness in the calf with palpation. She has full extension and flexion beyond 90 of the left knee. She is able do a straight leg raise.  Radiographs left tibia 2 views: Early signs of consolidation seen. No change in overall position alignment of the left tibia fracture. No hardware failure.  Plan: Weightbearing as tolerated left leg. We'll obtain a ultrasound of her left lower leg to rule out DVT today at 1600 hrs. in [facility]. Follow-up in one month for AP and lateral views of the left tibia.

## 2017-01-09 NOTE — Progress Notes (Signed)
*  Preliminary Results* Left lower extremity venous duplex completed. Left lower extremity is negative for deep vein thrombosis. There is no evidence of left Baker's cyst.  01/09/2017 4:32 PM  Maudry Mayhew, BS, RVT, RDCS, RDMS

## 2017-01-18 ENCOUNTER — Inpatient Hospital Stay (HOSPITAL_COMMUNITY)
Admission: EM | Admit: 2017-01-18 | Discharge: 2017-01-19 | DRG: 054 | Disposition: A | Discharge status: Other Acute Inpatient Hospital | Payer: Medicare Other | Attending: Internal Medicine | Admitting: Internal Medicine

## 2017-01-18 ENCOUNTER — Encounter (HOSPITAL_COMMUNITY): Payer: Self-pay | Admitting: *Deleted

## 2017-01-18 ENCOUNTER — Emergency Department (HOSPITAL_COMMUNITY): Payer: Medicare Other

## 2017-01-18 DIAGNOSIS — G936 Cerebral edema: Secondary | ICD-10-CM | POA: Diagnosis not present

## 2017-01-18 DIAGNOSIS — C3490 Malignant neoplasm of unspecified part of unspecified bronchus or lung: Secondary | ICD-10-CM | POA: Diagnosis present

## 2017-01-18 DIAGNOSIS — G2581 Restless legs syndrome: Secondary | ICD-10-CM | POA: Diagnosis present

## 2017-01-18 DIAGNOSIS — I120 Hypertensive chronic kidney disease with stage 5 chronic kidney disease or end stage renal disease: Secondary | ICD-10-CM | POA: Diagnosis not present

## 2017-01-18 DIAGNOSIS — M109 Gout, unspecified: Secondary | ICD-10-CM | POA: Diagnosis not present

## 2017-01-18 DIAGNOSIS — J3800 Paralysis of vocal cords and larynx, unspecified: Secondary | ICD-10-CM | POA: Diagnosis present

## 2017-01-18 DIAGNOSIS — Z7951 Long term (current) use of inhaled steroids: Secondary | ICD-10-CM

## 2017-01-18 DIAGNOSIS — R29898 Other symptoms and signs involving the musculoskeletal system: Secondary | ICD-10-CM | POA: Diagnosis not present

## 2017-01-18 DIAGNOSIS — C7931 Secondary malignant neoplasm of brain: Secondary | ICD-10-CM | POA: Diagnosis present

## 2017-01-18 DIAGNOSIS — J449 Chronic obstructive pulmonary disease, unspecified: Secondary | ICD-10-CM | POA: Diagnosis present

## 2017-01-18 DIAGNOSIS — E875 Hyperkalemia: Secondary | ICD-10-CM | POA: Diagnosis present

## 2017-01-18 DIAGNOSIS — F329 Major depressive disorder, single episode, unspecified: Secondary | ICD-10-CM | POA: Diagnosis present

## 2017-01-18 DIAGNOSIS — E785 Hyperlipidemia, unspecified: Secondary | ICD-10-CM | POA: Diagnosis present

## 2017-01-18 DIAGNOSIS — Z8249 Family history of ischemic heart disease and other diseases of the circulatory system: Secondary | ICD-10-CM | POA: Diagnosis not present

## 2017-01-18 DIAGNOSIS — Z85528 Personal history of other malignant neoplasm of kidney: Secondary | ICD-10-CM

## 2017-01-18 DIAGNOSIS — N2581 Secondary hyperparathyroidism of renal origin: Secondary | ICD-10-CM | POA: Diagnosis not present

## 2017-01-18 DIAGNOSIS — G40909 Epilepsy, unspecified, not intractable, without status epilepticus: Secondary | ICD-10-CM | POA: Diagnosis present

## 2017-01-18 DIAGNOSIS — R569 Unspecified convulsions: Secondary | ICD-10-CM

## 2017-01-18 DIAGNOSIS — E1151 Type 2 diabetes mellitus with diabetic peripheral angiopathy without gangrene: Secondary | ICD-10-CM | POA: Diagnosis not present

## 2017-01-18 DIAGNOSIS — M1000 Idiopathic gout, unspecified site: Secondary | ICD-10-CM | POA: Diagnosis not present

## 2017-01-18 DIAGNOSIS — Z905 Acquired absence of kidney: Secondary | ICD-10-CM

## 2017-01-18 DIAGNOSIS — Z9221 Personal history of antineoplastic chemotherapy: Secondary | ICD-10-CM | POA: Diagnosis not present

## 2017-01-18 DIAGNOSIS — Z992 Dependence on renal dialysis: Secondary | ICD-10-CM

## 2017-01-18 DIAGNOSIS — N186 End stage renal disease: Secondary | ICD-10-CM | POA: Diagnosis present

## 2017-01-18 DIAGNOSIS — I10 Essential (primary) hypertension: Secondary | ICD-10-CM | POA: Diagnosis not present

## 2017-01-18 DIAGNOSIS — Z87891 Personal history of nicotine dependence: Secondary | ICD-10-CM | POA: Diagnosis not present

## 2017-01-18 DIAGNOSIS — E039 Hypothyroidism, unspecified: Secondary | ICD-10-CM | POA: Diagnosis present

## 2017-01-18 DIAGNOSIS — E8889 Other specified metabolic disorders: Secondary | ICD-10-CM | POA: Diagnosis present

## 2017-01-18 DIAGNOSIS — F411 Generalized anxiety disorder: Secondary | ICD-10-CM | POA: Diagnosis present

## 2017-01-18 DIAGNOSIS — Z823 Family history of stroke: Secondary | ICD-10-CM | POA: Diagnosis not present

## 2017-01-18 DIAGNOSIS — K219 Gastro-esophageal reflux disease without esophagitis: Secondary | ICD-10-CM | POA: Diagnosis present

## 2017-01-18 DIAGNOSIS — D638 Anemia in other chronic diseases classified elsewhere: Secondary | ICD-10-CM | POA: Diagnosis present

## 2017-01-18 DIAGNOSIS — F32A Depression, unspecified: Secondary | ICD-10-CM | POA: Diagnosis present

## 2017-01-18 DIAGNOSIS — E1122 Type 2 diabetes mellitus with diabetic chronic kidney disease: Secondary | ICD-10-CM | POA: Diagnosis present

## 2017-01-18 DIAGNOSIS — R531 Weakness: Secondary | ICD-10-CM | POA: Diagnosis present

## 2017-01-18 LAB — COMPREHENSIVE METABOLIC PANEL
ALK PHOS: 139 U/L — AB (ref 38–126)
ALT: 22 U/L (ref 14–54)
AST: 22 U/L (ref 15–41)
Albumin: 3.4 g/dL — ABNORMAL LOW (ref 3.5–5.0)
Anion gap: 13 (ref 5–15)
BILIRUBIN TOTAL: 1.2 mg/dL (ref 0.3–1.2)
BUN: 43 mg/dL — AB (ref 6–20)
CALCIUM: 8.4 mg/dL — AB (ref 8.9–10.3)
CO2: 25 mmol/L (ref 22–32)
CREATININE: 6.86 mg/dL — AB (ref 0.44–1.00)
Chloride: 100 mmol/L — ABNORMAL LOW (ref 101–111)
GFR calc Af Amer: 6 mL/min — ABNORMAL LOW (ref >60)
GFR, EST NON AFRICAN AMERICAN: 5 mL/min — AB (ref >60)
Glucose, Bld: 119 mg/dL — ABNORMAL HIGH (ref 65–99)
Potassium: 5.2 mmol/L — ABNORMAL HIGH (ref 3.5–5.1)
Sodium: 138 mmol/L (ref 135–145)
TOTAL PROTEIN: 7.4 g/dL (ref 6.5–8.1)

## 2017-01-18 LAB — PROTIME-INR
INR: 1.1
Prothrombin Time: 14.2 seconds (ref 11.4–15.2)

## 2017-01-18 LAB — I-STAT CHEM 8, ED
BUN: 47 mg/dL — AB (ref 6–20)
CREATININE: 6.6 mg/dL — AB (ref 0.44–1.00)
Calcium, Ion: 1.06 mmol/L — ABNORMAL LOW (ref 1.15–1.40)
Chloride: 104 mmol/L (ref 101–111)
GLUCOSE: 120 mg/dL — AB (ref 65–99)
HCT: 42 % (ref 36.0–46.0)
HEMOGLOBIN: 14.3 g/dL (ref 12.0–15.0)
Potassium: 5.4 mmol/L — ABNORMAL HIGH (ref 3.5–5.1)
Sodium: 139 mmol/L (ref 135–145)
TCO2: 27 mmol/L (ref 0–100)

## 2017-01-18 LAB — CBC
HCT: 37.2 % (ref 36.0–46.0)
Hemoglobin: 11.3 g/dL — ABNORMAL LOW (ref 12.0–15.0)
MCH: 29.4 pg (ref 26.0–34.0)
MCHC: 30.4 g/dL (ref 30.0–36.0)
MCV: 96.9 fL (ref 78.0–100.0)
Platelets: 250 10*3/uL (ref 150–400)
RBC: 3.84 MIL/uL — ABNORMAL LOW (ref 3.87–5.11)
RDW: 19.3 % — ABNORMAL HIGH (ref 11.5–15.5)
WBC: 8.2 10*3/uL (ref 4.0–10.5)

## 2017-01-18 LAB — DIFFERENTIAL
BASOS ABS: 0 10*3/uL (ref 0.0–0.1)
Basophils Relative: 0 %
EOS ABS: 0.2 10*3/uL (ref 0.0–0.7)
Eosinophils Relative: 3 %
Lymphocytes Relative: 15 %
Lymphs Abs: 1.2 10*3/uL (ref 0.7–4.0)
MONOS PCT: 11 %
Monocytes Absolute: 0.9 10*3/uL (ref 0.1–1.0)
NEUTROS ABS: 5.9 10*3/uL (ref 1.7–7.7)
Neutrophils Relative %: 71 %

## 2017-01-18 LAB — APTT: APTT: 37 s — AB (ref 24–36)

## 2017-01-18 LAB — I-STAT TROPONIN, ED: TROPONIN I, POC: 0 ng/mL (ref 0.00–0.08)

## 2017-01-18 MED ORDER — SEVELAMER CARBONATE 800 MG PO TABS
3200.0000 mg | ORAL_TABLET | Freq: Three times a day (TID) | ORAL | Status: DC
  Administered 2017-01-19 (×3): 3200 mg via ORAL
  Filled 2017-01-18 (×3): qty 4

## 2017-01-18 MED ORDER — DEXAMETHASONE SODIUM PHOSPHATE 10 MG/ML IJ SOLN: 10.0000 mg | Freq: Four times a day (QID) | INTRAMUSCULAR | Status: DC

## 2017-01-18 MED ORDER — ALLOPURINOL 100 MG PO TABS
100.0000 mg | ORAL_TABLET | Freq: Every day | ORAL | Status: DC
  Administered 2017-01-19: 100 mg via ORAL
  Filled 2017-01-18: qty 1

## 2017-01-18 MED ORDER — SODIUM CHLORIDE 0.9% FLUSH
3.0000 mL | Freq: Two times a day (BID) | INTRAVENOUS | Status: DC
Start: 2017-01-18 — End: 2017-01-20
  Administered 2017-01-18 – 2017-01-19 (×2): 3 mL via INTRAVENOUS

## 2017-01-18 MED ORDER — CLONAZEPAM 0.5 MG PO TABS
0.5000 mg | ORAL_TABLET | Freq: Two times a day (BID) | ORAL | Status: DC | PRN
  Administered 2017-01-18 – 2017-01-19 (×3): 0.5 mg via ORAL
  Filled 2017-01-18 (×3): qty 1

## 2017-01-18 MED ORDER — MOMETASONE FURO-FORMOTEROL FUM 100-5 MCG/ACT IN AERO
2.0000 | INHALATION_SPRAY | Freq: Two times a day (BID) | RESPIRATORY_TRACT | Status: DC
  Administered 2017-01-18: 2 via RESPIRATORY_TRACT

## 2017-01-18 MED ORDER — HYDRALAZINE HCL 20 MG/ML IJ SOLN: 5.0000 mg | INTRAMUSCULAR | Status: DC | PRN

## 2017-01-18 MED ORDER — MOMETASONE FURO-FORMOTEROL FUM 200-5 MCG/ACT IN AERO
2.0000 | INHALATION_SPRAY | Freq: Two times a day (BID) | RESPIRATORY_TRACT | Status: DC
  Administered 2017-01-18 – 2017-01-19 (×3): 2 via RESPIRATORY_TRACT
  Filled 2017-01-18: qty 8.8

## 2017-01-18 MED ORDER — DOXERCALCIFEROL 4 MCG/2ML IV SOLN
2.0000 ug | INTRAVENOUS | Status: DC
  Filled 2017-01-18: qty 2

## 2017-01-18 MED ORDER — RENA-VITE PO TABS
1.0000 | ORAL_TABLET | Freq: Every day | ORAL | Status: DC
  Administered 2017-01-19: 1 via ORAL
  Filled 2017-01-18: qty 1

## 2017-01-18 MED ORDER — SODIUM POLYSTYRENE SULFONATE 15 GM/60ML PO SUSP: 30.0000 g | Freq: Once | ORAL | Status: DC

## 2017-01-18 MED ORDER — ATORVASTATIN CALCIUM 40 MG PO TABS
40.0000 mg | ORAL_TABLET | Freq: Every day | ORAL | Status: DC
  Administered 2017-01-19: 40 mg via ORAL
  Filled 2017-01-18: qty 1

## 2017-01-18 MED ORDER — LORATADINE 10 MG PO TABS
10.0000 mg | ORAL_TABLET | Freq: Every day | ORAL | Status: DC
  Administered 2017-01-19: 10 mg via ORAL
  Filled 2017-01-18: qty 1

## 2017-01-18 MED ORDER — ENSURE ENLIVE PO LIQD
237.0000 mL | Freq: Two times a day (BID) | ORAL | Status: DC
  Administered 2017-01-19: 237 mL via ORAL
  Filled 2017-01-18: qty 237

## 2017-01-18 MED ORDER — LACTULOSE 20 GM/30ML PO SOLN: 20.0000 g | Freq: Every day | ORAL | Status: DC | PRN

## 2017-01-18 MED ORDER — LACTULOSE 10 GM/15ML PO SOLN: 20.0000 g | Freq: Every day | ORAL | Status: DC | PRN

## 2017-01-18 MED ORDER — DM-GUAIFENESIN ER 30-600 MG PO TB12: 1.0000 | ORAL_TABLET | Freq: Two times a day (BID) | ORAL | Status: DC | PRN

## 2017-01-18 MED ORDER — HYDROXYZINE HCL 10 MG PO TABS
20.0000 mg | ORAL_TABLET | Freq: Every day | ORAL | Status: DC
  Administered 2017-01-18 – 2017-01-19 (×2): 20 mg via ORAL
  Filled 2017-01-18 (×2): qty 2

## 2017-01-18 MED ORDER — CINACALCET HCL 30 MG PO TABS
30.0000 mg | ORAL_TABLET | Freq: Every day | ORAL | Status: DC
  Administered 2017-01-19: 30 mg via ORAL
  Filled 2017-01-18: qty 1

## 2017-01-18 MED ORDER — DEXAMETHASONE SODIUM PHOSPHATE 10 MG/ML IJ SOLN
10.0000 mg | Freq: Once | INTRAMUSCULAR | Status: DC
  Filled 2017-01-18: qty 1

## 2017-01-18 MED ORDER — DEXAMETHASONE SODIUM PHOSPHATE 10 MG/ML IJ SOLN
10.0000 mg | Freq: Four times a day (QID) | INTRAMUSCULAR | Status: DC
  Administered 2017-01-18 – 2017-01-19 (×4): 10 mg via INTRAVENOUS
  Filled 2017-01-18 (×4): qty 1

## 2017-01-18 MED ORDER — VITAMIN D 1000 UNITS PO TABS
5000.0000 [IU] | ORAL_TABLET | Freq: Every day | ORAL | Status: DC
  Administered 2017-01-19: 5000 [IU] via ORAL
  Filled 2017-01-18: qty 5

## 2017-01-18 MED ORDER — DULOXETINE HCL 60 MG PO CPEP
60.0000 mg | ORAL_CAPSULE | Freq: Every day | ORAL | Status: DC
  Administered 2017-01-19: 60 mg via ORAL
  Filled 2017-01-18: qty 1

## 2017-01-18 MED ORDER — PANTOPRAZOLE SODIUM 40 MG PO TBEC
40.0000 mg | DELAYED_RELEASE_TABLET | Freq: Every day | ORAL | Status: DC
  Administered 2017-01-19: 40 mg via ORAL
  Filled 2017-01-18: qty 1

## 2017-01-18 MED ORDER — SEVELAMER CARBONATE 800 MG PO TABS: 2400.0000 mg | ORAL_TABLET | ORAL | Status: DC

## 2017-01-18 MED ORDER — ALBUTEROL SULFATE (2.5 MG/3ML) 0.083% IN NEBU: 3.0000 mL | INHALATION_SOLUTION | Freq: Four times a day (QID) | RESPIRATORY_TRACT | Status: DC | PRN

## 2017-01-18 MED ORDER — ACETAMINOPHEN 325 MG PO TABS: 650.0000 mg | ORAL_TABLET | Freq: Four times a day (QID) | ORAL | Status: DC | PRN

## 2017-01-18 MED ORDER — CYCLOBENZAPRINE HCL 10 MG PO TABS: 10.0000 mg | ORAL_TABLET | Freq: Three times a day (TID) | ORAL | Status: DC | PRN

## 2017-01-18 MED ORDER — PREGABALIN 25 MG PO CAPS
50.0000 mg | ORAL_CAPSULE | Freq: Every day | ORAL | Status: DC
  Administered 2017-01-18 – 2017-01-19 (×2): 50 mg via ORAL
  Filled 2017-01-18 (×2): qty 2

## 2017-01-18 MED ORDER — SEVELAMER CARBONATE 800 MG PO TABS: 2400.0000 mg | ORAL_TABLET | ORAL | Status: DC | PRN

## 2017-01-18 MED ORDER — TURMERIC 500 MG PO TABS: 500.0000 mg | ORAL_TABLET | Freq: Every day | ORAL | Status: DC

## 2017-01-18 MED ORDER — NUTRITIONAL SUPPLEMENT PO LIQD: Freq: Two times a day (BID) | ORAL | Status: DC

## 2017-01-18 MED ORDER — QUETIAPINE FUMARATE 50 MG PO TABS
100.0000 mg | ORAL_TABLET | Freq: Every day | ORAL | Status: DC
  Administered 2017-01-18: 100 mg via ORAL
  Filled 2017-01-18 (×2): qty 1

## 2017-01-18 MED ORDER — OXYCODONE HCL 5 MG PO TABS
5.0000 mg | ORAL_TABLET | Freq: Three times a day (TID) | ORAL | Status: DC | PRN
  Administered 2017-01-18 – 2017-01-19 (×3): 5 mg via ORAL
  Filled 2017-01-18 (×3): qty 1

## 2017-01-18 MED ORDER — ZOLPIDEM TARTRATE 5 MG PO TABS: 5.0000 mg | ORAL_TABLET | Freq: Every evening | ORAL | Status: DC | PRN

## 2017-01-18 MED ORDER — COLCHICINE 0.6 MG PO TABS: 0.3000 mg | ORAL_TABLET | ORAL | Status: DC

## 2017-01-18 MED ORDER — LEVOTHYROXINE SODIUM 75 MCG PO TABS
175.0000 ug | ORAL_TABLET | Freq: Every day | ORAL | Status: DC
  Administered 2017-01-19: 175 ug via ORAL
  Filled 2017-01-18 (×2): qty 1

## 2017-01-18 MED ORDER — MIDODRINE HCL 5 MG PO TABS
10.0000 mg | ORAL_TABLET | ORAL | Status: DC
  Administered 2017-01-19: 10 mg via ORAL
  Filled 2017-01-18 (×3): qty 2

## 2017-01-18 MED ORDER — MONTELUKAST SODIUM 10 MG PO TABS
10.0000 mg | ORAL_TABLET | Freq: Every day | ORAL | Status: DC
  Administered 2017-01-18 – 2017-01-19 (×2): 10 mg via ORAL
  Filled 2017-01-18 (×2): qty 1

## 2017-01-18 MED ORDER — POLYETHYLENE GLYCOL 3350 17 G PO PACK: 17.0000 g | PACK | Freq: Every day | ORAL | Status: DC | PRN

## 2017-01-18 MED ORDER — ONDANSETRON HCL 4 MG PO TABS: 8.0000 mg | ORAL_TABLET | Freq: Four times a day (QID) | ORAL | Status: DC | PRN

## 2017-01-18 NOTE — ED Notes (Signed)
IV team at bedside 
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
## 2017-01-18 NOTE — ED Provider Notes (Signed)
[facility] DEPT Provider Note   CSN: [id number] Arrival date & time: 01/18/17  1610     History   Chief Complaint Chief Complaint  Patient presents with  . Weakness    HPI [name] [name] is a 76 y.o. female.   Weakness  Primary symptoms include focal weakness. This is a new problem. The current episode started yesterday. The problem has been gradually worsening. There was right lower extremity focality noted. There has been no fever. Pertinent negatives include no shortness of breath, no vomiting and no altered mental status. Associated medical issues do not include dementia.    Past Medical History:  Diagnosis Date  . ANEMIA-NOS 05/29/2007  . ANXIETY 03/23/2010  . Arthritis    "all over my body"  . Cancer of kidney ([place]) 10/07/2012   Followed per Dr Despina Pole, MD, urology, [place]   . Chronic bronchitis ([place])   . Chronic lower back pain   . Chronic neck pain 12/13/2010  . 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 COMMON MIGRAINE    "stress common migraines"  . Complication of anesthesia    after goiter removed-one vocal cord paralyzed  . Critical lower limb ischemia   . DEPRESSION 02/17/2007  . ESRD (end stage renal disease) on dialysis ([place])    "TTS. [address]" (10/20/2015)  . ESRD on hemodialysis ([place]) 02/17/2007   Started dialysis April 2014.  Gets HD at [facility] on TTS schedule.  Cause of ESRD was HTN.     . GERD (gastroesophageal reflux disease)   . GOUT 05/29/2007  . Heart murmur 10/02/2010   hx  . HYPERLIPIDEMIA 02/17/2007  . HYPERTENSION 02/17/2007  . HYPOTHYROIDISM 02/17/2007   s/p surgical removal of goiter in 1997  . Memory loss 01/24/2010  . OSTEOPENIA 09/22/2009  . Palpitations 09/08/2010  . PEPTIC ULCER DISEASE 05/29/2007   "when I was in college"  . PERIPHERAL NEUROPATHY 05/29/2007  . PERIPHERAL VASCULAR DISEASE 02/17/2007  . Personal history of colonic polyps 11/16/2009  . Renal insufficiency   . RESTLESS LEG  SYNDROME 05/29/2007  . SEIZURE DISORDER 02/17/2007  . Type II diabetes mellitus ([place]) 02/17/2007   "haven't had it since ~ 2010" (10/20/2015)  . Vocal cord paralysis 1996    Patient Active Problem List   Diagnosis Date Noted  . Tibia/fibula fracture, left, closed, initial encounter 11/16/2016  . Closed displaced comminuted fracture of shaft of left tibia   . Impingement syndrome of left shoulder 10/23/2016  . Chronic pain of left thumb 10/23/2016  . Squamous cell lung cancer ([place]) 10/26/2015  . Lung mass 10/22/2015  . Cough 06/15/2015  . Syncope 05/25/2015    Class: Acute  . Thrombocytopenia ([place]) 08/04/2014  . Acute encephalopathy 08/03/2014  . Non-compliant behavior 05/20/2014  . Pain of right upper arm 04/30/2014  . Other complications due to renal dialysis device, implant, and graft 04/30/2014  . Hyperparathyroidism, secondary renal ([place]) 04/12/2014  . Hyperkalemia 03/30/2014  . Hypoglycemia 03/30/2014  . ESRD on hemodialysis ([place]) 03/30/2014  . Anemia of renal disease 03/30/2014  . Peripheral neuropathy 12/12/2013  . Hives 02/09/2013  . Thinning of skin 02/04/2013  . 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 Unspecified constipation 01/02/2013  . Muscle spasm 01/02/2013  . COPD (chronic obstructive pulmonary disease) ([place]) 01/02/2013  . Cancer of kidney ([place]) 10/07/2012  . Insomnia 10/07/2012  . Dysuria 01/24/2012  . N&V (nausea and vomiting) 01/24/2012  . Generalized weakness 08/17/2011  . Gait instability 08/17/2011  .  UTI (lower urinary tract infection) 08/17/2011  . Chronic pain of left knee 04/20/2011  . 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 Chronic neck pain 12/13/2010  . Osteopenia 12/13/2010  . Preventative health care 12/12/2010  . HEART MURMUR, HX OF 10/02/2010  . Palpitations 09/08/2010  . HYPERSOMNIA 05/26/2010  . Anxiety state 03/23/2010  . Memory loss 01/24/2010  . Backache 12/12/2009  . Personal history of colonic polyps 11/16/2009  . MENOPAUSAL DISORDER  05/25/2009  . SHOULDER PAIN, LEFT, CHRONIC 09/15/2008  . CERVICAL RADICULOPATHY, LEFT 05/27/2008  . Osteoarth NOS-L/Leg 03/11/2008  . 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 FATIGUE 09/17/2007  . Abnormality of gait 07/30/2007  . Gout 05/29/2007  . RESTLESS LEG SYNDROME 05/29/2007  . COMMON MIGRAINE 05/29/2007  . PERIPHERAL NEUROPATHY 05/29/2007  . Asthmatic bronchitis , chronic (HCC) 05/29/2007  . Peptic ulcer 05/29/2007  . Hypothyroidism 02/17/2007  . Impaired glucose tolerance 02/17/2007  . HLD (hyperlipidemia) 02/17/2007  . DEPRESSION 02/17/2007  . Essential hypertension 02/17/2007  . Peripheral vascular disease (HCC) 02/17/2007  . ALLERGIC RHINITIS 02/17/2007  . GERD 02/17/2007  . Seizure (HCC) 02/17/2007    Past Surgical History:  Procedure Laterality Date  . A/V SHUNTOGRAM N/A 10/22/2016   Procedure: A/V Fistulagram - Right Arm;  Surgeon: Chuck Hint, MD;  Location[facility] INVASIVE CV LAB;  Service: Cardiovascular;  Laterality: N/A;  . AV FISTULA PLACEMENT  03/13/2012   Procedure: ARTERIOVENOUS (AV) FISTULA CREATION;  Surgeon: Fransisco Hertz, MD;  Location: [facility] OR;  Service: Vascular;  Laterality: Right;  . BUNIONECTOMY Bilateral 1980  . CATARACT EXTRACTION, BILATERAL Bilateral    bilateral cataract removal  . ELECTROCARDIOGRAM  05/29/2006  . ESOPHAGOSCOPY W/ BOTOX INJECTION  07/22/2012   Procedure: ESOPHAGOSCOPY WITH BOTOX INJECTION;  Surgeon: Drema Halon, MD;  Location: [facility];  Service: ENT;  Laterality: N/A;  esophageal dilation  . INSERTION OF DIALYSIS CATHETER N/A 02/05/2013   Procedure: INSERTION OF DIALYSIS CATHETER;  Surgeon: Chuck Hint, MD;  Location: [facility] OR;  Service: Vascular;  Laterality: N/A;  Ultrasound guided  . LAPAROSCOPIC CHOLECYSTECTOMY    . PERIPHERAL VASCULAR BALLOON ANGIOPLASTY Right 10/22/2016   Procedure: Peripheral Vascular Balloon Angioplasty;  Surgeon: Chuck Hint, MD;  Location: [facility] INVASIVE CV LAB;   Service: Cardiovascular;  Laterality: Right;  AV fistula  . SHOULDER OPEN ROTATOR CUFF REPAIR Left    Dr. Lajoyce Corners  . stress Cardiolite  06/18/2006  . THYROID SURGERY  1997   goiter removal  . TIBIA IM NAIL INSERTION Left 11/18/2016   Procedure: INTRAMEDULLARY (IM) NAIL TIBIAL;  Surgeon: Kathryne Hitch, MD;  Location: [facility] OR;  Service: Orthopedics;  Laterality: Left;  . TOE AMPUTATION Left 2006  . TOE AMPUTATION Right Aug. 2015   2nd   . tranthoracic echocardiogram  06/18/2006  . VIDEO BRONCHOSCOPY Bilateral 10/25/2015   Procedure: VIDEO BRONCHOSCOPY WITH FLUORO;  Surgeon: Oretha Milch, MD;  Location: [facility] ENDOSCOPY;  Service: Cardiopulmonary;  Laterality: Bilateral;    OB History    No data available       Home Medications    Prior to Admission medications   Medication Sig Start Date End Date Taking? Authorizing Provider  acetaminophen (TYLENOL) 500 MG tablet Take 1,000 mg by mouth 3 (three) times daily.    [provider]  albuterol (PROVENTIL HFA;VENTOLIN HFA) 108 (90 BASE) MCG/ACT inhaler Inhale 2 puffs into the lungs every 6 (six) hours as needed for wheezing or shortness of breath. 06/15/15   Corwin Levins,  MD  allopurinol (ZYLOPRIM) 100 MG tablet TAKE ONE TABLET BY MOUTH ONCE DAILY 05/11/16   Biagio Borg, MD  Amino Acids-Protein Hydrolys (FEEDING SUPPLEMENT, PRO-STAT SUGAR FREE 64,) LIQD Take 30 mLs by mouth daily.    [provider]  atorvastatin (LIPITOR) 40 MG tablet TAKE ONE TABLET BY MOUTH ONCE DAILY 04/01/15   Biagio Borg, MD  budesonide-formoterol [facility]) 160-4.5 MCG/ACT inhaler Inhale 2 puffs into the lungs 2 (two) times daily. Wait 1 min in between puffs, rinse mouth after use    [provider]  cetirizine (ZYRTEC) 10 MG tablet Take 10 mg by mouth daily.    [provider]  Cholecalciferol (VITAMIN D-3) 5000 units TABS Take 1 tablet by mouth daily.    [provider]  cinacalcet (SENSIPAR) 30 MG tablet Take 30 mg by  mouth daily with supper.    [provider]  clonazePAM (KLONOPIN) 0.5 MG tablet Take 0.5 mg by mouth 2 (two) times daily as needed for anxiety.    [provider]  colchicine 0.6 MG tablet Take 0.5 tablets (0.3 mg total) by mouth 2 (two) times a week. 10/28/15   Dixie Dials, MD  cyclobenzaprine (FLEXERIL) 10 MG tablet TAKE ONE TABLET BY MOUTH THREE TIMES DAILY AS NEEDED FOR MUSCLE SPASM 03/09/16   Biagio Borg, MD  darbepoetin [facility]) 25 MCG/0.42ML SOLN injection Inject 25 mcg into the vein every 7 (seven) days. Every Thursday at dialysis    [provider]  dextromethorphan-guaiFENesin [facility] DM) 30-600 MG 12hr tablet Take 1 tablet by mouth 2 (two) times daily as needed for cough.     [provider]  doxercalciferol (HECTOROL) 4 MCG/2ML injection Inject 1 mL (2 mcg total) into the vein Every Tuesday,Thursday,and Saturday with dialysis. 10/28/15   Dixie Dials, MD  DULoxetine (CYMBALTA) 30 MG capsule Take 2 capsules (60 mg total) by mouth daily. 05/01/16   Biagio Borg, MD  hydrOXYzine (ATARAX/VISTARIL) 10 MG tablet Take 20 mg by mouth at bedtime.     [provider]  Lactulose 20 GM/30ML SOLN Take 30 mLs by mouth daily as needed.     [provider]  levothyroxine (SYNTHROID, LEVOTHROID) 175 MCG tablet Take 1 tablet (175 mcg total) by mouth daily before breakfast. 03/02/15   Biagio Borg, MD  mometasone-formoterol (DULERA) 100-5 MCG/ACT AERO Inhale 2 puffs into the lungs 2 (two) times daily.    [provider]  montelukast (SINGULAIR) 10 MG tablet TAKE ONE TABLET BY MOUTH AT BEDTIME. SCHEDULE APPOINTMENT FOR YEARLY PHYSICAL WITH LABS. MUST SEE DOCTOR FOR REFILLS 08/21/16   Biagio Borg, MD  multivitamin (RENA-VIT) TABS tablet Take 1 tablet by mouth daily.    [provider]  omeprazole (PRILOSEC) 40 MG capsule Take 1 capsule (40 mg total) by mouth daily. 01/13/16   Biagio Borg, MD  ondansetron (ZOFRAN) 8 MG tablet Take 1  tablet (8 mg total) by mouth every 6 (six) hours as needed for nausea or vomiting. 01/13/16   Biagio Borg, MD  OVER THE COUNTER MEDICATION Take 1 capsule by mouth daily. Beet Root 1000 mg per cap     [provider]  OVER THE COUNTER MEDICATION 8 drops by Other route daily. Places 8 drops in 1 bottle of water  "Cell Food"    [provider]  oxyCODONE (OXY IR/ROXICODONE) 5 MG immediate release tablet Take 1 tablet (5 mg total) by mouth every 8 (eight) hours as needed. 12/14/16  Suzan Slick, NP  polyethylene glycol (MIRALAX / GLYCOLAX) packet Take 17 g by mouth daily as needed for mild constipation. 11/24/16   Oswald Hillock, MD  pregabalin (LYRICA) 50 MG capsule Take 50 mg by mouth at bedtime.     [provider]  QUEtiapine (SEROQUEL) 100 MG tablet Take 1 tablet (100 mg total) by mouth at bedtime. 05/01/16   Biagio Borg, MD  sevelamer carbonate (RENVELA) 800 MG tablet Take 2,400-3,200 mg by mouth See admin instructions. 4 tabs with meals, 3 tabs with snacks    [provider]  Turmeric Curcumin 500 MG CAPS Take 500 mg by mouth daily.    [provider]  UNABLE TO FIND Med Name: Med pass 120 mL by mouth 2 times daily    [provider]    Family History Family History  Problem Relation Age of Onset  . Dementia Mother   . Hypertension Mother   . Coronary artery disease Other   . Hyperlipidemia Other   . Hypertension Other   . Ovarian cancer Other   . Stroke Other   . Hypertension Sister   . Hypertension Brother   . Heart attack Brother   . Stroke Brother     Social History Social History  Substance Use Topics  . Smoking status: Former Smoker    Packs/day: 0.25    Years: 51.00    Types: Cigarettes  . Smokeless tobacco: Never Used     Comment: USING E CIG  . Alcohol use No     Allergies   Cephalosporins; Nsaids; Pioglitazone; Adhesive [tape]; and Vancomycin   Review of Systems Review of Systems  Respiratory: Negative  for shortness of breath.   Gastrointestinal: Negative for vomiting.  Neurological: Positive for focal weakness and weakness.  All other systems reviewed and are negative.    Physical Exam Updated Vital Signs BP (!) 151/74   Pulse 83   Temp 98.7 F (37.1 C) (Oral)   Resp (!) 22   SpO2 100%   Physical Exam  Constitutional: She is oriented to person, place, and time. She appears well-developed and well-nourished.  HENT:  Head: Normocephalic and atraumatic.  Eyes: Conjunctivae and EOM are normal.  Neck: Normal range of motion.  Cardiovascular: Normal rate and regular rhythm.   Pulmonary/Chest: No stridor. No respiratory distress.  Abdominal: Soft. She exhibits no distension.  Neurological: She is alert and oriented to person, place, and time. No cranial nerve deficit. Coordination normal.  significantly decreased strength in RLE (3/5 in dorsiflexion, plantar flexion and hip flexion) 4/5 strength in RUE CN's normal  Skin: Skin is warm and dry. No pallor.  Nursing note and vitals reviewed.    ED Treatments / Results  Labs (all labs ordered are listed, but only abnormal results are displayed) Labs Reviewed  APTT - Abnormal; Notable for the following:       Result Value   aPTT 37 (*)    All other components within normal limits  CBC - Abnormal; Notable for the following:    RBC 3.84 (*)    Hemoglobin 11.3 (*)    RDW 19.3 (*)    All other components within normal limits  COMPREHENSIVE METABOLIC PANEL - Abnormal; Notable for the following:    Potassium 5.2 (*)    Chloride 100 (*)    Glucose, Bld 119 (*)    BUN 43 (*)    Creatinine, Ser 6.86 (*)    Calcium 8.4 (*)    Albumin  3.4 (*)    Alkaline Phosphatase 139 (*)    GFR calc non Af Amer 5 (*)    GFR calc Af Amer 6 (*)    All other components within normal limits  I-STAT CHEM 8, ED - Abnormal; Notable for the following:    Potassium 5.4 (*)    BUN 47 (*)    Creatinine, Ser 6.60 (*)    Glucose, Bld 120 (*)     Calcium, Ion 1.06 (*)    All other components within normal limits  PROTIME-INR  DIFFERENTIAL  I-STAT TROPOININ, ED  CBG MONITORING, ED    EKG  EKG Interpretation None       Radiology Ct Head Wo Contrast  Result Date: 01/18/2017 CLINICAL DATA:  Weakness. EXAM: CT HEAD WITHOUT CONTRAST TECHNIQUE: Contiguous axial images were obtained from the base of the skull through the vertex without intravenous contrast. COMPARISON:  Brain MRI 11/18/2015 FINDINGS: Brain: 2 cm mass in the peripheral left cerebellum with mild neighboring edema. The mass is mildly hyperdense. High posterior left frontal 2 cm mass that is high density. Mild surrounding edema. No infarct, hydrocephalus, or shift. There is a background of generalized atrophy. Mild chronic white matter disease. Vascular: Atherosclerotic calcification. Skull: Sclerotic calvarium, likely from end-stage renal disease status. No focal lesion noted. Sinuses/Orbits: Negative Other: These results were called by telephone at the time of interpretation on 01/18/2017 at 6:21 pm to Dr. Dayna Barker , who verbally acknowledged these results. IMPRESSION: 2 cm left frontal mass along the upper motor strip. 2 cm left cerebellar mass. Findings are consistent with metastatic disease in this patient with history of lung cancer. The masses have a hemorrhagic appearance with mild edema. No herniation or shift. Electronically Signed   By: Monte Fantasia M.D.   On: 01/18/2017 18:26    Procedures Procedures (including critical care time)  Medications Ordered in ED Medications - No data to display   Initial Impression / Assessment and Plan / ED Course  I have reviewed the triage vital signs and the nursing notes.  Pertinent labs & imaging results that were available during my care of the patient were reviewed by me and considered in my medical decision making (see chart for details).    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 H/o scc lung ca in past. Will admit  for workup.  Oncologist is Dr. Mindi Junker at [facility], discussed with Dr. Kathreen Devoid on call for oncology who accepted for admission, awaiting bed. Recommended decadron 10 mg q6h X 24 hours while waiting transfer.   Final Clinical Impressions(s) / ED Diagnoses   Final diagnoses:  Weakness of right lower extremity      [name] [name], Corene Cornea, MD 01/18/17 1956

## 2017-01-18 NOTE — Progress Notes (Signed)
PHARMACIST - PHYSICIAN ORDER COMMUNICATION  CONCERNING: P&T Medication Policy on Herbal Medications  DESCRIPTION:  This patient's order for:  Tumeric  has been noted.  This product(s) is classified as an "herbal" or natural product. Due to a lack of definitive safety studies or FDA approval, nonstandard manufacturing practices, plus the potential risk of unknown drug-drug interactions while on inpatient medications, the Pharmacy and Therapeutics Committee does not permit the use of "herbal" or natural products of this type within [facility].   ACTION TAKEN: The pharmacy department is unable to verify this order at this time and your patient has been informed of this safety policy. Please reevaluate patient's clinical condition at discharge and address if the herbal or natural product(s) should be resumed at that time.  Sherlon Handing, PharmD, BCPS Clinical pharmacist, pager [phone] 01/18/2017 9:58 PM

## 2017-01-18 NOTE — H&P (Signed)
History and Physical    [name] [name] ZOX:[id number] DOB: 1941/06/19 DOA: 01/18/2017  Referring MD/NP/PA:   PCP: Biagio Borg, MD   Patient coming from:  The patient is coming from rehab facility.  At baseline, pt is dependent for most of ADL.    Chief Complaint: right leg weaknss  HPI: [name] [name] is a 76 y.o. female with medical history significant of hypertension, hyperlipidemia, diet-controlled diabetes, GERD, hypothyroidism, gout, depression, anxiety, vocal cord paralysis, seizure, RLS, DVT, PUD, ESRD on HD (T/T/S), carcinoma of kidney, anemia, lung cancer (s/p of chemotherapy), who presents with right leg weakness.  Patient states that her symptoms started since yesterday. She has left leg weakness, but no tingling sensations. Her arms are normal. Patient does not have vision change, hearing loss. No slurred speech or facial droop. She denies chest pain, shortness breath, nausea, vomiting, diarrhea, abdominal pain, symptoms of UTI. She states that she has been compliant to hemodialysis. Last dialysis was on Thursday.  ED Course: pt was found to have WBC 8.2, INR 1.10, PTT 37, troponin negative, potassium 5.2, creatinine 6.86, bicarbonate 25, BUN 43, temperature normal, no tachycardia, oxygen saturation 100% on room air. Patient is admitted to telemetry bed as inpatient. Pt's oncologist is Dr. Mindi Junker at [facility]. EDP discussed with Dr. Kathreen Devoid on call for oncology who accepted for admission, but no bed available now, he recommended decadron 10 mg q6h X 24 hours while waiting transfer.   # CT-head showed: 2 cm left frontal mass along the upper motor strip. 2 cm left cerebellar mass. Findings are consistent with metastatic disease in this patient with history of lung cancer. The masses have a hemorrhagic appearance with mild edema. No herniation or shift.   Review of Systems:   General: no fevers, chills, no changes in body weight HEENT: no blurry vision, hearing changes or sore  throat Respiratory: no dyspnea, coughing, wheezing CV: no chest pain, no palpitations GI: no nausea, vomiting, abdominal pain, diarrhea, constipation GU: no dysuria, burning on urination, increased urinary frequency, hematuria  Ext: no leg edema Neuro: has right leg weakness. No vision change or hearing loss Skin: no rash, no skin tear. MSK: No muscle spasm, no deformity, no limitation of range of movement in spin Heme: No easy bruising.  Travel history: No recent long distant travel.  Allergy:  Allergies  Allergen Reactions  . Cephalosporins Itching and Rash    PATIENT DENIES THIS REACTION - Vanc and fortaz given at the same time in June for several doses at dialysis with systemic rash and itching; received zinacef 7/5 and had worseningsystemicrash/ itching and swelling of eyes - so unclear if allergic to either or both  . Nsaids Other (See Comments)    Renal dysfunction  . Pioglitazone Swelling    PATIENT DENIES THIS REACTON - edema  . Adhesive [Tape] Rash  . Vancomycin Rash    PATIENT DENIES THIS REACTON - See comment under cephalosporin    Past Medical History:  Diagnosis Date  . ANEMIA-NOS 05/29/2007  . ANXIETY 03/23/2010  . Arthritis    "all over my body"  . Cancer of kidney ([place]) 10/07/2012   Followed per Dr Despina Pole, MD, urology, [facility]   . Chronic bronchitis ([place])   . Chronic lower back pain   . Chronic neck pain 12/13/2010  . 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 COMMON MIGRAINE    "stress common migraines"  . Complication of anesthesia    after  goiter removed-one vocal cord paralyzed  . Critical lower limb ischemia   . DEPRESSION 02/17/2007  . ESRD (end stage renal disease) on dialysis ([place])    "TTS. [address]" (10/20/2015)  . ESRD on hemodialysis ([place]) 02/17/2007   Started dialysis April 2014.  Gets HD at [facility] on TTS schedule.  Cause of ESRD was HTN.     . GERD (gastroesophageal reflux disease)   . GOUT 05/29/2007   . Heart murmur 10/02/2010   hx  . HYPERLIPIDEMIA 02/17/2007  . HYPERTENSION 02/17/2007  . HYPOTHYROIDISM 02/17/2007   s/p surgical removal of goiter in 1997  . Memory loss 01/24/2010  . OSTEOPENIA 09/22/2009  . Palpitations 09/08/2010  . PEPTIC ULCER DISEASE 05/29/2007   "when I was in college"  . PERIPHERAL NEUROPATHY 05/29/2007  . PERIPHERAL VASCULAR DISEASE 02/17/2007  . Personal history of colonic polyps 11/16/2009  . Renal insufficiency   . RESTLESS LEG SYNDROME 05/29/2007  . SEIZURE DISORDER 02/17/2007  . Type II diabetes mellitus ([place]) 02/17/2007   "haven't had it since ~ 2010" (10/20/2015)  . Vocal cord paralysis 1996    Past Surgical History:  Procedure Laterality Date  . A/V SHUNTOGRAM N/A 10/22/2016   Procedure: A/V Fistulagram - Right Arm;  Surgeon: Angelia Mould, MD;  Location: [facility];  Service: Cardiovascular;  Laterality: N/A;  . AV FISTULA PLACEMENT  03/13/2012   Procedure: ARTERIOVENOUS (AV) FISTULA CREATION;  Surgeon: Conrad [place], MD;  Location: [facility];  Service: Vascular;  Laterality: Right;  . BUNIONECTOMY Bilateral 1980  . CATARACT EXTRACTION, BILATERAL Bilateral    bilateral cataract removal  . ELECTROCARDIOGRAM  05/29/2006  . ESOPHAGOSCOPY W/ BOTOX INJECTION  07/22/2012   Procedure: ESOPHAGOSCOPY WITH BOTOX INJECTION;  Surgeon: Rozetta Nunnery, MD;  Location: [facility];  Service: ENT;  Laterality: N/A;  esophageal dilation  . INSERTION OF DIALYSIS CATHETER N/A 02/05/2013   Procedure: INSERTION OF DIALYSIS CATHETER;  Surgeon: Angelia Mould, MD;  Location: [facility];  Service: Vascular;  Laterality: N/A;  Ultrasound guided  . LAPAROSCOPIC CHOLECYSTECTOMY    . PERIPHERAL VASCULAR BALLOON ANGIOPLASTY Right 10/22/2016   Procedure: Peripheral Vascular Balloon Angioplasty;  Surgeon: Angelia Mould, MD;  Location: [facility];  Service: Cardiovascular;  Laterality: Right;  AV fistula  . SHOULDER OPEN ROTATOR CUFF REPAIR  Left    Dr. Sharol Given  . stress Cardiolite  06/18/2006  . THYROID SURGERY  1997   goiter removal  . TIBIA IM NAIL INSERTION Left 11/18/2016   Procedure: INTRAMEDULLARY (IM) NAIL TIBIAL;  Surgeon: Mcarthur Rossetti, MD;  Location: [facility];  Service: Orthopedics;  Laterality: Left;  . TOE AMPUTATION Left 2006  . TOE AMPUTATION Right Aug. 2015   2nd   . tranthoracic echocardiogram  06/18/2006  . VIDEO BRONCHOSCOPY Bilateral 10/25/2015   Procedure: VIDEO BRONCHOSCOPY WITH FLUORO;  Surgeon: Rigoberto Noel, MD;  Location: [facility];  Service: Cardiopulmonary;  Laterality: Bilateral;    Social History:  reports that she has quit smoking. Her smoking use included Cigarettes. She has a 12.75 pack-year smoking history. She has never used smokeless tobacco. She reports that she does not drink alcohol or use drugs.  Family History:  Family History  Problem Relation Age of Onset  . Dementia Mother   . Hypertension Mother   . Coronary artery disease Other   . Hyperlipidemia Other   . Hypertension Other   . Ovarian cancer Other   . Stroke Other   .  Hypertension Sister   . Hypertension Brother   . Heart attack Brother   . Stroke Brother      Prior to Admission medications   Medication Sig Start Date End Date Taking? Authorizing Provider  acetaminophen (TYLENOL) 500 MG tablet Take 1,000 mg by mouth 3 (three) times daily.    [provider]  albuterol (PROVENTIL HFA;VENTOLIN HFA) 108 (90 BASE) MCG/ACT inhaler Inhale 2 puffs into the lungs every 6 (six) hours as needed for wheezing or shortness of breath. 06/15/15   Biagio Borg, MD  allopurinol (ZYLOPRIM) 100 MG tablet TAKE ONE TABLET BY MOUTH ONCE DAILY 05/11/16   Biagio Borg, MD  Amino Acids-Protein Hydrolys (FEEDING SUPPLEMENT, PRO-STAT SUGAR FREE 64,) LIQD Take 30 mLs by mouth daily.    [provider]  atorvastatin (LIPITOR) 40 MG tablet TAKE ONE TABLET BY MOUTH ONCE DAILY 04/01/15   Biagio Borg, MD  budesonide-formoterol  [facility]) 160-4.5 MCG/ACT inhaler Inhale 2 puffs into the lungs 2 (two) times daily. Wait 1 min in between puffs, rinse mouth after use    [provider]  cetirizine (ZYRTEC) 10 MG tablet Take 10 mg by mouth daily.    [provider]  Cholecalciferol (VITAMIN D-3) 5000 units TABS Take 1 tablet by mouth daily.    [provider]  cinacalcet (SENSIPAR) 30 MG tablet Take 30 mg by mouth daily with supper.    [provider]  clonazePAM (KLONOPIN) 0.5 MG tablet Take 0.5 mg by mouth 2 (two) times daily as needed for anxiety.    [provider]  colchicine 0.6 MG tablet Take 0.5 tablets (0.3 mg total) by mouth 2 (two) times a week. 10/28/15   Dixie Dials, MD  cyclobenzaprine (FLEXERIL) 10 MG tablet TAKE ONE TABLET BY MOUTH THREE TIMES DAILY AS NEEDED FOR MUSCLE SPASM 03/09/16   Biagio Borg, MD  darbepoetin [facility]) 25 MCG/0.42ML SOLN injection Inject 25 mcg into the vein every 7 (seven) days. Every Thursday at dialysis    [provider]  dextromethorphan-guaiFENesin [facility] DM) 30-600 MG 12hr tablet Take 1 tablet by mouth 2 (two) times daily as needed for cough.     [provider]  doxercalciferol (HECTOROL) 4 MCG/2ML injection Inject 1 mL (2 mcg total) into the vein Every Tuesday,Thursday,and Saturday with dialysis. 10/28/15   Dixie Dials, MD  DULoxetine (CYMBALTA) 30 MG capsule Take 2 capsules (60 mg total) by mouth daily. 05/01/16   Biagio Borg, MD  hydrOXYzine (ATARAX/VISTARIL) 10 MG tablet Take 20 mg by mouth at bedtime.     [provider]  Lactulose 20 GM/30ML SOLN Take 30 mLs by mouth daily as needed.     [provider]  levothyroxine (SYNTHROID, LEVOTHROID) 175 MCG tablet Take 1 tablet (175 mcg total) by mouth daily before breakfast. 03/02/15   Biagio Borg, MD  mometasone-formoterol (DULERA) 100-5 MCG/ACT AERO Inhale 2 puffs into the lungs 2 (two) times daily.    [provider]  montelukast  (SINGULAIR) 10 MG tablet TAKE ONE TABLET BY MOUTH AT BEDTIME. SCHEDULE APPOINTMENT FOR YEARLY PHYSICAL WITH LABS. MUST SEE DOCTOR FOR REFILLS 08/21/16   Biagio Borg, MD  multivitamin (RENA-VIT) TABS tablet Take 1 tablet by mouth daily.    [provider]  omeprazole (PRILOSEC) 40 MG capsule Take 1 capsule (40 mg total) by mouth daily. 01/13/16   Biagio Borg, MD  ondansetron (ZOFRAN) 8 MG tablet Take 1 tablet (8 mg total) by mouth every 6 (  six) hours as needed for nausea or vomiting. 01/13/16   Biagio Borg, MD  OVER THE COUNTER MEDICATION Take 1 capsule by mouth daily. Beet Root 1000 mg per cap     [provider]  OVER THE COUNTER MEDICATION 8 drops by Other route daily. Places 8 drops in 1 bottle of water  "Cell Food"    [provider]  oxyCODONE (OXY IR/ROXICODONE) 5 MG immediate release tablet Take 1 tablet (5 mg total) by mouth every 8 (eight) hours as needed. 12/14/16   Suzan Slick, NP  polyethylene glycol (MIRALAX / GLYCOLAX) packet Take 17 g by mouth daily as needed for mild constipation. 11/24/16   Oswald Hillock, MD  pregabalin (LYRICA) 50 MG capsule Take 50 mg by mouth at bedtime.     [provider]  QUEtiapine (SEROQUEL) 100 MG tablet Take 1 tablet (100 mg total) by mouth at bedtime. 05/01/16   Biagio Borg, MD  sevelamer carbonate (RENVELA) 800 MG tablet Take 2,400-3,200 mg by mouth See admin instructions. 4 tabs with meals, 3 tabs with snacks    [provider]  Turmeric Curcumin 500 MG CAPS Take 500 mg by mouth daily.    [provider]  UNABLE TO FIND Med Name: Med pass 120 mL by mouth 2 times daily    [provider]    Physical Exam: Vitals:   01/18/17 1915 01/18/17 1925 01/18/17 2207 01/19/17 0651  BP:  (!) 158/83 (!) 160/75 (!) 137/58  Pulse: 90 91 87 87  Resp: 15 12 18 16   Temp:   98.1 F (36.7 C) 97.6 F (36.4 C)  TempSrc:   Oral Oral  SpO2: 100% 97% 98% 100%  Weight:   66.7 kg (147 lb)   Height:   5'  7.5" (1.715 m)    General: Not in acute distress HEENT:       Eyes: PERRL, EOMI, no scleral icterus.       ENT: No discharge from the ears and nose, no pharynx injection, no tonsillar enlargement.        Neck: No JVD, no bruit, no mass felt. Heme: No neck lymph node enlargement. Cardiac: S1/S2, RRR, No murmurs, No gallops or rubs. Respiratory: No rales, wheezing, rhonchi or rubs. GI: Soft, nondistended, nontender, no rebound pain, no organomegaly, BS present. GU: No hematuria Ext: No pitting leg edema bilaterally. 2+DP/PT pulse bilaterally. Has functioning AVF in right arm Musculoskeletal: No joint deformities, No joint redness or warmth, no limitation of ROM in spin. Skin: No rashes.  Neuro: Alert, oriented X3, cranial nerves II-XII grossly intact, moves all extremities normally. Muscle strength 2/5 in in right leg and 5/5 in other extremities, sensation to light touch intact. Brachial reflex 2+ bilaterally. Negative Babinski's sign.  Psych: Patient is not psychotic, no suicidal or hemocidal ideation.  Labs on Admission: I have personally reviewed following labs and imaging studies  CBC:  Recent Labs Lab 01/18/17 1636 01/18/17 1652  WBC 8.2  --   NEUTROABS 5.9  --   HGB 11.3* 14.3  HCT 37.2 42.0  MCV 96.9  --   PLT 250  --    Basic Metabolic Panel:  Recent Labs Lab 01/18/17 1636 01/18/17 1652  NA 138 139  K 5.2* 5.4*  CL 100* 104  CO2 25  --   GLUCOSE 119* 120*  BUN 43* 47*  CREATININE 6.86* 6.60*  CALCIUM 8.4*  --    GFR: Estimated Creatinine Clearance: 7.1 mL/min (A) (by  C-G formula based on SCr of 6.6 mg/dL (H)). Liver Function Tests:  Recent Labs Lab 01/18/17 1636  AST 22  ALT 22  ALKPHOS 139*  BILITOT 1.2  PROT 7.4  ALBUMIN 3.4*   No results for input(s): LIPASE, AMYLASE in the last 168 hours. No results for input(s): AMMONIA in the last 168 hours. Coagulation Profile:  Recent Labs Lab 01/18/17 1636 01/19/17 0422  INR 1.10 1.11   Cardiac  Enzymes: No results for input(s): CKTOTAL, CKMB, CKMBINDEX, TROPONINI in the last 168 hours. BNP (last 3 results) No results for input(s): PROBNP in the last 8760 hours. HbA1C: No results for input(s): HGBA1C in the last 72 hours. CBG: No results for input(s): GLUCAP in the last 168 hours. Lipid Profile: No results for input(s): CHOL, HDL, LDLCALC, TRIG, CHOLHDL, LDLDIRECT in the last 72 hours. Thyroid Function Tests: No results for input(s): TSH, T4TOTAL, FREET4, T3FREE, THYROIDAB in the last 72 hours. Anemia Panel: No results for input(s): VITAMINB12, FOLATE, FERRITIN, TIBC, IRON, RETICCTPCT in the last 72 hours. Urine analysis:    Component Value Date/Time   COLORURINE BROWN (A) 08/04/2014 0254   APPEARANCEUR TURBID (A) 08/04/2014 0254   LABSPEC 1.017 08/04/2014 0254   PHURINE 6.0 08/04/2014 0254   GLUCOSEU NEGATIVE 08/04/2014 0254   GLUCOSEU NEGATIVE 10/07/2012 1421   HGBUR LARGE (A) 08/04/2014 0254   BILIRUBINUR SMALL (A) 08/04/2014 0254   KETONESUR 15 (A) 08/04/2014 0254   PROTEINUR >300 (A) 08/04/2014 0254   UROBILINOGEN 0.2 08/04/2014 0254   NITRITE NEGATIVE 08/04/2014 0254   LEUKOCYTESUR LARGE (A) 08/04/2014 0254   Sepsis Labs: @LABRCNTIP (procalcitonin:4,lacticidven:4) )No results found for this or any previous visit (from the past 240 hour(s)).   Radiological Exams on Admission: Ct Head Wo Contrast  Result Date: 01/18/2017 CLINICAL DATA:  Weakness. EXAM: CT HEAD WITHOUT CONTRAST TECHNIQUE: Contiguous axial images were obtained from the base of the skull through the vertex without intravenous contrast. COMPARISON:  Brain MRI 11/18/2015 FINDINGS: Brain: 2 cm mass in the peripheral left cerebellum with mild neighboring edema. The mass is mildly hyperdense. High posterior left frontal 2 cm mass that is high density. Mild surrounding edema. No infarct, hydrocephalus, or shift. There is a background of generalized atrophy. Mild chronic white matter disease. Vascular:  Atherosclerotic calcification. Skull: Sclerotic calvarium, likely from end-stage renal disease status. No focal lesion noted. Sinuses/Orbits: Negative Other: These results were called by telephone at the time of interpretation on 01/18/2017 at 6:21 pm to Dr. Dayna Barker , who verbally acknowledged these results. IMPRESSION: 2 cm left frontal mass along the upper motor strip. 2 cm left cerebellar mass. Findings are consistent with metastatic disease in this patient with history of lung cancer. The masses have a hemorrhagic appearance with mild edema. No herniation or shift. Electronically Signed   By: Monte Fantasia M.D.   On: 01/18/2017 18:26     EKG: Independently reviewed. Sinus rhythm, QTC 469, low voltage, nonspecific T-wave change.  Assessment/Plan Principal Problem:   Brain metastases ([place]) Active Problems:   Hypothyroidism   HLD (hyperlipidemia)   Gout   Anxiety state   Depression   GERD   Seizure (H[facility])   COPD (chronic obstructive pulmonary disease) (HCC)   Hyperkalemia   ESRD on hemodialysis ([facility])   Squamous cell lung cancer (H[facility])   Right leg weakness   Brain metastases ([place]): Patient's left leg weakness is most likely caused by a metastasized brain disease, as shown by CT head. Patient's mental status is normal.  EDP discussed with  Dr. Kathreen Devoid on call for oncology who accepted for admission, but no bed available now, he recommended decadron 10 mg q6h X 24 hours while waiting transfer.  -will admit to tele bed as inpt. -decadron 10 mg q6h -Frequent neuro check  Hypothyroidism: Last TSH was 2.457 on 12/26/16 -Continue home Synthroid  HLD: -lipitor  Gout: -continue home allopurinol and colchicine  GERD: -Protonix  COPD: stable. -Singulair and breathing treatment  Hyperkalemia: Potassium of 5.2 no EKG change. -Kayexalate 30 g 1  ESRD on hemodialysis (TTS); -left messages to renal box for dialysis  Hx of Squamous cell lung cancer: f/u with Dr. Mindi Junker at [facility]. S/p of  chemo. -will f/u with Dr. Marchia Meiers  Depression and anxiety: Stable, no suicidal or homicidal ideations. -Continue home medications:   DVT ppx: SCD Code Status: Full code Family Communication: None at bed side.    Disposition Plan:  Need to be transfered to [place] called:  none Admission status:Inpatient/tele   Date of Service 01/19/2017    Ivor Costa [facility] Pager [phone]  If 7PM-7AM, please contact night-coverage [email or web address] Password [facility] 01/19/2017, 8:36 AM

## 2017-01-19 DIAGNOSIS — C3490 Malignant neoplasm of unspecified part of unspecified bronchus or lung: Secondary | ICD-10-CM

## 2017-01-19 DIAGNOSIS — F411 Generalized anxiety disorder: Secondary | ICD-10-CM

## 2017-01-19 DIAGNOSIS — K219 Gastro-esophageal reflux disease without esophagitis: Secondary | ICD-10-CM

## 2017-01-19 DIAGNOSIS — E875 Hyperkalemia: Secondary | ICD-10-CM

## 2017-01-19 DIAGNOSIS — R569 Unspecified convulsions: Secondary | ICD-10-CM

## 2017-01-19 DIAGNOSIS — R29898 Other symptoms and signs involving the musculoskeletal system: Secondary | ICD-10-CM

## 2017-01-19 DIAGNOSIS — F329 Major depressive disorder, single episode, unspecified: Secondary | ICD-10-CM

## 2017-01-19 DIAGNOSIS — C7931 Secondary malignant neoplasm of brain: Principal | ICD-10-CM

## 2017-01-19 DIAGNOSIS — E785 Hyperlipidemia, unspecified: Secondary | ICD-10-CM

## 2017-01-19 DIAGNOSIS — M1000 Idiopathic gout, unspecified site: Secondary | ICD-10-CM

## 2017-01-19 DIAGNOSIS — E039 Hypothyroidism, unspecified: Secondary | ICD-10-CM

## 2017-01-19 LAB — RENAL FUNCTION PANEL
Albumin: 3 g/dL — ABNORMAL LOW (ref 3.5–5.0)
Anion gap: 13 (ref 5–15)
BUN: 59 mg/dL — ABNORMAL HIGH (ref 6–20)
CO2: 23 mmol/L (ref 22–32)
Calcium: 8.5 mg/dL — ABNORMAL LOW (ref 8.9–10.3)
Chloride: 98 mmol/L — ABNORMAL LOW (ref 101–111)
Creatinine, Ser: 7.95 mg/dL — ABNORMAL HIGH (ref 0.44–1.00)
GFR calc Af Amer: 5 mL/min — ABNORMAL LOW (ref >60)
GFR calc non Af Amer: 4 mL/min — ABNORMAL LOW (ref >60)
Glucose, Bld: 114 mg/dL — ABNORMAL HIGH (ref 65–99)
Phosphorus: 3.3 mg/dL (ref 2.5–4.6)
Potassium: 5.4 mmol/L — ABNORMAL HIGH (ref 3.5–5.1)
Sodium: 134 mmol/L — ABNORMAL LOW (ref 135–145)

## 2017-01-19 LAB — CBC
HEMATOCRIT: 34.6 % — AB (ref 36.0–46.0)
Hemoglobin: 11.1 g/dL — ABNORMAL LOW (ref 12.0–15.0)
MCH: 30.1 pg (ref 26.0–34.0)
MCHC: 32.1 g/dL (ref 30.0–36.0)
MCV: 93.8 fL (ref 78.0–100.0)
PLATELETS: 286 10*3/uL (ref 150–400)
RBC: 3.69 MIL/uL — ABNORMAL LOW (ref 3.87–5.11)
RDW: 18.7 % — AB (ref 11.5–15.5)
WBC: 7.4 10*3/uL (ref 4.0–10.5)

## 2017-01-19 LAB — PROTIME-INR
INR: 1.11
Prothrombin Time: 14.4 seconds (ref 11.4–15.2)

## 2017-01-19 MED ORDER — SODIUM CHLORIDE 0.9 % IV SOLN: 100.0000 mL | INTRAVENOUS | Status: DC | PRN

## 2017-01-19 MED ORDER — OXYCODONE HCL 5 MG PO TABS: 5.0000 mg | ORAL_TABLET | Freq: Three times a day (TID) | ORAL | 0 refills | Status: DC | PRN

## 2017-01-19 MED ORDER — LIDOCAINE HCL (PF) 1 % IJ SOLN: 5.0000 mL | INTRAMUSCULAR | Status: DC | PRN

## 2017-01-19 MED ORDER — ALTEPLASE 2 MG IJ SOLR
2.0000 mg | Freq: Once | INTRAMUSCULAR | Status: DC | PRN
  Filled 2017-01-19: qty 2

## 2017-01-19 MED ORDER — OXYCODONE HCL 5 MG PO TABS: 5.0000 mg | ORAL_TABLET | Freq: Three times a day (TID) | ORAL | 0 refills | Status: AC | PRN

## 2017-01-19 MED ORDER — PENTAFLUOROPROP-TETRAFLUOROETH EX AERO: 1.0000 "application " | INHALATION_SPRAY | CUTANEOUS | Status: DC | PRN

## 2017-01-19 MED ORDER — HEPARIN SODIUM (PORCINE) 1000 UNIT/ML DIALYSIS
20.0000 [IU]/kg | INTRAMUSCULAR | Status: DC | PRN
  Filled 2017-01-19: qty 2

## 2017-01-19 MED ORDER — DOXERCALCIFEROL 4 MCG/2ML IV SOLN
1.0000 ug | INTRAVENOUS | Status: DC
  Filled 2017-01-19: qty 2

## 2017-01-19 MED ORDER — DEXAMETHASONE SODIUM PHOSPHATE 10 MG/ML IJ SOLN: 10.0000 mg | Freq: Four times a day (QID) | INTRAMUSCULAR | Status: AC

## 2017-01-19 MED ORDER — HEPARIN SODIUM (PORCINE) 1000 UNIT/ML DIALYSIS
1000.0000 [IU] | INTRAMUSCULAR | Status: DC | PRN
  Filled 2017-01-19: qty 1

## 2017-01-19 MED ORDER — LIDOCAINE-PRILOCAINE 2.5-2.5 % EX CREA
1.0000 "application " | TOPICAL_CREAM | CUTANEOUS | Status: DC | PRN
  Filled 2017-01-19: qty 5

## 2017-01-19 NOTE — Progress Notes (Signed)
Report called to Jacqulyn Bath at [facility] is transporting the patient. Here at bedside loading patient for trip.   Vitals are stable and in no active distress.  Belongings [packed for transport and the patient states she will notify her family regarding transfer.  [place], medical necessity for transport, EMS report  And digital copy of head CT sent with [facility].

## 2017-01-19 NOTE — Discharge Summary (Signed)
Physician Discharge Summary  [name] [name] BEM:[id number] DOB: 14-Nov-1940 DOA: 01/18/2017  PCP: Biagio Borg, MD  Admit date: 01/18/2017 Discharge date: 01/19/2017  Time spent: 35 minutes  Recommendations for Outpatient Follow-up:  1. Transfer to [facility] for further treatment of metastatic disease 2. Kept on decadron    Discharge Diagnoses:  Principal Problem:   Brain metastasis ([place]) Active Problems:   Hypothyroidism   HLD (hyperlipidemia)   Gout   Anxiety state   Depression   GERD   Seizure (HCC)   COPD (chronic obstructive pulmonary disease) (HCC)   Hyperkalemia   ESRD on hemodialysis (HCC)   Squamous cell lung cancer (HCC)   Weakness of right lower extremity   Discharge Condition: stable. Will discharge to [facility] for further evaluation and care of metastatic brain disease with new RLE weakness.   Diet recommendation: renal diet   Filed Weights   01/18/17 2207 01/19/17 1315  Weight: 66.7 kg (147 lb) 70.9 kg (156 lb 4.9 oz)    History of present illness:  As per H&P written by Dr. Blaine Hamper on 01/18/17  76 y.o. female with medical history significant of hypertension, hyperlipidemia, diet-controlled diabetes, GERD, hypothyroidism, gout, depression, anxiety, vocal cord paralysis, seizure, RLS, DVT, PUD, ESRD on HD (T/T/S), carcinoma of kidney, anemia, lung cancer (s/p of chemotherapy), who presents with right leg weakness.  Patient states that her symptoms started since yesterday. She has left leg weakness, but no tingling sensations. Her arms are normal. Patient does not have vision change, hearing loss. No slurred speech or facial droop. She denies chest pain, shortness breath, nausea, vomiting, diarrhea, abdominal pain, symptoms of UTI. She states that she has been compliant to hemodialysis. Last dialysis was on Thursday.  ED Course: pt was found to have WBC 8.2, INR 1.10, PTT 37, troponin negative, potassium 5.2, creatinine 6.86, bicarbonate 25, BUN 43,  temperature normal, no tachycardia, oxygen saturation 100% on room air. Patient is admitted to telemetry bed as inpatient. Pt's oncologist is Dr. Mindi Junker at [facility]. EDP discussed with Dr. Kathreen Devoid on call for oncology who accepted for admission, but no bed available now, he recommended decadron 10 mg q6h X 24 hours while waiting transfer.   Hospital Course:  1-brain metastatic disease: presented with new focal RLE weakness. -CT scan with mild surrounding edema  -case discussed with oncologist service actively following patient and recommended to transfer patient to [facility] -while waiting on bed availability/asignment, patient started on decadron 10mg  Q6hrs -further evaluation and treatment to be decided by oncology service at [facility] after transfer.  2-hypothyroidism -will continue synthroid  3-HLD -continue statins  4-GERD -continue PPI  5-Gout -no acute flare -will continue allopurinol and colchicine  6-ESRD -continue HD; patient regimen is T-T-S -last HD provided while inpatient here on 6/16  7-hyperkalemia -corrected with HD and received one dose of kayexalate while in ED -no EKG changes appreciated and patient monitored on telemetry  8-depression and anxiety -no SI or hallucinations -will continue home medication regimen   9-hx of squamous cell lung cancer -follow by Dr. Mindi Junker at [facility]; has completed chemotherapy -now with metastasis seen on CT head -will transfer to [facility] as per oncology recommendations  Procedures:  Patient received HD therapy  -see below for x-ray reports   Consultations:  Oncologist at [facility] (Dr. Kathreen Devoid)   Discharge Exam: Vitals:   01/19/17 1330 01/19/17 1400  BP: (!) 148/83 119/66  Pulse: 83 85  Resp:    Temp:  General: afebrile, cono CP, no SOB. Patient with RLE weakness and poor coordination/easy distractibility. Able to follow commands and in no major distress.  Cardiovascular: S1 and S2, no rubs, no  gallops Respiratory: CTA bilaterally  Abdomen: soft, NT, ND, positive BS Extremities: no edema, no cyanosis; MS 2/5 in her right leg, 5/5 LLE; HD AVF on right arm. Neuro: CN grossly intact, no dysarthria, no drift, oriented X3; except for RLE focal deficit; no further abnormality seen.   Discharge Instructions   Discharge Instructions    Diet - low sodium heart healthy    Complete by:  As directed    Discharge instructions    Complete by:  As directed    Continue IV decadron Will transfer to [facility] for further care, evaluation and treatment of metastatic brain disease. Patient is actively seen by Dr.Petty; accepted by Dr. Kathreen Devoid     Current Discharge Medication List    START taking these medications   Details  dexamethasone (DECADRON) 10 MG/ML injection Inject 1 mL (10 mg total) into the vein every 6 (six) hours.      CONTINUE these medications which have CHANGED   Details  oxyCODONE (OXY IR/ROXICODONE) 5 MG immediate release tablet Take 1 tablet (5 mg total) by mouth every 8 (eight) hours as needed. Qty: 15 tablet, Refills: 0      CONTINUE these medications which have NOT CHANGED   Details  acetaminophen (TYLENOL) 500 MG tablet Take 1,000 mg by mouth 3 (three) times daily.    albuterol (PROVENTIL HFA;VENTOLIN HFA) 108 (90 BASE) MCG/ACT inhaler Inhale 2 puffs into the lungs every 6 (six) hours as needed for wheezing or shortness of breath. Qty: 1 Inhaler, Refills: 5    allopurinol (ZYLOPRIM) 100 MG tablet TAKE ONE TABLET BY MOUTH ONCE DAILY Qty: 90 tablet, Refills: 0    atorvastatin (LIPITOR) 40 MG tablet TAKE ONE TABLET BY MOUTH ONCE DAILY Qty: 30 tablet, Refills: 5    budesonide-formoterol (SYMBICORT) 160-4.5 MCG/ACT inhaler Inhale 2 puffs into the lungs 2 (two) times daily. Wait 1 min in between puffs, rinse mouth after use    cetirizine (ZYRTEC) 10 MG tablet Take 10 mg by mouth daily.    Cholecalciferol (VITAMIN D-3) 5000 units TABS Take 5,000 Units by  mouth daily.     cinacalcet (SENSIPAR) 30 MG tablet Take 30 mg by mouth daily with supper.    clonazePAM (KLONOPIN) 0.5 MG tablet Take 0.5 mg by mouth 2 (two) times daily as needed for anxiety.    colchicine 0.6 MG tablet Take 0.5 tablets (0.3 mg total) by mouth 2 (two) times a week. Qty: 5 tablet, Refills: 0    cyclobenzaprine (FLEXERIL) 10 MG tablet TAKE ONE TABLET BY MOUTH THREE TIMES DAILY AS NEEDED FOR MUSCLE SPASM Qty: 30 tablet, Refills: 3    darbepoetin (ARANESP) 25 MCG/0.42ML SOLN injection Inject 25 mcg into the vein every Thursday. Every Thursday at dialysis     dextromethorphan-guaiFENesin [facility] DM) 30-600 MG 12hr tablet Take 1 tablet by mouth 2 (two) times daily as needed for cough.     doxercalciferol (HECTOROL) 4 MCG/2ML injection Inject 1 mL (2 mcg total) into the vein Every Tuesday,Thursday,and Saturday with dialysis.    DULoxetine (CYMBALTA) 30 MG capsule Take 2 capsules (60 mg total) by mouth daily. Qty: 180 capsule, Refills: 1    hydrOXYzine (ATARAX/VISTARIL) 10 MG tablet Take 20 mg by mouth at bedtime.     Lactulose 20 GM/30ML SOLN Take 20 g by mouth daily as  needed (constipation).     levothyroxine (SYNTHROID, LEVOTHROID) 175 MCG tablet Take 1 tablet (175 mcg total) by mouth daily before breakfast. Qty: 90 tablet, Refills: 3    midodrine (PROAMATINE) 10 MG tablet Take 10 mg by mouth See admin instructions. Take 1 tablet (10 mg) by mouth one hour before dialysis - Tuesday, Thursday, Saturday    montelukast (SINGULAIR) 10 MG tablet TAKE ONE TABLET BY MOUTH AT BEDTIME. SCHEDULE APPOINTMENT FOR YEARLY PHYSICAL WITH LABS. MUST SEE DOCTOR FOR REFILLS Qty: 30 tablet, Refills: 0    multivitamin (RENA-VIT) TABS tablet Take 1 tablet by mouth daily.    Nutritional Supplements (NUTRITIONAL SUPPLEMENT PO) Take 120 mLs by mouth 2 (two) times daily.    omeprazole (PRILOSEC) 40 MG capsule Take 1 capsule (40 mg total) by mouth daily. Qty: 90 capsule, Refills: 1     ondansetron (ZOFRAN) 8 MG tablet Take 1 tablet (8 mg total) by mouth every 6 (six) hours as needed for nausea or vomiting. Qty: 30 tablet, Refills: 1    OVER THE COUNTER MEDICATION Take 1 capsule by mouth See admin instructions. Take 1 capsule Beet Root by mouth daily    polyethylene glycol (MIRALAX / GLYCOLAX) packet Take 17 g by mouth daily as needed for mild constipation. Qty: 14 each, Refills: 0    pregabalin (LYRICA) 50 MG capsule Take 50 mg by mouth at bedtime.     QUEtiapine (SEROQUEL) 100 MG tablet Take 1 tablet (100 mg total) by mouth at bedtime. Qty: 90 tablet, Refills: 0    sevelamer carbonate (RENVELA) 800 MG tablet Take 2,400-3,200 mg by mouth See admin instructions. Take 4 tablets (3200 mg) by mouth three times daily with meals and  3 tablets (2400 mg)  with snacks    Turmeric 500 MG TABS Take 500 mg by mouth daily.      STOP taking these medications     mometasone-formoterol (DULERA) 100-5 MCG/ACT AERO        Allergies  Allergen Reactions  . Cephalosporins Itching and Rash    PATIENT DENIES THIS REACTION - Vanc and fortaz given at the same time in June for several doses at dialysis with systemic rash and itching; received zinacef 7/5 and had worseningsystemicrash/ itching and swelling of eyes - so unclear if allergic to either or both  . Nsaids Other (See Comments)    Renal dysfunction  . Pioglitazone Swelling    PATIENT DENIES THIS REACTON - edema  . Adhesive [Tape] Rash  . Vancomycin Rash    PATIENT DENIES THIS REACTON - See comment under cephalosporin   Follow-up Information    Biagio Borg, MD. Schedule an appointment as soon as possible for a visit in 2 week(s).   Specialties:  Internal Medicine, Radiology Why:  after discharge Contact information: [address] [address] [phone]           The results of significant diagnostics from this hospitalization (including imaging, microbiology, ancillary and laboratory) are  listed below for reference.    Significant Diagnostic Studies: Ct Head Wo Contrast  Result Date: 01/18/2017 CLINICAL DATA:  Weakness. EXAM: CT HEAD WITHOUT CONTRAST TECHNIQUE: Contiguous axial images were obtained from the base of the skull through the vertex without intravenous contrast. COMPARISON:  Brain MRI 11/18/2015 FINDINGS: Brain: 2 cm mass in the peripheral left cerebellum with mild neighboring edema. The mass is mildly hyperdense. High posterior left frontal 2 cm mass that is high density. Mild surrounding edema. No infarct, hydrocephalus,  or shift. There is a background of generalized atrophy. Mild chronic white matter disease. Vascular: Atherosclerotic calcification. Skull: Sclerotic calvarium, likely from end-stage renal disease status. No focal lesion noted. Sinuses/Orbits: Negative Other: These results were called by telephone at the time of interpretation on 01/18/2017 at 6:21 pm to Dr. Dayna Barker , who verbally acknowledged these results. IMPRESSION: 2 cm left frontal mass along the upper motor strip. 2 cm left cerebellar mass. Findings are consistent with metastatic disease in this patient with history of lung cancer. The masses have a hemorrhagic appearance with mild edema. No herniation or shift. Electronically Signed   By: Monte Fantasia M.D.   On: 01/18/2017 18:26   Xr Tibia/fibula Left  Result Date: 01/09/2017 Early signs of consolidation seen. No change in overall position alignment of the left tibia fracture. No hardware failure.   Labs: Basic Metabolic Panel:  Recent Labs Lab 01/18/17 1636 01/18/17 1652 01/19/17 1350  NA 138 139 134*  K 5.2* 5.4* 5.4*  CL 100* 104 98*  CO2 25  --  23  GLUCOSE 119* 120* 114*  BUN 43* 47* 59*  CREATININE 6.86* 6.60* 7.95*  CALCIUM 8.4*  --  8.5*  PHOS  --   --  3.3   Liver Function Tests:  Recent Labs Lab 01/18/17 1636 01/19/17 1350  AST 22  --   ALT 22  --   ALKPHOS 139*  --   BILITOT 1.2  --   PROT 7.4  --   ALBUMIN 3.4*  3.0*   CBC:  Recent Labs Lab 01/18/17 1636 01/18/17 1652 01/19/17 1342  WBC 8.2  --  7.4  NEUTROABS 5.9  --   --   HGB 11.3* 14.3 11.1*  HCT 37.2 42.0 34.6*  MCV 96.9  --  93.8  PLT 250  --  286     Signed:  Barton Dubois MD.  [facility] 01/19/2017, 2:26 PM

## 2017-01-19 NOTE — Procedures (Signed)
  I was present at this dialysis session, have reviewed the session itself and made  appropriate changes Kelly Splinter MD [place] pager [phone]   01/19/2017, 2:40 PM

## 2017-01-19 NOTE — Consult Note (Signed)
[facility] Renal Consultation Note    Indication for Consultation:  Management of ESRD/hemodialysis; anemia, hypertension/volume and secondary hyperparathyroidism PCP:  HPI: [name] [name] is a 76 y.o. female with ESRD on hemodialysis T,Th,S at [facility]. PMH of DMT2, HTN, renal cancer  S/p R nephrectomy, lung cancer, chronic bronchitis, L transmet for osteo/PAD, recent  left tib/fib fx, AOCD, SHPT, seizure disorder, gout, DVT, PUD, GERD, RLS.   Patient states she was at rehab and noticed she was dragging her right leg when she walked. "that was the only thing I noticed!". She was sent from rehab center for evaluation.  CT of head revealed 2 cm L frontal mass along motor strip with hemorrhagic appearance, mild edema. She has recently completed chemotherapy for lung cancer. Plans have been made to transfer to [facility] to Dr. Kathreen Devoid, oncology service. She denies unilateral weakness, slurred speech, blurred vision or diplopia, headaches, syncope, seizure activity, denies N,V,D abdominal pain, chest pain,SOB, palpitations. She says she is anxious, still can't move her R leg but otherwise she feels OK.   Past Medical History:  Diagnosis Date  . ANEMIA-NOS 05/29/2007  . ANXIETY 03/23/2010  . Arthritis    "all over my body"  . Cancer of kidney ([place]) 10/07/2012   Followed per Dr Despina Pole, MD, urology, [place]   . Chronic bronchitis ([place])   . Chronic lower back pain   . Chronic neck pain 12/13/2010  . 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 COMMON MIGRAINE    "stress common migraines"  . Complication of anesthesia    after goiter removed-one vocal cord paralyzed  . Critical lower limb ischemia   . DEPRESSION 02/17/2007  . ESRD (end stage renal disease) on dialysis ([place])    "TTS. [address]" (10/20/2015)  . ESRD on hemodialysis ([place]) 02/17/2007   Started dialysis April 2014.  Gets HD at [facility] on TTS schedule.  Cause of ESRD was HTN.      . GERD (gastroesophageal reflux disease)   . GOUT 05/29/2007  . Heart murmur 10/02/2010   hx  . HYPERLIPIDEMIA 02/17/2007  . HYPERTENSION 02/17/2007  . HYPOTHYROIDISM 02/17/2007   s/p surgical removal of goiter in 1997  . Memory loss 01/24/2010  . OSTEOPENIA 09/22/2009  . Palpitations 09/08/2010  . PEPTIC ULCER DISEASE 05/29/2007   "when I was in college"  . PERIPHERAL NEUROPATHY 05/29/2007  . PERIPHERAL VASCULAR DISEASE 02/17/2007  . Personal history of colonic polyps 11/16/2009  . Renal insufficiency   . RESTLESS LEG SYNDROME 05/29/2007  . SEIZURE DISORDER 02/17/2007  . Type II diabetes mellitus ([place]) 02/17/2007   "haven't had it since ~ 2010" (10/20/2015)  . Vocal cord paralysis 1996   Past Surgical History:  Procedure Laterality Date  . A/V SHUNTOGRAM N/A 10/22/2016   Procedure: A/V Fistulagram - Right Arm;  Surgeon: Angelia Mould, MD;  Location: [place] [facility];  Service: Cardiovascular;  Laterality: N/A;  . AV FISTULA PLACEMENT  03/13/2012   Procedure: ARTERIOVENOUS (AV) FISTULA CREATION;  Surgeon: Conrad [place], MD;  Location: [place];  Service: Vascular;  Laterality: Right;  . BUNIONECTOMY Bilateral 1980  . CATARACT EXTRACTION, BILATERAL Bilateral    bilateral cataract removal  . ELECTROCARDIOGRAM  05/29/2006  . ESOPHAGOSCOPY W/ BOTOX INJECTION  07/22/2012   Procedure: ESOPHAGOSCOPY WITH BOTOX INJECTION;  Surgeon: Rozetta Nunnery, MD;  Location: [place];  Service: ENT;  Laterality: N/A;  esophageal dilation  . INSERTION OF  DIALYSIS CATHETER N/A 02/05/2013   Procedure: INSERTION OF DIALYSIS CATHETER;  Surgeon: Angelia Mould, MD;  Location: [place];  Service: Vascular;  Laterality: N/A;  Ultrasound guided  . LAPAROSCOPIC CHOLECYSTECTOMY    . PERIPHERAL VASCULAR BALLOON ANGIOPLASTY Right 10/22/2016   Procedure: Peripheral Vascular Balloon Angioplasty;  Surgeon: Angelia Mould, MD;  Location: [place] [facility];  Service: Cardiovascular;   Laterality: Right;  AV fistula  . SHOULDER OPEN ROTATOR CUFF REPAIR Left    Dr. Sharol Given  . stress Cardiolite  06/18/2006  . THYROID SURGERY  1997   goiter removal  . TIBIA IM NAIL INSERTION Left 11/18/2016   Procedure: INTRAMEDULLARY (IM) NAIL TIBIAL;  Surgeon: Mcarthur Rossetti, MD;  Location: [place];  Service: Orthopedics;  Laterality: Left;  . TOE AMPUTATION Left 2006  . TOE AMPUTATION Right Aug. 2015   2nd   . tranthoracic echocardiogram  06/18/2006  . VIDEO BRONCHOSCOPY Bilateral 10/25/2015   Procedure: VIDEO BRONCHOSCOPY WITH FLUORO;  Surgeon: Rigoberto Noel, MD;  Location: [place];  Service: Cardiopulmonary;  Laterality: Bilateral;   Family History  Problem Relation Age of Onset  . Dementia Mother   . Hypertension Mother   . Coronary artery disease Other   . Hyperlipidemia Other   . Hypertension Other   . Ovarian cancer Other   . Stroke Other   . Hypertension Sister   . Hypertension Brother   . Heart attack Brother   . Stroke Brother    Social History:  reports that she has quit smoking. Her smoking use included Cigarettes. She has a 12.75 pack-year smoking history. She has never used smokeless tobacco. She reports that she does not drink alcohol or use drugs. Allergies  Allergen Reactions  . Cephalosporins Itching and Rash    PATIENT DENIES THIS REACTION - Vanc and fortaz given at the same time in June for several doses at dialysis with systemic rash and itching; received zinacef 7/5 and had worseningsystemicrash/ itching and swelling of eyes - so unclear if allergic to either or both  . Nsaids Other (See Comments)    Renal dysfunction  . Pioglitazone Swelling    PATIENT DENIES THIS REACTON - edema  . Adhesive [Tape] Rash  . Vancomycin Rash    PATIENT DENIES THIS REACTON - See comment under cephalosporin   Prior to Admission medications   Medication Sig Start Date End Date Taking? Authorizing Provider  acetaminophen (TYLENOL) 500 MG tablet Take 1,000 mg by  mouth 3 (three) times daily.   Yes [provider]  albuterol (PROVENTIL HFA;VENTOLIN HFA) 108 (90 BASE) MCG/ACT inhaler Inhale 2 puffs into the lungs every 6 (six) hours as needed for wheezing or shortness of breath. 06/15/15  Yes Biagio Borg, MD  allopurinol (ZYLOPRIM) 100 MG tablet TAKE ONE TABLET BY MOUTH ONCE DAILY 05/11/16  Yes Biagio Borg, MD  atorvastatin (LIPITOR) 40 MG tablet TAKE ONE TABLET BY MOUTH ONCE DAILY Patient taking differently: TAKE ONE TABLET BY MOUTH ONCE DAILY AT BEDTIME 04/01/15  Yes Biagio Borg, MD  budesonide-formoterol [facility]) 160-4.5 MCG/ACT inhaler Inhale 2 puffs into the lungs 2 (two) times daily. Wait 1 min in between puffs, rinse mouth after use   Yes [provider]  cetirizine (ZYRTEC) 10 MG tablet Take 10 mg by mouth daily.   Yes [provider]  Cholecalciferol (VITAMIN D-3) 5000 units TABS Take 5,000 Units by mouth daily.    Yes [provider]  cinacalcet (SENSIPAR) 30 MG tablet Take  30 mg by mouth daily with supper.   Yes [provider]  clonazePAM (KLONOPIN) 0.5 MG tablet Take 0.5 mg by mouth 2 (two) times daily as needed for anxiety.   Yes [provider]  colchicine 0.6 MG tablet Take 0.5 tablets (0.3 mg total) by mouth 2 (two) times a week. 10/28/15  Yes Dixie Dials, MD  cyclobenzaprine (FLEXERIL) 10 MG tablet TAKE ONE TABLET BY MOUTH THREE TIMES DAILY AS NEEDED FOR MUSCLE SPASM 03/09/16  Yes Biagio Borg, MD  darbepoetin [facility]) 25 MCG/0.42ML SOLN injection Inject 25 mcg into the vein every Thursday. Every Thursday at dialysis    Yes [provider]  dextromethorphan-guaiFENesin [facility] DM) 30-600 MG 12hr tablet Take 1 tablet by mouth 2 (two) times daily as needed for cough.    Yes [provider]  doxercalciferol (HECTOROL) 4 MCG/2ML injection Inject 1 mL (2 mcg total) into the vein Every Tuesday,Thursday,and Saturday with dialysis. 10/28/15  Yes Dixie Dials, MD  DULoxetine  (CYMBALTA) 30 MG capsule Take 2 capsules (60 mg total) by mouth daily. 05/01/16  Yes Biagio Borg, MD  hydrOXYzine (ATARAX/VISTARIL) 10 MG tablet Take 20 mg by mouth at bedtime.    Yes [provider]  Lactulose 20 GM/30ML SOLN Take 20 g by mouth daily as needed (constipation).    Yes [provider]  levothyroxine (SYNTHROID, LEVOTHROID) 175 MCG tablet Take 1 tablet (175 mcg total) by mouth daily before breakfast. 03/02/15  Yes Biagio Borg, MD  midodrine (PROAMATINE) 10 MG tablet Take 10 mg by mouth See admin instructions. Take 1 tablet (10 mg) by mouth one hour before dialysis - Tuesday, Thursday, Saturday   Yes [provider]  mometasone-formoterol (DULERA) 100-5 MCG/ACT AERO Inhale 2 puffs into the lungs 2 (two) times daily.   Yes [provider]  montelukast (SINGULAIR) 10 MG tablet TAKE ONE TABLET BY MOUTH AT BEDTIME. SCHEDULE APPOINTMENT FOR YEARLY PHYSICAL WITH LABS. MUST SEE DOCTOR FOR REFILLS 08/21/16  Yes Biagio Borg, MD  multivitamin (RENA-VIT) TABS tablet Take 1 tablet by mouth daily.   Yes [provider]  Nutritional Supplements (NUTRITIONAL SUPPLEMENT PO) Take 120 mLs by mouth 2 (two) times daily.   Yes [provider]  omeprazole (PRILOSEC) 40 MG capsule Take 1 capsule (40 mg total) by mouth daily. 01/13/16  Yes Biagio Borg, MD  ondansetron (ZOFRAN) 8 MG tablet Take 1 tablet (8 mg total) by mouth every 6 (six) hours as needed for nausea or vomiting. 01/13/16  Yes Biagio Borg, MD  OVER THE COUNTER MEDICATION Take 1 capsule by mouth See admin instructions. Take 1 capsule Beet Root by mouth daily   Yes [provider]  OVER THE COUNTER MEDICATION Take 8 drops by mouth See admin instructions. Cell food - place 8 drops into bottle of water and consume daily   Yes [provider]  oxyCODONE (OXY IR/ROXICODONE) 5 MG immediate release tablet Take 1 tablet (5 mg total) by mouth every 8 (eight) hours as needed. Patient  taking differently: Take 5 mg by mouth every 8 (eight) hours as needed (pain).  12/14/16  Yes Dondra Prader R, NP  polyethylene glycol (MIRALAX / GLYCOLAX) packet Take 17 g by mouth daily as needed for mild constipation. Patient taking differently: Take 17 g by mouth daily as needed for mild constipation. Mix in 8 oz liquid and drink 11/24/16  Yes Lama, Marge Duncans, MD  pregabalin (LYRICA) 50 MG capsule Take 50 mg by mouth  at bedtime.    Yes [provider]  QUEtiapine (SEROQUEL) 100 MG tablet Take 1 tablet (100 mg total) by mouth at bedtime. 05/01/16  Yes Biagio Borg, MD  sevelamer carbonate (RENVELA) 800 MG tablet Take 2,400-3,200 mg by mouth See admin instructions. Take 4 tablets (3200 mg) by mouth three times daily with meals and  3 tablets (2400 mg)  with snacks   Yes [provider]  Turmeric 500 MG TABS Take 500 mg by mouth daily.   Yes [provider]   Current Facility-Administered Medications  Medication Dose Route Frequency Provider Last Rate Last Dose  . acetaminophen (TYLENOL) tablet 650 mg  650 mg Oral Q6H PRN Ivor Costa, MD      . albuterol (PROVENTIL) (2.5 MG/3ML) 0.083% nebulizer solution 3 mL  3 mL Inhalation Q6H PRN Ivor Costa, MD      . allopurinol (ZYLOPRIM) tablet 100 mg  100 mg Oral Daily Ivor Costa, MD   100 mg at 01/19/17 1115  . atorvastatin (LIPITOR) tablet 40 mg  40 mg Oral Q breakfast Ivor Costa, MD   40 mg at 01/19/17 0725  . cholecalciferol (VITAMIN D) tablet 5,000 Units  5,000 Units Oral Daily Ivor Costa, MD   5,000 Units at 01/19/17 1116  . cinacalcet (SENSIPAR) tablet 30 mg  30 mg Oral Q supper Ivor Costa, MD      . clonazePAM [facility]) tablet 0.5 mg  0.5 mg Oral BID PRN Ivor Costa, MD   0.5 mg at 01/19/17 0751  . [START ON 01/21/2017] colchicine tablet 0.3 mg  0.3 mg Oral Once per day on Mon Thu Niu, Xilin, MD      . cyclobenzaprine (FLEXERIL) tablet 10 mg  10 mg Oral TID PRN Ivor Costa, MD      . dexamethasone (DECADRON) injection 10 mg  10  mg Intravenous Q6H Ivor Costa, MD   10 mg at 01/19/17 1116  . dextromethorphan-guaiFENesin (MUCINEX DM) 30-600 MG per 12 hr tablet 1 tablet  1 tablet Oral BID PRN Ivor Costa, MD      . doxercalciferol (HECTOROL) injection 1 mcg  1 mcg Intravenous Q T,Th,Sa-HD Valentina Gu, NP      . DULoxetine (CYMBALTA) DR capsule 60 mg  60 mg Oral Daily Ivor Costa, MD   60 mg at 01/19/17 1115  . feeding supplement (ENSURE ENLIVE) (ENSURE ENLIVE) liquid 237 mL  237 mL Oral BID BM Ivor Costa, MD   237 mL at 01/19/17 1116  . hydrALAZINE (APRESOLINE) injection 5 mg  5 mg Intravenous Q2H PRN Ivor Costa, MD      . hydrOXYzine (ATARAX/VISTARIL) tablet 20 mg  20 mg Oral QHS Ivor Costa, MD   20 mg at 01/18/17 2246  . lactulose (CHRONULAC) 10 GM/15ML solution 20 g  20 g Oral Daily PRN Ivor Costa, MD      . levothyroxine (SYNTHROID, LEVOTHROID) tablet 175 mcg  175 mcg Oral QAC breakfast Ivor Costa, MD   175 mcg at 01/19/17 0725  . loratadine (CLARITIN) tablet 10 mg  10 mg Oral Daily Ivor Costa, MD   10 mg at 01/19/17 1115  . midodrine (PROAMATINE) tablet 10 mg  10 mg Oral Once per day on Tue Thu Sat Ivor Costa, MD   10 mg at 01/19/17 1154  . mometasone-formoterol (DULERA) 200-5 MCG/ACT inhaler 2 puff  2 puff Inhalation BID Ivor Costa, MD   2 puff at 01/19/17 0858  . montelukast (SINGULAIR) tablet 10 mg  10 mg Oral  Gordan Payment, MD   10 mg at 01/18/17 2246  . multivitamin (RENA-VIT) tablet 1 tablet  1 tablet Oral Daily Ivor Costa, MD   1 tablet at 01/19/17 1115  . ondansetron (ZOFRAN) tablet 8 mg  8 mg Oral Q6H PRN Ivor Costa, MD      . oxyCODONE (Oxy IR/ROXICODONE) immediate release tablet 5 mg  5 mg Oral Q8H PRN Ivor Costa, MD   5 mg at 01/19/17 0752  . pantoprazole (PROTONIX) EC tablet 40 mg  40 mg Oral Daily Ivor Costa, MD   40 mg at 01/19/17 1115  . polyethylene glycol (MIRALAX / GLYCOLAX) packet 17 g  17 g Oral Daily PRN Ivor Costa, MD      . pregabalin (LYRICA) capsule 50 mg  50 mg Oral QHS Ivor Costa, MD    50 mg at 01/18/17 2246  . QUEtiapine (SEROQUEL) tablet 100 mg  100 mg Oral QHS Ivor Costa, MD   100 mg at 01/18/17 2246  . sevelamer carbonate (RENVELA) tablet 2,400 mg  2,400 mg Oral PRN Ivor Costa, MD      . sevelamer carbonate (RENVELA) tablet 3,200 mg  3,200 mg Oral TID WC Ivor Costa, MD   3,200 mg at 01/19/17 1200  . sodium chloride flush (NS) 0.9 % injection 3 mL  3 mL Intravenous Q12H Ivor Costa, MD   3 mL at 01/19/17 1116  . sodium polystyrene (KAYEXALATE) 15 GM/60ML suspension 30 g  30 g Oral Once Ivor Costa, MD      . zolpidem (AMBIEN) tablet 5 mg  5 mg Oral QHS PRN Ivor Costa, MD       Labs: Basic Metabolic Panel:  Recent Labs Lab 01/18/17 1636 01/18/17 1652  NA 138 139  K 5.2* 5.4*  CL 100* 104  CO2 25  --   GLUCOSE 119* 120*  BUN 43* 47*  CREATININE 6.86* 6.60*  CALCIUM 8.4*  --    Liver Function Tests:  Recent Labs Lab 01/18/17 1636  AST 22  ALT 22  ALKPHOS 139*  BILITOT 1.2  PROT 7.4  ALBUMIN 3.4*   CBC:  Recent Labs Lab 01/18/17 1636 01/18/17 1652  WBC 8.2  --   NEUTROABS 5.9  --   HGB 11.3* 14.3  HCT 37.2 42.0  MCV 96.9  --   PLT 250  --    Ct Head Wo Contrast  Result Date: 01/18/2017 CLINICAL DATA:  Weakness. EXAM: CT HEAD WITHOUT CONTRAST TECHNIQUE: Contiguous axial images were obtained from the base of the skull through the vertex without intravenous contrast. COMPARISON:  Brain MRI 11/18/2015 FINDINGS: Brain: 2 cm mass in the peripheral left cerebellum with mild neighboring edema. The mass is mildly hyperdense. High posterior left frontal 2 cm mass that is high density. Mild surrounding edema. No infarct, hydrocephalus, or shift. There is a background of generalized atrophy. Mild chronic white matter disease. Vascular: Atherosclerotic calcification. Skull: Sclerotic calvarium, likely from end-stage renal disease status. No focal lesion noted. Sinuses/Orbits: Negative Other: These results were called by telephone at the time of interpretation on  01/18/2017 at 6:21 pm to Dr. Dayna Barker , who verbally acknowledged these results. IMPRESSION: 2 cm left frontal mass along the upper motor strip. 2 cm left cerebellar mass. Findings are consistent with metastatic disease in this patient with history of lung cancer. The masses have a hemorrhagic appearance with mild edema. No herniation or shift. Electronically Signed   By: Monte Fantasia M.D.   On: 01/18/2017 18:26  ROS: As per HPI otherwise negative.   Physical Exam: Vitals:   01/18/17 1925 01/18/17 2207 01/19/17 0651 01/19/17 1235  BP: (!) 158/83 (!) 160/75 (!) 137/58 (!) 139/59  Pulse: 91 87 87   Resp: 12 18 16 18   Temp:  98.1 F (36.7 C) 97.6 F (36.4 C) 99.1 F (37.3 C)  TempSrc:  Oral Oral Oral  SpO2: 97% 98% 100% 100%  Weight:  66.7 kg (147 lb)    Height:  5' 7.5" (1.715 m)       General: Chronically ill appearing female in NAD Head: Normocephalic, atraumatic, sclera non-icteric, mucus membranes are moist Neck: Supple. JVD not elevated. Lungs: Bilateral breath sounds with bibasilar crackles, scattered inspiratory wheezes. No WOB.  Heart: RRR with S1 S2. 2/6 systolic M.  Abdomen: Soft, non-tender, non-distended with normoactive bowel sounds. No rebound/guarding. No obvious abdominal masses. M-S:  Strength and tone appear normal for age. Lower extremities: BLE trace edema L > R.  Neuro: Alert and oriented X 3. Moves all extremities spontaneously. Psych:  Responds to questions appropriately with a normal affect. Dialysis Access: RUA AVF + bruit  Dialysis Orders: [place] T,Th,S 3 hrs 45 minutes 400/800 65 kg 2.0 K/2.5 Ca Linear Na -Heparin 1200 units IV TIW -Mircera 150 mcg IV q 2 weeks (last dose 01/17/17 HGB 10.2 01/17/17) -Venofer 50 mg IV q weekly   Assessment/Plan: 1.  Brain Metastases: Present w/R leg weakness. CT of head revealed 2 cm L frontal mass along motor strip. In the setting recent Lung Ca. Transferring to [facility] for evaluation when bed ready. Started on  decadron.  2.  ESRD -  T,Th,S. HD today on schedule.  3.  Hypertension/volume -BP controlled. Midodrine prior to HD . Pre wt 70.9 kg UFG should be 5 liters. Attempt 4 liters.  4.  Anemia  - HGB 14.3 No ESA.  5.  Metabolic bone disease -  Cont binders, VDRA, sensipar. Renal function panel with HD 6.  Nutrition - Albumin 3.4.Renal/Carb mod diet/renal vit/nepro/prostat. 7.  DM: per primary 8.    COPD. H/O tobacco abuse. Says she has quit smoking. Per primary 9.    Hypothyroidism: Per priimary.   Rita H. Owens Shark, NP-C 01/19/2017, 1:07 PM  [employer] [phone]  Pt seen, examined and agree w A/P as above.  Kelly Splinter MD [employer] pager [phone]   01/19/2017, 2:37 PM

## 2017-01-20 LAB — HEPATITIS B SURFACE ANTIGEN: Hepatitis B Surface Ag: NEGATIVE

## 2017-01-21 ENCOUNTER — Telehealth: Payer: Self-pay | Admitting: *Deleted

## 2017-01-21 NOTE — Telephone Encounter (Signed)
1. Pt was on TCM list admitted 01/18/17 for Brain metastasis. Pt was d/c 6/16 and transferred to [facility] for further treatment of metastatic disease...[name] [name]

## 2017-02-12 ENCOUNTER — Other Ambulatory Visit: Payer: Self-pay | Admitting: Internal Medicine

## 2017-02-13 ENCOUNTER — Other Ambulatory Visit: Payer: Self-pay | Admitting: Internal Medicine

## 2017-02-13 ENCOUNTER — Ambulatory Visit (INDEPENDENT_AMBULATORY_CARE_PROVIDER_SITE_OTHER): Payer: Medicare Other | Admitting: Physician Assistant

## 2017-02-14 ENCOUNTER — Ambulatory Visit (INDEPENDENT_AMBULATORY_CARE_PROVIDER_SITE_OTHER): Payer: Medicare Other | Admitting: Orthopedic Surgery

## 2017-03-05 IMAGING — CR DG CHEST 2V
2 series · 2 of 2 positions shown · non-contrast
Comparison: 10/20/2015

CLINICAL DATA: Hemoptysis and few days ago, history lung cancer

EXAM:
CHEST  2 VIEW

[w chest pa]
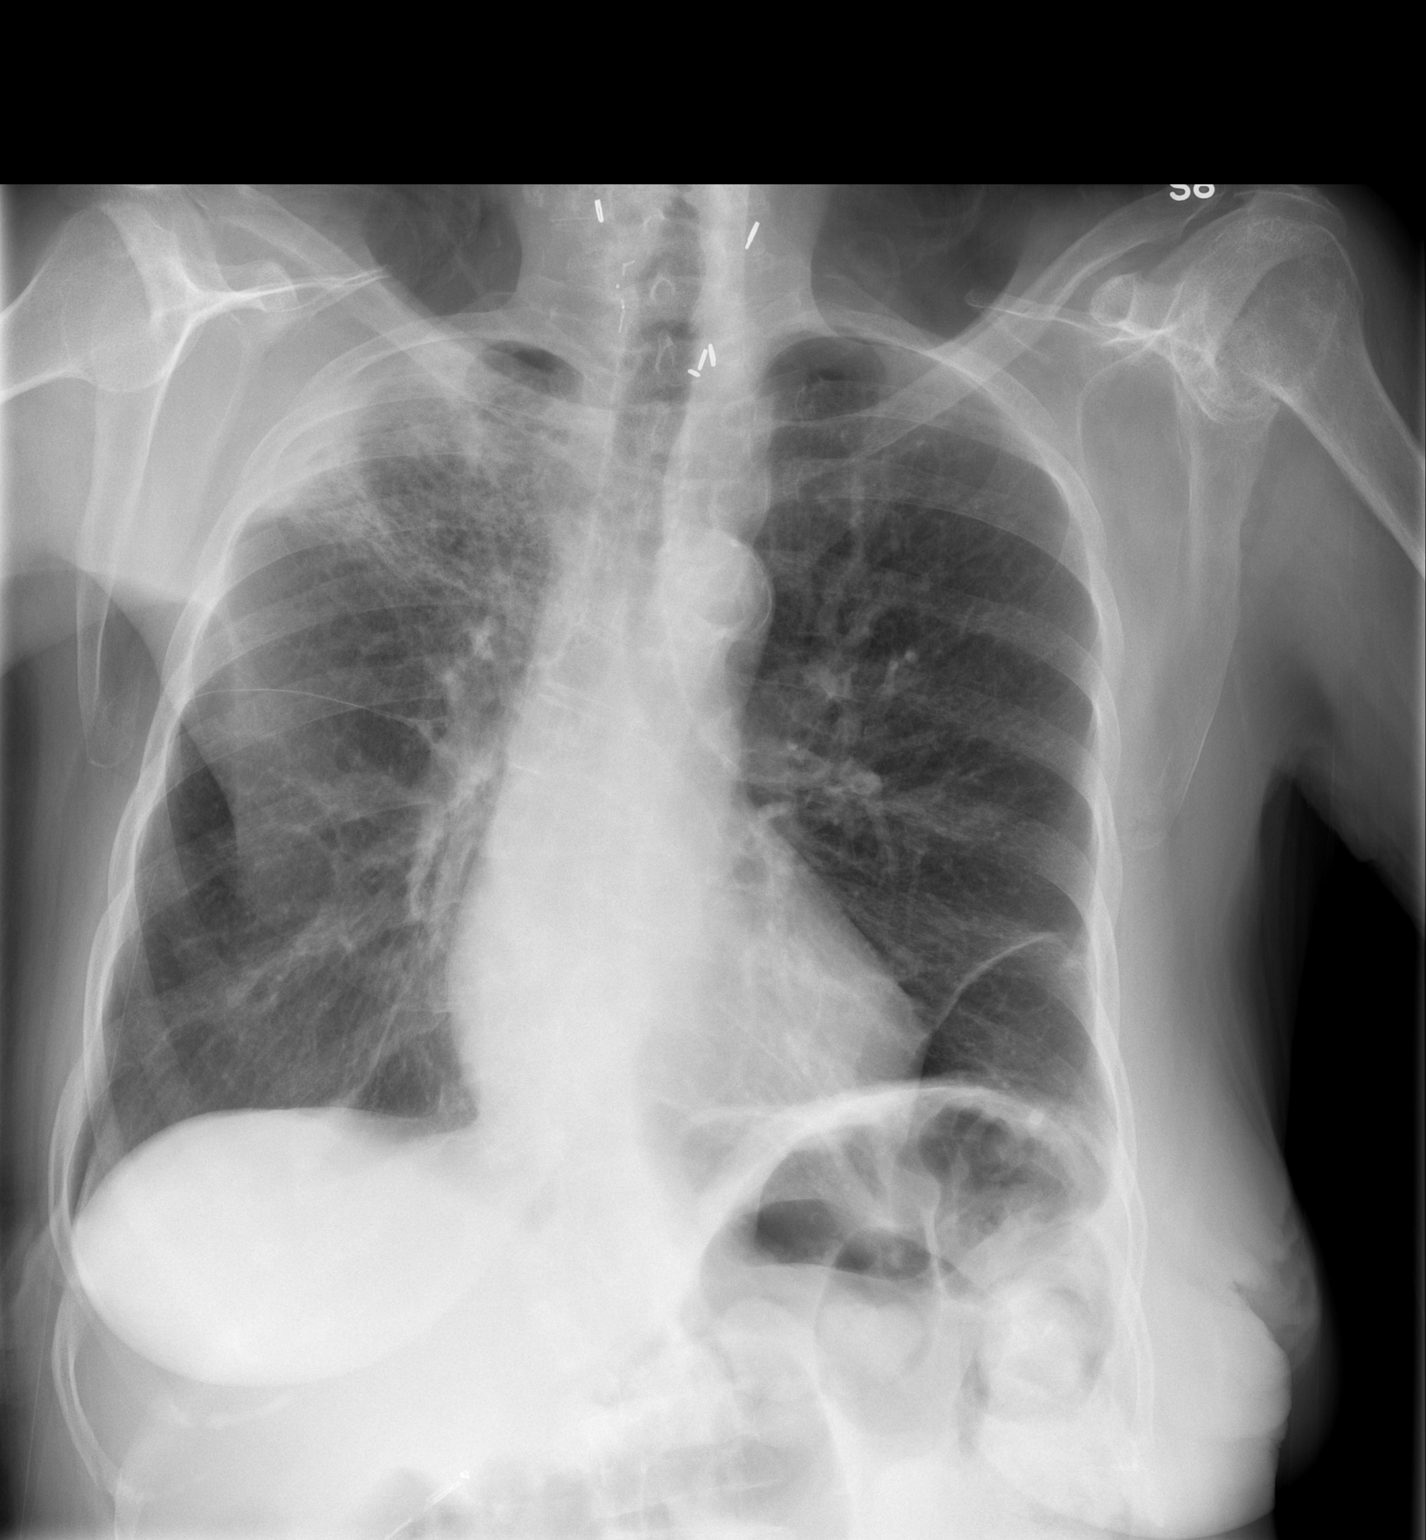

[w chest lat]
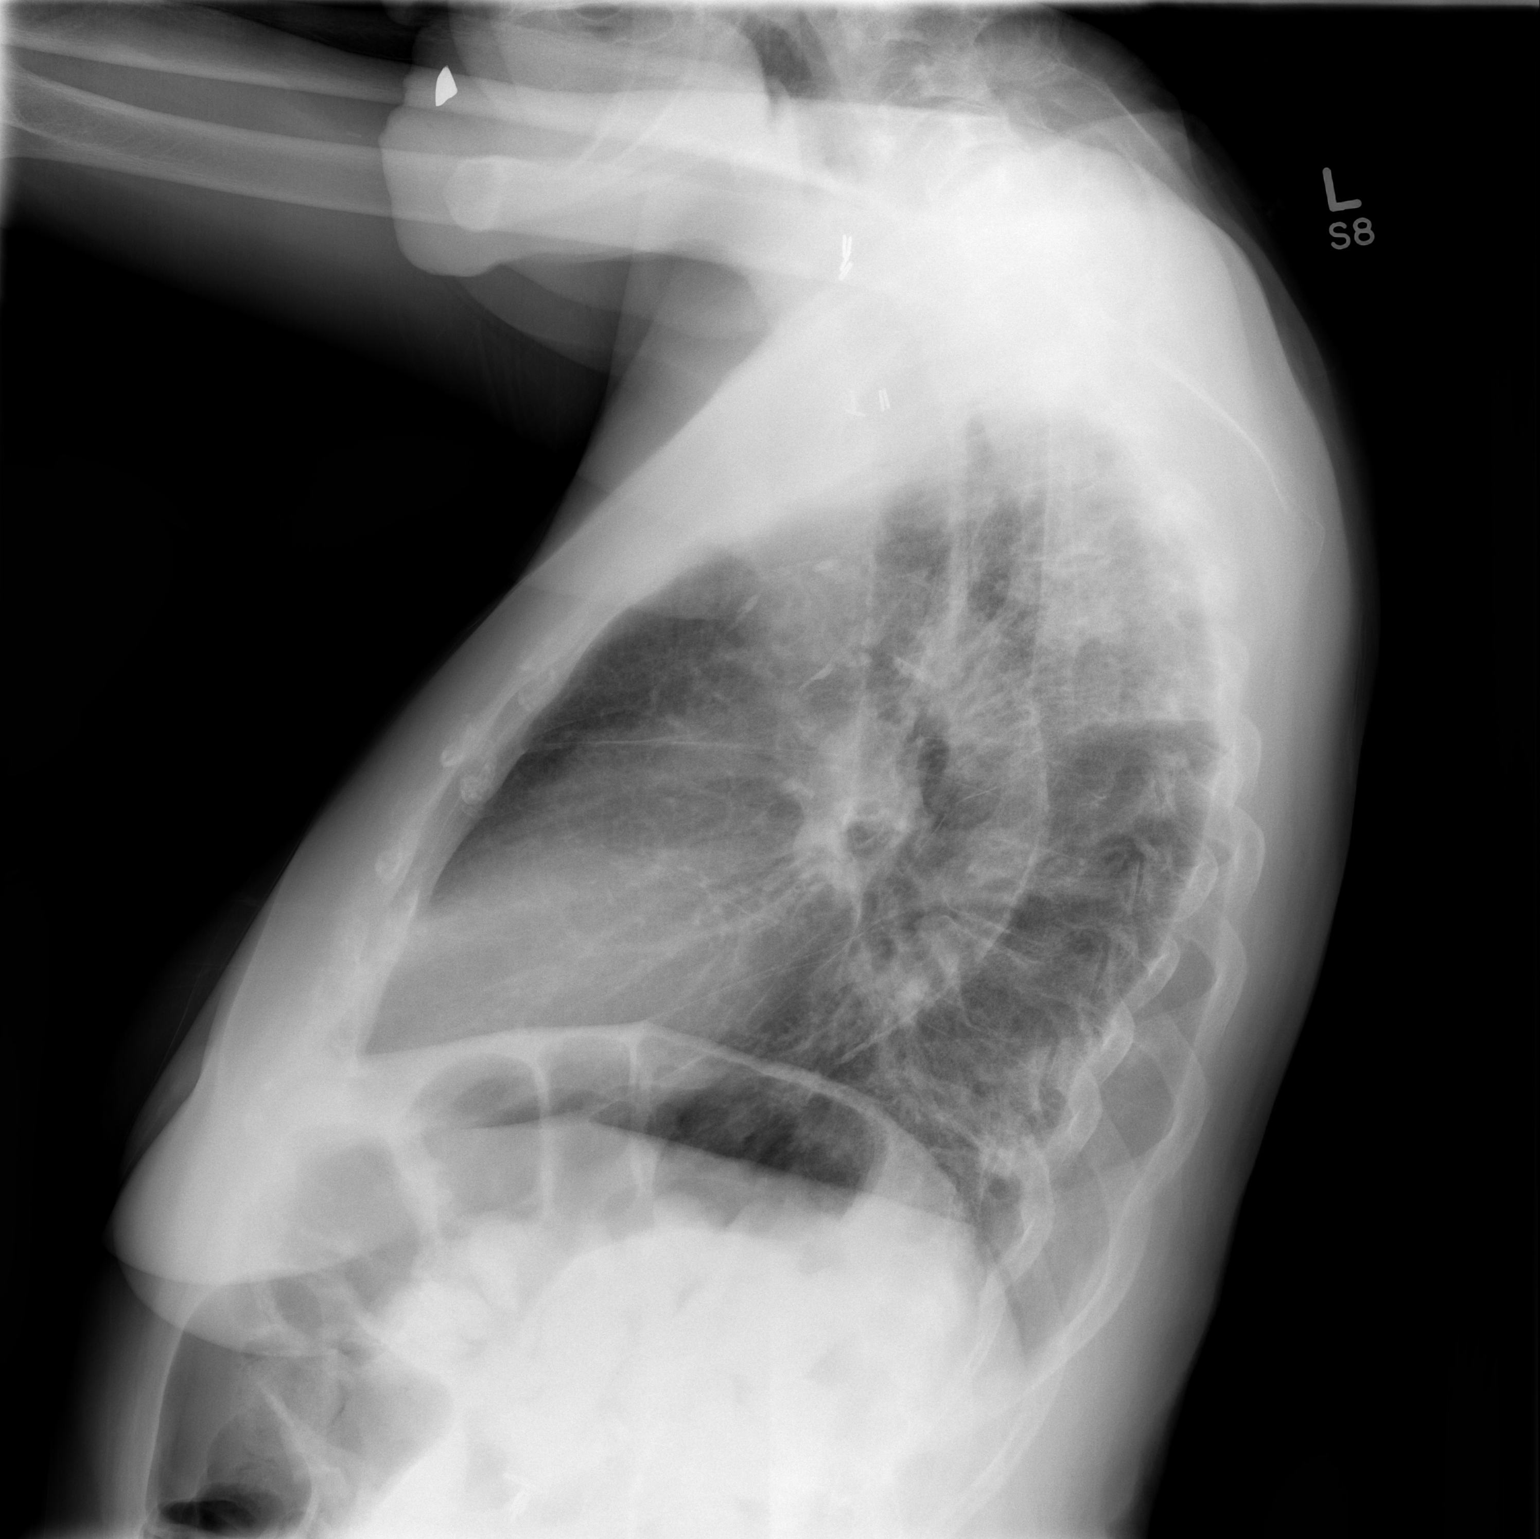

[2 of 2 positions shown; findings below may reference images not displayed]

FINDINGS: Right upper lobe airspace disease concerning for pneumonia. Residual
malignancy is not excluded.

No other focal parenchymal opacity. No pleural effusion or
pneumothorax. Stable cardiomediastinal silhouette.
IMPRESSION: Right upper lobe airspace disease concerning for pneumonia. Residual
malignancy is not excluded.

## 2017-04-05 ENCOUNTER — Telehealth (INDEPENDENT_AMBULATORY_CARE_PROVIDER_SITE_OTHER): Payer: Self-pay | Admitting: Radiology

## 2017-04-05 NOTE — Telephone Encounter (Signed)
Jana Half from SNF called and LMVM yesterday that patient was having leg pain and they did an xray, which showed minimally displaced healing fracture.  They need to know if pt needs to be seen?  What is the WB status? Please call her to discuss, [phone]

## 2017-04-05 NOTE — Telephone Encounter (Signed)
Advise

## 2017-04-05 NOTE — Telephone Encounter (Signed)
Patient has appointment scheduled for 04/18/17 with Dr. Ninfa Linden.

## 2017-04-05 NOTE — Telephone Encounter (Signed)
Can you do me a favor and make her an appointment in a couple weeks

## 2017-04-05 NOTE — Telephone Encounter (Signed)
We last saw her in June.  She can be weight bearing as tolerated at this point and we do need to see her sometime in the next 1-2 weeks.

## 2017-04-18 ENCOUNTER — Ambulatory Visit (INDEPENDENT_AMBULATORY_CARE_PROVIDER_SITE_OTHER): Payer: Medicare Other | Admitting: Orthopaedic Surgery

## 2017-05-02 ENCOUNTER — Ambulatory Visit (INDEPENDENT_AMBULATORY_CARE_PROVIDER_SITE_OTHER): Payer: Medicare Other | Admitting: Orthopaedic Surgery

## 2017-08-28 IMAGING — CR DG CHEST 1V PORT
1 series · 1 of 1 positions shown · non-contrast
Comparison: 11/16/2016, 05/29/2016

CLINICAL DATA: Hypoxemia

EXAM:
PORTABLE CHEST 1 VIEW

[AP]
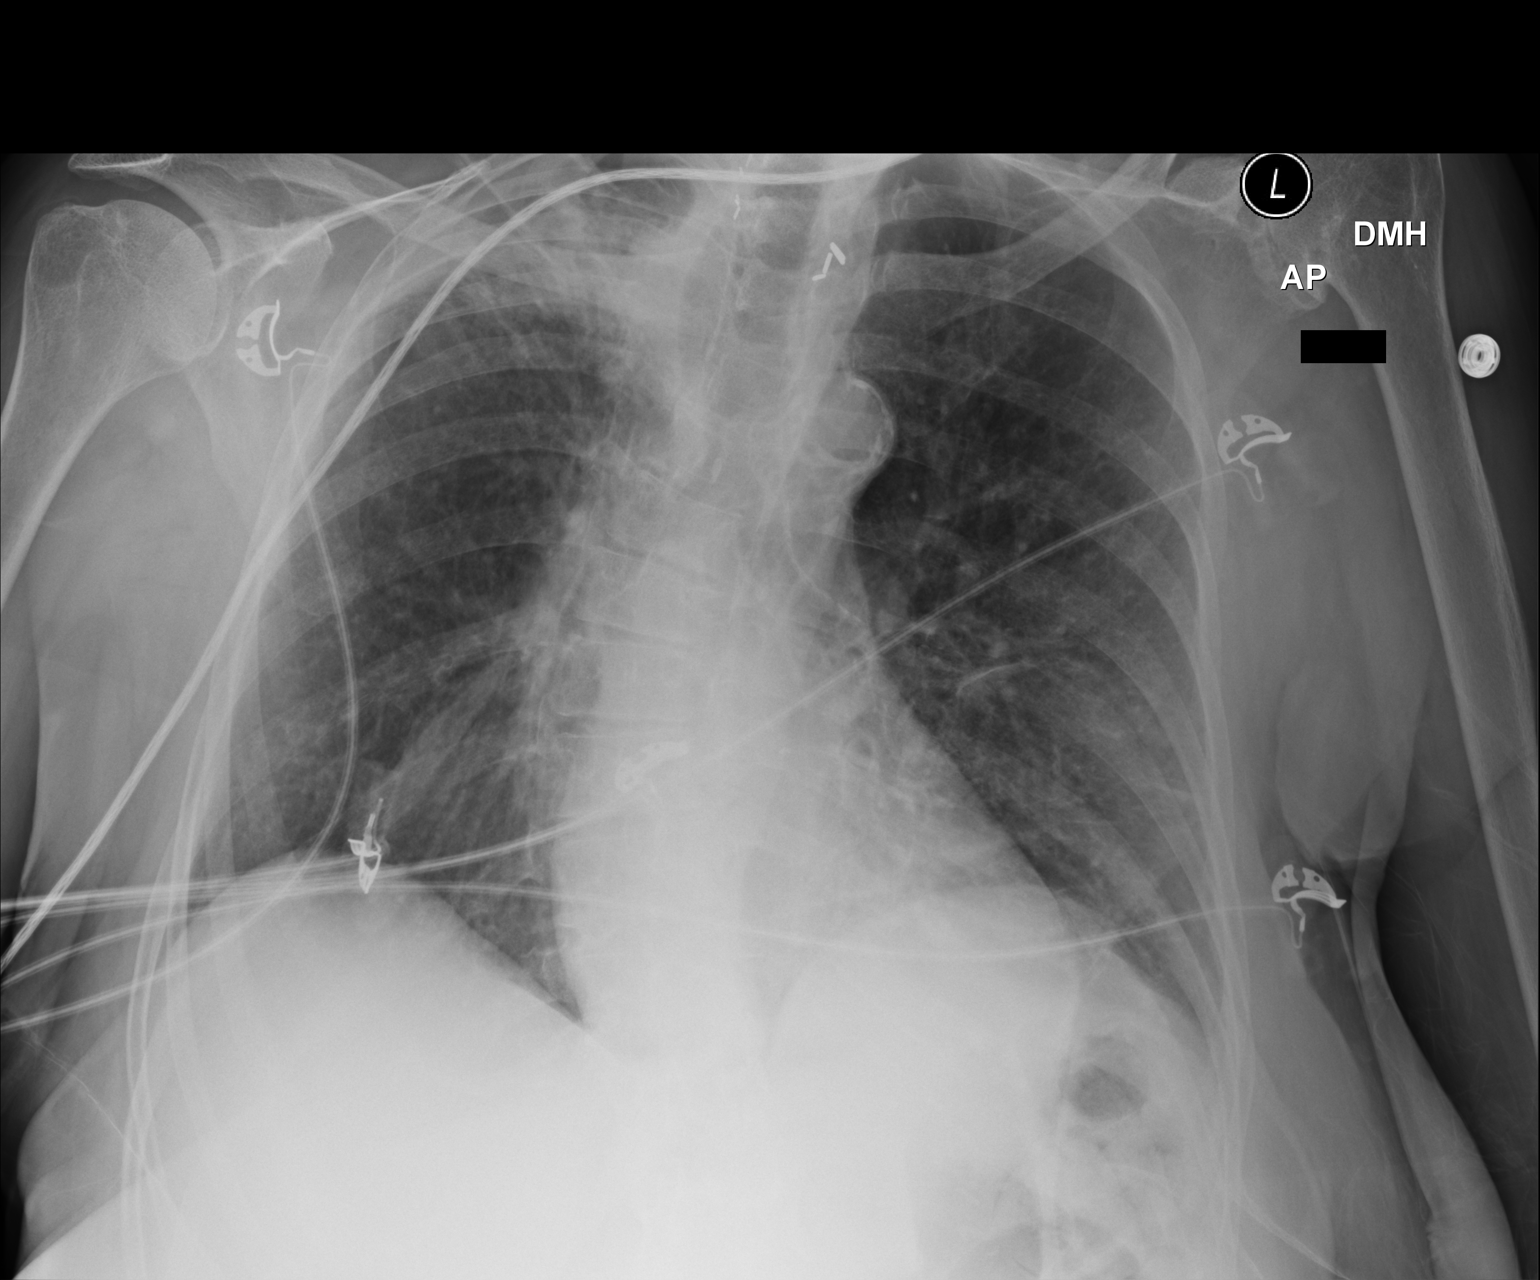

[1 of 1 positions shown; findings below may reference images not displayed]

FINDINGS: Cardiac shadow is stable. Aortic calcifications are again seen.
Chronic scarring in the right apex is again seen stable from the
prior exam consistent with the known history of radiation therapy.
No focal infiltrate or sizable effusion is noted. No acute bony
abnormality is seen.
IMPRESSION: Chronic changes in the right apex.  No acute abnormality noted.

## 2017-12-03 ENCOUNTER — Encounter: Payer: Self-pay | Admitting: Gastroenterology

## 2018-07-06 DEATH — deceased

## 2018-11-04 ENCOUNTER — Encounter (HOSPITAL_COMMUNITY): Payer: Self-pay | Admitting: *Deleted
